# Patient Record
Sex: Male | Born: 1945
Health system: Southern US, Community
[De-identification: ages and names within clinical notes are randomized; demographics above are authoritative.]

## PROBLEM LIST (undated history)

## (undated) DIAGNOSIS — I509 Heart failure, unspecified: Secondary | ICD-10-CM

## (undated) DIAGNOSIS — E785 Hyperlipidemia, unspecified: Secondary | ICD-10-CM

## (undated) DIAGNOSIS — I10 Essential (primary) hypertension: Secondary | ICD-10-CM

## (undated) DIAGNOSIS — I4891 Unspecified atrial fibrillation: Secondary | ICD-10-CM

## (undated) DIAGNOSIS — D649 Anemia, unspecified: Secondary | ICD-10-CM

## (undated) DIAGNOSIS — N259 Disorder resulting from impaired renal tubular function, unspecified: Secondary | ICD-10-CM

## (undated) DIAGNOSIS — E119 Type 2 diabetes mellitus without complications: Secondary | ICD-10-CM

## (undated) DIAGNOSIS — I639 Cerebral infarction, unspecified: Secondary | ICD-10-CM

## (undated) DIAGNOSIS — H269 Unspecified cataract: Secondary | ICD-10-CM

## (undated) DIAGNOSIS — N184 Chronic kidney disease, stage 4 (severe): Secondary | ICD-10-CM

## (undated) HISTORY — DX: Unspecified cataract: H26.9

## (undated) HISTORY — DX: Cerebral infarction, unspecified: I63.9

## (undated) HISTORY — DX: Chronic kidney disease, stage 4 (severe): N18.4

## (undated) HISTORY — DX: Hyperlipidemia, unspecified: E78.5

## (undated) HISTORY — DX: Essential (primary) hypertension: I10

## (undated) HISTORY — PX: SHOULDER SURGERY: SHX246

## (undated) HISTORY — DX: Anemia, unspecified: D64.9

## (undated) HISTORY — PX: TRANSURETHRAL RESECTION OF PROSTATE: SHX73

## (undated) HISTORY — DX: Disorder resulting from impaired renal tubular function, unspecified: N25.9

## (undated) HISTORY — DX: Type 2 diabetes mellitus without complications: E11.9

---

## 2002-01-01 ENCOUNTER — Encounter: Admission: RE | Admit: 2002-01-01 | Discharge: 2002-04-01 | Payer: Self-pay | Admitting: Internal Medicine

## 2004-06-15 ENCOUNTER — Ambulatory Visit: Payer: Self-pay | Admitting: Internal Medicine

## 2004-06-22 ENCOUNTER — Ambulatory Visit: Payer: Self-pay | Admitting: Internal Medicine

## 2004-08-23 ENCOUNTER — Ambulatory Visit: Payer: Self-pay | Admitting: Internal Medicine

## 2004-08-30 ENCOUNTER — Ambulatory Visit: Payer: Self-pay | Admitting: Internal Medicine

## 2004-11-22 ENCOUNTER — Ambulatory Visit: Payer: Self-pay | Admitting: Internal Medicine

## 2004-11-30 ENCOUNTER — Ambulatory Visit: Payer: Self-pay | Admitting: Internal Medicine

## 2005-08-11 ENCOUNTER — Ambulatory Visit: Payer: Self-pay | Admitting: Internal Medicine

## 2005-08-24 ENCOUNTER — Ambulatory Visit: Payer: Self-pay | Admitting: Internal Medicine

## 2005-11-04 ENCOUNTER — Ambulatory Visit: Payer: Self-pay | Admitting: Internal Medicine

## 2005-11-14 ENCOUNTER — Ambulatory Visit: Payer: Self-pay

## 2005-11-16 ENCOUNTER — Ambulatory Visit: Payer: Self-pay | Admitting: Internal Medicine

## 2006-02-02 ENCOUNTER — Ambulatory Visit: Payer: Self-pay | Admitting: Internal Medicine

## 2006-02-14 ENCOUNTER — Ambulatory Visit: Payer: Self-pay | Admitting: Internal Medicine

## 2006-03-10 ENCOUNTER — Ambulatory Visit: Payer: Self-pay | Admitting: Internal Medicine

## 2006-03-16 ENCOUNTER — Ambulatory Visit: Payer: Self-pay | Admitting: Internal Medicine

## 2006-06-13 ENCOUNTER — Ambulatory Visit: Payer: Self-pay | Admitting: Internal Medicine

## 2006-06-13 LAB — CONVERTED CEMR LAB
ALT: 22 units/L (ref 0–40)
AST: 22 units/L (ref 0–37)
Albumin: 4.1 g/dL (ref 3.5–5.2)
Alkaline Phosphatase: 46 units/L (ref 39–117)
BUN: 16 mg/dL (ref 6–23)
Bilirubin, Direct: 0.1 mg/dL (ref 0.0–0.3)
CO2: 30 meq/L (ref 19–32)
Calcium: 10 mg/dL (ref 8.4–10.5)
Chloride: 101 meq/L (ref 96–112)
Chol/HDL Ratio, serum: 7
Cholesterol: 292 mg/dL (ref 0–200)
Creatinine, Ser: 1.6 mg/dL — ABNORMAL HIGH (ref 0.4–1.5)
GFR calc non Af Amer: 47 mL/min
Glomerular Filtration Rate, Af Am: 57 mL/min/{1.73_m2}
Glucose, Bld: 251 mg/dL — ABNORMAL HIGH (ref 70–99)
HDL: 42 mg/dL (ref >39.0)
Hgb A1c MFr Bld: 7.7 % — ABNORMAL HIGH (ref 4.6–6.0)
LDL DIRECT: 155.3 mg/dL
Potassium: 3.8 meq/L (ref 3.5–5.1)
Sodium: 139 meq/L (ref 135–145)
Total Bilirubin: 0.9 mg/dL (ref 0.3–1.2)
Total Protein: 6.8 g/dL (ref 6.0–8.3)
Triglyceride fasting, serum: 323 mg/dL (ref 0–149)
VLDL: 65 mg/dL — ABNORMAL HIGH (ref 0–40)

## 2006-06-20 ENCOUNTER — Ambulatory Visit: Payer: Self-pay | Admitting: Internal Medicine

## 2006-07-26 ENCOUNTER — Ambulatory Visit: Payer: Self-pay | Admitting: Internal Medicine

## 2006-07-26 LAB — CONVERTED CEMR LAB
ALT: 22 units/L (ref 0–40)
AST: 29 units/L (ref 0–37)
Albumin: 3.9 g/dL (ref 3.5–5.2)
Alkaline Phosphatase: 49 units/L (ref 39–117)
BUN: 11 mg/dL (ref 6–23)
Bilirubin, Direct: 0.1 mg/dL (ref 0.0–0.3)
CO2: 30 meq/L (ref 19–32)
Calcium: 9.7 mg/dL (ref 8.4–10.5)
Chloride: 103 meq/L (ref 96–112)
Cholesterol: 257 mg/dL (ref 0–200)
Creatinine, Ser: 1.4 mg/dL (ref 0.4–1.5)
Direct LDL: 150.7 mg/dL
GFR calc Af Amer: 67 mL/min
GFR calc non Af Amer: 55 mL/min
Glucose, Bld: 199 mg/dL — ABNORMAL HIGH (ref 70–99)
HDL: 37.8 mg/dL — ABNORMAL LOW (ref >39.0)
Potassium: 4 meq/L (ref 3.5–5.1)
Sodium: 140 meq/L (ref 135–145)
Total Bilirubin: 0.7 mg/dL (ref 0.3–1.2)
Total CHOL/HDL Ratio: 6.8
Total Protein: 6.6 g/dL (ref 6.0–8.3)
Triglycerides: 214 mg/dL (ref 0–149)
VLDL: 43 mg/dL — ABNORMAL HIGH (ref 0–40)

## 2006-08-02 ENCOUNTER — Ambulatory Visit: Payer: Self-pay | Admitting: Internal Medicine

## 2006-08-08 ENCOUNTER — Ambulatory Visit: Payer: Self-pay | Admitting: Internal Medicine

## 2006-08-10 ENCOUNTER — Ambulatory Visit: Payer: Self-pay | Admitting: Internal Medicine

## 2006-08-15 ENCOUNTER — Ambulatory Visit: Payer: Self-pay | Admitting: Internal Medicine

## 2006-08-18 ENCOUNTER — Ambulatory Visit: Payer: Self-pay | Admitting: Gastroenterology

## 2006-08-21 ENCOUNTER — Ambulatory Visit: Payer: Self-pay | Admitting: Endocrinology

## 2006-09-01 ENCOUNTER — Ambulatory Visit: Payer: Self-pay | Admitting: Gastroenterology

## 2006-09-01 ENCOUNTER — Encounter (INDEPENDENT_AMBULATORY_CARE_PROVIDER_SITE_OTHER): Payer: Self-pay | Admitting: Specialist

## 2006-09-01 LAB — HM COLONOSCOPY: HM Colonoscopy: NORMAL

## 2006-09-05 ENCOUNTER — Ambulatory Visit: Payer: Self-pay | Admitting: Internal Medicine

## 2006-09-06 ENCOUNTER — Encounter: Payer: Self-pay | Admitting: Internal Medicine

## 2007-01-02 DIAGNOSIS — I10 Essential (primary) hypertension: Secondary | ICD-10-CM

## 2007-01-02 DIAGNOSIS — E119 Type 2 diabetes mellitus without complications: Secondary | ICD-10-CM

## 2007-01-02 DIAGNOSIS — I129 Hypertensive chronic kidney disease with stage 1 through stage 4 chronic kidney disease, or unspecified chronic kidney disease: Secondary | ICD-10-CM

## 2007-01-02 HISTORY — DX: Type 2 diabetes mellitus without complications: E11.9

## 2007-01-02 HISTORY — DX: Essential (primary) hypertension: I10

## 2007-03-15 ENCOUNTER — Encounter: Payer: Self-pay | Admitting: Endocrinology

## 2007-04-04 ENCOUNTER — Encounter: Payer: Self-pay | Admitting: Internal Medicine

## 2007-04-12 ENCOUNTER — Ambulatory Visit: Payer: Self-pay | Admitting: Internal Medicine

## 2007-05-21 ENCOUNTER — Ambulatory Visit (HOSPITAL_BASED_OUTPATIENT_CLINIC_OR_DEPARTMENT_OTHER): Admission: RE | Admit: 2007-05-21 | Discharge: 2007-05-21 | Payer: Self-pay | Admitting: Urology

## 2007-05-21 ENCOUNTER — Encounter (INDEPENDENT_AMBULATORY_CARE_PROVIDER_SITE_OTHER): Payer: Self-pay | Admitting: Urology

## 2007-06-18 ENCOUNTER — Ambulatory Visit: Payer: Self-pay | Admitting: Internal Medicine

## 2007-06-18 DIAGNOSIS — E785 Hyperlipidemia, unspecified: Secondary | ICD-10-CM

## 2007-06-18 HISTORY — DX: Hyperlipidemia, unspecified: E78.5

## 2007-06-18 LAB — CONVERTED CEMR LAB
ALT: 17 units/L (ref 0–53)
AST: 19 units/L (ref 0–37)
Albumin: 3.9 g/dL (ref 3.5–5.2)
Alkaline Phosphatase: 46 units/L (ref 39–117)
BUN: 17 mg/dL (ref 6–23)
Bilirubin, Direct: 0.1 mg/dL (ref 0.0–0.3)
CO2: 31 meq/L (ref 19–32)
Calcium: 9.7 mg/dL (ref 8.4–10.5)
Chloride: 99 meq/L (ref 96–112)
Cholesterol: 286 mg/dL (ref 0–200)
Creatinine, Ser: 1.6 mg/dL — ABNORMAL HIGH (ref 0.4–1.5)
Creatinine,U: 111.2 mg/dL
Direct LDL: 146.7 mg/dL
GFR calc Af Amer: 57 mL/min
GFR calc non Af Amer: 47 mL/min
Glucose, Bld: 306 mg/dL — ABNORMAL HIGH (ref 70–99)
HDL: 37.9 mg/dL — ABNORMAL LOW (ref >39.0)
Hgb A1c MFr Bld: 9.2 % — ABNORMAL HIGH (ref 4.6–6.0)
Microalb Creat Ratio: 6.3 mg/g (ref 0.0–30.0)
Microalb, Ur: 0.7 mg/dL (ref 0.0–1.9)
Potassium: 3.8 meq/L (ref 3.5–5.1)
Sodium: 138 meq/L (ref 135–145)
Total Bilirubin: 0.8 mg/dL (ref 0.3–1.2)
Total CHOL/HDL Ratio: 7.5
Total Protein: 6.7 g/dL (ref 6.0–8.3)
Triglycerides: 344 mg/dL (ref 0–149)
VLDL: 69 mg/dL — ABNORMAL HIGH (ref 0–40)

## 2007-06-25 ENCOUNTER — Ambulatory Visit: Payer: Self-pay | Admitting: Internal Medicine

## 2007-09-17 ENCOUNTER — Encounter: Payer: Self-pay | Admitting: Family Medicine

## 2007-09-18 ENCOUNTER — Ambulatory Visit: Payer: Self-pay | Admitting: Internal Medicine

## 2007-09-18 LAB — CONVERTED CEMR LAB
ALT: 19 units/L (ref 0–53)
AST: 19 units/L (ref 0–37)
Albumin: 3.3 g/dL — ABNORMAL LOW (ref 3.5–5.2)
Alkaline Phosphatase: 61 units/L (ref 39–117)
BUN: 36 mg/dL — ABNORMAL HIGH (ref 6–23)
Bilirubin, Direct: 0.2 mg/dL (ref 0.0–0.3)
CO2: 31 meq/L (ref 19–32)
Calcium: 9.1 mg/dL (ref 8.4–10.5)
Chloride: 100 meq/L (ref 96–112)
Cholesterol: 205 mg/dL (ref 0–200)
Creatinine, Ser: 1.9 mg/dL — ABNORMAL HIGH (ref 0.4–1.5)
Direct LDL: 135.3 mg/dL
GFR calc Af Amer: 47 mL/min
GFR calc non Af Amer: 39 mL/min
Glucose, Bld: 203 mg/dL — ABNORMAL HIGH (ref 70–99)
HDL: 30 mg/dL — ABNORMAL LOW (ref >39.0)
Hgb A1c MFr Bld: 9 % — ABNORMAL HIGH (ref 4.6–6.0)
Potassium: 3.9 meq/L (ref 3.5–5.1)
Sodium: 139 meq/L (ref 135–145)
Total Bilirubin: 1 mg/dL (ref 0.3–1.2)
Total CHOL/HDL Ratio: 6.8
Total Protein: 6.7 g/dL (ref 6.0–8.3)
Triglycerides: 129 mg/dL (ref 0–149)
VLDL: 26 mg/dL (ref 0–40)

## 2007-09-25 ENCOUNTER — Ambulatory Visit: Payer: Self-pay | Admitting: Internal Medicine

## 2008-01-29 ENCOUNTER — Ambulatory Visit: Payer: Self-pay | Admitting: Internal Medicine

## 2008-01-30 LAB — CONVERTED CEMR LAB
Albumin: 3.5 g/dL (ref 3.5–5.2)
BUN: 35 mg/dL — ABNORMAL HIGH (ref 6–23)
Cholesterol: 212 mg/dL (ref 0–200)
Creatinine, Ser: 1.9 mg/dL — ABNORMAL HIGH (ref 0.4–1.5)
GFR calc Af Amer: 47 mL/min
GFR calc non Af Amer: 38 mL/min
HDL: 30.6 mg/dL — ABNORMAL LOW (ref >39.0)
Hgb A1c MFr Bld: 11.9 % — ABNORMAL HIGH (ref 4.6–6.0)
Potassium: 4 meq/L (ref 3.5–5.1)
Total CHOL/HDL Ratio: 6.9
VLDL: 51 mg/dL — ABNORMAL HIGH (ref 0–40)

## 2008-02-05 ENCOUNTER — Ambulatory Visit: Payer: Self-pay | Admitting: Internal Medicine

## 2008-02-05 DIAGNOSIS — N259 Disorder resulting from impaired renal tubular function, unspecified: Secondary | ICD-10-CM

## 2008-02-05 HISTORY — DX: Disorder resulting from impaired renal tubular function, unspecified: N25.9

## 2008-03-24 ENCOUNTER — Telehealth: Payer: Self-pay | Admitting: Internal Medicine

## 2008-05-26 ENCOUNTER — Ambulatory Visit: Payer: Self-pay | Admitting: Internal Medicine

## 2008-05-26 LAB — CONVERTED CEMR LAB
ALT: 11 units/L (ref 0–53)
BUN: 26 mg/dL — ABNORMAL HIGH (ref 6–23)
CO2: 31 meq/L (ref 19–32)
Calcium: 9.4 mg/dL (ref 8.4–10.5)
Cholesterol: 270 mg/dL (ref 0–200)
Creatinine, Ser: 1.9 mg/dL — ABNORMAL HIGH (ref 0.4–1.5)
Direct LDL: 146.7 mg/dL
Glucose, Bld: 160 mg/dL — ABNORMAL HIGH (ref 70–99)
Hgb A1c MFr Bld: 6.9 % — ABNORMAL HIGH (ref 4.6–6.0)
Total Protein: 6.9 g/dL (ref 6.0–8.3)
Triglycerides: 161 mg/dL — ABNORMAL HIGH (ref 0–149)

## 2008-06-11 ENCOUNTER — Ambulatory Visit: Payer: Self-pay | Admitting: Internal Medicine

## 2008-10-14 ENCOUNTER — Ambulatory Visit: Payer: Self-pay | Admitting: Internal Medicine

## 2008-10-14 LAB — CONVERTED CEMR LAB
Alkaline Phosphatase: 49 units/L (ref 39–117)
BUN: 27 mg/dL — ABNORMAL HIGH (ref 6–23)
Bilirubin, Direct: 0.1 mg/dL (ref 0.0–0.3)
Chloride: 108 meq/L (ref 96–112)
Creatinine, Ser: 2 mg/dL — ABNORMAL HIGH (ref 0.4–1.5)
Hgb A1c MFr Bld: 7.1 % — ABNORMAL HIGH (ref 4.6–6.5)
Total Protein: 7 g/dL (ref 6.0–8.3)
VLDL: 51.6 mg/dL — ABNORMAL HIGH (ref 0.0–40.0)

## 2008-10-21 ENCOUNTER — Ambulatory Visit: Payer: Self-pay | Admitting: Internal Medicine

## 2009-02-19 ENCOUNTER — Ambulatory Visit: Payer: Self-pay | Admitting: Internal Medicine

## 2009-02-19 LAB — CONVERTED CEMR LAB
ALT: 15 units/L (ref 0–53)
BUN: 35 mg/dL — ABNORMAL HIGH (ref 6–23)
Bilirubin, Direct: 0 mg/dL (ref 0.0–0.3)
CO2: 30 meq/L (ref 19–32)
Chloride: 102 meq/L (ref 96–112)
Glucose, Bld: 150 mg/dL — ABNORMAL HIGH (ref 70–99)
HDL: 33.1 mg/dL — ABNORMAL LOW (ref >39.00)
Potassium: 3.5 meq/L (ref 3.5–5.1)
Total Bilirubin: 0.9 mg/dL (ref 0.3–1.2)
VLDL: 39.4 mg/dL (ref 0.0–40.0)

## 2009-02-26 ENCOUNTER — Ambulatory Visit: Payer: Self-pay | Admitting: Internal Medicine

## 2009-03-10 ENCOUNTER — Telehealth: Payer: Self-pay | Admitting: Internal Medicine

## 2009-05-14 ENCOUNTER — Ambulatory Visit: Payer: Self-pay | Admitting: Internal Medicine

## 2009-05-14 LAB — CONVERTED CEMR LAB
Bilirubin Urine: NEGATIVE
Ketones, urine, test strip: NEGATIVE
Protein, U semiquant: >=300
Urobilinogen, UA: 0.2

## 2009-05-18 ENCOUNTER — Telehealth: Payer: Self-pay | Admitting: Internal Medicine

## 2009-05-19 ENCOUNTER — Ambulatory Visit: Payer: Self-pay | Admitting: Internal Medicine

## 2009-05-20 ENCOUNTER — Ambulatory Visit: Payer: Self-pay | Admitting: Internal Medicine

## 2009-07-10 ENCOUNTER — Ambulatory Visit: Payer: Self-pay | Admitting: Internal Medicine

## 2009-07-10 LAB — CONVERTED CEMR LAB
Albumin: 3.7 g/dL (ref 3.5–5.2)
CO2: 28 meq/L (ref 19–32)
Calcium: 9.2 mg/dL (ref 8.4–10.5)
Chloride: 105 meq/L (ref 96–112)
Cholesterol: 263 mg/dL — ABNORMAL HIGH (ref 0–200)
Direct LDL: 152.4 mg/dL
Hgb A1c MFr Bld: 9.6 % — ABNORMAL HIGH (ref 4.6–6.5)
Sodium: 141 meq/L (ref 135–145)
Total CHOL/HDL Ratio: 7
Total Protein: 7.2 g/dL (ref 6.0–8.3)
Triglycerides: 252 mg/dL — ABNORMAL HIGH (ref 0.0–149.0)
VLDL: 50.4 mg/dL — ABNORMAL HIGH (ref 0.0–40.0)

## 2009-07-17 ENCOUNTER — Ambulatory Visit: Payer: Self-pay | Admitting: Internal Medicine

## 2009-07-17 LAB — CONVERTED CEMR LAB
Iron: 77 ug/dL (ref 42–165)
Saturation Ratios: 20.4 % (ref 20.0–50.0)
Transferrin: 269.8 mg/dL (ref 212.0–360.0)

## 2009-07-20 LAB — CONVERTED CEMR LAB
Basophils Absolute: 0.1 10*3/uL (ref 0.0–0.1)
Eosinophils Absolute: 0.4 10*3/uL (ref 0.0–0.7)
Hemoglobin: 11.5 g/dL — ABNORMAL LOW (ref 13.0–17.0)
Lymphocytes Relative: 26.6 % (ref 12.0–46.0)
Monocytes Relative: 6.1 % (ref 3.0–12.0)
Neutro Abs: 3.9 10*3/uL (ref 1.4–7.7)
Neutrophils Relative %: 60.5 % (ref 43.0–77.0)
PSA: 1.32 ng/mL (ref 0.10–4.00)
RBC: 4.35 M/uL (ref 4.22–5.81)
RDW: 18.1 % — ABNORMAL HIGH (ref 11.5–14.6)
TSH: 1.88 microintl units/mL (ref 0.35–5.50)

## 2009-09-07 LAB — PSA: PSA: 2.88

## 2009-09-17 ENCOUNTER — Encounter: Payer: Self-pay | Admitting: Internal Medicine

## 2009-11-06 ENCOUNTER — Ambulatory Visit: Payer: Self-pay | Admitting: Internal Medicine

## 2009-11-06 LAB — CONVERTED CEMR LAB
CO2: 29 meq/L (ref 19–32)
Chloride: 104 meq/L (ref 96–112)
Direct LDL: 135.2 mg/dL
HDL: 37.1 mg/dL — ABNORMAL LOW (ref >39.00)
Potassium: 3.4 meq/L — ABNORMAL LOW (ref 3.5–5.1)
Sodium: 140 meq/L (ref 135–145)
Total CHOL/HDL Ratio: 7
Total Protein: 7 g/dL (ref 6.0–8.3)
VLDL: 54.8 mg/dL — ABNORMAL HIGH (ref 0.0–40.0)

## 2009-11-13 ENCOUNTER — Ambulatory Visit: Payer: Self-pay | Admitting: Internal Medicine

## 2009-11-20 ENCOUNTER — Ambulatory Visit: Payer: Self-pay | Admitting: Internal Medicine

## 2009-11-20 LAB — CONVERTED CEMR LAB
Blood in Urine, dipstick: NEGATIVE
Glucose, Urine, Semiquant: NEGATIVE
Ketones, urine, test strip: NEGATIVE
Specific Gravity, Urine: 1.01
pH: 5.5

## 2010-03-04 ENCOUNTER — Ambulatory Visit: Payer: Self-pay | Admitting: Internal Medicine

## 2010-03-04 LAB — CONVERTED CEMR LAB
AST: 17 units/L (ref 0–37)
Albumin: 3.8 g/dL (ref 3.5–5.2)
BUN: 32 mg/dL — ABNORMAL HIGH (ref 6–23)
GFR calc non Af Amer: 41.19 mL/min (ref >60)
Glucose, Bld: 213 mg/dL — ABNORMAL HIGH (ref 70–99)
HDL: 36.9 mg/dL — ABNORMAL LOW (ref >39.00)
Hgb A1c MFr Bld: 8.7 % — ABNORMAL HIGH (ref 4.6–6.5)
Potassium: 4 meq/L (ref 3.5–5.1)
Total Protein: 6.8 g/dL (ref 6.0–8.3)
Triglycerides: 294 mg/dL — ABNORMAL HIGH (ref 0.0–149.0)

## 2010-03-16 ENCOUNTER — Ambulatory Visit: Payer: Self-pay | Admitting: Internal Medicine

## 2010-03-16 ENCOUNTER — Telehealth: Payer: Self-pay | Admitting: Internal Medicine

## 2010-03-18 ENCOUNTER — Telehealth: Payer: Self-pay | Admitting: Internal Medicine

## 2010-03-19 ENCOUNTER — Telehealth: Payer: Self-pay | Admitting: Internal Medicine

## 2010-07-06 ENCOUNTER — Ambulatory Visit
Admission: RE | Admit: 2010-07-06 | Discharge: 2010-07-06 | Payer: Self-pay | Source: Home / Self Care | Attending: Internal Medicine | Admitting: Internal Medicine

## 2010-07-06 LAB — CONVERTED CEMR LAB
AST: 22 units/L (ref 0–37)
Albumin: 3.7 g/dL (ref 3.5–5.2)
Alkaline Phosphatase: 50 units/L (ref 39–117)
BUN: 26 mg/dL — ABNORMAL HIGH (ref 6–23)
CO2: 28 meq/L (ref 19–32)
Calcium: 9.2 mg/dL (ref 8.4–10.5)
Cholesterol: 246 mg/dL — ABNORMAL HIGH (ref 0–200)
Creatinine, Ser: 2 mg/dL — ABNORMAL HIGH (ref 0.4–1.5)
Direct LDL: 148.7 mg/dL
Glucose, Bld: 213 mg/dL — ABNORMAL HIGH (ref 70–99)
Total CHOL/HDL Ratio: 7

## 2010-07-14 ENCOUNTER — Encounter: Payer: Self-pay | Admitting: Internal Medicine

## 2010-07-14 ENCOUNTER — Ambulatory Visit: Payer: 59 | Admitting: Internal Medicine

## 2010-07-14 ENCOUNTER — Ambulatory Visit
Admission: RE | Admit: 2010-07-14 | Discharge: 2010-07-14 | Payer: Self-pay | Source: Home / Self Care | Attending: Internal Medicine | Admitting: Internal Medicine

## 2010-07-14 DIAGNOSIS — N411 Chronic prostatitis: Secondary | ICD-10-CM | POA: Insufficient documentation

## 2010-07-14 LAB — CONVERTED CEMR LAB
Nitrite: NEGATIVE
Protein, U semiquant: >=300
Urobilinogen, UA: 0.2

## 2010-07-14 MED ORDER — CIPROFLOXACIN HCL 500 MG PO TABS: 500.0000 mg | ORAL_TABLET | Freq: Two times a day (BID) | ORAL | Status: AC

## 2010-07-14 NOTE — Assessment & Plan Note (Signed)
Improving A1c Encouraged to continue exercise, and follow low calorie diet

## 2010-07-14 NOTE — Progress Notes (Signed)
Subjective:     Patient ID: [name] [name] is a 65 y.o. male.  HPI Hx of uti---24 hour hx of uti sxs---dysuria, hematuria and pain  DM---tolerating meds without difficulty HTN--tolerating meds Lipids--tolerating meds  He is exercising some, trying to follow a diet. The following portions of the patient's history were reviewed and updated as appropriate: allergies, current medications, past family history, past medical history, past social history, past surgical history and problem list.  Review of Systems     Objective:   Physical Exam     Assessment:

## 2010-07-15 ENCOUNTER — Encounter: Payer: Self-pay | Admitting: Internal Medicine

## 2010-08-05 NOTE — Progress Notes (Signed)
Summary: Pt says Caverject Kit is not carried at [facility] or [facility]  Phone Note Call from Patient Call back at [facility] Phone [phone] Call back at Work Phone [phone]   Caller: Patient Summary of Call: Pt called and said that [facility] and [facility] do not carry the Caverject Kit. Pharmacys recommended that pt use Prostaglandin 5mcg / ML Papaverine 30mg / and  Phentolamine 1mg  all per ml. Pls call in to [facility]. [phone] Initial call taken by: Braulio Bosch,  March 18, 2010 4:39 PM  Follow-up for Phone Call        ok Follow-up by: Phoebe Sharps MD,  March 19, 2010 11:06 AM  Additional Follow-up for Phone Call Additional follow up Details #1::        Phone Call Completed Additional Follow-up by: Shelbie Hutching, RN,  March 19, 2010 11:54 AM    New/Updated Medications: CAVERJECT IMPULSE 10 MCG KIT (ALPROSTADIL (VASODILATOR)) use as directed prior to intercourse

## 2010-08-05 NOTE — Assessment & Plan Note (Signed)
Summary: 4 month fup//ccm   Vital Signs:  Patient profile:   65 year old male Weight:      257 pounds Temp:     99.1 degrees F oral Pulse rate:   84 / minute Pulse rhythm:   regular BP sitting:   152 / 84  (left arm) Cuff size:   large  Vitals Entered By: Townsend Roger, [place] (July 14, 2010 9:48 AM) CC: 77mth f/u, dysuria   CC:  35mth f/u and dysuria.  History of Present Illness:  Follow-Up Visit      This is a 65 year old man who presents for Follow-up visit.  The patient denies chest pain and palpitations.  Since the last visit the patient notes no new problems or concerns.  The patient reports taking meds as prescribed, not monitoring BP, not monitoring blood sugars, dietary noncompliance, and exercising occasionaly.  When questioned about possible medication side effects, the patient notes none.    All other systems reviewed and were negative   Current Problems (verified): 1)  Renal Insufficiency  (ICD-588.9) 2)  Hyperlipidemia  (ICD-272.4) 3)  Hypertension  (ICD-401.9) 4)  Diabetes Mellitus, Type II  (ICD-250.00)  Current Medications (verified): 1)  Lotrel 10-40 Mg Caps (Amlodipine Besy-Benazepril Hcl) .... Take 1 Capsule By Mouth Once A Day 2)  Atenolol 50 Mg Tabs (Atenolol) .Marland Kitchen.. 1 By Mouth 2 Times Daily 3)  Glyburide 2.5 Mg Tabs (Glyburide) .... Take 1 Tablet By Mouth Two Times A Day 4)  Januvia 100 Mg Tabs (Sitagliptin Phosphate) .... Take 1 Tab By Mouth Daily  Allergies (verified): 1)  Penicillin G Potassium (Penicillin G Potassium)  Past History:  Past Medical History: Last updated: 02/05/2008 Diabetes mellitus, type II Hypertension Elevated cholesterol Renal insufficiency  Past Surgical History: Last updated: 01/02/2007 Shoulder sx Colonoscopy-09/01/2006  Family History: Last updated: 01/02/2007 Family History Diabetes 1st degree relative  Social History: Last updated: 01/02/2007 Retired Never Smoked  Risk Factors: Smoking Status: never  (11/13/2009)  Physical Exam  General:  alert and well-developed.   Head:  normocephalic and atraumatic.   Eyes:  pupils equal and pupils round.   Ears:  R ear normal and L ear normal.   Neck:  No deformities, masses, or tenderness noted. Lungs:  normal respiratory effort and no intercostal retractions.   Abdomen:  soft and non-tender.   Msk:  No deformity or scoliosis noted of thoracic or lumbar spine.   Neurologic:  alert & oriented X3 and cranial nerves II-XII intact.    Diabetes Management Exam:    Foot Exam (with socks and/or shoes not present):       Sensory-Monofilament:          Right foot: diminished    Eye Exam:       Eye Exam done elsewhere          Date: 07/04/2010          Results: normalpt's report          Done by: ophthal   Impression & Recommendations:  Problem # 1:  HYPERLIPIDEMIA (ICD-272.4) not controlled intolerant to statins Labs Reviewed: SGOT: 22 (07/06/2010)   SGPT: 17 (07/06/2010)   HDL:36.00 (07/06/2010), 36.90 (03/04/2010)  LDL:DEL (05/26/2008), DEL (01/29/2008)  Chol:246 (07/06/2010), 260 (03/04/2010)  Trig:219.0 (07/06/2010), 294.0 (03/04/2010)  Problem # 2:  RENAL INSUFFICIENCY (ICD-588.9) will continue to monitor  Problem # 3:  HYPERTENSION (ICD-401.9) i'd like him to address with aggressive weight loss His updated medication list for this problem includes:  Lotrel 10-40 Mg Caps (Amlodipine besy-benazepril hcl) .Marland Kitchen... Take 1 capsule by mouth once a day    Atenolol 50 Mg Tabs (Atenolol) .Marland Kitchen... 1 by mouth 2 times daily  BP today: 152/84 Prior BP: 174/94 (03/16/2010)  Labs Reviewed: K+: 3.6 (07/06/2010) Creat: : 2.0 (07/06/2010)   Chol: 246 (07/06/2010)   HDL: 36.00 (07/06/2010)   LDL: DEL (05/26/2008)   TG: 219.0 (07/06/2010)  Problem # 4:  DIABETES MELLITUS, TYPE II (ICD-250.00) Assessment: Deteriorated likely related to dietary noncompliance needs to lose weight note creatinine---needs better bp control---adress with weight  loss His updated medication list for this problem includes:    Lotrel 10-40 Mg Caps (Amlodipine besy-benazepril hcl) .Marland Kitchen... Take 1 capsule by mouth once a day    Glyburide 2.5 Mg Tabs (Glyburide) .Marland Kitchen... Take 1 tablet by mouth two times a day    Januvia 100 Mg Tabs (Sitagliptin phosphate) .Marland Kitchen... Take 1 tab by mouth daily  Labs Reviewed: Creat: 2.0 (07/06/2010)     Last Eye Exam: normalpt's report (07/04/2010) Reviewed HgBA1c results: 7.9 (07/06/2010)  8.7 (03/04/2010)  Complete Medication List: 1)  Lotrel 10-40 Mg Caps (Amlodipine besy-benazepril hcl) .... Take 1 capsule by mouth once a day 2)  Atenolol 50 Mg Tabs (Atenolol) .Marland Kitchen.. 1 by mouth 2 times daily 3)  Glyburide 2.5 Mg Tabs (Glyburide) .... Take 1 tablet by mouth two times a day 4)  Januvia 100 Mg Tabs (Sitagliptin phosphate) .... Take 1 tab by mouth daily  Other Orders: UA Dipstick w/o Micro (automated)  (81003) Specimen Handling (99000) T-Culture, Urine [id number] Urology Referral (Urology)  Patient Instructions: 1)  see me 4 months 2)  labs one week prior to visit 3)  lipids---272.4 4)  lfts-995.2 5)  bmet-995.2 6)  A1C-250.02 7)     Prescriptions: CIPRO 500 MG TABS (CIPROFLOXACIN HCL) 1 by mouth 2 times daily  #20 x 0   Entered and Authorized by:   Phoebe Sharps MD   Signed by:   Phoebe Sharps MD on 07/14/2010   Method used:   Electronically to        [facility] [address] # [phone]* (retail)       [address]       [place], [place]  [address]       Ph: [phone]       Fax: [phone]   RxID:   [id number]    Orders Added: 1)  UA Dipstick w/o Micro (automated)  [81003] 2)  Specimen Handling [99000] 3)  Est. Patient Level IV [id number] 4)  T-Culture, Urine [id number] 5)  Urology Referral [Urology]    Laboratory Results   Urine Tests  Date/Time Received: July 14, 2010 9:54 AM  Routine Urinalysis   Color: yellow Appearance: Cloudy Glucose: negative   (Normal Range: Negative) Bilirubin:  negative   (Normal Range: Negative) Ketone: negative   (Normal Range: Negative) Spec. Gravity: 1.015   (Normal Range: 1.003-1.035) Blood: large   (Normal Range: Negative) pH: 5.5   (Normal Range: 5.0-8.0) Protein: >=300   (Normal Range: Negative) Urobilinogen: 0.2   (Normal Range: 0-1) Nitrite: negative   (Normal Range: Negative) Leukocyte Esterace: large   (Normal Range: Negative)    Comments: Townsend Roger, CMA  July 14, 2010 9:59 AM

## 2010-08-05 NOTE — Assessment & Plan Note (Signed)
Summary: 4 month rov/njr   Vital Signs:  Patient profile:   65 year old male Weight:      247 pounds BMI:     31.83 Temp:     98.7 degrees F oral Pulse rate:   58 / minute Pulse rhythm:   regular Resp:     12 per minute BP sitting:   188 / 102  (left arm) Cuff size:   regular  Vitals Entered By: Rica Records, RN (Nov 13, 2009 9:37 AM) CC: 4 month rov, labs done--CBGs 150-180 at home Is Patient Diabetic? Yes Did you bring your meter with you today? No   CC:  4 month rov and labs done--CBGs 150-180 at home.  History of Present Illness:  Follow-Up Visit      This is a 65 year old man who presents for Follow-up visit.  The patient denies chest pain and palpitations.  Since the last visit the patient notes no new problems or concerns.  The patient reports taking meds as prescribed.  When questioned about possible medication side effects, the patient notes none.   complains of urinary frequency and urgency  All other systems reviewed and were negative  admits to poor dietary compliance   Preventive Screening-Counseling & Management  Alcohol-Tobacco     Smoking Status: never  Current Problems (verified): 1)  Renal Insufficiency  (ICD-588.9) 2)  Hyperlipidemia  (ICD-272.4) 3)  Hypertension  (ICD-401.9) 4)  Diabetes Mellitus, Type II  (ICD-250.00)  Current Medications (verified): 1)  Atenolol-Chlorthalidone 100-25 Mg Tabs (Atenolol-Chlorthalidone) .... Take 1 Tablet By Mouth Once A Day 2)  Zetia 10 Mg Tabs (Ezetimibe) .... Take 1 Tablet By Mouth Once A Day 3)  Co Q-10 300 Mg  Caps (Coenzyme Q10) .Marland Kitchen.. 1 By Mouth Two Times A Day 4)  Lipitor 20 Mg Tabs (Atorvastatin Calcium) .... Take 1/2 Tab By Mouth Every Other Day 5)  Glipizide-Metformin Hcl 5-500 Mg Tabs (Glipizide-Metformin Hcl) .... Take 1 Tablet By Mouth Two Times A Day 6)  Lotrel 10-40 Mg Caps (Amlodipine Besy-Benazepril Hcl) .... Take 1 Capsule By Mouth Once A Day  Allergies: 1)  Penicillin G Potassium (Penicillin G  Potassium)  Physical Exam  General:  Well-developed,well-nourished,in no acute distress; alert,appropriate and cooperative throughout examination;  160/84 Head:  normocephalic and atraumatic.   Neck:  No deformities, masses, or tenderness noted. Chest Wall:  No deformities, masses, tenderness or gynecomastia noted. Lungs:  normal respiratory effort and no intercostal retractions.   Heart:  normal rate and regular rhythm.   Abdomen:  soft and non-tender.   Msk:  No deformity or scoliosis noted of thoracic or lumbar spine.   Neurologic:  alert & oriented X3 and cranial nerves II-XII intact.   Skin:  turgor normal and color normal.     Impression & Recommendations:  Problem # 1:  RENAL INSUFFICIENCY (ICD-588.9) reviewed labs note recent UTI---sxs better note on metformin---need to stop  Problem # 2:  HYPERLIPIDEMIA (ICD-272.4) controlled continue current medications  His updated medication list for this problem includes:    Zetia 10 Mg Tabs (Ezetimibe) .Marland Kitchen... Take 1 tablet by mouth once a day    Lipitor 20 Mg Tabs (Atorvastatin calcium) .Marland Kitchen... Take 1/2 tab by mouth every other day  Labs Reviewed: SGOT: 19 (11/06/2009)   SGPT: 14 (11/06/2009)   HDL:37.10 (11/06/2009), 38.40 (07/10/2009)  LDL:DEL (05/26/2008), DEL (01/29/2008)  Chol:243 (11/06/2009), 263 (07/10/2009)  Trig:274.0 (11/06/2009), 252.0 (07/10/2009)  Problem # 3:  DIABETES MELLITUS, TYPE II (ICD-250.00) renal insuff  on metformin--will stop His updated medication list for this problem includes:    Glipizide 5 Mg Tb24 (Glipizide) .Marland Kitchen... Take 1 tablet by mouth two times a day with food    Lotrel 10-40 Mg Caps (Amlodipine besy-benazepril hcl) .Marland Kitchen... Take 1 capsule by mouth once a day  Problem # 4:  FREQUENCY, URINARY (ICD-788.41) ? cause reviewed urology note triad toviaz---3-4 week trial--no relief has urolgy f/u in June  Problem # 5:  HYPERTENSION (ICD-401.9) not controlled will change meds (chlorthalidone bprobably  has no effect) His updated medication list for this problem includes:    Labetalol Hcl 300 Mg Tabs (Labetalol hcl) .Marland Kitchen... Take 1 tab by mouth twice a day    Lotrel 10-40 Mg Caps (Amlodipine besy-benazepril hcl) .Marland Kitchen... Take 1 capsule by mouth once a day  Complete Medication List: 1)  Labetalol Hcl 300 Mg Tabs (Labetalol hcl) .... Take 1 tab by mouth twice a day 2)  Zetia 10 Mg Tabs (Ezetimibe) .... Take 1 tablet by mouth once a day 3)  Co Q-10 300 Mg Caps (Coenzyme q10) .Marland Kitchen.. 1 by mouth two times a day 4)  Lipitor 20 Mg Tabs (Atorvastatin calcium) .... Take 1/2 tab by mouth every other day 5)  Glipizide 5 Mg Tb24 (Glipizide) .... Take 1 tablet by mouth two times a day with food 6)  Lotrel 10-40 Mg Caps (Amlodipine besy-benazepril hcl) .... Take 1 capsule by mouth once a day  Patient Instructions: 1)  Please schedule a follow-up appointment in 4 months. 2)  It is important that you exercise regularly at least 20 minutes 5 times a week. If you develop chest pain, have severe difficulty breathing, or feel very tired , stop exercising immediately and seek medical attention. 3)  You need to lose weight. Consider a lower calorie diet and regular exercise.  Prescriptions: LABETALOL HCL 300 MG  TABS (LABETALOL HCL) Take 1 tab by mouth twice a day  #180 x 3   Entered and Authorized by:   Phoebe Sharps MD   Signed by:   Phoebe Sharps MD on 11/13/2009   Method used:   Electronically to        [facility] # [phone]* (retail)       [address]       [place], [place]  [address]       Ph: [phone]       Fax: [phone]   RxID:   [id number] GLIPIZIDE 5 MG  TB24 (GLIPIZIDE) Take 1 tablet by mouth two times a day with food  #180 x 3   Entered and Authorized by:   Phoebe Sharps MD   Signed by:   Phoebe Sharps MD on 11/13/2009   Method used:   Electronically to        [facility] # [phone]* (retail)       [address]       [place], [place]  [address]       Ph: [phone]       Fax:  [phone]   RxID:   [id number]    Preventive Care Screening  Colonoscopy:    Next Due:  09/2016  Appended Document: 4 month rov/njr   Immunizations Administered:  Tetanus Vaccine:    Vaccine Type: Tdap    Site: left deltoid    Mfr: GlaxoSmithKline    Dose: 0.5 ml    Route: IM    Given by: Rica Records, RN    Exp. Date: 09/26/2011    Lot #: [id number]

## 2010-08-05 NOTE — Assessment & Plan Note (Signed)
Summary: 4 month follow up/cjr   Vital Signs:  Patient profile:   65 year old male Weight:      251 pounds Temp:     98.7 degrees F oral BP sitting:   174 / 94  (left arm) Cuff size:   regular  Vitals Entered By: Westley Hummer CMA Deborra Medina) (March 16, 2010 9:35 AM)  CC: follow-up visit Is Patient Diabetic? Yes Did you bring your meter with you today? No Pain Assessment Patient in pain? no        CC:  follow-up visit.  History of Present Illness:  Follow-Up Visit      This is a 65 year old man who presents for Follow-up visit.  The patient denies chest pain and palpitations.  Since the last visit the patient notes no new problems or concerns.  The patient reports not taking meds as prescribed---see med list, not monitoring BP, not monitoring blood sugars, dietary noncompliance, and exercising on regular basis.  When questioned about possible medication side effects, the patient notes none.    All other systems reviewed and were negative   Current Problems (verified): 1)  Renal Insufficiency  (ICD-588.9) 2)  Hyperlipidemia  (ICD-272.4) 3)  Hypertension  (ICD-401.9) 4)  Diabetes Mellitus, Type II  (ICD-250.00)  Current Medications (verified): 1)  Lotrel 10-40 Mg Caps (Amlodipine Besy-Benazepril Hcl) .... Take 1 Capsule By Mouth Once A Day 2)  Atenolol 50 Mg Tabs (Atenolol) .Marland Kitchen.. 1 By Mouth 2 Times Daily 3)  Glipizide-Metformin Hcl 5-500 Mg Tabs (Glipizide-Metformin Hcl) .... Take 1 Tablet By Mouth Once A Day  Allergies (verified): 1)  Penicillin G Potassium (Penicillin G Potassium)  Past History:  Past Medical History: Last updated: 02/05/2008 Diabetes mellitus, type II Hypertension Elevated cholesterol Renal insufficiency  Past Surgical History: Last updated: 01/02/2007 Shoulder sx Colonoscopy-09/01/2006  Family History: Last updated: 01/02/2007 Family History Diabetes 1st degree relative  Social History: Last updated: 01/02/2007 Retired Never  Smoked  Risk Factors: Smoking Status: never (11/13/2009)  Physical Exam  General:  alert and well-developed.   Head:  normocephalic and atraumatic.   Eyes:  pupils equal and pupils round.   Ears:  R ear normal and L ear normal.   Neck:  No deformities, masses, or tenderness noted. Lungs:  normal respiratory effort and no intercostal retractions.   Heart:  normal rate and regular rhythm.   Abdomen:  soft and non-tender.   Skin:  turgor normal and color normal.   Psych:  normally interactive and good eye contact.     Impression & Recommendations:  Problem # 1:  DIABETES MELLITUS, TYPE II (ICD-250.00) not controlled. only on SU note renal insufficiency The following medications were removed from the medication list:    Glipizide 5 Mg Tb24 (Glipizide) .Marland Kitchen... Take 1 tablet by mouth two times a day with food His updated medication list for this problem includes:    Lotrel 10-40 Mg Caps (Amlodipine besy-benazepril hcl) .Marland Kitchen... Take 1 capsule by mouth once a day    Glyburide 2.5 Mg Tabs (Glyburide) .Marland Kitchen... Take 1 tablet by mouth two times a day    Januvia 100 Mg Tabs (Sitagliptin phosphate) .Marland Kitchen... Take 1 tab by mouth daily  Labs Reviewed: Creat: 2.1 (03/04/2010)     Last Eye Exam: normal-pt's eport (12/02/2008) Reviewed HgBA1c results: 8.7 (03/04/2010)  8.8 (11/06/2009)  Problem # 2:  HYPERTENSION (ICD-401.9) needs better control weight loss will help The following medications were removed from the medication list:    Labetalol Hcl 300  Mg Tabs (Labetalol hcl) .Marland Kitchen... Take 1 tab by mouth twice a day His updated medication list for this problem includes:    Lotrel 10-40 Mg Caps (Amlodipine besy-benazepril hcl) .Marland Kitchen... Take 1 capsule by mouth once a day    Atenolol 50 Mg Tabs (Atenolol) .Marland Kitchen... 1 by mouth 2 times daily  BP today: 174/94 Prior BP: 170/100 (11/20/2009)  Labs Reviewed: K+: 4.0 (03/04/2010) Creat: : 2.1 (03/04/2010)   Chol: 260 (03/04/2010)   HDL: 36.90 (03/04/2010)   LDL:  DEL (05/26/2008)   TG: 294.0 (03/04/2010)  Problem # 3:  HYPERLIPIDEMIA (ICD-272.4) not controlled not taking meds  Labs Reviewed: SGOT: 17 (03/04/2010)   SGPT: 11 (03/04/2010)   HDL:36.90 (03/04/2010), 37.10 (11/06/2009)  LDL:DEL (05/26/2008), DEL (01/29/2008)  Chol:260 (03/04/2010), 243 (11/06/2009)  Trig:294.0 (03/04/2010), 274.0 (11/06/2009)  Complete Medication List: 1)  Lotrel 10-40 Mg Caps (Amlodipine besy-benazepril hcl) .... Take 1 capsule by mouth once a day 2)  Atenolol 50 Mg Tabs (Atenolol) .Marland Kitchen.. 1 by mouth 2 times daily 3)  Glyburide 2.5 Mg Tabs (Glyburide) .... Take 1 tablet by mouth two times a day 4)  Januvia 100 Mg Tabs (Sitagliptin phosphate) .... Take 1 tab by mouth daily 5)  Caverject Impulse 10 Mcg Kit (Alprostadil (vasodilator)) .... Use as directed prior to intercourse  Other Orders: Urology Referral (Urology)  Patient Instructions: 1)  See your eye doctor yearly to check for diabetic eye damage. 2)  Please schedule a follow-up appointment in 4 months. 3)  labs one week prior to visit 4)  lipids---272.4 5)  lfts-995.2 6)  bmet-995.2 7)  A1C-250.02 8)     Prescriptions: CAVERJECT IMPULSE 10 MCG KIT (ALPROSTADIL (VASODILATOR)) use as directed prior to intercourse  #10 x 3   Entered and Authorized by:   Phoebe Sharps MD   Signed by:   Phoebe Sharps MD on 03/16/2010   Method used:   Electronically to        [facility] [address] # [phone]* (retail)       [address]       [place], [place]  [address]       Ph: [phone]       Fax: [phone]   RxID:   [id number] JANUVIA 100 MG TABS (SITAGLIPTIN PHOSPHATE) Take 1 tab by mouth daily  #30 x 3   Entered and Authorized by:   Phoebe Sharps MD   Signed by:   Phoebe Sharps MD on 03/16/2010   Method used:   Electronically to        [facility] [address] # [phone]* (retail)       [address]       [place], [place]  [address]       Ph: [phone]       Fax: [phone]   [id number] GLYBURIDE 2.5 MG  TABS (GLYBURIDE) Take 1 tablet by mouth two times a day  #180 x 3   Entered and Authorized by:   Phoebe Sharps MD   Signed by:   Phoebe Sharps MD on 03/16/2010   Method used:   Electronically to        [facility] [address] # [phone]* (retail)       [address]       [place], [place]  [address]       Ph: [phone]       Fax: [phone]   RxID:   [id number] ATENOLOL 50 MG TABS (ATENOLOL) 1 by mouth 2 times daily  #180 x 3  Entered and Authorized by:   Phoebe Sharps MD   Signed by:   Phoebe Sharps MD on 03/16/2010   Method used:   Historical   [id number]

## 2010-08-05 NOTE — Progress Notes (Signed)
Summary: caverject kit clarification  Phone Note From Pharmacy   Caller: [facility] # [phone]* Summary of Call: Pharmacy is calling because the caverject kit comes with 2 injections in 1 box.  They would like to know if you would like 20 injections or 5 boxes.  [id number] Initial call taken by: Westley Hummer CMA Deborra Medina),  March 16, 2010 12:17 PM  Follow-up for Phone Call        5 boxes Follow-up by: Phoebe Sharps MD,  March 16, 2010 3:39 PM    Prescriptions: CAVERJECT IMPULSE 10 MCG KIT (ALPROSTADIL (VASODILATOR)) use as directed prior to intercourse  #1 box x 44   Entered by:   Westley Hummer CMA ([place])   Authorized by:   Phoebe Sharps MD   Signed by:   Westley Hummer CMA ([place]) on 03/17/2010   Method used:   Electronically to        [facility] # [phone]* (retail)       [address]       [place], [place]  [address]       Ph: [phone]       Fax: [phone]   RxID:   [id number]

## 2010-08-05 NOTE — Progress Notes (Signed)
Summary: refill  Phone Note Refill Request Message from:  Fax from Pharmacy on March 19, 2010 4:56 PM  Refills Requested: Medication #1:  LOTREL 10-40 MG CAPS Take 1 capsule by mouth once a day    Prescriptions: LOTREL 10-40 MG CAPS (AMLODIPINE BESY-BENAZEPRIL HCL) Take 1 capsule by mouth once a day  #90 x 3   Entered by:   Westley Hummer CMA ([place])   Authorized by:   Phoebe Sharps MD   Signed by:   Westley Hummer CMA ([place]) on 03/19/2010   Method used:   Faxed to ...       [facility] # [phone]* (retail)       [address]       [place], [place]  [address]       Ph: [phone]       Fax: [phone]   RxID:   [id number]

## 2010-08-05 NOTE — Letter (Signed)
Summary: [facility]  [facility]   Imported By: Laural Benes 09/25/2009 13:17:09  _____________________________________________________________________  External Attachment:    Type:   Image     Comment:   External Document

## 2010-08-05 NOTE — Assessment & Plan Note (Signed)
Summary: rov/mm/PT RESCD//CCM/PT RSC/CJR   Vital Signs:  Patient profile:   65 year old male Weight:      250 pounds Temp:     98.4 degrees F Pulse rate:   60 / minute Resp:     12 per minute BP sitting:   160 / 98  (left arm)  Vitals Entered By: Rica Records, RN (July 17, 2009 11:33 AM)   History of Present Illness:  Follow-Up Visit      This is a 65 year old man who presents for Follow-up visit.  The patient denies chest pain, palpitations, dizziness, syncope, edema, SOB, DOE, PND, and orthopnea.  Since the last visit the patient notes no new problems or concerns.  The patient reports taking meds as prescribed.  When questioned about possible medication side effects, the patient notes none.    All other systems reviewed and were negative   Preventive Screening-Counseling & Management  Alcohol-Tobacco     Smoking Status: never  Allergies: 1)  Penicillin G Potassium (Penicillin G Potassium)  Comments:  Nurse/Medical Assistant: rov, labs done-- BP 152/86 couple days ago at pharmacy  The patient's medications and allergies were reviewed with the patient and were updated in the Medication and Allergy Lists. Rica Records, RN (July 17, 2009 11:35 AM)  Past History:  Past Medical History: Last updated: 02/05/2008 Diabetes mellitus, type II Hypertension Elevated cholesterol Renal insufficiency  Past Surgical History: Last updated: 01/02/2007 Shoulder sx Colonoscopy-09/01/2006  Family History: Last updated: 01/02/2007 Family History Diabetes 1st degree relative  Social History: Last updated: 01/02/2007 Retired Never Smoked  Risk Factors: Smoking Status: never (07/17/2009)  Review of Systems       All other systems reviewed and were negative   Physical Exam  General:  Well-developed,well-nourished,in no acute distress; alert,appropriate and cooperative throughout examination;  160/84 Head:  normocephalic and atraumatic.   Eyes:  pupils equal and pupils  round.   Ears:  R ear normal and L ear normal.   Neck:  No deformities, masses, or tenderness noted. Chest Wall:  No deformities, masses, tenderness or gynecomastia noted. Lungs:  Normal respiratory effort, chest expands symmetrically. Lungs are clear to auscultation, no crackles or wheezes. Heart:  Normal rate and regular rhythm. S1 and S2 normal without gallop, murmur, click, rub or other extra sounds. Abdomen:  no cva or suprapubic discomfort Msk:  No deformity or scoliosis noted of thoracic or lumbar spine.   Pulses:  R radial normal and L radial normal.   Neurologic:  cranial nerves II-XII intact and gait normal.   Psych:  memory intact for recent and remote and good eye contact.     Impression & Recommendations:  Problem # 1:  RENAL INSUFFICIENCY (ICD-588.9) continue to monitor  Problem # 2:  HYPERTENSION (ICD-401.9) noncompliant with diet exercise or weight loss His updated medication list for this problem includes:    Atenolol-chlorthalidone 100-25 Mg Tabs (Atenolol-chlorthalidone) .Marland Kitchen... Take 1 tablet by mouth once a day    Lotrel 10-40 Mg Caps (Amlodipine besy-benazepril hcl) .Marland Kitchen... Take 1 capsule by mouth once a day  BP today: 160/98 Prior BP: 128/82 (05/20/2009)  Labs Reviewed: K+: 3.3 (07/10/2009) Creat: : 1.8 (07/10/2009)   Chol: 263 (07/10/2009)   HDL: 38.40 (07/10/2009)   LDL: DEL (05/26/2008)   TG: 252.0 (07/10/2009)  Problem # 3:  HYPERLIPIDEMIA (ICD-272.4) not controlled need to try low dose statin His updated medication list for this problem includes:    Zetia 10 Mg Tabs (Ezetimibe) .Marland Kitchen... Take 1  tablet by mouth once a day    Lipitor 20 Mg Tabs (Atorvastatin calcium) .Marland Kitchen... Take 1/2 tab by mouth every other day  Labs Reviewed: SGOT: 18 (07/10/2009)   SGPT: 13 (07/10/2009)   HDL:38.40 (07/10/2009), 33.10 (02/19/2009)  LDL:DEL (05/26/2008), DEL (01/29/2008)  Chol:263 (07/10/2009), 256 (02/19/2009)  Trig:252.0 (07/10/2009), 197.0 (02/19/2009)  Problem # 4:   DIABETES MELLITUS, TYPE II (ICD-250.00)  not controlled has not been following diet, exercise His updated medication list for this problem includes:    Glipizide-metformin Hcl 5-500 Mg Tabs (Glipizide-metformin hcl) .Marland Kitchen... Take 1 tablet by mouth two times a day    Lotrel 10-40 Mg Caps (Amlodipine besy-benazepril hcl) .Marland Kitchen... Take 1 capsule by mouth once a day  Labs Reviewed: Creat: 1.8 (07/10/2009)     Last Eye Exam: normal-pt's eport (12/02/2008) Reviewed HgBA1c results: 9.6 (07/10/2009)  7.0 (02/19/2009)  Orders: TLB-TSH (Thyroid Stimulating Hormone) (84443-TSH) TLB-CBC Platelet - w/Differential (85025-CBCD)  Complete Medication List: 1)  Atenolol-chlorthalidone 100-25 Mg Tabs (Atenolol-chlorthalidone) .... Take 1 tablet by mouth once a day 2)  Zetia 10 Mg Tabs (Ezetimibe) .... Take 1 tablet by mouth once a day 3)  Co Q-10 300 Mg Caps (Coenzyme q10) .Marland Kitchen.. 1 by mouth two times a day 4)  Lipitor 20 Mg Tabs (Atorvastatin calcium) .... Take 1/2 tab by mouth every other day 5)  Glipizide-metformin Hcl 5-500 Mg Tabs (Glipizide-metformin hcl) .... Take 1 tablet by mouth two times a day 6)  Lotrel 10-40 Mg Caps (Amlodipine besy-benazepril hcl) .... Take 1 capsule by mouth once a day  Other Orders: TLB-PSA (Prostate Specific Antigen) (84153-PSA)  Patient Instructions: 1)  4 months 2)  labs one week prior to visit 3)  lipids---272.4 4)  lfts-995.2 5)  bmet-995.2 6)  A1C-250.02 7)

## 2010-08-05 NOTE — Assessment & Plan Note (Signed)
Summary: uti/dm   Vital Signs:  Patient profile:   65 year old male Weight:      248 pounds Temp:     99.2 degrees F oral BP sitting:   170 / 100  (left arm) Cuff size:   regular  Vitals Entered By: Westley Hummer CMA Deborra Medina) (Nov 20, 2009 11:02 AM)  Serial Vital Signs/Assessments:  Time      Position  BP       Pulse  Resp  Temp     By                     140/92                         Phoebe Sharps MD  CC: uti Is Patient Diabetic? Yes   CC:  uti.  History of Present Illness:  Follow-Up Visit      This is a 65 year old man who presents for Follow-up visit.  The patient denies chest pain and palpitations.  Since the last visit the patient notes no new problems or concerns.  The patient reports taking meds as prescribed, monitoring blood sugars, and dietary noncompliance.  When questioned about possible medication side effects, the patient notes none.    All other systems reviewed and were negative   Allergies: 1)  Penicillin G Potassium (Penicillin G Potassium)  Past History:  Past Medical History: Last updated: 02/05/2008 Diabetes mellitus, type II Hypertension Elevated cholesterol Renal insufficiency  Past Surgical History: Last updated: 01/02/2007 Shoulder sx Colonoscopy-09/01/2006  Family History: Last updated: 01/02/2007 Family History Diabetes 1st degree relative  Social History: Last updated: 01/02/2007 Retired Never Smoked  Risk Factors: Smoking Status: never (11/13/2009)  Physical Exam  General:  Well-developed,well-nourished,in no acute distress; alert,appropriate and cooperative throughout examination;  160/84 Head:  normocephalic and atraumatic.   Eyes:  pupils equal and pupils round.   Ears:  R ear normal and L ear normal.   Neck:  No deformities, masses, or tenderness noted. Chest Wall:  No deformities, masses, tenderness or gynecomastia noted. Lungs:  normal respiratory effort and no intercostal retractions.   Heart:  normal rate and  regular rhythm.   Abdomen:  soft and non-tender.   Msk:  No deformity or scoliosis noted of thoracic or lumbar spine.   Neurologic:  cranial nerves II-XII intact and gait normal.     Impression & Recommendations:  Problem # 1:  FREQUENCY, URINARY (ICD-788.41) check labs may need further evaluation Orders: UA Dipstick w/o Micro (automated)  (81003) T-Culture, Urine [id number])  Problem # 2:  RENAL INSUFFICIENCY (ICD-588.9) reviewed labs  Problem # 3:  HYPERTENSION (ICD-401.9) not controlled see meds advised to monitor at home His updated medication list for this problem includes:    Labetalol Hcl 300 Mg Tabs (Labetalol hcl) .Marland Kitchen... Take 1 tab by mouth twice a day    Lotrel 10-40 Mg Caps (Amlodipine besy-benazepril hcl) .Marland Kitchen... Take 1 capsule by mouth once a day  BP today: 170/100 Prior BP: 188/102 (11/13/2009)  Labs Reviewed: K+: 3.4 (11/06/2009) Creat: : 1.9 (11/06/2009)   Chol: 243 (11/06/2009)   HDL: 37.10 (11/06/2009)   LDL: DEL (05/26/2008)   TG: 274.0 (11/06/2009)  Problem # 4:  HYPERLIPIDEMIA (ICD-272.4) not controlled has had trouble tolerating meds at full dose His updated medication list for this problem includes:    Zetia 10 Mg Tabs (Ezetimibe) .Marland Kitchen... Take 1 tablet by mouth once a day  Lipitor 20 Mg Tabs (Atorvastatin calcium) .Marland Kitchen... Take 1/2 tab by mouth every other day  Labs Reviewed: SGOT: 19 (11/06/2009)   SGPT: 14 (11/06/2009)   HDL:37.10 (11/06/2009), 38.40 (07/10/2009)  LDL:DEL (05/26/2008), DEL (01/29/2008)  Chol:243 (11/06/2009), 263 (07/10/2009)  Trig:274.0 (11/06/2009), 252.0 (07/10/2009)  Complete Medication List: 1)  Labetalol Hcl 300 Mg Tabs (Labetalol hcl) .... Take 1 tab by mouth twice a day 2)  Zetia 10 Mg Tabs (Ezetimibe) .... Take 1 tablet by mouth once a day 3)  Co Q-10 300 Mg Caps (Coenzyme q10) .Marland Kitchen.. 1 by mouth two times a day 4)  Lipitor 20 Mg Tabs (Atorvastatin calcium) .... Take 1/2 tab by mouth every other day 5)  Glipizide 5 Mg Tb24  (Glipizide) .... Take 1 tablet by mouth two times a day with food 6)  Lotrel 10-40 Mg Caps (Amlodipine besy-benazepril hcl) .... Take 1 capsule by mouth once a day 7)  Cipro 500 Mg Tabs (Ciprofloxacin hcl) .Marland Kitchen.. 1 by mouth 2 times daily 8)  Hydrocodone-acetaminophen 5-325 Mg Tabs (Hydrocodone-acetaminophen) .Marland Kitchen.. 1 by mouth up to 4 times per day as needed for pain Prescriptions: HYDROCODONE-ACETAMINOPHEN 5-325 MG TABS (HYDROCODONE-ACETAMINOPHEN) 1 by mouth up to 4 times per day as needed for pain  #20 x 0   Entered and Authorized by:   Phoebe Sharps MD   Signed by:   Phoebe Sharps MD on 11/20/2009   Method used:   Print then Give to Patient   RxID:   [id number] CIPRO 500 MG TABS (CIPROFLOXACIN HCL) 1 by mouth 2 times daily  #20 x 0   Entered and Authorized by:   Phoebe Sharps MD   Signed by:   Phoebe Sharps MD on 11/20/2009   Method used:   Electronically to        [facility] [address] [facility] [phone]* (retail)       [address]       [place] [employer] [place],   [address]       Ph: [phone]       Fax: [phone]   RxID:   [id number]   Laboratory Results   Urine Tests    Routine Urinalysis   Color: yellow Appearance: Clear Glucose: negative   (Normal Range: Negative) Bilirubin: negative   (Normal Range: Negative) Ketone: negative   (Normal Range: Negative) Spec. Gravity: 1.010   (Normal Range: 1.003-1.035) Blood: negative   (Normal Range: Negative) pH: 5.5   (Normal Range: 5.0-8.0) Protein: negative   (Normal Range: Negative) Urobilinogen: 0.2   (Normal Range: 0-1) Nitrite: negative   (Normal Range: Negative) Leukocyte Esterace: 1+   (Normal Range: Negative)    Comments: Joyce Gross  Nov 20, 2009 11:16 AM

## 2010-10-14 ENCOUNTER — Encounter: Payer: Self-pay | Admitting: Internal Medicine

## 2010-11-08 ENCOUNTER — Other Ambulatory Visit: Payer: 59

## 2010-11-15 ENCOUNTER — Ambulatory Visit: Payer: 59 | Admitting: Internal Medicine

## 2010-11-16 NOTE — Op Note (Signed)
NAME:  [name] [name], [name] [name]              ACCOUNT NO.:  [id number]   MEDICAL RECORD NO.:  [id number]          PATIENT TYPE:  AMB   LOCATION:  [facility]                         FACILITY:  [facility]   PHYSICIAN:  Mark C. Karsten Ro, M.D.  DATE OF BIRTH:  [birth year]   DATE OF PROCEDURE:  05/21/2007  DATE OF DISCHARGE:                               OPERATIVE REPORT   PREOPERATIVE DIAGNOSIS:  Gross hematuria, bilateral hydronephrosis and  bladder lesions.   POSTOPERATIVE DIAGNOSIS:  Gross hematuria, bilateral hydronephrosis and  bladder lesions.   PROCEDURE:  Cystoscopy, bilateral retrograde pyelograms with  interpretation, bladder biopsy and barbotage urine for cytology.   SURGEON:  Mark C. Karsten Ro, M.D.   ANESTHESIA:  General.   BLOOD LOSS:  Minimal.   DRAINS:  None.   SPECIMENS:  Barbotage bladder specimen for cytology and bladder  biopsies.   COMPLICATIONS:  None.   INDICATIONS:  The patient is a 65 year old black male who had an episode  of gross hematuria.  His culture was negative and his NMP22  was  positive.  Upper tract evaluation with CT scan was negative using  hematuria protocol; however, cystoscopy revealed multiple reddened areas  of the bladder.  They appeared to be inflammatory but carcinoma in situ  could not be ruled out.  He also has chronic bilateral hydronephrosis.  I am going to evaluate his upper tracts as well.  We went over the  procedures, risks, complications and alternatives.  The patient  understands and  elect to proceed.   DESCRIPTION OF OPERATION:  After informed consent, the patient brought  to the major OR, placed on table, administered general anesthesia then  moved the dorsal lithotomy position.  Genitalia was sterilely prepped  and draped and official time-out was performed.  A 22-French rigid  cystoscope with 12 degrees lens was introduced per urethra which was  noted to be normal down to the sphincter which appears intact.  The  prostatic urethra  revealed elongation and there were no lesions.  The  bladder neck was widely patent.  Upon entering the bladder I note 4+  trabeculation with multiple shallow cellules.  Multiple reddened patches  were noted on the hypertrophied muscle bundles/trabeculation but it  appeared that the reddened patches were less in number than when I  performed cystoscopy on him recently.  The bladder was fully inspected  and no other no other papillary tumors, stones or other abnormality was  noted.   The right orifice was identified and 8-French cone-tip catheter was  inserted into the right ureteral orifice and right retrograde pyelogram  was performed in standard fashion using full strength iodinated contrast  injected under direct fluoroscopy.  The study revealed some mild J  hooking of the ureters and dilation of the ureter all the way up to the  renal pelvis was some redundancy of the ureter and a dilated renal  pelvis and calyceal system consistent with longstanding hydronephrosis.  However, after I injected the contrast, I watched the contrast under  fluoroscopy and it flowed down the ureter unimpeded on out the ureteral  orifice, indicating that this chronic  condition does not appear to be  obstructive in nature.  No filling defects or other abnormality were  noted either.  I then performed left retrograde pyelogram in identical  fashion with identical findings being noted and again no worrisome  filling defects or other lesions found.   The cold cup biopsy forceps were then used to obtain a biopsy of one of  the larger reddened patches on the right wall bladder and also a  representative reddened patch on the posterior wall of the bladder.  These were sent to pathology and then the areas were fulgurated with the  Bugbee electrode.  No further bleeding was noted.   Sterile saline was then used to fill the bladder and a Toomey syringe  was used to perform barbotage of the bladder.  This was sent  for  cytology.  I then reinserted the cystoscope lens and reinspected the  bladder.  No bleeding was noted or other abnormality.  I therefore  drained the bladder removed the cystoscope.  The patient was awakened  and taken to recovery in stable satisfactory condition.  He tolerated  procedure well with no intraoperative complications.  He was given a  prescription for 30 200 mg Pyridium and will take Flomax b.i.d. for 10  days, contact me if he has difficulty.  Otherwise I will see him back in  1 week for follow-up and discuss the pathology with him.      Mark C. Karsten Ro, M.D.  Electronically Signed     MCO/MEDQ  D:  05/21/2007  T:  05/22/2007  Job:  [id number]

## 2010-11-17 ENCOUNTER — Other Ambulatory Visit: Payer: 59

## 2010-11-19 NOTE — Consult Note (Signed)
[facility]                          ENDOCRINOLOGY CONSULTATION   [name] [name], [name] [name]                     MRN:          [id number]  DATE:08/21/2006                            DOB:          1946/05/20    REFERRING PHYSICIAN:  Darrick Penna. Swords, MD   REASON FOR REFERRAL:  Diabetes.   HISTORY OF PRESENT ILLNESS:  The patient is a 65 year old man with a  three-year history of diabetes.  He is unaware of any chronic  complications.  He currently takes Glyburide/Metformin 5/500 two tablets  twice a day.  He states his glucoses run from the 100s to 200s,  generally higher later in the day.  He describes his diet as fair and  his exercise regimen as good.  Symptomatically, he has several months  of slight polyuria, but no associated numbness of the feet.   PAST MEDICAL HISTORY:  1. Hypertension.  2. Dyslipidemia.   SOCIAL HISTORY:  He is retired.  He is here with his wife.   FAMILY HISTORY:  Positive for diabetes in both parents.   REVIEW OF SYSTEMS:  Denies any change in the weight.  Denies  hypoglycemia.   PHYSICAL EXAMINATION:  Blood pressure 144/86, heart rate 68, temperature  is 98.7, the weight is 244, the height is 6 feet 3 inches.  GENERAL:  No distress.  SKIN:  Normal texture and temperature, no rash.  HEENT:  No proptosis, no periorbital swelling.  Pharynx:  No erythema.  NECK:  No goiter.  CHEST:  Clear to auscultation, no respiratory distress.  CARDIOVASCULAR:  No JVD, no edema, regular rate and rhythm, no murmur.  Pedal pulses are intact and there is no bruit at the carotid arteries.  FEET:  Normal color and temperature.  No ulcer is present on the feet.  NEUROLOGIC:  Alert, oriented, does not appear anxious nor depressed.  Sensation is intact to touch on the feet.   LABORATORY STUDIES:  On June 13, 2006, hemoglobin A1c 7.7.  LDL  cholesterol 151.   IMPRESSION:  1. Type 2 diabetes with suboptimal control.  2. Hypertension  with suboptimal control.  3. Dyslipidemia with suboptimal control.   PLAN:  1. I discussed with the patient the risk to his health of all three      impressions noted above.  He is exceedingly hesitant to take      medication, believing that the medication is worse than the      illness.  I have tried repeatedly to reassure him that this is not      the case.  2. I advised him to redouble his diet and exercise efforts.  3. Increase in medication is refused.  4. Return three months.  5. This patient should be checked for heart disease, if he has not      been recently.     Sean A. Loanne Drilling, MD  Electronically Signed    SAE/MedQ  DD: 08/22/2006  DT: 08/23/2006  Job #: [id number]   cc:   Darrick Penna. Swords, MD

## 2010-11-23 ENCOUNTER — Other Ambulatory Visit: Payer: Self-pay | Admitting: Internal Medicine

## 2010-11-30 ENCOUNTER — Ambulatory Visit: Payer: 59 | Admitting: Internal Medicine

## 2010-12-15 ENCOUNTER — Other Ambulatory Visit: Payer: 59

## 2010-12-21 ENCOUNTER — Ambulatory Visit (INDEPENDENT_AMBULATORY_CARE_PROVIDER_SITE_OTHER): Payer: 59 | Admitting: Internal Medicine

## 2010-12-21 ENCOUNTER — Telehealth: Payer: Self-pay | Admitting: Internal Medicine

## 2010-12-21 ENCOUNTER — Encounter: Payer: Self-pay | Admitting: Internal Medicine

## 2010-12-21 VITALS — BP 152/98 | HR 101 | Wt 240.0 lb

## 2010-12-21 DIAGNOSIS — R21 Rash and other nonspecific skin eruption: Secondary | ICD-10-CM | POA: Insufficient documentation

## 2010-12-21 NOTE — Progress Notes (Signed)
  Subjective:    Patient ID: [name] [name], male    DOB: [birth year], 65 y.o.   MRN: [id number]  HPI Pt presents to clinic for evaluation of possible allergic reaction. Notes recent urologic problems required cystoscopy x 2 with most recent procedure performed last week at hospital. Was given septra ds bid x 4 days post procedure with no known sulfa allerga. Five days ago after beginning the medication noted beginning of rash involving arms which has progressively spread to diffuse body involvement. Attempted benadryl without significant improvement. Approximately 2-3 days ago noted oral involvement with sores developing, lip and tongue swelling without ocular involvement. Oral sx's have worsened over the last 36 hours and patient finds it difficult to eat without pain. Denies dyspnea or wheezing. Has also noted incidental subjective LG fever without obvious s/s of infection. No other alleviating or exacerbating factors. No other complaints.  Reviewed pmh, medications and allergies.    Review of Systems  Constitutional: Positive for fever. Negative for chills.  HENT: Positive for facial swelling and mouth sores. Negative for neck stiffness.   Eyes: Negative for pain, discharge, redness and itching.  Respiratory: Negative for cough, choking, shortness of breath and wheezing.   Skin: Positive for color change and rash. Negative for pallor.       Objective:   Physical Exam  Nursing note and vitals reviewed. Constitutional: He appears well-developed and well-nourished. No distress.  HENT:  Head: Normocephalic and atraumatic.  Right Ear: External ear normal.  Left Ear: External ear normal.  Nose: Nose normal.       Mild lower lip swelling noted with minimal desquamation. Retraction of lower lip demonstrates approximately 5 shallow white based ulcers. +possible left bucal ulcer. Tongue mildly edematous without airway obstruction. +white/tan appearance of tongue.  Eyes: Conjunctivae are normal.  No scleral icterus.  Neck: Neck supple.  Neurological: He is alert.  Skin: Skin is warm and dry. Rash noted. He is not diaphoretic. There is erythema. No pallor.       Diffuse erythematous slightly raised edematous circular rash.  Psychiatric: He has a normal mood and affect.          Assessment & Plan:

## 2010-12-21 NOTE — Telephone Encounter (Signed)
Pt is having a bad reactions from med due to a surgery .Marland Kitchen...reaction getting worse after trying other med It was suggested to pt to see Dr  For a c-pac- other contact # [phone]

## 2010-12-21 NOTE — Assessment & Plan Note (Signed)
Given oral involvement and recent septra exposure concern exists for possible [employer] syndrome. Dermatology has graciously agreed to see patient in consult today.

## 2010-12-21 NOTE — Telephone Encounter (Signed)
LMTCB

## 2010-12-22 ENCOUNTER — Ambulatory Visit: Payer: 59 | Admitting: Internal Medicine

## 2010-12-22 NOTE — Telephone Encounter (Signed)
Pt was seen by Dr Elizebeth Koller and a dermatologist

## 2010-12-24 ENCOUNTER — Other Ambulatory Visit: Payer: Self-pay | Admitting: Internal Medicine

## 2011-01-17 ENCOUNTER — Telehealth: Payer: Self-pay | Admitting: Internal Medicine

## 2011-01-17 NOTE — Telephone Encounter (Signed)
Patient needs refill on diabetic test strips

## 2011-01-18 NOTE — Telephone Encounter (Signed)
No answer, no machine.  Unsure of what kind of strips pt needs

## 2011-02-25 ENCOUNTER — Other Ambulatory Visit: Payer: Self-pay | Admitting: Internal Medicine

## 2011-03-24 ENCOUNTER — Other Ambulatory Visit: Payer: Self-pay | Admitting: Internal Medicine

## 2011-04-12 LAB — I-STAT 8, (EC8 V) (CONVERTED LAB)
Acid-Base Excess: 4 — ABNORMAL HIGH
BUN: 21
Bicarbonate: 30.4 — ABNORMAL HIGH
Chloride: 103
Glucose, Bld: 202 — ABNORMAL HIGH
HCT: 40
Hemoglobin: 13.6
Operator id: 268271
Potassium: 3.3 — ABNORMAL LOW
Sodium: 141
TCO2: 32
pCO2, Ven: 50.1 — ABNORMAL HIGH
pH, Ven: 7.391 — ABNORMAL HIGH

## 2011-04-20 ENCOUNTER — Other Ambulatory Visit: Payer: Self-pay | Admitting: Internal Medicine

## 2011-05-31 ENCOUNTER — Other Ambulatory Visit: Payer: Self-pay | Admitting: Internal Medicine

## 2011-06-20 ENCOUNTER — Encounter: Payer: Self-pay | Admitting: Internal Medicine

## 2011-06-20 ENCOUNTER — Ambulatory Visit (INDEPENDENT_AMBULATORY_CARE_PROVIDER_SITE_OTHER): Payer: 59 | Admitting: Internal Medicine

## 2011-06-20 DIAGNOSIS — I10 Essential (primary) hypertension: Secondary | ICD-10-CM

## 2011-06-20 DIAGNOSIS — L03116 Cellulitis of left lower limb: Secondary | ICD-10-CM

## 2011-06-20 DIAGNOSIS — L03119 Cellulitis of unspecified part of limb: Secondary | ICD-10-CM

## 2011-06-20 DIAGNOSIS — E119 Type 2 diabetes mellitus without complications: Secondary | ICD-10-CM

## 2011-06-20 DIAGNOSIS — E785 Hyperlipidemia, unspecified: Secondary | ICD-10-CM

## 2011-06-20 MED ORDER — LEVOFLOXACIN 500 MG PO TABS: 500.0000 mg | ORAL_TABLET | Freq: Every day | ORAL | Status: DC

## 2011-06-20 MED ORDER — GLUCOSE BLOOD VI STRP: ORAL_STRIP | Status: DC

## 2011-06-20 MED ORDER — FREESTYLE LANCETS MISC: Status: AC

## 2011-06-20 NOTE — Patient Instructions (Signed)
Take your antibiotic as prescribed until ALL of it is gone, but stop if you develop a rash, swelling, or any side effects of the medication.  Contact our office as soon as possible if  there are side effects of the medication.  Call or return to clinic prn if these symptoms worsen or fail to improve as anticipated.  

## 2011-06-20 NOTE — Progress Notes (Signed)
  Subjective:    Patient ID: [name] [name], male    DOB: [birth year], 65 y.o.   MRN: [id number]  HPI  65 year old patient who presents with a three-day history of pain and swelling along the dorsal aspect of the left foot. There has been no trauma. Denies any fever or chills pain initially started in the mid dorsal aspect of left foot and now it has spread laterally to involve the lateral aspect of the foot. He does have type 2 diabetes hypertension and dyslipidemia.    Review of Systems  Constitutional: Negative for fever, chills, appetite change and fatigue.  HENT: Negative for hearing loss, ear pain, congestion, sore throat, trouble swallowing, neck stiffness, dental problem, voice change and tinnitus.   Eyes: Negative for pain, discharge and visual disturbance.  Respiratory: Negative for cough, chest tightness, wheezing and stridor.   Cardiovascular: Negative for chest pain, palpitations and leg swelling.  Gastrointestinal: Negative for nausea, vomiting, abdominal pain, diarrhea, constipation, blood in stool and abdominal distention.  Genitourinary: Negative for urgency, hematuria, flank pain, discharge, difficulty urinating and genital sores.  Musculoskeletal: Negative for myalgias, back pain, joint swelling, arthralgias and gait problem.  Skin: Positive for wound. Negative for rash.  Neurological: Negative for dizziness, syncope, speech difficulty, weakness, numbness and headaches.  Hematological: Negative for adenopathy. Does not bruise/bleed easily.  Psychiatric/Behavioral: Negative for behavioral problems and dysphoric mood. The patient is not nervous/anxious.        Objective:   Physical Exam  Constitutional: He appears well-developed and well-nourished. No distress.       Temperature 99 degrees  Skin:       Soft tissue swelling tenderness and excessive warmth involving the dorsal aspect of the left foot          Assessment & Plan:   Cellulitis left foot. The patient  has a sulfa and penicillin allergy we'll treat with 7 days of Levaquin. Have asked  the patient to avoid excessive activity and to keep elevated as much as possible until improved

## 2011-06-22 ENCOUNTER — Telehealth: Payer: Self-pay | Admitting: Internal Medicine

## 2011-06-22 NOTE — Telephone Encounter (Signed)
Open in error

## 2011-06-30 ENCOUNTER — Telehealth: Payer: Self-pay

## 2011-06-30 MED ORDER — LEVOFLOXACIN 500 MG PO TABS: 500.0000 mg | ORAL_TABLET | Freq: Every day | ORAL | Status: AC

## 2011-06-30 NOTE — Telephone Encounter (Signed)
Pt's wife walked in to office today stating that pt was seen on 06/20/11 and diagnosed with a tissue tear in left foot. Pt has completed his antibiotics and his left foot is swelling again and sore.   Per Dr. Raliegh Ip call in generic levoquin 500 mg qd x 10 days. Pt's wife is aware.

## 2011-07-21 ENCOUNTER — Encounter: Payer: Self-pay | Admitting: Internal Medicine

## 2011-07-21 ENCOUNTER — Ambulatory Visit (INDEPENDENT_AMBULATORY_CARE_PROVIDER_SITE_OTHER): Payer: 59 | Admitting: Internal Medicine

## 2011-07-21 ENCOUNTER — Ambulatory Visit (INDEPENDENT_AMBULATORY_CARE_PROVIDER_SITE_OTHER)
Admission: RE | Admit: 2011-07-21 | Discharge: 2011-07-21 | Disposition: A | Discharge status: Home / Self Care | Payer: 59 | Source: Ambulatory Visit | Attending: Internal Medicine | Admitting: Internal Medicine

## 2011-07-21 DIAGNOSIS — M79672 Pain in left foot: Secondary | ICD-10-CM

## 2011-07-21 DIAGNOSIS — M109 Gout, unspecified: Secondary | ICD-10-CM | POA: Insufficient documentation

## 2011-07-21 DIAGNOSIS — I10 Essential (primary) hypertension: Secondary | ICD-10-CM

## 2011-07-21 DIAGNOSIS — E119 Type 2 diabetes mellitus without complications: Secondary | ICD-10-CM

## 2011-07-21 DIAGNOSIS — M79609 Pain in unspecified limb: Secondary | ICD-10-CM

## 2011-07-21 LAB — LDL CHOLESTEROL, DIRECT: Direct LDL: 125.8 mg/dL

## 2011-07-21 LAB — LIPID PANEL
HDL: 37.3 mg/dL — ABNORMAL LOW (ref >39.00)
Triglycerides: 301 mg/dL — ABNORMAL HIGH (ref 0.0–149.0)
VLDL: 60.2 mg/dL — ABNORMAL HIGH (ref 0.0–40.0)

## 2011-07-21 LAB — BASIC METABOLIC PANEL
BUN: 44 mg/dL — ABNORMAL HIGH (ref 6–23)
Calcium: 9.4 mg/dL (ref 8.4–10.5)
GFR: 39.7 mL/min — ABNORMAL LOW (ref >60.00)
Potassium: 3.3 mEq/L — ABNORMAL LOW (ref 3.5–5.1)

## 2011-07-21 LAB — HEMOGLOBIN A1C: Hgb A1c MFr Bld: 10.3 % — ABNORMAL HIGH (ref 4.6–6.5)

## 2011-07-21 LAB — URIC ACID: Uric Acid, Serum: 9 mg/dL — ABNORMAL HIGH (ref 4.0–7.8)

## 2011-07-21 LAB — HEPATIC FUNCTION PANEL
Albumin: 4 g/dL (ref 3.5–5.2)
Alkaline Phosphatase: 57 U/L (ref 39–117)

## 2011-07-21 NOTE — Progress Notes (Signed)
Patient ID: [name] [name], male   DOB: 05/06/1946, 66 y.o.   MRN: [id number]  patient comes in for followup of multiple medical problems including type 2 diabetes, hyperlipidemia, hypertension. The patient does not check blood sugar or blood pressure at home. The patetient does not follow an exercise or diet program. The patient denies any polyuria, polydipsia.  In the past the patient has gone to diabetic treatment center. The patient is tolerating medications  Without difficulty. The patient does admit to medication compliance.   Pt has been followed recently by [facility] for DM, htn, lipids  Acute visit today---left foot pain and swelling. Right before Christmas had acute pain and swelling. Sxs are now 60 % better. See dr. Marthann Schiller note and phone note.  Past Medical History  Diagnosis Date  . DIABETES MELLITUS, TYPE II 01/02/2007  . HYPERLIPIDEMIA 06/18/2007  . HYPERTENSION 01/02/2007  . RENAL INSUFFICIENCY 02/05/2008    History   Social History  . Marital Status: Married    Spouse Name: N/A    Number of Children: N/A  . Years of Education: N/A   Occupational History  . Not on file.   Social History Main Topics  . Smoking status: Never Smoker   . Smokeless tobacco: Not on file  . Alcohol Use: No  . Drug Use: No  . Sexually Active:    Other Topics Concern  . Not on file   Social History Narrative  . No narrative on file    Past Surgical History  Procedure Date  . Shoulder surgery     left    Family History  Problem Relation Age of Onset  . Hypertension Mother   . Diabetes Mother   . Stroke Father     Allergies  Allergen Reactions  . Bactrim (Sulfamethoxazole-Trimethoprim)   . Penicillins     REACTION: urticaria (hives)  . Sulfa Drugs Cross Reactors Hives, Itching and Swelling    Current Outpatient Prescriptions on File Prior to Visit  Medication Sig Dispense Refill  . atenolol (TENORMIN) 50 MG tablet Take 50 mg by mouth daily.        Marland Kitchen glucose blood (FREESTYLE  TEST STRIPS) test strip Use as instructed  100 each  12  . glyBURIDE (DIABETA) 2.5 MG tablet TAKE 1 TABLET BY MOUTH TWICE DAILY  180 tablet  2  . JANUVIA 100 MG tablet TAKE 1 TABLET BY MOUTH DAILY  30 tablet  2  . Lancets (FREESTYLE) lancets Use as instructed  100 each  12  . LOTREL 10-40 MG per capsule TAKE ONE CAPSULE BY MOUTH DAILY  90 capsule  0  . oxyCODONE-acetaminophen (PERCOCET) 10-325 MG per tablet Take 1 tablet by mouth every 4 (four) hours as needed.           patient denies chest pain, shortness of breath, orthopnea. Denies lower extremity edema, abdominal pain, change in appetite, change in bowel movements. Patient denies rashes, musculoskeletal complaints. No other specific complaints in a complete review of systems.   BP 126/82  Temp(Src) 98.8 F (37.1 C) (Oral)  Ht 6\' 3"  (1.905 m)  Wt 248 lb (112.492 kg)  BMI 31.00 kg/m2  well-developed well-nourished male in no acute distress. HEENT exam atraumatic, normocephalic, neck supple without jugular venous distention. Chest clear to auscultation cardiac exam S1-S2 are regular. Abdominal exam overweight with bowel sounds, soft and nontender. Extremities no edema, no swelling of foot, no erythema, no joint swelling.  Neurologic exam is alert with a normal gait.

## 2011-07-21 NOTE — Assessment & Plan Note (Signed)
06/20/11---treated for cellulitis 07/21/2011- wonder about gout as a possibility  Probably reasonable to check xray to rule out occult fracture (DM) Check uric acid level Avoid NSAIDs--renal insufficiency

## 2011-07-21 NOTE — Assessment & Plan Note (Signed)
Well controlled Continue meds Note on lasix.Marland Kitchen. ? Contribute to gout

## 2011-07-21 NOTE — Assessment & Plan Note (Signed)
Check labs today.

## 2011-07-27 ENCOUNTER — Other Ambulatory Visit: Payer: Self-pay | Admitting: *Deleted

## 2011-07-27 MED ORDER — POTASSIUM CHLORIDE CRYS ER 20 MEQ PO TBCR: 20.0000 meq | EXTENDED_RELEASE_TABLET | Freq: Every day | ORAL | Status: DC

## 2011-08-02 ENCOUNTER — Ambulatory Visit: Payer: 59 | Admitting: Internal Medicine

## 2011-08-04 ENCOUNTER — Ambulatory Visit (INDEPENDENT_AMBULATORY_CARE_PROVIDER_SITE_OTHER): Payer: 59 | Admitting: Internal Medicine

## 2011-08-04 ENCOUNTER — Encounter: Payer: Self-pay | Admitting: Internal Medicine

## 2011-08-04 DIAGNOSIS — M109 Gout, unspecified: Secondary | ICD-10-CM

## 2011-08-04 MED ORDER — COLCHICINE 0.6 MG PO TABS: ORAL_TABLET | ORAL | Status: DC

## 2011-08-04 MED ORDER — CLONIDINE HCL 0.2 MG PO TABS: 0.2000 mg | ORAL_TABLET | Freq: Two times a day (BID) | ORAL | Status: DC

## 2011-08-04 NOTE — Progress Notes (Signed)
Patient ID: [name] [name], male   DOB: 24-Nov-1945, 66 y.o.   MRN: [id number] Continues to have sxs of gout. Left great toe pain. Some erythema.  Stopped lasix  Note low potassium Note A1c  Past Medical History  Diagnosis Date  . DIABETES MELLITUS, TYPE II 01/02/2007  . HYPERLIPIDEMIA 06/18/2007  . HYPERTENSION 01/02/2007  . RENAL INSUFFICIENCY 02/05/2008    History   Social History  . Marital Status: Married    Spouse Name: N/A    Number of Children: N/A  . Years of Education: N/A   Occupational History  . Not on file.   Social History Main Topics  . Smoking status: Never Smoker   . Smokeless tobacco: Not on file  . Alcohol Use: No  . Drug Use: No  . Sexually Active:    Other Topics Concern  . Not on file   Social History Narrative  . No narrative on file    Past Surgical History  Procedure Date  . Shoulder surgery     left    Family History  Problem Relation Age of Onset  . Hypertension Mother   . Diabetes Mother   . Stroke Father     Allergies  Allergen Reactions  . Bactrim (Sulfamethoxazole-Trimethoprim)   . Penicillins     REACTION: urticaria (hives)  . Sulfa Drugs Cross Reactors Hives, Itching and Swelling    Current Outpatient Prescriptions on File Prior to Visit  Medication Sig Dispense Refill  . atenolol (TENORMIN) 50 MG tablet Take 50 mg by mouth daily.        Marland Kitchen glucose blood (FREESTYLE TEST STRIPS) test strip Use as instructed  100 each  12  . glyBURIDE (DIABETA) 2.5 MG tablet TAKE 1 TABLET BY MOUTH TWICE DAILY  180 tablet  2  . JANUVIA 100 MG tablet TAKE 1 TABLET BY MOUTH DAILY  30 tablet  2  . Lancets (FREESTYLE) lancets Use as instructed  100 each  12  . LOTREL 10-40 MG per capsule TAKE ONE CAPSULE BY MOUTH DAILY  90 capsule  0     patient denies chest pain, shortness of breath, orthopnea. Denies lower extremity edema, abdominal pain, change in appetite, change in bowel movements. Patient denies rashes, musculoskeletal complaints. No  other specific complaints in a complete review of systems.   BP 162/92  Pulse 76  Temp(Src) 98.1 F (36.7 C) (Oral)  Wt 246 lb (111.585 kg)  well-developed well-nourished male in no acute distress. HEENT exam atraumatic, normocephalic, neck supple without jugular venous distention. Chest clear to auscultation cardiac exam S1-S2 are regular. Abdominal exam overweight with bowel sounds, soft and nontender. Extremities no edema. Neurologic exam is alert with a normal gait.

## 2011-08-08 NOTE — Assessment & Plan Note (Signed)
Almost certainly gout Improved See med list Will try to limit diuretics as some could be contributing to gout

## 2011-08-26 ENCOUNTER — Other Ambulatory Visit: Payer: Self-pay | Admitting: Internal Medicine

## 2011-09-15 ENCOUNTER — Ambulatory Visit: Payer: 59 | Admitting: Internal Medicine

## 2011-09-19 ENCOUNTER — Encounter: Payer: Self-pay | Admitting: Internal Medicine

## 2011-09-19 ENCOUNTER — Ambulatory Visit (INDEPENDENT_AMBULATORY_CARE_PROVIDER_SITE_OTHER): Payer: 59 | Admitting: Internal Medicine

## 2011-09-19 DIAGNOSIS — I10 Essential (primary) hypertension: Secondary | ICD-10-CM

## 2011-09-19 DIAGNOSIS — E119 Type 2 diabetes mellitus without complications: Secondary | ICD-10-CM

## 2011-09-19 DIAGNOSIS — M109 Gout, unspecified: Secondary | ICD-10-CM

## 2011-09-19 MED ORDER — ALLOPURINOL 300 MG PO TABS: 300.0000 mg | ORAL_TABLET | Freq: Every day | ORAL | Status: DC

## 2011-09-19 NOTE — Assessment & Plan Note (Signed)
gloucometer given- Rx for strips

## 2011-09-19 NOTE — Patient Instructions (Signed)
Start allopurinol daily Start colchicine daily for 10 days and then stop (can still use for acute gout flare).  Follow up with your physicians in [place] for Diabetes and hypertension

## 2011-09-19 NOTE — Assessment & Plan Note (Signed)
He understands bp is too high . He will contact his nephrologist in [place]

## 2011-09-19 NOTE — Progress Notes (Signed)
Patient ID: [name] [name], male   DOB: Dec 09, 1945, 66 y.o.   MRN: [id number] Gout---see previous note. Reviewed uric acid level. He was doing well until 3 days ago when had recurrent pain left foot. He took cochicine with relief of sxs. Pain much better now (after taking colchicine).  DM, HTN and renal insuff followed at [facility].  Past Medical History  Diagnosis Date  . DIABETES MELLITUS, TYPE II 01/02/2007  . HYPERLIPIDEMIA 06/18/2007  . HYPERTENSION 01/02/2007  . RENAL INSUFFICIENCY 02/05/2008    History   Social History  . Marital Status: Married    Spouse Name: N/A    Number of Children: N/A  . Years of Education: N/A   Occupational History  . Not on file.   Social History Main Topics  . Smoking status: Never Smoker   . Smokeless tobacco: Not on file  . Alcohol Use: No  . Drug Use: No  . Sexually Active:    Other Topics Concern  . Not on file   Social History Narrative  . No narrative on file    Past Surgical History  Procedure Date  . Shoulder surgery     left    Family History  Problem Relation Age of Onset  . Hypertension Mother   . Diabetes Mother   . Stroke Father     Allergies  Allergen Reactions  . Bactrim (Sulfamethoxazole-Trimethoprim)   . Penicillins     REACTION: urticaria (hives)  . Sulfa Drugs Cross Reactors Hives, Itching and Swelling    Current Outpatient Prescriptions on File Prior to Visit  Medication Sig Dispense Refill  . atenolol (TENORMIN) 50 MG tablet Take 50 mg by mouth daily.        . cloNIDine (CATAPRES) 0.2 MG tablet Take 1 tablet (0.2 mg total) by mouth 2 (two) times daily.  60 tablet  11  . colchicine 0.6 MG tablet 1 tablet 3 times daily as needed for gout flare  20 tablet  1  . glucose blood (FREESTYLE TEST STRIPS) test strip Use as instructed  100 each  12  . glyBURIDE (DIABETA) 2.5 MG tablet TAKE 1 TABLET BY MOUTH TWICE DAILY  180 tablet  2  . JANUVIA 100 MG tablet TAKE 1 TABLET BY MOUTH DAILY  30 tablet  2  . Lancets  (FREESTYLE) lancets Use as instructed  100 each  12  . LOTREL 10-40 MG per capsule TAKE ONE CAPSULE BY MOUTH DAILY  90 capsule  0     patient denies chest pain, shortness of breath, orthopnea. Denies lower extremity edema, abdominal pain, change in appetite, change in bowel movements. Patient denies rashes, musculoskeletal complaints. No other specific complaints in a complete review of systems.   BP 160/90  Temp(Src) 98.6 F (37 C) (Oral)  Wt 246 lb (111.585 kg)  well-developed well-nourished male in no acute distress. HEENT exam atraumatic, normocephalic, neck supple without jugular venous distention. Chest clear to auscultation cardiac exam S1-S2 are regular. Abdominal exam overweight with bowel sounds, soft and nontender. Extremities no edema. Neurologic exam is alert with a normal gait.

## 2011-09-19 NOTE — Assessment & Plan Note (Addendum)
He has frequently recurrent gout. Start allopurinol-side effects discussed See pt instructions

## 2011-10-31 ENCOUNTER — Other Ambulatory Visit: Payer: Self-pay | Admitting: Internal Medicine

## 2011-11-28 ENCOUNTER — Other Ambulatory Visit: Payer: Self-pay | Admitting: Internal Medicine

## 2011-12-20 ENCOUNTER — Ambulatory Visit (INDEPENDENT_AMBULATORY_CARE_PROVIDER_SITE_OTHER): Payer: Medicare Other | Admitting: Internal Medicine

## 2011-12-20 ENCOUNTER — Encounter: Payer: Self-pay | Admitting: Internal Medicine

## 2011-12-20 VITALS — BP 136/82 | HR 80 | Temp 98.4°F | Ht 75.0 in | Wt 245.0 lb

## 2011-12-20 DIAGNOSIS — Z23 Encounter for immunization: Secondary | ICD-10-CM

## 2011-12-20 DIAGNOSIS — I10 Essential (primary) hypertension: Secondary | ICD-10-CM

## 2011-12-20 DIAGNOSIS — E119 Type 2 diabetes mellitus without complications: Secondary | ICD-10-CM

## 2011-12-20 LAB — BASIC METABOLIC PANEL
BUN: 47 mg/dL — ABNORMAL HIGH (ref 6–23)
Calcium: 9 mg/dL (ref 8.4–10.5)
Creatinine, Ser: 2.4 mg/dL — ABNORMAL HIGH (ref 0.4–1.5)
GFR: 35.62 mL/min — ABNORMAL LOW (ref >60.00)
Glucose, Bld: 458 mg/dL — ABNORMAL HIGH (ref 70–99)
Potassium: 4.1 mEq/L (ref 3.5–5.1)

## 2011-12-20 LAB — HEPATIC FUNCTION PANEL
Albumin: 3.9 g/dL (ref 3.5–5.2)
Total Bilirubin: 0.4 mg/dL (ref 0.3–1.2)

## 2011-12-20 LAB — LIPID PANEL
Cholesterol: 207 mg/dL — ABNORMAL HIGH (ref 0–200)
HDL: 39.6 mg/dL (ref >39.00)
Total CHOL/HDL Ratio: 5
Triglycerides: 311 mg/dL — ABNORMAL HIGH (ref 0.0–149.0)
VLDL: 62.2 mg/dL — ABNORMAL HIGH (ref 0.0–40.0)

## 2011-12-20 MED ORDER — CLONIDINE HCL 0.2 MG PO TABS: 0.2000 mg | ORAL_TABLET | Freq: Two times a day (BID) | ORAL | Status: DC

## 2011-12-20 MED ORDER — AMLODIPINE BESYLATE 10 MG PO TABS: 10.0000 mg | ORAL_TABLET | Freq: Every day | ORAL | Status: DC

## 2011-12-20 MED ORDER — BENAZEPRIL HCL 40 MG PO TABS: 40.0000 mg | ORAL_TABLET | Freq: Every day | ORAL | Status: DC

## 2011-12-20 NOTE — Assessment & Plan Note (Signed)
bp well controlled- Continue meds (change to amlodipine and benazepril from lotrel)

## 2011-12-20 NOTE — Assessment & Plan Note (Addendum)
Needs labs today He would like to stop januvia--$$ Unable to take metformin-- renal function.

## 2011-12-20 NOTE — Progress Notes (Signed)
Patient ID: [name] [name], male   DOB: June 19, 1946, 66 y.o.   MRN: [id number]  patient comes in for followup of multiple medical problems including type 2 diabetes, hyperlipidemia, hypertension. The patient does not check blood sugar or blood pressure at home. The patetient does not follow an exercise or diet program. The patient denies any polyuria, polydipsia.  In the past the patient has gone to diabetic treatment center. The patient is tolerating medications  Without difficulty. The patient does admit to medication compliance.   Past Medical History  Diagnosis Date  . DIABETES MELLITUS, TYPE II 01/02/2007  . HYPERLIPIDEMIA 06/18/2007  . HYPERTENSION 01/02/2007  . RENAL INSUFFICIENCY 02/05/2008    History   Social History  . Marital Status: Married    Spouse Name: N/A    Number of Children: N/A  . Years of Education: N/A   Occupational History  . Not on file.   Social History Main Topics  . Smoking status: Never Smoker   . Smokeless tobacco: Not on file  . Alcohol Use: No  . Drug Use: No  . Sexually Active:    Other Topics Concern  . Not on file   Social History Narrative  . No narrative on file    Past Surgical History  Procedure Date  . Shoulder surgery     left    Family History  Problem Relation Age of Onset  . Hypertension Mother   . Diabetes Mother   . Stroke Father     Allergies  Allergen Reactions  . Bactrim (Sulfamethoxazole-Tmp Ds)   . Penicillins     REACTION: urticaria (hives)  . Statins     myalgia  . Sulfa Drugs Cross Reactors Hives, Itching and Swelling    Current Outpatient Prescriptions on File Prior to Visit  Medication Sig Dispense Refill  . atenolol-chlorthalidone (TENORETIC) 100-25 MG per tablet TAKE 1 TABLET BY MOUTH DAILY  31 tablet  4  . cloNIDine (CATAPRES) 0.2 MG tablet Take 1 tablet (0.2 mg total) by mouth 2 (two) times daily.  60 tablet  11  . glucose blood (FREESTYLE TEST STRIPS) test strip Use as instructed  100 each  12  .  glyBURIDE (DIABETA) 2.5 MG tablet TAKE 1 TABLET BY MOUTH TWICE DAILY  180 tablet  2  . JANUVIA 100 MG tablet TAKE 1 TABLET BY MOUTH DAILY  30 tablet  2  . Lancets (FREESTYLE) lancets Use as instructed  100 each  12  . LOTREL 10-40 MG per capsule TAKE ONE CAPSULE BY MOUTH DAILY  90 capsule  0     patient denies chest pain, shortness of breath, orthopnea. Denies lower extremity edema, abdominal pain, change in appetite, change in bowel movements. Patient denies rashes, musculoskeletal complaints. No other specific complaints in a complete review of systems.   BP 136/82  Pulse 80  Temp 98.4 F (36.9 C) (Oral)  Ht 6\' 3"  (1.905 m)  Wt 245 lb (111.131 kg)  BMI 30.62 kg/m2  well-developed well-nourished male in no acute distress. HEENT exam atraumatic, normocephalic, neck supple without jugular venous distention. Chest clear to auscultation cardiac exam S1-S2 are regular. Abdominal exam overweight with bowel sounds, soft and nontender. Extremities no edema. Neurologic exam is alert with a normal gait.

## 2012-01-03 ENCOUNTER — Ambulatory Visit (INDEPENDENT_AMBULATORY_CARE_PROVIDER_SITE_OTHER): Payer: Medicare Other | Admitting: *Deleted

## 2012-01-03 DIAGNOSIS — E119 Type 2 diabetes mellitus without complications: Secondary | ICD-10-CM

## 2012-01-03 MED ORDER — INSULIN GLARGINE 100 UNIT/ML ~~LOC~~ SOLN: 20.0000 [IU] | Freq: Every day | SUBCUTANEOUS | Status: DC

## 2012-01-03 MED ORDER — "PEN NEEDLES 3/16"" 31G X 5 MM MISC": 1.0000 | Freq: Every day | Status: DC

## 2012-01-03 NOTE — Progress Notes (Signed)
Patient comes in today for insulin instructions per Dr Leanne Chang.  Pt was showed how to use insulin pen and given instruction pamphlet with sample needles.  Showed pt different injection sites.  Sent in Lantus and pen needles electronically to [facility].  Pt aware

## 2012-01-09 ENCOUNTER — Telehealth: Payer: Self-pay | Admitting: Internal Medicine

## 2012-01-09 NOTE — Telephone Encounter (Signed)
Caller: [name] [name]/Patient; PCP: Phoebe Sharps; CB#: [phone]; ; ; Call regarding Diabetes;  Onset- 01/04/12 Pt started insulin last week and blood sugars have continued to be elevated. This morning 1 1/2 hrs after eating 356. He had his regular scheduled insulin at 8:30am. Emergent s/s of Diabetes control problems r/o. Pt to see provider within 4hrs. After discussion with pt about meds, he stopped Glyburide last week when insulin started. Pt needs to know if he should come back in for appt and if he needs to restart Glyburide. It does not appear that Glyburide was discontinued in EPIC.

## 2012-01-10 ENCOUNTER — Other Ambulatory Visit: Payer: Self-pay | Admitting: *Deleted

## 2012-01-10 NOTE — Telephone Encounter (Signed)
Pt informed and understood

## 2012-01-10 NOTE — Telephone Encounter (Signed)
I gave pt insulin instructions last week

## 2012-01-10 NOTE — Telephone Encounter (Signed)
Stay on glyburide and increase lantus to 30 units daily

## 2012-03-07 ENCOUNTER — Other Ambulatory Visit: Payer: Self-pay | Admitting: Internal Medicine

## 2012-04-09 ENCOUNTER — Other Ambulatory Visit: Payer: Self-pay | Admitting: Internal Medicine

## 2012-04-26 ENCOUNTER — Other Ambulatory Visit: Payer: Self-pay | Admitting: Internal Medicine

## 2012-04-26 DIAGNOSIS — E119 Type 2 diabetes mellitus without complications: Secondary | ICD-10-CM

## 2012-04-26 MED ORDER — "PEN NEEDLES 3/16"" 31G X 5 MM MISC": 1.0000 | Freq: Every day | Status: DC

## 2012-04-26 NOTE — Telephone Encounter (Signed)
rx sent in electronically 

## 2012-04-26 NOTE — Telephone Encounter (Signed)
Patient needs new rx for insulin needles .pharmacy-[facility] @ [address]/[address].per medicare guidelines.

## 2012-08-03 ENCOUNTER — Telehealth: Payer: Self-pay | Admitting: Internal Medicine

## 2012-08-03 NOTE — Telephone Encounter (Signed)
Patient Information:  Caller Name: [name]  Phone: [phone]  Patient: [name] [name], [name] [name]  Gender: Male  DOB: [birth year]  Age: 67 Years  PCP: Phoebe Sharps (Adults only)  Office Follow Up:  Does the office need to follow up with this patient?: No  Instructions For The Office: N/A  RN Note:  Patient is able to stand however is very lightheaded and dizzy.  Patient has fallen once last night and once today. Spouse states that patient baseline is that he is very active and steady on his feet. States oriented to person, place, and time however is not his normal self and seems somewhat disoriented.  Instructed to take to Emergency Room for evaluation.   Symptoms  Reason For Call & Symptoms: Diarrhea, Weakness, Falls x2,  Reviewed Health History In EMR: Yes  Reviewed Medications In EMR: Yes  Reviewed Allergies In EMR: Yes  Reviewed Surgeries / Procedures: Yes  Date of Onset of Symptoms: 07/27/2012  Treatments Tried: Increased fuid intake  Treatments Tried Worked: No  Guideline(s) Used:  Diarrhea  Disposition Per Guideline:   Go to ED Now (or to Office with PCP Approval)  Reason For Disposition Reached:   Patient sounds very sick or weak to the triager  Advice Given:  N/A

## 2012-08-03 NOTE — Telephone Encounter (Signed)
Spoke with patient and he states that he's "feeling a little better" and will be seen tomorrow at urgent care or ER if needed.

## 2012-08-22 ENCOUNTER — Ambulatory Visit (INDEPENDENT_AMBULATORY_CARE_PROVIDER_SITE_OTHER): Payer: Medicare Other | Admitting: Internal Medicine

## 2012-08-22 ENCOUNTER — Encounter: Payer: Self-pay | Admitting: Internal Medicine

## 2012-08-22 VITALS — BP 160/100 | HR 64 | Temp 99.0°F | Resp 18 | Wt 242.0 lb

## 2012-08-22 DIAGNOSIS — N259 Disorder resulting from impaired renal tubular function, unspecified: Secondary | ICD-10-CM

## 2012-08-22 DIAGNOSIS — E119 Type 2 diabetes mellitus without complications: Secondary | ICD-10-CM

## 2012-08-22 DIAGNOSIS — I10 Essential (primary) hypertension: Secondary | ICD-10-CM

## 2012-08-22 LAB — BASIC METABOLIC PANEL
CO2: 26 mEq/L (ref 19–32)
Chloride: 100 mEq/L (ref 96–112)
Glucose, Bld: 249 mg/dL — ABNORMAL HIGH (ref 70–99)
Potassium: 3.5 mEq/L (ref 3.5–5.1)
Sodium: 135 mEq/L (ref 135–145)

## 2012-08-22 LAB — HEMOGLOBIN A1C: Hgb A1c MFr Bld: 11 % — ABNORMAL HIGH (ref 4.6–6.5)

## 2012-08-22 MED ORDER — AMLODIPINE BESY-BENAZEPRIL HCL 10-40 MG PO CAPS: 1.0000 | ORAL_CAPSULE | Freq: Every day | ORAL | Status: DC

## 2012-08-22 MED ORDER — INSULIN ASPART 100 UNIT/ML ~~LOC~~ SOLN: SUBCUTANEOUS | Status: DC

## 2012-08-22 MED ORDER — ATENOLOL 100 MG PO TABS: 100.0000 mg | ORAL_TABLET | Freq: Every day | ORAL | Status: DC

## 2012-08-22 NOTE — Patient Instructions (Signed)
Increase Lantus insulin to 26 units at bedtime daily. If blood sugars remain greater than 160 increase to 30 units  Followup one month  Limit your sodium (Salt) intake  Please check your blood pressure on a regular basis.  If it is consistently greater than 150/90, please make an office appointment.  And NovoLog insulin prior to your evening meal  Discontinue glyburide

## 2012-08-22 NOTE — Progress Notes (Signed)
Subjective:    Patient ID: [name] [name], male    DOB: 11-24-1945, 67 y.o.   MRN: [id number]  HPI  67 year old patient who is seen today for followup of diabetes.  He has been on Lantus insulin 20 units for approximately 8 months. Hemoglobin A1c was greater than 10 at that time.  A few months ago he developed a flu syndrome and also discontinued glyburide do to lack of insurance coverage. He states blood sugars fasting have been ranging from 220 to 260. Prior to this fasting blood sugars run in the 160 range. He has some residual fatigue but otherwise doing fairly well. He has multi-drug-resistant hypertension as well as chronic kidney disease.  Past Medical History  Diagnosis Date  . DIABETES MELLITUS, TYPE II 01/02/2007  . HYPERLIPIDEMIA 06/18/2007  . HYPERTENSION 01/02/2007  . RENAL INSUFFICIENCY 02/05/2008    History   Social History  . Marital Status: Married    Spouse Name: N/A    Number of Children: N/A  . Years of Education: N/A   Occupational History  . Not on file.   Social History Main Topics  . Smoking status: Never Smoker   . Smokeless tobacco: Not on file  . Alcohol Use: No  . Drug Use: No  . Sexually Active:    Other Topics Concern  . Not on file   Social History Narrative  . No narrative on file    Past Surgical History  Procedure Laterality Date  . Shoulder surgery      left    Family History  Problem Relation Age of Onset  . Hypertension Mother   . Diabetes Mother   . Stroke Father     Allergies  Allergen Reactions  . Bactrim (Sulfamethoxazole-Tmp Ds)   . Penicillins     REACTION: urticaria (hives)  . Statins     myalgia  . Sulfa Drugs Cross Reactors Hives, Itching and Swelling    Current Outpatient Prescriptions on File Prior to Visit  Medication Sig Dispense Refill  . amLODipine (NORVASC) 10 MG tablet Take 1 tablet (10 mg total) by mouth daily.  90 tablet  3  . atenolol-chlorthalidone (TENORETIC) 100-25 MG per tablet TAKE 1 TABLET BY  MOUTH DAILY  31 tablet  4  . benazepril (LOTENSIN) 40 MG tablet Take 1 tablet (40 mg total) by mouth daily.  90 tablet  3  . insulin glargine (LANTUS) 100 UNIT/ML injection Inject 30 Units into the skin daily.      . Insulin Pen Needle (PEN NEEDLES 3/16") 31G X 5 MM MISC 1 each by Does not apply route daily.  100 each  5  . glyBURIDE (DIABETA) 2.5 MG tablet TAKE 1 TABLET BY MOUTH TWICE DAILY  180 tablet  2   No current facility-administered medications on file prior to visit.    BP 160/100  Pulse 64  Temp(Src) 99 F (37.2 C) (Oral)  Resp 18  Wt 242 lb (109.77 kg)  BMI 30.25 kg/m2  SpO2 96%      Review of Systems  Constitutional: Positive for fatigue. Negative for fever, chills and appetite change.  HENT: Negative for hearing loss, ear pain, congestion, sore throat, trouble swallowing, neck stiffness, dental problem, voice change and tinnitus.   Eyes: Negative for pain, discharge and visual disturbance.  Respiratory: Negative for cough, chest tightness, wheezing and stridor.   Cardiovascular: Negative for chest pain, palpitations and leg swelling.  Gastrointestinal: Negative for nausea, vomiting, abdominal pain, diarrhea, constipation, blood in stool and  abdominal distention.  Genitourinary: Negative for urgency, hematuria, flank pain, discharge, difficulty urinating and genital sores.  Musculoskeletal: Negative for myalgias, back pain, joint swelling, arthralgias and gait problem.  Skin: Negative for rash.  Neurological: Negative for dizziness, syncope, speech difficulty, weakness, numbness and headaches.  Hematological: Negative for adenopathy. Does not bruise/bleed easily.  Psychiatric/Behavioral: Negative for behavioral problems and dysphoric mood. The patient is not nervous/anxious.        Objective:   Physical Exam  Constitutional: He is oriented to person, place, and time. He appears well-developed.  HENT:  Head: Normocephalic.  Right Ear: External ear normal.  Left  Ear: External ear normal.  Eyes: Conjunctivae and EOM are normal.  Neck: Normal range of motion.  Cardiovascular: Normal rate and normal heart sounds.   Pulmonary/Chest: Breath sounds normal.  Abdominal: Bowel sounds are normal.  Musculoskeletal: Normal range of motion. He exhibits no edema and no tenderness.  Neurological: He is alert and oriented to person, place, and time.  Psychiatric: He has a normal mood and affect. His behavior is normal.          Assessment & Plan:   Diabetes mellitus. Will increase Lantus insulin to 26 units and titrate further until acceptable fasting blood sugar. We'll also place on NovoLog prior to his evening meal with sliding scale coverage Hypertension repeat blood pressure 135/78. Medications refilled. Will recheck 1 month Chronic kidney disease. Will check updated lab

## 2012-08-24 ENCOUNTER — Other Ambulatory Visit: Payer: Self-pay | Admitting: Internal Medicine

## 2012-09-28 ENCOUNTER — Encounter: Payer: Self-pay | Admitting: Internal Medicine

## 2012-09-28 ENCOUNTER — Ambulatory Visit (INDEPENDENT_AMBULATORY_CARE_PROVIDER_SITE_OTHER): Payer: Medicare Other | Admitting: Internal Medicine

## 2012-09-28 VITALS — BP 170/100 | HR 68 | Temp 98.6°F | Wt 245.0 lb

## 2012-09-28 DIAGNOSIS — E785 Hyperlipidemia, unspecified: Secondary | ICD-10-CM

## 2012-09-28 DIAGNOSIS — M109 Gout, unspecified: Secondary | ICD-10-CM

## 2012-09-28 DIAGNOSIS — E119 Type 2 diabetes mellitus without complications: Secondary | ICD-10-CM

## 2012-09-28 DIAGNOSIS — I10 Essential (primary) hypertension: Secondary | ICD-10-CM

## 2012-09-28 DIAGNOSIS — N259 Disorder resulting from impaired renal tubular function, unspecified: Secondary | ICD-10-CM

## 2012-09-28 LAB — HM DIABETES FOOT EXAM

## 2012-09-28 MED ORDER — INSULIN ASPART 100 UNIT/ML ~~LOC~~ SOLN: SUBCUTANEOUS | Status: DC

## 2012-09-28 MED ORDER — INSULIN GLARGINE 100 UNIT/ML ~~LOC~~ SOLN: 30.0000 [IU] | Freq: Every day | SUBCUTANEOUS | Status: DC

## 2012-09-28 MED ORDER — HYDRALAZINE HCL 25 MG PO TABS: 25.0000 mg | ORAL_TABLET | Freq: Three times a day (TID) | ORAL | Status: DC

## 2012-09-28 NOTE — Assessment & Plan Note (Addendum)
Lab Results  Component Value Date   HGBA1C 11.0* 08/22/2012   Terrible control-- needs more aggressive lifestyle management, increase insulin and refer endocrinology

## 2012-09-28 NOTE — Progress Notes (Signed)
Patient ID: [name] [name], male   DOB: 05-15-1946, 67 y.o.   MRN: [id number]  patient comes in for followup of multiple medical problems including type 2 diabetes, hyperlipidemia, hypertension. The patient does check blood sugar  (150-250. Not checkin  blood pressure at home. The patetient does not follow an exercise or diet program. The patient denies any polyuria, polydipsia.  In the past the patient has gone to diabetic treatment center. The patient is tolerating medications  Without difficulty. The patient does admit to medication compliance.   He admits to drinking a lot of sweet tea  Past Medical History  Diagnosis Date  . DIABETES MELLITUS, TYPE II 01/02/2007  . HYPERLIPIDEMIA 06/18/2007  . HYPERTENSION 01/02/2007  . RENAL INSUFFICIENCY 02/05/2008    History   Social History  . Marital Status: Married    Spouse Name: N/A    Number of Children: N/A  . Years of Education: N/A   Occupational History  . Not on file.   Social History Main Topics  . Smoking status: Never Smoker   . Smokeless tobacco: Not on file  . Alcohol Use: No  . Drug Use: No  . Sexually Active:    Other Topics Concern  . Not on file   Social History Narrative  . No narrative on file    Past Surgical History  Procedure Laterality Date  . Shoulder surgery      left    Family History  Problem Relation Age of Onset  . Hypertension Mother   . Diabetes Mother   . Stroke Father     Allergies  Allergen Reactions  . Bactrim (Sulfamethoxazole-Tmp Ds)   . Penicillins     REACTION: urticaria (hives)  . Statins     myalgia  . Sulfa Drugs Cross Reactors Hives, Itching and Swelling    Current Outpatient Prescriptions on File Prior to Visit  Medication Sig Dispense Refill  . atenolol (TENORMIN) 100 MG tablet Take 1 tablet (100 mg total) by mouth daily.  90 tablet  3  . FREESTYLE TEST STRIPS test strip USE TO TEST BLOOD SUGARS ONCE DAILY  100 each  11  . insulin aspart (NOVOLOG) 100 UNIT/ML injection  6 units prior to your evening meal If blood sugar is greater than 160 add  an additional 4 units  3 vial  PRN  . insulin glargine (LANTUS) 100 UNIT/ML injection Inject 22 Units into the skin daily.       . Insulin Pen Needle (PEN NEEDLES 3/16") 31G X 5 MM MISC 1 each by Does not apply route daily.  100 each  5   No current facility-administered medications on file prior to visit.     patient denies chest pain, shortness of breath, orthopnea. Denies lower extremity edema, abdominal pain, change in appetite, change in bowel movements. Patient denies rashes, musculoskeletal complaints. No other specific complaints in a complete review of systems.   BP 180/96  Pulse 68  Temp(Src) 98.6 F (37 C) (Oral)  Wt 245 lb (111.131 kg)  BMI 30.62 kg/m2  well-developed well-nourished male in no acute distress. HEENT exam atraumatic, normocephalic, neck supple without jugular venous distention. Chest clear to auscultation cardiac exam S1-S2 are regular. Abdominal exam overweight with bowel sounds, soft and nontender. Extremities no edema. Neurologic exam is alert with a normal gait.

## 2012-09-28 NOTE — Assessment & Plan Note (Signed)
No recurrence. 

## 2012-09-28 NOTE — Assessment & Plan Note (Signed)
Lab Results  Component Value Date   CREATININE 2.2* 08/22/2012   Needs regular f/u (nephrology)

## 2012-09-28 NOTE — Assessment & Plan Note (Addendum)
Lab Results  Component Value Date   CHOL 207* 12/20/2011   HDL 39.60 12/20/2011   LDLDIRECT 103.6 12/20/2011   TRIG 311.0* 12/20/2011   CHOLHDL 5 12/20/2011  unable to tolerate statins

## 2012-09-28 NOTE — Assessment & Plan Note (Signed)
Not controlled See new medication list

## 2012-10-22 ENCOUNTER — Ambulatory Visit (INDEPENDENT_AMBULATORY_CARE_PROVIDER_SITE_OTHER): Payer: Medicare Other | Admitting: Internal Medicine

## 2012-10-22 ENCOUNTER — Encounter: Payer: Self-pay | Admitting: Internal Medicine

## 2012-10-22 VITALS — BP 158/98 | HR 66 | Temp 99.1°F | Resp 10 | Ht 74.5 in | Wt 255.0 lb

## 2012-10-22 DIAGNOSIS — E119 Type 2 diabetes mellitus without complications: Secondary | ICD-10-CM

## 2012-10-22 NOTE — Patient Instructions (Signed)
Please return in one month with her sugar log. Please check your sugars before meals and at bedtime at least twice a day, rotating check times.  PATIENT INSTRUCTIONS FOR TYPE 2 DIABETES:  **Please join MyChart!** - see attached instructions about how to join   DIET AND EXERCISE Diet and exercise is an important part of diabetic treatment.  We recommended aerobic exercise in the form of brisk walking (working between 40-60% of maximal aerobic capacity, similar to brisk walking) for 150 minutes per week (such as 30 minutes five days per week) along with 3 times per week performing 'resistance' training (using various gauge rubber tubes with handles) 5-10 exercises involving the major muscle groups (upper body, lower body and core) performing 10-15 repetitions (or near fatigue) each exercise. Start at half the above goal but build slowly to reach the above goals. If limited by weight, joint pain, or disability, we recommend daily walking in a swimming pool with water up to waist to reduce pressure from joints while allow for adequate exercise.    BLOOD GLUCOSES Monitoring your blood glucoses is important for continued management of your diabetes. Please check your blood glucoses 2-4 times a day: fasting, before meals and at bedtime (you can rotate these measurements - e.g. one day check before the 3 meals, the next day check before 2 of the meals and before bedtime, etc.   HYPOGLYCEMIA (low blood sugar) Hypoglycemia is usually a reaction to not eating, exercising, or taking too much insulin/ other diabetes drugs.  Symptoms include tremors, sweating, hunger, confusion, headache, etc. Treat IMMEDIATELY with 15 grams of Carbs:   4 glucose tablets    cup regular juice/soda   2 tablespoons raisins   4 teaspoons sugar   1 tablespoon honey Recheck blood glucose in 15 mins and repeat above if still symptomatic/blood glucose <100. Please contact our office at [phone] if you have questions about how  to next handle your insulin.  RECOMMENDATIONS TO REDUCE YOUR RISK OF DIABETIC COMPLICATIONS: * Take your prescribed MEDICATION(S). * Follow a DIABETIC diet: Complex carbs, fiber rich foods, heart healthy fish twice weekly, (monounsaturated and polyunsaturated) fats * AVOID saturated/trans fats, high fat foods, >2,300 mg salt per day. * EXERCISE at least 5 times a week for 30 minutes or preferably daily.  * DO NOT SMOKE OR DRINK more than 1 drink a day. * Check your FEET every day. Do not wear tightfitting shoes. Contact us if you develop an ulcer * See your EYE doctor once a year or more if needed * Get a FLU shot once a year * Get a PNEUMONIA vaccine once before and once after age 84 years  GOALS:  * Your Hemoglobin A1c of 123XX123  * Your Systolic BP should be [id number] or lower  * Your Diastolic BP should be 80 or lower  * Your HDL (Good Cholesterol) should be 40 or higher  * Your LDL (Bad Cholesterol) should be 100 or lower  * Your Triglycerides should be 150 or lower  * Your Urine microalbumin (kidney function) should be <30 * Your Body Mass Index should be 25 or lower   We will be glad to help you achieve these goals. Our telephone number is: [phone].

## 2012-10-22 NOTE — Progress Notes (Addendum)
Patient ID: [name] [name], male   DOB: 1946-03-19, 67 y.o.   MRN: [id number]  HPI: [name] [name] is a 67 y.o.-year-old male, referred by his PCP, Dr. Leanne Chang, for management of DM2, insulin-dependent, uncontrolled, with complications (CKD 3, ED).  Patient has been diagnosed with diabetes in ~2004; started insulin last year insulin before. Last hemoglobin A1c was: Lab Results  Component Value Date   HGBA1C 11.0* 08/22/2012    Pt is on a regimen of: - Lantus 20 units qhs - Novolog 6 units tid ac >> was not taking this before last HbA1C, it was added ~1 mo ago  Pt checks his sugars 1 a day and they are: - am: 90-120 No lows. Lowest sugar was 96; he has hypoglycemia awareness at 90s. Highest sugar was high 200s.  Pt's meals are: - Breakfast: oatmeal (oldfashioned), coffee (1 teaspoon sugar) - Lunch: sandwich + [place] ham/beef, soup, cookies - Dinner: meat + veggies (largest) - Snacks: peanuts/snack - unsalted, dark chocolate He drinks sweet tea made by his wife: 3 x 8 oz glasses a day. He had nutritional classes a year ago at [facility] - he went there accompanying a friend.  He exercises regularly, going 2-4 times a week at the gym, and working on the elliptical and lifting weights.  Pt does not have chronic kidney disease, last BUN/creatinine was:  Lab Results  Component Value Date   BUN 47* 08/22/2012   CREATININE 2.2* 08/22/2012   Last set of lipids: Lab Results  Component Value Date   CHOL 207* 12/20/2011   HDL 39.60 12/20/2011   LDLDIRECT 103.6 12/20/2011   TRIG 311.0* 12/20/2011   CHOLHDL 5 12/20/2011   Pt's last eye exam was in 2 years. No DR, reportedly. Denies numbness and tingling in his legs.  I reviewed hischart and he also has a history of hypertension, hyperlipidemia, gout, erectile dysfunction-status post TURP revision 2012, history of urinary retention, chronic prostatitis, history of cellulitis of the foot.  Pt has FH of DM in mother, father,  grandfather.  ROS: Constitutional: + weight gain since starting NovoLog, + fatigue, + hot flushes Eyes: + blurry vision - when sugars high, no xerophthalmia ENT: no sore throat, no nodules palpated in throat, no dysphagia/odynophagia, no hoarseness Cardiovascular: no CP/SOB/palpitations/leg swelling Respiratory: no cough/SOB Gastrointestinal: no N/V/D/C Musculoskeletal: + muscle aches/no joint aches Skin: no rashes Neurological: + tremors/numbness/tingling/dizziness Psychiatric: no depression/anxiety + difficulty with erections Past Medical History  Diagnosis Date  . DIABETES MELLITUS, TYPE II 01/02/2007  . HYPERLIPIDEMIA 06/18/2007  . HYPERTENSION 01/02/2007  . RENAL INSUFFICIENCY 02/05/2008   Past Surgical History  Procedure Laterality Date  . Shoulder surgery      left   History   Social History  . Marital Status: Married    Number of Children: 2   Occupational History  . Retired Airline pilot   Social History Main Topics  . Smoking status: Never Smoker   . Smokeless tobacco: No  . Alcohol Use: occasionally  . Drug Use: No  . Sexually Active:    Current Outpatient Prescriptions on File Prior to Visit  Medication Sig Dispense Refill  . amLODipine (NORVASC) 10 MG tablet Take 1 tablet by mouth daily.      Marland Kitchen atenolol (TENORMIN) 100 MG tablet Take 1 tablet (100 mg total) by mouth daily.  90 tablet  3  . benazepril (LOTENSIN) 40 MG tablet Take 1 tablet by mouth daily.      Marland Kitchen FREESTYLE TEST STRIPS test strip USE TO  TEST BLOOD SUGARS ONCE DAILY  100 each  11  . hydrALAZINE (APRESOLINE) 25 MG tablet Take 1 tablet (25 mg total) by mouth 3 (three) times daily.  300 tablet  3  . insulin aspart (NOVOLOG) 100 UNIT/ML injection Take 6 units with every meal unless blood sugar above 200 than take 10 units  3 vial  11  . insulin glargine (LANTUS) 100 UNIT/ML injection Inject 0.3 mLs (30 Units total) into the skin daily.  10 mL    . Insulin Pen Needle (PEN NEEDLES 3/16") 31G X 5 MM MISC 1  each by Does not apply route daily.  100 each  5   No current facility-administered medications on file prior to visit.   Allergies  Allergen Reactions  . Bactrim (Sulfamethoxazole-Tmp Ds)   . Penicillins     REACTION: urticaria (hives)  . Statins     myalgia  . Sulfa Drugs Cross Reactors Hives, Itching and Swelling   Family History  Problem Relation Age of Onset  . Hypertension Mother   . Diabetes Mother   . Stroke Father    PE: BP 158/98  Pulse 66  Temp(Src) 99.1 F (37.3 C) (Oral)  Resp 10  Ht 6' 2.5" (1.892 m)  Wt 255 lb (115.667 kg)  BMI 32.31 kg/m2  SpO2 99% Wt Readings from Last 3 Encounters:  10/22/12 255 lb (115.667 kg)  09/28/12 245 lb (111.131 kg)  08/22/12 242 lb (109.77 kg)   Constitutional: overweight, in NAD Eyes: PERRLA, EOMI, no exophthalmos ENT: moist mucous membranes, no thyromegaly, no cervical lymphadenopathy Cardiovascular: RRR, No MRG Respiratory: CTA B Gastrointestinal: abdomen soft, NT, ND, BS+ Musculoskeletal: no deformities, strength intact in all 4 Skin: moist, warm, no rashes - as the vitiligo macular spot on the right side of the neck, approximately 1.5 cm in diameter Neurological: + tremor with outstretched hands, L>R hand, DTR normal in all 4  ASSESSMENT: 1. DM2, insulin-dependent, uncontrolled, with complications: - CKD 3 - ED  PLAN:  1. Patient with long-standing diabetes, requiring lower doses of insulin, possibly type 1 diabetes (he has vitiligo and mentions that his grandmother also had it so it is possible that he has another autoimmune disorder in the form of DM1) - Patient's sugars are doing much better after starting short-acting insulin before meals, however he only checks his sugars in a.m., fasting, so it is unclear to me how his control is improved later in the day. His fasting sugars are at goal per his report, between 90 and 120. - his meals are not equal in size, with a smaller breakfast and a larger dinner, so he  probably requires different doses of NovoLog with his meals, however it is difficult to make a decision about changing the doses without having CBGs. I think he would benefit from a sliding scale, too, in the future. - for now will continue Lantus at 20 units at bedtime, and NovoLog 6 units 3 times a day a.c. - we had a long discussion about reducing the number of carbs and fat and trying to improve his diet, we also discussed about foods with high versus low glycemic index, and the importance of eating fruit and increasing fiber in the diet - given sugar log and advised how to fill it and to bring it at next appt - check at least 2 times a day, before meals and at bedtime  - given foot care handout and explained the principles - given instructions for hypoglycemia management "15-15 rule" -  given general instructions about type 2 diabetes (please see patient instructions section), although he might be type I - I offered to refer pt to diabetes education for further advice about diabetes and for nutrition advice, but he refused for now - I advised pt to see his ophthalmologist, and he should continue to have annual exams - I encouraged pt to join MyChart and to send me any questions or concerns, and also to send me his sugars - RTC in one month with sugar log

## 2012-11-02 ENCOUNTER — Encounter: Payer: Self-pay | Admitting: Internal Medicine

## 2012-11-02 ENCOUNTER — Ambulatory Visit (INDEPENDENT_AMBULATORY_CARE_PROVIDER_SITE_OTHER): Payer: Medicare Other | Admitting: Internal Medicine

## 2012-11-02 VITALS — BP 200/108 | HR 72 | Temp 97.9°F | Wt 253.0 lb

## 2012-11-02 DIAGNOSIS — I10 Essential (primary) hypertension: Secondary | ICD-10-CM

## 2012-11-02 MED ORDER — FUROSEMIDE 20 MG PO TABS: 20.0000 mg | ORAL_TABLET | Freq: Every day | ORAL | Status: DC

## 2012-11-02 MED ORDER — LABETALOL HCL 200 MG PO TABS: 200.0000 mg | ORAL_TABLET | Freq: Two times a day (BID) | ORAL | Status: DC

## 2012-11-02 MED ORDER — PRAVASTATIN SODIUM 40 MG PO TABS: 40.0000 mg | ORAL_TABLET | Freq: Every day | ORAL | Status: DC

## 2012-11-02 NOTE — Progress Notes (Signed)
Subjective:    Patient ID: [name] [name], male    DOB: [birth year], 67 y.o.   MRN: [id number]  HPI  67 year old African American male with history of poorly controlled type 2 diabetes, renal insufficiency and hypertension presents with blood pressure exacerbation. Patient reports his blood pressures usually well-controlled. He currently takes amlodipine 10 mg, atenolol 100 mg, Lotensin 40 mg, hydralazine 25 mg 3 times a day. Over the last one month he has noticed significant elevation of blood pressure. He denies taking any over-the-counter supplements or herbal preparations. He denies any associated headache or chest pain.  Patient reports following low salt diet.  Patient has history of hyperlipidemia. He previously took Lipitor but discontinued due to myalgias.  Review of Systems Negative for chest pain or headache    Past Medical History  Diagnosis Date  . DIABETES MELLITUS, TYPE II 01/02/2007  . HYPERLIPIDEMIA 06/18/2007  . HYPERTENSION 01/02/2007  . RENAL INSUFFICIENCY 02/05/2008    History   Social History  . Marital Status: Married    Spouse Name: N/A    Number of Children: N/A  . Years of Education: N/A   Occupational History  . Not on file.   Social History Main Topics  . Smoking status: Never Smoker   . Smokeless tobacco: Not on file  . Alcohol Use: No  . Drug Use: No  . Sexually Active:    Other Topics Concern  . Not on file   Social History Narrative  . No narrative on file    Past Surgical History  Procedure Laterality Date  . Shoulder surgery      left    Family History  Problem Relation Age of Onset  . Hypertension Mother   . Diabetes Mother   . Stroke Father     Allergies  Allergen Reactions  . Bactrim (Sulfamethoxazole-Tmp Ds)   . Penicillins     REACTION: urticaria (hives)  . Statins     myalgia  . Sulfa Drugs Cross Reactors Hives, Itching and Swelling    Current Outpatient Prescriptions on File Prior to Visit  Medication Sig  Dispense Refill  . amLODipine (NORVASC) 10 MG tablet Take 1 tablet by mouth daily.      . benazepril (LOTENSIN) 40 MG tablet Take 1 tablet by mouth daily.      Marland Kitchen FREESTYLE TEST STRIPS test strip USE TO TEST BLOOD SUGARS ONCE DAILY  100 each  11  . hydrALAZINE (APRESOLINE) 25 MG tablet Take 1 tablet (25 mg total) by mouth 3 (three) times daily.  300 tablet  3  . insulin aspart (NOVOLOG) 100 UNIT/ML injection Take 6 units with every meal unless blood sugar above 200 than take 10 units  3 vial  11  . insulin glargine (LANTUS) 100 UNIT/ML injection Inject 0.3 mLs (30 Units total) into the skin daily.  10 mL    . Insulin Pen Needle (PEN NEEDLES 3/16") 31G X 5 MM MISC 1 each by Does not apply route daily.  100 each  5   No current facility-administered medications on file prior to visit.    BP 200/108  Pulse 72  Temp(Src) 97.9 F (36.6 C) (Oral)  Wt 253 lb (114.76 kg)  BMI 32.06 kg/m2    Objective:   Physical Exam  Constitutional: He is oriented to person, place, and time. He appears well-developed and well-nourished.  Neck: Neck supple.  No carotid bruit  Cardiovascular: Normal rate, regular rhythm and normal heart sounds.   Pulmonary/Chest: Effort normal and  breath sounds normal. He has no wheezes.  Abdominal: Soft. Bowel sounds are normal.  Negative for abdominal bruit  Musculoskeletal: He exhibits no edema.  Neurological: He is oriented to person, place, and time. No cranial nerve deficit.  Skin: Skin is warm and dry.  Psychiatric: He has a normal mood and affect. His behavior is normal.          Assessment & Plan:

## 2012-11-02 NOTE — Assessment & Plan Note (Signed)
Patient reports exacerbation of hypertension over last 1 month. He does not have associated chest pain or headache. We discussed patient's higher risk for stroke. Discontinue atenolol. Switch to labetalol 200 mg twice daily. Also add furosemide 20 mg once daily. I stressed importance of following low salt diet. Reassess in 2 weeks.  Patient understands to seek emergency medical care if he develops any signs or symptoms of stroke.  Rule out secondary hypertension. Obtain renal artery duplex and check serum free metanephrines.

## 2012-11-02 NOTE — Patient Instructions (Addendum)
Please complete the following lab tests before your next follow up appointment: BMET, TSH, serum metanephrines - 401.9 Contact our office if you experience chest pain or headache.

## 2012-11-13 ENCOUNTER — Encounter (INDEPENDENT_AMBULATORY_CARE_PROVIDER_SITE_OTHER): Payer: Medicare Other

## 2012-11-13 DIAGNOSIS — I10 Essential (primary) hypertension: Secondary | ICD-10-CM

## 2012-11-16 ENCOUNTER — Encounter: Payer: Self-pay | Admitting: Internal Medicine

## 2012-11-16 ENCOUNTER — Ambulatory Visit (INDEPENDENT_AMBULATORY_CARE_PROVIDER_SITE_OTHER): Payer: Medicare Other | Admitting: Internal Medicine

## 2012-11-16 VITALS — BP 196/94 | HR 88 | Temp 98.3°F | Ht 75.0 in | Wt 254.0 lb

## 2012-11-16 DIAGNOSIS — I10 Essential (primary) hypertension: Secondary | ICD-10-CM

## 2012-11-16 DIAGNOSIS — N259 Disorder resulting from impaired renal tubular function, unspecified: Secondary | ICD-10-CM

## 2012-11-16 LAB — BASIC METABOLIC PANEL
CO2: 26 mEq/L (ref 19–32)
Calcium: 9 mg/dL (ref 8.4–10.5)
Creatinine, Ser: 1.7 mg/dL — ABNORMAL HIGH (ref 0.4–1.5)
GFR: 51.08 mL/min — ABNORMAL LOW (ref >60.00)
Glucose, Bld: 114 mg/dL — ABNORMAL HIGH (ref 70–99)
Sodium: 140 mEq/L (ref 135–145)

## 2012-11-16 LAB — CBC WITH DIFFERENTIAL/PLATELET
Basophils Absolute: 0 10*3/uL (ref 0.0–0.1)
Eosinophils Absolute: 0.4 10*3/uL (ref 0.0–0.7)
Lymphocytes Relative: 24.8 % (ref 12.0–46.0)
MCHC: 34 g/dL (ref 30.0–36.0)
MCV: 82.2 fl (ref 78.0–100.0)
Monocytes Absolute: 0.6 10*3/uL (ref 0.1–1.0)
Neutrophils Relative %: 62.3 % (ref 43.0–77.0)
Platelets: 255 10*3/uL (ref 150.0–400.0)
RDW: 14.3 % (ref 11.5–14.6)

## 2012-11-16 MED ORDER — LABETALOL HCL 300 MG PO TABS: 300.0000 mg | ORAL_TABLET | Freq: Two times a day (BID) | ORAL | Status: DC

## 2012-11-16 MED ORDER — FUROSEMIDE 40 MG PO TABS: 40.0000 mg | ORAL_TABLET | Freq: Every day | ORAL | Status: DC

## 2012-11-16 NOTE — Progress Notes (Signed)
Subjective:    Patient ID: [name] [name], male    DOB: 1946/01/19, 67 y.o.   MRN: [id number]  HPI  67 year old African American male with poorly controlled type 2 diabetes, renal insufficiency and malignant hypertension for followup. At previous visit patient's blood pressure medication regimen was adjusted. Atenolol discontinued and patient started on labetalol 200 mg twice daily. Renal artery Doppler completed but not available for review.  Unfortunately there has not been significant change in his blood pressure.  Patient completed renal artery Doppler at [facility] in 2012. This was negative for renal artery stenosis.  Review of Systems Negative for chest pain or headache  Past Medical History  Diagnosis Date  . DIABETES MELLITUS, TYPE II 01/02/2007  . HYPERLIPIDEMIA 06/18/2007  . HYPERTENSION 01/02/2007  . RENAL INSUFFICIENCY 02/05/2008    History   Social History  . Marital Status: Married    Spouse Name: N/A    Number of Children: N/A  . Years of Education: N/A   Occupational History  . Not on file.   Social History Main Topics  . Smoking status: Never Smoker   . Smokeless tobacco: Not on file  . Alcohol Use: No  . Drug Use: No  . Sexually Active:    Other Topics Concern  . Not on file   Social History Narrative  . No narrative on file    Past Surgical History  Procedure Laterality Date  . Shoulder surgery      left    Family History  Problem Relation Age of Onset  . Hypertension Mother   . Diabetes Mother   . Stroke Father     Allergies  Allergen Reactions  . Bactrim (Sulfamethoxazole-Tmp Ds)   . Penicillins     REACTION: urticaria (hives)  . Statins     myalgia  . Sulfa Drugs Cross Reactors Hives, Itching and Swelling    Current Outpatient Prescriptions on File Prior to Visit  Medication Sig Dispense Refill  . amLODipine (NORVASC) 10 MG tablet Take 1 tablet by mouth daily.      . benazepril (LOTENSIN) 40 MG tablet Take 1 tablet by mouth  daily.      Marland Kitchen FREESTYLE TEST STRIPS test strip USE TO TEST BLOOD SUGARS ONCE DAILY  100 each  11  . hydrALAZINE (APRESOLINE) 25 MG tablet Take 1 tablet (25 mg total) by mouth 3 (three) times daily.  300 tablet  3  . insulin aspart (NOVOLOG) 100 UNIT/ML injection Take 6 units with every meal unless blood sugar above 200 than take 10 units  3 vial  11  . insulin glargine (LANTUS) 100 UNIT/ML injection Inject 0.3 mLs (30 Units total) into the skin daily.  10 mL    . Insulin Pen Needle (PEN NEEDLES 3/16") 31G X 5 MM MISC 1 each by Does not apply route daily.  100 each  5  . pravastatin (PRAVACHOL) 40 MG tablet Take 1 tablet (40 mg total) by mouth daily.  90 tablet  1   No current facility-administered medications on file prior to visit.    BP 196/94  Pulse 88  Temp(Src) 98.3 F (36.8 C) (Oral)  Ht 6\' 3"  (1.905 m)  Wt 254 lb (115.214 kg)  BMI 31.75 kg/m2       Objective:   Physical Exam  Constitutional: He is oriented to person, place, and time. He appears well-developed and well-nourished.  HENT:  Head: Normocephalic and atraumatic.  Cardiovascular: Normal rate, regular rhythm and normal heart sounds.  Pulmonary/Chest: Effort normal and breath sounds normal. He has no wheezes.  Neurological: He is alert and oriented to person, place, and time.  Psychiatric: He has a normal mood and affect. His behavior is normal.          Assessment & Plan:

## 2012-11-16 NOTE — Assessment & Plan Note (Signed)
Patient with persistently elevated blood pressure. Increase labetalol to 300 mg twice daily. Increase furosemide to 40 mg once daily. Awaiting results of renal artery Doppler. Also awaiting serum free metanephrines. Refer to the local nephrologists.  Patient high risk for progressive renal insufficiency.  He understands to avoid NSAIDs.

## 2012-11-20 LAB — METANEPHRINES, PLASMA: Total Metanephrines-Plasma: 137 pg/mL (ref <=205)

## 2012-11-22 ENCOUNTER — Ambulatory Visit (INDEPENDENT_AMBULATORY_CARE_PROVIDER_SITE_OTHER): Payer: Medicare Other | Admitting: Internal Medicine

## 2012-11-22 ENCOUNTER — Encounter: Payer: Self-pay | Admitting: Internal Medicine

## 2012-11-22 VITALS — BP 152/90 | HR 71 | Temp 98.8°F | Resp 12 | Ht 74.5 in | Wt 251.0 lb

## 2012-11-22 DIAGNOSIS — E119 Type 2 diabetes mellitus without complications: Secondary | ICD-10-CM

## 2012-11-22 NOTE — Patient Instructions (Signed)
You can keep the Novolog only for lunch and breakfast and omit the one for breakfast. Only inject insulin with breakfast if you have a large breakfast - then inject 4 or 6 units. Check sugars before and 2h after a meal. Please return in 2 months with your sugar log.  Please join MyChart - I will release your lab results there.. Please schedule a new eye exam.

## 2012-11-22 NOTE — Progress Notes (Signed)
Patient ID: [name] [name], male   DOB: 10-19-1945, 67 y.o.   MRN: [id number]  HPI: [name] [name] is a 67 y.o.-year-old male, returning for management of DM2, dx 2004, insulin-dependent, uncontrolled, with complications (CKD 3, ED). Last visit 1 mo ago.   Last hemoglobin A1c was: Lab Results  Component Value Date   HGBA1C 11.0* 08/22/2012   >> was not taking insulin before he had this HbA1C  Pt is on a regimen of: - Lantus 20 units qhs - Novolog 6 units with lunch and dinner - not taking breakfast Novolog in last 3 days as his sugars were low-normal.   Pt checks his sugars 2 x a day and they are: - am: 90-120 >> 100-110 - before dinner: 114-130 No lows. Lowest sugar was 96; he has hypoglycemia awareness at 90s. Highest sugar was 150, which is a great improvement, at last visit, he was telling me that his higher sugar was in the upper 200s.  He drinks sweet tea made by his wife, but cut down: 2 x 8 oz glasses a day. He had nutritional classes a year ago at [facility] - he went there accompanying a friend.  He could not to gym anymore due to his high BP, he used to go 2-4 times a week at the gym, and working on the elliptical and lifting weights.  Pt does not have chronic kidney disease, last BUN/creatinine was:  Lab Results  Component Value Date   BUN 20 11/16/2012   CREATININE 1.7* 11/16/2012   Last set of lipids: Lab Results  Component Value Date   CHOL 207* 12/20/2011   HDL 39.60 12/20/2011   LDLDIRECT 103.6 12/20/2011   TRIG 311.0* 12/20/2011   CHOLHDL 5 12/20/2011   Pt's last eye exam was in 2 years. No DR, reportedly. Denies numbness and tingling in his legs.  He also has a history of hypertension, hyperlipidemia, gout, erectile dysfunction-status post TURP revision 2012, history of urinary retention, chronic prostatitis, history of cellulitis of the foot.   I reviewed pt's medications, allergies, PMH, social hx, family hx and no changes required, except as mentioned above.  Labetalol was increased to 300 mg bid, Lasix was added at 40 mg daily, stopped Atenolol.  ROS: Constitutional: + weight gain since starting NovoLog, + fatigue Eyes: + blurry vision when goes out to take a walk, not related to her sugars ENT: no sore throat, no nodules palpated in throat, no dysphagia/odynophagia, no hoarseness Cardiovascular: no CP/SOB/palpitations/+ leg swelling Respiratory: no cough/SOB Gastrointestinal: no N/V/D/C Musculoskeletal: + muscle aches/no joint aches Skin: no rashes Neurological: + tremors/numbness/tingling/dizziness Psychiatric: no depression/anxiety + difficulty with erections  PE: BP 152/90  Pulse 71  Temp(Src) 98.8 F (37.1 C) (Oral)  Resp 12  Ht 6' 2.5" (1.892 m)  Wt 251 lb (113.853 kg)  BMI 31.81 kg/m2  SpO2 97% Wt Readings from Last 3 Encounters:  11/22/12 251 lb (113.853 kg)  11/16/12 254 lb (115.214 kg)  11/02/12 253 lb (114.76 kg)   Constitutional: overweight, in NAD Eyes: PERRLA, EOMI, no exophthalmos ENT: moist mucous membranes, no thyromegaly, no cervical lymphadenopathy Cardiovascular: RRR, No MRG, peri ankle edema bilaterally Respiratory: CTA B Gastrointestinal: abdomen soft, NT, ND, BS+ Musculoskeletal: no deformities, strength intact in all 4 Skin: moist, warm, no rashes - as the vitiligo macular spot on the right side of the neck, approximately 1.5 cm in diameter  ASSESSMENT: 1. DM2, insulin-dependent, uncontrolled, with complications: - CKD 3 - ED  PLAN:  1. Patient with  long-standing diabetes, requiring lower doses of insulin, possibly type 1 diabetes (he has vitiligo and mentions that his grandmother also had it so it is possible that he has another autoimmune disorder in the form of DM1) - Patient's sugars are doing much better after starting short-acting insulin before meals, and even improved in the last month, to the point where he had to stop his breakfast NovoLog dose, since his sugars were around 100. He also  decreased his weight since last visit. - since his sugars are so good (despite not having her sugar log to go by and just going by his recall), I advised him to continue Lantus at 20 units at bedtime, and NovoLog 6 units but can continue with only 2 doses a day, with lunch and dinner - since we are just doing fine-tuning right now, I advised him to check his sugars before and 2 hours after the same meal, for a total of 2 measurements a day, but to rotate the type of meal - we'll check a hemoglobin A1c today, since it has been 3 months since his last check - I advised pt to see his ophthalmologist, and he should continue to have annual exams - I encouraged pt to join [place] in 2 months with sugar log - check sugars twice a day as advised  Office Visit on 11/22/2012  Component Date Value Range Status  . Hemoglobin A1C 11/22/2012 7.3* 4.6 - 6.5 % Final   Glycemic Control Guidelines for People with Diabetes:Non Diabetic:  <6%Goal of Therapy: <7%Additional Action Suggested:  >8%    Excellent improvement with addition of insulin: 11% >> 7.3%.

## 2012-11-29 ENCOUNTER — Encounter: Payer: Self-pay | Admitting: Internal Medicine

## 2012-11-29 ENCOUNTER — Ambulatory Visit (INDEPENDENT_AMBULATORY_CARE_PROVIDER_SITE_OTHER): Payer: Medicare Other | Admitting: Internal Medicine

## 2012-11-29 VITALS — BP 190/100 | HR 64 | Temp 98.1°F | Wt 253.0 lb

## 2012-11-29 DIAGNOSIS — I10 Essential (primary) hypertension: Secondary | ICD-10-CM

## 2012-11-29 MED ORDER — HYDRALAZINE HCL 50 MG PO TABS: 50.0000 mg | ORAL_TABLET | Freq: Three times a day (TID) | ORAL | Status: DC

## 2012-11-29 MED ORDER — LISINOPRIL 40 MG PO TABS: 40.0000 mg | ORAL_TABLET | Freq: Every day | ORAL | Status: DC

## 2012-11-29 NOTE — Assessment & Plan Note (Addendum)
Proceed with referral to nephrologist.  Change benazepril to lisinopril 40 mg.  Increase hydralazine 50 mg 3 times a day. Renal artery Doppler negative for renal artery stenosis. Serum free metanephrines are normal. Consider dexamethasone suppression test.  Consider primary aldosteronism.  Defer work up the nephrology.  Patient understands he will need repeat BMET in one week.  This is to be completed at nephrologist's office.

## 2012-11-29 NOTE — Progress Notes (Signed)
Subjective:    Patient ID: [name] [name], male    DOB: August 16, 1945, 67 y.o.   MRN: [id number]  HPI  67 year old African American male with poorly controlled type 2 diabetes, renal insufficiency and malignant hypertension for followup. Despite increasing dose of labetalol, patient's blood pressure persistently elevated. His lowest blood pressure reading at home has been 150/90. Renal artery Doppler was negative for renal artery stenosis. Serum metanephrines were normal.  Review of Systems Negative for chest pain or headache  Past Medical History  Diagnosis Date  . DIABETES MELLITUS, TYPE II 01/02/2007  . HYPERLIPIDEMIA 06/18/2007  . HYPERTENSION 01/02/2007  . RENAL INSUFFICIENCY 02/05/2008    History   Social History  . Marital Status: Married    Spouse Name: N/A    Number of Children: N/A  . Years of Education: N/A   Occupational History  . Not on file.   Social History Main Topics  . Smoking status: Never Smoker   . Smokeless tobacco: Not on file  . Alcohol Use: No  . Drug Use: No  . Sexually Active:    Other Topics Concern  . Not on file   Social History Narrative  . No narrative on file    Past Surgical History  Procedure Laterality Date  . Shoulder surgery      left    Family History  Problem Relation Age of Onset  . Hypertension Mother   . Diabetes Mother   . Stroke Father     Allergies  Allergen Reactions  . Bactrim (Sulfamethoxazole-Tmp Ds)   . Penicillins     REACTION: urticaria (hives)  . Statins     myalgia  . Sulfa Drugs Cross Reactors Hives, Itching and Swelling    Current Outpatient Prescriptions on File Prior to Visit  Medication Sig Dispense Refill  . amLODipine (NORVASC) 10 MG tablet Take 1 tablet by mouth daily.      Marland Kitchen FREESTYLE TEST STRIPS test strip USE TO TEST BLOOD SUGARS ONCE DAILY  100 each  11  . furosemide (LASIX) 40 MG tablet Take 1 tablet (40 mg total) by mouth daily.  30 tablet  2  . insulin aspart (NOVOLOG) 100 UNIT/ML  injection Take 6 units with every meal unless blood sugar above 200 than take 10 units  3 vial  11  . insulin glargine (LANTUS) 100 UNIT/ML injection Inject 0.3 mLs (30 Units total) into the skin daily.  10 mL    . Insulin Pen Needle (PEN NEEDLES 3/16") 31G X 5 MM MISC 1 each by Does not apply route daily.  100 each  5  . labetalol (NORMODYNE) 300 MG tablet Take 1 tablet (300 mg total) by mouth 2 (two) times daily.  60 tablet  2  . pravastatin (PRAVACHOL) 40 MG tablet Take 1 tablet (40 mg total) by mouth daily.  90 tablet  1   No current facility-administered medications on file prior to visit.    BP 190/100  Pulse 64  Temp(Src) 98.1 F (36.7 C) (Oral)  Wt 253 lb (114.76 kg)  BMI 32.06 kg/m2       Objective:   Physical Exam  Constitutional: He is oriented to person, place, and time. He appears well-developed and well-nourished.  HENT:  Head: Normocephalic and atraumatic.  Neck: Neck supple.  No carotid bruit  Cardiovascular: Normal rate, regular rhythm and normal heart sounds.   Pulmonary/Chest: Effort normal and breath sounds normal. He has no wheezes.  Abdominal: Soft. Bowel sounds are normal.  No  abdominal bruit  Neurological: He is alert and oriented to person, place, and time. No cranial nerve deficit.  Psychiatric: He has a normal mood and affect. His behavior is normal.          Assessment & Plan:

## 2012-12-21 ENCOUNTER — Other Ambulatory Visit: Payer: Self-pay | Admitting: Internal Medicine

## 2013-01-03 ENCOUNTER — Other Ambulatory Visit: Payer: Self-pay | Admitting: Internal Medicine

## 2013-01-10 ENCOUNTER — Other Ambulatory Visit: Payer: Self-pay | Admitting: Internal Medicine

## 2013-01-24 ENCOUNTER — Ambulatory Visit: Payer: Medicare Other | Admitting: Internal Medicine

## 2013-01-29 ENCOUNTER — Ambulatory Visit: Payer: Medicare Other | Admitting: Internal Medicine

## 2013-02-04 ENCOUNTER — Other Ambulatory Visit: Payer: Self-pay | Admitting: Internal Medicine

## 2013-02-26 ENCOUNTER — Other Ambulatory Visit: Payer: Self-pay | Admitting: Internal Medicine

## 2013-04-02 ENCOUNTER — Encounter: Payer: Self-pay | Admitting: Internal Medicine

## 2013-04-02 ENCOUNTER — Ambulatory Visit (INDEPENDENT_AMBULATORY_CARE_PROVIDER_SITE_OTHER): Payer: Medicare Other | Admitting: Internal Medicine

## 2013-04-02 ENCOUNTER — Ambulatory Visit: Payer: Medicare Other | Admitting: Internal Medicine

## 2013-04-02 VITALS — BP 174/98 | HR 80 | Temp 98.2°F | Ht 74.5 in | Wt 248.0 lb

## 2013-04-02 DIAGNOSIS — N189 Chronic kidney disease, unspecified: Secondary | ICD-10-CM

## 2013-04-02 DIAGNOSIS — I129 Hypertensive chronic kidney disease with stage 1 through stage 4 chronic kidney disease, or unspecified chronic kidney disease: Secondary | ICD-10-CM

## 2013-04-02 MED ORDER — HYDRALAZINE HCL 25 MG PO TABS: 25.0000 mg | ORAL_TABLET | Freq: Three times a day (TID) | ORAL | Status: DC

## 2013-04-02 NOTE — Progress Notes (Signed)
Comes in for htn- yesterday bp 190/110. States bp typically runs 140s/80s. States he had a cold approx 7 days ago and bp and cbgs have been elevated since- he took no otc meds  Past Medical History  Diagnosis Date  . DIABETES MELLITUS, TYPE II 01/02/2007  . HYPERLIPIDEMIA 06/18/2007  . HYPERTENSION 01/02/2007  . RENAL INSUFFICIENCY 02/05/2008    History   Social History  . Marital Status: Married    Spouse Name: N/A    Number of Children: N/A  . Years of Education: N/A   Occupational History  . Not on file.   Social History Main Topics  . Smoking status: Never Smoker   . Smokeless tobacco: Not on file  . Alcohol Use: No  . Drug Use: No  . Sexual Activity:    Other Topics Concern  . Not on file   Social History Narrative  . No narrative on file    Past Surgical History  Procedure Laterality Date  . Shoulder surgery      left    Family History  Problem Relation Age of Onset  . Hypertension Mother   . Diabetes Mother   . Stroke Father     Allergies  Allergen Reactions  . Bactrim [Sulfamethoxazole-Tmp Ds]   . Penicillins     REACTION: urticaria (hives)  . Statins     myalgia  . Sulfa Drugs Cross Reactors Hives, Itching and Swelling    Current Outpatient Prescriptions on File Prior to Visit  Medication Sig Dispense Refill  . amLODipine (NORVASC) 10 MG tablet TAKE 1 TABLET BY MOUTH EVERY DAY  90 tablet  0  . benazepril (LOTENSIN) 40 MG tablet TAKE 1 TABLET BY MOUTH EVERY DAY  90 tablet  1  . FREESTYLE TEST STRIPS test strip USE TO TEST BLOOD SUGARS ONCE DAILY  100 each  11  . furosemide (LASIX) 40 MG tablet Take 1 tablet (40 mg total) by mouth daily.  30 tablet  2  . insulin aspart (NOVOLOG) 100 UNIT/ML injection Take 6 units with every meal unless blood sugar above 200 than take 10 units  3 vial  11  . Insulin Pen Needle (PEN NEEDLES 3/16") 31G X 5 MM MISC 1 each by Does not apply route daily.  100 each  5  . pravastatin (PRAVACHOL) 40 MG tablet Take 1 tablet  (40 mg total) by mouth daily.  90 tablet  1   No current facility-administered medications on file prior to visit.     patient denies chest pain, shortness of breath, orthopnea. Denies lower extremity edema, abdominal pain, change in appetite, change in bowel movements. Patient denies rashes, musculoskeletal complaints. No other specific complaints in a complete review of systems.   BP 184/104  Pulse 80  Temp(Src) 98.2 F (36.8 C) (Oral)  Ht 6' 2.5" (1.892 m)  Wt 248 lb (112.492 kg)  BMI 31.43 kg/m2  well-developed well-nourished male in no acute distress. HEENT exam atraumatic, normocephalic, neck supple without jugular venous distention. Chest clear to auscultation cardiac exam S1-S2 are regular. Abdominal exam overweight with bowel sounds, soft and nontender. Extremities no edema. Neurologic exam is alert with a normal gait.   A/p- dm care at [facility]

## 2013-04-02 NOTE — Assessment & Plan Note (Signed)
Not controlled Add hydralazine Recheck bp next week

## 2013-04-12 ENCOUNTER — Encounter: Payer: Medicare Other | Admitting: Internal Medicine

## 2013-04-12 ENCOUNTER — Ambulatory Visit (INDEPENDENT_AMBULATORY_CARE_PROVIDER_SITE_OTHER): Payer: Medicare Other | Admitting: Internal Medicine

## 2013-04-12 ENCOUNTER — Encounter: Payer: Self-pay | Admitting: Internal Medicine

## 2013-04-12 VITALS — BP 178/88 | HR 76 | Temp 98.3°F | Wt 248.0 lb

## 2013-04-12 DIAGNOSIS — N259 Disorder resulting from impaired renal tubular function, unspecified: Secondary | ICD-10-CM

## 2013-04-12 DIAGNOSIS — I129 Hypertensive chronic kidney disease with stage 1 through stage 4 chronic kidney disease, or unspecified chronic kidney disease: Secondary | ICD-10-CM

## 2013-04-12 DIAGNOSIS — N189 Chronic kidney disease, unspecified: Secondary | ICD-10-CM

## 2013-04-12 MED ORDER — HYDRALAZINE HCL 25 MG PO TABS: ORAL_TABLET | ORAL | Status: DC

## 2013-04-12 NOTE — Progress Notes (Signed)
Chief Complaint  Patient presents with  . Hypertension    HPI: Patient comes in today for SDA for  problem evaluation.  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  Is being seen today because he came in for a prescheduled blood pressure check and his primary Dr. Lance Muss available  office visit with physician work in.  He was seen a few weeks ago and blood pressure was continued elevated was placed on hydralazine 25 mg 3 times a day. Since that time he states that it could cause bloating but otherwise feels no different. No rest chest pain or shortness of breath however when he mows the lawn he might feel this.   He states that his blood pressure had been controlled according to him pretty well up until last spring when he had the flu and it has never been quite controlled since that time.  He also states that he seemed to have been best controlled when he was on clonidine in the past but was taken off because of problems with blood sugar and gout.?  He sees Dr. Jimmy Footman  last seen in the summer has an appointment next week.  He thinks that the renal specialist took him off labetalol or that it didn't work and also was taken off hydralazine in the past.  He states that his blood sugar is in better  good control right now his blood sugars 120 this morning.  ROS: See pertinent positives and negatives per HPI.  BP 152/90 at dr Darnell Level office in may.  130 range last year.   Past Medical History  Diagnosis Date  . DIABETES MELLITUS, TYPE II 01/02/2007  . HYPERLIPIDEMIA 06/18/2007  . HYPERTENSION 01/02/2007  . RENAL INSUFFICIENCY 02/05/2008    Family History  Problem Relation Age of Onset  . Hypertension Mother   . Diabetes Mother   . Stroke Father     History   Social History  . Marital Status: Married    Spouse Name: N/A    Number of Children: N/A  . Years of Education: N/A    Social History Main Topics  . Smoking status: Never Smoker   . Smokeless tobacco: Not on file  . Alcohol Use: No  . Drug Use: No  . Sexual Activity:    Other Topics Concern  . Not on file   Social History Narrative  . No narrative on file    Outpatient Encounter Prescriptions as of 04/12/2013  Medication Sig Dispense Refill  . amLODipine (NORVASC) 10 MG tablet TAKE 1 TABLET BY MOUTH EVERY DAY  90 tablet  0  . benazepril (LOTENSIN) 40 MG tablet TAKE 1 TABLET BY MOUTH EVERY DAY  90 tablet  1  . calcitRIOL (ROCALTROL) 0.25 MCG capsule Take 1 capsule by mouth daily.      Marland Kitchen FREESTYLE TEST STRIPS test strip USE TO TEST BLOOD SUGARS ONCE DAILY  100 each  11  . furosemide (LASIX) 40 MG tablet Take 1 tablet (40 mg total) by mouth daily.  30 tablet  2  . hydrALAZINE (APRESOLINE) 25 MG tablet Take  2 in am 1 mid day and 2 in pm  50 - 25 -50 temporary dosing  90 tablet  11  . insulin aspart (NOVOLOG) 100 UNIT/ML injection Take 6 units with every meal unless blood sugar above 200 than take 10 units  3 vial  11  . Insulin Pen Needle (PEN NEEDLES 3/16")  31G X 5 MM MISC 1 each by Does not apply route daily.  100 each  5  . [DISCONTINUED] hydrALAZINE (APRESOLINE) 25 MG tablet Take 1 tablet (25 mg total) by mouth 3 (three) times daily.  90 tablet  11   No facility-administered encounter medications on file as of 04/12/2013.    EXAM:  BP 178/88  Pulse 76  Temp(Src) 98.3 F (36.8 C) (Oral)  Wt 248 lb (112.492 kg)  BMI 31.43 kg/m2  SpO2 98%  Body mass index is 31.43 kg/(m^2).  GENERAL: vitals reviewed and listed above, alert, oriented, appears well hydrated and in no acute distress well-developed well-nourished soft-spoken healthy appearing pleasant gentleman in no acute distress looks well  NECK: no obvious masses on inspection palpation no adenopathyno jvd  LUNGS: clear to auscultation bilaterally, no wheezes, rales or rhonchi, good air movement CV: HRRR, no clubbing cyanosis or   peripheral edema nl cap refill no gallops or murmurs pulses equal without delay MS: moves all extremities without noticeable focal  Abnormality good muscle mass  PSYCH: pleasant and cooperative, no obvious depression or anxiety Record review  ASSESSMENT AND PLAN: creatinine runs in the low 2 range but recently was 1.7 or so Discussed the following assessment and plan:  Hypertensive kidney disease  RENAL INSUFFICIENCY  Spent a good deal of time trying to review the electronic health record and the renal specialists scanned documents and it is very difficult to tell why medicines were off the list and switched.  Patient states that he was taken off the hydralazine and this now put back on it. No new symptoms.  Today discussed increasing the hydralazine in the short run 50 25 50 until he sees the nephrologist next week.   We'll send a note to Dr. Leanne Chang also.  Suggest he record  a list of medicines that have been tried what worked what didn't and side effects.  What  the patient tells me history-wise seems to be different or not clear from the current electronic record.  He states that he feels he did the best on clonidine but I can't tell why it was stopped. Needs FU appt with dr swords also -Patient advised to return or notify health care team  if symptoms worsen or persist or new concerns arise.  Patient Instructions  For the short run  Increase the hydralazine .   To 50 mg  Am and pm dose   And stay on the 25 mg mid day dose. For now Will send dr deterling copy of note and readings ? Of trying clonidine again   Because it worked better for Bp   As per Dr D and dr Leanne Chang.   Repeat BP readings today was  178/88     Mariann Laster K. Luie Laneve M.D.  Total visit 30 mins > 50% spent counseling and coordinating care

## 2013-04-12 NOTE — Patient Instructions (Signed)
For the short run  Increase the hydralazine .   To 50 mg  Am and pm dose   And stay on the 25 mg mid day dose. For now Will send dr deterling copy of note and readings ? Of trying clonidine again   Because it worked better for Bp   As per Dr D and dr Leanne Chang.   Repeat BP readings today was  178/88

## 2013-04-23 ENCOUNTER — Other Ambulatory Visit: Payer: Self-pay | Admitting: Internal Medicine

## 2013-05-27 ENCOUNTER — Telehealth: Payer: Self-pay

## 2013-05-27 NOTE — Telephone Encounter (Signed)
Spoke with pt and he is aware that he needs to have an LDL drawn for the [employer] audit. Pt states he is seeing Dr. Leanne Chang next week and he could get this drawn then. Note sent to Dahlia Byes also.

## 2013-06-05 ENCOUNTER — Ambulatory Visit: Payer: Medicare Other | Admitting: Internal Medicine

## 2013-06-24 ENCOUNTER — Ambulatory Visit (INDEPENDENT_AMBULATORY_CARE_PROVIDER_SITE_OTHER): Payer: Medicare Other | Admitting: Internal Medicine

## 2013-06-24 ENCOUNTER — Encounter: Payer: Self-pay | Admitting: Internal Medicine

## 2013-06-24 VITALS — BP 176/94 | HR 96 | Temp 98.6°F | Ht 74.5 in | Wt 247.0 lb

## 2013-06-24 DIAGNOSIS — E785 Hyperlipidemia, unspecified: Secondary | ICD-10-CM

## 2013-06-24 DIAGNOSIS — E119 Type 2 diabetes mellitus without complications: Secondary | ICD-10-CM

## 2013-06-24 DIAGNOSIS — N259 Disorder resulting from impaired renal tubular function, unspecified: Secondary | ICD-10-CM

## 2013-06-24 LAB — HEPATIC FUNCTION PANEL
AST: 18 U/L (ref 0–37)
Albumin: 4.4 g/dL (ref 3.5–5.2)
Alkaline Phosphatase: 68 U/L (ref 39–117)
Bilirubin, Direct: 0.1 mg/dL (ref 0.0–0.3)
Total Bilirubin: 0.7 mg/dL (ref 0.3–1.2)
Total Protein: 7.6 g/dL (ref 6.0–8.3)

## 2013-06-24 LAB — LIPID PANEL
Cholesterol: 268 mg/dL — ABNORMAL HIGH (ref 0–200)
Total CHOL/HDL Ratio: 7
VLDL: 52.6 mg/dL — ABNORMAL HIGH (ref 0.0–40.0)

## 2013-06-24 MED ORDER — GLUCOSE BLOOD VI STRP: 1.0000 | ORAL_STRIP | Freq: Every day | Status: DC

## 2013-06-24 MED ORDER — FUROSEMIDE 80 MG PO TABS: ORAL_TABLET | ORAL | Status: DC

## 2013-06-24 MED ORDER — LABETALOL HCL 200 MG PO TABS: 200.0000 mg | ORAL_TABLET | Freq: Two times a day (BID) | ORAL | Status: DC

## 2013-06-24 NOTE — Progress Notes (Signed)
Pre visit review using our clinic review tool, if applicable. No additional management support is needed unless otherwise documented below in the visit note. 

## 2013-06-24 NOTE — Progress Notes (Signed)
Trouble with HTN- reviewed nephrology notes and notes form this clinic.  He feels well and has no complaints except he feels as if he "tires" more easily (yard work). No chest pain, PND, orthopnea.   Home BPs- ([facility])- 170-180/90s. Best sbp= 178mmHg.  Type 2 diabetes. Patient checks his blood sugars regularly. He can be in the 110-150 range. He has no other specific complaints  Past Medical History  Diagnosis Date  . DIABETES MELLITUS, TYPE II 01/02/2007  . HYPERLIPIDEMIA 06/18/2007  . HYPERTENSION 01/02/2007  . RENAL INSUFFICIENCY 02/05/2008    History   Social History  . Marital Status: Married    Spouse Name: N/A    Number of Children: N/A  . Years of Education: N/A   Occupational History  . Not on file.   Social History Main Topics  . Smoking status: Never Smoker   . Smokeless tobacco: Not on file  . Alcohol Use: No  . Drug Use: No  . Sexual Activity:    Other Topics Concern  . Not on file   Social History Narrative  . No narrative on file    Past Surgical History  Procedure Laterality Date  . Shoulder surgery      left    Family History  Problem Relation Age of Onset  . Hypertension Mother   . Diabetes Mother   . Stroke Father     Allergies  Allergen Reactions  . Bactrim [Sulfamethoxazole-Tmp Ds]   . Penicillins     REACTION: urticaria (hives)  . Statins     myalgia  . Sulfa Drugs Cross Reactors Hives, Itching and Swelling    Current Outpatient Prescriptions on File Prior to Visit  Medication Sig Dispense Refill  . amLODipine (NORVASC) 10 MG tablet TAKE 1 TABLET BY MOUTH EVERY DAY  90 tablet  0  . benazepril (LOTENSIN) 40 MG tablet TAKE 1 TABLET BY MOUTH EVERY DAY  90 tablet  1  . calcitRIOL (ROCALTROL) 0.25 MCG capsule Take 1 capsule by mouth daily.      . insulin aspart (NOVOLOG) 100 UNIT/ML injection Take 6 units with every meal unless blood sugar above 200 than take 10 units  3 vial  11  . Insulin Pen Needle (PEN NEEDLES 3/16") 31G X 5  MM MISC 1 each by Does not apply route daily.  100 each  5  . LANTUS SOLOSTAR 100 UNIT/ML SOPN INJECT 20 UNITS SUBCUTANEOUSLY DAILY  15 mL  0   No current facility-administered medications on file prior to visit.     patient denies chest pain, shortness of breath, orthopnea. Denies lower extremity edema, abdominal pain, change in appetite, change in bowel movements. Patient denies rashes, musculoskeletal complaints. No other specific complaints in a complete review of systems.   BP 176/94  Pulse 96  Temp(Src) 98.6 F (37 C) (Oral)  Ht 6' 2.5" (1.892 m)  Wt 247 lb (112.038 kg)  BMI 31.30 kg/m2  well-developed well-nourished male in no acute distress. HEENT exam atraumatic, normocephalic, neck supple without jugular venous distention. Chest clear to auscultation cardiac exam S1-S2 are regular. Abdominal exam overweight with bowel sounds, soft and nontender. Extremities no edema. Neurologic exam is alert with a normal gait.

## 2013-06-26 NOTE — Assessment & Plan Note (Signed)
Will check laboratory work today. He is intolerant to statins.

## 2013-06-26 NOTE — Assessment & Plan Note (Signed)
He has regular followup with nephrology.

## 2013-06-26 NOTE — Assessment & Plan Note (Signed)
Will check laboratory work today. He needs to lose weight. Goal BMI should be less than 25. I've advised him to start exercising.

## 2013-07-02 ENCOUNTER — Other Ambulatory Visit: Payer: Self-pay | Admitting: Internal Medicine

## 2013-07-02 DIAGNOSIS — E119 Type 2 diabetes mellitus without complications: Secondary | ICD-10-CM

## 2013-07-04 LAB — HM DIABETES EYE EXAM

## 2013-07-23 ENCOUNTER — Encounter: Payer: Self-pay | Admitting: *Deleted

## 2013-08-26 ENCOUNTER — Ambulatory Visit: Payer: Medicare Other | Admitting: Internal Medicine

## 2013-09-05 ENCOUNTER — Other Ambulatory Visit: Payer: Self-pay | Admitting: Internal Medicine

## 2013-09-30 ENCOUNTER — Ambulatory Visit (INDEPENDENT_AMBULATORY_CARE_PROVIDER_SITE_OTHER): Payer: Medicare Other | Admitting: Internal Medicine

## 2013-09-30 ENCOUNTER — Encounter: Payer: Self-pay | Admitting: Internal Medicine

## 2013-09-30 VITALS — BP 138/83 | HR 76 | Temp 98.5°F | Ht 74.5 in | Wt 244.0 lb

## 2013-09-30 DIAGNOSIS — I798 Other disorders of arteries, arterioles and capillaries in diseases classified elsewhere: Secondary | ICD-10-CM

## 2013-09-30 DIAGNOSIS — E1159 Type 2 diabetes mellitus with other circulatory complications: Secondary | ICD-10-CM

## 2013-09-30 DIAGNOSIS — N259 Disorder resulting from impaired renal tubular function, unspecified: Secondary | ICD-10-CM

## 2013-09-30 DIAGNOSIS — E119 Type 2 diabetes mellitus without complications: Secondary | ICD-10-CM

## 2013-09-30 DIAGNOSIS — E785 Hyperlipidemia, unspecified: Secondary | ICD-10-CM

## 2013-09-30 LAB — BASIC METABOLIC PANEL
BUN: 21 mg/dL (ref 6–23)
CALCIUM: 9.3 mg/dL (ref 8.4–10.5)
CO2: 29 meq/L (ref 19–32)
CREATININE: 1.8 mg/dL — AB (ref 0.4–1.5)
Chloride: 101 mEq/L (ref 96–112)
GFR: 47.45 mL/min — ABNORMAL LOW (ref >60.00)
Glucose, Bld: 192 mg/dL — ABNORMAL HIGH (ref 70–99)
Potassium: 3 mEq/L — ABNORMAL LOW (ref 3.5–5.1)
Sodium: 137 mEq/L (ref 135–145)

## 2013-09-30 LAB — HEPATIC FUNCTION PANEL
ALT: 18 U/L (ref 0–53)
AST: 21 U/L (ref 0–37)
Albumin: 4.3 g/dL (ref 3.5–5.2)
Alkaline Phosphatase: 64 U/L (ref 39–117)
BILIRUBIN TOTAL: 0.6 mg/dL (ref 0.3–1.2)
Bilirubin, Direct: 0 mg/dL (ref 0.0–0.3)
TOTAL PROTEIN: 7.3 g/dL (ref 6.0–8.3)

## 2013-09-30 LAB — MICROALBUMIN / CREATININE URINE RATIO
Creatinine,U: 73.7 mg/dL
MICROALB UR: 2.4 mg/dL — AB (ref 0.0–1.9)
Microalb Creat Ratio: 3.3 mg/g (ref 0.0–30.0)

## 2013-09-30 LAB — LIPID PANEL
CHOL/HDL RATIO: 7
Cholesterol: 262 mg/dL — ABNORMAL HIGH (ref 0–200)
HDL: 38.2 mg/dL — AB (ref >39.00)
LDL Cholesterol: 174 mg/dL — ABNORMAL HIGH (ref 0–99)
Triglycerides: 247 mg/dL — ABNORMAL HIGH (ref 0.0–149.0)
VLDL: 49.4 mg/dL — ABNORMAL HIGH (ref 0.0–40.0)

## 2013-09-30 LAB — HEMOGLOBIN A1C: Hgb A1c MFr Bld: 9.4 % — ABNORMAL HIGH (ref 4.6–6.5)

## 2013-09-30 LAB — HM DIABETES FOOT EXAM

## 2013-09-30 NOTE — Progress Notes (Signed)
Feels well, no complaints  Dm- not controlled Not exercising regularly  htn- no home bps  Past Medical History  Diagnosis Date  . DIABETES MELLITUS, TYPE II 01/02/2007  . HYPERLIPIDEMIA 06/18/2007  . HYPERTENSION 01/02/2007  . RENAL INSUFFICIENCY 02/05/2008    History   Social History  . Marital Status: Married    Spouse Name: N/A    Number of Children: N/A  . Years of Education: N/A   Occupational History  . Not on file.   Social History Main Topics  . Smoking status: Never Smoker   . Smokeless tobacco: Not on file  . Alcohol Use: No  . Drug Use: No  . Sexual Activity:    Other Topics Concern  . Not on file   Social History Narrative  . No narrative on file    Past Surgical History  Procedure Laterality Date  . Shoulder surgery      left    Family History  Problem Relation Age of Onset  . Hypertension Mother   . Diabetes Mother   . Stroke Father     Allergies  Allergen Reactions  . Bactrim [Sulfamethoxazole-Tmp Ds]   . Penicillins     REACTION: urticaria (hives)  . Statins     myalgia  . Sulfa Drugs Cross Reactors Hives, Itching and Swelling    Current Outpatient Prescriptions on File Prior to Visit  Medication Sig Dispense Refill  . amLODipine (NORVASC) 10 MG tablet TAKE 1 TABLET BY MOUTH EVERY DAY  90 tablet  0  . benazepril (LOTENSIN) 40 MG tablet TAKE 1 TABLET BY MOUTH EVERY DAY  90 tablet  1  . calcitRIOL (ROCALTROL) 0.25 MCG capsule Take 1 capsule by mouth daily.      . furosemide (LASIX) 80 MG tablet 160 mg po q a.m and 80 mg po q afternoon  270 tablet  3  . glucose blood (ONETOUCH VERIO) test strip 1 each by Other route daily. Use as instructed  100 each  11  . hydrALAZINE (APRESOLINE) 25 MG tablet Take 25 mg by mouth 3 (three) times daily.      . insulin aspart (NOVOLOG) 100 UNIT/ML injection Take 6 units with every meal unless blood sugar above 200 than take 10 units  3 vial  11  . Insulin Pen Needle (PEN NEEDLES 3/16") 31G X 5 MM MISC 1  each by Does not apply route daily.  100 each  5  . labetalol (NORMODYNE) 200 MG tablet Take 1 tablet (200 mg total) by mouth 2 (two) times daily.  180 tablet  3  . LANTUS SOLOSTAR 100 UNIT/ML Solostar Pen INJECT 20 UNITS SUBCUTANEOUSLY DAILY  15 mL  0  . potassium chloride SA (K-DUR,KLOR-CON) 20 MEQ tablet Take 1 tablet by mouth daily.       No current facility-administered medications on file prior to visit.     patient denies chest pain, shortness of breath, orthopnea. Denies lower extremity edema, abdominal pain, change in appetite, change in bowel movements. Patient denies rashes, musculoskeletal complaints. No other specific complaints in a complete review of systems.   BP 164/98  Pulse 76  Temp(Src) 98.5 F (36.9 C) (Oral)  Ht 6' 2.5" (1.892 m)  Wt 244 lb (110.678 kg)  BMI 30.92 kg/m2  well-developed well-nourished male in no acute distress. HEENT exam atraumatic, normocephalic, neck supple without jugular venous distention. Chest clear to auscultation cardiac exam S1-S2 are regular. Abdominal exam overweight with bowel sounds, soft and nontender. Extremities no edema. Neurologic  exam is alert with a normal gait.

## 2013-09-30 NOTE — Progress Notes (Signed)
Pre visit review using our clinic review tool, if applicable. No additional management support is needed unless otherwise documented below in the visit note. 

## 2013-10-01 NOTE — Assessment & Plan Note (Signed)
He is intolerant to statins. 

## 2013-10-01 NOTE — Assessment & Plan Note (Signed)
He has regular followup with nephrology.

## 2013-10-01 NOTE — Assessment & Plan Note (Signed)
Not controlled. He does not want to see an endocrinologist. I will have him see a health coach at the [facility].

## 2013-10-11 ENCOUNTER — Telehealth: Payer: Self-pay

## 2013-10-11 NOTE — Telephone Encounter (Signed)
Relevant patient education assigned to patient using Emmi. ° °

## 2013-10-15 ENCOUNTER — Telehealth: Payer: Self-pay | Admitting: Internal Medicine

## 2013-10-15 ENCOUNTER — Other Ambulatory Visit: Payer: Self-pay

## 2013-10-15 DIAGNOSIS — E1110 Type 2 diabetes mellitus with ketoacidosis without coma: Secondary | ICD-10-CM

## 2013-10-15 DIAGNOSIS — I10 Essential (primary) hypertension: Secondary | ICD-10-CM

## 2013-10-15 DIAGNOSIS — E785 Hyperlipidemia, unspecified: Secondary | ICD-10-CM

## 2013-10-15 DIAGNOSIS — E119 Type 2 diabetes mellitus without complications: Secondary | ICD-10-CM

## 2013-10-15 NOTE — Telephone Encounter (Signed)
Pt returning your call about labs

## 2013-10-17 NOTE — Telephone Encounter (Signed)
See result note.  

## 2013-10-21 ENCOUNTER — Other Ambulatory Visit (INDEPENDENT_AMBULATORY_CARE_PROVIDER_SITE_OTHER): Payer: Medicare Other

## 2013-10-21 ENCOUNTER — Other Ambulatory Visit: Payer: Medicare Other

## 2013-10-21 DIAGNOSIS — E119 Type 2 diabetes mellitus without complications: Secondary | ICD-10-CM

## 2013-10-21 DIAGNOSIS — E785 Hyperlipidemia, unspecified: Secondary | ICD-10-CM

## 2013-10-21 DIAGNOSIS — E1310 Other specified diabetes mellitus with ketoacidosis without coma: Secondary | ICD-10-CM

## 2013-10-21 DIAGNOSIS — E1110 Type 2 diabetes mellitus with ketoacidosis without coma: Secondary | ICD-10-CM

## 2013-10-21 DIAGNOSIS — I10 Essential (primary) hypertension: Secondary | ICD-10-CM

## 2013-10-21 LAB — HEPATIC FUNCTION PANEL
ALK PHOS: 57 U/L (ref 39–117)
ALT: 18 U/L (ref 0–53)
AST: 23 U/L (ref 0–37)
Albumin: 3.7 g/dL (ref 3.5–5.2)
BILIRUBIN DIRECT: 0 mg/dL (ref 0.0–0.3)
BILIRUBIN TOTAL: 0.8 mg/dL (ref 0.3–1.2)
TOTAL PROTEIN: 6.8 g/dL (ref 6.0–8.3)

## 2013-10-21 LAB — BASIC METABOLIC PANEL
BUN: 22 mg/dL (ref 6–23)
CALCIUM: 9.1 mg/dL (ref 8.4–10.5)
CO2: 27 mEq/L (ref 19–32)
Chloride: 108 mEq/L (ref 96–112)
Creatinine, Ser: 1.7 mg/dL — ABNORMAL HIGH (ref 0.4–1.5)
GFR: 51.62 mL/min — AB (ref >60.00)
GLUCOSE: 164 mg/dL — AB (ref 70–99)
POTASSIUM: 3.3 meq/L — AB (ref 3.5–5.1)
Sodium: 141 mEq/L (ref 135–145)

## 2013-10-21 LAB — LIPID PANEL
CHOLESTEROL: 244 mg/dL — AB (ref 0–200)
HDL: 39 mg/dL — AB (ref >39.00)
LDL Cholesterol: 179 mg/dL — ABNORMAL HIGH (ref 0–99)
TRIGLYCERIDES: 132 mg/dL (ref 0.0–149.0)
Total CHOL/HDL Ratio: 6
VLDL: 26.4 mg/dL (ref 0.0–40.0)

## 2013-10-21 LAB — HEMOGLOBIN A1C: Hgb A1c MFr Bld: 8.8 % — ABNORMAL HIGH (ref 4.6–6.5)

## 2013-11-07 ENCOUNTER — Other Ambulatory Visit: Payer: Self-pay | Admitting: Internal Medicine

## 2013-11-26 ENCOUNTER — Other Ambulatory Visit: Payer: Self-pay | Admitting: Internal Medicine

## 2013-12-31 ENCOUNTER — Other Ambulatory Visit: Payer: Self-pay | Admitting: Internal Medicine

## 2014-01-17 ENCOUNTER — Other Ambulatory Visit: Payer: Medicare Other

## 2014-01-31 ENCOUNTER — Telehealth: Payer: Self-pay

## 2014-01-31 NOTE — Telephone Encounter (Signed)
Diabetic bundle- called pt to schedule an appt for lipid panel.  Left a message for pt to return call to set up appt.

## 2014-02-13 ENCOUNTER — Other Ambulatory Visit (INDEPENDENT_AMBULATORY_CARE_PROVIDER_SITE_OTHER): Payer: Medicare Other

## 2014-02-13 DIAGNOSIS — IMO0001 Reserved for inherently not codable concepts without codable children: Secondary | ICD-10-CM

## 2014-02-13 DIAGNOSIS — E1165 Type 2 diabetes mellitus with hyperglycemia: Principal | ICD-10-CM

## 2014-02-13 LAB — MICROALBUMIN / CREATININE URINE RATIO
Creatinine,U: 55.4 mg/dL
Microalb Creat Ratio: 4 mg/g (ref 0.0–30.0)
Microalb, Ur: 2.2 mg/dL — ABNORMAL HIGH (ref 0.0–1.9)

## 2014-02-13 LAB — HEMOGLOBIN A1C: Hgb A1c MFr Bld: 9.1 % — ABNORMAL HIGH (ref 4.6–6.5)

## 2014-02-25 ENCOUNTER — Other Ambulatory Visit: Payer: Self-pay | Admitting: Internal Medicine

## 2014-05-05 ENCOUNTER — Other Ambulatory Visit: Payer: Self-pay | Admitting: Internal Medicine

## 2014-05-26 ENCOUNTER — Other Ambulatory Visit: Payer: Self-pay | Admitting: Internal Medicine

## 2014-07-28 ENCOUNTER — Other Ambulatory Visit: Payer: Self-pay | Admitting: *Deleted

## 2014-07-28 MED ORDER — LABETALOL HCL 200 MG PO TABS: 200.0000 mg | ORAL_TABLET | Freq: Two times a day (BID) | ORAL | Status: DC

## 2014-08-25 DIAGNOSIS — I10 Essential (primary) hypertension: Secondary | ICD-10-CM | POA: Diagnosis not present

## 2014-08-25 DIAGNOSIS — N183 Chronic kidney disease, stage 3 (moderate): Secondary | ICD-10-CM | POA: Diagnosis not present

## 2014-08-25 DIAGNOSIS — D631 Anemia in chronic kidney disease: Secondary | ICD-10-CM | POA: Diagnosis not present

## 2014-08-25 DIAGNOSIS — N2581 Secondary hyperparathyroidism of renal origin: Secondary | ICD-10-CM | POA: Diagnosis not present

## 2014-09-16 DIAGNOSIS — I10 Essential (primary) hypertension: Secondary | ICD-10-CM | POA: Diagnosis not present

## 2014-10-01 DIAGNOSIS — I10 Essential (primary) hypertension: Secondary | ICD-10-CM | POA: Diagnosis not present

## 2014-10-01 DIAGNOSIS — N183 Chronic kidney disease, stage 3 (moderate): Secondary | ICD-10-CM | POA: Diagnosis not present

## 2014-10-01 DIAGNOSIS — N2581 Secondary hyperparathyroidism of renal origin: Secondary | ICD-10-CM | POA: Diagnosis not present

## 2014-10-15 DIAGNOSIS — N183 Chronic kidney disease, stage 3 (moderate): Secondary | ICD-10-CM | POA: Diagnosis not present

## 2014-10-15 DIAGNOSIS — I10 Essential (primary) hypertension: Secondary | ICD-10-CM | POA: Diagnosis not present

## 2014-11-14 DIAGNOSIS — I129 Hypertensive chronic kidney disease with stage 1 through stage 4 chronic kidney disease, or unspecified chronic kidney disease: Secondary | ICD-10-CM | POA: Diagnosis not present

## 2014-11-14 DIAGNOSIS — N183 Chronic kidney disease, stage 3 (moderate): Secondary | ICD-10-CM | POA: Diagnosis not present

## 2014-11-14 DIAGNOSIS — E1129 Type 2 diabetes mellitus with other diabetic kidney complication: Secondary | ICD-10-CM | POA: Diagnosis not present

## 2014-11-14 DIAGNOSIS — N2581 Secondary hyperparathyroidism of renal origin: Secondary | ICD-10-CM | POA: Diagnosis not present

## 2014-11-14 DIAGNOSIS — D631 Anemia in chronic kidney disease: Secondary | ICD-10-CM | POA: Diagnosis not present

## 2015-02-19 ENCOUNTER — Encounter: Payer: Self-pay | Admitting: Family Medicine

## 2015-02-19 ENCOUNTER — Ambulatory Visit (INDEPENDENT_AMBULATORY_CARE_PROVIDER_SITE_OTHER): Payer: Medicare Other | Admitting: Family Medicine

## 2015-02-19 VITALS — BP 136/82 | HR 95 | Temp 99.0°F | Ht 74.5 in | Wt 234.4 lb

## 2015-02-19 DIAGNOSIS — J069 Acute upper respiratory infection, unspecified: Secondary | ICD-10-CM | POA: Diagnosis not present

## 2015-02-19 NOTE — Patient Instructions (Addendum)
Transfer visit with new provider next available  INSTRUCTIONS FOR UPPER RESPIRATORY INFECTION:  -plenty of rest and fluids  -nasal saline wash 2-3 times daily (use prepackaged nasal saline or bottled/distilled water if making your own)   -can use AFRIN nasal spray for drainage and nasal congestion - but do NOT use longer then 3-4 days  -can use tylenol (in no history of liver disease) or ibuprofen (if no history of kidney disease, bowel bleeding or significant heart disease) as directed for aches and sorethroat  -in the winter time, using a humidifier at night is helpful (please follow cleaning instructions)  -if you are taking a cough medication - use only as directed, may also try a teaspoon of honey to coat the throat and throat lozenges. If given a cough medication with codeine or hydrocodone or other narcotic please be advised that this contains a strong and  potentially addicting medication. Please follow instructions carefully, take as little as possible and only use AS NEEDED for severe cough. Discuss potential side effects with your pharmacy. Please do not drive or operate machinery while taking these types of medications. Please do not take other sedating medications, drugs or alcohol while taking this medication without discussing with your doctor.  -for sore throat, salt water gargles can help  -follow up if you have fevers, facial pain, tooth pain, difficulty breathing or are worsening or symptoms persist longer then expected  Upper Respiratory Infection, Adult An upper respiratory infection (URI) is also known as the common cold. It is often caused by a type of germ (virus). Colds are easily spread (contagious). You can pass it to others by kissing, coughing, sneezing, or drinking out of the same glass. Usually, you get better in 1 to 3  weeks.  However, the cough can last for even longer. HOME CARE   Only take medicine as told by your doctor. Follow instructions provided  above.  Drink enough water and fluids to keep your pee (urine) clear or pale yellow.  Get plenty of rest.  Return to work when your temperature is < 100 for 24 hours or as told by your doctor. You may use a face mask and wash your hands to stop your cold from spreading. GET HELP RIGHT AWAY IF:   After the first few days, you feel you are getting worse.  You have questions about your medicine.  You have chills, shortness of breath, or red spit (mucus).  You have pain in the face for more then 1-2 days, especially when you bend forward.  You have a fever, puffy (swollen) neck, pain when you swallow, or white spots in the back of your throat.  You have a bad headache, ear pain, sinus pain, or chest pain.  You have a high-pitched whistling sound when you breathe in and out (wheezing).  You cough up blood.  You have sore muscles or a stiff neck. MAKE SURE YOU:   Understand these instructions.  Will watch your condition.  Will get help right away if you are not doing well or get worse. Document Released: 12/07/2007 Document Revised: 09/12/2011 Document Reviewed: 09/25/2013 [facility] Patient Information 2015 [place], [place]. This information is not intended to replace advice given to you by your health care provider. Make sure you discuss any questions you have with your health care provider.

## 2015-02-19 NOTE — Progress Notes (Signed)
HPI:  URI: -started: a few days ago -symptoms:nasal congestion, sore throat, cough, watery eyes -denies:fever, SOB, NVD, tooth pain -has tried: coricidin OTC, OTC cough syrup -sick contacts/travel/risks: denies flu exposure, tick exposure or or Ebola risks  He reports he is checking his blood sugar on a regular basis and report levels have been stable. He reports he sees his kidney doctor on a regular basis. Reports his nephrologist has been refilling his diabetes and blood pressure medications.  No PCP currently  ROS: See pertinent positives and negatives per HPI.  Past Medical History  Diagnosis Date  . DIABETES MELLITUS, TYPE II 01/02/2007  . HYPERLIPIDEMIA 06/18/2007  . HYPERTENSION 01/02/2007  . RENAL INSUFFICIENCY 02/05/2008    Past Surgical History  Procedure Laterality Date  . Shoulder surgery      left    Family History  Problem Relation Age of Onset  . Hypertension Mother   . Diabetes Mother   . Stroke Father     Social History   Social History  . Marital Status: Married    Spouse Name: N/A  . Number of Children: N/A  . Years of Education: N/A   Social History Main Topics  . Smoking status: Never Smoker   . Smokeless tobacco: None  . Alcohol Use: No  . Drug Use: No  . Sexual Activity: Not Asked   Other Topics Concern  . None   Social History Narrative     Current outpatient prescriptions:  .  amLODipine (NORVASC) 10 MG tablet, TAKE 1 TABLET BY MOUTH EVERY DAY, Disp: 90 tablet, Rfl: 0 .  B-D UF III MINI PEN NEEDLES 31G X 5 MM MISC, USE AS DIRECTED, Disp: 100 each, Rfl: 11 .  benazepril (LOTENSIN) 40 MG tablet, TAKE 1 TABLET BY MOUTH EVERY DAY, Disp: 90 tablet, Rfl: 1 .  calcitRIOL (ROCALTROL) 0.25 MCG capsule, Take 1 capsule by mouth daily., Disp: , Rfl:  .  furosemide (LASIX) 80 MG tablet, 160 mg po q a.m and 80 mg po q afternoon, Disp: 270 tablet, Rfl: 3 .  glucose blood (ONETOUCH VERIO) test strip, 1 each by Other route daily. Use as  instructed, Disp: 100 each, Rfl: 11 .  hydrALAZINE (APRESOLINE) 25 MG tablet, TAKE 1 TABLET BY MOUTH THREE TIMES DAILY, Disp: 90 tablet, Rfl: 3 .  insulin aspart (NOVOLOG) 100 UNIT/ML injection, Take 6 units with every meal unless blood sugar above 200 than take 10 units, Disp: 3 vial, Rfl: 11 .  labetalol (NORMODYNE) 200 MG tablet, Take 1 tablet (200 mg total) by mouth 2 (two) times daily., Disp: 180 tablet, Rfl: 0 .  LANTUS SOLOSTAR 100 UNIT/ML Solostar Pen, INJECT 20 UNITS UNDER THE SKIN ONCE DAILY, Disp: 15 mL, Rfl: 5 .  potassium chloride SA (K-DUR,KLOR-CON) 20 MEQ tablet, Take 1 tablet by mouth daily., Disp: , Rfl:   EXAM:  Filed Vitals:   02/19/15 1435  BP: 136/82  Pulse: 95  Temp: 99 F (37.2 C)    Body mass index is 29.7 kg/(m^2).  GENERAL: vitals reviewed and listed above, alert, oriented, appears well hydrated and in no acute distress  HEENT: atraumatic, conjunttiva clear, no obvious abnormalities on inspection of external nose and ears, normal appearance of ear canals and TMs, clear nasal congestion, mild post oropharyngeal erythema with PND, no tonsillar edema or exudate, no sinus TTP  NECK: no obvious masses on inspection  LUNGS: clear to auscultation bilaterally, no wheezes, rales or rhonchi, good air movement  CV: HRRR, no peripheral  edema  MS: moves all extremities without noticeable abnormality  PSYCH: pleasant and cooperative, no obvious depression or anxiety  ASSESSMENT AND PLAN:  Discussed the following assessment and plan:  Acute upper respiratory infection  -given HPI and exam findings today, a serious infection or illness is unlikely. We discussed potential etiologies, with VURI being most likely, and advised supportive care and monitoring. We discussed treatment side effects, likely course, antibiotic misuse, transmission, and signs of developing a serious illness. -of course, we advised to return or notify a doctor immediately if symptoms worsen or  persist or new concerns arise.  Advised he needs PCP and regular follow up for his chornic medical conditions - advised of options and had assistant schedule him a transfer visit with an available provider.  Patient Instructions  Transfer visit with new provider next available  INSTRUCTIONS FOR UPPER RESPIRATORY INFECTION:  -plenty of rest and fluids  -nasal saline wash 2-3 times daily (use prepackaged nasal saline or bottled/distilled water if making your own)   -can use AFRIN nasal spray for drainage and nasal congestion - but do NOT use longer then 3-4 days  -can use tylenol (in no history of liver disease) or ibuprofen (if no history of kidney disease, bowel bleeding or significant heart disease) as directed for aches and sorethroat  -in the winter time, using a humidifier at night is helpful (please follow cleaning instructions)  -if you are taking a cough medication - use only as directed, may also try a teaspoon of honey to coat the throat and throat lozenges. If given a cough medication with codeine or hydrocodone or other narcotic please be advised that this contains a strong and  potentially addicting medication. Please follow instructions carefully, take as little as possible and only use AS NEEDED for severe cough. Discuss potential side effects with your pharmacy. Please do not drive or operate machinery while taking these types of medications. Please do not take other sedating medications, drugs or alcohol while taking this medication without discussing with your doctor.  -for sore throat, salt water gargles can help  -follow up if you have fevers, facial pain, tooth pain, difficulty breathing or are worsening or symptoms persist longer then expected  Upper Respiratory Infection, Adult An upper respiratory infection (URI) is also known as the common cold. It is often caused by a type of germ (virus). Colds are easily spread (contagious). You can pass it to others by kissing,  coughing, sneezing, or drinking out of the same glass. Usually, you get better in 1 to 3  weeks.  However, the cough can last for even longer. HOME CARE   Only take medicine as told by your doctor. Follow instructions provided above.  Drink enough water and fluids to keep your pee (urine) clear or pale yellow.  Get plenty of rest.  Return to work when your temperature is < 100 for 24 hours or as told by your doctor. You may use a face mask and wash your hands to stop your cold from spreading. GET HELP RIGHT AWAY IF:   After the first few days, you feel you are getting worse.  You have questions about your medicine.  You have chills, shortness of breath, or red spit (mucus).  You have pain in the face for more then 1-2 days, especially when you bend forward.  You have a fever, puffy (swollen) neck, pain when you swallow, or white spots in the back of your throat.  You have a bad headache, ear  pain, sinus pain, or chest pain.  You have a high-pitched whistling sound when you breathe in and out (wheezing).  You cough up blood.  You have sore muscles or a stiff neck. MAKE SURE YOU:   Understand these instructions.  Will watch your condition.  Will get help right away if you are not doing well or get worse. Document Released: 12/07/2007 Document Revised: 09/12/2011 Document Reviewed: 09/25/2013 [facility] Patient Information 2015 [place], [place]. This information is not intended to replace advice given to you by your health care provider. Make sure you discuss any questions you have with your health care provider.      Colin Benton R.

## 2015-02-19 NOTE — Progress Notes (Signed)
Pre visit review using our clinic review tool, if applicable. No additional management support is needed unless otherwise documented below in the visit note. 

## 2015-03-02 ENCOUNTER — Other Ambulatory Visit: Payer: Self-pay | Admitting: Internal Medicine

## 2015-03-02 ENCOUNTER — Telehealth: Payer: Self-pay | Admitting: Internal Medicine

## 2015-03-02 MED ORDER — INSULIN GLARGINE 100 UNIT/ML SOLOSTAR PEN: PEN_INJECTOR | SUBCUTANEOUS | Status: DC

## 2015-03-02 NOTE — Telephone Encounter (Signed)
Pt request refill of the following med  LANTUS SOLOSTAR 100 UNIT/ML Solostar Pen  Pt is asking if this can be refill. He will establish with [facility] in Oct. Does he need a med fup appt with [name] so that he can have the above med refill     Phamacy:

## 2015-03-02 NOTE — Telephone Encounter (Signed)
Rx called in to pharmacy. 

## 2015-04-08 ENCOUNTER — Ambulatory Visit (INDEPENDENT_AMBULATORY_CARE_PROVIDER_SITE_OTHER): Payer: Medicare Other | Admitting: Adult Health

## 2015-04-08 ENCOUNTER — Encounter: Payer: Self-pay | Admitting: Adult Health

## 2015-04-08 VITALS — BP 144/74 | Temp 98.6°F | Ht 74.5 in | Wt 240.3 lb

## 2015-04-08 DIAGNOSIS — Z794 Long term (current) use of insulin: Secondary | ICD-10-CM | POA: Diagnosis not present

## 2015-04-08 DIAGNOSIS — Z7189 Other specified counseling: Secondary | ICD-10-CM | POA: Diagnosis not present

## 2015-04-08 DIAGNOSIS — I10 Essential (primary) hypertension: Secondary | ICD-10-CM

## 2015-04-08 DIAGNOSIS — Z7689 Persons encountering health services in other specified circumstances: Secondary | ICD-10-CM

## 2015-04-08 DIAGNOSIS — IMO0001 Reserved for inherently not codable concepts without codable children: Secondary | ICD-10-CM

## 2015-04-08 DIAGNOSIS — E1165 Type 2 diabetes mellitus with hyperglycemia: Secondary | ICD-10-CM | POA: Diagnosis not present

## 2015-04-08 LAB — BASIC METABOLIC PANEL
BUN: 27 mg/dL — AB (ref 6–23)
CHLORIDE: 103 meq/L (ref 96–112)
CO2: 27 mEq/L (ref 19–32)
CREATININE: 2.12 mg/dL — AB (ref 0.40–1.50)
Calcium: 9 mg/dL (ref 8.4–10.5)
GFR: 40.11 mL/min — ABNORMAL LOW (ref >60.00)
GLUCOSE: 270 mg/dL — AB (ref 70–99)
Potassium: 3.8 mEq/L (ref 3.5–5.1)
Sodium: 137 mEq/L (ref 135–145)

## 2015-04-08 LAB — HEMOGLOBIN A1C: Hgb A1c MFr Bld: 9.9 % — ABNORMAL HIGH (ref 4.6–6.5)

## 2015-04-08 NOTE — Patient Instructions (Signed)
It was great meeting you today!  I will follow up with you regarding your blood pressure.   Follow up with me for a complete physical. If you need anything in the meantime, please let me know.

## 2015-04-08 NOTE — Progress Notes (Signed)
Pre visit review using our clinic review tool, if applicable. No additional management support is needed unless otherwise documented below in the visit note. 

## 2015-04-08 NOTE — Progress Notes (Signed)
HPI:  [name] [name] is here to establish care.  Last PCP and physical: 10/2013  Has the following chronic problems that require follow up and concerns today:  Diabetes Type II - Feels as though his diabetes is well controlled. He monitors his blood sugars at home. They are trending below 200. His last A1c was 9.1 - this was over one year ago. Uses Novolog and Lantus.    HTN - Blood pressures have been in the 130's lately. Denies any dizziness or headaches.   Is going to schedule his diabetic eye exam    ROS negative for unless reported above: fevers, chills,feeling poorly, unintentional weight loss, hearing or vision loss, chest pain, palpitations, leg claudication, struggling to breath,Not feeling congested in the chest, no orthopenia, no cough,no wheezing, normal appetite, no soft tissue swelling, no hemoptysis, melena, hematochezia, hematuria, falls, loc, si, or thoughts of self harm.  Immunizations: Does not want vaccinations Diet:Work out at the gym 3 days a week Exercise: Tries to eat healthy. Likes to eat sweets.  Colonoscopy: Next is 2018 - 10 year plan  Followed by Nephrology - Is seen quarterly.  Past Medical History  Diagnosis Date  . DIABETES MELLITUS, TYPE II 01/02/2007  . HYPERLIPIDEMIA 06/18/2007  . HYPERTENSION 01/02/2007  . RENAL INSUFFICIENCY 02/05/2008    Past Surgical History  Procedure Laterality Date  . Shoulder surgery      left    Family History  Problem Relation Age of Onset  . Hypertension Mother   . Diabetes Mother   . Stroke Father     Social History   Social History  . Marital Status: Married    Spouse Name: N/A  . Number of Children: N/A  . Years of Education: N/A   Social History Main Topics  . Smoking status: Never Smoker   . Smokeless tobacco: Not on file  . Alcohol Use: No  . Drug Use: No  . Sexual Activity: Not on file   Other Topics Concern  . Not on file   Social History Narrative     Current outpatient  prescriptions:  .  amLODipine (NORVASC) 10 MG tablet, TAKE 1 TABLET BY MOUTH EVERY DAY, Disp: 90 tablet, Rfl: 0 .  B-D UF III MINI PEN NEEDLES 31G X 5 MM MISC, USE AS DIRECTED, Disp: 100 each, Rfl: 11 .  benazepril (LOTENSIN) 40 MG tablet, TAKE 1 TABLET BY MOUTH EVERY DAY, Disp: 90 tablet, Rfl: 1 .  calcitRIOL (ROCALTROL) 0.25 MCG capsule, Take 1 capsule by mouth daily., Disp: , Rfl:  .  furosemide (LASIX) 80 MG tablet, 160 mg po q a.m and 80 mg po q afternoon, Disp: 270 tablet, Rfl: 3 .  glucose blood (ONETOUCH VERIO) test strip, 1 each by Other route daily. Use as instructed, Disp: 100 each, Rfl: 11 .  hydrALAZINE (APRESOLINE) 25 MG tablet, TAKE 1 TABLET BY MOUTH THREE TIMES DAILY, Disp: 90 tablet, Rfl: 3 .  insulin aspart (NOVOLOG) 100 UNIT/ML injection, Take 6 units with every meal unless blood sugar above 200 than take 10 units, Disp: 3 vial, Rfl: 11 .  Insulin Glargine (LANTUS SOLOSTAR) 100 UNIT/ML Solostar Pen, INJECT 20 UNITS UNDER THE SKIN ONCE DAILY, Disp: 15 mL, Rfl: 1 .  labetalol (NORMODYNE) 200 MG tablet, Take 1 tablet (200 mg total) by mouth 2 (two) times daily., Disp: 180 tablet, Rfl: 0 .  potassium chloride SA (K-DUR,KLOR-CON) 20 MEQ tablet, Take 1 tablet by mouth daily., Disp: , Rfl:   EXAM:  Filed Vitals:   04/08/15 1000  BP: 144/74  Temp: 98.6 F (37 C)    Body mass index is 30.45 kg/(m^2).  GENERAL: vitals reviewed and listed above, alert, oriented, appears well hydrated and in no acute distress  HEENT: atraumatic, conjunttiva clear, no obvious abnormalities on inspection of external nose and ears. No cerumen impaction  NECK: Neck is soft and supple without masses, no adenopathy or thyromegaly, trachea midline, no JVD. Normal range of motion.   LUNGS: clear to auscultation bilaterally, no wheezes, rales or rhonchi, good air movement  CV: Regular rate and rhythm, normal S1/S2, no audible murmurs, gallops, or rubs. No carotid bruit and no peripheral edema.   MS:  moves all extremities without noticeable abnormality. No edema noted  Abd: soft/nontender/nondistended/normal bowel sounds   Skin: warm and dry, no rash   Extremities: No clubbing, cyanosis, or edema. Capillary refill is WNL. Pulses intact bilaterally in upper and lower extremities.   Neuro: CN II-XII intact, sensation and reflexes normal throughout, 5/5 muscle strength in bilateral upper and lower extremities. Normal finger to nose. Normal rapid alternating movements.  PSYCH: pleasant and cooperative, no obvious depression or anxiety  ASSESSMENT AND PLAN:  1. Encounter to establish care - Follow up in 1-2 months for CPE - Follow up sooner if needed  2. Uncontrolled type 2 diabetes mellitus without complication, with long-term current use of insulin (HCC) - Hemoglobin 123456 - Basic metabolic panel - Consider changing insulin dose or sending to Endocrin 3. Essential hypertension - Continue to monitor - Hemoglobin 123456 - Basic metabolic panel - Consider changing medications.   -We reviewed the PMH, PSH, FH, SH, Meds and Allergies. -We provided refills for any medications we will prescribe as needed. -We addressed current concerns per orders and patient instructions. -We have asked for records for pertinent exams, studies, vaccines and notes from previous providers. -We have advised patient to follow up per instructions below.   -Patient advised to return or notify a provider immediately if symptoms worsen or persist or new concerns arise.    Dorothyann Peng, AGNP

## 2015-04-27 ENCOUNTER — Ambulatory Visit (INDEPENDENT_AMBULATORY_CARE_PROVIDER_SITE_OTHER): Payer: Medicare Other | Admitting: Adult Health

## 2015-04-27 ENCOUNTER — Encounter: Payer: Self-pay | Admitting: Adult Health

## 2015-04-27 VITALS — BP 130/70 | Temp 98.8°F | Ht 74.5 in | Wt 243.0 lb

## 2015-04-27 DIAGNOSIS — Z794 Long term (current) use of insulin: Secondary | ICD-10-CM | POA: Diagnosis not present

## 2015-04-27 DIAGNOSIS — E1122 Type 2 diabetes mellitus with diabetic chronic kidney disease: Secondary | ICD-10-CM

## 2015-04-27 DIAGNOSIS — N183 Chronic kidney disease, stage 3 unspecified: Secondary | ICD-10-CM

## 2015-04-27 NOTE — Progress Notes (Signed)
Pre visit review using our clinic review tool, if applicable. No additional management support is needed unless otherwise documented below in the visit note. 

## 2015-04-27 NOTE — Patient Instructions (Signed)
It was great seeing you again today!  You really need to start watching your diet. Cut out sugars and carbs.   Go up on your lantus one unit per day until blood sugars are around 120-130.   Follow up in November for your physical, at which time we will see if your blood sugars are any better.   If you have any questions, please let me know

## 2015-04-27 NOTE — Progress Notes (Addendum)
Subjective:    Patient ID: [name] [name], male    DOB: 10/25/1945, 69 y.o.   MRN: [id number]  HPI  69 year old male who presents to the office today for two week follow up regarding uncontrolled DM II. During his last visit on 04/08/2015 his A1c was  Lab Results  Component Value Date   HGBA1C 9.9* 04/08/2015   This is up from 9.1 a year ago.   Today in the office his blood sugar was 243. His blood sugars were 198 on Sunday (fasting). He endorses that he " just doesn't eating right." He has not been working out and has been eating horribly. Over the weekend his diet consisted of sweet tea, hot dogs and birthday cake. He knows that he needs to work on diet.   He has seen endocrinology in the past and did not like seeing them. He has also tried other oral agents in the past. Does not want to go on any additional medications at this point.   He promises that he will work on diet and exercise before his physical. At that time, if we need too he will go on oral agents.   I had advised him during the last visit to go up on his Lantus 1 unit every day until his blood sugars were around 120-130. He went up one unit total    Review of Systems  Constitutional: Negative.   HENT: Negative.   Eyes: Negative.   Respiratory: Negative.   Cardiovascular: Negative.   Gastrointestinal: Negative.   Endocrine: Negative.   Genitourinary: Negative.   Musculoskeletal: Negative.   Skin: Negative.   Allergic/Immunologic: Negative.   Neurological: Negative.   Hematological: Negative.   Psychiatric/Behavioral: Negative.   All other systems reviewed and are negative.  Past Medical History  Diagnosis Date  . DIABETES MELLITUS, TYPE II 01/02/2007  . HYPERLIPIDEMIA 06/18/2007  . HYPERTENSION 01/02/2007  . RENAL INSUFFICIENCY 02/05/2008    Social History   Social History  . Marital Status: Married    Spouse Name: N/A  . Number of Children: N/A  . Years of Education: N/A   Occupational History  .  Not on file.   Social History Main Topics  . Smoking status: Never Smoker   . Smokeless tobacco: Not on file  . Alcohol Use: No  . Drug Use: No  . Sexual Activity: Not on file   Other Topics Concern  . 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 Likes to go to football games.        Past Surgical History  Procedure Laterality Date  . Shoulder surgery      left    Family History  Problem Relation Age of Onset  . Hypertension Mother   . Diabetes Mother   . Stroke Father   . Stroke Maternal Grandmother     Allergies  Allergen Reactions  . Bactrim [Sulfamethoxazole-Trimethoprim]   . Penicillins     REACTION: urticaria (hives)  . Statins     myalgia  . Sulfa Drugs Cross Reactors Hives, Itching and Swelling    Current Outpatient Prescriptions on File Prior to Visit  Medication Sig Dispense Refill  . amLODipine (NORVASC) 10 MG tablet TAKE 1 TABLET BY MOUTH EVERY DAY 90 tablet 0  .  B-D UF III MINI PEN NEEDLES 31G X 5 MM MISC USE AS DIRECTED 100 each 11  . benazepril (LOTENSIN) 40 MG tablet TAKE 1 TABLET BY MOUTH EVERY DAY 90 tablet 1  . calcitRIOL (ROCALTROL) 0.25 MCG capsule Take 1 capsule by mouth daily.    . furosemide (LASIX) 80 MG tablet 160 mg po q a.m and 80 mg po q afternoon 270 tablet 3  . glucose blood (ONETOUCH VERIO) test strip 1 each by Other route daily. Use as instructed 100 each 11  . hydrALAZINE (APRESOLINE) 25 MG tablet TAKE 1 TABLET BY MOUTH THREE TIMES DAILY 90 tablet 3  . insulin aspart (NOVOLOG) 100 UNIT/ML injection Take 6 units with every meal unless blood sugar above 200 than take 10 units 3 vial 11  . Insulin Glargine (LANTUS SOLOSTAR) 100 UNIT/ML Solostar Pen INJECT 20 UNITS UNDER THE SKIN ONCE DAILY 15 mL 1  . labetalol (NORMODYNE) 200 MG tablet Take 1 tablet (200 mg total) by mouth 2 (two) times  daily. 180 tablet 0  . potassium chloride SA (K-DUR,KLOR-CON) 20 MEQ tablet Take 1 tablet by mouth daily.     No current facility-administered medications on file prior to visit.    BP 130/70 mmHg  Temp(Src) 98.8 F (37.1 C) (Oral)  Ht 6' 2.5" (1.892 m)  Wt 243 lb (110.224 kg)  BMI 30.79 kg/m2       Objective:   Physical Exam  Constitutional: He is oriented to person, place, and time. He appears well-developed and well-nourished. No distress.  Cardiovascular: Normal rate, regular rhythm, normal heart sounds and intact distal pulses.  Exam reveals no gallop and no friction rub.   No murmur heard. Pulmonary/Chest: Effort normal and breath sounds normal. No respiratory distress. He has no wheezes. He has no rales. He exhibits no tenderness.  Neurological: He is alert and oriented to person, place, and time.  Skin: Skin is warm and dry. No rash noted. He is not diaphoretic. No erythema. No pallor.  Psychiatric: He has a normal mood and affect. His behavior is normal.  Nursing note and vitals reviewed.     Assessment & Plan:  1. Type 2 diabetes mellitus with stage 3 chronic kidney disease, with long-term current use of insulin ([name]) - Stressed the importance of diet and exercise.  -Go up on Lantus 1 unit per day - Consider oral agents - He may need to go back to endocrinology.  - Refused diabetic education consult

## 2015-05-04 ENCOUNTER — Other Ambulatory Visit: Payer: Self-pay | Admitting: Adult Health

## 2015-05-04 NOTE — Telephone Encounter (Signed)
Any medication changes?

## 2015-05-04 NOTE — Telephone Encounter (Signed)
Ok to refill 

## 2015-06-02 ENCOUNTER — Other Ambulatory Visit: Payer: Self-pay | Admitting: Adult Health

## 2015-06-02 ENCOUNTER — Encounter: Payer: Self-pay | Admitting: Adult Health

## 2015-06-02 ENCOUNTER — Ambulatory Visit (INDEPENDENT_AMBULATORY_CARE_PROVIDER_SITE_OTHER): Payer: Medicare Other | Admitting: Adult Health

## 2015-06-02 VITALS — BP 120/84 | Temp 98.4°F | Ht 74.5 in | Wt 242.3 lb

## 2015-06-02 DIAGNOSIS — Z125 Encounter for screening for malignant neoplasm of prostate: Secondary | ICD-10-CM | POA: Diagnosis not present

## 2015-06-02 DIAGNOSIS — E1122 Type 2 diabetes mellitus with diabetic chronic kidney disease: Secondary | ICD-10-CM

## 2015-06-02 DIAGNOSIS — Z0000 Encounter for general adult medical examination without abnormal findings: Secondary | ICD-10-CM | POA: Diagnosis not present

## 2015-06-02 DIAGNOSIS — N183 Chronic kidney disease, stage 3 (moderate): Secondary | ICD-10-CM | POA: Diagnosis not present

## 2015-06-02 DIAGNOSIS — Z794 Long term (current) use of insulin: Secondary | ICD-10-CM

## 2015-06-02 DIAGNOSIS — E785 Hyperlipidemia, unspecified: Secondary | ICD-10-CM | POA: Diagnosis not present

## 2015-06-02 LAB — BASIC METABOLIC PANEL
BUN: 26 mg/dL — ABNORMAL HIGH (ref 6–23)
CALCIUM: 9.3 mg/dL (ref 8.4–10.5)
CHLORIDE: 106 meq/L (ref 96–112)
CO2: 27 mEq/L (ref 19–32)
CREATININE: 2.15 mg/dL — AB (ref 0.40–1.50)
GFR: 39.45 mL/min — ABNORMAL LOW (ref >60.00)
Glucose, Bld: 183 mg/dL — ABNORMAL HIGH (ref 70–99)
Potassium: 3.9 mEq/L (ref 3.5–5.1)
Sodium: 141 mEq/L (ref 135–145)

## 2015-06-02 LAB — POCT URINALYSIS DIPSTICK
BILIRUBIN UA: NEGATIVE
Glucose, UA: NEGATIVE
Ketones, UA: NEGATIVE
Leukocytes, UA: NEGATIVE
NITRITE UA: NEGATIVE
PH UA: 5.5
Protein, UA: NEGATIVE
RBC UA: NEGATIVE
Spec Grav, UA: 1.015
Urobilinogen, UA: 0.2

## 2015-06-02 LAB — CBC WITH DIFFERENTIAL/PLATELET
Basophils Absolute: 0.1 10*3/uL (ref 0.0–0.1)
Basophils Relative: 0.6 % (ref 0.0–3.0)
EOS PCT: 5.7 % — AB (ref 0.0–5.0)
Eosinophils Absolute: 0.5 10*3/uL (ref 0.0–0.7)
HCT: 38.4 % — ABNORMAL LOW (ref 39.0–52.0)
Hemoglobin: 12.8 g/dL — ABNORMAL LOW (ref 13.0–17.0)
LYMPHS ABS: 2.2 10*3/uL (ref 0.7–4.0)
Lymphocytes Relative: 27.3 % (ref 12.0–46.0)
MCHC: 33.3 g/dL (ref 30.0–36.0)
MCV: 80.5 fl (ref 78.0–100.0)
MONO ABS: 0.5 10*3/uL (ref 0.1–1.0)
Monocytes Relative: 6.3 % (ref 3.0–12.0)
NEUTROS PCT: 60.1 % (ref 43.0–77.0)
Neutro Abs: 4.9 10*3/uL (ref 1.4–7.7)
Platelets: 280 10*3/uL (ref 150.0–400.0)
RBC: 4.77 Mil/uL (ref 4.22–5.81)
RDW: 14.7 % (ref 11.5–15.5)
WBC: 8.2 10*3/uL (ref 4.0–10.5)

## 2015-06-02 LAB — HEPATIC FUNCTION PANEL
ALT: 14 U/L (ref 0–53)
AST: 15 U/L (ref 0–37)
Albumin: 4.2 g/dL (ref 3.5–5.2)
Alkaline Phosphatase: 77 U/L (ref 39–117)
BILIRUBIN TOTAL: 0.6 mg/dL (ref 0.2–1.2)
Bilirubin, Direct: 0.1 mg/dL (ref 0.0–0.3)
Total Protein: 7 g/dL (ref 6.0–8.3)

## 2015-06-02 LAB — LIPID PANEL
CHOLESTEROL: 276 mg/dL — AB (ref 0–200)
HDL: 37.5 mg/dL — AB (ref >39.00)
LDL CALC: 199 mg/dL — AB (ref 0–99)
NonHDL: 238.5
Total CHOL/HDL Ratio: 7
Triglycerides: 199 mg/dL — ABNORMAL HIGH (ref 0.0–149.0)
VLDL: 39.8 mg/dL (ref 0.0–40.0)

## 2015-06-02 LAB — HEMOGLOBIN A1C: HEMOGLOBIN A1C: 9.4 % — AB (ref 4.6–6.5)

## 2015-06-02 LAB — TSH: TSH: 2.84 u[IU]/mL (ref 0.35–4.50)

## 2015-06-02 LAB — PSA: PSA: 0.72 ng/mL (ref 0.10–4.00)

## 2015-06-02 NOTE — Progress Notes (Signed)
Subjective:  Patient presents for yearly preventative medicine examination. Medicare questionnaire was completed  All immunizations and health maintenance protocols were reviewed with the patient and needed orders were placed.  Appropriate screening laboratory values were ordered for the patient including screening of hyperlipidemia, renal function and hepatic function. If indicated by BPH, a PSA was ordered.  Medication reconciliation,  past medical history, social history, problem list and allergies were reviewed in detail with the patient  Goals were established with regard to weight loss, exercise, and  diet in compliance with medications  End of life planning was discussed- Has a living will and advanced directives.   He is currently taking Lantus 22 units at night. He is checking his blood sugars once a day in the morning. He endorses that they are still in the 190's.   Preventive Screening-Counseling & Management  Smoking Status: Never Smoker Second Hand Smoking status: No smokers in home  Risk Factors Regular exercise: Does cardio and weight lifting three times a week  Diet: Does not follow a diabetic diet Fall Risk: None   Cardiac risk factors:  advanced age (older than 15 for men, 87 for women)  Hyperlipidemia  Diabetes.  Family History: Mother with HTN and diabetes. Father with stroke  Depression Screen None. PHQ2 0   Activities of Daily Living Independent ADLs and IADLs   Hearing Difficulties: patient declines  Cognitive Testing No reported trouble.   Normal 3 word recall  List the Names of Other Physician/Practitioners you currently use: 1.Nephrology - Dr. Faustino Congress 2. Optho - [facility]. He has an appointment for his eye exam  Immunization History  Administered Date(s) Administered  . Pneumococcal Polysaccharide-23 12/20/2011  . Td 11/13/2009   Required Immunizations needed today: None  Screening tests- up to date Health  Maintenance Due  Topic Date Due  . Hepatitis C Screening  July 04, 1946    ROS- No pertinent positives discovered in course of AWV  The following were reviewed and entered/updated in epic: Past Medical History  Diagnosis Date  . DIABETES MELLITUS, TYPE II 01/02/2007  . HYPERLIPIDEMIA 06/18/2007  . HYPERTENSION 01/02/2007  . RENAL INSUFFICIENCY 02/05/2008   Patient Active Problem List   Diagnosis Date Noted  . Gout 07/21/2011  . Prostatitis, chronic 07/14/2010  . RENAL INSUFFICIENCY 02/05/2008  . HYPERLIPIDEMIA 06/18/2007  . DM type 2 (diabetes mellitus, type 2) ([place]) 01/02/2007  . Hypertensive kidney disease 01/02/2007   Past Surgical History  Procedure Laterality Date  . Shoulder surgery      left    Family History  Problem Relation Age of Onset  . Hypertension Mother   . Diabetes Mother   . Stroke Father   . Stroke Maternal Grandmother     Medications- reviewed and updated Current Outpatient Prescriptions  Medication Sig Dispense Refill  . amLODipine (NORVASC) 10 MG tablet TAKE 1 TABLET BY MOUTH EVERY DAY 90 tablet 0  . B-D UF III MINI PEN NEEDLES 31G X 5 MM MISC USE AS DIRECTED 100 each 11  . benazepril (LOTENSIN) 40 MG tablet TAKE 1 TABLET BY MOUTH TWICE DAILY 60 tablet 5  . calcitRIOL (ROCALTROL) 0.25 MCG capsule Take 1 capsule by mouth daily.    . furosemide (LASIX) 80 MG tablet 160 mg po q a.m and 80 mg po q afternoon 270 tablet 3  . glucose blood (ONETOUCH VERIO) test strip 1 each by Other route daily. Use as instructed 100 each 11  . hydrALAZINE (APRESOLINE) 25 MG tablet TAKE 1 TABLET BY  MOUTH THREE TIMES DAILY 90 tablet 3  . insulin aspart (NOVOLOG) 100 UNIT/ML injection Take 6 units with every meal unless blood sugar above 200 than take 10 units 3 vial 11  . Insulin Glargine (LANTUS SOLOSTAR) 100 UNIT/ML Solostar Pen INJECT 20 UNITS UNDER THE SKIN ONCE DAILY 15 mL 1  . labetalol (NORMODYNE) 200 MG tablet Take 1 tablet (200 mg total) by mouth 2 (two) times daily.  180 tablet 0  . potassium chloride SA (K-DUR,KLOR-CON) 20 MEQ tablet Take 1 tablet by mouth daily.     No current facility-administered medications for this visit.    Allergies-reviewed and updated Allergies  Allergen Reactions  . Bactrim [Sulfamethoxazole-Trimethoprim]   . Penicillins     REACTION: urticaria (hives)  . Statins     myalgia  . Sulfa Drugs Cross Reactors Hives, Itching and Swelling    Social History   Social History  . Marital Status: Married    Spouse Name: N/A  . Number of Children: N/A  . Years of Education: N/A   Social History Main Topics  . Smoking status: Never Smoker   . Smokeless tobacco: None  . Alcohol Use: No  . Drug Use: No  . Sexual Activity: Not Asked   Other Topics Concern  . 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 Likes to go to football games.        Objective: BP 120/84 mmHg  Temp(Src) 98.4 F (36.9 C) (Oral)  Ht 6' 2.5" (1.892 m)  Wt 242 lb 4.8 oz (109.907 kg)  BMI 30.70 kg/m2 GENERAL: vitals reviewed and listed above, alert, oriented, appears well hydrated and in no acute distress  HEENT: atraumatic, conjunttiva clear, no obvious abnormalities on inspection of external nose and ears. No cerumen impaction  NECK: Neck is soft and supple without masses, no adenopathy or thyromegaly, trachea midline, no JVD. Normal range of motion.   LUNGS: clear to auscultation bilaterally, no wheezes, rales or rhonchi, good air movement  CV: Regular rate and rhythm, normal S1/S2, no audible murmurs, gallops, or rubs. No carotid bruit and no peripheral edema.   MS: moves all extremities without noticeable abnormality. No edema noted  Abd: soft/nontender/nondistended/normal bowel sounds  Rectal: Normal tone. Prostate smooth and symmetrical.  Guaiac negative  Skin: warm and dry, no rash    Extremities: No clubbing, cyanosis, or edema. Capillary refill is WNL. Pulses intact bilaterally in upper and lower extremities.   Neuro: CN II-XII intact, sensation and reflexes normal throughout, 5/5 muscle strength in bilateral upper and lower extremities. Normal finger to nose. Normal rapid alternating movements.  PSYCH: pleasant and cooperative, no obvious depression or anxiety  Assessment/Plan:  1. Type 2 diabetes mellitus with stage 3 chronic kidney disease, with long-term current use of insulin ([facility]) - CBC with Differential/Platelet - PSA - Basic metabolic panel - Hemoglobin A1c - Hepatic function panel - Lipid panel - POCT urinalysis dipstick - TSH - EKG 12-Lead- NSR, rate 62 - He has an understanding that he needs to follow a diabetic diet.  - Will place him on oral agent depending on what A1c is - Follow up in 3 months.   2. Hyperlipidemia - CBC with Differential/Platelet - PSA - Basic metabolic panel - Hemoglobin A1c - Hepatic function panel - Lipid panel - POCT  urinalysis dipstick - TSH - EKG 12-Lead  3. Routine general medical examination at a health care facility - CBC with Differential/Platelet - PSA - Basic metabolic panel - Hemoglobin A1c - Hepatic function panel - Lipid panel - POCT urinalysis dipstick - TSH - EKG 12-Lead - Follow up in one year for CPE - Follow up sooner if needed - Continue to exercise and follow a diabetic diet   Return precautions advised.

## 2015-06-02 NOTE — Patient Instructions (Signed)
It was great seeing you again!  I will follow up with you regarding your blood work and then we will decide on what medication we need to start you on.   Continue to monitor blood sugars.   Continue to go up on Lantus - 1 unit per day until blood sugars are 120-130. Let me know if you get to 45 units per night.   It is important that you work on diet, we really need to get your diabetes under control.   Follow up in 3 months.

## 2015-06-02 NOTE — Progress Notes (Signed)
Pre visit review using our clinic review tool, if applicable. No additional management support is needed unless otherwise documented below in the visit note. 

## 2015-06-03 ENCOUNTER — Telehealth: Payer: Self-pay | Admitting: Adult Health

## 2015-06-03 ENCOUNTER — Other Ambulatory Visit: Payer: Self-pay | Admitting: Adult Health

## 2015-06-03 MED ORDER — FLUVASTATIN SODIUM 40 MG PO CAPS: 40.0000 mg | ORAL_CAPSULE | Freq: Every day | ORAL | Status: DC

## 2015-06-03 MED ORDER — NATEGLINIDE 120 MG PO TABS: 120.0000 mg | ORAL_TABLET | Freq: Three times a day (TID) | ORAL | Status: DC

## 2015-06-03 NOTE — Telephone Encounter (Signed)
Spoke with [name] on the phone and informed him of his A1c as well as Lipid profile results. Due to 10 year cardiac risk of 33%, I will start him on statin medication again. He has taken Lipitor in the past and has myalgia. Will trial Lescol at 40mg . Will also start on Starlix 120mg  for diabetes. He understands how important it is for him to change his diet and exercise. Follow up in 3 months for recheck, or sooner if needed

## 2015-08-14 ENCOUNTER — Other Ambulatory Visit: Payer: Self-pay | Admitting: Adult Health

## 2015-09-25 ENCOUNTER — Other Ambulatory Visit: Payer: Self-pay | Admitting: Adult Health

## 2015-11-09 ENCOUNTER — Other Ambulatory Visit: Payer: Self-pay | Admitting: Adult Health

## 2015-12-04 ENCOUNTER — Other Ambulatory Visit: Payer: Self-pay | Admitting: Adult Health

## 2015-12-04 NOTE — Telephone Encounter (Signed)
Ok to refill 

## 2015-12-04 NOTE — Telephone Encounter (Signed)
Ok to refill for 6 months 

## 2016-02-09 ENCOUNTER — Other Ambulatory Visit: Payer: Self-pay | Admitting: Adult Health

## 2016-02-09 NOTE — Telephone Encounter (Signed)
Pt last seen 05/20/15  Didn't follow up in 3 months as instructed   Refill or appointment needed?

## 2016-02-09 NOTE — Telephone Encounter (Signed)
Ok to refill. I would like him to come in to have his A1c checked

## 2016-04-19 ENCOUNTER — Other Ambulatory Visit: Payer: Self-pay | Admitting: Adult Health

## 2016-04-19 NOTE — Telephone Encounter (Signed)
Ok to refill 

## 2016-04-20 ENCOUNTER — Telehealth: Payer: Self-pay | Admitting: Adult Health

## 2016-04-20 ENCOUNTER — Other Ambulatory Visit: Payer: Self-pay

## 2016-04-20 MED ORDER — INSULIN PEN NEEDLE 31G X 5 MM MISC: 11 refills | Status: DC

## 2016-04-20 NOTE — Telephone Encounter (Signed)
Rx has been sent in. 

## 2016-04-20 NOTE — Telephone Encounter (Signed)
Pt needs refill on bd ultra fine pen needles 31g x 81mm 1 box send to [facility]

## 2016-05-16 ENCOUNTER — Other Ambulatory Visit: Payer: Self-pay | Admitting: Adult Health

## 2016-05-16 NOTE — Telephone Encounter (Signed)
OK to refill

## 2016-05-16 NOTE — Telephone Encounter (Signed)
Ok to renew for 90 days. He will need an office visit for further refills

## 2016-06-14 ENCOUNTER — Other Ambulatory Visit: Payer: Self-pay | Admitting: Adult Health

## 2016-06-29 ENCOUNTER — Other Ambulatory Visit: Payer: Self-pay | Admitting: Adult Health

## 2016-06-29 NOTE — Telephone Encounter (Signed)
Patient needs to make an appointment for further refills

## 2016-07-15 ENCOUNTER — Other Ambulatory Visit: Payer: Self-pay | Admitting: Adult Health

## 2016-07-15 NOTE — Telephone Encounter (Signed)
Ok to refill? Patient last seen 06/02/15

## 2016-07-15 NOTE — Telephone Encounter (Signed)
He can have 30 days but he needs to come in for an appointment

## 2016-07-18 ENCOUNTER — Encounter: Payer: Self-pay | Admitting: Gastroenterology

## 2016-08-16 ENCOUNTER — Ambulatory Visit (INDEPENDENT_AMBULATORY_CARE_PROVIDER_SITE_OTHER): Payer: Medicare Other | Admitting: Adult Health

## 2016-08-16 ENCOUNTER — Other Ambulatory Visit: Payer: Self-pay | Admitting: Adult Health

## 2016-08-16 ENCOUNTER — Ambulatory Visit: Payer: Medicare Other | Admitting: Family Medicine

## 2016-08-16 VITALS — BP 154/82 | Temp 98.5°F | Ht 74.5 in | Wt 244.8 lb

## 2016-08-16 DIAGNOSIS — N183 Chronic kidney disease, stage 3 unspecified: Secondary | ICD-10-CM

## 2016-08-16 DIAGNOSIS — Z794 Long term (current) use of insulin: Secondary | ICD-10-CM

## 2016-08-16 DIAGNOSIS — E1122 Type 2 diabetes mellitus with diabetic chronic kidney disease: Secondary | ICD-10-CM | POA: Diagnosis not present

## 2016-08-16 DIAGNOSIS — M5431 Sciatica, right side: Secondary | ICD-10-CM | POA: Diagnosis not present

## 2016-08-16 DIAGNOSIS — I129 Hypertensive chronic kidney disease with stage 1 through stage 4 chronic kidney disease, or unspecified chronic kidney disease: Secondary | ICD-10-CM | POA: Diagnosis not present

## 2016-08-16 LAB — POCT GLYCOSYLATED HEMOGLOBIN (HGB A1C): Hemoglobin A1C: 11

## 2016-08-16 MED ORDER — AMLODIPINE BESYLATE 10 MG PO TABS: 10.0000 mg | ORAL_TABLET | Freq: Every day | ORAL | 3 refills | Status: DC

## 2016-08-16 MED ORDER — CYCLOBENZAPRINE HCL 10 MG PO TABS: 10.0000 mg | ORAL_TABLET | Freq: Three times a day (TID) | ORAL | 0 refills | Status: DC | PRN

## 2016-08-16 NOTE — Telephone Encounter (Signed)
Okay to refill for 6 months 

## 2016-08-16 NOTE — Progress Notes (Signed)
Subjective:    Patient ID: [name] [name], male    DOB: 1946-03-08, 71 y.o.   MRN: [id number]  HPI   71 year old male who  has a past medical history of DIABETES MELLITUS, TYPE II (01/02/2007); HYPERLIPIDEMIA (06/18/2007); HYPERTENSION (01/02/2007); and RENAL INSUFFICIENCY (02/05/2008). He presents to the office today with multiple complaints. I last saw him over 1 year ago  Essential hypertension - has chronic kidney disease which is followed by nephrology. He was last seen by nephrology proximally one week ago at that time his blood pressure was in the 009F systolic, patient reports that they increased his labetalol dose to 3 times a day. In the office today his blood pressure continues to run slightly elevated at 154/82. He denies any headaches or blurred vision  Diabetes mellitus type 2- it sounds like nephrology increase Lantus to 22 units at bedtime. He continues to take 6 units of NovoLog 3 times a day as well as Starlix 120 mg. Reports that he is no longer been exercising and not following a healthy diet. Blood sugars when he checks them are in the 200s on a regular basis. His A1c in November 2016, which is the last one we have was 9.4.  Right sided leg pain - over the last week he's developed a burning sensation that starts in his right lower back and travels down the outside have his right leg to his knee. Pain is worse when laying in bed as well as with movement. Denies any trauma, bruising, or numbness and tingling in right lower extremity   Review of Systems  Constitutional: Positive for activity change.  HENT: Negative.   Respiratory: Negative.   Cardiovascular: Negative.   Musculoskeletal: Positive for arthralgias, gait problem, joint swelling and myalgias.  Skin: Negative.   All other systems reviewed and are negative.  Past Medical History:  Diagnosis Date  . DIABETES MELLITUS, TYPE II 01/02/2007  . HYPERLIPIDEMIA 06/18/2007  . HYPERTENSION 01/02/2007  . RENAL INSUFFICIENCY  02/05/2008    Social History   Social History  . Marital status: Married    Spouse name: N/A  . Number of children: N/A  . Years of education: N/A   Occupational History  . Not on file.   Social History Main Topics  . Smoking status: Never Smoker  . Smokeless tobacco: Not on file  . Alcohol use No  . Drug use: No  . Sexual activity: Not on file   Other Topics Concern  . 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 Likes to go to football games.        Past Surgical History:  Procedure Laterality Date  . SHOULDER SURGERY     left    Family History  Problem Relation Age of Onset  . Hypertension Mother   . Diabetes Mother   . Stroke Father   . Stroke Maternal Grandmother     Allergies  Allergen Reactions  . Bactrim [Sulfamethoxazole-Trimethoprim]   . Penicillins     REACTION: urticaria (hives)  . Statins     myalgia  . Sulfa Drugs Cross Reactors Hives, Itching and Swelling    Current Outpatient Prescriptions on File Prior to Visit  Medication Sig Dispense Refill  . amLODipine (NORVASC) 10 MG tablet TAKE 1 TABLET BY MOUTH  EVERY DAY 90 tablet 0  . benazepril (LOTENSIN) 40 MG tablet TAKE 1 TABLET BY MOUTH TWICE DAILY 60 tablet 0  . calcitRIOL (ROCALTROL) 0.25 MCG capsule Take 1 capsule by mouth daily.    . fluvastatin (LESCOL) 40 MG capsule Take 1 capsule (40 mg total) by mouth at bedtime. 30 capsule 11  . furosemide (LASIX) 80 MG tablet 160 mg po q a.m and 80 mg po q afternoon 270 tablet 3  . glucose blood (ONETOUCH VERIO) test strip 1 each by Other route daily. Use as instructed 100 each 11  . hydrALAZINE (APRESOLINE) 25 MG tablet TAKE 1 TABLET BY MOUTH THREE TIMES DAILY 60 tablet 1  . insulin aspart (NOVOLOG) 100 UNIT/ML injection Take 6 units with every meal unless blood sugar above 200 than take 10 units 3  vial 11  . Insulin Pen Needle (B-D UF III MINI PEN NEEDLES) 31G X 5 MM MISC USE AS DIRECTED 100 each 11  . labetalol (NORMODYNE) 200 MG tablet Take 1 tablet (200 mg total) by mouth 2 (two) times daily. 180 tablet 0  . LANTUS SOLOSTAR 100 UNIT/ML Solostar Pen INJECT 20 UNITS SUBCUTANEOUSLY ONCE DAILY. NEED TO MAKE APPOINTMENT. 15 mL 1  . nateglinide (STARLIX) 120 MG tablet Take 1 tablet (120 mg total) by mouth 3 (three) times daily with meals. 90 tablet 6  . potassium chloride SA (K-DUR,KLOR-CON) 20 MEQ tablet Take 1 tablet by mouth daily.     No current facility-administered medications on file prior to visit.     BP (!) 154/82   Temp 98.5 F (36.9 C) (Oral)   Ht 6' 2.5" (1.892 m)   Wt 244 lb 12.8 oz (111 kg)   BMI 31.01 kg/m       Objective:   Physical Exam  Constitutional: He is oriented to person, place, and time. He appears well-developed and well-nourished. No distress.  Cardiovascular: Normal rate, regular rhythm, normal heart sounds and intact distal pulses.  Exam reveals no gallop and no friction rub.   No murmur heard. Pulmonary/Chest: Effort normal and breath sounds normal. No respiratory distress. He has no wheezes. He has no rales. He exhibits no tenderness.  Musculoskeletal: Normal range of motion. He exhibits no edema, tenderness or deformity.  Neurological: He is alert and oriented to person, place, and time.  Skin: Skin is warm and dry. No rash noted. He is not diaphoretic. No erythema. No pallor.  Psychiatric: He has a normal mood and affect. His behavior is normal. Judgment and thought content normal.  Nursing note and vitals reviewed.     Assessment & Plan:  1. Type 2 diabetes mellitus with stage 3 chronic kidney disease, with long-term current use of insulin ([facility]) - A1c has increased from 9.4 to 11.0. Will increase his NovoLog from 6 units to 10 units 3 times a day. Keep Lantus at 22 units in - POC HgB A1c - 11.0 - Patient was informed that he bears partial  responsibility as he needs to start exercising and eating healthier. He also needs to follow-up on a regular basis not just yearly. - I'll up in 3 months for repeat A1c  2. Hypertensive kidney disease - No change in blood pressure medication at this time - Follow-up with nephrology as directed  3. Sciatica of right side - Advised to start Flexeril 10 mg daily at bedtime as well as take Tylenol as needed. Stretching exercises will be beneficial for this patient  Dorothyann Peng, NP

## 2016-08-16 NOTE — Patient Instructions (Addendum)
Your A1c was 11.   Increase Novolog to 10 units daily.   Lantus 22 units daily  Work on diet and exercise   Take muscle relaxer at night as it will make you sleepy. Take this with tylenol

## 2016-09-01 ENCOUNTER — Other Ambulatory Visit: Payer: Self-pay | Admitting: Adult Health

## 2016-09-01 NOTE — Telephone Encounter (Signed)
Ok to refill 

## 2016-10-03 ENCOUNTER — Other Ambulatory Visit: Payer: Self-pay | Admitting: Adult Health

## 2016-11-02 ENCOUNTER — Other Ambulatory Visit: Payer: Self-pay | Admitting: Adult Health

## 2016-11-02 NOTE — Telephone Encounter (Signed)
Ok to refill for one year  

## 2016-11-08 ENCOUNTER — Ambulatory Visit (INDEPENDENT_AMBULATORY_CARE_PROVIDER_SITE_OTHER): Payer: Medicare Other | Admitting: Adult Health

## 2016-11-08 ENCOUNTER — Encounter: Payer: Self-pay | Admitting: Adult Health

## 2016-11-08 VITALS — BP 154/70 | Temp 98.2°F | Ht 74.5 in | Wt 242.5 lb

## 2016-11-08 DIAGNOSIS — N183 Chronic kidney disease, stage 3 (moderate): Secondary | ICD-10-CM

## 2016-11-08 DIAGNOSIS — E1122 Type 2 diabetes mellitus with diabetic chronic kidney disease: Secondary | ICD-10-CM

## 2016-11-08 DIAGNOSIS — Z794 Long term (current) use of insulin: Secondary | ICD-10-CM | POA: Diagnosis not present

## 2016-11-08 LAB — POCT GLYCOSYLATED HEMOGLOBIN (HGB A1C): Hemoglobin A1C: 9.1

## 2016-11-08 NOTE — Progress Notes (Signed)
Subjective:    Patient ID: [name] [name], male    DOB: December 29, 1945, 71 y.o.   MRN: [id number]  HPI  71 year old male who  has a past medical history of DIABETES MELLITUS, TYPE II (01/02/2007); HYPERLIPIDEMIA (06/18/2007); HYPERTENSION (01/02/2007); and RENAL INSUFFICIENCY (02/05/2008). He presents to the office today for three month follow up regarding diabetes mellitus. His last A1c in February 2018 was 11.0. During the last visit Nephrology had increased Lantus to 22 units, and Novolog was increased to 10 units daily. He is also taking Starlix 120 mg  He reports that he has changed his diet to not include as much sweets and is working on portion control. He reports that his home blood sugar readings are " around 140's fasting. "  Lab Results  Component Value Date   HGBA1C 11.0 08/16/2016    He denies any episodes of hypoglycemia   Review of Systems See HPI   Past Medical History:  Diagnosis Date  . DIABETES MELLITUS, TYPE II 01/02/2007  . HYPERLIPIDEMIA 06/18/2007  . HYPERTENSION 01/02/2007  . RENAL INSUFFICIENCY 02/05/2008    Social History   Social History  . Marital status: Married    Spouse name: N/A  . Number of children: N/A  . Years of education: N/A   Occupational History  . Not on file.   Social History Main Topics  . Smoking status: Never Smoker  . Smokeless tobacco: Never Used  . Alcohol use No  . Drug use: No  . Sexual activity: Not on file   Other Topics Concern  . 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 Likes to go to football games.        Past Surgical History:  Procedure Laterality Date  . SHOULDER SURGERY     left    Family History  Problem Relation Age of Onset  . Hypertension Mother   . Diabetes Mother   . Stroke Father   . Stroke Maternal Grandmother     Allergies  Allergen  Reactions  . Bactrim [Sulfamethoxazole-Trimethoprim]   . Penicillins     REACTION: urticaria (hives)  . Statins     myalgia  . Sulfa Drugs Cross Reactors Hives, Itching and Swelling    Current Outpatient Prescriptions on File Prior to Visit  Medication Sig Dispense Refill  . amLODipine (NORVASC) 10 MG tablet Take 1 tablet (10 mg total) by mouth daily. 90 tablet 3  . benazepril (LOTENSIN) 40 MG tablet TAKE 1 TABLET BY MOUTH TWICE DAILY 180 tablet 1  . calcitRIOL (ROCALTROL) 0.25 MCG capsule Take 1 capsule by mouth daily.    . cyclobenzaprine (FLEXERIL) 10 MG tablet Take 1 tablet (10 mg total) by mouth 3 (three) times daily as needed for muscle spasms. 30 tablet 0  . fluvastatin (LESCOL) 40 MG capsule Take 1 capsule (40 mg total) by mouth at bedtime. 30 capsule 11  . furosemide (LASIX) 80 MG tablet 160 mg po q a.m and 80 mg po q afternoon 270 tablet 3  . glucose blood (ONETOUCH VERIO) test strip 1 each by Other route daily. Use as instructed 100 each 11  . hydrALAZINE (APRESOLINE) 25 MG tablet TAKE 1 TABLET BY MOUTH THREE TIMES DAILY 270 tablet 3  . insulin aspart (NOVOLOG) 100 UNIT/ML injection  Take 6 units with every meal unless blood sugar above 200 than take 10 units (Patient taking differently: Inject 10 Units into the skin 3 (three) times daily with meals. Take 6 units with every meal unless blood sugar above 200 than take 10 units) 3 vial 11  . Insulin Pen Needle (B-D UF III MINI PEN NEEDLES) 31G X 5 MM MISC USE AS DIRECTED 100 each 11  . labetalol (NORMODYNE) 200 MG tablet Take 1 tablet (200 mg total) by mouth 2 (two) times daily. 180 tablet 0  . LANTUS SOLOSTAR 100 UNIT/ML Solostar Pen INJECT 20 UNITS SUBCUTANEOUSLY ONCE DAILY. NEED TO MAKE APPOINTMENT. 15 mL 1  . nateglinide (STARLIX) 120 MG tablet Take 1 tablet (120 mg total) by mouth 3 (three) times daily with meals. 90 tablet 6  . potassium chloride SA (K-DUR,KLOR-CON) 20 MEQ tablet Take 1 tablet by mouth daily.     No current  facility-administered medications on file prior to visit.     BP (!) 154/70 (BP Location: Left Arm, Patient Position: Sitting, Cuff Size: Normal)   Temp 98.2 F (36.8 C) (Oral)   Ht 6' 2.5" (1.892 m)   Wt 242 lb 8 oz (110 kg)   BMI 30.72 kg/m       Objective:   Physical Exam  Constitutional: He is oriented to person, place, and time. He appears well-developed and well-nourished. No distress.  Cardiovascular: Normal rate, regular rhythm, normal heart sounds and intact distal pulses.  Exam reveals no gallop and no friction rub.   No murmur heard. Pulmonary/Chest: Effort normal and breath sounds normal. No respiratory distress. He has no wheezes. He has no rales. He exhibits no tenderness.  Neurological: He is alert and oriented to person, place, and time.  Skin: Skin is warm and dry. No rash noted. He is not diaphoretic. No erythema. No pallor.  Psychiatric: He has a normal mood and affect. His behavior is normal. Judgment and thought content normal.  Nursing note and vitals reviewed.     Assessment & Plan:  1. Type 2 diabetes mellitus with stage 3 chronic kidney disease, with long-term current use of insulin ([place]) - Has improved from 11 to 9.1  - I am going to increase Lantus from 22 units to 25 units nightly.  - Follow up in 3 months  - Continue to work on diet and exercise - POC HgB A1c   Dorothyann Peng, NP

## 2016-11-08 NOTE — Patient Instructions (Signed)
It was great seeing you again.   You have gone from 11 to 9.1 with your A1c   I am going to have you increase your Lantus from 22 units to 25 units.   Continue to work on diet and exercise    Follow up in three months

## 2016-11-11 LAB — CBC AND DIFFERENTIAL
HEMATOCRIT: 34 % — AB (ref 41–53)
Hemoglobin: 11.8 g/dL — AB (ref 13.5–17.5)
NEUTROS ABS: 4 /uL
Platelets: 276 10*3/uL (ref 150–399)
WBC: 7.2 10^3/mL

## 2016-11-11 LAB — HEPATIC FUNCTION PANEL
ALK PHOS: 83 U/L (ref 25–125)
ALT: 13 U/L (ref 10–40)
AST: 16 U/L (ref 14–40)
BILIRUBIN, TOTAL: 0.3 mg/dL

## 2016-11-11 LAB — BASIC METABOLIC PANEL
BUN: 31 mg/dL — AB (ref 4–21)
Creatinine: 2.2 mg/dL — AB (ref 0.6–1.3)
Glucose: 159 mg/dL
POTASSIUM: 3.8 mmol/L (ref 3.4–5.3)
Sodium: 146 mmol/L (ref 137–147)

## 2016-11-18 ENCOUNTER — Encounter: Payer: Self-pay | Admitting: Family Medicine

## 2016-11-22 ENCOUNTER — Other Ambulatory Visit: Payer: Self-pay | Admitting: Adult Health

## 2017-02-02 ENCOUNTER — Other Ambulatory Visit: Payer: Self-pay | Admitting: Adult Health

## 2017-02-02 NOTE — Telephone Encounter (Signed)
Sent to the pharmacy by e-scribe.  Has an appt with [name] scheduled for 02/07/17 for diabetes follow up.

## 2017-02-07 ENCOUNTER — Ambulatory Visit (INDEPENDENT_AMBULATORY_CARE_PROVIDER_SITE_OTHER): Payer: Medicare Other | Admitting: Adult Health

## 2017-02-07 ENCOUNTER — Encounter: Payer: Self-pay | Admitting: Adult Health

## 2017-02-07 ENCOUNTER — Telehealth: Payer: Self-pay | Admitting: Adult Health

## 2017-02-07 VITALS — BP 190/100 | Temp 98.2°F | Ht 74.5 in | Wt 242.0 lb

## 2017-02-07 DIAGNOSIS — E1122 Type 2 diabetes mellitus with diabetic chronic kidney disease: Secondary | ICD-10-CM

## 2017-02-07 DIAGNOSIS — I129 Hypertensive chronic kidney disease with stage 1 through stage 4 chronic kidney disease, or unspecified chronic kidney disease: Secondary | ICD-10-CM | POA: Diagnosis not present

## 2017-02-07 DIAGNOSIS — Z794 Long term (current) use of insulin: Secondary | ICD-10-CM | POA: Diagnosis not present

## 2017-02-07 DIAGNOSIS — N183 Chronic kidney disease, stage 3 (moderate): Secondary | ICD-10-CM | POA: Diagnosis not present

## 2017-02-07 LAB — POCT GLYCOSYLATED HEMOGLOBIN (HGB A1C): Hemoglobin A1C: 8.2

## 2017-02-07 NOTE — Telephone Encounter (Signed)
° ° °  Pt did not want to set up AWV at this time

## 2017-02-07 NOTE — Progress Notes (Signed)
Subjective:    Patient ID: [name] [name], male    DOB: October 15, 1945, 71 y.o.   MRN: [id number]  HPI  71 year old male who  has a past medical history of DIABETES MELLITUS, TYPE II (01/02/2007); HYPERLIPIDEMIA (06/18/2007); HYPERTENSION (01/02/2007); and RENAL INSUFFICIENCY (02/05/2008). He presents to the office today for three month follow up regarding diabetes. During has last visit his A1c had improved to 9.1 from 11. I had increased his Lantus from 22 to 25 units nightly.   Today in the office he reports that his blood sugar readings have been between 90- 130.   He has not been watching his diet nor exercising.   Today in the office his blood pressure is 210/110and on recheck 190/100. He does not monitor at home and is asymptomatic. He reports that he usually takes his medications around 7 am - he has not taken his medications this morning   BP Readings from Last 3 Encounters:  02/07/17 (!) 190/100  11/08/16 (!) 154/70  08/16/16 (!) 154/82     Review of Systems  Respiratory: Negative.   Cardiovascular: Negative.   Gastrointestinal: Negative.   Genitourinary: Negative.   Musculoskeletal: Negative.   Neurological: Negative.   All other systems reviewed and are negative.  Past Medical History:  Diagnosis Date  . DIABETES MELLITUS, TYPE II 01/02/2007  . HYPERLIPIDEMIA 06/18/2007  . HYPERTENSION 01/02/2007  . RENAL INSUFFICIENCY 02/05/2008    Social History   Social History  . Marital status: Married    Spouse name: N/A  . Number of children: N/A  . Years of education: N/A   Occupational History  . Not on file.   Social History Main Topics  . Smoking status: Never Smoker  . Smokeless tobacco: Never Used  . Alcohol use No  . Drug use: No  . Sexual activity: Not on file   Other Topics Concern  . 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 Likes to go to football games.        Past Surgical History:  Procedure Laterality Date  . SHOULDER SURGERY     left    Family History  Problem Relation Age of Onset  . Hypertension Mother   . Diabetes Mother   . Stroke Father   . Stroke Maternal Grandmother     Allergies  Allergen Reactions  . Bactrim [Sulfamethoxazole-Trimethoprim]   . Penicillins     REACTION: urticaria (hives)  . Statins     myalgia  . Sulfa Drugs Cross Reactors Hives, Itching and Swelling    Current Outpatient Prescriptions on File Prior to Visit  Medication Sig Dispense Refill  . amLODipine (NORVASC) 10 MG tablet Take 1 tablet (10 mg total) by mouth daily. 90 tablet 3  . benazepril (LOTENSIN) 40 MG tablet TAKE 1 TABLET BY MOUTH TWICE DAILY 180 tablet 1  . calcitRIOL (ROCALTROL) 0.25 MCG capsule Take 1 capsule by mouth daily.    . cyclobenzaprine (FLEXERIL) 10 MG tablet Take 1 tablet (10 mg total) by mouth 3 (three) times daily as needed for muscle spasms. 30 tablet 0  . fluvastatin (LESCOL) 40 MG capsule Take 1 capsule (40 mg total) by mouth at bedtime. 30 capsule 11  . furosemide (LASIX) 80 MG tablet 160 mg po q a.m  and 80 mg po q afternoon 270 tablet 3  . glucose blood (ONETOUCH VERIO) test strip 1 each by Other route daily. Use as instructed 100 each 11  . hydrALAZINE (APRESOLINE) 25 MG tablet TAKE 1 TABLET BY MOUTH THREE TIMES DAILY 270 tablet 3  . insulin aspart (NOVOLOG) 100 UNIT/ML injection Take 6 units with every meal unless blood sugar above 200 than take 10 units (Patient taking differently: Inject 10 Units into the skin 3 (three) times daily with meals. Take 6 units with every meal unless blood sugar above 200 than take 10 units) 3 vial 11  . Insulin Pen Needle (B-D UF III MINI PEN NEEDLES) 31G X 5 MM MISC USE AS DIRECTED 100 each 11  . labetalol (NORMODYNE) 200 MG tablet Take 1 tablet (200 mg total) by mouth 2 (two) times daily. 180 tablet 0  . LANTUS SOLOSTAR 100 UNIT/ML  Solostar Pen INJECT 20 UNITS SUBCUTANEOUSLY ONCE DAILY. NEED TO MAKE APPOINTMENT. 15 mL 0  . nateglinide (STARLIX) 120 MG tablet Take 1 tablet (120 mg total) by mouth 3 (three) times daily with meals. 90 tablet 6  . potassium chloride SA (K-DUR,KLOR-CON) 20 MEQ tablet Take 1 tablet by mouth daily.     No current facility-administered medications on file prior to visit.     BP (!) 190/100 (BP Location: Left Arm)   Temp 98.2 F (36.8 C) (Oral)   Ht 6' 2.5" (1.892 m)   Wt 242 lb (109.8 kg)   BMI 30.66 kg/m       Objective:   Physical Exam  Constitutional: He is oriented to person, place, and time. He appears well-developed and well-nourished. No distress.  Cardiovascular: Normal rate, regular rhythm, normal heart sounds and intact distal pulses.  Exam reveals no gallop and no friction rub.   No murmur heard. Pulmonary/Chest: Effort normal and breath sounds normal. No respiratory distress. He has no wheezes. He has no rales. He exhibits no tenderness.  Neurological: He is alert and oriented to person, place, and time.  Skin: Skin is warm and dry. No rash noted. He is not diaphoretic. No erythema. No pallor.  Psychiatric: He has a normal mood and affect. His behavior is normal. Thought content normal.  Nursing note and vitals reviewed.      Assessment & Plan:  1. Type 2 diabetes mellitus with stage 3 chronic kidney disease, with long-term current use of insulin ([facility])  - POCT A1C- 8.2 .Has improved from 9.1 - Stay on current dose.  - Educated on the importance of diet and exercise  - Follow up in 3 months  2. Hypertensive kidney disease - He refused clonidine in the office today  - He is going to go home and take his blood pressure medications  - Advised to get cuff today and start monitoring.  - Follow up if BP consistently above 160/90 - Advised to go to the ER with blurred vision, headache, slurred speech, or facial droop  Dorothyann Peng, NP

## 2017-02-14 ENCOUNTER — Other Ambulatory Visit: Payer: Self-pay | Admitting: Adult Health

## 2017-02-15 NOTE — Telephone Encounter (Signed)
Sent to the pharmacy by e-scribe. 

## 2017-04-25 ENCOUNTER — Other Ambulatory Visit: Payer: Self-pay | Admitting: Adult Health

## 2017-04-25 NOTE — Telephone Encounter (Signed)
Sent to the pharmacy by e-scribe. 

## 2017-05-09 ENCOUNTER — Ambulatory Visit: Payer: Medicare Other | Admitting: Adult Health

## 2017-05-17 ENCOUNTER — Other Ambulatory Visit: Payer: Self-pay | Admitting: Adult Health

## 2017-05-17 NOTE — Telephone Encounter (Signed)
No cpx since 2016

## 2017-05-18 NOTE — Telephone Encounter (Signed)
He can have 30 days. Needs a CPE for more refills.   Thanks

## 2017-05-19 NOTE — Telephone Encounter (Signed)
Sent to the pharmacy as directed. 

## 2017-06-06 ENCOUNTER — Other Ambulatory Visit: Payer: Self-pay | Admitting: Family Medicine

## 2017-06-06 MED ORDER — INSULIN GLARGINE 100 UNIT/ML SOLOSTAR PEN: PEN_INJECTOR | SUBCUTANEOUS | 0 refills | Status: DC

## 2017-06-13 ENCOUNTER — Ambulatory Visit: Payer: Medicare Other | Admitting: Adult Health

## 2017-06-19 ENCOUNTER — Encounter: Payer: Self-pay | Admitting: Adult Health

## 2017-06-19 ENCOUNTER — Ambulatory Visit: Payer: Medicare Other | Admitting: Adult Health

## 2017-06-19 VITALS — BP 160/90 | Temp 98.7°F | Wt 247.0 lb

## 2017-06-19 DIAGNOSIS — N183 Chronic kidney disease, stage 3 (moderate): Secondary | ICD-10-CM | POA: Diagnosis not present

## 2017-06-19 DIAGNOSIS — E1122 Type 2 diabetes mellitus with diabetic chronic kidney disease: Secondary | ICD-10-CM

## 2017-06-19 DIAGNOSIS — I129 Hypertensive chronic kidney disease with stage 1 through stage 4 chronic kidney disease, or unspecified chronic kidney disease: Secondary | ICD-10-CM

## 2017-06-19 DIAGNOSIS — Z794 Long term (current) use of insulin: Secondary | ICD-10-CM

## 2017-06-19 LAB — POCT GLYCOSYLATED HEMOGLOBIN (HGB A1C): Hemoglobin A1C: 8.8

## 2017-06-19 MED ORDER — BENAZEPRIL HCL 40 MG PO TABS: 40.0000 mg | ORAL_TABLET | Freq: Two times a day (BID) | ORAL | 0 refills | Status: DC

## 2017-06-19 MED ORDER — HYDRALAZINE HCL 25 MG PO TABS: 25.0000 mg | ORAL_TABLET | Freq: Three times a day (TID) | ORAL | 0 refills | Status: DC

## 2017-06-19 MED ORDER — AMLODIPINE BESYLATE 10 MG PO TABS: 10.0000 mg | ORAL_TABLET | Freq: Every day | ORAL | 0 refills | Status: DC

## 2017-06-19 NOTE — Patient Instructions (Addendum)
Your A1c is 8.8. I am going to have you increase Lantus 25 units nights   Increase Novolog from 10 to 12 units three times a day   Follow up in 3 months for your physical

## 2017-06-19 NOTE — Progress Notes (Signed)
Subjective:    Patient ID: [name] [name], male    DOB: 07/13/1945, 71 y.o.   MRN: [id number]  HPI  71 year old male who  has a past medical history of DIABETES MELLITUS, TYPE II (01/02/2007), HYPERLIPIDEMIA (06/18/2007), HYPERTENSION (01/02/2007), and RENAL INSUFFICIENCY (02/05/2008). He presents to the office today for three month follow up regarding diabetes and hypertension. His last A1c was 8.2 in August 2018. This has improved from 9.1. He is currently maintained on Starlix, Lantus, and Novolog.  Due to history of hypertension he takes Hydralazine 25 mg TID, Norvasc 10 mg, Lotensin 40 mg,  and Labetalol 200 mg BID. He reports that he has been monitoring his BP at home and it has been in the 150/80-90. He took his medications about 1 hour ago this morning. BP in the office is 160/90. He is going to see [facility] next month   Today in the office he reports that his blood sugars at home have been " a little high". He has not been eating a heart healthy diet   Wt Readings from Last 3 Encounters:  06/19/17 247 lb (112 kg)  02/07/17 242 lb (109.8 kg)  11/08/16 242 lb 8 oz (110 kg)    Review of Systems  Constitutional: Negative.   HENT: Negative.   Respiratory: Negative.   Cardiovascular: Negative.   Gastrointestinal: Negative.   Genitourinary: Negative.   Musculoskeletal: Negative.   Neurological: Negative.   Hematological: Negative.   Psychiatric/Behavioral: Negative.   All other systems reviewed and are negative.  Past Medical History:  Diagnosis Date  . DIABETES MELLITUS, TYPE II 01/02/2007  . HYPERLIPIDEMIA 06/18/2007  . HYPERTENSION 01/02/2007  . RENAL INSUFFICIENCY 02/05/2008    Social History   Socioeconomic History  . Marital status: Married    Spouse name: Not on file  . Number of children: Not on file  . Years of education: Not on file  . Highest education level: Not on file  Social Needs  . Financial resource strain: Not on file  . Food insecurity - worry:  Not on file  . Food insecurity - inability: Not on file  . Transportation needs - medical: Not on file  . Transportation needs - non-medical: Not on file  Occupational History  . Not on file  Tobacco Use  . Smoking status: Never Smoker  . Smokeless tobacco: Never Used  Substance and Sexual Activity  . Alcohol use: No  . Drug use: No  . Sexual activity: Not on file  Other Topics Concern  . 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 Likes to go to football games.     Past Surgical History:  Procedure Laterality Date  . SHOULDER SURGERY     left    Family History  Problem Relation Age of Onset  . Hypertension Mother   . Diabetes Mother   . Stroke Father   . Stroke Maternal Grandmother     Allergies  Allergen Reactions  . Bactrim [Sulfamethoxazole-Trimethoprim]   . Penicillins     REACTION: urticaria (hives)  . Statins     myalgia  . Sulfa Drugs Cross Reactors Hives, Itching and Swelling    Current Outpatient Medications on File Prior to Visit  Medication Sig Dispense Refill  . calcitRIOL (ROCALTROL) 0.25  MCG capsule Take 1 capsule by mouth daily.    . cyclobenzaprine (FLEXERIL) 10 MG tablet Take 1 tablet (10 mg total) by mouth 3 (three) times daily as needed for muscle spasms. 30 tablet 0  . fluvastatin (LESCOL) 40 MG capsule Take 1 capsule (40 mg total) by mouth at bedtime. 30 capsule 11  . furosemide (LASIX) 80 MG tablet 160 mg po q a.m and 80 mg po q afternoon 270 tablet 3  . glucose blood (ONETOUCH VERIO) test strip 1 each by Other route daily. Use as instructed 100 each 11  . insulin aspart (NOVOLOG) 100 UNIT/ML injection Take 6 units with every meal unless blood sugar above 200 than take 10 units (Patient taking differently: Inject 12 Units into the skin 3 (three) times daily with meals. Take 6 units with every  meal unless blood sugar above 200 than take 10 units) 3 vial 11  . Insulin Glargine (LANTUS SOLOSTAR) 100 UNIT/ML Solostar Pen INJECT 20 UNITS SUBCUTANEOUSLY ONCE DAILY. NEED TO MAKE APPOINTMENT. (Patient taking differently: Inject 25 Units into the skin daily at 10 pm. INJECT 20 UNITS SUBCUTANEOUSLY ONCE DAILY. NEED TO MAKE APPOINTMENT.) 2 pen 0  . Insulin Pen Needle (B-D UF III MINI PEN NEEDLES) 31G X 5 MM MISC USE AS DIRECTED 100 each 11  . labetalol (NORMODYNE) 200 MG tablet Take 1 tablet (200 mg total) by mouth 2 (two) times daily. 180 tablet 0  . nateglinide (STARLIX) 120 MG tablet Take 1 tablet (120 mg total) by mouth 3 (three) times daily with meals. 90 tablet 6  . potassium chloride SA (K-DUR,KLOR-CON) 20 MEQ tablet Take 1 tablet by mouth daily.     No current facility-administered medications on file prior to visit.     BP (!) 160/90 (BP Location: Left Arm)   Temp 98.7 F (37.1 C) (Oral)   Wt 247 lb (112 kg)   BMI 31.29 kg/m       Objective:   Physical Exam  Constitutional: He is oriented to person, place, and time. He appears well-developed and well-nourished. No distress.  Cardiovascular: Normal rate, regular rhythm, normal heart sounds and intact distal pulses. Exam reveals no gallop and no friction rub.  No murmur heard. Pulmonary/Chest: Effort normal and breath sounds normal. No respiratory distress. He has no wheezes. He has no rales. He exhibits no tenderness.  Musculoskeletal: Normal range of motion. He exhibits no edema, tenderness or deformity.  Neurological: He is alert and oriented to person, place, and time.  Skin: Skin is warm and dry. No rash noted. He is not diaphoretic. No erythema. No pallor.  Psychiatric: He has a normal mood and affect. His behavior is normal. Judgment and thought content normal.  Nursing note and vitals reviewed.     Assessment & Plan:  1. Hypertensive kidney disease - Follow up with [facility]  - amLODipine (NORVASC) 10 MG  tablet; Take 1 tablet (10 mg total) by mouth daily.  Dispense: 30 tablet; Refill: 0 - benazepril (LOTENSIN) 40 MG tablet; Take 1 tablet (40 mg total) by mouth 2 (two) times daily.  Dispense: 60 tablet; Refill: 0 - hydrALAZINE (APRESOLINE) 25 MG tablet; Take 1 tablet (25 mg total) by mouth 3 (three) times daily.  Dispense: 90 tablet; Refill: 0  2. Type 2 diabetes mellitus with stage 3 chronic kidney disease, with long-term current use of insulin ([facility]) - POCT A1C- 8.8. Has increased  - Will increase Lantus from 22 to 25 units  - Will increase Novolog from  10 to 12 units   Dorothyann Peng, NP

## 2017-06-28 ENCOUNTER — Telehealth: Payer: Self-pay | Admitting: Family Medicine

## 2017-06-28 NOTE — Telephone Encounter (Signed)
I do not see a lipid panel for this pt.

## 2017-06-28 NOTE — Telephone Encounter (Signed)
Copied from [place] [id number]. Topic: General - Other >> Jun 28, 2017  2:59 PM Yvette Rack wrote: [facility] [place], [address] [place] [phone] (Phone)  [phone] (Fax) call to see if you had received a fax about recommendations guide lines for statin Drugs please call them at [phone] they need this faxed back         Patient sees Dr Carlisle Cater at [facility]

## 2017-06-29 NOTE — Telephone Encounter (Signed)
We have tried a statin in the past on him and he had an intolerance to it. He has a CPE coming in March, we will talk about other alternatives then

## 2017-07-04 DIAGNOSIS — I639 Cerebral infarction, unspecified: Secondary | ICD-10-CM

## 2017-07-04 HISTORY — DX: Cerebral infarction, unspecified: I63.9

## 2017-07-08 ENCOUNTER — Observation Stay (HOSPITAL_COMMUNITY): Payer: Medicare Other

## 2017-07-08 ENCOUNTER — Other Ambulatory Visit: Payer: Self-pay

## 2017-07-08 ENCOUNTER — Emergency Department (HOSPITAL_COMMUNITY): Payer: Medicare Other

## 2017-07-08 ENCOUNTER — Inpatient Hospital Stay (HOSPITAL_COMMUNITY)
Admission: EM | Admit: 2017-07-08 | Discharge: 2017-07-11 | DRG: 041 | Disposition: A | Discharge status: Rehabilitation Facility | Payer: Medicare Other | Attending: Family Medicine | Admitting: Family Medicine

## 2017-07-08 ENCOUNTER — Encounter (HOSPITAL_COMMUNITY): Payer: Self-pay | Admitting: Radiology

## 2017-07-08 DIAGNOSIS — Z823 Family history of stroke: Secondary | ICD-10-CM

## 2017-07-08 DIAGNOSIS — I10 Essential (primary) hypertension: Secondary | ICD-10-CM

## 2017-07-08 DIAGNOSIS — Z794 Long term (current) use of insulin: Secondary | ICD-10-CM | POA: Diagnosis not present

## 2017-07-08 DIAGNOSIS — E1121 Type 2 diabetes mellitus with diabetic nephropathy: Secondary | ICD-10-CM

## 2017-07-08 DIAGNOSIS — I161 Hypertensive emergency: Secondary | ICD-10-CM | POA: Diagnosis not present

## 2017-07-08 DIAGNOSIS — R490 Dysphonia: Secondary | ICD-10-CM | POA: Diagnosis not present

## 2017-07-08 DIAGNOSIS — Z6830 Body mass index (BMI) 30.0-30.9, adult: Secondary | ICD-10-CM

## 2017-07-08 DIAGNOSIS — Z880 Allergy status to penicillin: Secondary | ICD-10-CM

## 2017-07-08 DIAGNOSIS — I129 Hypertensive chronic kidney disease with stage 1 through stage 4 chronic kidney disease, or unspecified chronic kidney disease: Secondary | ICD-10-CM | POA: Diagnosis present

## 2017-07-08 DIAGNOSIS — E7800 Pure hypercholesterolemia, unspecified: Secondary | ICD-10-CM | POA: Diagnosis not present

## 2017-07-08 DIAGNOSIS — R29704 NIHSS score 4: Secondary | ICD-10-CM | POA: Diagnosis present

## 2017-07-08 DIAGNOSIS — Z833 Family history of diabetes mellitus: Secondary | ICD-10-CM

## 2017-07-08 DIAGNOSIS — Z881 Allergy status to other antibiotic agents status: Secondary | ICD-10-CM

## 2017-07-08 DIAGNOSIS — E1122 Type 2 diabetes mellitus with diabetic chronic kidney disease: Secondary | ICD-10-CM | POA: Diagnosis present

## 2017-07-08 DIAGNOSIS — Z888 Allergy status to other drugs, medicaments and biological substances status: Secondary | ICD-10-CM

## 2017-07-08 DIAGNOSIS — R4701 Aphasia: Secondary | ICD-10-CM | POA: Diagnosis present

## 2017-07-08 DIAGNOSIS — I63412 Cerebral infarction due to embolism of left middle cerebral artery: Principal | ICD-10-CM | POA: Diagnosis present

## 2017-07-08 DIAGNOSIS — E669 Obesity, unspecified: Secondary | ICD-10-CM | POA: Diagnosis present

## 2017-07-08 DIAGNOSIS — I63512 Cerebral infarction due to unspecified occlusion or stenosis of left middle cerebral artery: Secondary | ICD-10-CM | POA: Diagnosis present

## 2017-07-08 DIAGNOSIS — E785 Hyperlipidemia, unspecified: Secondary | ICD-10-CM | POA: Diagnosis present

## 2017-07-08 DIAGNOSIS — E876 Hypokalemia: Secondary | ICD-10-CM | POA: Diagnosis present

## 2017-07-08 DIAGNOSIS — N183 Chronic kidney disease, stage 3 (moderate): Secondary | ICD-10-CM | POA: Diagnosis not present

## 2017-07-08 DIAGNOSIS — I639 Cerebral infarction, unspecified: Secondary | ICD-10-CM

## 2017-07-08 DIAGNOSIS — M109 Gout, unspecified: Secondary | ICD-10-CM | POA: Diagnosis present

## 2017-07-08 DIAGNOSIS — Z8249 Family history of ischemic heart disease and other diseases of the circulatory system: Secondary | ICD-10-CM

## 2017-07-08 DIAGNOSIS — Z882 Allergy status to sulfonamides status: Secondary | ICD-10-CM

## 2017-07-08 DIAGNOSIS — R482 Apraxia: Secondary | ICD-10-CM | POA: Diagnosis present

## 2017-07-08 DIAGNOSIS — G8191 Hemiplegia, unspecified affecting right dominant side: Secondary | ICD-10-CM | POA: Diagnosis present

## 2017-07-08 LAB — DIFFERENTIAL
BASOS ABS: 0 10*3/uL (ref 0.0–0.1)
BASOS PCT: 0 %
EOS ABS: 0.5 10*3/uL (ref 0.0–0.7)
Eosinophils Relative: 6 %
Lymphocytes Relative: 27 %
Lymphs Abs: 2.1 10*3/uL (ref 0.7–4.0)
MONOS PCT: 5 %
Monocytes Absolute: 0.4 10*3/uL (ref 0.1–1.0)
NEUTROS ABS: 4.7 10*3/uL (ref 1.7–7.7)
NEUTROS PCT: 62 %

## 2017-07-08 LAB — COMPREHENSIVE METABOLIC PANEL
ALT: 14 U/L — ABNORMAL LOW (ref 17–63)
AST: 23 U/L (ref 15–41)
Albumin: 3.8 g/dL (ref 3.5–5.0)
Alkaline Phosphatase: 87 U/L (ref 38–126)
Anion gap: 9 (ref 5–15)
BUN: 27 mg/dL — AB (ref 6–20)
CHLORIDE: 108 mmol/L (ref 101–111)
CO2: 22 mmol/L (ref 22–32)
Calcium: 8.6 mg/dL — ABNORMAL LOW (ref 8.9–10.3)
Creatinine, Ser: 2.32 mg/dL — ABNORMAL HIGH (ref 0.61–1.24)
GFR calc Af Amer: 31 mL/min — ABNORMAL LOW (ref >60)
GFR, EST NON AFRICAN AMERICAN: 27 mL/min — AB (ref >60)
Glucose, Bld: 207 mg/dL — ABNORMAL HIGH (ref 65–99)
Potassium: 4 mmol/L (ref 3.5–5.1)
SODIUM: 139 mmol/L (ref 135–145)
Total Bilirubin: 0.9 mg/dL (ref 0.3–1.2)
Total Protein: 6.8 g/dL (ref 6.5–8.1)

## 2017-07-08 LAB — I-STAT CHEM 8, ED
BUN: 30 mg/dL — AB (ref 6–20)
Calcium, Ion: 1.15 mmol/L (ref 1.15–1.40)
Chloride: 108 mmol/L (ref 101–111)
Creatinine, Ser: 2.3 mg/dL — ABNORMAL HIGH (ref 0.61–1.24)
Glucose, Bld: 210 mg/dL — ABNORMAL HIGH (ref 65–99)
HEMATOCRIT: 37 % — AB (ref 39.0–52.0)
HEMOGLOBIN: 12.6 g/dL — AB (ref 13.0–17.0)
Potassium: 3.7 mmol/L (ref 3.5–5.1)
SODIUM: 144 mmol/L (ref 135–145)
TCO2: 24 mmol/L (ref 22–32)

## 2017-07-08 LAB — CBC
HCT: 34 % — ABNORMAL LOW (ref 39.0–52.0)
Hemoglobin: 11.5 g/dL — ABNORMAL LOW (ref 13.0–17.0)
MCH: 26.4 pg (ref 26.0–34.0)
MCHC: 33.8 g/dL (ref 30.0–36.0)
MCV: 78.2 fL (ref 78.0–100.0)
PLATELETS: 236 10*3/uL (ref 150–400)
RBC: 4.35 MIL/uL (ref 4.22–5.81)
RDW: 13.6 % (ref 11.5–15.5)
WBC: 7.7 10*3/uL (ref 4.0–10.5)

## 2017-07-08 LAB — GLUCOSE, CAPILLARY
GLUCOSE-CAPILLARY: 185 mg/dL — AB (ref 65–99)
GLUCOSE-CAPILLARY: 194 mg/dL — AB (ref 65–99)

## 2017-07-08 LAB — CBG MONITORING, ED
GLUCOSE-CAPILLARY: 172 mg/dL — AB (ref 65–99)
Glucose-Capillary: 202 mg/dL — ABNORMAL HIGH (ref 65–99)

## 2017-07-08 LAB — I-STAT TROPONIN, ED: TROPONIN I, POC: 0.03 ng/mL (ref 0.00–0.08)

## 2017-07-08 LAB — APTT: APTT: 29 s (ref 24–36)

## 2017-07-08 LAB — PROTIME-INR
INR: 1.09
PROTHROMBIN TIME: 14 s (ref 11.4–15.2)

## 2017-07-08 MED ORDER — ACETAMINOPHEN 325 MG PO TABS: 650.0000 mg | ORAL_TABLET | ORAL | Status: DC | PRN

## 2017-07-08 MED ORDER — SODIUM CHLORIDE 0.9 % IV SOLN
INTRAVENOUS | Status: DC
  Administered 2017-07-08 – 2017-07-10 (×3): via INTRAVENOUS

## 2017-07-08 MED ORDER — NATEGLINIDE 120 MG PO TABS: 120.0000 mg | ORAL_TABLET | Freq: Three times a day (TID) | ORAL | Status: DC

## 2017-07-08 MED ORDER — CYCLOBENZAPRINE HCL 10 MG PO TABS: 10.0000 mg | ORAL_TABLET | Freq: Three times a day (TID) | ORAL | Status: DC | PRN

## 2017-07-08 MED ORDER — IOPAMIDOL (ISOVUE-370) INJECTION 76%
INTRAVENOUS | Status: AC
  Administered 2017-07-08: 100 mL
  Filled 2017-07-08: qty 100

## 2017-07-08 MED ORDER — DEXTROSE-NACL 5-0.45 % IV SOLN: INTRAVENOUS | Status: DC

## 2017-07-08 MED ORDER — SENNOSIDES-DOCUSATE SODIUM 8.6-50 MG PO TABS: 1.0000 | ORAL_TABLET | Freq: Every evening | ORAL | Status: DC | PRN

## 2017-07-08 MED ORDER — INSULIN GLARGINE 100 UNIT/ML ~~LOC~~ SOLN
25.0000 [IU] | Freq: Every day | SUBCUTANEOUS | Status: DC
  Administered 2017-07-08 – 2017-07-10 (×3): 25 [IU] via SUBCUTANEOUS
  Filled 2017-07-08 (×3): qty 0.25

## 2017-07-08 MED ORDER — HYDRALAZINE HCL 25 MG PO TABS
25.0000 mg | ORAL_TABLET | Freq: Three times a day (TID) | ORAL | Status: DC
  Administered 2017-07-08 – 2017-07-11 (×9): 25 mg via ORAL
  Filled 2017-07-08 (×9): qty 1

## 2017-07-08 MED ORDER — ACETAMINOPHEN 650 MG RE SUPP: 650.0000 mg | RECTAL | Status: DC | PRN

## 2017-07-08 MED ORDER — STROKE: EARLY STAGES OF RECOVERY BOOK
Freq: Once | Status: AC
  Administered 2017-07-08: 17:00:00
  Filled 2017-07-08: qty 1

## 2017-07-08 MED ORDER — GLUCOSE BLOOD VI STRP: 1.0000 | ORAL_STRIP | Freq: Every day | Status: DC

## 2017-07-08 MED ORDER — HYDRALAZINE HCL 20 MG/ML IJ SOLN
10.0000 mg | INTRAMUSCULAR | Status: DC | PRN
  Administered 2017-07-08 – 2017-07-11 (×3): 10 mg via INTRAVENOUS
  Filled 2017-07-08 (×2): qty 1

## 2017-07-08 MED ORDER — CALCITRIOL 0.25 MCG PO CAPS: 0.2500 ug | ORAL_CAPSULE | Freq: Every day | ORAL | Status: DC

## 2017-07-08 MED ORDER — ACETAMINOPHEN 160 MG/5ML PO SOLN: 650.0000 mg | ORAL | Status: DC | PRN

## 2017-07-08 MED ORDER — ASPIRIN 325 MG PO TABS
325.0000 mg | ORAL_TABLET | Freq: Once | ORAL | Status: DC
  Filled 2017-07-08: qty 1

## 2017-07-08 NOTE — Evaluation (Signed)
Clinical/Bedside Swallow Evaluation Patient Details  Name: [name] [name] MRN: [id number] Date of Birth: 04-17-1946  Today's Date: 07/08/2017 Time: SLP Start Time (ACUTE ONLY): 1520 SLP Stop Time (ACUTE ONLY): 1530 SLP Time Calculation (min) (ACUTE ONLY): 10 min  Past Medical History:  Past Medical History:  Diagnosis Date  . DIABETES MELLITUS, TYPE II 01/02/2007  . HYPERLIPIDEMIA 06/18/2007  . HYPERTENSION 01/02/2007  . RENAL INSUFFICIENCY 02/05/2008   Past Surgical History:  Past Surgical History:  Procedure Laterality Date  . SHOULDER SURGERY     left   HPI:  [name] [name] [name] [name] a 72 y.o.malewith medical history significant ofdiabetes mellitus type 2, CKD stage III, hypertension, hyperlipidemia was brought to the hospital for evaluation of confusion and expressive aphasia. MRI showed left MCA infarct. Pt failed stroke swallow screen; swallow evaluation ordered.   Assessment / Plan / Recommendation Clinical Impression  Patient presents with oropharyngeal swallow which appears at bedside to be within functional limits with adequate airway protection. Mild R facial weakness noted which does not appear to impact function. No overt signs of aspiration observed despite challenging with consecutive straw sips of thin liquids in excess of 3oz. Recommend regular diet with thin liquids, meds whole with liquid. Will s/o for dysphagia.   SLP Visit Diagnosis: Dysphagia, unspecified (R13.10)    Aspiration Risk  Mild aspiration risk    Diet Recommendation Regular;Thin liquid   Liquid Administration via: Cup;Straw Medication Administration: Whole meds with liquid Supervision: Patient able to self feed Compensations: Slow rate;Small sips/bites    Other  Recommendations Oral Care Recommendations: Oral care BID   Follow up Recommendations Other (comment)(tba)      Frequency and Duration            Prognosis Prognosis for Safe Diet Advancement: Good      Swallow Study   General  Date of Onset: 07/08/17 HPI: [name] [name] [name] [name] a 72 y.o.malewith medical history significant ofdiabetes mellitus type 2, CKD stage III, hypertension, hyperlipidemia was brought to the hospital for evaluation of confusion and expressive aphasia. MRI showed left MCA infarct. Pt failed stroke swallow screen; swallow evaluation ordered. Type of Study: Bedside Swallow Evaluation Previous Swallow Assessment: none in chart Diet Prior to this Study: NPO Temperature Spikes Noted: No Respiratory Status: Room air History of Recent Intubation: No Behavior/Cognition: Alert;Cooperative Oral Cavity Assessment: Within Functional Limits Oral Care Completed by SLP: No Oral Cavity - Dentition: Adequate natural dentition Vision: Functional for self-feeding Self-Feeding Abilities: Able to feed self;Needs set up Patient Positioning: Upright in bed Baseline Vocal Quality: Normal Volitional Cough: Strong Volitional Swallow: Able to elicit    Oral/Motor/Sensory Function Overall Oral Motor/Sensory Function: Mild impairment Facial ROM: Reduced right Facial Symmetry: Abnormal symmetry right Facial Strength: Within Functional Limits Facial Sensation: Within Functional Limits Lingual ROM: Within Functional Limits Lingual Symmetry: Within Functional Limits Lingual Strength: Within Functional Limits Lingual Sensation: Within Functional Limits Velum: Within Functional Limits Mandible: Within Functional Limits   Ice Chips Ice chips: Within functional limits Presentation: Spoon   Thin Liquid Thin Liquid: Within functional limits Presentation: Cup;Self Fed;Straw    Nectar Thick Nectar Thick Liquid: Not tested   Honey Thick Honey Thick Liquid: Not tested   Puree Puree: Within functional limits Presentation: Self Fed;Spoon   Solid   GO   Deneise Lever, [place], CCC-SLP Speech-Language Pathologist [phone] Solid: Within functional limits Presentation: [place] 07/08/2017,3:45  PM

## 2017-07-08 NOTE — Evaluation (Signed)
Speech Language Pathology Evaluation Patient Details Name: [name] [name] MRN: [id number] DOB: 1945-10-22 Today's Date: 07/08/2017 Time: 8250-5397 SLP Time Calculation (min) (ACUTE ONLY): 11 min  Problem List:  Patient Active Problem List   Diagnosis Date Noted  . Aphasia 07/08/2017  . Gout 07/21/2011  . Prostatitis, chronic 07/14/2010  . RENAL INSUFFICIENCY 02/05/2008  . Hyperlipidemia 06/18/2007  . DM type 2 (diabetes mellitus, type 2) ([place]) 01/02/2007  . Hypertensive kidney disease 01/02/2007   Past Medical History:  Past Medical History:  Diagnosis Date  . DIABETES MELLITUS, TYPE II 01/02/2007  . HYPERLIPIDEMIA 06/18/2007  . HYPERTENSION 01/02/2007  . RENAL INSUFFICIENCY 02/05/2008   Past Surgical History:  Past Surgical History:  Procedure Laterality Date  . SHOULDER SURGERY     left   HPI:  [name] [name] a 72 y.o.malewith medical history significant ofdiabetes mellitus type 2, CKD stage III, hypertension, hyperlipidemia was brought to the hospital for evaluation of confusion and expressive aphasia. MRI showed left MCA infarct. Pt failed stroke swallow screen; swallow evaluation ordered.   Assessment / Plan / Recommendation Clinical Impression   Patient presents with greater expressive than receptive aphasia and probable verbal apraxia. Spontaneous speech output limited;  difficulties initiating and perseverations during automatic speech tasks. Occasional groping oral movements noted during oral motor examination. Repetition impaired for longer sentences. Educated pt re: expressive language difficulties and SLP interventions to facilitate communication. Will follow acutely.    SLP Assessment  SLP Recommendation/Assessment: Patient needs continued Speech Lanaguage Pathology Services SLP Visit Diagnosis: Aphasia (R47.01);Apraxia (R48.2)    Follow Up Recommendations  Other (comment)(TBD)    Frequency and Duration min 2x/week  2 weeks      SLP  Evaluation Cognition  Overall Cognitive Status: Within Functional Limits for tasks assessed Arousal/Alertness: Awake/alert Orientation Level: Other (comment)(unable to state but confirms via Y/N) Attention: Sustained Sustained Attention: Appears intact       Comprehension  Auditory Comprehension Overall Auditory Comprehension: Impaired Yes/No Questions: Within Functional Limits Conversation: Simple Other Conversation Comments: requires repetition for more complex commands, instructions Interfering Components: Motor planning Visual Recognition/Discrimination Discrimination: Within Function Limits Reading Comprehension Reading Status: Not tested    Expression Expression Primary Mode of Expression: Verbal Verbal Expression Overall Verbal Expression: Impaired Initiation: Impaired Automatic Speech: Name;Social Response(mod cues for days, months, perseverations noted) Level of Generative/Spontaneous Verbalization: Phrase Repetition: Impaired Level of Impairment: Sentence level Naming: Impairment Confrontation: Impaired(5/10) Verbal Errors: Perseveration Pragmatics: No impairment Effective Techniques: Semantic cues;Sentence completion;Phonemic cues Written Expression Dominant Hand: Right Written Expression: Not tested   Oral / Motor  Oral Motor/Sensory Function Overall Oral Motor/Sensory Function: Mild impairment Facial ROM: Reduced right Facial Symmetry: Abnormal symmetry right Facial Strength: Within Functional Limits Facial Sensation: Within Functional Limits Lingual ROM: Within Functional Limits Lingual Symmetry: Within Functional Limits Lingual Strength: Within Functional Limits Lingual Sensation: Within Functional Limits Velum: Within Functional Limits Mandible: Within Functional Limits Motor Speech Overall Motor Speech: Impaired Respiration: Within functional limits Phonation: Normal Resonance: Within functional limits Articulation: Within functional  limitis Intelligibility: Intelligible Motor Planning: Impaired Level of Impairment: Sentence Motor Speech Errors: Groping for words;Inconsistent   GO                   Deneise Lever, [place], [place] Speech-Language Pathologist [phone]  Aliene Altes 07/08/2017, 3:54 PM

## 2017-07-08 NOTE — ED Notes (Signed)
Per Admiting MD- not to give PRN BP meds unless Systolic meets [phone]

## 2017-07-08 NOTE — ED Notes (Signed)
Pt at MRI. Pt has mild expressive aphasia, Failed stroke swallow. Waiting for SP eval, NPO. NIH 2.

## 2017-07-08 NOTE — ED Notes (Signed)
Attempted report 

## 2017-07-08 NOTE — H&P (Signed)
History and Physical    [name] [name] CHY:[id number] DOB: [birth year] DOA: 07/08/2017  PCP: Dorothyann Peng, NP Patient coming from: home  Chief Complaint: Confusion  HPI: [name] [name] is a 72 y.o. male with medical history significant of diabetes mellitus type 2, CKD stage III, hypertension, hyperlipidemia was brought to the hospital for evaluation of confusion and expressive aphasia.  Patient is slightly confused and unclear about all the events therefore history per wife was at bedside. According to the wife patient went to bed in the normal state little before 2 AM this morning around 8 AM when patient woke up he appeared to be confused.  Wife stated he started doing "funny things" which was unusual of him, sometimes staring and only speaking in short sentences or couple of words.  Due to persistence of the symptoms for couple of hours she decided to bring the patient to the hospital for further evaluation.  Denies any chest pain, shortness of breath, lightheadedness, dizziness and other complaints.  Patient has been compliant with his medications and denies any previous history of stroke.  Denies any tobacco use and illicit drug use, only drinks wine occasionally.  In the ER patient was noted to have some right-sided weakness and expressive aphasia.  Initial vital signs showed his systolic blood pressure was greater than 220 and came down to systolic of 878 on its own.  CT of the head was negative, CTA of the head and neck did not show any acute large vessel occlusion.  On physical exam he was noted to have right upper extremity weakness with some expressive aphasia.  Right upper extremity had somewhat resolved by the time I evaluated the patient.  He was admitted for further care to the hospital.   Review of Systems: As per HPI otherwise 10 point review of systems negative.   Past Medical History:  Diagnosis Date  . DIABETES MELLITUS, TYPE II 01/02/2007  . HYPERLIPIDEMIA 06/18/2007  .  HYPERTENSION 01/02/2007  . RENAL INSUFFICIENCY 02/05/2008    Past Surgical History:  Procedure Laterality Date  . SHOULDER SURGERY     left     reports that  has never smoked. he has never used smokeless tobacco. He reports that he does not drink alcohol or use drugs.  Allergies  Allergen Reactions  . Bactrim [Sulfamethoxazole-Trimethoprim]   . Penicillins     REACTION: urticaria (hives)  . Statins     myalgia  . Sulfa Drugs Cross Reactors Hives, Itching and Swelling    Family History  Problem Relation Age of Onset  . Hypertension Mother   . Diabetes Mother   . Stroke Father   . Stroke Maternal Grandmother      Prior to Admission medications   Medication Sig Start Date End Date Taking? Authorizing Provider  amLODipine (NORVASC) 10 MG tablet Take 1 tablet (10 mg total) by mouth daily. 06/19/17   Nafziger, [name], NP  benazepril (LOTENSIN) 40 MG tablet Take 1 tablet (40 mg total) by mouth 2 (two) times daily. 06/19/17   Nafziger, [name], NP  calcitRIOL (ROCALTROL) 0.25 MCG capsule Take 1 capsule by mouth daily. 03/19/13   [provider]  cyclobenzaprine (FLEXERIL) 10 MG tablet Take 1 tablet (10 mg total) by mouth 3 (three) times daily as needed for muscle spasms. 08/16/16   Nafziger, [name], NP  fluvastatin (LESCOL) 40 MG capsule Take 1 capsule (40 mg total) by mouth at bedtime. 06/03/15   Nafziger, [name], NP  furosemide (LASIX) 80 MG tablet 160  mg po q a.m and 80 mg po q afternoon 06/24/13   Swords, Darrick Penna, MD  glucose blood (ONETOUCH VERIO) test strip 1 each by Other route daily. Use as instructed 06/24/13   Swords, Darrick Penna, MD  hydrALAZINE (APRESOLINE) 25 MG tablet Take 1 tablet (25 mg total) by mouth 3 (three) times daily. 06/19/17   Nafziger, [name], NP  insulin aspart (NOVOLOG) 100 UNIT/ML injection Take 6 units with every meal unless blood sugar above 200 than take 10 units Patient taking differently: Inject 12 Units into the skin 3 (three) times daily with meals. Take 6  units with every meal unless blood sugar above 200 than take 10 units 09/28/12   Swords, Darrick Penna, MD  Insulin Glargine (LANTUS SOLOSTAR) 100 UNIT/ML Solostar Pen INJECT 20 UNITS SUBCUTANEOUSLY ONCE DAILY. NEED TO MAKE APPOINTMENT. Patient taking differently: Inject 25 Units into the skin daily at 10 pm. INJECT 20 UNITS SUBCUTANEOUSLY ONCE DAILY. NEED TO MAKE APPOINTMENT. 06/02/17   Colin Benton R, DO  Insulin Pen Needle (B-D UF III MINI PEN NEEDLES) 31G X 5 MM MISC USE AS DIRECTED 04/20/16   Nafziger, [name], NP  labetalol (NORMODYNE) 200 MG tablet Take 1 tablet (200 mg total) by mouth 2 (two) times daily. 07/28/14   Swords, Darrick Penna, MD  nateglinide (STARLIX) 120 MG tablet Take 1 tablet (120 mg total) by mouth 3 (three) times daily with meals. 06/03/15   Nafziger, [name], NP  potassium chloride SA (K-DUR,KLOR-CON) 20 MEQ tablet Take 1 tablet by mouth daily. 04/19/13   [provider]    Physical Exam: Vitals:   07/08/17 0845 07/08/17 0930 07/08/17 0935 07/08/17 0940  BP: (!) 170/100  (!) 190/87   Pulse: 73 75 72   Resp: 20 18 20    Temp: 98.7 F (37.1 C)     SpO2: 98% 100% 100%   Weight:    111.1 kg (245 lb)  Height:    6\' 3"  (1.905 m)      Constitutional: NAD, calm, comfortable Vitals:   07/08/17 0845 07/08/17 0930 07/08/17 0935 07/08/17 0940  BP: (!) 170/100  (!) 190/87   Pulse: 73 75 72   Resp: 20 18 20    Temp: 98.7 F (37.1 C)     SpO2: 98% 100% 100%   Weight:    111.1 kg (245 lb)  Height:    6\' 3"  (1.905 m)   Eyes: PERRL, lids and conjunctivae normal ENMT: Mucous membranes are moist. Posterior pharynx clear of any exudate or lesions.Normal dentition.  Neck: normal, supple, no masses, no thyromegaly Respiratory: clear to auscultation bilaterally, no wheezing, no crackles. Normal respiratory effort. No accessory muscle use.  Cardiovascular: Regular rate and rhythm, no murmurs / rubs / gallops. No extremity edema. 2+ pedal pulses. No carotid bruits.  Abdomen: no  tenderness, no masses palpated. No hepatosplenomegaly. Bowel sounds positive.  Musculoskeletal: no clubbing / cyanosis. No joint deformity upper and lower extremities. Good ROM, no contractures. Normal muscle tone.  Skin: no rashes, lesions, ulcers. No induration Neurologic: CN 2-12 grossly intact. Sensation intact, DTR normal. Strength 5/5 in all 4.  Psychiatric: Normal judgment and insight. Alert and oriented x 2 (name and place). Normal mood.     Labs on Admission: I have personally reviewed following labs and imaging studies  CBC: Recent Labs  Lab 07/08/17 0842 07/08/17 0847  WBC 7.7  --   NEUTROABS 4.7  --   HGB 11.5* 12.6*  HCT 34.0* 37.0*  MCV 78.2  --  PLT 236  --    Basic Metabolic Panel: Recent Labs  Lab 07/08/17 0842 07/08/17 0847  NA 139 144  K 4.0 3.7  CL 108 108  CO2 22  --   GLUCOSE 207* 210*  BUN 27* 30*  CREATININE 2.32* 2.30*  CALCIUM 8.6*  --    GFR: Estimated Creatinine Clearance: 40.2 mL/min (A) (by C-G formula based on SCr of 2.3 mg/dL (H)). Liver Function Tests: Recent Labs  Lab 07/08/17 0842  AST 23  ALT 14*  ALKPHOS 87  BILITOT 0.9  PROT 6.8  ALBUMIN 3.8   No results for input(s): LIPASE, AMYLASE in the last 168 hours. No results for input(s): AMMONIA in the last 168 hours. Coagulation Profile: Recent Labs  Lab 07/08/17 0842  INR 1.09   Cardiac Enzymes: No results for input(s): CKTOTAL, CKMB, CKMBINDEX, TROPONINI in the last 168 hours. BNP (last 3 results) No results for input(s): PROBNP in the last 8760 hours. HbA1C: No results for input(s): HGBA1C in the last 72 hours. CBG: Recent Labs  Lab 07/08/17 0840  GLUCAP 202*   Lipid Profile: No results for input(s): CHOL, HDL, LDLCALC, TRIG, CHOLHDL, LDLDIRECT in the last 72 hours. Thyroid Function Tests: No results for input(s): TSH, T4TOTAL, FREET4, T3FREE, THYROIDAB in the last 72 hours. Anemia Panel: No results for input(s): VITAMINB12, FOLATE, FERRITIN, TIBC, IRON,  RETICCTPCT in the last 72 hours. Urine analysis:    Component Value Date/Time   COLORURINE yellow 07/14/2010 0926   APPEARANCEUR Cloudy 07/14/2010 0926   LABSPEC 1.015 07/14/2010 0926   PHURINE 5.5 07/14/2010 0926   HGBUR large 07/14/2010 0926   BILIRUBINUR n 06/02/2015 1024   PROTEINUR n 06/02/2015 1024   UROBILINOGEN 0.2 06/02/2015 1024   UROBILINOGEN 0.2 07/14/2010 0926   NITRITE n 06/02/2015 1024   NITRITE negative 07/14/2010 0926   LEUKOCYTESUR Negative 06/02/2015 1024   Sepsis Labs: !!!!!!!!!!!!!!!!!!!!!!!!!!!!!!!!!!!!!!!!!!!! @LABRCNTIP (procalcitonin:4,lacticidven:4) )No results found for this or any previous visit (from the past 240 hour(s)).   Radiological Exams on Admission: Ct Angio Head W Or Wo Contrast  Result Date: 07/08/2017 CLINICAL DATA:  Expressive aphasia and RIGHT-sided weakness, improving. EXAM: CT ANGIOGRAPHY HEAD AND NECK TECHNIQUE: Multidetector CT imaging of the head and neck was performed using the standard protocol during bolus administration of intravenous contrast. Multiplanar CT image reconstructions and MIPs were obtained to evaluate the vascular anatomy. Carotid stenosis measurements (when applicable) are obtained utilizing NASCET criteria, using the distal internal carotid diameter as the denominator. CONTRAST:  50 mL ISOVUE-370 IOPAMIDOL (ISOVUE-370) INJECTION 76% COMPARISON:  CT perfusion reported separately. Noncontrast CT head reported earlier. FINDINGS: CTA NECK Aortic arch: Standard branching. Imaged portion shows no evidence of aneurysm or dissection. No significant stenosis of the major arch vessel origins. Right carotid system: Calcified and noncalcified plaque at the bifurcation. No evidence of dissection, stenosis (50% or greater) or occlusion. Left carotid system: Calcified and particularly noncalcified plaque at the bifurcation. Lipid laden soft plaque extends from the bifurcation nearly 2 cm into cervical ICA, see sagittal image 148 series 14. No  evidence of frank dissection, stenosis (50% or greater) or occlusion. Vertebral arteries: BILATERAL patent, LEFT slightly larger. No evidence of dissection, stenosis (50% or greater) or occlusion. Nonvascular soft tissues:  Noncontributory. CTA HEAD Anterior circulation: The skullbase, cavernous, and supraclinoid ICA segments appear widely patent. There is moderate irregularity of the A1 ACA on the LEFT, non worrisome given the dominant RIGHT A1 ACA. No MCA stenosis or occlusion. No significant stenosis, proximal occlusion, aneurysm, or  vascular malformation. Posterior circulation: Both vertebral arteries contribute to basilar formation, LEFT dominant. Focal narrowing RIGHT P1 PCA estimated 50%. No significant stenosis, proximal occlusion, aneurysm, or vascular malformation. Venous sinuses: As permitted by contrast timing, patent. Anatomic variants: None of significance. Delayed phase:  Not performed. Review of the MIP images confirms the above findings. IMPRESSION: No extracranial flow reducing stenosis, but lipid laden plaque does extend for almost 2 cm into the LEFT ICA, areas concerning as a source of recurrent/distal emboli. See discussion above. Findings discussed with ordering provider. Non stenotic calcified and non calcified plaque at both bifurcations. No intracranial large vessel occlusion or significant proximal intracranial anterior circulation stenosis. Electronically Signed   By: Staci Righter M.D.   On: 07/08/2017 09:58   Ct Angio Neck W Or Wo Contrast  Result Date: 07/08/2017 CLINICAL DATA:  Expressive aphasia and RIGHT-sided weakness, improving. EXAM: CT ANGIOGRAPHY HEAD AND NECK TECHNIQUE: Multidetector CT imaging of the head and neck was performed using the standard protocol during bolus administration of intravenous contrast. Multiplanar CT image reconstructions and MIPs were obtained to evaluate the vascular anatomy. Carotid stenosis measurements (when applicable) are obtained utilizing  NASCET criteria, using the distal internal carotid diameter as the denominator. CONTRAST:  50 mL ISOVUE-370 IOPAMIDOL (ISOVUE-370) INJECTION 76% COMPARISON:  CT perfusion reported separately. Noncontrast CT head reported earlier. FINDINGS: CTA NECK Aortic arch: Standard branching. Imaged portion shows no evidence of aneurysm or dissection. No significant stenosis of the major arch vessel origins. Right carotid system: Calcified and noncalcified plaque at the bifurcation. No evidence of dissection, stenosis (50% or greater) or occlusion. Left carotid system: Calcified and particularly noncalcified plaque at the bifurcation. Lipid laden soft plaque extends from the bifurcation nearly 2 cm into cervical ICA, see sagittal image 148 series 14. No evidence of frank dissection, stenosis (50% or greater) or occlusion. Vertebral arteries: BILATERAL patent, LEFT slightly larger. No evidence of dissection, stenosis (50% or greater) or occlusion. Nonvascular soft tissues:  Noncontributory. CTA HEAD Anterior circulation: The skullbase, cavernous, and supraclinoid ICA segments appear widely patent. There is moderate irregularity of the A1 ACA on the LEFT, non worrisome given the dominant RIGHT A1 ACA. No MCA stenosis or occlusion. No significant stenosis, proximal occlusion, aneurysm, or vascular malformation. Posterior circulation: Both vertebral arteries contribute to basilar formation, LEFT dominant. Focal narrowing RIGHT P1 PCA estimated 50%. No significant stenosis, proximal occlusion, aneurysm, or vascular malformation. Venous sinuses: As permitted by contrast timing, patent. Anatomic variants: None of significance. Delayed phase:  Not performed. Review of the MIP images confirms the above findings. IMPRESSION: No extracranial flow reducing stenosis, but lipid laden plaque does extend for almost 2 cm into the LEFT ICA, areas concerning as a source of recurrent/distal emboli. See discussion above. Findings discussed with  ordering provider. Non stenotic calcified and non calcified plaque at both bifurcations. No intracranial large vessel occlusion or significant proximal intracranial anterior circulation stenosis. Electronically Signed   By: Staci Righter M.D.   On: 07/08/2017 09:58   Ct Cerebral Perfusion W Contrast  Result Date: 07/08/2017 CLINICAL DATA:  Code stroke. Focal neuro deficit less than 6 hours. Transient ischemic attack versus hypertensive encephalopathy. Stroke risk factors include hypertension and diabetes. Expressive aphasia and RIGHT-sided weakness, improving exam. Initial systolic blood pressure to 40 is improving. EXAM: CT PERFUSION BRAIN TECHNIQUE: Multiphase CT imaging of the brain was performed following IV bolus contrast injection. Subsequent parametric perfusion maps were calculated using RAPID software. CONTRAST:  50 mL ISOVUE-370 IOPAMIDOL (ISOVUE-370) INJECTION 76%  COMPARISON:  Code stroke CT earlier in the day FINDINGS: CT Brain Perfusion Findings: CBF (<30%) Volume: 44mL Perfusion (Tmax>6.0s) volume: 43mL Mismatch Volume: 34mL Mismatch location:  LEFT posterior frontal cortex. IMPRESSION: No core infarct is demonstrated. CBF (< 30%) volume: 0 mL. There is a small area of perfusion mismatch LEFT posterior frontal cortex. Electronically Signed   By: Staci Righter M.D.   On: 07/08/2017 09:36   Ct Head Code Stroke Wo Contrast  Result Date: 07/08/2017 CLINICAL DATA:  Code stroke.  RIGHT-sided weakness with  aphasia. EXAM: CT HEAD WITHOUT CONTRAST TECHNIQUE: Contiguous axial images were obtained from the base of the skull through the vertex without intravenous contrast. COMPARISON:  None. FINDINGS: Brain: No evidence of acute infarction, hemorrhage, hydrocephalus, extra-axial collection or mass lesion/mass effect. Normal for age cerebral volume. Mild hypoattenuation of the white matter, possible small vessel disease. Vascular: No hyperdense vessel or unexpected calcification. Skull: Normal. Negative for  fracture or focal lesion. Sinuses/Orbits: No acute finding. Other: None. ASPECTS [facility] Stroke Program Early CT Score) - Ganglionic level infarction (caudate, lentiform nuclei, internal capsule, insula, M1-M3 cortex): 7 - Supraganglionic infarction (M4-M6 cortex): 3 Total score (0-10 with 10 being normal): 10 IMPRESSION: 1. Negative exam 2. ASPECTS is 10. These results were communicated to Dr. Lorraine Lax At 8:59 amon 1/5/2019by text page via the [facility] messaging system. Electronically Signed   By: Staci Righter M.D.   On: 07/08/2017 09:02    EKG: Independently reviewed.   Assessment/Plan Active Problems:   Aphasia    Expressive aphasia Altered mental status Hypertensive emergency - Admitted to the hospital due to concerns of hypertensive emergency causing his symptoms versus TIA/stroke -Stroke protocol initiated -CT of the head is negative for any acute findings, CTA head and neck does not show any large vessel occlusion - Will order MRI of the brain without contrast, echocardiogram -Aspirin 325 mg ordered, will need to be on statin -Check hemoglobin A1c, lipid panel, TSH -Continue neurochecks - Maintain n.p.o. status until cleared by speech and swallow, in the meantime every 4 hours Accu-Chek - start the patient on D5 half-normal saline 75cc/hrs -Permissive hypertension, treat with IV medication if systolic greater than 027 -Neurology has been consulted  Diabetes mellitus type 2 insulin-dependent -Continue home regimen of Lantus, Accu-Chek and sliding scale every 4 hours for now while he is n.p.o. - Follow-up hemoglobin A1c  Essential hypertension with elevated blood pressure -Currently p.o. medications on hold due to permissive hypertension, use IV hydralazine if necessary if systolic greater than 741  Hyperlipidemia -Check lipid panel, statin  CKD stage III -Secondary to uncontrolled hypertension and diabetes mellitus type 2 - Received contrast for CTA, will gently hydrate him  with D5 half-normal saline -Avoid nephrotoxic drugs, monitor creatinine and urine output -Needs to follow-up with outpatient nephrology   DVT prophylaxis: SCDs Code Status: Full  Family Communication: Wife at bedside  Disposition Plan:  TBD Consults called: Neurology    Neesha Langton Arsenio Loader MD [facility] Pager [phone]  If 7PM-7AM, please contact night-coverage [email or web address] Password [email or web address]  07/08/2017, 10:29 AM

## 2017-07-08 NOTE — ED Triage Notes (Signed)
LSN [id number]. Arrival to ED [phone]. Neurologist arrival at [phone]. Labs ontained at 0831Arrival in CT at [place]. Pt here by EMS, from home. Pt wife called out for blood sugar issues. C BG 202. Pt hypertensive with EMS, 200s/100s. 16 G PIV placed to [place] by EMS. Pt noted to have right sided drift, mild expressive aphasia, and some neglect to right side. Pt wife states his smile is at baseline. Pt initial NIH at the bridge was a 4 at 0840. Repeat NIH performed by RN at [phone] was 2. EMS reported prior to leaving that the patient symptoms already seemed to be improving. Pt was able to stand and pivot to stretcher, was reported to have an unsteady gait when walking with wife.

## 2017-07-08 NOTE — ED Notes (Signed)
Neurologist aware of pt BP.

## 2017-07-08 NOTE — ED Notes (Signed)
Patient transported to MRI 

## 2017-07-08 NOTE — ED Notes (Signed)
Handoff report given to 3 [facility].

## 2017-07-08 NOTE — Consult Note (Addendum)
Requesting Physician: Dr.     Laurel Dimmer Complaint: Aphasia, right side weakness  History obtained from: Patient and Chart  HPI:                                                                                                                                       [name] [name] is an 72 y.o. male male with medical history significant of diabetes mellitus type 2, CKD stage III, hypertension, hyperlipidemia presented as stroke alert with aphasia. LSN was 2 am according to wife.  When he woke up at 8am,he was confused, speaking only few words. After few hours, EMS was alerted. BP was 914 systolic per EMS. On arrival patient had aphasia and mild right side weakness.   Date last known well: 1.5.19 Time last known well: 2 am tPA Given: no, outside window NIHSS: 4 Baseline MRS 0   Past Medical History:  Diagnosis Date  . DIABETES MELLITUS, TYPE II 01/02/2007  . HYPERLIPIDEMIA 06/18/2007  . HYPERTENSION 01/02/2007  . RENAL INSUFFICIENCY 02/05/2008    Past Surgical History:  Procedure Laterality Date  . SHOULDER SURGERY     left    Family History  Problem Relation Age of Onset  . Hypertension Mother   . Diabetes Mother   . Stroke Father   . Stroke Maternal Grandmother    Social History:  reports that  has never smoked. he has never used smokeless tobacco. He reports that he does not drink alcohol or use drugs.  Allergies:  Allergies  Allergen Reactions  . Bactrim [Sulfamethoxazole-Trimethoprim] Hives, Itching and Swelling  . Penicillins     Has patient had a PCN reaction causing immediate rash, facial/tongue/throat swelling, SOB or lightheadedness with hypotension: Unk Has patient had a PCN reaction causing severe rash involving mucus membranes or skin necrosis: Unk Has patient had a PCN reaction that required hospitalization: Unk Has patient had a PCN reaction occurring within the last 10 years: No If all of the above answers are "NO", then may proceed with Cephalosporin use.  .  Statins     myalgia  . Sulfa Drugs Cross Reactors Hives, Itching and Swelling    Medications:                                                                                                                        I reviewed home medications   ROS:  14 systems reviewed and negative except above    Examination:                                                                                                      General: Appears well-developed and well-nourished.  Psych: Affect appropriate to situation Eyes: No scleral injection HENT: No OP obstrucion Head: Normocephalic.  Cardiovascular: Normal rate and regular rhythm.  Respiratory: Effort normal and breath sounds normal to anterior ascultation GI: Soft.  No distension. There is no tenderness.  Skin: WDI   Neurological Examination Mental Status: Alert, unable to answer all questions, could not name month.  Able to follow commands without difficulty. Cranial Nerves: II: Visual fields grossly normal,  III,IV, VI: ptosis not present, extra-ocular motions intact bilaterally, pupils equal, round, reactive to light and accommodation V,VII: smile symmetric, facial light touch sensation normal bilaterally VIII: hearing normal bilaterally IX,X: uvula rises symmetrically XI: bilateral shoulder shrug XII: midline tongue extension Motor: Right : Upper extremity   4/5    Left:     Upper extremity   5/5  Lower extremity   4/5     Lower extremity   5/5 Tone and bulk:normal tone throughout; no atrophy noted Sensory: Pinprick and light touch intact throughout, bilaterally Deep Tendon Reflexes: 2+ and symmetric throughout Plantars: Right: downgoing   Left: downgoing Cerebellar: normal finger-to-nose, normal rapid alternating movements and normal heel-to-shin test Gait: normal gait and station     Lab  Results: Basic Metabolic Panel: Recent Labs  Lab 07/08/17 0842 07/08/17 0847  NA 139 144  K 4.0 3.7  CL 108 108  CO2 22  --   GLUCOSE 207* 210*  BUN 27* 30*  CREATININE 2.32* 2.30*  CALCIUM 8.6*  --     CBC: Recent Labs  Lab 07/08/17 0842 07/08/17 0847  WBC 7.7  --   NEUTROABS 4.7  --   HGB 11.5* 12.6*  HCT 34.0* 37.0*  MCV 78.2  --   PLT 236  --     Coagulation Studies: Recent Labs    07/08/17 0842  LABPROT 14.0  INR 1.09    Imaging: Ct Angio Head W Or Wo Contrast  Result Date: 07/08/2017 CLINICAL DATA:  Expressive aphasia and RIGHT-sided weakness, improving. EXAM: CT ANGIOGRAPHY HEAD AND NECK TECHNIQUE: Multidetector CT imaging of the head and neck was performed using the standard protocol during bolus administration of intravenous contrast. Multiplanar CT image reconstructions and MIPs were obtained to evaluate the vascular anatomy. Carotid stenosis measurements (when applicable) are obtained utilizing NASCET criteria, using the distal internal carotid diameter as the denominator. CONTRAST:  50 mL ISOVUE-370 IOPAMIDOL (ISOVUE-370) INJECTION 76% COMPARISON:  CT perfusion reported separately. Noncontrast CT head reported earlier. FINDINGS: CTA NECK Aortic arch: Standard branching. Imaged portion shows no evidence of aneurysm or dissection. No significant stenosis of the major arch vessel origins. Right carotid system: Calcified and noncalcified plaque at the bifurcation. No evidence of dissection, stenosis (50% or greater) or occlusion. Left carotid system: Calcified and particularly noncalcified plaque at the bifurcation. Lipid laden soft plaque extends from the  bifurcation nearly 2 cm into cervical ICA, see sagittal image 148 series 14. No evidence of frank dissection, stenosis (50% or greater) or occlusion. Vertebral arteries: BILATERAL patent, LEFT slightly larger. No evidence of dissection, stenosis (50% or greater) or occlusion. Nonvascular soft tissues:   Noncontributory. CTA HEAD Anterior circulation: The skullbase, cavernous, and supraclinoid ICA segments appear widely patent. There is moderate irregularity of the A1 ACA on the LEFT, non worrisome given the dominant RIGHT A1 ACA. No MCA stenosis or occlusion. No significant stenosis, proximal occlusion, aneurysm, or vascular malformation. Posterior circulation: Both vertebral arteries contribute to basilar formation, LEFT dominant. Focal narrowing RIGHT P1 PCA estimated 50%. No significant stenosis, proximal occlusion, aneurysm, or vascular malformation. Venous sinuses: As permitted by contrast timing, patent. Anatomic variants: None of significance. Delayed phase:  Not performed. Review of the MIP images confirms the above findings. IMPRESSION: No extracranial flow reducing stenosis, but lipid laden plaque does extend for almost 2 cm into the LEFT ICA, areas concerning as a source of recurrent/distal emboli. See discussion above. Findings discussed with ordering provider. Non stenotic calcified and non calcified plaque at both bifurcations. No intracranial large vessel occlusion or significant proximal intracranial anterior circulation stenosis. Electronically Signed   By: Staci Righter M.D.   On: 07/08/2017 09:58   Ct Angio Neck W Or Wo Contrast  Result Date: 07/08/2017 CLINICAL DATA:  Expressive aphasia and RIGHT-sided weakness, improving. EXAM: CT ANGIOGRAPHY HEAD AND NECK TECHNIQUE: Multidetector CT imaging of the head and neck was performed using the standard protocol during bolus administration of intravenous contrast. Multiplanar CT image reconstructions and MIPs were obtained to evaluate the vascular anatomy. Carotid stenosis measurements (when applicable) are obtained utilizing NASCET criteria, using the distal internal carotid diameter as the denominator. CONTRAST:  50 mL ISOVUE-370 IOPAMIDOL (ISOVUE-370) INJECTION 76% COMPARISON:  CT perfusion reported separately. Noncontrast CT head reported  earlier. FINDINGS: CTA NECK Aortic arch: Standard branching. Imaged portion shows no evidence of aneurysm or dissection. No significant stenosis of the major arch vessel origins. Right carotid system: Calcified and noncalcified plaque at the bifurcation. No evidence of dissection, stenosis (50% or greater) or occlusion. Left carotid system: Calcified and particularly noncalcified plaque at the bifurcation. Lipid laden soft plaque extends from the bifurcation nearly 2 cm into cervical ICA, see sagittal image 148 series 14. No evidence of frank dissection, stenosis (50% or greater) or occlusion. Vertebral arteries: BILATERAL patent, LEFT slightly larger. No evidence of dissection, stenosis (50% or greater) or occlusion. Nonvascular soft tissues:  Noncontributory. CTA HEAD Anterior circulation: The skullbase, cavernous, and supraclinoid ICA segments appear widely patent. There is moderate irregularity of the A1 ACA on the LEFT, non worrisome given the dominant RIGHT A1 ACA. No MCA stenosis or occlusion. No significant stenosis, proximal occlusion, aneurysm, or vascular malformation. Posterior circulation: Both vertebral arteries contribute to basilar formation, LEFT dominant. Focal narrowing RIGHT P1 PCA estimated 50%. No significant stenosis, proximal occlusion, aneurysm, or vascular malformation. Venous sinuses: As permitted by contrast timing, patent. Anatomic variants: None of significance. Delayed phase:  Not performed. Review of the MIP images confirms the above findings. IMPRESSION: No extracranial flow reducing stenosis, but lipid laden plaque does extend for almost 2 cm into the LEFT ICA, areas concerning as a source of recurrent/distal emboli. See discussion above. Findings discussed with ordering provider. Non stenotic calcified and non calcified plaque at both bifurcations. No intracranial large vessel occlusion or significant proximal intracranial anterior circulation stenosis. Electronically Signed   By:  Roderic Ovens.D.  On: 07/08/2017 09:58   Mr Brain Wo Contrast  Result Date: 07/08/2017 CLINICAL DATA:  Confusion with expressive aphasia. This began earlier today. EXAM: MRI HEAD WITHOUT CONTRAST TECHNIQUE: Multiplanar, multiecho pulse sequences of the brain and surrounding structures were obtained without intravenous contrast. COMPARISON:  CT head without contrast, CT perfusion, and CTA head neck, all performed earlier today. FINDINGS: The patient was unable to remain motionless for the exam. Small or subtle lesions could be overlooked. Brain: Multifocal areas of restricted diffusion, corresponding low ADC, affect the LEFT hemisphere within the LEFT MCA territory. Largest confluent area involves the LEFT frontal cortex and subcortical white matter. Additional smaller areas are seen in the LEFT anterior frontal cortex, LEFT posterior frontal cortex, LEFT superior insular ribbon, and LEFT periventricular white matter. No hemorrhage, mass lesion, hydrocephalus, or extra-axial fluid. Vascular: Flow voids are maintained.  No large vessel occlusion. Skull and upper cervical spine: Normal marrow signal. Sinuses/Orbits: Grossly negative. Other: None. IMPRESSION: Multifocal areas of restricted diffusion consistent with acute nonhemorrhagic infarction affecting the LEFT MCA territory. Largest confluent area affects the LEFT frontal cortex and subcortical white matter.Shower of emboli is suspected. Electronically Signed   By: Staci Righter M.D.   On: 07/08/2017 14:49   Ct Cerebral Perfusion W Contrast  Result Date: 07/08/2017 CLINICAL DATA:  Code stroke. Focal neuro deficit less than 6 hours. Transient ischemic attack versus hypertensive encephalopathy. Stroke risk factors include hypertension and diabetes. Expressive aphasia and RIGHT-sided weakness, improving exam. Initial systolic blood pressure to 40 is improving. EXAM: CT PERFUSION BRAIN TECHNIQUE: Multiphase CT imaging of the brain was performed following IV  bolus contrast injection. Subsequent parametric perfusion maps were calculated using RAPID software. CONTRAST:  50 mL ISOVUE-370 IOPAMIDOL (ISOVUE-370) INJECTION 76% COMPARISON:  Code stroke CT earlier in the day FINDINGS: CT Brain Perfusion Findings: CBF (<30%) Volume: 37mL Perfusion (Tmax>6.0s) volume: 13mL Mismatch Volume: 59mL Mismatch location:  LEFT posterior frontal cortex. IMPRESSION: No core infarct is demonstrated. CBF (< 30%) volume: 0 mL. There is a small area of perfusion mismatch LEFT posterior frontal cortex. Electronically Signed   By: Staci Righter M.D.   On: 07/08/2017 09:36   Ct Head Code Stroke Wo Contrast  Result Date: 07/08/2017 CLINICAL DATA:  Code stroke.  RIGHT-sided weakness with  aphasia. EXAM: CT HEAD WITHOUT CONTRAST TECHNIQUE: Contiguous axial images were obtained from the base of the skull through the vertex without intravenous contrast. COMPARISON:  None. FINDINGS: Brain: No evidence of acute infarction, hemorrhage, hydrocephalus, extra-axial collection or mass lesion/mass effect. Normal for age cerebral volume. Mild hypoattenuation of the white matter, possible small vessel disease. Vascular: No hyperdense vessel or unexpected calcification. Skull: Normal. Negative for fracture or focal lesion. Sinuses/Orbits: No acute finding. Other: None. ASPECTS [facility] Stroke Program Early CT Score) - Ganglionic level infarction (caudate, lentiform nuclei, internal capsule, insula, M1-M3 cortex): 7 - Supraganglionic infarction (M4-M6 cortex): 3 Total score (0-10 with 10 being normal): 10 IMPRESSION: 1. Negative exam 2. ASPECTS is 10. These results were communicated to Dr. Lorraine Lax At 8:59 amon 1/5/2019by text page via the [facility] messaging system. Electronically Signed   By: Staci Righter M.D.   On: 07/08/2017 09:02     ASSESSMENT AND PLAN  22 y M with PMH of HTN, DM, CKD present with confusion, expressive aphasia, right side weakness - exam has been  Improving but still has mild aphasia. BP  elevated at 973 systolic by EMS, 532 Systolic in ER. CT Head negative for hemorrhage and CTA negative for LVO,  however complex soft plaque noted in left ICA. CTP shows no perfusion deficit. Outside window for tPA.   Acute ischemic stroke  Recommend # MRI of the brain without contrast #Transthoracic Echo  # Start patient on ASA 325mg  daily #Start or continue Atorvastatin 80 mg/other high intensity statin # BP goal: permissive 102/585 systolic,   # HBAIC and Lipid profile # Telemetry monitoring # Frequent neuro checks # NPO until passes stroke swallow screen  Left ICA complex (partially calcified/soft ) plaque Vascular surgery consult.    CKD Patient received IV contrast for CTA/CTP due to initial exam concerning for left MCA stroke.  Please start IV fluids and monitor for AKI.    Eithan Beagle [facility] Pager Number [phone]

## 2017-07-08 NOTE — ED Provider Notes (Signed)
[facility] EMERGENCY DEPARTMENT Provider Note   CSN: [id number] Arrival date & time: 07/08/17  9628     History   Chief Complaint Chief Complaint  Patient presents with  . Code Stroke    HPI [name] [name] is a 72 y.o. male.  Patient is a 72 year old male with a history of diabetes, hypertension and hyperlipidemia who presents as a code stroke.  Per EMS, he was last seen normal at midnight.  He woke up this morning with right-sided weakness and aphasia.  He also is noted to have some right-sided neglect and was LVO positive.  Code stroke was initiated by EMS.      Past Medical History:  Diagnosis Date  . DIABETES MELLITUS, TYPE II 01/02/2007  . HYPERLIPIDEMIA 06/18/2007  . HYPERTENSION 01/02/2007  . RENAL INSUFFICIENCY 02/05/2008    Patient Active Problem List   Diagnosis Date Noted  . Gout 07/21/2011  . Prostatitis, chronic 07/14/2010  . RENAL INSUFFICIENCY 02/05/2008  . Hyperlipidemia 06/18/2007  . DM type 2 (diabetes mellitus, type 2) ([place]) 01/02/2007  . Hypertensive kidney disease 01/02/2007    Past Surgical History:  Procedure Laterality Date  . SHOULDER SURGERY     left       Home Medications    Prior to Admission medications   Medication Sig Start Date End Date Taking? Authorizing Provider  amLODipine (NORVASC) 10 MG tablet Take 1 tablet (10 mg total) by mouth daily. 06/19/17   Nafziger, [name], NP  benazepril (LOTENSIN) 40 MG tablet Take 1 tablet (40 mg total) by mouth 2 (two) times daily. 06/19/17   Nafziger, [name], NP  calcitRIOL (ROCALTROL) 0.25 MCG capsule Take 1 capsule by mouth daily. 03/19/13   [provider]  cyclobenzaprine (FLEXERIL) 10 MG tablet Take 1 tablet (10 mg total) by mouth 3 (three) times daily as needed for muscle spasms. 08/16/16   Nafziger, [name], NP  fluvastatin (LESCOL) 40 MG capsule Take 1 capsule (40 mg total) by mouth at bedtime. 06/03/15   Nafziger, [name], NP  furosemide (LASIX) 80 MG tablet 160 mg po q  a.m and 80 mg po q afternoon 06/24/13   Swords, Darrick Penna, MD  glucose blood (ONETOUCH VERIO) test strip 1 each by Other route daily. Use as instructed 06/24/13   Swords, Darrick Penna, MD  hydrALAZINE (APRESOLINE) 25 MG tablet Take 1 tablet (25 mg total) by mouth 3 (three) times daily. 06/19/17   Nafziger, [name], NP  insulin aspart (NOVOLOG) 100 UNIT/ML injection Take 6 units with every meal unless blood sugar above 200 than take 10 units Patient taking differently: Inject 12 Units into the skin 3 (three) times daily with meals. Take 6 units with every meal unless blood sugar above 200 than take 10 units 09/28/12   Swords, Darrick Penna, MD  Insulin Glargine (LANTUS SOLOSTAR) 100 UNIT/ML Solostar Pen INJECT 20 UNITS SUBCUTANEOUSLY ONCE DAILY. NEED TO MAKE APPOINTMENT. Patient taking differently: Inject 25 Units into the skin daily at 10 pm. INJECT 20 UNITS SUBCUTANEOUSLY ONCE DAILY. NEED TO MAKE APPOINTMENT. 06/02/17   Colin Benton R, DO  Insulin Pen Needle (B-D UF III MINI PEN NEEDLES) 31G X 5 MM MISC USE AS DIRECTED 04/20/16   Nafziger, [name], NP  labetalol (NORMODYNE) 200 MG tablet Take 1 tablet (200 mg total) by mouth 2 (two) times daily. 07/28/14   Swords, Darrick Penna, MD  nateglinide (STARLIX) 120 MG tablet Take 1 tablet (120 mg total) by mouth 3 (three) times daily with meals. 06/03/15  Nafziger, [name], NP  potassium chloride SA (K-DUR,KLOR-CON) 20 MEQ tablet Take 1 tablet by mouth daily. 04/19/13   [provider]    Family History Family History  Problem Relation Age of Onset  . Hypertension Mother   . Diabetes Mother   . Stroke Father   . Stroke Maternal Grandmother     Social History Social History   Tobacco Use  . Smoking status: Never Smoker  . Smokeless tobacco: Never Used  Substance Use Topics  . Alcohol use: No  . Drug use: No     Allergies   Bactrim [sulfamethoxazole-trimethoprim]; Penicillins; Statins; and Sulfa drugs cross reactors   Review of Systems Review of Systems    Unable to perform ROS: Mental status change     Physical Exam Updated Vital Signs BP (!) 190/87   Pulse 72   Temp 98.7 F (37.1 C)   Resp 20   Ht 6\' 3"  (1.905 m)   Wt 111.1 kg (245 lb)   SpO2 100%   BMI 30.62 kg/m   Physical Exam  Constitutional: He appears well-developed and well-nourished.  HENT:  Head: Normocephalic and atraumatic.  Eyes: Pupils are equal, round, and reactive to light.  Neck: Normal range of motion. Neck supple.  Cardiovascular: Normal rate, regular rhythm and normal heart sounds.  Pulmonary/Chest: Effort normal and breath sounds normal. No respiratory distress. He has no wheezes. He has no rales. He exhibits no tenderness.  Abdominal: Soft. Bowel sounds are normal. There is no tenderness. There is no rebound and no guarding.  Musculoskeletal: Normal range of motion. He exhibits no edema.  Lymphadenopathy:    He has no cervical adenopathy.  Neurological: He is alert.  Patient is able to verbalize his name.  He has right-sided weakness with right side arm drip.  He has some mild right-sided facial drooping.  He appears to have an expressive aphasia but it is able to answer some simple questions.  Skin: Skin is warm and dry. No rash noted.  Psychiatric: He has a normal mood and affect.     ED Treatments / Results  Labs (all labs ordered are listed, but only abnormal results are displayed) Labs Reviewed  CBC - Abnormal; Notable for the following components:      Result Value   Hemoglobin 11.5 (*)    HCT 34.0 (*)    All other components within normal limits  COMPREHENSIVE METABOLIC PANEL - Abnormal; Notable for the following components:   Glucose, Bld 207 (*)    BUN 27 (*)    Creatinine, Ser 2.32 (*)    Calcium 8.6 (*)    ALT 14 (*)    GFR calc non Af Amer 27 (*)    GFR calc Af Amer 31 (*)    All other components within normal limits  CBG MONITORING, ED - Abnormal; Notable for the following components:   Glucose-Capillary 202 (*)    All other  components within normal limits  I-STAT CHEM 8, ED - Abnormal; Notable for the following components:   BUN 30 (*)    Creatinine, Ser 2.30 (*)    Glucose, Bld 210 (*)    Hemoglobin 12.6 (*)    HCT 37.0 (*)    All other components within normal limits  PROTIME-INR  APTT  DIFFERENTIAL  I-STAT TROPONIN, ED    EKG  EKG Interpretation  Date/Time:  Saturday July 08 2017 09:27:25 EST Ventricular Rate:  84 PR Interval:    QRS Duration: 106 QT Interval:  396 QTC Calculation: 469 R Axis:   76 Text Interpretation:  Sinus rhythm Probable left atrial enlargement Anterior infarct, old Minimal ST depression, inferior leads Baseline wander in lead(s) II III aVF Confirmed by Malvin Johns [phone]) on 07/08/2017 9:48:39 AM       Radiology Ct Angio Head W Or Wo Contrast  Result Date: 07/08/2017 CLINICAL DATA:  Expressive aphasia and RIGHT-sided weakness, improving. EXAM: CT ANGIOGRAPHY HEAD AND NECK TECHNIQUE: Multidetector CT imaging of the head and neck was performed using the standard protocol during bolus administration of intravenous contrast. Multiplanar CT image reconstructions and MIPs were obtained to evaluate the vascular anatomy. Carotid stenosis measurements (when applicable) are obtained utilizing NASCET criteria, using the distal internal carotid diameter as the denominator. CONTRAST:  50 mL ISOVUE-370 IOPAMIDOL (ISOVUE-370) INJECTION 76% COMPARISON:  CT perfusion reported separately. Noncontrast CT head reported earlier. FINDINGS: CTA NECK Aortic arch: Standard branching. Imaged portion shows no evidence of aneurysm or dissection. No significant stenosis of the major arch vessel origins. Right carotid system: Calcified and noncalcified plaque at the bifurcation. No evidence of dissection, stenosis (50% or greater) or occlusion. Left carotid system: Calcified and particularly noncalcified plaque at the bifurcation. Lipid laden soft plaque extends from the bifurcation nearly 2 cm into  cervical ICA, see sagittal image 148 series 14. No evidence of frank dissection, stenosis (50% or greater) or occlusion. Vertebral arteries: BILATERAL patent, LEFT slightly larger. No evidence of dissection, stenosis (50% or greater) or occlusion. Nonvascular soft tissues:  Noncontributory. CTA HEAD Anterior circulation: The skullbase, cavernous, and supraclinoid ICA segments appear widely patent. There is moderate irregularity of the A1 ACA on the LEFT, non worrisome given the dominant RIGHT A1 ACA. No MCA stenosis or occlusion. No significant stenosis, proximal occlusion, aneurysm, or vascular malformation. Posterior circulation: Both vertebral arteries contribute to basilar formation, LEFT dominant. Focal narrowing RIGHT P1 PCA estimated 50%. No significant stenosis, proximal occlusion, aneurysm, or vascular malformation. Venous sinuses: As permitted by contrast timing, patent. Anatomic variants: None of significance. Delayed phase:  Not performed. Review of the MIP images confirms the above findings. IMPRESSION: No extracranial flow reducing stenosis, but lipid laden plaque does extend for almost 2 cm into the LEFT ICA, areas concerning as a source of recurrent/distal emboli. See discussion above. Findings discussed with ordering provider. Non stenotic calcified and non calcified plaque at both bifurcations. No intracranial large vessel occlusion or significant proximal intracranial anterior circulation stenosis. Electronically Signed   By: Staci Righter M.D.   On: 07/08/2017 09:58   Ct Angio Neck W Or Wo Contrast  Result Date: 07/08/2017 CLINICAL DATA:  Expressive aphasia and RIGHT-sided weakness, improving. EXAM: CT ANGIOGRAPHY HEAD AND NECK TECHNIQUE: Multidetector CT imaging of the head and neck was performed using the standard protocol during bolus administration of intravenous contrast. Multiplanar CT image reconstructions and MIPs were obtained to evaluate the vascular anatomy. Carotid stenosis  measurements (when applicable) are obtained utilizing NASCET criteria, using the distal internal carotid diameter as the denominator. CONTRAST:  50 mL ISOVUE-370 IOPAMIDOL (ISOVUE-370) INJECTION 76% COMPARISON:  CT perfusion reported separately. Noncontrast CT head reported earlier. FINDINGS: CTA NECK Aortic arch: Standard branching. Imaged portion shows no evidence of aneurysm or dissection. No significant stenosis of the major arch vessel origins. Right carotid system: Calcified and noncalcified plaque at the bifurcation. No evidence of dissection, stenosis (50% or greater) or occlusion. Left carotid system: Calcified and particularly noncalcified plaque at the bifurcation. Lipid laden soft plaque extends from the bifurcation nearly 2 cm  into cervical ICA, see sagittal image 148 series 14. No evidence of frank dissection, stenosis (50% or greater) or occlusion. Vertebral arteries: BILATERAL patent, LEFT slightly larger. No evidence of dissection, stenosis (50% or greater) or occlusion. Nonvascular soft tissues:  Noncontributory. CTA HEAD Anterior circulation: The skullbase, cavernous, and supraclinoid ICA segments appear widely patent. There is moderate irregularity of the A1 ACA on the LEFT, non worrisome given the dominant RIGHT A1 ACA. No MCA stenosis or occlusion. No significant stenosis, proximal occlusion, aneurysm, or vascular malformation. Posterior circulation: Both vertebral arteries contribute to basilar formation, LEFT dominant. Focal narrowing RIGHT P1 PCA estimated 50%. No significant stenosis, proximal occlusion, aneurysm, or vascular malformation. Venous sinuses: As permitted by contrast timing, patent. Anatomic variants: None of significance. Delayed phase:  Not performed. Review of the MIP images confirms the above findings. IMPRESSION: No extracranial flow reducing stenosis, but lipid laden plaque does extend for almost 2 cm into the LEFT ICA, areas concerning as a source of recurrent/distal  emboli. See discussion above. Findings discussed with ordering provider. Non stenotic calcified and non calcified plaque at both bifurcations. No intracranial large vessel occlusion or significant proximal intracranial anterior circulation stenosis. Electronically Signed   By: Staci Righter M.D.   On: 07/08/2017 09:58   Ct Cerebral Perfusion W Contrast  Result Date: 07/08/2017 CLINICAL DATA:  Code stroke. Focal neuro deficit less than 6 hours. Transient ischemic attack versus hypertensive encephalopathy. Stroke risk factors include hypertension and diabetes. Expressive aphasia and RIGHT-sided weakness, improving exam. Initial systolic blood pressure to 40 is improving. EXAM: CT PERFUSION BRAIN TECHNIQUE: Multiphase CT imaging of the brain was performed following IV bolus contrast injection. Subsequent parametric perfusion maps were calculated using RAPID software. CONTRAST:  50 mL ISOVUE-370 IOPAMIDOL (ISOVUE-370) INJECTION 76% COMPARISON:  Code stroke CT earlier in the day FINDINGS: CT Brain Perfusion Findings: CBF (<30%) Volume: 71mL Perfusion (Tmax>6.0s) volume: 89mL Mismatch Volume: 33mL Mismatch location:  LEFT posterior frontal cortex. IMPRESSION: No core infarct is demonstrated. CBF (< 30%) volume: 0 mL. There is a small area of perfusion mismatch LEFT posterior frontal cortex. Electronically Signed   By: Staci Righter M.D.   On: 07/08/2017 09:36   Ct Head Code Stroke Wo Contrast  Result Date: 07/08/2017 CLINICAL DATA:  Code stroke.  RIGHT-sided weakness with  aphasia. EXAM: CT HEAD WITHOUT CONTRAST TECHNIQUE: Contiguous axial images were obtained from the base of the skull through the vertex without intravenous contrast. COMPARISON:  None. FINDINGS: Brain: No evidence of acute infarction, hemorrhage, hydrocephalus, extra-axial collection or mass lesion/mass effect. Normal for age cerebral volume. Mild hypoattenuation of the white matter, possible small vessel disease. Vascular: No hyperdense vessel or  unexpected calcification. Skull: Normal. Negative for fracture or focal lesion. Sinuses/Orbits: No acute finding. Other: None. ASPECTS [facility] Stroke Program Early CT Score) - Ganglionic level infarction (caudate, lentiform nuclei, internal capsule, insula, M1-M3 cortex): 7 - Supraganglionic infarction (M4-M6 cortex): 3 Total score (0-10 with 10 being normal): 10 IMPRESSION: 1. Negative exam 2. ASPECTS is 10. These results were communicated to Dr. Lorraine Lax At 8:59 amon 1/5/2019by text page via the [facility] messaging system. Electronically Signed   By: Staci Righter M.D.   On: 07/08/2017 09:02    Procedures Procedures (including critical care time)  Medications Ordered in ED Medications  iopamidol (ISOVUE-370) 76 % injection (100 mLs  Contrast Given 07/08/17 0858)     Initial Impression / Assessment and Plan / ED Course  I have reviewed the triage vital signs and the nursing  notes.  Pertinent labs & imaging results that were available during my care of the patient were reviewed by me and considered in my medical decision making (see chart for details).     Patient presents with acute stroke symptoms.  He is out of the time window for TPA.  There is no evidence of a large vessel occlusion on imaging studies.  He will be admitted for further stroke evaluation.  He has been seen by Dr. Lorraine Lax with neurology.  He does see what appears to be a large plaque in the carotid artery which she feels is likely the etiology for an ischemic stroke.  He recommends keeping the blood pressure under 993 systolic.  I spoke with Dr. Reesa Chew with the hospitalist service to admit the patient for further treatment.  Final Clinical Impressions(s) / ED Diagnoses   Final diagnoses:  Acute ischemic stroke ([place])  Hypertension, unspecified type    ED Discharge Orders    None       Malvin Johns, MD 07/08/17 1003

## 2017-07-08 NOTE — Progress Notes (Signed)
   07/08/17 1300  Clinical Encounter Type  Visited With Patient and family together  Visit Type ED   Visited with patient and spouse.  Awaiting further test, but provided comfort and offered hospitality.  Will follow as needed. Chaplain Katherene Ponto

## 2017-07-08 NOTE — ED Notes (Signed)
Neurologist aware of BP.

## 2017-07-08 NOTE — ED Notes (Signed)
CBG 172

## 2017-07-09 ENCOUNTER — Inpatient Hospital Stay (HOSPITAL_COMMUNITY): Payer: Medicare Other

## 2017-07-09 DIAGNOSIS — R29704 NIHSS score 4: Secondary | ICD-10-CM | POA: Diagnosis present

## 2017-07-09 DIAGNOSIS — I160 Hypertensive urgency: Secondary | ICD-10-CM | POA: Diagnosis not present

## 2017-07-09 DIAGNOSIS — I63512 Cerebral infarction due to unspecified occlusion or stenosis of left middle cerebral artery: Secondary | ICD-10-CM

## 2017-07-09 DIAGNOSIS — I639 Cerebral infarction, unspecified: Secondary | ICD-10-CM

## 2017-07-09 DIAGNOSIS — E1122 Type 2 diabetes mellitus with diabetic chronic kidney disease: Secondary | ICD-10-CM | POA: Diagnosis present

## 2017-07-09 DIAGNOSIS — I10 Essential (primary) hypertension: Secondary | ICD-10-CM | POA: Diagnosis not present

## 2017-07-09 DIAGNOSIS — I69351 Hemiplegia and hemiparesis following cerebral infarction affecting right dominant side: Secondary | ICD-10-CM | POA: Diagnosis not present

## 2017-07-09 DIAGNOSIS — Z888 Allergy status to other drugs, medicaments and biological substances status: Secondary | ICD-10-CM | POA: Diagnosis not present

## 2017-07-09 DIAGNOSIS — D62 Acute posthemorrhagic anemia: Secondary | ICD-10-CM | POA: Diagnosis not present

## 2017-07-09 DIAGNOSIS — E7800 Pure hypercholesterolemia, unspecified: Secondary | ICD-10-CM

## 2017-07-09 DIAGNOSIS — Z880 Allergy status to penicillin: Secondary | ICD-10-CM | POA: Diagnosis not present

## 2017-07-09 DIAGNOSIS — R482 Apraxia: Secondary | ICD-10-CM | POA: Diagnosis present

## 2017-07-09 DIAGNOSIS — E785 Hyperlipidemia, unspecified: Secondary | ICD-10-CM | POA: Diagnosis present

## 2017-07-09 DIAGNOSIS — E669 Obesity, unspecified: Secondary | ICD-10-CM | POA: Diagnosis present

## 2017-07-09 DIAGNOSIS — Z823 Family history of stroke: Secondary | ICD-10-CM | POA: Diagnosis not present

## 2017-07-09 DIAGNOSIS — Z833 Family history of diabetes mellitus: Secondary | ICD-10-CM | POA: Diagnosis not present

## 2017-07-09 DIAGNOSIS — Z794 Long term (current) use of insulin: Secondary | ICD-10-CM | POA: Diagnosis not present

## 2017-07-09 DIAGNOSIS — Z881 Allergy status to other antibiotic agents status: Secondary | ICD-10-CM | POA: Diagnosis not present

## 2017-07-09 DIAGNOSIS — M109 Gout, unspecified: Secondary | ICD-10-CM | POA: Diagnosis present

## 2017-07-09 DIAGNOSIS — E876 Hypokalemia: Secondary | ICD-10-CM | POA: Diagnosis present

## 2017-07-09 DIAGNOSIS — I6389 Other cerebral infarction: Secondary | ICD-10-CM | POA: Diagnosis not present

## 2017-07-09 DIAGNOSIS — Z8249 Family history of ischemic heart disease and other diseases of the circulatory system: Secondary | ICD-10-CM | POA: Diagnosis not present

## 2017-07-09 DIAGNOSIS — I169 Hypertensive crisis, unspecified: Secondary | ICD-10-CM | POA: Diagnosis not present

## 2017-07-09 DIAGNOSIS — E162 Hypoglycemia, unspecified: Secondary | ICD-10-CM | POA: Diagnosis not present

## 2017-07-09 DIAGNOSIS — I361 Nonrheumatic tricuspid (valve) insufficiency: Secondary | ICD-10-CM | POA: Diagnosis not present

## 2017-07-09 DIAGNOSIS — Z6830 Body mass index (BMI) 30.0-30.9, adult: Secondary | ICD-10-CM | POA: Diagnosis not present

## 2017-07-09 DIAGNOSIS — I63412 Cerebral infarction due to embolism of left middle cerebral artery: Secondary | ICD-10-CM | POA: Diagnosis present

## 2017-07-09 DIAGNOSIS — E1169 Type 2 diabetes mellitus with other specified complication: Secondary | ICD-10-CM | POA: Diagnosis not present

## 2017-07-09 DIAGNOSIS — R4701 Aphasia: Secondary | ICD-10-CM | POA: Diagnosis present

## 2017-07-09 DIAGNOSIS — R490 Dysphonia: Secondary | ICD-10-CM | POA: Diagnosis not present

## 2017-07-09 DIAGNOSIS — Z882 Allergy status to sulfonamides status: Secondary | ICD-10-CM | POA: Diagnosis not present

## 2017-07-09 DIAGNOSIS — I129 Hypertensive chronic kidney disease with stage 1 through stage 4 chronic kidney disease, or unspecified chronic kidney disease: Secondary | ICD-10-CM | POA: Diagnosis present

## 2017-07-09 DIAGNOSIS — N183 Chronic kidney disease, stage 3 (moderate): Secondary | ICD-10-CM | POA: Diagnosis present

## 2017-07-09 DIAGNOSIS — I161 Hypertensive emergency: Secondary | ICD-10-CM | POA: Diagnosis present

## 2017-07-09 DIAGNOSIS — E1121 Type 2 diabetes mellitus with diabetic nephropathy: Secondary | ICD-10-CM | POA: Diagnosis not present

## 2017-07-09 DIAGNOSIS — G8191 Hemiplegia, unspecified affecting right dominant side: Secondary | ICD-10-CM | POA: Diagnosis present

## 2017-07-09 LAB — GLUCOSE, CAPILLARY
GLUCOSE-CAPILLARY: 119 mg/dL — AB (ref 65–99)
GLUCOSE-CAPILLARY: 145 mg/dL — AB (ref 65–99)
GLUCOSE-CAPILLARY: 164 mg/dL — AB (ref 65–99)
Glucose-Capillary: 140 mg/dL — ABNORMAL HIGH (ref 65–99)
Glucose-Capillary: 114 mg/dL — ABNORMAL HIGH (ref 65–99)
Glucose-Capillary: 164 mg/dL — ABNORMAL HIGH (ref 65–99)

## 2017-07-09 LAB — LIPID PANEL
CHOL/HDL RATIO: 7.8 ratio
CHOLESTEROL: 279 mg/dL — AB (ref 0–200)
HDL: 36 mg/dL — AB (ref >40)
LDL Cholesterol: 212 mg/dL — ABNORMAL HIGH (ref 0–99)
Triglycerides: 156 mg/dL — ABNORMAL HIGH (ref <150)
VLDL: 31 mg/dL (ref 0–40)

## 2017-07-09 LAB — COMPREHENSIVE METABOLIC PANEL
ALBUMIN: 3.5 g/dL (ref 3.5–5.0)
ALK PHOS: 88 U/L (ref 38–126)
ALT: 14 U/L — ABNORMAL LOW (ref 17–63)
ANION GAP: 10 (ref 5–15)
AST: 19 U/L (ref 15–41)
BILIRUBIN TOTAL: 1 mg/dL (ref 0.3–1.2)
BUN: 23 mg/dL — ABNORMAL HIGH (ref 6–20)
CALCIUM: 8.8 mg/dL — AB (ref 8.9–10.3)
CO2: 20 mmol/L — ABNORMAL LOW (ref 22–32)
Chloride: 105 mmol/L (ref 101–111)
Creatinine, Ser: 2.21 mg/dL — ABNORMAL HIGH (ref 0.61–1.24)
GFR, EST AFRICAN AMERICAN: 33 mL/min — AB (ref >60)
GFR, EST NON AFRICAN AMERICAN: 28 mL/min — AB (ref >60)
Glucose, Bld: 146 mg/dL — ABNORMAL HIGH (ref 65–99)
POTASSIUM: 3 mmol/L — AB (ref 3.5–5.1)
Sodium: 135 mmol/L (ref 135–145)
TOTAL PROTEIN: 6.5 g/dL (ref 6.5–8.1)

## 2017-07-09 LAB — HEMOGLOBIN A1C
Hgb A1c MFr Bld: 8.1 % — ABNORMAL HIGH (ref 4.8–5.6)
MEAN PLASMA GLUCOSE: 185.77 mg/dL

## 2017-07-09 LAB — ECHOCARDIOGRAM COMPLETE
Height: 75 in
Weight: 3918.9 [oz_av]

## 2017-07-09 MED ORDER — POTASSIUM CHLORIDE CRYS ER 20 MEQ PO TBCR
40.0000 meq | EXTENDED_RELEASE_TABLET | Freq: Once | ORAL | Status: AC
  Administered 2017-07-09: 40 meq via ORAL
  Filled 2017-07-09: qty 2

## 2017-07-09 MED ORDER — ASPIRIN EC 325 MG PO TBEC
325.0000 mg | DELAYED_RELEASE_TABLET | Freq: Every day | ORAL | Status: DC
  Administered 2017-07-09 – 2017-07-11 (×3): 325 mg via ORAL
  Filled 2017-07-09 (×3): qty 1

## 2017-07-09 MED ORDER — EZETIMIBE 10 MG PO TABS
10.0000 mg | ORAL_TABLET | Freq: Every day | ORAL | Status: DC
  Administered 2017-07-09 – 2017-07-11 (×3): 10 mg via ORAL
  Filled 2017-07-09 (×3): qty 1

## 2017-07-09 NOTE — Progress Notes (Signed)
PROGRESS NOTE    [name] [name]  VQM:[id number] DOB: 1945/12/31 DOA: 07/08/2017 PCP: Dorothyann Peng, NP   Brief Narrative:  72 year old male with history of diabetes mellitus type 2, CKD stage III, hypertension, hyperlipidemia came to the hospital with complains of confusion and expressive aphasia.  CT of the head and CT of the head and neck did not show any acute abnormality but MRI of the brain showed multifocal area of nonhemorrhagic infarction affecting left MCA territory   Assessment & Plan:   Active Problems:   Aphasia   Multifocal acute CVA and left MCA territory Mild confusion and expressive aphasia -CT of the head and CTA head and neck is negative but the MRI of the brain shows left MCA territory acute infarction, largest confluent area affects the left frontal cortex and subcortical white matter -Continue neuro checks, permissive hypertension -Carotid ultrasound and echocardiogram has been ordered -Continue telemetry monitoring, neurology following next hemoglobin A1c is 8.1 and LDL is 212 - Blood sugars fairly well controlled here, continue daily aspirin -He is allergic to statin, will use Zetia 10mg  po instead   Diabetes mellitus type 2-insulin-dependent -His blood sugars are relatively in a good range on current regimen of home Lantus -Hemoglobin A1c is 8.1, advised him to remain compliant with this  Essential hypertension -Currently holding p.o. medications due to permissive hypertension - Will treated with IV medication if his systolic is greater than 932 or diastolic greater than 671  Hyperlipidemia  -Elevated LDL, intolerant to statin due to muscle cramping.  Will use Zetia 10 mg daily  CKD stage III - Secondary to uncontrolled diabetes and hypertension -Continue gentle hydration, avoid nephrotoxic drugs and monitor his kidney functions closely   DVT prophylaxis: SCDs Code Status: Full code Family Communication: Wife and daughter at bedside Disposition  Plan: To be determined  It is my clinical opinion that admission to INPATIENT is reasonable and necessary in this 72 y.o. male . presenting with symptoms of aphasia and confusion, concerning for CVA . in the context of PMH including: Uncontrolled diabetes and hypertension . with pertinent positives on physical exam including: Aphasia and mild confusion . and pertinent positives on radiographic and laboratory data including: MRI suggestive of multifocal CVA . Workup and treatment include CVA workup as mentioned above  Given the aforementioned, the predictability of an adverse outcome is felt to be significant. I expect that the patient will require at least 2 midnights in the hospital to treat this condition.     Consultants:   Neuro  Procedures:   none  Antimicrobials:   None   Subjective: Patient states he feels slightly better but family reports that on and off he has been having some expressive aphasia.  Objective: Vitals:   07/09/17 0040 07/09/17 0153 07/09/17 0435 07/09/17 0922  BP: (!) 197/88 (!) 200/105 (!) 185/89 (!) 187/87  Pulse: 95  87 86  Resp: 18  18 18   Temp: 99.7 F (37.6 C)  98.4 F (36.9 C) 99.5 F (37.5 C)  TempSrc: Oral  Oral Oral  SpO2: 97%  95% 97%  Weight:      Height:        Intake/Output Summary (Last 24 hours) at 07/09/2017 0929 Last data filed at 07/09/2017 0800 Gross per 24 hour  Intake 120 ml  Output 1400 ml  Net -1280 ml   Filed Weights   07/08/17 0940 07/08/17 1436  Weight: 111.1 kg (245 lb) 111.1 kg (244 lb 14.9 oz)    Examination:  General exam: Appears calm and comfortable  Respiratory system: Clear to auscultation. Respiratory effort normal. Cardiovascular system: S1 & S2 heard, RRR. No JVD, murmurs, rubs, gallops or clicks. No pedal edema. Gastrointestinal system: Abdomen is nondistended, soft and nontender. No organomegaly or masses felt. Normal bowel sounds heard. Central nervous system: Alert and oriented.  Some  expressive aphasia Extremities: Symmetric 5 x 5 power. Skin: No rashes, lesions or ulcers Psychiatry: Judgement and insight appear normal. Mood & affect appropriate.     Data Reviewed:   CBC: Recent Labs  Lab 07/08/17 0842 07/08/17 0847  WBC 7.7  --   NEUTROABS 4.7  --   HGB 11.5* 12.6*  HCT 34.0* 37.0*  MCV 78.2  --   PLT 236  --    Basic Metabolic Panel: Recent Labs  Lab 07/08/17 0842 07/08/17 0847 07/09/17 0327  NA 139 144 135  K 4.0 3.7 3.0*  CL 108 108 105  CO2 22  --  20*  GLUCOSE 207* 210* 146*  BUN 27* 30* 23*  CREATININE 2.32* 2.30* 2.21*  CALCIUM 8.6*  --  8.8*   GFR: Estimated Creatinine Clearance: 41.8 mL/min (A) (by C-G formula based on SCr of 2.21 mg/dL (H)). Liver Function Tests: Recent Labs  Lab 07/08/17 0842 07/09/17 0327  AST 23 19  ALT 14* 14*  ALKPHOS 87 88  BILITOT 0.9 1.0  PROT 6.8 6.5  ALBUMIN 3.8 3.5   No results for input(s): LIPASE, AMYLASE in the last 168 hours. No results for input(s): AMMONIA in the last 168 hours. Coagulation Profile: Recent Labs  Lab 07/08/17 0842  INR 1.09   Cardiac Enzymes: No results for input(s): CKTOTAL, CKMB, CKMBINDEX, TROPONINI in the last 168 hours. BNP (last 3 results) No results for input(s): PROBNP in the last 8760 hours. HbA1C: Recent Labs    07/09/17 0327  HGBA1C 8.1*   CBG: Recent Labs  Lab 07/08/17 1607 07/08/17 2012 07/09/17 0035 07/09/17 0434 07/09/17 0719  GLUCAP 185* 194* 164* 140* 119*   Lipid Profile: Recent Labs    07/09/17 0327  CHOL 279*  HDL 36*  LDLCALC 212*  TRIG 156*  CHOLHDL 7.8   Thyroid Function Tests: No results for input(s): TSH, T4TOTAL, FREET4, T3FREE, THYROIDAB in the last 72 hours. Anemia Panel: No results for input(s): VITAMINB12, FOLATE, FERRITIN, TIBC, IRON, RETICCTPCT in the last 72 hours. Sepsis Labs: No results for input(s): PROCALCITON, LATICACIDVEN in the last 168 hours.  No results found for this or any previous visit (from the  past 240 hour(s)).       Radiology Studies: Ct Angio Head W Or Wo Contrast  Result Date: 07/08/2017 CLINICAL DATA:  Expressive aphasia and RIGHT-sided weakness, improving. EXAM: CT ANGIOGRAPHY HEAD AND NECK TECHNIQUE: Multidetector CT imaging of the head and neck was performed using the standard protocol during bolus administration of intravenous contrast. Multiplanar CT image reconstructions and MIPs were obtained to evaluate the vascular anatomy. Carotid stenosis measurements (when applicable) are obtained utilizing NASCET criteria, using the distal internal carotid diameter as the denominator. CONTRAST:  50 mL ISOVUE-370 IOPAMIDOL (ISOVUE-370) INJECTION 76% COMPARISON:  CT perfusion reported separately. Noncontrast CT head reported earlier. FINDINGS: CTA NECK Aortic arch: Standard branching. Imaged portion shows no evidence of aneurysm or dissection. No significant stenosis of the major arch vessel origins. Right carotid system: Calcified and noncalcified plaque at the bifurcation. No evidence of dissection, stenosis (50% or greater) or occlusion. Left carotid system: Calcified and particularly noncalcified plaque at the bifurcation. Lipid laden  soft plaque extends from the bifurcation nearly 2 cm into cervical ICA, see sagittal image 148 series 14. No evidence of frank dissection, stenosis (50% or greater) or occlusion. Vertebral arteries: BILATERAL patent, LEFT slightly larger. No evidence of dissection, stenosis (50% or greater) or occlusion. Nonvascular soft tissues:  Noncontributory. CTA HEAD Anterior circulation: The skullbase, cavernous, and supraclinoid ICA segments appear widely patent. There is moderate irregularity of the A1 ACA on the LEFT, non worrisome given the dominant RIGHT A1 ACA. No MCA stenosis or occlusion. No significant stenosis, proximal occlusion, aneurysm, or vascular malformation. Posterior circulation: Both vertebral arteries contribute to basilar formation, LEFT dominant.  Focal narrowing RIGHT P1 PCA estimated 50%. No significant stenosis, proximal occlusion, aneurysm, or vascular malformation. Venous sinuses: As permitted by contrast timing, patent. Anatomic variants: None of significance. Delayed phase:  Not performed. Review of the MIP images confirms the above findings. IMPRESSION: No extracranial flow reducing stenosis, but lipid laden plaque does extend for almost 2 cm into the LEFT ICA, areas concerning as a source of recurrent/distal emboli. See discussion above. Findings discussed with ordering provider. Non stenotic calcified and non calcified plaque at both bifurcations. No intracranial large vessel occlusion or significant proximal intracranial anterior circulation stenosis. Electronically Signed   By: Staci Righter M.D.   On: 07/08/2017 09:58   Ct Angio Neck W Or Wo Contrast  Result Date: 07/08/2017 CLINICAL DATA:  Expressive aphasia and RIGHT-sided weakness, improving. EXAM: CT ANGIOGRAPHY HEAD AND NECK TECHNIQUE: Multidetector CT imaging of the head and neck was performed using the standard protocol during bolus administration of intravenous contrast. Multiplanar CT image reconstructions and MIPs were obtained to evaluate the vascular anatomy. Carotid stenosis measurements (when applicable) are obtained utilizing NASCET criteria, using the distal internal carotid diameter as the denominator. CONTRAST:  50 mL ISOVUE-370 IOPAMIDOL (ISOVUE-370) INJECTION 76% COMPARISON:  CT perfusion reported separately. Noncontrast CT head reported earlier. FINDINGS: CTA NECK Aortic arch: Standard branching. Imaged portion shows no evidence of aneurysm or dissection. No significant stenosis of the major arch vessel origins. Right carotid system: Calcified and noncalcified plaque at the bifurcation. No evidence of dissection, stenosis (50% or greater) or occlusion. Left carotid system: Calcified and particularly noncalcified plaque at the bifurcation. Lipid laden soft plaque extends  from the bifurcation nearly 2 cm into cervical ICA, see sagittal image 148 series 14. No evidence of frank dissection, stenosis (50% or greater) or occlusion. Vertebral arteries: BILATERAL patent, LEFT slightly larger. No evidence of dissection, stenosis (50% or greater) or occlusion. Nonvascular soft tissues:  Noncontributory. CTA HEAD Anterior circulation: The skullbase, cavernous, and supraclinoid ICA segments appear widely patent. There is moderate irregularity of the A1 ACA on the LEFT, non worrisome given the dominant RIGHT A1 ACA. No MCA stenosis or occlusion. No significant stenosis, proximal occlusion, aneurysm, or vascular malformation. Posterior circulation: Both vertebral arteries contribute to basilar formation, LEFT dominant. Focal narrowing RIGHT P1 PCA estimated 50%. No significant stenosis, proximal occlusion, aneurysm, or vascular malformation. Venous sinuses: As permitted by contrast timing, patent. Anatomic variants: None of significance. Delayed phase:  Not performed. Review of the MIP images confirms the above findings. IMPRESSION: No extracranial flow reducing stenosis, but lipid laden plaque does extend for almost 2 cm into the LEFT ICA, areas concerning as a source of recurrent/distal emboli. See discussion above. Findings discussed with ordering provider. Non stenotic calcified and non calcified plaque at both bifurcations. No intracranial large vessel occlusion or significant proximal intracranial anterior circulation stenosis. Electronically Signed   By:  Staci Righter M.D.   On: 07/08/2017 09:58   Mr Brain Wo Contrast  Result Date: 07/08/2017 CLINICAL DATA:  Confusion with expressive aphasia. This began earlier today. EXAM: MRI HEAD WITHOUT CONTRAST TECHNIQUE: Multiplanar, multiecho pulse sequences of the brain and surrounding structures were obtained without intravenous contrast. COMPARISON:  CT head without contrast, CT perfusion, and CTA head neck, all performed earlier today.  FINDINGS: The patient was unable to remain motionless for the exam. Small or subtle lesions could be overlooked. Brain: Multifocal areas of restricted diffusion, corresponding low ADC, affect the LEFT hemisphere within the LEFT MCA territory. Largest confluent area involves the LEFT frontal cortex and subcortical white matter. Additional smaller areas are seen in the LEFT anterior frontal cortex, LEFT posterior frontal cortex, LEFT superior insular ribbon, and LEFT periventricular white matter. No hemorrhage, mass lesion, hydrocephalus, or extra-axial fluid. Vascular: Flow voids are maintained.  No large vessel occlusion. Skull and upper cervical spine: Normal marrow signal. Sinuses/Orbits: Grossly negative. Other: None. IMPRESSION: Multifocal areas of restricted diffusion consistent with acute nonhemorrhagic infarction affecting the LEFT MCA territory. Largest confluent area affects the LEFT frontal cortex and subcortical white matter.Shower of emboli is suspected. Electronically Signed   By: Staci Righter M.D.   On: 07/08/2017 14:49   Ct Cerebral Perfusion W Contrast  Result Date: 07/08/2017 CLINICAL DATA:  Code stroke. Focal neuro deficit less than 6 hours. Transient ischemic attack versus hypertensive encephalopathy. Stroke risk factors include hypertension and diabetes. Expressive aphasia and RIGHT-sided weakness, improving exam. Initial systolic blood pressure to 40 is improving. EXAM: CT PERFUSION BRAIN TECHNIQUE: Multiphase CT imaging of the brain was performed following IV bolus contrast injection. Subsequent parametric perfusion maps were calculated using RAPID software. CONTRAST:  50 mL ISOVUE-370 IOPAMIDOL (ISOVUE-370) INJECTION 76% COMPARISON:  Code stroke CT earlier in the day FINDINGS: CT Brain Perfusion Findings: CBF (<30%) Volume: 27mL Perfusion (Tmax>6.0s) volume: 58mL Mismatch Volume: 81mL Mismatch location:  LEFT posterior frontal cortex. IMPRESSION: No core infarct is demonstrated. CBF (< 30%)  volume: 0 mL. There is a small area of perfusion mismatch LEFT posterior frontal cortex. Electronically Signed   By: Staci Righter M.D.   On: 07/08/2017 09:36   Ct Head Code Stroke Wo Contrast  Result Date: 07/08/2017 CLINICAL DATA:  Code stroke.  RIGHT-sided weakness with  aphasia. EXAM: CT HEAD WITHOUT CONTRAST TECHNIQUE: Contiguous axial images were obtained from the base of the skull through the vertex without intravenous contrast. COMPARISON:  None. FINDINGS: Brain: No evidence of acute infarction, hemorrhage, hydrocephalus, extra-axial collection or mass lesion/mass effect. Normal for age cerebral volume. Mild hypoattenuation of the white matter, possible small vessel disease. Vascular: No hyperdense vessel or unexpected calcification. Skull: Normal. Negative for fracture or focal lesion. Sinuses/Orbits: No acute finding. Other: None. ASPECTS [facility] Stroke Program Early CT Score) - Ganglionic level infarction (caudate, lentiform nuclei, internal capsule, insula, M1-M3 cortex): 7 - Supraganglionic infarction (M4-M6 cortex): 3 Total score (0-10 with 10 being normal): 10 IMPRESSION: 1. Negative exam 2. ASPECTS is 10. These results were communicated to Dr. Lorraine Lax At 8:59 amon 1/5/2019by text page via the [facility] messaging system. Electronically Signed   By: Staci Righter M.D.   On: 07/08/2017 09:02        Scheduled Meds: . aspirin  325 mg Oral Once  . hydrALAZINE  25 mg Oral TID  . insulin glargine  25 Units Subcutaneous Q2200   Continuous Infusions: . sodium chloride 75 mL/hr at 07/09/17 0541  . dextrose 5 % and  0.45% NaCl       LOS: 0 days    Time spent: 30 mins     Maris Abascal Arsenio Loader, MD [facility] Pager [phone]   If 7PM-7AM, please contact night-coverage [email or web address] Password [email or web address] 07/09/2017, 9:29 AM

## 2017-07-09 NOTE — Progress Notes (Signed)
Carotid artery duplex has been completed. 1-39% ICA stenosis bilaterally.  07/09/17 4:01 PM [name] [name] RVT

## 2017-07-09 NOTE — Progress Notes (Signed)
STROKE TEAM PROGRESS NOTE   HISTORY OF PRESENT ILLNESS (per record) [name] [name] is an 72 y.o. male malewith medical history significant ofdiabetes mellitus type 2, CKD stage III, hypertension, hyperlipidemia presented as stroke alert with aphasia. LSN was 2 am according to wife.  When he woke up at 8am,he was confused, speaking only few words. After few hours, EMS was alerted. BP was 284 systolic per EMS. On arrival patient had aphasia and mild right side weakness.   Date last known well: 1.5.19 Time last known well: 2 am tPA Given: no, outside window NIHSS: 4 Baseline MRS 0     SUBJECTIVE (INTERVAL HISTORY) His wife and daughter are at bedside, discussed findings and answered all questions.   Home Medications:  Current Meds  Medication Sig  . amLODipine (NORVASC) 10 MG tablet Take 1 tablet (10 mg total) by mouth daily.  . benazepril (LOTENSIN) 40 MG tablet Take 1 tablet (40 mg total) by mouth 2 (two) times daily.  . furosemide (LASIX) 80 MG tablet 160 mg po q a.m and 80 mg po q afternoon  . hydrALAZINE (APRESOLINE) 25 MG tablet Take 1 tablet (25 mg total) by mouth 3 (three) times daily.  . insulin aspart (NOVOLOG) 100 UNIT/ML injection Take 6 units with every meal unless blood sugar above 200 than take 10 units (Patient taking differently: Inject 12 Units into the skin 3 (three) times daily with meals. )  . Insulin Glargine (LANTUS SOLOSTAR) 100 UNIT/ML Solostar Pen INJECT 20 UNITS SUBCUTANEOUSLY ONCE DAILY. NEED TO MAKE APPOINTMENT. (Patient taking differently: Inject 25 Units into the skin daily at 10 pm. )  . labetalol (NORMODYNE) 200 MG tablet Take 1 tablet (200 mg total) by mouth 2 (two) times daily.      Hospital Medications:  . aspirin  325 mg Oral Once  . hydrALAZINE  25 mg Oral TID  . insulin glargine  25 Units Subcutaneous Q2200  . potassium chloride  40 mEq Oral Once    OBJECTIVE Temp:  [98.4 F (36.9 C)-100.3 F (37.9 C)] 98.4 F (36.9 C) (01/06  0435) Pulse Rate:  [63-95] 87 (01/06 0435) Cardiac Rhythm: Normal sinus rhythm (01/06 0701) Resp:  [9-21] 18 (01/06 0435) BP: (170-226)/(77-110) 185/89 (01/06 0435) SpO2:  [95 %-100 %] 95 % (01/06 0435) Weight:  [244 lb 14.9 oz (111.1 kg)-245 lb (111.1 kg)] 244 lb 14.9 oz (111.1 kg) (01/05 1436)  CBC:  Recent Labs  Lab 07/08/17 0842 07/08/17 0847  WBC 7.7  --   NEUTROABS 4.7  --   HGB 11.5* 12.6*  HCT 34.0* 37.0*  MCV 78.2  --   PLT 236  --     Basic Metabolic Panel:  Recent Labs  Lab 07/08/17 0842 07/08/17 0847 07/09/17 0327  NA 139 144 135  K 4.0 3.7 3.0*  CL 108 108 105  CO2 22  --  20*  GLUCOSE 207* 210* 146*  BUN 27* 30* 23*  CREATININE 2.32* 2.30* 2.21*  CALCIUM 8.6*  --  8.8*    Lipid Panel:     Component Value Date/Time   CHOL 279 (H) 07/09/2017 0327   TRIG 156 (H) 07/09/2017 0327   TRIG 323 (HH) 06/13/2006 1050   HDL 36 (L) 07/09/2017 0327   CHOLHDL 7.8 07/09/2017 0327   VLDL 31 07/09/2017 0327   LDLCALC 212 (H) 07/09/2017 0327   HgbA1c:  Lab Results  Component Value Date   HGBA1C 8.1 (H) 07/09/2017   Urine Drug Screen: No results found for:  LABOPIA, COCAINSCRNUR, LABBENZ, AMPHETMU, THCU, LABBARB  Alcohol Level No results found for: ETH  IMAGING   Ct Angio Head W Or Wo Contrast Ct Angio Neck W Or Wo Contrast 07/08/2017 IMPRESSION:  No extracranial flow reducing stenosis, but lipid laden plaque does extend for almost 2 cm into the LEFT ICA, areas concerning as a source of recurrent/distal emboli. See discussion above. Findings discussed with ordering provider. Non stenotic calcified and non calcified plaque at both bifurcations. No intracranial large vessel occlusion or significant proximal intracranial anterior circulation stenosis.    Mr Brain Wo Contrast 07/08/2017 IMPRESSION: Multifocal areas of restricted diffusion consistent with acute nonhemorrhagic infarction affecting the LEFT MCA territory. Largest confluent area affects the LEFT  frontal cortex and subcortical white matter. Shower of emboli is suspected.   Ct Cerebral Perfusion W Contrast  07/08/2017 IMPRESSION:  No core infarct is demonstrated. CBF (< 30%) volume: 0 mL.  There is a small area of perfusion mismatch LEFT posterior frontal cortex.   Ct Head Code Stroke Wo Contrast 07/08/2017 IMPRESSION:  1. Negative exam  2. ASPECTS is 10.     Transthoracic Echocardiogram - pending 00/00/00    Bilateral Carotid Dopplers - pending 00/00/00     PHYSICAL EXAM Vitals:   07/08/17 2230 07/09/17 0040 07/09/17 0153 07/09/17 0435  BP: (!) 210/97 (!) 197/88 (!) 200/105 (!) 185/89  Pulse: 91 95  87  Resp: 18 18  18   Temp:  99.7 F (37.6 C)  98.4 F (36.9 C)  TempSrc:  Oral  Oral  SpO2: 99% 97%  95%  Weight:      Height:       PHYSICAL EXAM Physical exam: Exam: Gen: NAD Eyes: anicteric sclerae, moist conjunctivae                    CV: no MRG, no carotid bruits, no peripheral edema Mental Status: Alert, follows commands  Neuro: Detailed Neurologic Exam  Speech:    No aphasia, no dysarthria  Cranial Nerves:    The pupils are equal, round, and reactive to light.. Attempted, Fundi not visualized.  EOMI. Visual fields full. Face symmetric, Tongue midline. Hearing intact to voice. Shoulder shrug intact. Hearing intact to voice.   Motor Observation:    no involuntary movements noted. Tone appears normal.     Strength:    Right hemiparesis 4/5     Sensation:  Intact to LT  Plantars downgoing.    ASSESSMENT/PLAN [name] is a 72 y.o. male with history of chronic kidney disease, hypertension, hyperlipidemia, and diabetes mellitus presenting with aphasia, confusion, and mild right-sided weakness. He did not receive IV t-PA due to late presentation.  Stroke:  Left middle cerebral artery territory embolic infarcts - possibly from the left ICA.  Resultant  Mild right hemiparesis  CT head - Negative exam   MRI head - Multifocal  areas of restricted diffusion consistent with acute nonhemorrhagic infarction affecting the LEFT MCA territory  MRA head - not performed  CTA H&N -  lipid laden plaque does extend for almost 2 cm into the LEFT ICA.  Carotid Doppler - pending  2D Echo - pending  LDL - 212  HgbA1c - 8.1  VTE prophylaxis - SCDs  Diet Carb Modified Fluid consistency: Thin; Room service appropriate? Yes  No antithrombotic prior to admission, now on aspirin 325 mg daily  Patient counseled to be compliant with his antithrombotic medications  Ongoing aggressive stroke risk factor management  Therapy recommendations:  pending  Disposition:  Pending  Hypertension  Blood pressure tends to run high  Permissive hypertension (OK if < 220/120) but gradually normalize in 5-7 days  Long-term BP goal normotensive  Hyperlipidemia  Home meds:  No lipid lowering medications prior to admission  LDL 212, goal < 70  Statin intolerance -> myalgias - consider Zetia 10 mg daily.  Continue statin at discharge  Diabetes  HgbA1c 8.1, goal < 7.0  Uncontrolled  Other Stroke Risk Factors  Advanced age  Obesity, Body mass index is 30.61 kg/m., recommend weight loss, diet and exercise as appropriate   Family hx stroke (father and grandmother)   Other Active Problems  Hypokalemia  Chronic kidney disease  Lipid laden plaque does extend for almost 2 cm into the LEFT ICA.   Plan / Recommendations   Stroke workup - await 2-D echo   Needs better glucose control  Discuss treatment options for left ICA plaque with vascular sx  Consider PCSK9 inhibitor for hyperlipidemia due to statin intolerance.  Vascular consult for left ICA plaque   Hospital day # 0  Personally examined patient and images, and have participated in and made any corrections needed to history, physical, neuro exam,assessment and plan as stated above.  I have personally obtained the history, evaluated lab date, reviewed  imaging studies and agree with radiology interpretations.    Sarina Ill, MD Stroke Neurology   To contact Stroke Continuity provider, please refer to [email or web address] After hours, contact General Neurology

## 2017-07-09 NOTE — Evaluation (Signed)
Occupational Therapy Evaluation Patient Details Name: [name] [name] MRN: [id number] DOB: 06/04/1946 Today's Date: 07/09/2017    History of Present Illness 72 year old male with history of diabetes mellitus type 2, CKD stage III, hypertension, hyperlipidemia came to the hospital with complains of confusion and expressive aphasia.  CT of the head and CT of the head and neck did not show any acute abnormality but MRI of the brain showed multifocal area of nonhemorrhagic infarction affecting left MCA territory   Clinical Impression   Pt admitted with the above diagnoses and presents with below problem list. Pt will benefit from continued acute OT to address the below listed deficits and maximize independence with basic ADLs prior to d/c to next venue. PTA pt was independent with ADLs. Pt presents with R-side (dominant) hemiparesis and neglect, communication deficits and impaired balance impacting ADL assist level.  Pt is currently min to mod A with most ADLs, min A for functional transfers and functional mobility. Spouse and 2 daughters present and involved in session. Feel candidate would be great candidate for intensive inpatient rehab program prior to returning home.         Follow Up Recommendations  CIR    Equipment Recommendations  Other (comment)(defer to next venue)    Recommendations for Other Services Rehab consult     Precautions / Restrictions Precautions Precautions: Fall Precaution Comments: up with assistance or supervision Restrictions Other Position/Activity Restrictions: some R side neglect      Mobility Bed Mobility Overal bed mobility: Modified Independent             General bed mobility comments: increased time to EOB  Transfers Overall transfer level: Needs assistance Equipment used: Rolling walker (2 wheeled) Transfers: Sit to/from Stand Sit to Stand: Min assist         General transfer comment: min A to powerup and steady from EOB position.      Balance Overall balance assessment: Needs assistance Sitting-balance support: Bilateral upper extremity supported;Feet supported Sitting balance-Leahy Scale: Fair     Standing balance support: Bilateral upper extremity supported;Single extremity supported;During functional activity Standing balance-Leahy Scale: Fair Standing balance comment: oral care standing at sink with single UE support. min guard for safety.                           ADL either performed or assessed with clinical judgement   ADL Overall ADL's : Needs assistance/impaired Eating/Feeding: Set up;Sitting Eating/Feeding Details (indicate cue type and reason): rest breaks likely needed for feeding with RUE. Daughter reports only minimal spillage (Jell-o) during lunch.  Grooming: Minimal assistance;Sitting Grooming Details (indicate cue type and reason): extra time and effor. Min A for overhead grooming tasks Upper Body Bathing: Moderate assistance;Sitting   Lower Body Bathing: Moderate assistance;Sit to/from stand   Upper Body Dressing : Moderate assistance;Sitting   Lower Body Dressing: Moderate assistance;Sit to/from stand   Toilet Transfer: Minimal assistance;Ambulation;RW;BSC   Toileting- Clothing Manipulation and Hygiene: Moderate assistance;Sit to/from stand   Tub/ Banker: Moderate assistance   Functional mobility during ADLs: Minimal assistance;Rolling walker General ADL Comments: Pt completed bed mobility and in-room functional mobility. Oral care standing at sink with items on right side. Some decreased initation. Reaching for blood pressure cuff in bag while looking for towel to dry off hands.      Vision Baseline Vision/History: Wears glasses Additional Comments: difficult to assess due to impaired communication.      Perception Perception Perception Tested?:  Yes Perception Deficits: Inattention/neglect Inattention/Neglect: Does not attend to right side of body Comments:  Pt incorrectly reporting "left" hand as dominant hand. Corrected by family. Used RUE during bed mobility to powerup to EOB but did not bring RUE fully to EOB, leaving in wrist flexion position with therapist correcting hand position. Family reporting R side neglect   Praxis Praxis Deficits: Limb apraxia;Initiation    Pertinent Vitals/Pain Pain Assessment: No/denies pain     Hand Dominance Right   Extremity/Trunk Assessment Upper Extremity Assessment Upper Extremity Assessment: RUE deficits/detail RUE Deficits / Details: mild decreased initiation and +/-3 out of 5 throughout.  family reports cueing pt to use extremity. daughter reports pt with R arm dangling from recliner earlier, cues to correct.  RUE Coordination: decreased fine motor   Lower Extremity Assessment Lower Extremity Assessment: Defer to PT evaluation RLE Deficits / Details: mildly slowed initiation with some residual neglect similar to RUE but able to transition to stand and walk without buckling or tripping   Cervical / Trunk Assessment Cervical / Trunk Assessment: Normal   Communication Communication Communication: Expressive difficulties   Cognition Arousal/Alertness: Awake/alert Behavior During Therapy: Flat affect;WFL for tasks assessed/performed Overall Cognitive Status: Difficult to assess due to impaired coomunication                                 General Comments: stays engaged in conversation, offers input, answers questions appropriately   General Comments  Discussed positioning of RUE for increased awareness and safety. Spouse and 2 daughters present and involved in session.     Exercises Exercises: Other exercises Other Exercises Other Exercises: issued therapy hand ball and instructed in use. Also discussed ROM exercises including technique and safety. Provided [facility] activity handout.     Shoulder Instructions      Home Living Family/patient expects to be discharged to:: Private  residence Living Arrangements: Spouse/significant other Available Help at Discharge: Family Type of Home: House Home Access: Stairs to enter [employer] of Steps: 2   Home Layout: Two level;Able to live on main level with bedroom/bathroom                      Lives With: Spouse    Prior Functioning/Environment Level of Independence: Independent                 OT Problem List: Decreased strength;Decreased range of motion;Decreased activity tolerance;Impaired balance (sitting and/or standing);Decreased coordination;Decreased cognition;Decreased safety awareness;Decreased knowledge of use of DME or AE;Decreased knowledge of precautions;Impaired tone;Impaired UE functional use      OT Treatment/Interventions: Self-care/ADL training;Therapeutic exercise;Neuromuscular education;DME and/or AE instruction;Therapeutic activities;Cognitive remediation/compensation;Patient/family education;Balance training    OT Goals(Current goals can be found in the care plan section) Acute Rehab OT Goals Patient Stated Goal: back to normal OT Goal Formulation: With patient/family Time For Goal Achievement: 07/23/17 Potential to Achieve Goals: Good ADL Goals Pt Will Perform Grooming: with set-up;sitting Pt Will Perform Upper Body Bathing: with set-up;sitting Pt Will Perform Lower Body Bathing: sit to/from stand;with set-up;with supervision Pt Will Perform Upper Body Dressing: with set-up;sitting Pt Will Perform Lower Body Dressing: sit to/from stand;with set-up;with supervision Pt Will Transfer to Toilet: with supervision;ambulating Pt Will Perform Toileting - Clothing Manipulation and hygiene: with set-up;sit to/from stand;with supervision Pt/caregiver will Perform Home Exercise Program: Increased strength;With theraband;With theraputty;With Supervision;Right Upper extremity;With written HEP provided Additional ADL Goal #1: Pt and family will be independent with  compensation  strategies for R side neglect.  OT Frequency: Min 3X/week   Barriers to D/C:            Co-evaluation              AM-PAC PT "6 Clicks" Daily Activity     Outcome Measure Help from another person eating meals?: A Little Help from another person taking care of personal grooming?: A Lot Help from another person toileting, which includes using toliet, bedpan, or urinal?: A Lot Help from another person bathing (including washing, rinsing, drying)?: A Lot Help from another person to put on and taking off regular upper body clothing?: A Little Help from another person to put on and taking off regular lower body clothing?: A Lot 6 Click Score: 14   End of Session Equipment Utilized During Treatment: Gait belt;Rolling walker  Activity Tolerance: Patient tolerated treatment well Patient left: in bed;with call bell/phone within reach;with bed alarm set;with family/visitor present  OT Visit Diagnosis: Unsteadiness on feet (R26.81);Hemiplegia and hemiparesis;Cognitive communication deficit (R41.841) Hemiplegia - Right/Left: Right Hemiplegia - dominant/non-dominant: Dominant                Time: 1914-7829 OT Time Calculation (min): 30 min Charges:  OT General Charges $OT Visit: 1 Visit OT Evaluation $OT Eval Low Complexity: 1 Low OT Treatments $Self Care/Home Management : 8-22 mins G-Codes:       Hortencia Pilar 07/09/2017, 3:42 PM

## 2017-07-09 NOTE — Evaluation (Addendum)
Physical Therapy Evaluation Patient Details Name: [name] [name] MRN: [id number] DOB: Feb 19, 1946 Today's Date: 07/09/2017   History of Present Illness    72 year old male with history of diabetes mellitus type 2, CKD stage III, hypertension, hyperlipidemia came to the hospital with complains of confusion and expressive aphasia.  CT of the head and CT of the head and neck did not show any acute abnormality but MRI of the brain showed multifocal area of nonhemorrhagic infarction affecting left MCA territory    Clinical Impression  Pt presents with mild functional deficits due to decr motor control, attentional deficits, decr ROM, decr strength impairing gait, transfers and balance.  Pt previously independent, now needs at least supervision to minimal assistance for safety with most basic mobility including attention to and intentional use of impaired limbs. Recommend walk with nursing staff 2-3x/daily to promote motor recovery. Addendum 07/09/17: After consultation with OT and discussion of findings, agree pt would be ideal candidate for short stay in intensive inpt rehab setting to aggressively address multiple deficits and optimize recovery of prior independence.  PT will initiate rehab in hospital and update recommendations as appropriate.  See below for details of exam findings and care plan for goals of care.    Follow Up Recommendations CIR   Equipment Recommendations  None recommended by PT    Recommendations for Other Services       Precautions / Restrictions Precautions Precautions: Fall Precaution Comments: up with assistance or supervision      Mobility  Bed Mobility Overal bed mobility: Modified Independent             General bed mobility comments: increased time to EOB  Transfers Overall transfer level: Needs assistance Equipment used: None Transfers: Sit to/from Stand Sit to Stand: Supervision         General transfer comment: close supervision/standby  assist for safety as first time up and no instability or dizziness noted with transition.    Ambulation/Gait Ambulation/Gait assistance: Min guard Ambulation Distance (Feet): 125 Feet Assistive device: 1 person hand held assist Gait Pattern/deviations: Step-through pattern;Trunk flexed   Gait velocity interpretation: Below normal speed for age/gender General Gait Details: slowed gait, low clearance but intact heel-toe pattern; cues to accentuate heel strike Right to emphasize recovery of gait mechanics  Stairs            Wheelchair Mobility    Modified Rankin (Stroke Patients Only) Modified Rankin (Stroke Patients Only) Pre-Morbid Rankin Score: No symptoms Modified Rankin: Moderate disability     Balance Overall balance assessment: No apparent balance deficits (not formally assessed)                                           Pertinent Vitals/Pain Pain Assessment: No/denies pain    Home Living Family/patient expects to be discharged to:: Private residence Living Arrangements: Spouse/significant other Available Help at Discharge: Family Type of Home: House Home Access: Stairs to enter   [employer] of Steps: 2 Home Layout: Two level;Able to live on main level with bedroom/bathroom        Prior Function Level of Independence: Independent               Hand Dominance   Dominant Hand: Right    Extremity/Trunk Assessment   Upper Extremity Assessment Upper Extremity Assessment: RUE deficits/detail;Defer to OT evaluation RUE Deficits / Details: mild decreased initiation and +/-  3 out of 5 throughout.  family reports cueing pt to use extremity     Lower Extremity Assessment Lower Extremity Assessment: RLE deficits/detail RLE Deficits / Details: mildly slowed initiation with some residual neglect similar to RUE but able to transition to stand and walk without buckling or tripping    Cervical / Trunk Assessment Cervical / Trunk  Assessment: Normal  Communication   Communication: Expressive difficulties(able to answer y/n questions and some spontaneous speech)  Cognition Arousal/Alertness: Awake/alert Behavior During Therapy: WFL for tasks assessed/performed Overall Cognitive Status: Within Functional Limits for tasks assessed                                 General Comments: stays engaged in conversation, offers input, answers questions appropriately      General Comments General comments (skin integrity, edema, etc.): recommend follow-up DGI next session.      Exercises     Assessment/Plan    PT Assessment Patient needs continued PT services  PT Problem List Decreased coordination;Decreased balance;Decreased strength;Decreased range of motion;Decreased mobility(impaired motor function)       PT Treatment Interventions Gait training;Stair training;Functional mobility training;Therapeutic activities;Therapeutic exercise;Balance training;Neuromuscular re-education;Patient/family education    PT Goals (Current goals can be found in the Care Plan section)  Acute Rehab PT Goals Patient Stated Goal: back to normal PT Goal Formulation: With patient/family Time For Goal Achievement: 07/23/17 Potential to Achieve Goals: Good    Frequency Min 4X/week   Barriers to discharge        Co-evaluation               AM-PAC PT "6 Clicks" Daily Activity  Outcome Measure Difficulty turning over in bed (including adjusting bedclothes, sheets and blankets)?: A Little Difficulty moving from lying on back to sitting on the side of the bed? : A Little Difficulty sitting down on and standing up from a chair with arms (e.g., wheelchair, bedside commode, etc,.)?: A Little Help needed moving to and from a bed to chair (including a wheelchair)?: A Little Help needed walking in hospital room?: A Little Help needed climbing 3-5 steps with a railing? : A Little 6 Click Score: 18    End of Session  Equipment Utilized During Treatment: Gait belt Activity Tolerance: Patient tolerated treatment well Patient left: in chair;with call bell/phone within reach;with family/visitor present Nurse Communication: Mobility status PT Visit Diagnosis: Hemiplegia and hemiparesis Hemiplegia - Right/Left: Right Hemiplegia - dominant/non-dominant: Dominant Hemiplegia - caused by: Cerebral infarction    Time: 1212-1243 PT Time Calculation (min) (ACUTE ONLY): 31 min   Charges:   PT Evaluation $PT Eval Low Complexity: 1 Low PT Treatments $Gait Training: 8-22 mins $Therapeutic Activity: 8-22 mins   PT G Codes:        Kearney Hard, PT, DPT, MS Board Certified Geriatric Clinical Specialist  Jawanza, [place] 07/09/2017, 1:39 PM

## 2017-07-09 NOTE — Progress Notes (Signed)
Echocardiogram 2D Echocardiogram has been performed.  [name] [name] 07/09/2017, 4:25 PM

## 2017-07-10 ENCOUNTER — Telehealth: Payer: Self-pay | Admitting: *Deleted

## 2017-07-10 DIAGNOSIS — I639 Cerebral infarction, unspecified: Secondary | ICD-10-CM

## 2017-07-10 DIAGNOSIS — R4701 Aphasia: Secondary | ICD-10-CM

## 2017-07-10 LAB — GLUCOSE, CAPILLARY
GLUCOSE-CAPILLARY: 113 mg/dL — AB (ref 65–99)
GLUCOSE-CAPILLARY: 118 mg/dL — AB (ref 65–99)
GLUCOSE-CAPILLARY: 120 mg/dL — AB (ref 65–99)
GLUCOSE-CAPILLARY: 123 mg/dL — AB (ref 65–99)
Glucose-Capillary: 83 mg/dL (ref 65–99)

## 2017-07-10 MED ORDER — ARTIFICIAL TEARS OPHTHALMIC OINT
TOPICAL_OINTMENT | OPHTHALMIC | Status: DC | PRN
  Filled 2017-07-10: qty 3.5

## 2017-07-10 NOTE — Progress Notes (Signed)
PROGRESS NOTE    Garth Diffley Suh  WPY:[id number] DOB: 17-Oct-1945 DOA: 07/08/2017 PCP: Dorothyann Peng, NP   Brief Narrative:  81 male DM tyII diag 2004 + CKD stage III, hypertension, TURP s/p revision 2012, HLD ,gout hyperlipidemia came to the hospital with complains of confusion and expressive aphasia.  CT of the head and CT of the head and neck did not show any acute abnormality but MRI of the brain showed multifocal area of nonhemorrhagic infarction affecting left MCA territory   Assessment & Plan:   Active Problems:   Aphasia   CVA (cerebral vascular accident) ([place])   Acute embolic stroke (HCC)   Multifocal acute CVA and left MCA territory Mild confusion and expressive aphasia -CT of the head and CTA head and neck is negative but the MRI of the brain shows left MCA territory acute infarction, largest confluent area affects the left frontal cortex and subcortical white matter -Carotids neg echocardiogram EF 55-60%, carotid S doesn't confirm presence of plaque on CTa-defer to neuro planning re: this -Continue telemetry monitoring, neurology following next hemoglobin A1c is 8.1 and LDL is 212 - Blood sugars fairly well controlled here, continue daily aspirin -He is allergic to statin, will use Zetia 10mg  po instead   Diabetes mellitus type 2-insulin-dependent -His blood sugars are relatively in a good range on current regimen of home Lantus -Hemoglobin A1c is 8.1 cbg 83-113  Essential hypertension -Currently holding p.o. medications due to permissive hypertension - Will treated with IV medication if his systolic is greater than 053 or diastolic greater than 976  Hyperlipidemia  -Elevated LDL, intolerant to statin due to muscle cramping.  Will use Zetia 10 mg daily  CKD stage III, baseline Creat 1.7 to 1.8 - Secondary to uncontrolled diabetes and hypertension -Continue gentle hydration, avoid nephrotoxic drugs and monitor his kidney functions closely -saline lock in am   DVT  prophylaxis: SCDs Code Status: Full code Family Communication: Wife and daughter at bedside Disposition Plan: To be determined? CIR   Consultants:   Neuro  Procedures:   none  Antimicrobials:   None   Subjective:  Some dysphonia occasionally in nad strength improve dno n/v/cp  Objective: Vitals:   07/09/17 1844 07/09/17 2020 07/10/17 0200 07/10/17 0620  BP: (!) 195/85 (!) 207/99 (!) 197/97 (!) 184/87  Pulse:  73 73 72  Resp:  18 18 20   Temp:  98.8 F (37.1 C) 98.8 F (37.1 C) (!) 89.7 F (32.1 C)  TempSrc:  Oral Oral Oral  SpO2:  97% 100% 99%  Weight:      Height:        Intake/Output Summary (Last 24 hours) at 07/10/2017 0758 Last data filed at 07/10/2017 0500 Gross per 24 hour  Intake 120 ml  Output 2 ml  Net 118 ml   Filed Weights   07/08/17 0940 07/08/17 1436  Weight: 111.1 kg (245 lb) 111.1 kg (244 lb 14.9 oz)    Examination:  Awake alert pleasan tin nad Smile symm-slight wekaer on the RUE LE equal str Ct ab Power overall 5/5 reflexes benign No le edema s1 s2 no m/r/g   Data Reviewed:   CBC: Recent Labs  Lab 07/08/17 0842 07/08/17 0847  WBC 7.7  --   NEUTROABS 4.7  --   HGB 11.5* 12.6*  HCT 34.0* 37.0*  MCV 78.2  --   PLT 236  --    Basic Metabolic Panel: Recent Labs  Lab 07/08/17 0842 07/08/17 0847 07/09/17 0327  NA 139  144 135  K 4.0 3.7 3.0*  CL 108 108 105  CO2 22  --  20*  GLUCOSE 207* 210* 146*  BUN 27* 30* 23*  CREATININE 2.32* 2.30* 2.21*  CALCIUM 8.6*  --  8.8*   GFR: Estimated Creatinine Clearance: 41.8 mL/min (A) (by C-G formula based on SCr of 2.21 mg/dL (H)). Liver Function Tests: Recent Labs  Lab 07/08/17 0842 07/09/17 0327  AST 23 19  ALT 14* 14*  ALKPHOS 87 88  BILITOT 0.9 1.0  PROT 6.8 6.5  ALBUMIN 3.8 3.5   No results for input(s): LIPASE, AMYLASE in the last 168 hours. No results for input(s): AMMONIA in the last 168 hours. Coagulation Profile: Recent Labs  Lab 07/08/17 0842  INR 1.09    Cardiac Enzymes: No results for input(s): CKTOTAL, CKMB, CKMBINDEX, TROPONINI in the last 168 hours. BNP (last 3 results) No results for input(s): PROBNP in the last 8760 hours. HbA1C: Recent Labs    07/09/17 0327  HGBA1C 8.1*   CBG: Recent Labs  Lab 07/09/17 1122 07/09/17 1712 07/09/17 2023 07/10/17 0005 07/10/17 0447  GLUCAP 145* 114* 164* 123* 83   Lipid Profile: Recent Labs    07/09/17 0327  CHOL 279*  HDL 36*  LDLCALC 212*  TRIG 156*  CHOLHDL 7.8   Thyroid Function Tests: No results for input(s): TSH, T4TOTAL, FREET4, T3FREE, THYROIDAB in the last 72 hours. Anemia Panel: No results for input(s): VITAMINB12, FOLATE, FERRITIN, TIBC, IRON, RETICCTPCT in the last 72 hours. Sepsis Labs: No results for input(s): PROCALCITON, LATICACIDVEN in the last 168 hours.  No results found for this or any previous visit (from the past 240 hour(s)).       Radiology Studies: Ct Angio Head W Or Wo Contrast  Result Date: 07/08/2017 CLINICAL DATA:  Expressive aphasia and RIGHT-sided weakness, improving. EXAM: CT ANGIOGRAPHY HEAD AND NECK TECHNIQUE: Multidetector CT imaging of the head and neck was performed using the standard protocol during bolus administration of intravenous contrast. Multiplanar CT image reconstructions and MIPs were obtained to evaluate the vascular anatomy. Carotid stenosis measurements (when applicable) are obtained utilizing NASCET criteria, using the distal internal carotid diameter as the denominator. CONTRAST:  50 mL ISOVUE-370 IOPAMIDOL (ISOVUE-370) INJECTION 76% COMPARISON:  CT perfusion reported separately. Noncontrast CT head reported earlier. FINDINGS: CTA NECK Aortic arch: Standard branching. Imaged portion shows no evidence of aneurysm or dissection. No significant stenosis of the major arch vessel origins. Right carotid system: Calcified and noncalcified plaque at the bifurcation. No evidence of dissection, stenosis (50% or greater) or occlusion. Left  carotid system: Calcified and particularly noncalcified plaque at the bifurcation. Lipid laden soft plaque extends from the bifurcation nearly 2 cm into cervical ICA, see sagittal image 148 series 14. No evidence of frank dissection, stenosis (50% or greater) or occlusion. Vertebral arteries: BILATERAL patent, LEFT slightly larger. No evidence of dissection, stenosis (50% or greater) or occlusion. Nonvascular soft tissues:  Noncontributory. CTA HEAD Anterior circulation: The skullbase, cavernous, and supraclinoid ICA segments appear widely patent. There is moderate irregularity of the A1 ACA on the LEFT, non worrisome given the dominant RIGHT A1 ACA. No MCA stenosis or occlusion. No significant stenosis, proximal occlusion, aneurysm, or vascular malformation. Posterior circulation: Both vertebral arteries contribute to basilar formation, LEFT dominant. Focal narrowing RIGHT P1 PCA estimated 50%. No significant stenosis, proximal occlusion, aneurysm, or vascular malformation. Venous sinuses: As permitted by contrast timing, patent. Anatomic variants: None of significance. Delayed phase:  Not performed. Review of the MIP images  confirms the above findings. IMPRESSION: No extracranial flow reducing stenosis, but lipid laden plaque does extend for almost 2 cm into the LEFT ICA, areas concerning as a source of recurrent/distal emboli. See discussion above. Findings discussed with ordering provider. Non stenotic calcified and non calcified plaque at both bifurcations. No intracranial large vessel occlusion or significant proximal intracranial anterior circulation stenosis. Electronically Signed   By: Staci Righter M.D.   On: 07/08/2017 09:58   Ct Angio Neck W Or Wo Contrast  Result Date: 07/08/2017 CLINICAL DATA:  Expressive aphasia and RIGHT-sided weakness, improving. EXAM: CT ANGIOGRAPHY HEAD AND NECK TECHNIQUE: Multidetector CT imaging of the head and neck was performed using the standard protocol during bolus  administration of intravenous contrast. Multiplanar CT image reconstructions and MIPs were obtained to evaluate the vascular anatomy. Carotid stenosis measurements (when applicable) are obtained utilizing NASCET criteria, using the distal internal carotid diameter as the denominator. CONTRAST:  50 mL ISOVUE-370 IOPAMIDOL (ISOVUE-370) INJECTION 76% COMPARISON:  CT perfusion reported separately. Noncontrast CT head reported earlier. FINDINGS: CTA NECK Aortic arch: Standard branching. Imaged portion shows no evidence of aneurysm or dissection. No significant stenosis of the major arch vessel origins. Right carotid system: Calcified and noncalcified plaque at the bifurcation. No evidence of dissection, stenosis (50% or greater) or occlusion. Left carotid system: Calcified and particularly noncalcified plaque at the bifurcation. Lipid laden soft plaque extends from the bifurcation nearly 2 cm into cervical ICA, see sagittal image 148 series 14. No evidence of frank dissection, stenosis (50% or greater) or occlusion. Vertebral arteries: BILATERAL patent, LEFT slightly larger. No evidence of dissection, stenosis (50% or greater) or occlusion. Nonvascular soft tissues:  Noncontributory. CTA HEAD Anterior circulation: The skullbase, cavernous, and supraclinoid ICA segments appear widely patent. There is moderate irregularity of the A1 ACA on the LEFT, non worrisome given the dominant RIGHT A1 ACA. No MCA stenosis or occlusion. No significant stenosis, proximal occlusion, aneurysm, or vascular malformation. Posterior circulation: Both vertebral arteries contribute to basilar formation, LEFT dominant. Focal narrowing RIGHT P1 PCA estimated 50%. No significant stenosis, proximal occlusion, aneurysm, or vascular malformation. Venous sinuses: As permitted by contrast timing, patent. Anatomic variants: None of significance. Delayed phase:  Not performed. Review of the MIP images confirms the above findings. IMPRESSION: No  extracranial flow reducing stenosis, but lipid laden plaque does extend for almost 2 cm into the LEFT ICA, areas concerning as a source of recurrent/distal emboli. See discussion above. Findings discussed with ordering provider. Non stenotic calcified and non calcified plaque at both bifurcations. No intracranial large vessel occlusion or significant proximal intracranial anterior circulation stenosis. Electronically Signed   By: Staci Righter M.D.   On: 07/08/2017 09:58   Mr Brain Wo Contrast  Result Date: 07/08/2017 CLINICAL DATA:  Confusion with expressive aphasia. This began earlier today. EXAM: MRI HEAD WITHOUT CONTRAST TECHNIQUE: Multiplanar, multiecho pulse sequences of the brain and surrounding structures were obtained without intravenous contrast. COMPARISON:  CT head without contrast, CT perfusion, and CTA head neck, all performed earlier today. FINDINGS: The patient was unable to remain motionless for the exam. Small or subtle lesions could be overlooked. Brain: Multifocal areas of restricted diffusion, corresponding low ADC, affect the LEFT hemisphere within the LEFT MCA territory. Largest confluent area involves the LEFT frontal cortex and subcortical white matter. Additional smaller areas are seen in the LEFT anterior frontal cortex, LEFT posterior frontal cortex, LEFT superior insular ribbon, and LEFT periventricular white matter. No hemorrhage, mass lesion, hydrocephalus, or extra-axial fluid. Vascular:  Flow voids are maintained.  No large vessel occlusion. Skull and upper cervical spine: Normal marrow signal. Sinuses/Orbits: Grossly negative. Other: None. IMPRESSION: Multifocal areas of restricted diffusion consistent with acute nonhemorrhagic infarction affecting the LEFT MCA territory. Largest confluent area affects the LEFT frontal cortex and subcortical white matter.Shower of emboli is suspected. Electronically Signed   By: Staci Righter M.D.   On: 07/08/2017 14:49   Ct Cerebral Perfusion W  Contrast  Result Date: 07/08/2017 CLINICAL DATA:  Code stroke. Focal neuro deficit less than 6 hours. Transient ischemic attack versus hypertensive encephalopathy. Stroke risk factors include hypertension and diabetes. Expressive aphasia and RIGHT-sided weakness, improving exam. Initial systolic blood pressure to 40 is improving. EXAM: CT PERFUSION BRAIN TECHNIQUE: Multiphase CT imaging of the brain was performed following IV bolus contrast injection. Subsequent parametric perfusion maps were calculated using RAPID software. CONTRAST:  50 mL ISOVUE-370 IOPAMIDOL (ISOVUE-370) INJECTION 76% COMPARISON:  Code stroke CT earlier in the day FINDINGS: CT Brain Perfusion Findings: CBF (<30%) Volume: 2mL Perfusion (Tmax>6.0s) volume: 39mL Mismatch Volume: 26mL Mismatch location:  LEFT posterior frontal cortex. IMPRESSION: No core infarct is demonstrated. CBF (< 30%) volume: 0 mL. There is a small area of perfusion mismatch LEFT posterior frontal cortex. Electronically Signed   By: Staci Righter M.D.   On: 07/08/2017 09:36   Ct Head Code Stroke Wo Contrast  Result Date: 07/08/2017 CLINICAL DATA:  Code stroke.  RIGHT-sided weakness with  aphasia. EXAM: CT HEAD WITHOUT CONTRAST TECHNIQUE: Contiguous axial images were obtained from the base of the skull through the vertex without intravenous contrast. COMPARISON:  None. FINDINGS: Brain: No evidence of acute infarction, hemorrhage, hydrocephalus, extra-axial collection or mass lesion/mass effect. Normal for age cerebral volume. Mild hypoattenuation of the white matter, possible small vessel disease. Vascular: No hyperdense vessel or unexpected calcification. Skull: Normal. Negative for fracture or focal lesion. Sinuses/Orbits: No acute finding. Other: None. ASPECTS [facility] Stroke Program Early CT Score) - Ganglionic level infarction (caudate, lentiform nuclei, internal capsule, insula, M1-M3 cortex): 7 - Supraganglionic infarction (M4-M6 cortex): 3 Total score (0-10 with 10  being normal): 10 IMPRESSION: 1. Negative exam 2. ASPECTS is 10. These results were communicated to Dr. Lorraine Lax At 8:59 amon 1/5/2019by text page via the [facility] messaging system. Electronically Signed   By: Staci Righter M.D.   On: 07/08/2017 09:02        Scheduled Meds: . aspirin EC  325 mg Oral Daily  . aspirin  325 mg Oral Once  . ezetimibe  10 mg Oral Daily  . hydrALAZINE  25 mg Oral TID  . insulin glargine  25 Units Subcutaneous Q2200   Continuous Infusions: . sodium chloride 75 mL/hr at 07/09/17 0541  . dextrose 5 % and 0.45% NaCl       LOS: 1 day    Time spent: 30 mins     Nita Sells, MD [facility] Pager [phone]   If 7PM-7AM, please contact night-coverage [email or web address] Password [facility] 07/10/2017, 7:58 AM

## 2017-07-10 NOTE — Progress Notes (Signed)
Physical Therapy Treatment Patient Details Name: [name] [name] MRN: [id number] DOB: 03-Mar-1946 Today's Date: 07/10/2017    History of Present Illness 72 year old male with history of diabetes mellitus type 2, CKD stage III, hypertension, hyperlipidemia came to the hospital with complains of confusion and expressive aphasia.  CT of the head and CT of the head and neck did not show any acute abnormality but MRI of the brain showed multifocal area of nonhemorrhagic infarction affecting left MCA territory    PT Comments    Pt con't to demonstrate expressive aphasia, R sided inattention, difficulty with comprehension of multi-step commands, impaired safety awareness, and impaired balance as demo'd by score of 14 on DGI. Pt was indep PTA and is motivated to return to baseline. PT to greatly benefit from aggressive rehab post d/c to address mentioned deficits for maximal functional recovery.   Follow Up Recommendations  CIR     Equipment Recommendations  None recommended by PT    Recommendations for Other Services       Precautions / Restrictions Precautions Precautions: Fall Precaution Comments: up with assist Restrictions Weight Bearing Restrictions: No Other Position/Activity Restrictions: R sided weakness and neglect    Mobility  Bed Mobility Overal bed mobility: Needs Assistance Bed Mobility: Rolling;Sidelying to Sit Rolling: Supervision Sidelying to sit: Supervision       General bed mobility comments: increased time, definite use of bed rail, no physical assist.  Transfers Overall transfer level: Needs assistance Equipment used: None Transfers: Sit to/from Stand Sit to Stand: Min assist         General transfer comment: minA for safety  Ambulation/Gait Ambulation/Gait assistance: Min assist Ambulation Distance (Feet): 250 Feet Assistive device: None(held onto gait belt) Gait Pattern/deviations: Step-through pattern;Drifts right/left Gait velocity: slow Gait  velocity interpretation: Below normal speed for age/gender General Gait Details: pt hovers to R side and consistantly near misses objects on the right side. Pt unable to navigate the hallways with simple R/L directions   Stairs Stairs: Yes   Stair Management: One rail Right;Alternating pattern Number of Stairs: 3 General stair comments: pt with trip up stairs due to inability to clear R foot requiring minA to maintain balance  Wheelchair Mobility    Modified Rankin (Stroke Patients Only) Modified Rankin (Stroke Patients Only) Pre-Morbid Rankin Score: No symptoms Modified Rankin: Moderate disability     Balance Overall balance assessment: Needs assistance Sitting-balance support: Bilateral upper extremity supported Sitting balance-Leahy Scale: Good     Standing balance support: No upper extremity supported;During functional activity Standing balance-Leahy Scale: Fair Standing balance comment: minA to maintain balance during pertabations                 Standardized Balance Assessment Standardized Balance Assessment : Dynamic Gait Index   Dynamic Gait Index Level Surface: Mild Impairment Change in Gait Speed: Mild Impairment Gait with Horizontal Head Turns: Mild Impairment Gait with Vertical Head Turns: Mild Impairment Gait and Pivot Turn: Mild Impairment Step Over Obstacle: Mild Impairment Step Around Obstacles: Moderate Impairment Steps: Moderate Impairment Total Score: 14      Cognition Arousal/Alertness: Awake/alert Behavior During Therapy: Flat affect Overall Cognitive Status: Impaired/Different from baseline Area of Impairment: Following commands;Problem solving                       Following Commands: Follows multi-step commands inconsistently;Follows one step commands with increased time     Problem Solving: Slow processing;Difficulty sequencing;Requires verbal cues;Requires tactile cues General Comments: pt with impaired  directional  finding, L verse R. Pt unable to follow multistep commands consistantly      Exercises      General Comments General comments (skin integrity, edema, etc.): observed pt trying to eat with R hand, pt with poor fine and gross motor requiring assist of L hand, would benefit from grip on silverware, left message with OT      Pertinent Vitals/Pain Pain Assessment: No/denies pain    Home Living                      Prior Function            PT Goals (current goals can now be found in the care plan section) Acute Rehab PT Goals Patient Stated Goal: back to normal Progress towards PT goals: Progressing toward goals    Frequency    Min 4X/week      PT Plan Current plan remains appropriate    Co-evaluation              AM-PAC PT "6 Clicks" Daily Activity  Outcome Measure  Difficulty turning over in bed (including adjusting bedclothes, sheets and blankets)?: A Little Difficulty moving from lying on back to sitting on the side of the bed? : A Little Difficulty sitting down on and standing up from a chair with arms (e.g., wheelchair, bedside commode, etc,.)?: A Little Help needed moving to and from a bed to chair (including a wheelchair)?: A Little Help needed walking in hospital room?: A Little Help needed climbing 3-5 steps with a railing? : A Little 6 Click Score: 18    End of Session Equipment Utilized During Treatment: Gait belt Activity Tolerance: Patient tolerated treatment well Patient left: in chair;with call bell/phone within reach;with family/visitor present Nurse Communication: Mobility status PT Visit Diagnosis: Hemiplegia and hemiparesis Hemiplegia - Right/Left: Right Hemiplegia - dominant/non-dominant: Dominant Hemiplegia - caused by: Cerebral infarction     Time: 0737-0801 PT Time Calculation (min) (ACUTE ONLY): 24 min  Charges:  $Gait Training: 8-22 mins $Neuromuscular Re-education: 8-22 mins                    G Codes:        Kittie Plater, PT, DPT Pager #: [phone] Office #: [phone]    [place] 07/10/2017, 10:08 AM

## 2017-07-10 NOTE — Progress Notes (Signed)
STROKE TEAM PROGRESS NOTE   HISTORY OF PRESENT ILLNESS (per record) [name] [name] is an 72 y.o. male malewith medical history significant ofdiabetes mellitus type 2, CKD stage III, hypertension, hyperlipidemia presented as stroke alert with aphasia. LSN was 2 am according to wife.  When he woke up at 8am,he was confused, speaking only few words. After few hours, EMS was alerted. BP was 161 systolic per EMS. On arrival patient had aphasia and mild right side weakness.   Date last known well: 1.5.19 Time last known well: 2 am tPA Given: no, outside window NIHSS: 4 Baseline MRS 0     SUBJECTIVE (INTERVAL HISTORY) His  Family is not at bedside, discussed findings and answered all questions. Discussed at length the need for TEE and loop recorder   Home Medications:  Current Meds  Medication Sig  . amLODipine (NORVASC) 10 MG tablet Take 1 tablet (10 mg total) by mouth daily.  . benazepril (LOTENSIN) 40 MG tablet Take 1 tablet (40 mg total) by mouth 2 (two) times daily.  . furosemide (LASIX) 80 MG tablet 160 mg po q a.m and 80 mg po q afternoon  . hydrALAZINE (APRESOLINE) 25 MG tablet Take 1 tablet (25 mg total) by mouth 3 (three) times daily.  . insulin aspart (NOVOLOG) 100 UNIT/ML injection Take 6 units with every meal unless blood sugar above 200 than take 10 units (Patient taking differently: Inject 12 Units into the skin 3 (three) times daily with meals. )  . Insulin Glargine (LANTUS SOLOSTAR) 100 UNIT/ML Solostar Pen INJECT 20 UNITS SUBCUTANEOUSLY ONCE DAILY. NEED TO MAKE APPOINTMENT. (Patient taking differently: Inject 25 Units into the skin daily at 10 pm. )  . labetalol (NORMODYNE) 200 MG tablet Take 1 tablet (200 mg total) by mouth 2 (two) times daily.      Hospital Medications:  . aspirin EC  325 mg Oral Daily  . aspirin  325 mg Oral Once  . ezetimibe  10 mg Oral Daily  . hydrALAZINE  25 mg Oral TID  . insulin glargine  25 Units Subcutaneous Q2200     OBJECTIVE Temp:  [89.7 F (32.1 C)-99 F (37.2 C)] 99 F (37.2 C) (01/07 1349) Pulse Rate:  [72-82] 73 (01/07 1349) Cardiac Rhythm: Normal sinus rhythm (01/07 0724) Resp:  [18-20] 18 (01/07 1349) BP: (171-207)/(85-104) 206/100 (01/07 1354) SpO2:  [97 %-100 %] 97 % (01/07 1349)  CBC:  Recent Labs  Lab 07/08/17 0842 07/08/17 0847  WBC 7.7  --   NEUTROABS 4.7  --   HGB 11.5* 12.6*  HCT 34.0* 37.0*  MCV 78.2  --   PLT 236  --     Basic Metabolic Panel:  Recent Labs  Lab 07/08/17 0842 07/08/17 0847 07/09/17 0327  NA 139 144 135  K 4.0 3.7 3.0*  CL 108 108 105  CO2 22  --  20*  GLUCOSE 207* 210* 146*  BUN 27* 30* 23*  CREATININE 2.32* 2.30* 2.21*  CALCIUM 8.6*  --  8.8*    Lipid Panel:     Component Value Date/Time   CHOL 279 (H) 07/09/2017 0327   TRIG 156 (H) 07/09/2017 0327   TRIG 323 (HH) 06/13/2006 1050   HDL 36 (L) 07/09/2017 0327   CHOLHDL 7.8 07/09/2017 0327   VLDL 31 07/09/2017 0327   LDLCALC 212 (H) 07/09/2017 0327   HgbA1c:  Lab Results  Component Value Date   HGBA1C 8.1 (H) 07/09/2017   Urine Drug Screen: No results found for: LABOPIA,  COCAINSCRNUR, LABBENZ, AMPHETMU, THCU, LABBARB  Alcohol Level No results found for: ETH  IMAGING   Ct Angio Head W Or Wo Contrast Ct Angio Neck W Or Wo Contrast 07/08/2017 IMPRESSION:  No extracranial flow reducing stenosis, but lipid laden plaque does extend for almost 2 cm into the LEFT ICA, areas concerning as a source of recurrent/distal emboli. See discussion above. Findings discussed with ordering provider. Non stenotic calcified and non calcified plaque at both bifurcations. No intracranial large vessel occlusion or significant proximal intracranial anterior circulation stenosis.    Mr Brain Wo Contrast 07/08/2017 IMPRESSION: Multifocal areas of restricted diffusion consistent with acute nonhemorrhagic infarction affecting the LEFT MCA territory. Largest confluent area affects the LEFT frontal cortex  and subcortical white matter. Shower of emboli is suspected.   Ct Cerebral Perfusion W Contrast  07/08/2017 IMPRESSION:  No core infarct is demonstrated. CBF (< 30%) volume: 0 mL.  There is a small area of perfusion mismatch LEFT posterior frontal cortex.   Ct Head Code Stroke Wo Contrast 07/08/2017 IMPRESSION:  1. Negative exam  2. ASPECTS is 10.     Transthoracic Echocardiogram - Left ventricle: The cavity size was mildly dilated. Wall   thickness was increased in a pattern of mild LVH. Systolic   function was normal. The estimated ejection fraction was in the   range of 55% to 60%. Wall motion was normal; there were no   regional wall motion abnormalities    Bilateral Carotid Dopplers -1-39% ICA stenosis bilaterally.         PHYSICAL EXAM Vitals:   07/10/17 0620 07/10/17 0912 07/10/17 1349 07/10/17 1354  BP: (!) 184/87 (!) 171/98 (!) 194/104 (!) 206/100  Pulse: 72 82 73   Resp: 20 19 18    Temp: (!) 89.7 F (32.1 C) 99 F (37.2 C) 99 F (37.2 C)   TempSrc: Oral Oral Oral   SpO2: 99% 98% 97%   Weight:      Height:       PHYSICAL EXAM Physical exam: Exam: Gen: NAD Eyes: anicteric sclerae, moist conjunctivae                    CV: no MRG, no carotid bruits, no peripheral edema Mental Status: Alert, follows commands  Neuro: Detailed Neurologic Exam  Speech:    No aphasia, no dysarthria  Cranial Nerves:    The pupils are equal, round, and reactive to light.. Attempted, Fundi not visualized.  EOMI. Visual fields full. Face symmetric, Tongue midline. Hearing intact to voice. Shoulder shrug intact. Hearing intact to voice.   Motor Observation:    no involuntary movements noted. Tone appears normal.     Strength:    Right hemiparesis 4/5 arm greater than leg. Diminished fine finger movements on the right. Orbits left over right upper extremity.     Sensation:  Intact to LT  Plantars downgoing.    ASSESSMENT/PLAN [name] [name] is a 72 y.o. male  with history of chronic kidney disease, hypertension, hyperlipidemia, and diabetes mellitus presenting with aphasia, confusion, and mild right-sided weakness. He did not receive IV t-PA due to late presentation.  Stroke:  Left middle cerebral artery territory embolic infarcts - possibly from the left ICA.  Resultant  Mild right hemiparesis  CT head - Negative exam   MRI head - Multifocal areas of restricted diffusion consistent with acute nonhemorrhagic infarction affecting the LEFT MCA territory  MRA head - not performed  CTA H&N -  lipid laden plaque does  extend for almost 2 cm into the LEFT ICA.  Carotid Doppler - pending  2D Echo - pending  LDL - 212  HgbA1c - 8.1  VTE prophylaxis - SCDs Diet Carb Modified Fluid consistency: Thin; Room service appropriate? Yes  No antithrombotic prior to admission, now on aspirin 325 mg daily  Patient counseled to be compliant with his antithrombotic medications  Ongoing aggressive stroke risk factor management  Therapy recommendations:  pending  Disposition:  Pending  Hypertension  Blood pressure tends to run high  Permissive hypertension (OK if < 220/120) but gradually normalize in 5-7 days  Long-term BP goal normotensive  Hyperlipidemia  Home meds:  No lipid lowering medications prior to admission  LDL 212, goal < 70  Statin intolerance -> myalgias - consider Zetia 10 mg daily.  Continue statin at discharge  Diabetes  HgbA1c 8.1, goal < 7.0  Uncontrolled  Other Stroke Risk Factors  Advanced age  Obesity, Body mass index is 30.61 kg/m., recommend weight loss, diet and exercise as appropriate   Family hx stroke (father and grandmother)   Other Active Problems  Hypokalemia  Chronic kidney disease  Lipid laden plaque does extend for almost 2 cm into the LEFT ICA.   Plan / Recommendations   Stroke workup - await 2-D echo   Needs better glucose control  Discuss treatment options for left ICA plaque  with vascular sx  Consider PCSK9 inhibitor for hyperlipidemia due to statin intolerance.  Vascular consult for left ICA plaque   Hospital day # 1  I have personally examined this patient, reviewed notes, independently viewed imaging studies, participated in medical decision making and plan of care.ROS completed by me personally and pertinent positives fully documented  I have made any additions or clarifications directly to the above note. Patient has presented with embolic left MCA infarct of cryptogenic etiology without definite identified source. Recommend TEE and loop recorder insertion. Discussed with Dr. Verlon Au. Greater than 50% time during this 25 minute visit was spent on counseling and coordination of care about his recent stroke and discussion about evaluation and treatment plan and answered questions.  [name] Contras, MD Medical Director [place] Pager: [phone] 07/10/2017 3:37 PM     [name] [name],, MD Stroke Neurology   To contact Stroke Continuity provider, please refer to [email or web address] After hours, contact General Neurology

## 2017-07-10 NOTE — Telephone Encounter (Signed)
Patient Name: [name] [name] Gender: Male DOB: 10/13/1945 Age: 72 Y 108 M 10 D Return Phone Number: [phone] (Primary) Address: City/State/Zip: [place] [address] Client [facility] Night - Client Client Site [facility] - Night Physician Dorothyann Peng - NP Contact Type Call Who Is Calling Patient / Member / Family / Caregiver Call Type Triage / Clinical Caller Name [name] Pruitt Relationship To Patient Spouse Return Phone Number [phone] (Primary) Chief Complaint CONFUSION - new onset Reason for Call Symptomatic / Request for Health Information Initial Comment Husband is acting funny. He is repeating himself, not completing his sentences' and agitated. Translation No Nurse Assessment Nurse: Julien Girt, RN, Almyra Free Date/Time Eilene Ghazi Time): 07/08/2017 8:01:31 AM Confirm and document reason for call. If symptomatic, describe symptoms. ---Caller states her husband is acting "funny" this morning. He is repeating himself, not completing his sentence's and is very agitated. Does the patient have any new or worsening symptoms? ---Yes Will a triage be completed? ---Yes Related visit to physician within the last 2 weeks? ---No Does the PT have any chronic conditions? (i.e. diabetes, asthma, etc.) ---Yes List chronic conditions. ---Htn, Diabetes Is this a behavioral health or substance abuse call? ---No Guidelines Guideline Title Affirmed Question Affirmed Notes Nurse Date/Time (Eastern Time) Confusion - Delirium [1] Difficult to awaken or acting confused (e.g., disoriented, slurred speech) AND [2] present now AND [3] has diabetes (diabetes mellitus) Julien Girt, RN, Almyra Free 07/08/2017 8:03:36 AM Disp. Time Eilene Ghazi Time) Disposition Final User 07/08/2017 7:59:01 AM Send to Urgent Queue Blanchie Dessert 07/08/2017 8:14:49 AM 911 Outcome Documentation Julien Girt, RN, Almyra Free PLEASE NOTE: All timestamps contained within this report are represented as  [place] Standard Time. CONFIDENTIALTY NOTICE: This fax transmission is intended only for the addressee. It contains information that is legally privileged, confidential or otherwise protected from use or disclosure. If you are not the intended recipient, you are strictly prohibited from reviewing, disclosing, copying using or disseminating any of this information or taking any action in reliance on or regarding this information. If you have received this fax in error, please notify us immediately by telephone so that we can arrange for its return to [place]. Phone: [phone], Toll-Free: [phone], Fax: [phone] Page: 2 of 2 Call Id: [id number] [place]. Time Eilene Ghazi Time) Disposition Final User Reason: Obtained vm, left message that I would cb 07/08/2017 8:18:06 AM 911 Outcome Documentation Julien Girt, RN, Almyra Free Reason: Spoke to [place], EMS are at the home. 07/08/2017 8:06:40 AM Call EMS 911 Now Yes Julien Girt, RN, Almyra Free

## 2017-07-10 NOTE — Progress Notes (Signed)
Inpatient Rehabilitation  Initiated insurance authorization with [facility] Medicare plan to follow up with the patient tomorrow. Call if questions.   Carmelia Roller., CCC/SLP Admission Coordinator  [place]  Cell [phone]

## 2017-07-10 NOTE — Progress Notes (Signed)
Occupational Therapy Treatment Patient Details Name: [name] [name] MRN: [id number] DOB: Sep 07, 1945 Today's Date: 07/10/2017    History of present illness 72 year old male with history of diabetes mellitus type 2, CKD stage III, hypertension, hyperlipidemia came to the hospital with complains of confusion and expressive aphasia.  CT of the head and CT of the head and neck did not show any acute abnormality but MRI of the brain showed multifocal area of nonhemorrhagic infarction affecting left MCA territory   OT comments  Pt progressing towards goals. Pt completed simple grooming ADLs with setup assist, issued built up handle for increased independence during self-feeding and fine motor tasks. Pt engaged in multiple RUE fine motor activities with min verbal cues/minA throughout. Pt motivated and willing to work with therapy with family present and providing support during session. OOB activity deferred this session due to increased BP levels this PM. Feel Pt remains appropriate for CIR level services after discharge. Will continue to follow acutely to progress Pt towards established OT goals.    Follow Up Recommendations  CIR    Equipment Recommendations  Other (comment)(defer to next venue )    Recommendations for Other Services Rehab consult    Precautions / Restrictions Precautions Precautions: Fall Precaution Comments: up with assist Restrictions Weight Bearing Restrictions: No Other Position/Activity Restrictions: R sided weakness and neglect                                                                                           ADL either performed or assessed with clinical judgement   ADL       Grooming: Set up;Wash/dry face Grooming Details (indicate cue type and reason): setup assist provided as Pt with watering eyes; issued built up handle and educated Pt/[name] on it's uses to increase independence during self-feeding  and grooming tasks                                General ADL Comments: Pt with increased BP this afternoon (212/103) therefore OOB activity deferred at this time. Pt having increasingly watering eyes, provided washcloth for bed level grooming; issued built up handle for ADL completion and education provided on its uses; Pt participated in [facility] activities with minA for completion;                       Cognition Arousal/Alertness: Awake/alert Behavior During Therapy: Flat affect Overall Cognitive Status: Impaired/Different from baseline Area of Impairment: Following commands;Problem solving                       Following Commands: Follows multi-step commands inconsistently;Follows one step commands with increased time     Problem Solving: Slow processing;Difficulty sequencing;Requires verbal cues;Requires tactile cues General Comments: Pt initially not able to state DOB, able to state his age with increased time; Upon exiting and returning to room Pt able to state his DOB with increased time/effort, with min assist for word finding when stating the year         Exercises Other Exercises  Other Exercises: Pt engaged in RUE fine motor activities using therapy hand ball, isolated movement of digits, finger opposition, opening/closing containers, and completing exercises on [facility] activity handout                 Pertinent Vitals/ Pain       Pain Assessment: No/denies pain                                                          Frequency  Min 3X/week        Progress Toward Goals  OT Goals(current goals can now be found in the care plan section)  Progress towards OT goals: Progressing toward goals  Acute Rehab OT Goals Patient Stated Goal: back to normal OT Goal Formulation: With patient/family Time For Goal Achievement: 07/23/17 Potential to Achieve Goals: Good  Plan Discharge plan remains appropriate                      AM-PAC PT "6 Clicks" Daily Activity     Outcome Measure   Help from another person eating meals?: A Little Help from another person taking care of personal grooming?: A Lot Help from another person toileting, which includes using toliet, bedpan, or urinal?: A Lot Help from another person bathing (including washing, rinsing, drying)?: A Lot Help from another person to put on and taking off regular upper body clothing?: A Little Help from another person to put on and taking off regular lower body clothing?: A Lot 6 Click Score: 14    End of Session    OT Visit Diagnosis: Unsteadiness on feet (R26.81);Hemiplegia and hemiparesis;Cognitive communication deficit (R41.841) Hemiplegia - Right/Left: Right Hemiplegia - dominant/non-dominant: Dominant   Activity Tolerance Patient tolerated treatment well   Patient Left in bed;with call bell/phone within reach;with family/visitor present   Nurse Communication Mobility status;Other (comment)(BP readings )        Time: 1525-1550 OT Time Calculation (min): 25 min  Charges: OT General Charges $OT Visit: 1 Visit OT Treatments $Therapeutic Activity: 8-22 mins  [name] [name], [name] Pager [phone] 07/10/2017    [name] [name] 07/10/2017, 4:08 PM

## 2017-07-10 NOTE — Consult Note (Signed)
Physical Medicine and Rehabilitation Consult   Reason for Consult: Functional deficits due to stroke Referring Physician: Dr. Verlon Au   HPI: [name] [name] is a 72 y.o. male with history of HTN, T2DM, who was admitted on 07/08/16 with difficulty speaking and mild right sided weakness. BP elevated in ED and CT head negative of for hemorrhage. CTA/Perfusion  brain showed lipid laden plaques extending almost 2 cm into L-ICA concerning for recurrent/distal emboli.  MRI brains showed multifocal areas of restricted diffusion in L-MCA territory--larges in left frontal cortex and subcortical white matter felt to be embolic. 2D echo showed EF 55-60% with no wall abnormality. Carotid dopplers without significant ICA stenosis.   On ASA for secondary stroke prevention and Vascular to be consulted for input on ICA plaque. Speech therapy evaluation revealed expressive > receptive aphasia probably due to verbal apraxia. Patient limited by right sided weakness with inattention, difficulty processing higher levels commands, apraxia and aphasia. Therapy evaluations completed yesterday and CIR recommended due to functional deficits.    Review of Systems  Constitutional: Negative for chills and fever.  HENT: Negative for hearing loss and tinnitus.   Eyes: Negative for blurred vision and double vision.  Respiratory: Negative for cough and hemoptysis.   Cardiovascular: Negative for chest pain and palpitations.  Gastrointestinal: Positive for constipation. Negative for heartburn and vomiting.  Genitourinary: Negative for dysuria.  Musculoskeletal: Negative for myalgias and neck pain.  Skin: Negative for rash.  Neurological: Positive for speech change and focal weakness. Negative for dizziness, weakness and headaches.  Psychiatric/Behavioral: Positive for memory loss.      Past Medical History:  Diagnosis Date  . DIABETES MELLITUS, TYPE II 01/02/2007  . HYPERLIPIDEMIA 06/18/2007  . HYPERTENSION  01/02/2007  . RENAL INSUFFICIENCY 02/05/2008    Past Surgical History:  Procedure Laterality Date  . SHOULDER SURGERY     left   Family History  Problem Relation Age of Onset  . Hypertension Mother   . Diabetes Mother   . Stroke Father   . Stroke Maternal Grandmother     Social History:  Married. Retired Agricultural consultant. Wife at home and can provide supervision after discharge. [name] [name] reports that  has never smoked. [name] [name] has never used smokeless tobacco. [name] [name] drinks alcohol on rare occasions or wine. [name] [name] reports that [name] [name] does use drugs.    Allergies  Allergen Reactions  . Bactrim [Sulfamethoxazole-Trimethoprim] Hives, Itching and Swelling  . Penicillins     Has patient had a PCN reaction causing immediate rash, facial/tongue/throat swelling, SOB or lightheadedness with hypotension: Unk Has patient had a PCN reaction causing severe rash involving mucus membranes or skin necrosis: Unk Has patient had a PCN reaction that required hospitalization: Unk Has patient had a PCN reaction occurring within the last 10 years: No If all of the above answers are "NO", then may proceed with Cephalosporin use.  . Statins     myalgia  . Sulfa Drugs Cross Reactors Hives, Itching and Swelling    Medications Prior to Admission  Medication Sig Dispense Refill  . amLODipine (NORVASC) 10 MG tablet Take 1 tablet (10 mg total) by mouth daily. 30 tablet 0  . benazepril (LOTENSIN) 40 MG tablet Take 1 tablet (40 mg total) by mouth 2 (two) times daily. 60 tablet 0  . furosemide (LASIX) 80 MG tablet 160 mg po q a.m and 80 mg po q afternoon 270 tablet 3  . hydrALAZINE (APRESOLINE) 25 MG tablet Take 1 tablet (25 mg total) by mouth  3 (three) times daily. 90 tablet 0  . insulin aspart (NOVOLOG) 100 UNIT/ML injection Take 6 units with every meal unless blood sugar above 200 than take 10 units (Patient taking differently: Inject 12 Units into the skin 3 (three) times daily with meals. ) 3 vial 11  . Insulin Glargine (LANTUS SOLOSTAR)  100 UNIT/ML Solostar Pen INJECT 20 UNITS SUBCUTANEOUSLY ONCE DAILY. NEED TO MAKE APPOINTMENT. (Patient taking differently: Inject 25 Units into the skin daily at 10 pm. ) 2 pen 0  . labetalol (NORMODYNE) 200 MG tablet Take 1 tablet (200 mg total) by mouth 2 (two) times daily. 180 tablet 0  . calcitRIOL (ROCALTROL) 0.25 MCG capsule Take 1 capsule by mouth daily.    . cyclobenzaprine (FLEXERIL) 10 MG tablet Take 1 tablet (10 mg total) by mouth 3 (three) times daily as needed for muscle spasms. 30 tablet 0  . fluvastatin (LESCOL) 40 MG capsule Take 1 capsule (40 mg total) by mouth at bedtime. (Patient not taking: Reported on 07/08/2017) 30 capsule 11  . glucose blood (ONETOUCH VERIO) test strip 1 each by Other route daily. Use as instructed (Patient not taking: Reported on 07/08/2017) 100 each 11  . Insulin Pen Needle (B-D UF III MINI PEN NEEDLES) 31G X 5 MM MISC USE AS DIRECTED (Patient not taking: Reported on 07/08/2017) 100 each 11  . nateglinide (STARLIX) 120 MG tablet Take 1 tablet (120 mg total) by mouth 3 (three) times daily with meals. (Patient not taking: Reported on 07/08/2017) 90 tablet 6  . potassium chloride SA (K-DUR,KLOR-CON) 20 MEQ tablet Take 1 tablet by mouth daily.      Home: Home Living Family/patient expects to be discharged to:: Private residence Living Arrangements: Spouse/significant other Available Help at Discharge: Family Type of Home: House Home Access: Stairs to enter Technical brewer of Steps: [facility]: Two level, Able to live on main level with bedroom/bathroom  Lives With: Spouse  Functional History: Prior Function Level of Independence: Independent Functional Status:  Mobility: Bed Mobility Overal bed mobility: Modified Independent General bed mobility comments: increased time to EOB Transfers Overall transfer level: Needs assistance Equipment used: Rolling walker (2 wheeled) Transfers: Sit to/from Stand Sit to Stand: Min assist General transfer  comment: min A to powerup and steady from EOB position.  Ambulation/Gait Ambulation/Gait assistance: Min guard Ambulation Distance (Feet): 125 Feet Assistive device: 1 person hand held assist Gait Pattern/deviations: Step-through pattern, Trunk flexed General Gait Details: slowed gait, low clearance but intact heel-toe pattern; cues to accentuate heel strike Right to emphasize recovery of gait mechanics Gait velocity interpretation: Below normal speed for age/gender    ADL: ADL Overall ADL's : Needs assistance/impaired Eating/Feeding: Set up, Sitting Eating/Feeding Details (indicate cue type and reason): rest breaks likely needed for feeding with RUE. Daughter reports only minimal spillage (Jell-o) during lunch.  Grooming: Minimal assistance, Sitting Grooming Details (indicate cue type and reason): extra time and effor. Min A for overhead grooming tasks Upper Body Bathing: Moderate assistance, Sitting Lower Body Bathing: Moderate assistance, Sit to/from stand Upper Body Dressing : Moderate assistance, Sitting Lower Body Dressing: Moderate assistance, Sit to/from stand Toilet Transfer: Minimal assistance, Ambulation, RW, BSC Toileting- Clothing Manipulation and Hygiene: Moderate assistance, Sit to/from stand Tub/ Shower Transfer: Moderate assistance Functional mobility during ADLs: Minimal assistance, Rolling walker General ADL Comments: Pt completed bed mobility and in-room functional mobility. Oral care standing at sink with items on right side. Some decreased initation. Reaching for blood pressure cuff in bag while looking for  towel to dry off hands.   Cognition: Cognition Overall Cognitive Status: Difficult to assess Arousal/Alertness: Awake/alert Orientation Level: Oriented to person, Oriented to place, Oriented to situation, Disoriented to time Attention: Sustained Sustained Attention: Appears intact Cognition Arousal/Alertness: Awake/alert Behavior During Therapy: Flat  affect, WFL for tasks assessed/performed Overall Cognitive Status: Difficult to assess General Comments: stays engaged in conversation, offers input, answers questions appropriately Difficult to assess due to: Impaired communication  Blood pressure (!) 184/87, pulse 72, temperature (!) 89.7 F (32.1 C), temperature source Oral, resp. rate 20, height 6\' 3"  (1.905 m), weight 111.1 kg (244 lb 14.9 oz), SpO2 99 %. Physical Exam  Nursing note and vitals reviewed. Constitutional: [name] [name] is oriented to person, place, and time. [name] [name] appears well-developed and well-nourished. No distress.  HENT:  Head: Normocephalic and atraumatic.  Mouth/Throat: Oropharynx is clear and moist.  Eyes: Conjunctivae and EOM are normal. Pupils are equal, round, and reactive to light.  Neck: Normal range of motion. Neck supple.  Cardiovascular: Normal rate and regular rhythm.  Respiratory: Effort normal and breath sounds normal. No stridor. No respiratory distress.  GI: Soft. Bowel sounds are normal. [name] [name] exhibits no distension. There is no tenderness.  Musculoskeletal: [name] [name] exhibits no edema or tenderness.  Neurological: [name] [name] is alert and oriented to person, place, and time.  Flat affect. Mild right inattention with right hemiparesis. [name] [name] had difficulty with two step commands. His verbal output limited to one to two words. [name] [name] was able to answer most basic orientation questions accurately. [name] [name] has decreased awareness of deficits. Right hemiparesis 4- out of 5 right upper extremity and 4 out of 5 right lower extremity.  Patient with motor apraxia  Skin: Skin is warm and dry. [name] [name] is not diaphoretic.  Psychiatric: His affect is blunt. His speech is delayed. [name] [name] is slowed. Cognition and memory are impaired. [name] [name] expresses inappropriate judgment.    Results for orders placed or performed during the hospital encounter of 07/08/17 (from the past 24 hour(s))  Glucose, capillary     Status: Abnormal   Collection Time: 07/09/17 11:22 AM  Result  Value Ref Range   Glucose-Capillary 145 (H) 65 - 99 mg/dL  Glucose, capillary     Status: Abnormal   Collection Time: 07/09/17  5:12 PM  Result Value Ref Range   Glucose-Capillary 114 (H) 65 - 99 mg/dL  Glucose, capillary     Status: Abnormal   Collection Time: 07/09/17  8:23 PM  Result Value Ref Range   Glucose-Capillary 164 (H) 65 - 99 mg/dL  Glucose, capillary     Status: Abnormal   Collection Time: 07/10/17 12:05 AM  Result Value Ref Range   Glucose-Capillary 123 (H) 65 - 99 mg/dL  Glucose, capillary     Status: None   Collection Time: 07/10/17  4:47 AM  Result Value Ref Range   Glucose-Capillary 83 65 - 99 mg/dL  Glucose, capillary     Status: Abnormal   Collection Time: 07/10/17  8:11 AM  Result Value Ref Range   Glucose-Capillary 113 (H) 65 - 99 mg/dL   Ct Angio Head W Or Wo Contrast  Result Date: 07/08/2017 CLINICAL DATA:  Expressive aphasia and RIGHT-sided weakness, improving. EXAM: CT ANGIOGRAPHY HEAD AND NECK TECHNIQUE: Multidetector CT imaging of the head and neck was performed using the standard protocol during bolus administration of intravenous contrast. Multiplanar CT image reconstructions and MIPs were obtained to evaluate the vascular anatomy. Carotid stenosis measurements (when applicable) are obtained utilizing NASCET criteria, using the distal internal  carotid diameter as the denominator. CONTRAST:  50 mL ISOVUE-370 IOPAMIDOL (ISOVUE-370) INJECTION 76% COMPARISON:  CT perfusion reported separately. Noncontrast CT head reported earlier. FINDINGS: CTA NECK Aortic arch: Standard branching. Imaged portion shows no evidence of aneurysm or dissection. No significant stenosis of the major arch vessel origins. Right carotid system: Calcified and noncalcified plaque at the bifurcation. No evidence of dissection, stenosis (50% or greater) or occlusion. Left carotid system: Calcified and particularly noncalcified plaque at the bifurcation. Lipid laden soft plaque extends from the  bifurcation nearly 2 cm into cervical ICA, see sagittal image 148 series 14. No evidence of frank dissection, stenosis (50% or greater) or occlusion. Vertebral arteries: BILATERAL patent, LEFT slightly larger. No evidence of dissection, stenosis (50% or greater) or occlusion. Nonvascular soft tissues:  Noncontributory. CTA HEAD Anterior circulation: The skullbase, cavernous, and supraclinoid ICA segments appear widely patent. There is moderate irregularity of the A1 ACA on the LEFT, non worrisome given the dominant RIGHT A1 ACA. No MCA stenosis or occlusion. No significant stenosis, proximal occlusion, aneurysm, or vascular malformation. Posterior circulation: Both vertebral arteries contribute to basilar formation, LEFT dominant. Focal narrowing RIGHT P1 PCA estimated 50%. No significant stenosis, proximal occlusion, aneurysm, or vascular malformation. Venous sinuses: As permitted by contrast timing, patent. Anatomic variants: None of significance. Delayed phase:  Not performed. Review of the MIP images confirms the above findings. IMPRESSION: No extracranial flow reducing stenosis, but lipid laden plaque does extend for almost 2 cm into the LEFT ICA, areas concerning as a source of recurrent/distal emboli. See discussion above. Findings discussed with ordering provider. Non stenotic calcified and non calcified plaque at both bifurcations. No intracranial large vessel occlusion or significant proximal intracranial anterior circulation stenosis. Electronically Signed   By: Staci Righter M.D.   On: 07/08/2017 09:58   Ct Angio Neck W Or Wo Contrast  Result Date: 07/08/2017 CLINICAL DATA:  Expressive aphasia and RIGHT-sided weakness, improving. EXAM: CT ANGIOGRAPHY HEAD AND NECK TECHNIQUE: Multidetector CT imaging of the head and neck was performed using the standard protocol during bolus administration of intravenous contrast. Multiplanar CT image reconstructions and MIPs were obtained to evaluate the vascular  anatomy. Carotid stenosis measurements (when applicable) are obtained utilizing NASCET criteria, using the distal internal carotid diameter as the denominator. CONTRAST:  50 mL ISOVUE-370 IOPAMIDOL (ISOVUE-370) INJECTION 76% COMPARISON:  CT perfusion reported separately. Noncontrast CT head reported earlier. FINDINGS: CTA NECK Aortic arch: Standard branching. Imaged portion shows no evidence of aneurysm or dissection. No significant stenosis of the major arch vessel origins. Right carotid system: Calcified and noncalcified plaque at the bifurcation. No evidence of dissection, stenosis (50% or greater) or occlusion. Left carotid system: Calcified and particularly noncalcified plaque at the bifurcation. Lipid laden soft plaque extends from the bifurcation nearly 2 cm into cervical ICA, see sagittal image 148 series 14. No evidence of frank dissection, stenosis (50% or greater) or occlusion. Vertebral arteries: BILATERAL patent, LEFT slightly larger. No evidence of dissection, stenosis (50% or greater) or occlusion. Nonvascular soft tissues:  Noncontributory. CTA HEAD Anterior circulation: The skullbase, cavernous, and supraclinoid ICA segments appear widely patent. There is moderate irregularity of the A1 ACA on the LEFT, non worrisome given the dominant RIGHT A1 ACA. No MCA stenosis or occlusion. No significant stenosis, proximal occlusion, aneurysm, or vascular malformation. Posterior circulation: Both vertebral arteries contribute to basilar formation, LEFT dominant. Focal narrowing RIGHT P1 PCA estimated 50%. No significant stenosis, proximal occlusion, aneurysm, or vascular malformation. Venous sinuses: As permitted by contrast timing,  patent. Anatomic variants: None of significance. Delayed phase:  Not performed. Review of the MIP images confirms the above findings. IMPRESSION: No extracranial flow reducing stenosis, but lipid laden plaque does extend for almost 2 cm into the LEFT ICA, areas concerning as a  source of recurrent/distal emboli. See discussion above. Findings discussed with ordering provider. Non stenotic calcified and non calcified plaque at both bifurcations. No intracranial large vessel occlusion or significant proximal intracranial anterior circulation stenosis. Electronically Signed   By: Staci Righter M.D.   On: 07/08/2017 09:58   Mr Brain Wo Contrast  Result Date: 07/08/2017 CLINICAL DATA:  Confusion with expressive aphasia. This began earlier today. EXAM: MRI HEAD WITHOUT CONTRAST TECHNIQUE: Multiplanar, multiecho pulse sequences of the brain and surrounding structures were obtained without intravenous contrast. COMPARISON:  CT head without contrast, CT perfusion, and CTA head neck, all performed earlier today. FINDINGS: The patient was unable to remain motionless for the exam. Small or subtle lesions could be overlooked. Brain: Multifocal areas of restricted diffusion, corresponding low ADC, affect the LEFT hemisphere within the LEFT MCA territory. Largest confluent area involves the LEFT frontal cortex and subcortical white matter. Additional smaller areas are seen in the LEFT anterior frontal cortex, LEFT posterior frontal cortex, LEFT superior insular ribbon, and LEFT periventricular white matter. No hemorrhage, mass lesion, hydrocephalus, or extra-axial fluid. Vascular: Flow voids are maintained.  No large vessel occlusion. Skull and upper cervical spine: Normal marrow signal. Sinuses/Orbits: Grossly negative. Other: None. IMPRESSION: Multifocal areas of restricted diffusion consistent with acute nonhemorrhagic infarction affecting the LEFT MCA territory. Largest confluent area affects the LEFT frontal cortex and subcortical white matter.Shower of emboli is suspected. Electronically Signed   By: Staci Righter M.D.   On: 07/08/2017 14:49   Ct Cerebral Perfusion W Contrast  Result Date: 07/08/2017 CLINICAL DATA:  Code stroke. Focal neuro deficit less than 6 hours. Transient ischemic attack  versus hypertensive encephalopathy. Stroke risk factors include hypertension and diabetes. Expressive aphasia and RIGHT-sided weakness, improving exam. Initial systolic blood pressure to 40 is improving. EXAM: CT PERFUSION BRAIN TECHNIQUE: Multiphase CT imaging of the brain was performed following IV bolus contrast injection. Subsequent parametric perfusion maps were calculated using RAPID software. CONTRAST:  50 mL ISOVUE-370 IOPAMIDOL (ISOVUE-370) INJECTION 76% COMPARISON:  Code stroke CT earlier in the day FINDINGS: CT Brain Perfusion Findings: CBF (<30%) Volume: 54mL Perfusion (Tmax>6.0s) volume: 43mL Mismatch Volume: 26mL Mismatch location:  LEFT posterior frontal cortex. IMPRESSION: No core infarct is demonstrated. CBF (< 30%) volume: 0 mL. There is a small area of perfusion mismatch LEFT posterior frontal cortex. Electronically Signed   By: Staci Righter M.D.   On: 07/08/2017 09:36   Ct Head Code Stroke Wo Contrast  Result Date: 07/08/2017 CLINICAL DATA:  Code stroke.  RIGHT-sided weakness with  aphasia. EXAM: CT HEAD WITHOUT CONTRAST TECHNIQUE: Contiguous axial images were obtained from the base of the skull through the vertex without intravenous contrast. COMPARISON:  None. FINDINGS: Brain: No evidence of acute infarction, hemorrhage, hydrocephalus, extra-axial collection or mass lesion/mass effect. Normal for age cerebral volume. Mild hypoattenuation of the white matter, possible small vessel disease. Vascular: No hyperdense vessel or unexpected calcification. Skull: Normal. Negative for fracture or focal lesion. Sinuses/Orbits: No acute finding. Other: None. ASPECTS [facility] Stroke Program Early CT Score) - Ganglionic level infarction (caudate, lentiform nuclei, internal capsule, insula, M1-M3 cortex): 7 - Supraganglionic infarction (M4-M6 cortex): 3 Total score (0-10 with 10 being normal): 10 IMPRESSION: 1. Negative exam 2. ASPECTS is 10.  These results were communicated to Dr. Lorraine Lax At 8:59 amon  1/5/2019by text page via the [facility] : [facility] messaging system. Electronically Signed   By: Staci Righter M.D.   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 Does the need for close, 24 hr/day medical supervision in concert with the patient's rehab needs make it unreasonable for this patient to be served in a less intensive setting? Yes 2. Co-Morbidities requiring supervision/potential complications: Diabetes type 2, hypertension, gout 3. Due to bladder management, bowel management, safety, skin/wound care, disease management, medication administration, pain management and patient education, does the patient require 24 hr/day rehab nursing? Yes 4. Does the patient require coordinated care of a physician, rehab nurse, PT (1-2 hrs/day, 5 days/week), OT (1-2 hrs/day, 5 days/week) and SLP (1-2 hrs/day, 5 days/week) to address physical and functional deficits in the context of the above medical diagnosis(es)? Yes Addressing deficits in the following areas: balance, endurance, locomotion, strength, transferring, bowel/bladder control, bathing, dressing, feeding, grooming, toileting, cognition, speech, language and psychosocial support 5. Can the patient actively participate in an intensive therapy program of at least 3 hrs of therapy per day at least 5 days per week? Yes 6. The potential for patient to make measurable gains while on inpatient rehab is excellent 7. Anticipated functional outcomes upon discharge from inpatient rehab are modified independent  with PT, modified independent with OT, modified independent, supervision and min assist with SLP. 8. Estimated rehab length of stay to reach the above functional goals is: 11-15 days 9. Anticipated D/C setting: Home 10. Anticipated post D/C treatments: HH therapy and Outpatient therapy 11. Overall Rehab/Functional Prognosis: excellent  RECOMMENDATIONS: This patient's condition is  appropriate for continued rehabilitative care in the following setting: CIR Patient has agreed to participate in recommended program. Yes Note that insurance prior authorization may be required for reimbursement for recommended care.  Comment: Rehab Admissions Coordinator to follow up.  Thanks,  Meredith Staggers, MD, Mellody Drown    Bary Leriche, PA-C 07/10/2017

## 2017-07-10 NOTE — Telephone Encounter (Signed)
Patient seen in ED and currently admitted as directed.

## 2017-07-10 NOTE — Progress Notes (Addendum)
Inpatient Diabetes Program Recommendations  AACE/ADA: New Consensus Statement on Inpatient Glycemic Control (2015)  Target Ranges:  Prepandial:   less than 140 mg/dL      Peak postprandial:   less than 180 mg/dL (1-2 hours)      Critically ill patients:  140 - 180 mg/dL   Lab Results  Component Value Date   GLUCAP 120 (H) 07/10/2017   HGBA1C 8.1 (H) 07/09/2017    Review of Glycemic ControlResults for [name] [name], [name] [name] (MRN [id number]) as of 07/10/2017 13:33  Ref. Range 07/09/2017 20:23 07/10/2017 00:05 07/10/2017 04:47 07/10/2017 08:11 07/10/2017 11:46  Glucose-Capillary Latest Ref Range: 65 - 99 mg/dL 164 (H) 123 (H) 83 113 (H) 120 (H)    Diabetes history: Type 2 DM Outpatient Diabetes medications: Novolog 12 units tid with meals, Lantus 25 units daily Current orders for Inpatient glycemic control:  Lantus 25 units daily  Inpatient Diabetes Program Recommendations:    Note blood sugars currently well controlled with only Lantus.  May consider adding Novolog sensitive correction tid with meals and HS for blood sugars>120 mg/dL.  Will talk with patient regarding home glycemic control.  Last outpatient visit for DM was in 06/2017 and adjustments were made to medications.    Thanks,  Adah Perl, RN, BC-ADM Inpatient Diabetes Coordinator Pager [phone] ([phone] Spoke with patient and family regarding DM.  Verified home medications.  Patient admits that he has not been eating as well over the holidays.  Blood sugars currently well controlled.  Briefly discussed CHO and portion control.  Wife states she is trying to get "my fitness Pal" app to help her with recipe and nutrition ideas for meal planning.  May benefit from outpatient DM education after d/c.  Will follow.

## 2017-07-10 NOTE — Progress Notes (Signed)
    [facility] has been requested to perform a transesophageal echocardiogram on [name] [name] for 07/11/2017.  After careful review of history and examination, the risks and benefits of transesophageal echocardiogram have been explained including risks of esophageal damage, perforation (1:10,000 risk), bleeding, pharyngeal hematoma as well as other potential complications associated with conscious sedation including aspiration, arrhythmia, respiratory failure and death. Alternatives to treatment were discussed, questions were answered. Discussed with pt's daughter at bedside and is willing to proceed. I have reviewed labs and vital signs.  Kathyrn Drown, NP  07/10/2017 4:38 PM

## 2017-07-11 ENCOUNTER — Encounter (HOSPITAL_COMMUNITY): Payer: Self-pay | Admitting: *Deleted

## 2017-07-11 ENCOUNTER — Encounter (HOSPITAL_COMMUNITY): Payer: Self-pay | Admitting: Internal Medicine

## 2017-07-11 ENCOUNTER — Inpatient Hospital Stay (HOSPITAL_COMMUNITY): Payer: Medicare Other

## 2017-07-11 ENCOUNTER — Inpatient Hospital Stay (HOSPITAL_COMMUNITY)
Admission: RE | Admit: 2017-07-11 | Discharge: 2017-07-16 | DRG: 057 | Disposition: A | Discharge status: Home / Self Care | Payer: Medicare Other | Source: Intra-hospital | Attending: Physical Medicine & Rehabilitation | Admitting: Physical Medicine & Rehabilitation

## 2017-07-11 ENCOUNTER — Other Ambulatory Visit: Payer: Self-pay

## 2017-07-11 ENCOUNTER — Encounter (HOSPITAL_COMMUNITY)
Admission: EM | Disposition: A | Discharge status: Rehabilitation Facility | Payer: Self-pay | Source: Home / Self Care | Attending: Internal Medicine

## 2017-07-11 DIAGNOSIS — I6522 Occlusion and stenosis of left carotid artery: Secondary | ICD-10-CM | POA: Diagnosis present

## 2017-07-11 DIAGNOSIS — E1169 Type 2 diabetes mellitus with other specified complication: Secondary | ICD-10-CM | POA: Diagnosis not present

## 2017-07-11 DIAGNOSIS — Z794 Long term (current) use of insulin: Secondary | ICD-10-CM

## 2017-07-11 DIAGNOSIS — Z833 Family history of diabetes mellitus: Secondary | ICD-10-CM

## 2017-07-11 DIAGNOSIS — I69320 Aphasia following cerebral infarction: Secondary | ICD-10-CM

## 2017-07-11 DIAGNOSIS — I10 Essential (primary) hypertension: Secondary | ICD-10-CM

## 2017-07-11 DIAGNOSIS — E119 Type 2 diabetes mellitus without complications: Secondary | ICD-10-CM

## 2017-07-11 DIAGNOSIS — I169 Hypertensive crisis, unspecified: Secondary | ICD-10-CM | POA: Diagnosis present

## 2017-07-11 DIAGNOSIS — E1122 Type 2 diabetes mellitus with diabetic chronic kidney disease: Secondary | ICD-10-CM | POA: Diagnosis present

## 2017-07-11 DIAGNOSIS — I69390 Apraxia following cerebral infarction: Secondary | ICD-10-CM | POA: Diagnosis not present

## 2017-07-11 DIAGNOSIS — N183 Chronic kidney disease, stage 3 unspecified: Secondary | ICD-10-CM

## 2017-07-11 DIAGNOSIS — I160 Hypertensive urgency: Secondary | ICD-10-CM

## 2017-07-11 DIAGNOSIS — E785 Hyperlipidemia, unspecified: Secondary | ICD-10-CM | POA: Diagnosis present

## 2017-07-11 DIAGNOSIS — Z6830 Body mass index (BMI) 30.0-30.9, adult: Secondary | ICD-10-CM | POA: Diagnosis not present

## 2017-07-11 DIAGNOSIS — Z882 Allergy status to sulfonamides status: Secondary | ICD-10-CM | POA: Diagnosis not present

## 2017-07-11 DIAGNOSIS — I6389 Other cerebral infarction: Secondary | ICD-10-CM

## 2017-07-11 DIAGNOSIS — I63512 Cerebral infarction due to unspecified occlusion or stenosis of left middle cerebral artery: Secondary | ICD-10-CM | POA: Diagnosis not present

## 2017-07-11 DIAGNOSIS — I69351 Hemiplegia and hemiparesis following cerebral infarction affecting right dominant side: Principal | ICD-10-CM

## 2017-07-11 DIAGNOSIS — Z823 Family history of stroke: Secondary | ICD-10-CM | POA: Diagnosis not present

## 2017-07-11 DIAGNOSIS — Z8249 Family history of ischemic heart disease and other diseases of the circulatory system: Secondary | ICD-10-CM | POA: Diagnosis not present

## 2017-07-11 DIAGNOSIS — I129 Hypertensive chronic kidney disease with stage 1 through stage 4 chronic kidney disease, or unspecified chronic kidney disease: Secondary | ICD-10-CM | POA: Diagnosis present

## 2017-07-11 DIAGNOSIS — E11649 Type 2 diabetes mellitus with hypoglycemia without coma: Secondary | ICD-10-CM | POA: Diagnosis present

## 2017-07-11 DIAGNOSIS — I361 Nonrheumatic tricuspid (valve) insufficiency: Secondary | ICD-10-CM

## 2017-07-11 DIAGNOSIS — D62 Acute posthemorrhagic anemia: Secondary | ICD-10-CM | POA: Diagnosis present

## 2017-07-11 DIAGNOSIS — E876 Hypokalemia: Secondary | ICD-10-CM

## 2017-07-11 DIAGNOSIS — E162 Hypoglycemia, unspecified: Secondary | ICD-10-CM

## 2017-07-11 DIAGNOSIS — E669 Obesity, unspecified: Secondary | ICD-10-CM | POA: Diagnosis present

## 2017-07-11 HISTORY — PX: LOOP RECORDER INSERTION: EP1214

## 2017-07-11 HISTORY — PX: TEE WITHOUT CARDIOVERSION: SHX5443

## 2017-07-11 LAB — GLUCOSE, CAPILLARY
GLUCOSE-CAPILLARY: 96 mg/dL (ref 65–99)
Glucose-Capillary: 96 mg/dL (ref 65–99)
Glucose-Capillary: 187 mg/dL — ABNORMAL HIGH (ref 65–99)

## 2017-07-11 SURGERY — ECHOCARDIOGRAM, TRANSESOPHAGEAL
Anesthesia: Moderate Sedation

## 2017-07-11 SURGERY — LOOP RECORDER INSERTION

## 2017-07-11 MED ORDER — NATEGLINIDE 120 MG PO TABS: 120.0000 mg | ORAL_TABLET | Freq: Three times a day (TID) | ORAL | Status: DC

## 2017-07-11 MED ORDER — INSULIN GLARGINE 100 UNIT/ML ~~LOC~~ SOLN
25.0000 [IU] | Freq: Every day | SUBCUTANEOUS | Status: DC
  Filled 2017-07-11: qty 0.25

## 2017-07-11 MED ORDER — LIDOCAINE-EPINEPHRINE 1 %-1:100000 IJ SOLN
INTRAMUSCULAR | Status: DC | PRN
  Administered 2017-07-11: 30 mL

## 2017-07-11 MED ORDER — SENNOSIDES-DOCUSATE SODIUM 8.6-50 MG PO TABS: 1.0000 | ORAL_TABLET | Freq: Every evening | ORAL | Status: DC | PRN

## 2017-07-11 MED ORDER — MIDAZOLAM HCL 10 MG/2ML IJ SOLN
INTRAMUSCULAR | Status: DC | PRN
  Administered 2017-07-11: 2 mg via INTRAVENOUS
  Administered 2017-07-11: 1 mg via INTRAVENOUS
  Administered 2017-07-11: 2 mg via INTRAVENOUS

## 2017-07-11 MED ORDER — METOPROLOL TARTRATE 5 MG/5ML IV SOLN
INTRAVENOUS | Status: DC | PRN
  Administered 2017-07-11: 5 mg via INTRAVENOUS

## 2017-07-11 MED ORDER — ASPIRIN EC 325 MG PO TBEC: 325.0000 mg | DELAYED_RELEASE_TABLET | Freq: Every day | ORAL | Status: DC

## 2017-07-11 MED ORDER — HYDRALAZINE HCL 20 MG/ML IJ SOLN
INTRAMUSCULAR | Status: AC
  Filled 2017-07-11: qty 1

## 2017-07-11 MED ORDER — EZETIMIBE 10 MG PO TABS: 10.0000 mg | ORAL_TABLET | Freq: Every day | ORAL | Status: DC

## 2017-07-11 MED ORDER — MIDAZOLAM HCL 5 MG/ML IJ SOLN
INTRAMUSCULAR | Status: AC
  Filled 2017-07-11: qty 2

## 2017-07-11 MED ORDER — ASPIRIN 325 MG PO TBEC: 325.0000 mg | DELAYED_RELEASE_TABLET | Freq: Every day | ORAL | 0 refills | Status: DC

## 2017-07-11 MED ORDER — METOPROLOL TARTRATE 5 MG/5ML IV SOLN
INTRAVENOUS | Status: AC
  Filled 2017-07-11: qty 5

## 2017-07-11 MED ORDER — EZETIMIBE 10 MG PO TABS
10.0000 mg | ORAL_TABLET | Freq: Every day | ORAL | Status: DC
  Administered 2017-07-12 – 2017-07-16 (×5): 10 mg via ORAL
  Filled 2017-07-11 (×5): qty 1

## 2017-07-11 MED ORDER — FENTANYL CITRATE (PF) 100 MCG/2ML IJ SOLN
INTRAMUSCULAR | Status: DC | PRN
  Administered 2017-07-11 (×2): 25 ug via INTRAVENOUS

## 2017-07-11 MED ORDER — ARTIFICIAL TEARS OPHTHALMIC OINT
TOPICAL_OINTMENT | OPHTHALMIC | Status: DC | PRN
  Filled 2017-07-11: qty 3.5

## 2017-07-11 MED ORDER — HYDRALAZINE HCL 20 MG/ML IJ SOLN
INTRAMUSCULAR | Status: DC | PRN
  Administered 2017-07-11 (×2): 10 mg via INTRAVENOUS

## 2017-07-11 MED ORDER — HYDRALAZINE HCL 25 MG PO TABS
25.0000 mg | ORAL_TABLET | Freq: Three times a day (TID) | ORAL | Status: DC
  Administered 2017-07-11 – 2017-07-12 (×2): 25 mg via ORAL
  Filled 2017-07-11 (×2): qty 1

## 2017-07-11 MED ORDER — CALCITRIOL 0.25 MCG PO CAPS
0.2500 ug | ORAL_CAPSULE | Freq: Every day | ORAL | Status: DC
  Administered 2017-07-11 – 2017-07-16 (×6): 0.25 ug via ORAL
  Filled 2017-07-11 (×7): qty 1

## 2017-07-11 MED ORDER — HYDRALAZINE HCL 25 MG PO TABS
25.0000 mg | ORAL_TABLET | Freq: Three times a day (TID) | ORAL | Status: DC
  Administered 2017-07-11: 25 mg via ORAL
  Filled 2017-07-11: qty 1

## 2017-07-11 MED ORDER — LIDOCAINE-EPINEPHRINE 1 %-1:100000 IJ SOLN
INTRAMUSCULAR | Status: AC
  Filled 2017-07-11: qty 1

## 2017-07-11 MED ORDER — INSULIN GLARGINE 100 UNIT/ML ~~LOC~~ SOLN
25.0000 [IU] | Freq: Every day | SUBCUTANEOUS | Status: DC
  Administered 2017-07-11 – 2017-07-12 (×2): 25 [IU] via SUBCUTANEOUS
  Filled 2017-07-11 (×3): qty 0.25

## 2017-07-11 MED ORDER — BUTAMBEN-TETRACAINE-BENZOCAINE 2-2-14 % EX AERO
INHALATION_SPRAY | CUTANEOUS | Status: DC | PRN
  Administered 2017-07-11: 2 via TOPICAL

## 2017-07-11 MED ORDER — CYCLOBENZAPRINE HCL 5 MG PO TABS: 5.0000 mg | ORAL_TABLET | Freq: Three times a day (TID) | ORAL | Status: DC | PRN

## 2017-07-11 MED ORDER — CYCLOBENZAPRINE HCL 10 MG PO TABS: 10.0000 mg | ORAL_TABLET | Freq: Three times a day (TID) | ORAL | Status: DC | PRN

## 2017-07-11 MED ORDER — SODIUM CHLORIDE 0.9 % IV SOLN
INTRAVENOUS | Status: DC
  Administered 2017-07-11: 13:00:00 via INTRAVENOUS

## 2017-07-11 MED ORDER — ACETAMINOPHEN 325 MG PO TABS: 650.0000 mg | ORAL_TABLET | ORAL | Status: DC | PRN

## 2017-07-11 MED ORDER — HYDRALAZINE HCL 20 MG/ML IJ SOLN: 10.0000 mg | INTRAMUSCULAR | Status: DC | PRN

## 2017-07-11 MED ORDER — ASPIRIN EC 325 MG PO TBEC
325.0000 mg | DELAYED_RELEASE_TABLET | Freq: Every day | ORAL | Status: DC
  Administered 2017-07-12 – 2017-07-16 (×5): 325 mg via ORAL
  Filled 2017-07-11 (×5): qty 1

## 2017-07-11 MED ORDER — ACETAMINOPHEN 650 MG RE SUPP: 650.0000 mg | RECTAL | Status: DC | PRN

## 2017-07-11 MED ORDER — ACETAMINOPHEN 160 MG/5ML PO SOLN: 650.0000 mg | ORAL | Status: DC | PRN

## 2017-07-11 MED ORDER — FENTANYL CITRATE (PF) 100 MCG/2ML IJ SOLN
INTRAMUSCULAR | Status: AC
Start: 2017-07-11 — End: 2017-07-11
  Filled 2017-07-11: qty 2

## 2017-07-11 SURGICAL SUPPLY — 2 items
LOOP REVEAL LINQSYS (Prosthesis & Implant Heart) ×3 IMPLANT
PACK LOOP INSERTION (CUSTOM PROCEDURE TRAY) ×3 IMPLANT

## 2017-07-11 NOTE — H&P (Signed)
Physical Medicine and Rehabilitation Admission H&P       Chief Complaint  Patient presents with  . Functional deficits due to stroke   HPI: [name] Beckel Bordersis a 72 y.o.malewith history of HTN, T2DM, who was admitted on 07/08/17 with difficulty speaking and mild right sided weakness. BP elevated in ED and CT head negative of for hemorrhage. CTA/Perfusion brain showed lipid laden plaques extending almost 2 cm into L-ICA concerning for recurrent/distal emboli. MRI brains showed multifocal areas of restricted diffusion in L-MCA territory--larges in left frontal cortex and subcortical white matter felt to be embolic. 2D echo showed EF 55-60% with no wall abnormality. Carotid dopplers without significant ICA stenosis.   On ASA for secondary stroke prevention and Vascular consult recommended for L-ICA plaque.  TEE done today and loop recorder placed for work up  Speech therapy evaluation revealed expressive > receptive aphasia probably due to verbal apraxia.Patient limited by right sided weakness with inattention, difficulty processing higher levels commands, apraxia and aphasia.Therapy evaluations completed yesterday and CIR recommended due to functional deficits.   Review of Systems  Constitutional: Negative for fever.  HENT: Negative for hearing loss.   Eyes: Negative for blurred vision.  Respiratory: Negative for cough and shortness of breath.   Cardiovascular: Negative for chest pain and leg swelling.  Gastrointestinal: Negative for nausea.  Genitourinary: Negative for dysuria, frequency and urgency.  Musculoskeletal: Negative for back pain and myalgias.  Skin: Negative for rash.  Neurological: Positive for focal weakness. Negative for dizziness.  Psychiatric/Behavioral: Negative for depression. The patient does not have insomnia.           Past Medical History:  Diagnosis Date  . DIABETES MELLITUS, TYPE II 01/02/2007  . HYPERLIPIDEMIA 06/18/2007  . HYPERTENSION  01/02/2007  . RENAL INSUFFICIENCY 02/05/2008         Past Surgical History:  Procedure Laterality Date  . SHOULDER SURGERY     left         Family History  Problem Relation Age of Onset  . Hypertension Mother   . Diabetes Mother   . Stroke Father   . Stroke Maternal Grandmother     Social History:  Married. Retired Agricultural consultant. Wife at home and can provide supervision after discharge. Hereports that has never smoked. he has never used smokeless tobacco. He drinks alcohol on rare occasions or wine. hereports that he does use drugs.        Allergies  Allergen Reactions  . Bactrim [Sulfamethoxazole-Trimethoprim] Hives, Itching and Swelling  . Penicillins     Has patient had a PCN reaction causing immediate rash, facial/tongue/throat swelling, SOB or lightheadedness with hypotension: Unk Has patient had a PCN reaction causing severe rash involving mucus membranes or skin necrosis: Unk Has patient had a PCN reaction that required hospitalization: Unk Has patient had a PCN reaction occurring within the last 10 years: No If all of the above answers are "NO", then may proceed with Cephalosporin use.  . Statins     myalgia  . Sulfa Drugs Cross Reactors Hives, Itching and Swelling          Medications Prior to Admission  Medication Sig Dispense Refill  . amLODipine (NORVASC) 10 MG tablet Take 1 tablet (10 mg total) by mouth daily. 30 tablet 0  . benazepril (LOTENSIN) 40 MG tablet Take 1 tablet (40 mg total) by mouth 2 (two) times daily. 60 tablet 0  . furosemide (LASIX) 80 MG tablet 160 mg po q a.m and 80 mg  po q afternoon 270 tablet 3  . hydrALAZINE (APRESOLINE) 25 MG tablet Take 1 tablet (25 mg total) by mouth 3 (three) times daily. 90 tablet 0  . insulin aspart (NOVOLOG) 100 UNIT/ML injection Take 6 units with every meal unless blood sugar above 200 than take 10 units (Patient taking differently: Inject 12 Units into the skin 3 (three) times daily with meals.  ) 3 vial 11  . Insulin Glargine (LANTUS SOLOSTAR) 100 UNIT/ML Solostar Pen INJECT 20 UNITS SUBCUTANEOUSLY ONCE DAILY. NEED TO MAKE APPOINTMENT. (Patient taking differently: Inject 25 Units into the skin daily at 10 pm. ) 2 pen 0  . labetalol (NORMODYNE) 200 MG tablet Take 1 tablet (200 mg total) by mouth 2 (two) times daily. 180 tablet 0  . calcitRIOL (ROCALTROL) 0.25 MCG capsule Take 1 capsule by mouth daily.    . cyclobenzaprine (FLEXERIL) 10 MG tablet Take 1 tablet (10 mg total) by mouth 3 (three) times daily as needed for muscle spasms. 30 tablet 0  . fluvastatin (LESCOL) 40 MG capsule Take 1 capsule (40 mg total) by mouth at bedtime. (Patient not taking: Reported on 07/08/2017) 30 capsule 11  . glucose blood (ONETOUCH VERIO) test strip 1 each by Other route daily. Use as instructed (Patient not taking: Reported on 07/08/2017) 100 each 11  . Insulin Pen Needle (B-D UF III MINI PEN NEEDLES) 31G X 5 MM MISC USE AS DIRECTED (Patient not taking: Reported on 07/08/2017) 100 each 11  . nateglinide (STARLIX) 120 MG tablet Take 1 tablet (120 mg total) by mouth 3 (three) times daily with meals. (Patient not taking: Reported on 07/08/2017) 90 tablet 6  . potassium chloride SA (K-DUR,KLOR-CON) 20 MEQ tablet Take 1 tablet by mouth daily.      Drug Regimen Review  Drug regimen was reviewed and remains appropriate with no significant issues identified  Home: Home Living Family/patient expects to be discharged to:: Private residence Living Arrangements: Spouse/significant other Available Help at Discharge: Family Type of Home: House Home Access: Stairs to enter Technical brewer of Steps: 2 Home Layout: Two level, Able to live on main level with bedroom/bathroom  Lives With: Spouse   Functional History: Prior Function Level of Independence: Independent  Functional Status:  Mobility: Bed Mobility Overal bed mobility: Needs Assistance Bed Mobility: Rolling, Sidelying to Sit Rolling:  Supervision Sidelying to sit: Supervision General bed mobility comments: increased time, definite use of bed rail, no physical assist. Transfers Overall transfer level: Needs assistance Equipment used: None Transfers: Sit to/from Stand Sit to Stand: Min assist General transfer comment: minA for safety Ambulation/Gait Ambulation/Gait assistance: Min assist Ambulation Distance (Feet): 250 Feet Assistive device: None(held onto gait belt) Gait Pattern/deviations: Step-through pattern, Drifts right/left General Gait Details: pt hovers to R side and consistantly near misses objects on the right side. Pt unable to navigate the hallways with simple R/L directions Gait velocity: slow Gait velocity interpretation: Below normal speed for age/gender Stairs: Yes Stairs assistance: Min assist Stair Management: One rail Right, Alternating pattern Number of Stairs: 3 General stair comments: pt with trip up stairs due to inability to clear R foot requiring minA to maintain balance  ADL: ADL Overall ADL's : Needs assistance/impaired Eating/Feeding: Set up, Sitting Eating/Feeding Details (indicate cue type and reason): rest breaks likely needed for feeding with RUE. Daughter reports only minimal spillage (Jell-o) during lunch.  Grooming: Set up, Wash/dry face Grooming Details (indicate cue type and reason): setup assist provided as Pt with watering eyes; issued built up handle and  educated Pt/[name] on it's uses to increase independence during self-feeding and grooming tasks  Upper Body Bathing: Moderate assistance, Sitting Lower Body Bathing: Moderate assistance, Sit to/from stand Upper Body Dressing : Moderate assistance, Sitting Lower Body Dressing: Moderate assistance, Sit to/from stand Toilet Transfer: Minimal assistance, Ambulation, RW, BSC Toileting- Clothing Manipulation and Hygiene: Moderate assistance, Sit to/from stand Tub/ Shower Transfer: Moderate assistance Functional mobility  during ADLs: Minimal assistance, Rolling walker General ADL Comments: Pt with increased BP this afternoon (212/103) therefore OOB activity deferred at this time. Pt having increasingly watering eyes, provided washcloth for bed level grooming; issued built up handle for ADL completion and education provided on its uses; Pt participated in [facility] activities with minA for completion;  Cognition: Cognition Overall Cognitive Status: Impaired/Different from baseline Arousal/Alertness: Awake/alert Orientation Level: Oriented X4 Attention: Sustained Sustained Attention: Appears intact Cognition Arousal/Alertness: Awake/alert Behavior During Therapy: Flat affect Overall Cognitive Status: Impaired/Different from baseline Area of Impairment: Following commands, Problem solving Following Commands: Follows multi-step commands inconsistently, Follows one step commands with increased time Problem Solving: Slow processing, Difficulty sequencing, Requires verbal cues, Requires tactile cues General Comments: Pt initially not able to state DOB, able to state his age with increased time; Upon exiting and returning to room Pt able to state his DOB with increased time/effort, with min assist for word finding when stating the year  Difficult to assess due to: Impaired communication   Blood pressure (!) 186/76, pulse 78, temperature 99.9 F (37.7 C), temperature source Oral, resp. rate (!) 24, height 6\' 3"  (1.905 m), weight 111.1 kg (244 lb 14.9 oz), SpO2 96 %. Physical Exam  Constitutional: He is oriented to person, place, and time. He appears well-developed and well-nourished.  HENT:  Head: Normocephalic and atraumatic.  Mouth/Throat: Oropharynx is clear and moist.  Eyes: Conjunctivae and EOM are normal. Pupils are equal, round, and reactive to light.  Neck: Normal range of motion. Neck supple. No tracheal deviation present. No thyromegaly present.  Cardiovascular: Normal rate. Exam reveals no friction  rub.  No murmur heard. Respiratory: Effort normal and breath sounds normal. No respiratory distress. He has no wheezes. He has no rales.  GI: Soft. Bowel sounds are normal. He exhibits no distension. There is no tenderness.  Musculoskeletal: He exhibits no edema.  Neurological: He is alert and oriented to person, place, and time.  Alert and oriented to place, person. A little sedated still from recent procedure. Right central 7 and tongue deviation. Speech dysarthric. Ongoing word finding deficits. Followed all one step commands occasionally with delay.  Sensation grossly intact in all 4 to LT and pain. RUE: 3 to 3+ prox to distal. RLE: 3/5 hf, ke and 4/5 adf/pf. LUE and LLE: 4+ to 5/5.   Skin: Skin is warm and dry.  Psychiatric: His affect is blunt. He is slowed. Cognition and memory are impaired.  Flat, a little sedated    LabResultsLast48Hours  Results for orders placed or performed during the hospital encounter of 07/08/17 (from the past 48 hour(s))  Glucose, capillary     Status: Abnormal   Collection Time: 07/09/17  5:12 PM  Result Value Ref Range   Glucose-Capillary 114 (H) 65 - 99 mg/dL  Glucose, capillary     Status: Abnormal   Collection Time: 07/09/17  8:23 PM  Result Value Ref Range   Glucose-Capillary 164 (H) 65 - 99 mg/dL  Glucose, capillary     Status: Abnormal   Collection Time: 07/10/17 12:05 AM  Result Value Ref  Range   Glucose-Capillary 123 (H) 65 - 99 mg/dL  Glucose, capillary     Status: None   Collection Time: 07/10/17  4:47 AM  Result Value Ref Range   Glucose-Capillary 83 65 - 99 mg/dL  Glucose, capillary     Status: Abnormal   Collection Time: 07/10/17  8:11 AM  Result Value Ref Range   Glucose-Capillary 113 (H) 65 - 99 mg/dL  Glucose, capillary     Status: Abnormal   Collection Time: 07/10/17 11:46 AM  Result Value Ref Range   Glucose-Capillary 120 (H) 65 - 99 mg/dL  Glucose, capillary     Status: Abnormal   Collection Time: 07/10/17   4:07 PM  Result Value Ref Range   Glucose-Capillary 118 (H) 65 - 99 mg/dL  Glucose, capillary     Status: None   Collection Time: 07/11/17  3:44 AM  Result Value Ref Range   Glucose-Capillary 96 65 - 99 mg/dL  Glucose, capillary     Status: None   Collection Time: 07/11/17  6:23 AM  Result Value Ref Range   Glucose-Capillary 96 65 - 99 mg/dL     ImagingResults(Last48hours)  No results found.       Medical Problem List and Plan: 1.  Right hemparesis and aphasia secondary to left MCA infarct             -admit to inpatient rehab 2.  DVT Prophylaxis/Anticoagulation: Pharmaceutical: Lovenox 3. Pain Management: N/A 4. Mood: LCSW to follow for evaluation and support.  5. Neuropsych: This patient is not fully capable of making decisions on his own behalf. 6. Skin/Wound Care: Pressure relief measures. Maintain adequate hydration and nutritional status.  7. Fluids/Electrolytes/Nutrition: Monitor I/O. Will check lytes in am.  8. HTN: Monitor BP bid with permissive HTN. Used Lotensin bid, Norvasc, lasix and hydralazine tid at home.  Continue low dose hydralazine for now.  9. CKD stage III: Followed by Dr. Jimmy Footman.  Monitor lytes serially. Resume calcitriol.  10. T2DM: Hgb A1C-8.1.  Monitor BS ac/hs. Continue lantus --resume starlix additionally.   11. Dyslipidemia: Unable to tolerate statins. Now on Zetia.  12. Left ICA plaque: Follow up with vascular after discharge.     Post Admission Physician Evaluation: 1. Functional deficits secondary  to left MCA infarct. 2. Patient is admitted to receive collaborative, interdisciplinary care between the physiatrist, rehab nursing staff, and therapy team. 3. Patient's level of medical complexity and substantial therapy needs in context of that medical necessity cannot be provided at a lesser intensity of care such as a SNF. 4. Patient has experienced substantial functional loss from his/her baseline which was documented above  under the "Functional History" and "Functional Status" headings.  Judging by the patient's diagnosis, physical exam, and functional history, the patient has potential for functional progress which will result in measurable gains while on inpatient rehab.  These gains will be of substantial and practical use upon discharge  in facilitating mobility and self-care at the household level. 5. Physiatrist will provide 24 hour management of medical needs as well as oversight of the therapy plan/treatment and provide guidance as appropriate regarding the interaction of the two. 6. The Preadmission Screening has been reviewed and patient status is unchanged unless otherwise stated above. 7. 24 hour rehab nursing will assist with bladder management, bowel management, safety, skin/wound care, disease management, medication administration, pain management and patient education  and help integrate therapy concepts, techniques,education, etc. 8. PT will assess and treat for/with: Lower extremity strength, range of  motion, stamina, balance, functional mobility, safety, adaptive techniques and equipment, NMR, family ed.   Goals are: mod I. 9. OT will assess and treat for/with: ADL's, functional mobility, safety, upper extremity strength, adaptive techniques and equipment, NMR, family ed.   Goals are: mod I to supervision. Therapy may proceed with showering this patient. 10. SLP will assess and treat for/with: cognition, language, communication, family ed.  Goals are: mod I to supervision/min assist. 11. Case Management and Social Worker will assess and treat for psychological issues and discharge planning. 12. Team conference will be held weekly to assess progress toward goals and to determine barriers to discharge. 13. Patient will receive at least 3 hours of therapy per day at least 5 days per week. 14. ELOS: 7-10 days       15. Prognosis:  excellent     Meredith Staggers, MD, [facility] 07/11/2017  Bary Leriche, PA-C 07/11/2017

## 2017-07-11 NOTE — Discharge Summary (Addendum)
Physician Discharge Summary  Tylek Boney Grissett HQI:[id number] DOB: [birth year] DOA: 07/08/2017  PCP: Dorothyann Peng, NP  Admit date: 07/08/2017 Discharge date: 07/11/2017  Time spent: 25 minutes  Recommendations for Outpatient Follow-up:  1. Asa 325 daily  2. going to cir 3. Follow ILR implanted  Discharge Diagnoses:  Active Problems:   Aphasia   CVA (cerebral vascular accident) ([place])   Acute embolic stroke [facility])   Discharge Condition: improved  Diet recommendation: hh dm  Filed Weights   07/08/17 0940 07/08/17 1436  Weight: 111.1 kg (245 lb) 111.1 kg (244 lb 14.9 oz)    History of present illness:  38 male DM tyII diag 2004 + CKD stage III, hypertension, TURP s/p revision 2012, HLD ,gout hyperlipidemia came to the hospital with complains of confusion and expressive aphasia.  CT of the head and CT of the head and neck did not show any acute abnormality but MRI of the brain showed multifocal area of nonhemorrhagic infarction affecting left MCA territory    Hospital Course:  Multifocal acute CVA and left MCA territory Mild confusion and expressive aphasia -CT of the head and CTA head and neck is negative but the MRI of the brain shows left MCA territory acute infarction, largest confluent area affects the left frontal cortex and subcortical white matter -Carotids neg echocardiogram EF 55-60%, carotid S doesn't confirm presence of plaque on CTa-defer to neuro planning re: this -Continue telemetry monitoring, neurology following next hemoglobin A1c is 8.1 and LDL is 212 - Blood sugars fairly well controlled here, continue daily aspirin 325 -He is allergic to statin, will use Zetia 10mg  po instead   Diabetes mellitus type 2-insulin-dependent -His blood sugars are relatively in a good range on current regimen of home Lantus -Hemoglobin A1c is 8.1 cbg 83-113 -use Short acting at rehab on d/c  Essential hypertension -Currently permissive hypertension, resume dhome med son d/c - rx  IV medication if his systolic is greater than 132 or diastolic greater than 440  Hyperlipidemia  -Elevated LDL, intolerant to statin due to muscle cramping.  Will use Zetia 10 mg daily  CKD stage III, baseline Creat 1.7 to 1.8 - Secondary to uncontrolled diabetes and hypertension -resolved on d.c   multiple Consultations:   neurology  Discharge Exam: Vitals:   07/11/17 1010 07/11/17 1203  BP: (!) 186/76 (!) 188/83  Pulse: 78 79  Resp: (!) 24 20  Temp:  98.6 F (37 C)  SpO2: 96% 97%    General: awake aleert pleasant in nad Cardiovascular: s1 s2 no m/r/g, no m rrr Respiratory: clear neuro intact moving 4 limbs, no deficit now  Discharge Instructions   Discharge Instructions    Diet - low sodium heart healthy   Complete by:  As directed    Increase activity slowly   Complete by:  As directed      Allergies as of 07/11/2017      Reactions   Bactrim [sulfamethoxazole-trimethoprim] Hives, Itching, Swelling   Penicillins    Has patient had a PCN reaction causing immediate rash, facial/tongue/throat swelling, SOB or lightheadedness with hypotension: Unk Has patient had a PCN reaction causing severe rash involving mucus membranes or skin necrosis: Unk Has patient had a PCN reaction that required hospitalization: Unk Has patient had a PCN reaction occurring within the last 10 years: No If all of the above answers are "NO", then may proceed with Cephalosporin use.   Statins    myalgia   Sulfa Drugs Cross Reactors Hives, Itching, Swelling  Med Rec must be completed prior to using this [place]    Allergies  Allergen Reactions  . Bactrim [Sulfamethoxazole-Trimethoprim] Hives, Itching and Swelling  . Penicillins     Has patient had a PCN reaction causing immediate rash, facial/tongue/throat swelling, SOB or lightheadedness with hypotension: Unk Has patient had a PCN reaction causing severe rash involving mucus membranes or skin necrosis: Unk Has patient had a PCN  reaction that required hospitalization: Unk Has patient had a PCN reaction occurring within the last 10 years: No If all of the above answers are "NO", then may proceed with Cephalosporin use.  . Statins     myalgia  . Sulfa Drugs Cross Reactors Hives, Itching and Swelling   Follow-up Information    [facility] Follow up on 07/25/2017.   Specialty:  Cardiology Why:  3:00PM, wound check visit Contact information: [address] [place] [phone]           The results of significant diagnostics from this hospitalization (including imaging, microbiology, ancillary and laboratory) are listed below for reference.    Significant Diagnostic Studies: Ct Angio Head W Or Wo Contrast  Result Date: 07/08/2017 CLINICAL DATA:  Expressive aphasia and RIGHT-sided weakness, improving. EXAM: CT ANGIOGRAPHY HEAD AND NECK TECHNIQUE: Multidetector CT imaging of the head and neck was performed using the standard protocol during bolus administration of intravenous contrast. Multiplanar CT image reconstructions and MIPs were obtained to evaluate the vascular anatomy. Carotid stenosis measurements (when applicable) are obtained utilizing NASCET criteria, using the distal internal carotid diameter as the denominator. CONTRAST:  50 mL ISOVUE-370 IOPAMIDOL (ISOVUE-370) INJECTION 76% COMPARISON:  CT perfusion reported separately. Noncontrast CT head reported earlier. FINDINGS: CTA NECK Aortic arch: Standard branching. Imaged portion shows no evidence of aneurysm or dissection. No significant stenosis of the major arch vessel origins. Right carotid system: Calcified and noncalcified plaque at the bifurcation. No evidence of dissection, stenosis (50% or greater) or occlusion. Left carotid system: Calcified and particularly noncalcified plaque at the bifurcation. Lipid laden soft plaque extends from the bifurcation nearly 2 cm into cervical ICA, see sagittal  image 148 series 14. No evidence of frank dissection, stenosis (50% or greater) or occlusion. Vertebral arteries: BILATERAL patent, LEFT slightly larger. No evidence of dissection, stenosis (50% or greater) or occlusion. Nonvascular soft tissues:  Noncontributory. CTA HEAD Anterior circulation: The skullbase, cavernous, and supraclinoid ICA segments appear widely patent. There is moderate irregularity of the A1 ACA on the LEFT, non worrisome given the dominant RIGHT A1 ACA. No MCA stenosis or occlusion. No significant stenosis, proximal occlusion, aneurysm, or vascular malformation. Posterior circulation: Both vertebral arteries contribute to basilar formation, LEFT dominant. Focal narrowing RIGHT P1 PCA estimated 50%. No significant stenosis, proximal occlusion, aneurysm, or vascular malformation. Venous sinuses: As permitted by contrast timing, patent. Anatomic variants: None of significance. Delayed phase:  Not performed. Review of the MIP images confirms the above findings. IMPRESSION: No extracranial flow reducing stenosis, but lipid laden plaque does extend for almost 2 cm into the LEFT ICA, areas concerning as a source of recurrent/distal emboli. See discussion above. Findings discussed with ordering provider. Non stenotic calcified and non calcified plaque at both bifurcations. No intracranial large vessel occlusion or significant proximal intracranial anterior circulation stenosis. Electronically Signed   By: Staci Righter M.D.   On: 07/08/2017 09:58   Ct Angio Neck W Or Wo Contrast  Result Date: 07/08/2017 CLINICAL DATA:  Expressive aphasia and RIGHT-sided weakness, improving.  EXAM: CT ANGIOGRAPHY HEAD AND NECK TECHNIQUE: Multidetector CT imaging of the head and neck was performed using the standard protocol during bolus administration of intravenous contrast. Multiplanar CT image reconstructions and MIPs were obtained to evaluate the vascular anatomy. Carotid stenosis measurements (when applicable) are  obtained utilizing NASCET criteria, using the distal internal carotid diameter as the denominator. CONTRAST:  50 mL ISOVUE-370 IOPAMIDOL (ISOVUE-370) INJECTION 76% COMPARISON:  CT perfusion reported separately. Noncontrast CT head reported earlier. FINDINGS: CTA NECK Aortic arch: Standard branching. Imaged portion shows no evidence of aneurysm or dissection. No significant stenosis of the major arch vessel origins. Right carotid system: Calcified and noncalcified plaque at the bifurcation. No evidence of dissection, stenosis (50% or greater) or occlusion. Left carotid system: Calcified and particularly noncalcified plaque at the bifurcation. Lipid laden soft plaque extends from the bifurcation nearly 2 cm into cervical ICA, see sagittal image 148 series 14. No evidence of frank dissection, stenosis (50% or greater) or occlusion. Vertebral arteries: BILATERAL patent, LEFT slightly larger. No evidence of dissection, stenosis (50% or greater) or occlusion. Nonvascular soft tissues:  Noncontributory. CTA HEAD Anterior circulation: The skullbase, cavernous, and supraclinoid ICA segments appear widely patent. There is moderate irregularity of the A1 ACA on the LEFT, non worrisome given the dominant RIGHT A1 ACA. No MCA stenosis or occlusion. No significant stenosis, proximal occlusion, aneurysm, or vascular malformation. Posterior circulation: Both vertebral arteries contribute to basilar formation, LEFT dominant. Focal narrowing RIGHT P1 PCA estimated 50%. No significant stenosis, proximal occlusion, aneurysm, or vascular malformation. Venous sinuses: As permitted by contrast timing, patent. Anatomic variants: None of significance. Delayed phase:  Not performed. Review of the MIP images confirms the above findings. IMPRESSION: No extracranial flow reducing stenosis, but lipid laden plaque does extend for almost 2 cm into the LEFT ICA, areas concerning as a source of recurrent/distal emboli. See discussion above. Findings  discussed with ordering provider. Non stenotic calcified and non calcified plaque at both bifurcations. No intracranial large vessel occlusion or significant proximal intracranial anterior circulation stenosis. Electronically Signed   By: Staci Righter M.D.   On: 07/08/2017 09:58   Mr Brain Wo Contrast  Result Date: 07/08/2017 CLINICAL DATA:  Confusion with expressive aphasia. This began earlier today. EXAM: MRI HEAD WITHOUT CONTRAST TECHNIQUE: Multiplanar, multiecho pulse sequences of the brain and surrounding structures were obtained without intravenous contrast. COMPARISON:  CT head without contrast, CT perfusion, and CTA head neck, all performed earlier today. FINDINGS: The patient was unable to remain motionless for the exam. Small or subtle lesions could be overlooked. Brain: Multifocal areas of restricted diffusion, corresponding low ADC, affect the LEFT hemisphere within the LEFT MCA territory. Largest confluent area involves the LEFT frontal cortex and subcortical white matter. Additional smaller areas are seen in the LEFT anterior frontal cortex, LEFT posterior frontal cortex, LEFT superior insular ribbon, and LEFT periventricular white matter. No hemorrhage, mass lesion, hydrocephalus, or extra-axial fluid. Vascular: Flow voids are maintained.  No large vessel occlusion. Skull and upper cervical spine: Normal marrow signal. Sinuses/Orbits: Grossly negative. Other: None. IMPRESSION: Multifocal areas of restricted diffusion consistent with acute nonhemorrhagic infarction affecting the LEFT MCA territory. Largest confluent area affects the LEFT frontal cortex and subcortical white matter.Shower of emboli is suspected. Electronically Signed   By: Staci Righter M.D.   On: 07/08/2017 14:49   Ct Cerebral Perfusion W Contrast  Result Date: 07/08/2017 CLINICAL DATA:  Code stroke. Focal neuro deficit less than 6 hours. Transient ischemic attack versus hypertensive encephalopathy. Stroke  risk factors include  hypertension and diabetes. Expressive aphasia and RIGHT-sided weakness, improving exam. Initial systolic blood pressure to 40 is improving. EXAM: CT PERFUSION BRAIN TECHNIQUE: Multiphase CT imaging of the brain was performed following IV bolus contrast injection. Subsequent parametric perfusion maps were calculated using RAPID software. CONTRAST:  50 mL ISOVUE-370 IOPAMIDOL (ISOVUE-370) INJECTION 76% COMPARISON:  Code stroke CT earlier in the day FINDINGS: CT Brain Perfusion Findings: CBF (<30%) Volume: 90mL Perfusion (Tmax>6.0s) volume: 72mL Mismatch Volume: 60mL Mismatch location:  LEFT posterior frontal cortex. IMPRESSION: No core infarct is demonstrated. CBF (< 30%) volume: 0 mL. There is a small area of perfusion mismatch LEFT posterior frontal cortex. Electronically Signed   By: Staci Righter M.D.   On: 07/08/2017 09:36   Ct Head Code Stroke Wo Contrast  Result Date: 07/08/2017 CLINICAL DATA:  Code stroke.  RIGHT-sided weakness with  aphasia. EXAM: CT HEAD WITHOUT CONTRAST TECHNIQUE: Contiguous axial images were obtained from the base of the skull through the vertex without intravenous contrast. COMPARISON:  None. FINDINGS: Brain: No evidence of acute infarction, hemorrhage, hydrocephalus, extra-axial collection or mass lesion/mass effect. Normal for age cerebral volume. Mild hypoattenuation of the white matter, possible small vessel disease. Vascular: No hyperdense vessel or unexpected calcification. Skull: Normal. Negative for fracture or focal lesion. Sinuses/Orbits: No acute finding. Other: None. ASPECTS [facility] Stroke Program Early CT Score) - Ganglionic level infarction (caudate, lentiform nuclei, internal capsule, insula, M1-M3 cortex): 7 - Supraganglionic infarction (M4-M6 cortex): 3 Total score (0-10 with 10 being normal): 10 IMPRESSION: 1. Negative exam 2. ASPECTS is 10. These results were communicated to Dr. Lorraine Lax At 8:59 amon 1/5/2019by text page via the [facility] messaging system. Electronically  Signed   By: Staci Righter M.D.   On: 07/08/2017 09:02    Microbiology: No results found for this or any previous visit (from the past 240 hour(s)).   Labs: Basic Metabolic Panel: Recent Labs  Lab 07/08/17 0842 07/08/17 0847 07/09/17 0327  NA 139 144 135  K 4.0 3.7 3.0*  CL 108 108 105  CO2 22  --  20*  GLUCOSE 207* 210* 146*  BUN 27* 30* 23*  CREATININE 2.32* 2.30* 2.21*  CALCIUM 8.6*  --  8.8*   Liver Function Tests: Recent Labs  Lab 07/08/17 0842 07/09/17 0327  AST 23 19  ALT 14* 14*  ALKPHOS 87 88  BILITOT 0.9 1.0  PROT 6.8 6.5  ALBUMIN 3.8 3.5   No results for input(s): LIPASE, AMYLASE in the last 168 hours. No results for input(s): AMMONIA in the last 168 hours. CBC: Recent Labs  Lab 07/08/17 0842 07/08/17 0847  WBC 7.7  --   NEUTROABS 4.7  --   HGB 11.5* 12.6*  HCT 34.0* 37.0*  MCV 78.2  --   PLT 236  --    Cardiac Enzymes: No results for input(s): CKTOTAL, CKMB, CKMBINDEX, TROPONINI in the last 168 hours. BNP: BNP (last 3 results) No results for input(s): BNP in the last 8760 hours.  ProBNP (last 3 results) No results for input(s): PROBNP in the last 8760 hours.  CBG: Recent Labs  Lab 07/10/17 0811 07/10/17 1146 07/10/17 1607 07/11/17 0344 07/11/17 0623  GLUCAP 113* 120* 118* 96 96       Signed:  Nita Sells MD   [facility] 07/11/2017, 12:58 PM

## 2017-07-11 NOTE — IPOC Note (Signed)
Overall Plan of Care [facility]) Patient Details Name: [name] [name] MRN: [id number] DOB: June 14, 1946  Admitting Diagnosis: Left MCA stroke  Hospital Problems: Active Problems:   Left middle cerebral artery stroke (HCC)   Acute ischemic left MCA stroke (HCC)   Diabetes mellitus type 2 in obese ([place])   Stage 3 chronic kidney disease ([place])   Benign essential HTN     Functional Problem List: Nursing Edema, Endurance, Medication Management, Motor, Sensory, Nutrition  PT Balance, Endurance, Motor  OT Balance, Cognition, Perception, Safety, Endurance  SLP    TR         Basic ADL's: OT Bathing, Dressing, Toileting     Advanced  ADL's: OT Simple Meal Preparation, Light Housekeeping     Transfers: PT Bed Mobility, Bed to Chair, Car, Floor, Manufacturing systems engineer, Tub/Shower     Locomotion: PT Ambulation, Stairs     Additional Impairments: OT None  SLP Social Cognition, Communication expression Attention, Awareness, Problem Solving  TR      Anticipated Outcomes Item Anticipated Outcome  Self Feeding Independent  Swallowing      Basic self-care  mod I  Toileting  mod I   Bathroom Transfers supervision to tub/ shower, mod I to toilet  Bowel/Bladder  Pt will manage bowel and bladder with mod I assist at discharge   Transfers  mod I  Locomotion  mod I  Communication  Min A  Cognition  Supervision   Pain  Pt will manage pain at 4 or less on a scale of 0-10 while in rehab.   Safety/Judgment      Therapy Plan: PT Intensity: Minimum of 1-2 x/day ,45 to 90 minutes PT Frequency: 5 out of 7 days PT Duration Estimated Length of Stay: 5-7 days OT Intensity: Minimum of 1-2 x/day, 45 to 90 minutes OT Frequency: 5 out of 7 days OT Duration/Estimated Length of Stay: 3-5 days SLP Intensity: Minumum of 1-2 x/day, 30 to 90 minutes SLP Frequency: 3 to 5 out of 7 days SLP Duration/Estimated Length of Stay: 5-7 days     Team Interventions: Nursing Interventions  Patient/Family Education, Medication Management, Pain Management, Disease Management/Prevention, Cognitive Remediation/Compensation, Discharge Planning  PT interventions Ambulation/gait training, Community reintegration, DME/adaptive equipment instruction, Neuromuscular re-education, Stair training, UE/LE Strength taining/ROM, Wheelchair propulsion/positioning, UE/LE Coordination activities, Therapeutic Activities, Pain management, Discharge planning, Training and development officer, Cognitive remediation/compensation, Functional mobility training, Patient/family education, Splinting/orthotics, Therapeutic Exercise  OT Interventions Balance/vestibular training, Cognitive remediation/compensation, Discharge planning, DME/adaptive equipment instruction, Self Care/advanced ADL retraining, Therapeutic Activities, Therapeutic Exercise, Visual/perceptual remediation/compensation, Neuromuscular re-education, Patient/family education, Functional mobility training  SLP Interventions Cognitive remediation/compensation, Environmental controls, Internal/external aids, Cueing hierarchy, Dysphagia/aspiration precaution training, Functional tasks, Patient/family education, Therapeutic Activities  TR Interventions    SW/CM Interventions Discharge Planning, Psychosocial Support, Patient/Family Education   Barriers to Discharge MD  Medical stability  Nursing      PT      OT      SLP      SW       Team Discharge Planning: Destination: PT-Home ,OT- Home , SLP-Home Projected Follow-up: PT-Outpatient PT, OT-  Outpatient OT, SLP-24 hour supervision/assistance, Outpatient SLP Projected Equipment Needs: PT-None recommended by PT, OT- Tub/shower seat, SLP-None recommended by SLP Equipment Details: PT- , OT-  Patient/family involved in discharge planning: PT- Patient,  OT-Patient, SLP-Patient  MD ELOS: 4-7 days. Medical Rehab Prognosis:  Good  Assessment: 72 y.o.malewith history of HTN, T2DM, who was admitted on  01/05/19with difficulty speaking and mild right sided  weakness. BP elevated in ED and CT head negative of for hemorrhage. CTA/Perfusion brain showed lipid laden plaques extending almost 2 cm into L-ICA concerning for recurrent/distal emboli. MRI brains showed multifocal areas of restricted diffusion in L-MCA territory--larges in left frontal cortex and subcortical white matter felt to be embolic. 2D echo showed EF 55-60% with no wall abnormality. Carotid dopplers without significant ICA stenosis. On ASA for secondary stroke prevention andVascular consult recommended for L-ICA plaque. 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   See Team Conference Notes for weekly updates to the plan of care

## 2017-07-11 NOTE — Progress Notes (Signed)
STROKE TEAM PROGRESS NOTE   HISTORY OF PRESENT ILLNESS (per record) [name] [name] is an 72 y.o. male malewith medical history significant ofdiabetes mellitus type 2, CKD stage III, hypertension, hyperlipidemia presented as stroke alert with aphasia. LSN was 2 am according to wife.  When he woke up at 8am,he was confused, speaking only few words. After few hours, EMS was alerted. BP was 240 systolic per EMS. On arrival patient had aphasia and mild right side weakness.   Date last known well: 1.5.19 Time last known well: 2 am tPA Given: no, outside window NIHSS: 4 Baseline MRS 0     SUBJECTIVE (INTERVAL HISTORY) His  Family is not at bedside, discussed findings and answered all questions. Discussed at length the need for TEE and loop recorder   Home Medications:  Current Meds  Medication Sig  . amLODipine (NORVASC) 10 MG tablet Take 1 tablet (10 mg total) by mouth daily.  . benazepril (LOTENSIN) 40 MG tablet Take 1 tablet (40 mg total) by mouth 2 (two) times daily.  . furosemide (LASIX) 80 MG tablet 160 mg po q a.m and 80 mg po q afternoon  . hydrALAZINE (APRESOLINE) 25 MG tablet Take 1 tablet (25 mg total) by mouth 3 (three) times daily.  . insulin aspart (NOVOLOG) 100 UNIT/ML injection Take 6 units with every meal unless blood sugar above 200 than take 10 units (Patient taking differently: Inject 12 Units into the skin 3 (three) times daily with meals. )  . Insulin Glargine (LANTUS SOLOSTAR) 100 UNIT/ML Solostar Pen INJECT 20 UNITS SUBCUTANEOUSLY ONCE DAILY. NEED TO MAKE APPOINTMENT. (Patient taking differently: Inject 25 Units into the skin daily at 10 pm. )  . labetalol (NORMODYNE) 200 MG tablet Take 1 tablet (200 mg total) by mouth 2 (two) times daily.      Hospital Medications:  . [START ON 07/12/2017] aspirin EC  325 mg Oral Daily  . [START ON 07/12/2017] ezetimibe  10 mg Oral Daily  . hydrALAZINE  25 mg Oral TID  . insulin glargine  25 Units Subcutaneous Q2200     OBJECTIVE Temp:  [98 F (36.7 C)-99.9 F (37.7 C)] 98.5 F (36.9 C) (01/08 1351) Pulse Rate:  [68-97] 82 (01/08 1351) Cardiac Rhythm: Normal sinus rhythm (01/07 1922) Resp:  [18-25] 18 (01/08 1351) BP: (167-234)/(70-111) 185/82 (01/08 1351) SpO2:  [95 %-100 %] 99 % (01/08 1351)  CBC:  Recent Labs  Lab 07/08/17 0842 07/08/17 0847  WBC 7.7  --   NEUTROABS 4.7  --   HGB 11.5* 12.6*  HCT 34.0* 37.0*  MCV 78.2  --   PLT 236  --     Basic Metabolic Panel:  Recent Labs  Lab 07/08/17 0842 07/08/17 0847 07/09/17 0327  NA 139 144 135  K 4.0 3.7 3.0*  CL 108 108 105  CO2 22  --  20*  GLUCOSE 207* 210* 146*  BUN 27* 30* 23*  CREATININE 2.32* 2.30* 2.21*  CALCIUM 8.6*  --  8.8*    Lipid Panel:     Component Value Date/Time   CHOL 279 (H) 07/09/2017 0327   TRIG 156 (H) 07/09/2017 0327   TRIG 323 (HH) 06/13/2006 1050   HDL 36 (L) 07/09/2017 0327   CHOLHDL 7.8 07/09/2017 0327   VLDL 31 07/09/2017 0327   LDLCALC 212 (H) 07/09/2017 0327   HgbA1c:  Lab Results  Component Value Date   HGBA1C 8.1 (H) 07/09/2017   Urine Drug Screen: No results found for: LABOPIA, [place], LABBENZ,  AMPHETMU, THCU, LABBARB  Alcohol Level No results found for: ETH  IMAGING   Ct Angio Head W Or Wo Contrast Ct Angio Neck W Or Wo Contrast 07/08/2017 IMPRESSION:  No extracranial flow reducing stenosis, but lipid laden plaque does extend for almost 2 cm into the LEFT ICA, areas concerning as a source of recurrent/distal emboli. See discussion above. Findings discussed with ordering provider. Non stenotic calcified and non calcified plaque at both bifurcations. No intracranial large vessel occlusion or significant proximal intracranial anterior circulation stenosis.    Mr Brain Wo Contrast 07/08/2017 IMPRESSION: Multifocal areas of restricted diffusion consistent with acute nonhemorrhagic infarction affecting the LEFT MCA territory. Largest confluent area affects the LEFT frontal  cortex and subcortical white matter. Shower of emboli is suspected.   Ct Cerebral Perfusion W Contrast  07/08/2017 IMPRESSION:  No core infarct is demonstrated. CBF (< 30%) volume: 0 mL.  There is a small area of perfusion mismatch LEFT posterior frontal cortex.   Ct Head Code Stroke Wo Contrast 07/08/2017 IMPRESSION:  1. Negative exam  2. ASPECTS is 10.     Transthoracic Echocardiogram - Left ventricle: The cavity size was mildly dilated. Wall   thickness was increased in a pattern of mild LVH. Systolic   function was normal. The estimated ejection fraction was in the   range of 55% to 60%. Wall motion was normal; there were no   regional wall motion abnormalities    Bilateral Carotid Dopplers -1-39% ICA stenosis bilaterally.         PHYSICAL EXAM Vitals:   07/11/17 1000 07/11/17 1010 07/11/17 1203 07/11/17 1351  BP: (!) 189/80 (!) 186/76 (!) 188/83 (!) 185/82  Pulse: 82 78 79 82  Resp: (!) 25 (!) 24 20 18   Temp:   98.6 F (37 C) 98.5 F (36.9 C)  TempSrc:   Oral Oral  SpO2: 95% 96% 97% 99%  Weight:      Height:       PHYSICAL EXAM Physical exam: Exam: Gen: NAD Eyes: anicteric sclerae, moist conjunctivae                    CV: no MRG, no carotid bruits, no peripheral edema Mental Status: Alert, follows commands  Neuro: Detailed Neurologic Exam  Speech:    No aphasia, no dysarthria  Cranial Nerves:    The pupils are equal, round, and reactive to light.. Attempted, Fundi not visualized.  EOMI. Visual fields full. Face symmetric, Tongue midline. Hearing intact to voice. Shoulder shrug intact. Hearing intact to voice.   Motor Observation:    no involuntary movements noted. Tone appears normal.     Strength:    Right hemiparesis 4/5 arm greater than leg. Diminished fine finger movements on the right. Orbits left over right upper extremity.     Sensation:  Intact to LT  Plantars downgoing.    ASSESSMENT/PLAN [name] is a 72 y.o. male  with history of chronic kidney disease, hypertension, hyperlipidemia, and diabetes mellitus presenting with aphasia, confusion, and mild right-sided weakness. He did not receive IV t-PA due to late presentation.  Stroke:  Left middle cerebral artery territory embolic infarcts - possibly from the left ICA.  Resultant  Mild right hemiparesis  CT head - Negative exam   MRI head - Multifocal areas of restricted diffusion consistent with acute nonhemorrhagic infarction affecting the LEFT MCA territory  MRA head - not performed  CTA H&N -  lipid laden plaque does extend for almost 2  cm into the LEFT ICA.  Carotid Doppler - pending  2D Echo - pending  LDL - 212  HgbA1c - 8.1  VTE prophylaxis - SCDs Diet heart healthy/carb modified Room service appropriate? Yes; Fluid consistency: Thin Diet - low sodium heart healthy  No antithrombotic prior to admission, now on aspirin 325 mg daily  Patient counseled to be compliant with his antithrombotic medications  Ongoing aggressive stroke risk factor management  Therapy recommendations:  pending  Disposition:  Pending  Hypertension  Blood pressure tends to run high  Permissive hypertension (OK if < 220/120) but gradually normalize in 5-7 days  Long-term BP goal normotensive  Hyperlipidemia  Home meds:  No lipid lowering medications prior to admission  LDL 212, goal < 70  Statin intolerance -> myalgias - consider Zetia 10 mg daily.  Continue statin at discharge  Diabetes  HgbA1c 8.1, goal < 7.0  Uncontrolled  Other Stroke Risk Factors  Advanced age  Obesity, Body mass index is 30.61 kg/m., recommend weight loss, diet and exercise as appropriate   Family hx stroke (father and grandmother)   Other Active Problems  Hypokalemia  Chronic kidney disease  Lipid laden plaque does extend for almost 2 cm into the LEFT ICA.   Plan / Recommendations   Stroke workup - await 2-D echo   Needs better glucose  control  Discuss treatment options for left ICA plaque with vascular sx  Consider PCSK9 inhibitor for hyperlipidemia due to statin intolerance.  Vascular consult for left ICA plaque   Hospital day # 2  I have personally examined this patient, reviewed notes, independently viewed imaging studies, participated in medical decision making and plan of care.ROS completed by me personally and pertinent positives fully documented  I have made any additions or clarifications directly to the above note. Patient has presented with embolic left MCA infarct of cryptogenic etiology without definite identified source.  TEE  Unremarkable and loop recorder inserted. Transfer to inpatient rehabilitation when bed available. .. Discussed with Dr. Verlon Au. Greater than 50% time during this 25 minute visit was spent on counseling and coordination of care about his recent stroke and discussion about evaluation and treatment plan and answered questions.  Follow-up as an outpatient in the stroke clinic in 6 weeks. Stroke team will sign off. Kindly call for questions. Antony Contras, MD Medical Director [facility] Pager: [phone] 07/11/2017 2:35 PM        To contact Stroke Continuity provider, please refer to [email or web address] After hours, contact General Neurology

## 2017-07-11 NOTE — H&P (Signed)
Physical Medicine and Rehabilitation Admission H&P    Chief Complaint  Patient presents with  . Functional deficits due to stroke   HPI: [name] [name] is a 72 y.o. male with history of HTN, T2DM, who was admitted on 07/08/17 with difficulty speaking and mild right sided weakness. BP elevated in ED and CT head negative of for hemorrhage. CTA/Perfusion  brain showed lipid laden plaques extending almost 2 cm into L-ICA concerning for recurrent/distal emboli.  MRI brains showed multifocal areas of restricted diffusion in L-MCA territory--larges in left frontal cortex and subcortical white matter felt to be embolic. 2D echo showed EF 55-60% with no wall abnormality. Carotid dopplers without significant ICA stenosis.   On ASA for secondary stroke prevention and Vascular consult recommended for L-ICA plaque.  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 Patient limited by right sided weakness with inattention, difficulty processing higher levels commands, apraxia and aphasia. Therapy evaluations completed yesterday and CIR recommended due to functional deficits.    Review of Systems  Constitutional: Negative for fever.  HENT: Negative for hearing loss.   Eyes: Negative for blurred vision.  Respiratory: Negative for cough and shortness of breath.   Cardiovascular: Negative for chest pain and leg swelling.  Gastrointestinal: Negative for nausea.  Genitourinary: Negative for dysuria, frequency and urgency.  Musculoskeletal: Negative for back pain and myalgias.  Skin: Negative for rash.  Neurological: Positive for focal weakness. Negative for dizziness.  Psychiatric/Behavioral: Negative for depression. The patient does not have insomnia.       Past Medical History:  Diagnosis Date  . DIABETES MELLITUS, TYPE II 01/02/2007  . HYPERLIPIDEMIA 06/18/2007  . HYPERTENSION 01/02/2007  . RENAL  INSUFFICIENCY 02/05/2008    Past Surgical History:  Procedure Laterality Date  . SHOULDER SURGERY     left    Family History  Problem Relation Age of Onset  . Hypertension Mother   . Diabetes Mother   . Stroke Father   . Stroke Maternal Grandmother     Social History:  Married. Retired Agricultural consultant. Wife at home and can provide supervision after discharge. He reports that  has never smoked. he has never used smokeless tobacco. He drinks alcohol on rare occasions or wine. he reports that he does use drugs.    Allergies  Allergen Reactions  . Bactrim [Sulfamethoxazole-Trimethoprim] Hives, Itching and Swelling  . Penicillins     Has patient had a PCN reaction causing immediate rash, facial/tongue/throat swelling, SOB or lightheadedness with hypotension: Unk Has patient had a PCN reaction causing severe rash involving mucus membranes or skin necrosis: Unk Has patient had a PCN reaction that required hospitalization: Unk Has patient had a PCN reaction occurring within the last 10 years: No If all of the above answers are "NO", then may proceed with Cephalosporin use.  . Statins     myalgia  . Sulfa Drugs Cross Reactors Hives, Itching and Swelling    Medications Prior to Admission  Medication Sig Dispense Refill  . amLODipine (NORVASC) 10 MG tablet Take 1 tablet (10 mg total) by mouth daily. 30 tablet 0  . benazepril (LOTENSIN) 40 MG tablet Take 1 tablet (40 mg total) by mouth 2 (two) times daily. 60 tablet 0  . furosemide (LASIX) 80 MG tablet 160 mg po q a.m and 80 mg po q afternoon 270 tablet 3  . hydrALAZINE (APRESOLINE) 25 MG tablet Take 1 tablet (  25 mg total) by mouth 3 (three) times daily. 90 tablet 0  . insulin aspart (NOVOLOG) 100 UNIT/ML injection Take 6 units with every meal unless blood sugar above 200 than take 10 units (Patient taking differently: Inject 12 Units into the skin 3 (three) times daily with meals. ) 3 vial 11  . Insulin Glargine (LANTUS SOLOSTAR) 100 UNIT/ML  Solostar Pen INJECT 20 UNITS SUBCUTANEOUSLY ONCE DAILY. NEED TO MAKE APPOINTMENT. (Patient taking differently: Inject 25 Units into the skin daily at 10 pm. ) 2 pen 0  . labetalol (NORMODYNE) 200 MG tablet Take 1 tablet (200 mg total) by mouth 2 (two) times daily. 180 tablet 0  . calcitRIOL (ROCALTROL) 0.25 MCG capsule Take 1 capsule by mouth daily.    . cyclobenzaprine (FLEXERIL) 10 MG tablet Take 1 tablet (10 mg total) by mouth 3 (three) times daily as needed for muscle spasms. 30 tablet 0  . fluvastatin (LESCOL) 40 MG capsule Take 1 capsule (40 mg total) by mouth at bedtime. (Patient not taking: Reported on 07/08/2017) 30 capsule 11  . glucose blood (ONETOUCH VERIO) test strip 1 each by Other route daily. Use as instructed (Patient not taking: Reported on 07/08/2017) 100 each 11  . Insulin Pen Needle (B-D UF III MINI PEN NEEDLES) 31G X 5 MM MISC USE AS DIRECTED (Patient not taking: Reported on 07/08/2017) 100 each 11  . nateglinide (STARLIX) 120 MG tablet Take 1 tablet (120 mg total) by mouth 3 (three) times daily with meals. (Patient not taking: Reported on 07/08/2017) 90 tablet 6  . potassium chloride SA (K-DUR,KLOR-CON) 20 MEQ tablet Take 1 tablet by mouth daily.      Drug Regimen Review  Drug regimen was reviewed and remains appropriate with no significant issues identified  Home: Home Living Family/patient expects to be discharged to:: Private residence Living Arrangements: Spouse/significant other Available Help at Discharge: Family Type of Home: House Home Access: Stairs to enter Technical brewer of Steps: 2 Home Layout: Two level, Able to live on main level with bedroom/bathroom  Lives With: Spouse   Functional History: Prior Function Level of Independence: Independent  Functional Status:  Mobility: Bed Mobility Overal bed mobility: Needs Assistance Bed Mobility: Rolling, Sidelying to Sit Rolling: Supervision Sidelying to sit: Supervision General bed mobility comments:  increased time, definite use of bed rail, no physical assist. Transfers Overall transfer level: Needs assistance Equipment used: None Transfers: Sit to/from Stand Sit to Stand: Min assist General transfer comment: minA for safety Ambulation/Gait Ambulation/Gait assistance: Min assist Ambulation Distance (Feet): 250 Feet Assistive device: None(held onto gait belt) Gait Pattern/deviations: Step-through pattern, Drifts right/left General Gait Details: pt hovers to R side and consistantly near misses objects on the right side. Pt unable to navigate the hallways with simple R/L directions Gait velocity: slow Gait velocity interpretation: Below normal speed for age/gender Stairs: Yes Stairs assistance: Min assist Stair Management: One rail Right, Alternating pattern Number of Stairs: 3 General stair comments: pt with trip up stairs due to inability to clear R foot requiring minA to maintain balance    ADL: ADL Overall ADL's : Needs assistance/impaired Eating/Feeding: Set up, Sitting Eating/Feeding Details (indicate cue type and reason): rest breaks likely needed for feeding with RUE. Daughter reports only minimal spillage (Jell-o) during lunch.  Grooming: Set up, Wash/dry face Grooming Details (indicate cue type and reason): setup assist provided as Pt with watering eyes; issued built up handle and educated Pt/[name] on it's uses to increase independence during self-feeding and grooming tasks  Upper Body Bathing: Moderate assistance, Sitting Lower Body Bathing: Moderate assistance, Sit to/from stand Upper Body Dressing : Moderate assistance, Sitting Lower Body Dressing: Moderate assistance, Sit to/from stand Toilet Transfer: Minimal assistance, Ambulation, RW, BSC Toileting- Clothing Manipulation and Hygiene: Moderate assistance, Sit to/from stand Tub/ Shower Transfer: Moderate assistance Functional mobility during ADLs: Minimal assistance, Rolling walker General ADL Comments: Pt  with increased BP this afternoon (212/103) therefore OOB activity deferred at this time. Pt having increasingly watering eyes, provided washcloth for bed level grooming; issued built up handle for ADL completion and education provided on its uses; Pt participated in [facility] activities with minA for completion;  Cognition: Cognition Overall Cognitive Status: Impaired/Different from baseline Arousal/Alertness: Awake/alert Orientation Level: Oriented X4 Attention: Sustained Sustained Attention: Appears intact Cognition Arousal/Alertness: Awake/alert Behavior During Therapy: Flat affect Overall Cognitive Status: Impaired/Different from baseline Area of Impairment: Following commands, Problem solving Following Commands: Follows multi-step commands inconsistently, Follows one step commands with increased time Problem Solving: Slow processing, Difficulty sequencing, Requires verbal cues, Requires tactile cues General Comments: Pt initially not able to state DOB, able to state his age with increased time; Upon exiting and returning to room Pt able to state his DOB with increased time/effort, with min assist for word finding when stating the year  Difficult to assess due to: Impaired communication   Blood pressure (!) 186/76, pulse 78, temperature 99.9 F (37.7 C), temperature source Oral, resp. rate (!) 24, height 6\' 3"  (1.905 m), weight 111.1 kg (244 lb 14.9 oz), SpO2 96 %. Physical Exam  Constitutional: He is oriented to person, place, and time. He appears well-developed and well-nourished.  HENT:  Head: Normocephalic and atraumatic.  Mouth/Throat: Oropharynx is clear and moist.  Eyes: Conjunctivae and EOM are normal. Pupils are equal, round, and reactive to light.  Neck: Normal range of motion. Neck supple. No tracheal deviation present. No thyromegaly present.  Cardiovascular: Normal rate. Exam reveals no friction rub.  No murmur heard. Respiratory: Effort normal and breath sounds normal.  No respiratory distress. He has no wheezes. He has no rales.  GI: Soft. Bowel sounds are normal. He exhibits no distension. There is no tenderness.  Musculoskeletal: He exhibits no edema.  Neurological: He is alert and oriented to person, place, and time.  Alert and oriented to place, person. A little sedated still from recent procedure. Right central 7 and tongue deviation. Speech dysarthric. Ongoing word finding deficits. Followed all one step commands occasionally with delay.  Sensation grossly intact in all 4 to LT and pain. RUE: 3 to 3+ prox to distal. RLE: 3/5 hf, ke and 4/5 adf/pf. LUE and LLE: 4+ to 5/5.   Skin: Skin is warm and dry.  Psychiatric: His affect is blunt. He is slowed. Cognition and memory are impaired.  Flat, a little sedated    Results for orders placed or performed during the hospital encounter of 07/08/17 (from the past 48 hour(s))  Glucose, capillary     Status: Abnormal   Collection Time: 07/09/17  5:12 PM  Result Value Ref Range   Glucose-Capillary 114 (H) 65 - 99 mg/dL  Glucose, capillary     Status: Abnormal   Collection Time: 07/09/17  8:23 PM  Result Value Ref Range   Glucose-Capillary 164 (H) 65 - 99 mg/dL  Glucose, capillary     Status: Abnormal   Collection Time: 07/10/17 12:05 AM  Result Value Ref Range   Glucose-Capillary 123 (H) 65 - 99 mg/dL  Glucose, capillary  Status: None   Collection Time: 07/10/17  4:47 AM  Result Value Ref Range   Glucose-Capillary 83 65 - 99 mg/dL  Glucose, capillary     Status: Abnormal   Collection Time: 07/10/17  8:11 AM  Result Value Ref Range   Glucose-Capillary 113 (H) 65 - 99 mg/dL  Glucose, capillary     Status: Abnormal   Collection Time: 07/10/17 11:46 AM  Result Value Ref Range   Glucose-Capillary 120 (H) 65 - 99 mg/dL  Glucose, capillary     Status: Abnormal   Collection Time: 07/10/17  4:07 PM  Result Value Ref Range   Glucose-Capillary 118 (H) 65 - 99 mg/dL  Glucose, capillary     Status: None    Collection Time: 07/11/17  3:44 AM  Result Value Ref Range   Glucose-Capillary 96 65 - 99 mg/dL  Glucose, capillary     Status: None   Collection Time: 07/11/17  6:23 AM  Result Value Ref Range   Glucose-Capillary 96 65 - 99 mg/dL   No results found.     Medical Problem List and Plan: 1.  Right hemparesis and aphasia secondary to left MCA infarct  -admit to inpatient rehab 2.  DVT Prophylaxis/Anticoagulation: Pharmaceutical: Lovenox 3. Pain Management: N/A 4. Mood: LCSW to follow for evaluation and support.  5. Neuropsych: This patient is not fully capable of making decisions on his own behalf. 6. Skin/Wound Care: Pressure relief measures. Maintain adequate hydration and nutritional status.  7. Fluids/Electrolytes/Nutrition: Monitor I/O. Will check lytes in am.  8. HTN: Monitor BP bid with permissive HTN. Used Lotensin bid, Norvasc, lasix and hydralazine tid at home.  Continue low dose hydralazine for now.  9. CKD stage III: Followed by Dr. Jimmy Footman.  Monitor lytes serially. Resume calcitriol.  10. T2DM: Hgb A1C-8.1.  Monitor BS ac/hs. Continue lantus --resume starlix additionally.   11. Dyslipidemia: Unable to tolerate statins. Now on Zetia.  12. Left ICA plaque: Follow up with vascular after discharge.     Post Admission Physician Evaluation: 1. Functional deficits secondary  to left MCA infarct. 2. Patient is admitted to receive collaborative, interdisciplinary care between the physiatrist, rehab nursing staff, and therapy team. 3. Patient's level of medical complexity and substantial therapy needs in context of that medical necessity cannot be provided at a lesser intensity of care such as a SNF. 4. Patient has experienced substantial functional loss from his/her baseline which was documented above under the "Functional History" and "Functional Status" headings.  Judging by the patient's diagnosis, physical exam, and functional history, the patient has potential for functional  progress which will result in measurable gains while on inpatient rehab.  These gains will be of substantial and practical use upon discharge  in facilitating mobility and self-care at the household level. 5. Physiatrist will provide 24 hour management of medical needs as well as oversight of the therapy plan/treatment and provide guidance as appropriate regarding the interaction of the two. 6. The Preadmission Screening has been reviewed and patient status is unchanged unless otherwise stated above. 7. 24 hour rehab nursing will assist with bladder management, bowel management, safety, skin/wound care, disease management, medication administration, pain management and patient education  and help integrate therapy concepts, techniques,education, etc. 8. PT will assess and treat for/with: Lower extremity strength, range of motion, stamina, balance, functional mobility, safety, adaptive techniques and equipment, NMR, family ed.   Goals are: mod I. 9. OT will assess and treat for/with: ADL's, functional mobility, safety, upper extremity strength, adaptive  techniques and equipment, NMR, family ed.   Goals are: mod I to supervision. Therapy may proceed with showering this patient. 10. SLP will assess and treat for/with: cognition, language, communication, family ed.  Goals are: mod I to supervision/min assist. 11. Case Management and Social Worker will assess and treat for psychological issues and discharge planning. 12. Team conference will be held weekly to assess progress toward goals and to determine barriers to discharge. 13. Patient will receive at least 3 hours of therapy per day at least 5 days per week. 14. ELOS: 7-10 days       15. Prognosis:  excellent     Meredith Staggers, MD, [facility] 07/11/2017  Bary Leriche, PA-C 07/11/2017

## 2017-07-11 NOTE — H&P (View-Only) (Signed)
ELECTROPHYSIOLOGY CONSULT NOTE  Patient ID: [name] [name] MRN: [id number], DOB/AGE: 01-28-1946   Admit date: 07/08/2017 Date of Consult: 07/11/2017  Primary Physician: Dorothyann Peng, NP Primary Cardiologist: none Reason for Consultation: Cryptogenic stroke ; recommendations regarding Implantable Loop Recorder requested by Dr. Leonie Man  History of Present Illness [name] [name] was admitted on 07/08/2017 with acute CVA.   PMHx noted for DM, CRI (III), HTN, HLD.  Hefirst developed symptoms of aphasia, confusion, r sided weakness while at home.  Imaging demonstrated Left middle cerebral artery territory embolic infarcts - possibly from the left ICA.   he has undergone workup for stroke including echocardiogram and carotid angio.  The patient has been monitored on telemetry which has demonstrated sinus rhythm with no arrhythmias.  Inpatient stroke work-up is to be completed with a TEE.   Echocardiogram this admission demonstrated   Study Conclusions - Left ventricle: The cavity size was mildly dilated. Wall   thickness was increased in a pattern of mild LVH. Systolic   function was normal. The estimated ejection fraction was in the   range of 55% to 60%. Wall motion was normal; there were no   regional wall motion abnormalities. Doppler parameters are   consistent with abnormal left ventricular relaxation (grade 1   diastolic dysfunction). - Left atrium: The atrium was mildly dilated.   Lab work is reviewed.  Prior to admission, the patient denies chest pain, shortness of breath, dizziness, palpitations, or syncope.  They are recovering from their stroke.   Past Medical History:  Diagnosis Date  . DIABETES MELLITUS, TYPE II 01/02/2007  . HYPERLIPIDEMIA 06/18/2007  . HYPERTENSION 01/02/2007  . RENAL INSUFFICIENCY 02/05/2008     Surgical History:  Past Surgical History:  Procedure Laterality Date  . SHOULDER SURGERY     left     Medications Prior to Admission  Medication Sig  Dispense Refill Last Dose  . amLODipine (NORVASC) 10 MG tablet Take 1 tablet (10 mg total) by mouth daily. 30 tablet 0 07/07/2017 at Unknown time  . benazepril (LOTENSIN) 40 MG tablet Take 1 tablet (40 mg total) by mouth 2 (two) times daily. 60 tablet 0 07/07/2017 at Unknown time  . furosemide (LASIX) 80 MG tablet 160 mg po q a.m and 80 mg po q afternoon 270 tablet 3 07/07/2017 at Unknown time  . hydrALAZINE (APRESOLINE) 25 MG tablet Take 1 tablet (25 mg total) by mouth 3 (three) times daily. 90 tablet 0 07/07/2017 at Unknown time  . insulin aspart (NOVOLOG) 100 UNIT/ML injection Take 6 units with every meal unless blood sugar above 200 than take 10 units (Patient taking differently: Inject 12 Units into the skin 3 (three) times daily with meals. ) 3 vial 11 07/07/2017 at Unknown time  . Insulin Glargine (LANTUS SOLOSTAR) 100 UNIT/ML Solostar Pen INJECT 20 UNITS SUBCUTANEOUSLY ONCE DAILY. NEED TO MAKE APPOINTMENT. (Patient taking differently: Inject 25 Units into the skin daily at 10 pm. ) 2 pen 0 07/07/2017 at Unknown time  . labetalol (NORMODYNE) 200 MG tablet Take 1 tablet (200 mg total) by mouth 2 (two) times daily. 180 tablet 0 07/07/2017 at 1900  . calcitRIOL (ROCALTROL) 0.25 MCG capsule Take 1 capsule by mouth daily.   Not Taking at Unknown time  . cyclobenzaprine (FLEXERIL) 10 MG tablet Take 1 tablet (10 mg total) by mouth 3 (three) times daily as needed for muscle spasms. 30 tablet 0 PRN  . fluvastatin (LESCOL) 40 MG capsule Take 1 capsule (40 mg total)  by mouth at bedtime. (Patient not taking: Reported on 07/08/2017) 30 capsule 11 Not Taking at Unknown time  . glucose blood (ONETOUCH VERIO) test strip 1 each by Other route daily. Use as instructed (Patient not taking: Reported on 07/08/2017) 100 each 11 Not Taking at Unknown time  . Insulin Pen Needle (B-D UF III MINI PEN NEEDLES) 31G X 5 MM MISC USE AS DIRECTED (Patient not taking: Reported on 07/08/2017) 100 each 11 Not Taking at Unknown time  . nateglinide  (STARLIX) 120 MG tablet Take 1 tablet (120 mg total) by mouth 3 (three) times daily with meals. (Patient not taking: Reported on 07/08/2017) 90 tablet 6 Not Taking at Unknown time  . potassium chloride SA (K-DUR,KLOR-CON) 20 MEQ tablet Take 1 tablet by mouth daily.   Not Taking at Unknown time    Inpatient Medications:  . aspirin EC  325 mg Oral Daily  . aspirin  325 mg Oral Once  . ezetimibe  10 mg Oral Daily  . hydrALAZINE  25 mg Oral TID  . insulin glargine  25 Units Subcutaneous Q2200    Allergies:  Allergies  Allergen Reactions  . Bactrim [Sulfamethoxazole-Trimethoprim] Hives, Itching and Swelling  . Penicillins     Has patient had a PCN reaction causing immediate rash, facial/tongue/throat swelling, SOB or lightheadedness with hypotension: Unk Has patient had a PCN reaction causing severe rash involving mucus membranes or skin necrosis: Unk Has patient had a PCN reaction that required hospitalization: Unk Has patient had a PCN reaction occurring within the last 10 years: No If all of the above answers are "NO", then may proceed with Cephalosporin use.  . Statins     myalgia  . Sulfa Drugs Cross Reactors Hives, Itching and Swelling    Social History   Socioeconomic History  . Marital status: Married    Spouse name: Not on file  . Number of children: Not on file  . Years of education: Not on file  . Highest education level: Not on file  Social Needs  . Financial resource strain: Not on file  . Food insecurity - worry: Not on file  . Food insecurity - inability: Not on file  . Transportation needs - medical: Not on file  . Transportation needs - non-medical: Not on file  Occupational History  . Not on file  Tobacco Use  . Smoking status: Never Smoker  . Smokeless tobacco: Never Used  Substance and Sexual Activity  . Alcohol use: No  . Drug use: No  . Sexual activity: Not on file  Other Topics Concern  . 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 Likes to go to football games.      Family History  Problem Relation Age of Onset  . Hypertension Mother   . Diabetes Mother   . Stroke Father   . Stroke Maternal Grandmother       Review of Systems: All other systems reviewed and are otherwise negative except as noted above.  Physical Exam: Vitals:   07/10/17 2335 07/11/17 0054 07/11/17 0351 07/11/17 0635  BP: (!) 219/102 (!) 184/70 (!) 169/91 (!) 167/81  Pulse:  87 94 87  Resp:  18 18 18   Temp:  98.4 F (36.9 C) 98.3 F (36.8 C) 99.6 F (37.6 C)  TempSrc:  Oral Oral Oral  SpO2:  99% 98% 99%  Weight:      Height:        GEN- The patient is well appearing, alert and oriented x 3 today.   Head- normocephalic, atraumatic Eyes-  Sclera clear, conjunctiva pink Ears- hearing intact Oropharynx- clear Neck- supple Lungs-  CTA b/l, normal work of breathing Heart-  RRR, no murmurs, rubs or gallops  GI- soft, NT, ND Extremities- no clubbing, cyanosis, or edema MS- no significant deformity or atrophy Skin- no rash or lesion Psych- euthymic mood, full affect   Labs:   Lab Results  Component Value Date   WBC 7.7 07/08/2017   HGB 12.6 (L) 07/08/2017   HCT 37.0 (L) 07/08/2017   MCV 78.2 07/08/2017   PLT 236 07/08/2017    Recent Labs  Lab 07/09/17 0327  NA 135  K 3.0*  CL 105  CO2 20*  BUN 23*  CREATININE 2.21*  CALCIUM 8.8*  PROT 6.5  BILITOT 1.0  ALKPHOS 88  ALT 14*  AST 19  GLUCOSE 146*   No results found for: CKTOTAL, CKMB, CKMBINDEX, TROPONINI Lab Results  Component Value Date   CHOL 279 (H) 07/09/2017   CHOL 276 (H) 06/02/2015   CHOL 244 (H) 10/21/2013   Lab Results  Component Value Date   HDL 36 (L) 07/09/2017   HDL 37.50 (L) 06/02/2015   HDL 39.00 (L) 10/21/2013   Lab Results  Component Value Date   LDLCALC 212 (H) 07/09/2017   LDLCALC 199 (H) 06/02/2015     LDLCALC 179 (H) 10/21/2013   Lab Results  Component Value Date   TRIG 156 (H) 07/09/2017   TRIG 199.0 (H) 06/02/2015   TRIG 132.0 10/21/2013   Lab Results  Component Value Date   CHOLHDL 7.8 07/09/2017   CHOLHDL 7 06/02/2015   CHOLHDL 6 10/21/2013   Lab Results  Component Value Date   LDLDIRECT 180.7 06/24/2013   LDLDIRECT 103.6 12/20/2011   LDLDIRECT 125.8 07/21/2011      Radiology/Studies:   Ct Angio Head W Or Wo Contrast Result Date: 07/08/2017 CLINICAL DATA:  Expressive aphasia and RIGHT-sided weakness, improving. EXAM: CT ANGIOGRAPHY HEAD AND NECK TECHNIQUE: Multidetector CT imaging of the head and neck was performed using the standard protocol during bolus administration of intravenous contrast. Multiplanar CT image reconstructions and MIPs were obtained to evaluate the vascular anatomy. Carotid stenosis measurements (when applicable) are obtained utilizing NASCET criteria, using the distal internal carotid diameter as the denominator. CONTRAST:  50 mL ISOVUE-370 IOPAMIDOL (ISOVUE-370) INJECTION 76% COMPARISON:  CT perfusion reported separately. Noncontrast CT head reported earlier. FINDINGS: CTA NECK Aortic arch: Standard branching. Imaged portion shows no evidence of aneurysm or dissection. No significant stenosis of the major arch vessel origins. Right carotid system: Calcified and noncalcified plaque at the bifurcation. No evidence of dissection, stenosis (50% or greater) or occlusion. Left carotid system: Calcified and particularly noncalcified plaque at the bifurcation. Lipid laden soft plaque extends from the bifurcation nearly 2 cm into cervical ICA, see sagittal image 148 series 14. No evidence of frank dissection, stenosis (50% or greater) or occlusion. Vertebral arteries: BILATERAL patent, LEFT slightly larger. No evidence of dissection, stenosis (50% or greater) or occlusion. Nonvascular soft tissues:  Noncontributory. CTA HEAD Anterior circulation: The skullbase,  cavernous, and supraclinoid ICA segments appear widely patent. There is moderate irregularity of the A1 ACA on the LEFT, non worrisome given the dominant RIGHT A1 ACA. No MCA stenosis or occlusion. No significant  stenosis, proximal occlusion, aneurysm, or vascular malformation. Posterior circulation: Both vertebral arteries contribute to basilar formation, LEFT dominant. Focal narrowing RIGHT P1 PCA estimated 50%. No significant stenosis, proximal occlusion, aneurysm, or vascular malformation. Venous sinuses: As permitted by contrast timing, patent. Anatomic variants: None of significance. Delayed phase:  Not performed. Review of the MIP images confirms the above findings. IMPRESSION: No extracranial flow reducing stenosis, but lipid laden plaque does extend for almost 2 cm into the LEFT ICA, areas concerning as a source of recurrent/distal emboli. See discussion above. Findings discussed with ordering provider. Non stenotic calcified and non calcified plaque at both bifurcations. No intracranial large vessel occlusion or significant proximal intracranial anterior circulation stenosis. Electronically Signed   By: Staci Righter M.D.   On: 07/08/2017 09:58    Mr Brain Wo Contrast Result Date: 07/08/2017 CLINICAL DATA:  Confusion with expressive aphasia. This began earlier today. EXAM: MRI HEAD WITHOUT CONTRAST TECHNIQUE: Multiplanar, multiecho pulse sequences of the brain and surrounding structures were obtained without intravenous contrast. COMPARISON:  CT head without contrast, CT perfusion, and CTA head neck, all performed earlier today. FINDINGS: The patient was unable to remain motionless for the exam. Small or subtle lesions could be overlooked. Brain: Multifocal areas of restricted diffusion, corresponding low ADC, affect the LEFT hemisphere within the LEFT MCA territory. Largest confluent area involves the LEFT frontal cortex and subcortical white matter. Additional smaller areas are seen in the LEFT  anterior frontal cortex, LEFT posterior frontal cortex, LEFT superior insular ribbon, and LEFT periventricular white matter. No hemorrhage, mass lesion, hydrocephalus, or extra-axial fluid. Vascular: Flow voids are maintained.  No large vessel occlusion. Skull and upper cervical spine: Normal marrow signal. Sinuses/Orbits: Grossly negative. Other: None. IMPRESSION: Multifocal areas of restricted diffusion consistent with acute nonhemorrhagic infarction affecting the LEFT MCA territory. Largest confluent area affects the LEFT frontal cortex and subcortical white matter.Shower of emboli is suspected. Electronically Signed   By: Staci Righter M.D.   On: 07/08/2017 14:49   Ct Cerebral Perfusion W Contrast Result Date: 07/08/2017 CLINICAL DATA:  Code stroke. Focal neuro deficit less than 6 hours. Transient ischemic attack versus hypertensive encephalopathy. Stroke risk factors include hypertension and diabetes. Expressive aphasia and RIGHT-sided weakness, improving exam. Initial systolic blood pressure to 40 is improving. EXAM: CT PERFUSION BRAIN TECHNIQUE: Multiphase CT imaging of the brain was performed following IV bolus contrast injection. Subsequent parametric perfusion maps were calculated using RAPID software. CONTRAST:  50 mL ISOVUE-370 IOPAMIDOL (ISOVUE-370) INJECTION 76% COMPARISON:  Code stroke CT earlier in the day FINDINGS: CT Brain Perfusion Findings: CBF (<30%) Volume: 50mL Perfusion (Tmax>6.0s) volume: 7mL Mismatch Volume: 59mL Mismatch location:  LEFT posterior frontal cortex. IMPRESSION: No core infarct is demonstrated. CBF (< 30%) volume: 0 mL. There is a small area of perfusion mismatch LEFT posterior frontal cortex. Electronically Signed   By: Staci Righter M.D.   On: 07/08/2017 09:36   Ct Head Code Stroke Wo Contrast Result Date: 07/08/2017 CLINICAL DATA:  Code stroke.  RIGHT-sided weakness with  aphasia. EXAM: CT HEAD WITHOUT CONTRAST TECHNIQUE: Contiguous axial images were obtained from the base  of the skull through the vertex without intravenous contrast. COMPARISON:  None. FINDINGS: Brain: No evidence of acute infarction, hemorrhage, hydrocephalus, extra-axial collection or mass lesion/mass effect. Normal for age cerebral volume. Mild hypoattenuation of the white matter, possible small vessel disease. Vascular: No hyperdense vessel or unexpected calcification. Skull: Normal. Negative for fracture or focal lesion. Sinuses/Orbits: No acute finding. Other: None. ASPECTS [facility] Stroke  Program Early CT Score) - Ganglionic level infarction (caudate, lentiform nuclei, internal capsule, insula, M1-M3 cortex): 7 - Supraganglionic infarction (M4-M6 cortex): 3 Total score (0-10 with 10 being normal): 10 IMPRESSION: 1. Negative exam 2. ASPECTS is 10. These results were communicated to Dr. Lorraine Lax At 8:59 amon 1/5/2019by text page via the [facility] messaging system. Electronically Signed   By: Staci Righter M.D.   On: 07/08/2017 09:02    12-lead ECG SR All prior EKG's in EPIC reviewed with no documented atrial fibrillation  Telemetry SR, infrequent single PVCs only  Assessment and Plan:  1. Cryptogenic stroke The patient presents with cryptogenic stroke.  The patient has a TEE planned for this AM.  I spoke at length with the patient about monitoring for afib with either a 30 day event monitor or an implantable loop recorder.  Risks, benefits, and alteratives to implantable loop recorder were discussed with the patient today.   At this time, the patient is very clear in their decision to proceed with implantable loop recorder.   Wound care was reviewed with the patient (keep incision clean and dry for 3 days).    Please call with questions.   Renee Dyane Dustman, PA-C 07/11/2017  I have seen, examined the patient, and reviewed the above assessment and plan.  Changes to above are made where necessary.  On exam, RRR.  Pt with acute stroke.  I agree with Dr Leonie Man that TEE and ILR are indicated.  Risks and  benefits to ILR were discussed with the patient and his family who wish to proceed at this time.  Co Sign: Thompson Grayer, MD 07/11/2017 7:53 AM

## 2017-07-11 NOTE — Progress Notes (Signed)
SLP Cancellation Note  Patient Details Name: DONNAVAN COVAULT MRN: [id number] DOB: 1945/12/30   Cancelled treatment:       Reason Eval/Treat Not Completed: Patient at procedure or test/unavailable.  Pt undergoing TEE and unavailable for treatment.  SLP will follow up as pt is available.     Trevel Dillenbeck, Selinda Orion 07/11/2017, 8:56 AM

## 2017-07-11 NOTE — Progress Notes (Signed)
RN called [facility] to give report. Rehab nurse is unable to get report now. Rehab nurse will call [facility] back for report.

## 2017-07-11 NOTE — PMR Pre-admission (Signed)
PMR Admission Coordinator Pre-Admission Assessment  Patient: ESGAR BARNICK is an 72 y.o., male MRN: [id number] DOB: 08-13-1945 Height: 6\' 3"  (190.5 cm) Weight: 111.1 kg (244 lb 14.9 oz)              Insurance Information HMO:     PPO: X     PCP:      IPA:      80/20:      OTHER:  PRIMARY: UHC Medicare       Policy#: [id number]      Subscriber: Self CM Name: Vevelyn Royals       Phone#: [phone]     Fax#: [phone] Pre-Cert#: [id number] given 07/11/17 by Orvan July      Employer: Retired  Benefits:  Phone #: [phone]     Name: Verified online at [email or web address] Eff. Date: 07/04/17     Deduct: $0       Out of Pocket Max: $4500      Life Max: N/A CIR: $345 a day, days 1-5; $0 a day, days 6+      SNF: $0 a day, days 1-20; $160 a day, days 21-49; $0 a day, days 50-100 Outpatient: PT/OT/SLP, necessity     Co-Pay: $40 per visit  Home Health: PT/OT/SLP, necessity, 100%      Co-Pay: none DME: 80%     Co-Pay: 20% Providers: In-network   SECONDARY: None      Policy#:       Subscriber:  CM Name:       Phone#:      Fax#:  Pre-Cert#:       Employer:  Benefits:  Phone #:      Name:  Eff. 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 BP elevated in ED and CT head negative of for hemorrhage. CTA/Perfusion brain showed lipid laden plaques extending almost 2 cm  into L-ICA concerning for recurrent/distal emboli. MRI brains showed multifocal areas of restricted diffusion in L-MCA territory--larges in left frontal cortex and subcortical white matter felt to be embolic. 2D echo showed EF 55-60% with no wall abnormality. Carotid dopplers without significant ICA stenosis.   On ASA for secondary stroke prevention and Vascular consult recommended for L-ICA plaque.  TEE done today and loop recorder placed for work up  Speech therapy evaluation revealed expressive > receptive aphasia probably due to verbal apraxia.Patient limited by right sided weakness with inattention, difficulty processing higher levels commands, apraxia and aphasia.Therapy evaluations completed 07/10/17 and CIR recommended due to functional deficits. Patient admitted to Inpatient Rehabilitation 07/11/17.   NIH Total: 3    Past Medical History  Past Medical History:  Diagnosis Date  . DIABETES MELLITUS, TYPE II 01/02/2007  . HYPERLIPIDEMIA 06/18/2007  . HYPERTENSION 01/02/2007  . RENAL INSUFFICIENCY 02/05/2008    Family History  family history includes Diabetes in his mother; Hypertension in his mother; Stroke in his father and maternal grandmother.  Prior Rehab/Hospitalizations:  Has the patient had major surgery during 100 days prior to admission? No  Current Medications   Current Facility-Administered Medications:  .  0.9 %  sodium chloride infusion, , Intravenous, Continuous, Kathyrn Drown D, NP, Last Rate: 20 mL/hr at 07/11/17 1242 .  acetaminophen (TYLENOL) tablet 650 mg, 650 mg, Oral, Q4H PRN **OR** acetaminophen (TYLENOL) solution 650 mg, 650 mg, Per Tube, Q4H PRN **OR** acetaminophen (TYLENOL) suppository 650 mg, 650 mg, Rectal, Q4H PRN, Verlon Au, Jai-Gurmukh, MD .  artificial tears (LACRILUBE) ophthalmic ointment, , Both Eyes, Q4H PRN, Nita Sells, MD .  Derrill Memo ON 07/12/2017] aspirin EC tablet 325 mg, 325 mg, Oral, Daily, Samtani, Jai-Gurmukh, MD .  cyclobenzaprine  (FLEXERIL) tablet 10 mg, 10 mg, Oral, TID PRN, Nita Sells, MD .  Derrill Memo ON 07/12/2017] ezetimibe (ZETIA) tablet 10 mg, 10 mg, Oral, Daily, Samtani, Jai-Gurmukh, MD .  hydrALAZINE (APRESOLINE) injection 10 mg, 10 mg, Intravenous, Q4H PRN, Samtani, Jai-Gurmukh, MD .  hydrALAZINE (APRESOLINE) tablet 25 mg, 25 mg, Oral, TID, Samtani, Jai-Gurmukh, MD .  insulin glargine (LANTUS) injection 25 Units, 25 Units, Subcutaneous, Q2200, Samtani, Jai-Gurmukh, MD .  senna-docusate (Senokot-S) tablet 1 tablet, 1 tablet, Oral, QHS PRN, Nita Sells, MD  Patients Current Diet: Diet heart healthy/carb modified Room service appropriate? Yes; Fluid consistency: Thin Diet - low sodium heart healthy  Precautions / Restrictions Precautions Precautions: Fall Precaution Comments: up with assist Restrictions Weight Bearing Restrictions: No Other Position/Activity Restrictions: R sided weakness and neglect   Has the patient had 2 or more falls or a fall with injury in the past year?No  Prior Activity Level Community (5-7x/wk): Prior to admission paient was fully independent and driving.  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    Home Assistive Devices / Equipment Home Assistive Devices/Equipment: Eyeglasses  Prior Device Use: Indicate devices/aids used by the patient prior to current illness, exacerbation or injury? None of the above  Prior Functional Level Prior Function Level of Independence: Independent  Self Care: Did the patient need help bathing, dressing, using the toilet or eating? Independent  Indoor Mobility: Did the patient need assistance with walking from room to room (with or without device)? Independent  Stairs: Did the patient need assistance with internal or external stairs (with or without device)? Independent  Functional Cognition: Did the patient need  help planning regular tasks such as shopping or remembering to take medications? Independent  Current Functional Level Cognition  Arousal/Alertness: Awake/alert Overall Cognitive Status: Impaired/Different from baseline Difficult to assess due to: Impaired communication Orientation Level: Oriented X4 Following Commands: Follows multi-step commands inconsistently, Follows one step commands with increased time General Comments: Pt initially not able to state DOB, able to state his age with increased time; Upon exiting and returning to room Pt able to state his DOB with increased time/effort, with min assist for word finding when stating the year  Attention: Sustained Sustained Attention: Appears intact    Extremity Assessment (includes Sensation/Coordination)  Upper Extremity Assessment: RUE deficits/detail RUE Deficits / Details: mild decreased initiation and +/-3 out of 5 throughout.  family reports cueing pt  to use extremity. daughter reports pt with R arm dangling from recliner earlier, cues to correct.  RUE Coordination: decreased fine motor  Lower Extremity Assessment: Defer to PT evaluation RLE Deficits / Details: mildly slowed initiation with some residual neglect similar to RUE but able to transition to stand and walk without buckling or tripping    ADLs  Overall ADL's : Needs assistance/impaired Eating/Feeding: Set up, Sitting Eating/Feeding Details (indicate cue type and reason): rest breaks likely needed for feeding with RUE. Daughter reports only minimal spillage (Jell-o) during lunch.  Grooming: Set up, Wash/dry face Grooming Details (indicate cue type and reason): setup assist provided as Pt with watering eyes; issued built up handle and educated Pt/[name] on it's uses to increase independence during self-feeding and grooming tasks  Upper Body Bathing: Moderate assistance, Sitting Lower Body Bathing: Moderate assistance, Sit to/from stand Upper Body Dressing : Moderate  assistance, Sitting Lower Body Dressing: Moderate assistance, Sit to/from stand Toilet Transfer: Minimal assistance, Ambulation, RW, BSC Toileting- Clothing Manipulation and Hygiene: Moderate assistance, Sit to/from stand Tub/ Shower Transfer: Moderate assistance Functional mobility during ADLs: Minimal assistance, Rolling walker General ADL Comments: Pt with increased BP this afternoon (212/103) therefore OOB activity deferred at this time. Pt having increasingly watering eyes, provided washcloth for bed level grooming; issued built up handle for ADL completion and education provided on its uses; Pt participated in [facility] activities with minA for completion;    Mobility  Overal bed mobility: Needs Assistance Bed Mobility: Rolling, Sidelying to Sit Rolling: Supervision Sidelying to sit: Supervision General bed mobility comments: increased time, definite use of bed rail, no physical assist.    Transfers  Overall transfer level: Needs assistance Equipment used: None Transfers: Sit to/from Stand Sit to Stand: Min assist General transfer comment: minA for safety    Ambulation / Gait / Stairs / Wheelchair Mobility  Ambulation/Gait Ambulation/Gait assistance: Museum/gallery curator (Feet): 250 Feet Assistive device: None(held onto gait belt) Gait Pattern/deviations: Step-through pattern, Drifts right/left General Gait Details: pt hovers to R side and consistantly near misses objects on the right side. Pt unable to navigate the hallways with simple R/L directions Gait velocity: slow Gait velocity interpretation: Below normal speed for age/gender Stairs: Yes Stairs assistance: Min assist Stair Management: One rail Right, Alternating pattern Number of Stairs: 3 General stair comments: pt with trip up stairs due to inability to clear R foot requiring minA to maintain balance    Posture / Balance Balance Overall balance assessment: Needs assistance Sitting-balance support:  Bilateral upper extremity supported Sitting balance-Leahy Scale: Good Standing balance support: No upper extremity supported, During functional activity Standing balance-Leahy Scale: Fair Standing balance comment: minA to maintain balance during pertabations Standardized Balance Assessment Standardized Balance Assessment : Dynamic Gait Index Dynamic Gait Index Level Surface: Mild Impairment Change in Gait Speed: Mild Impairment Gait with Horizontal Head Turns: Mild Impairment Gait with Vertical Head Turns: Mild Impairment Gait and Pivot Turn: Mild Impairment Step Over Obstacle: Mild Impairment Step Around Obstacles: Moderate Impairment Steps: Moderate Impairment Total Score: 14    Special needs/care consideration BiPAP/CPAP: No CPM: No Continuous Drip IV: No Dialysis: No         Life Vest: No Oxygen: No Special Bed: No Trach Size: No Wound Vac (area): No       Skin: Spouse report areas of discoloration on feet and toe nails  Bowel mgmt: None since PTA; last BM 07/07/17 Bladder mgmt: Continent  Diabetic mgmt: Yes     Previous Home Environment Living Arrangements: Spouse/significant other  Lives With: Spouse Available Help at Discharge: Family Type of Home: House Home Layout: Two level, Able to live on main level with bedroom/bathroom Home Access: Stairs to enter Entrance Stairs-Number of Steps: 2 [place]: No  Discharge Living Setting Plans for Discharge Living Setting: Patient's home, Lives with (comment)(Spouse) Type of Home at Discharge: House Discharge Home Layout: Two level, Able to live on main level with bedroom/bathroom Alternate Level Stairs-Number of Steps: 1 flight  Discharge Home Access: Stairs to enter Entrance Stairs-Rails: Can reach both Entrance Stairs-Number of Steps: 4, 1  Discharge Bathroom Shower/Tub: Tub/shower unit, Curtain Discharge Bathroom Toilet: Standard Discharge Bathroom Accessibility: Yes How  Accessible: Accessible via walker Does the patient have any problems obtaining your medications?: No  Social/Family/Support Systems Patient Roles: Spouse, Parent Contact Information: Spouse: Social worker  Anticipated Caregiver: Spouse as needed Anticipated Ambulance person Information: Cell:[phone] Ability/Limitations of Caregiver: Limited ability to physically assist  Caregiver Availability: 24/7 Discharge Plan Discussed with Primary Caregiver: Yes Is Caregiver In Agreement with Plan?: Yes Does Caregiver/Family have Issues with Lodging/Transportation while Pt is in Rehab?: No  Goals/Additional Needs Patient/Family Goal for Rehab: PT/OT: Mod I, SLP: Mod I-Min A Expected length of stay: 11-15 days  Cultural Considerations: Attends and is active in church community; minister is aware he is here and may visit  Dietary Needs: Heart healthty and carb mod diet restrictions  Equipment Needs: TBD Pt/Family Agrees to Admission and willing to participate: Yes Program Orientation Provided & Reviewed with Pt/Caregiver Including Roles  & Responsibilities: Yes  Decrease burden of Care through IP rehab admission: No  Possible need for SNF placement upon discharge: No  Patient Condition: This patient's condition remains as documented in the consult dated 07/10/17 at 11:42 am, in which the Rehabilitation Physician determined and documented that the patient's condition is appropriate for intensive rehabilitative care in an inpatient rehabilitation facility. Will admit to inpatient rehab today.  Preadmission Screen Completed By:  Gunnar Fusi, 07/11/2017 1:43 PM ______________________________________________________________________   Discussed status with Dr. Naaman Plummer on 07/11/17 at 1345 and received telephone approval for admission today.  Admission Coordinator:  Gunnar Fusi, time 1345/Date 07/11/17

## 2017-07-11 NOTE — Progress Notes (Signed)
Inpatient Rehabilitation  Met with patient and spouse at bedside to discuss team's recommendation for IP Rehab.  Shared booklets, insurance verification letter, and answered questions.  I have received acute medical clearance as well as insurance authorization.  Plan to proceed with admission today.  Discussed with team.  Call if questions.   Melissa Bowie, M.A., CCC/SLP Admission Coordinator  [facility]  Cell [phone]   

## 2017-07-11 NOTE — Progress Notes (Signed)
Pt admitted to 4M09 with no pain. Wife and all belongings are at bedside Continue plan of care.

## 2017-07-11 NOTE — Interval H&P Note (Signed)
History and Physical Interval Note:  07/11/2017 10:50 AM  [name] [name]  has presented today for surgery, with the diagnosis of stroke  The various methods of treatment have been discussed with the patient and family. After consideration of risks, benefits and other options for treatment, the patient has consented to  Procedure(s): LOOP RECORDER INSERTION (N/A) as a surgical intervention .  The patient's history has been reviewed, patient examined, no change in status, stable for surgery.  I have reviewed the patient's chart and labs.  Questions were answered to the patient's satisfaction.     Thompson Grayer

## 2017-07-11 NOTE — Progress Notes (Signed)
  Echocardiogram 2D Echocardiogram has been performed.  [name] [name] T Marlos [name] 07/11/2017, 9:58 AM

## 2017-07-11 NOTE — Care Management Note (Signed)
Case Management Note  Patient Details  Name: [name] [name] MRN: [id number] Date of Birth: [birth year]  Subjective/Objective:   Pt admitted with CVA. He is from home with his spouse.                  Action/Plan: Pt is discharging to CIR. No further needs per CM.   Expected Discharge Date:  07/11/17               Expected Discharge Plan:  [place]  In-House Referral:     Discharge planning Services  CM Consult  Post Acute Care Choice:    Choice offered to:     DME Arranged:    DME Agency:     HH Arranged:    HH Agency:     Status of Service:  Completed, signed off  If discussed at [employer], dates discussed:    Additional Comments:  Pollie Friar, RN 07/11/2017, 1:07 PM

## 2017-07-11 NOTE — Discharge Instructions (Signed)
Implant site care Keep incision clean and dry for 3 days.  You can remove outer dressing tomorrow. Leave steri-strips (little pieces of tape) on until seen in the office for wound check appointment. Call the office [phone]) for redness, drainage, swelling, or fever.

## 2017-07-11 NOTE — Consult Note (Signed)
ELECTROPHYSIOLOGY CONSULT NOTE  Patient ID: [name] [name] MRN: [id number], DOB/AGE: 1946-07-02   Admit date: 07/08/2017 Date of Consult: 07/11/2017  Primary Physician: Dorothyann Peng, NP Primary Cardiologist: none Reason for Consultation: Cryptogenic stroke ; recommendations regarding Implantable Loop Recorder requested by Dr. Leonie Man  History of Present Illness [name] [name] was admitted on 07/08/2017 with acute CVA.   PMHx noted for DM, CRI (III), HTN, HLD.  Hefirst developed symptoms of aphasia, confusion, r sided weakness while at home.  Imaging demonstrated Left middle cerebral artery territory embolic infarcts - possibly from the left ICA.   he has undergone workup for stroke including echocardiogram and carotid angio.  The patient has been monitored on telemetry which has demonstrated sinus rhythm with no arrhythmias.  Inpatient stroke work-up is to be completed with a TEE.   Echocardiogram this admission demonstrated   Study Conclusions - Left ventricle: The cavity size was mildly dilated. Wall   thickness was increased in a pattern of mild LVH. Systolic   function was normal. The estimated ejection fraction was in the   range of 55% to 60%. Wall motion was normal; there were no   regional wall motion abnormalities. Doppler parameters are   consistent with abnormal left ventricular relaxation (grade 1   diastolic dysfunction). - Left atrium: The atrium was mildly dilated.   Lab work is reviewed.  Prior to admission, the patient denies chest pain, shortness of breath, dizziness, palpitations, or syncope.  They are recovering from their stroke.   Past Medical History:  Diagnosis Date  . DIABETES MELLITUS, TYPE II 01/02/2007  . HYPERLIPIDEMIA 06/18/2007  . HYPERTENSION 01/02/2007  . RENAL INSUFFICIENCY 02/05/2008     Surgical History:  Past Surgical History:  Procedure Laterality Date  . SHOULDER SURGERY     left     Medications Prior to Admission  Medication Sig  Dispense Refill Last Dose  . amLODipine (NORVASC) 10 MG tablet Take 1 tablet (10 mg total) by mouth daily. 30 tablet 0 07/07/2017 at Unknown time  . benazepril (LOTENSIN) 40 MG tablet Take 1 tablet (40 mg total) by mouth 2 (two) times daily. 60 tablet 0 07/07/2017 at Unknown time  . furosemide (LASIX) 80 MG tablet 160 mg po q a.m and 80 mg po q afternoon 270 tablet 3 07/07/2017 at Unknown time  . hydrALAZINE (APRESOLINE) 25 MG tablet Take 1 tablet (25 mg total) by mouth 3 (three) times daily. 90 tablet 0 07/07/2017 at Unknown time  . insulin aspart (NOVOLOG) 100 UNIT/ML injection Take 6 units with every meal unless blood sugar above 200 than take 10 units (Patient taking differently: Inject 12 Units into the skin 3 (three) times daily with meals. ) 3 vial 11 07/07/2017 at Unknown time  . Insulin Glargine (LANTUS SOLOSTAR) 100 UNIT/ML Solostar Pen INJECT 20 UNITS SUBCUTANEOUSLY ONCE DAILY. NEED TO MAKE APPOINTMENT. (Patient taking differently: Inject 25 Units into the skin daily at 10 pm. ) 2 pen 0 07/07/2017 at Unknown time  . labetalol (NORMODYNE) 200 MG tablet Take 1 tablet (200 mg total) by mouth 2 (two) times daily. 180 tablet 0 07/07/2017 at 1900  . calcitRIOL (ROCALTROL) 0.25 MCG capsule Take 1 capsule by mouth daily.   Not Taking at Unknown time  . cyclobenzaprine (FLEXERIL) 10 MG tablet Take 1 tablet (10 mg total) by mouth 3 (three) times daily as needed for muscle spasms. 30 tablet 0 PRN  . fluvastatin (LESCOL) 40 MG capsule Take 1 capsule (40 mg total)  by mouth at bedtime. (Patient not taking: Reported on 07/08/2017) 30 capsule 11 Not Taking at Unknown time  . glucose blood (ONETOUCH VERIO) test strip 1 each by Other route daily. Use as instructed (Patient not taking: Reported on 07/08/2017) 100 each 11 Not Taking at Unknown time  . Insulin Pen Needle (B-D UF III MINI PEN NEEDLES) 31G X 5 MM MISC USE AS DIRECTED (Patient not taking: Reported on 07/08/2017) 100 each 11 Not Taking at Unknown time  . nateglinide  (STARLIX) 120 MG tablet Take 1 tablet (120 mg total) by mouth 3 (three) times daily with meals. (Patient not taking: Reported on 07/08/2017) 90 tablet 6 Not Taking at Unknown time  . potassium chloride SA (K-DUR,KLOR-CON) 20 MEQ tablet Take 1 tablet by mouth daily.   Not Taking at Unknown time    Inpatient Medications:  . aspirin EC  325 mg Oral Daily  . aspirin  325 mg Oral Once  . ezetimibe  10 mg Oral Daily  . hydrALAZINE  25 mg Oral TID  . insulin glargine  25 Units Subcutaneous Q2200    Allergies:  Allergies  Allergen Reactions  . Bactrim [Sulfamethoxazole-Trimethoprim] Hives, Itching and Swelling  . Penicillins     Has patient had a PCN reaction causing immediate rash, facial/tongue/throat swelling, SOB or lightheadedness with hypotension: Unk Has patient had a PCN reaction causing severe rash involving mucus membranes or skin necrosis: Unk Has patient had a PCN reaction that required hospitalization: Unk Has patient had a PCN reaction occurring within the last 10 years: No If all of the above answers are "NO", then may proceed with Cephalosporin use.  . Statins     myalgia  . Sulfa Drugs Cross Reactors Hives, Itching and Swelling    Social History   Socioeconomic History  . Marital status: Married    Spouse name: Not on file  . Number of children: Not on file  . Years of education: Not on file  . Highest education level: Not on file  Social Needs  . Financial resource strain: Not on file  . Food insecurity - worry: Not on file  . Food insecurity - inability: Not on file  . Transportation needs - medical: Not on file  . Transportation needs - non-medical: Not on file  Occupational History  . Not on file  Tobacco Use  . Smoking status: Never Smoker  . Smokeless tobacco: Never Used  Substance and Sexual Activity  . Alcohol use: No  . Drug use: No  . Sexual activity: Not on file  Other Topics Concern  . 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 Likes to go to football games.      Family History  Problem Relation Age of Onset  . Hypertension Mother   . Diabetes Mother   . Stroke Father   . Stroke Maternal Grandmother       Review of Systems: All other systems reviewed and are otherwise negative except as noted above.  Physical Exam: Vitals:   07/10/17 2335 07/11/17 0054 07/11/17 0351 07/11/17 0635  BP: (!) 219/102 (!) 184/70 (!) 169/91 (!) 167/81  Pulse:  87 94 87  Resp:  18 18 18   Temp:  98.4 F (36.9 C) 98.3 F (36.8 C) 99.6 F (37.6 C)  TempSrc:  Oral Oral Oral  SpO2:  99% 98% 99%  Weight:      Height:        GEN- The patient is well appearing, alert and oriented x 3 today.   Head- normocephalic, atraumatic Eyes-  Sclera clear, conjunctiva pink Ears- hearing intact Oropharynx- clear Neck- supple Lungs-  CTA b/l, normal work of breathing Heart-  RRR, no murmurs, rubs or gallops  GI- soft, NT, ND Extremities- no clubbing, cyanosis, or edema MS- no significant deformity or atrophy Skin- no rash or lesion Psych- euthymic mood, full affect   Labs:   Lab Results  Component Value Date   WBC 7.7 07/08/2017   HGB 12.6 (L) 07/08/2017   HCT 37.0 (L) 07/08/2017   MCV 78.2 07/08/2017   PLT 236 07/08/2017    Recent Labs  Lab 07/09/17 0327  NA 135  K 3.0*  CL 105  CO2 20*  BUN 23*  CREATININE 2.21*  CALCIUM 8.8*  PROT 6.5  BILITOT 1.0  ALKPHOS 88  ALT 14*  AST 19  GLUCOSE 146*   No results found for: CKTOTAL, CKMB, CKMBINDEX, TROPONINI Lab Results  Component Value Date   CHOL 279 (H) 07/09/2017   CHOL 276 (H) 06/02/2015   CHOL 244 (H) 10/21/2013   Lab Results  Component Value Date   HDL 36 (L) 07/09/2017   HDL 37.50 (L) 06/02/2015   HDL 39.00 (L) 10/21/2013   Lab Results  Component Value Date   LDLCALC 212 (H) 07/09/2017   LDLCALC 199 (H) 06/02/2015     LDLCALC 179 (H) 10/21/2013   Lab Results  Component Value Date   TRIG 156 (H) 07/09/2017   TRIG 199.0 (H) 06/02/2015   TRIG 132.0 10/21/2013   Lab Results  Component Value Date   CHOLHDL 7.8 07/09/2017   CHOLHDL 7 06/02/2015   CHOLHDL 6 10/21/2013   Lab Results  Component Value Date   LDLDIRECT 180.7 06/24/2013   LDLDIRECT 103.6 12/20/2011   LDLDIRECT 125.8 07/21/2011      Radiology/Studies:   Ct Angio Head W Or Wo Contrast Result Date: 07/08/2017 CLINICAL DATA:  Expressive aphasia and RIGHT-sided weakness, improving. EXAM: CT ANGIOGRAPHY HEAD AND NECK TECHNIQUE: Multidetector CT imaging of the head and neck was performed using the standard protocol during bolus administration of intravenous contrast. Multiplanar CT image reconstructions and MIPs were obtained to evaluate the vascular anatomy. Carotid stenosis measurements (when applicable) are obtained utilizing NASCET criteria, using the distal internal carotid diameter as the denominator. CONTRAST:  50 mL ISOVUE-370 IOPAMIDOL (ISOVUE-370) INJECTION 76% COMPARISON:  CT perfusion reported separately. Noncontrast CT head reported earlier. FINDINGS: CTA NECK Aortic arch: Standard branching. Imaged portion shows no evidence of aneurysm or dissection. No significant stenosis of the major arch vessel origins. Right carotid system: Calcified and noncalcified plaque at the bifurcation. No evidence of dissection, stenosis (50% or greater) or occlusion. Left carotid system: Calcified and particularly noncalcified plaque at the bifurcation. Lipid laden soft plaque extends from the bifurcation nearly 2 cm into cervical ICA, see sagittal image 148 series 14. No evidence of frank dissection, stenosis (50% or greater) or occlusion. Vertebral arteries: BILATERAL patent, LEFT slightly larger. No evidence of dissection, stenosis (50% or greater) or occlusion. Nonvascular soft tissues:  Noncontributory. CTA HEAD Anterior circulation: The skullbase,  cavernous, and supraclinoid ICA segments appear widely patent. There is moderate irregularity of the A1 ACA on the LEFT, non worrisome given the dominant RIGHT A1 ACA. No MCA stenosis or occlusion. No significant  stenosis, proximal occlusion, aneurysm, or vascular malformation. Posterior circulation: Both vertebral arteries contribute to basilar formation, LEFT dominant. Focal narrowing RIGHT P1 PCA estimated 50%. No significant stenosis, proximal occlusion, aneurysm, or vascular malformation. Venous sinuses: As permitted by contrast timing, patent. Anatomic variants: None of significance. Delayed phase:  Not performed. Review of the MIP images confirms the above findings. IMPRESSION: No extracranial flow reducing stenosis, but lipid laden plaque does extend for almost 2 cm into the LEFT ICA, areas concerning as a source of recurrent/distal emboli. See discussion above. Findings discussed with ordering provider. Non stenotic calcified and non calcified plaque at both bifurcations. No intracranial large vessel occlusion or significant proximal intracranial anterior circulation stenosis. Electronically Signed   By: Staci Righter M.D.   On: 07/08/2017 09:58    Mr Brain Wo Contrast Result Date: 07/08/2017 CLINICAL DATA:  Confusion with expressive aphasia. This began earlier today. EXAM: MRI HEAD WITHOUT CONTRAST TECHNIQUE: Multiplanar, multiecho pulse sequences of the brain and surrounding structures were obtained without intravenous contrast. COMPARISON:  CT head without contrast, CT perfusion, and CTA head neck, all performed earlier today. FINDINGS: The patient was unable to remain motionless for the exam. Small or subtle lesions could be overlooked. Brain: Multifocal areas of restricted diffusion, corresponding low ADC, affect the LEFT hemisphere within the LEFT MCA territory. Largest confluent area involves the LEFT frontal cortex and subcortical white matter. Additional smaller areas are seen in the LEFT  anterior frontal cortex, LEFT posterior frontal cortex, LEFT superior insular ribbon, and LEFT periventricular white matter. No hemorrhage, mass lesion, hydrocephalus, or extra-axial fluid. Vascular: Flow voids are maintained.  No large vessel occlusion. Skull and upper cervical spine: Normal marrow signal. Sinuses/Orbits: Grossly negative. Other: None. IMPRESSION: Multifocal areas of restricted diffusion consistent with acute nonhemorrhagic infarction affecting the LEFT MCA territory. Largest confluent area affects the LEFT frontal cortex and subcortical white matter.Shower of emboli is suspected. Electronically Signed   By: Staci Righter M.D.   On: 07/08/2017 14:49   Ct Cerebral Perfusion W Contrast Result Date: 07/08/2017 CLINICAL DATA:  Code stroke. Focal neuro deficit less than 6 hours. Transient ischemic attack versus hypertensive encephalopathy. Stroke risk factors include hypertension and diabetes. Expressive aphasia and RIGHT-sided weakness, improving exam. Initial systolic blood pressure to 40 is improving. EXAM: CT PERFUSION BRAIN TECHNIQUE: Multiphase CT imaging of the brain was performed following IV bolus contrast injection. Subsequent parametric perfusion maps were calculated using RAPID software. CONTRAST:  50 mL ISOVUE-370 IOPAMIDOL (ISOVUE-370) INJECTION 76% COMPARISON:  Code stroke CT earlier in the day FINDINGS: CT Brain Perfusion Findings: CBF (<30%) Volume: 61mL Perfusion (Tmax>6.0s) volume: 33mL Mismatch Volume: 54mL Mismatch location:  LEFT posterior frontal cortex. IMPRESSION: No core infarct is demonstrated. CBF (< 30%) volume: 0 mL. There is a small area of perfusion mismatch LEFT posterior frontal cortex. Electronically Signed   By: Staci Righter M.D.   On: 07/08/2017 09:36   Ct Head Code Stroke Wo Contrast Result Date: 07/08/2017 CLINICAL DATA:  Code stroke.  RIGHT-sided weakness with  aphasia. EXAM: CT HEAD WITHOUT CONTRAST TECHNIQUE: Contiguous axial images were obtained from the base  of the skull through the vertex without intravenous contrast. COMPARISON:  None. FINDINGS: Brain: No evidence of acute infarction, hemorrhage, hydrocephalus, extra-axial collection or mass lesion/mass effect. Normal for age cerebral volume. Mild hypoattenuation of the white matter, possible small vessel disease. Vascular: No hyperdense vessel or unexpected calcification. Skull: Normal. Negative for fracture or focal lesion. Sinuses/Orbits: No acute finding. Other: None. ASPECTS [facility] Stroke  Program Early CT Score) - Ganglionic level infarction (caudate, lentiform nuclei, internal capsule, insula, M1-M3 cortex): 7 - Supraganglionic infarction (M4-M6 cortex): 3 Total score (0-10 with 10 being normal): 10 IMPRESSION: 1. Negative exam 2. ASPECTS is 10. These results were communicated to Dr. Lorraine Lax At 8:59 amon 1/5/2019by text page via the [facility] messaging system. Electronically Signed   By: Staci Righter M.D.   On: 07/08/2017 09:02    12-lead ECG SR All prior EKG's in EPIC reviewed with no documented atrial fibrillation  Telemetry SR, infrequent single PVCs only  Assessment and Plan:  1. Cryptogenic stroke The patient presents with cryptogenic stroke.  The patient has a TEE planned for this AM.  I spoke at length with the patient about monitoring for afib with either a 30 day event monitor or an implantable loop recorder.  Risks, benefits, and alteratives to implantable loop recorder were discussed with the patient today.   At this time, the patient is very clear in their decision to proceed with implantable loop recorder.   Wound care was reviewed with the patient (keep incision clean and dry for 3 days).    Please call with questions.   Renee Dyane Dustman, PA-C 07/11/2017  I have seen, examined the patient, and reviewed the above assessment and plan.  Changes to above are made where necessary.  On exam, RRR.  Pt with acute stroke.  I agree with Dr Leonie Man that TEE and ILR are indicated.  Risks and  benefits to ILR were discussed with the patient and his family who wish to proceed at this time.  Co Sign: Thompson Grayer, MD 07/11/2017 7:53 AM

## 2017-07-11 NOTE — Interval H&P Note (Signed)
History and Physical Interval Note:  07/11/2017 9:19 AM  [name] [name]  has presented today for surgery, with the diagnosis of STROKE  The various methods of treatment have been discussed with the patient and family. After consideration of risks, benefits and other options for treatment, the patient has consented to  Procedure(s): TRANSESOPHAGEAL ECHOCARDIOGRAM (TEE) (N/A) as a surgical intervention .  The patient's history has been reviewed, patient examined, no change in status, stable for surgery.  I have reviewed the patient's chart and labs.  Questions were answered to the patient's satisfaction.     [name]

## 2017-07-11 NOTE — Progress Notes (Signed)
PT Cancellation Note  Patient Details Name: [name] [name] MRN: [id number] DOB: [birth year]   Cancelled Treatment:    Reason Eval/Treat Not Completed: Patient at procedure or test/unavailable. Pt off the floor all morning at TEE and then going to cath lab. RN suspects pt to come back with a bed rest order from procedure. PT to return as able to complete treatment.   Christoffer Currier M Evan Mackie 07/11/2017, 11:36 AM   Kittie Plater, PT, DPT Pager #: [phone] Office #: [phone]

## 2017-07-11 NOTE — CV Procedure (Signed)
     Transesophageal Echocardiogram Note  JAMORRIS NDIAYE [id number] [birth year]  Procedure: Transesophageal Echocardiogram Indications: Stroke  Procedure Details Consent: Obtained Time Out: Verified patient identification, verified procedure, site/side was marked, verified correct patient position, special equipment/implants available, Radiology Safety Procedures followed,  medications/allergies/relevent history reviewed, required imaging and test results available.  Performed  The patient also obtained hydralazine 30 mg iv and 10 mg of iv Metoprolol for hypertension 234/112 mmHg.    During this procedure the patient is administered a total of Versed 6 mg mg and Fentanyl 50 mcg mg to achieve and maintain moderate conscious sedation.  The patient's heart rate, blood pressure, and oxygen saturation are monitored continuously during the procedure. The period of conscious sedation is 30 minutes, of which I was present face-to-face 100% of this time.    Complications: No apparent complications Patient did tolerate procedure well.  [name], MD, [facility] 07/11/2017, 9:20 AM

## 2017-07-12 ENCOUNTER — Inpatient Hospital Stay (HOSPITAL_COMMUNITY): Payer: Medicare Other | Admitting: Physical Therapy

## 2017-07-12 ENCOUNTER — Inpatient Hospital Stay (HOSPITAL_COMMUNITY): Payer: Medicare Other | Admitting: Speech Pathology

## 2017-07-12 ENCOUNTER — Inpatient Hospital Stay (HOSPITAL_COMMUNITY): Payer: Medicare Other | Admitting: Occupational Therapy

## 2017-07-12 DIAGNOSIS — N183 Chronic kidney disease, stage 3 unspecified: Secondary | ICD-10-CM

## 2017-07-12 DIAGNOSIS — E1169 Type 2 diabetes mellitus with other specified complication: Secondary | ICD-10-CM

## 2017-07-12 DIAGNOSIS — E669 Obesity, unspecified: Secondary | ICD-10-CM

## 2017-07-12 DIAGNOSIS — I63512 Cerebral infarction due to unspecified occlusion or stenosis of left middle cerebral artery: Secondary | ICD-10-CM | POA: Diagnosis present

## 2017-07-12 DIAGNOSIS — E119 Type 2 diabetes mellitus without complications: Secondary | ICD-10-CM

## 2017-07-12 DIAGNOSIS — I10 Essential (primary) hypertension: Secondary | ICD-10-CM | POA: Insufficient documentation

## 2017-07-12 LAB — COMPREHENSIVE METABOLIC PANEL
ALBUMIN: 3.6 g/dL (ref 3.5–5.0)
ALT: 20 U/L (ref 17–63)
AST: 24 U/L (ref 15–41)
Alkaline Phosphatase: 84 U/L (ref 38–126)
Anion gap: 6 (ref 5–15)
BILIRUBIN TOTAL: 0.9 mg/dL (ref 0.3–1.2)
BUN: 30 mg/dL — ABNORMAL HIGH (ref 6–20)
CO2: 22 mmol/L (ref 22–32)
Calcium: 8.7 mg/dL — ABNORMAL LOW (ref 8.9–10.3)
Chloride: 112 mmol/L — ABNORMAL HIGH (ref 101–111)
Creatinine, Ser: 2.41 mg/dL — ABNORMAL HIGH (ref 0.61–1.24)
GFR calc Af Amer: 30 mL/min — ABNORMAL LOW (ref >60)
GFR calc non Af Amer: 26 mL/min — ABNORMAL LOW (ref >60)
GLUCOSE: 134 mg/dL — AB (ref 65–99)
POTASSIUM: 3.4 mmol/L — AB (ref 3.5–5.1)
Sodium: 140 mmol/L (ref 135–145)
TOTAL PROTEIN: 6.8 g/dL (ref 6.5–8.1)

## 2017-07-12 LAB — CBC WITH DIFFERENTIAL/PLATELET
BASOS ABS: 0 10*3/uL (ref 0.0–0.1)
BASOS PCT: 0 %
Eosinophils Absolute: 0.3 10*3/uL (ref 0.0–0.7)
Eosinophils Relative: 5 %
HEMATOCRIT: 34.3 % — AB (ref 39.0–52.0)
Hemoglobin: 11.8 g/dL — ABNORMAL LOW (ref 13.0–17.0)
Lymphocytes Relative: 23 %
Lymphs Abs: 1.6 10*3/uL (ref 0.7–4.0)
MCH: 26.9 pg (ref 26.0–34.0)
MCHC: 34.4 g/dL (ref 30.0–36.0)
MCV: 78.3 fL (ref 78.0–100.0)
Monocytes Absolute: 0.3 10*3/uL (ref 0.1–1.0)
Monocytes Relative: 5 %
NEUTROS ABS: 4.8 10*3/uL (ref 1.7–7.7)
NEUTROS PCT: 67 %
Platelets: 217 10*3/uL (ref 150–400)
RBC: 4.38 MIL/uL (ref 4.22–5.81)
RDW: 14 % (ref 11.5–15.5)
WBC: 7.1 10*3/uL (ref 4.0–10.5)

## 2017-07-12 LAB — GLUCOSE, CAPILLARY
GLUCOSE-CAPILLARY: 118 mg/dL — AB (ref 65–99)
Glucose-Capillary: 143 mg/dL — ABNORMAL HIGH (ref 65–99)
Glucose-Capillary: 104 mg/dL — ABNORMAL HIGH (ref 65–99)
Glucose-Capillary: 96 mg/dL (ref 65–99)

## 2017-07-12 MED ORDER — INSULIN ASPART 100 UNIT/ML ~~LOC~~ SOLN
0.0000 [IU] | Freq: Three times a day (TID) | SUBCUTANEOUS | Status: DC
  Administered 2017-07-12: 1 [IU] via SUBCUTANEOUS

## 2017-07-12 MED ORDER — DIPHENHYDRAMINE HCL 12.5 MG/5ML PO ELIX: 12.5000 mg | ORAL_SOLUTION | Freq: Four times a day (QID) | ORAL | Status: DC | PRN

## 2017-07-12 MED ORDER — NATEGLINIDE 120 MG PO TABS
120.0000 mg | ORAL_TABLET | Freq: Three times a day (TID) | ORAL | Status: DC
  Administered 2017-07-12 – 2017-07-14 (×7): 120 mg via ORAL
  Filled 2017-07-12 (×8): qty 1

## 2017-07-12 MED ORDER — ACETAMINOPHEN 325 MG PO TABS: 325.0000 mg | ORAL_TABLET | ORAL | Status: DC | PRN

## 2017-07-12 MED ORDER — BISACODYL 10 MG RE SUPP: 10.0000 mg | Freq: Every day | RECTAL | Status: DC | PRN

## 2017-07-12 MED ORDER — FLEET ENEMA 7-19 GM/118ML RE ENEM: 1.0000 | ENEMA | Freq: Once | RECTAL | Status: DC | PRN

## 2017-07-12 MED ORDER — POLYETHYLENE GLYCOL 3350 17 G PO PACK: 17.0000 g | PACK | Freq: Every day | ORAL | Status: DC | PRN

## 2017-07-12 MED ORDER — GUAIFENESIN-DM 100-10 MG/5ML PO SYRP: 5.0000 mL | ORAL_SOLUTION | Freq: Four times a day (QID) | ORAL | Status: DC | PRN

## 2017-07-12 MED ORDER — INSULIN ASPART 100 UNIT/ML ~~LOC~~ SOLN: 0.0000 [IU] | Freq: Every day | SUBCUTANEOUS | Status: DC

## 2017-07-12 MED ORDER — PROCHLORPERAZINE 25 MG RE SUPP: 12.5000 mg | Freq: Four times a day (QID) | RECTAL | Status: DC | PRN

## 2017-07-12 MED ORDER — ALUM & MAG HYDROXIDE-SIMETH 200-200-20 MG/5ML PO SUSP: 30.0000 mL | ORAL | Status: DC | PRN

## 2017-07-12 MED ORDER — PROCHLORPERAZINE MALEATE 5 MG PO TABS: 5.0000 mg | ORAL_TABLET | Freq: Four times a day (QID) | ORAL | Status: DC | PRN

## 2017-07-12 MED ORDER — HYDRALAZINE HCL 25 MG PO TABS
25.0000 mg | ORAL_TABLET | Freq: Three times a day (TID) | ORAL | Status: DC
  Administered 2017-07-12 – 2017-07-13 (×3): 25 mg via ORAL
  Filled 2017-07-12 (×3): qty 1

## 2017-07-12 MED ORDER — PROCHLORPERAZINE EDISYLATE 5 MG/ML IJ SOLN: 5.0000 mg | Freq: Four times a day (QID) | INTRAMUSCULAR | Status: DC | PRN

## 2017-07-12 MED ORDER — ENOXAPARIN SODIUM 40 MG/0.4ML ~~LOC~~ SOLN
40.0000 mg | SUBCUTANEOUS | Status: DC
  Administered 2017-07-12 – 2017-07-15 (×4): 40 mg via SUBCUTANEOUS
  Filled 2017-07-12 (×4): qty 0.4

## 2017-07-12 MED ORDER — BENAZEPRIL HCL 10 MG PO TABS
10.0000 mg | ORAL_TABLET | Freq: Two times a day (BID) | ORAL | Status: DC
  Administered 2017-07-12 (×2): 10 mg via ORAL
  Filled 2017-07-12 (×4): qty 1

## 2017-07-12 MED ORDER — BENAZEPRIL HCL 10 MG PO TABS: 10.0000 mg | ORAL_TABLET | Freq: Two times a day (BID) | ORAL | Status: DC

## 2017-07-12 MED ORDER — BENAZEPRIL HCL 20 MG PO TABS
20.0000 mg | ORAL_TABLET | Freq: Two times a day (BID) | ORAL | Status: DC
  Administered 2017-07-13: 20 mg via ORAL
  Filled 2017-07-12: qty 1

## 2017-07-12 MED ORDER — BENAZEPRIL HCL 10 MG PO TABS
10.0000 mg | ORAL_TABLET | Freq: Once | ORAL | Status: AC
  Administered 2017-07-12: 10 mg via ORAL
  Filled 2017-07-12: qty 1

## 2017-07-12 MED ORDER — TRAZODONE HCL 50 MG PO TABS: 25.0000 mg | ORAL_TABLET | Freq: Every evening | ORAL | Status: DC | PRN

## 2017-07-12 NOTE — Progress Notes (Signed)
PMR Admission Coordinator Pre-Admission Assessment  Patient: [name] [name] is an 72 y.o., male MRN: [id number] DOB: 1945/12/06 Height: 6\' 3"  (190.5 cm) Weight: 111.1 kg (244 lb 14.9 oz)                                                                                                                                                  Insurance Information HMO:     PPO: X     PCP:      IPA:      80/20:      OTHER:  PRIMARY: U[employer] Medicare       Policy#: [id number]      Subscriber: Self CM Name: Vevelyn Royals       Phone#: [phone]     Fax#: [phone] Pre-Cert#: [id number] given 07/11/17 by Orvan July      Employer: Retired  Benefits:  Phone #: [phone]     Name: Verified online at [email or web address] Eff. Date: 07/04/17     Deduct: $0       Out of Pocket Max: $4500      Life Max: N/A CIR: $345 a day, days 1-5; $0 a day, days 6+      SNF: $0 a day, days 1-20; $160 a day, days 21-49; $0 a day, days 50-100 Outpatient: PT/OT/SLP, necessity     Co-Pay: $40 per visit  Home Health: PT/OT/SLP, necessity, 100%      Co-Pay: none DME: 80%     Co-Pay: 20% Providers: In-network   SECONDARY: None      Policy#:       Subscriber:  CM Name:       Phone#:      Fax#:  Pre-Cert#:       Employer:  Benefits:  Phone #:      Name:  Eff. 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 BP elevated in ED and CT head negative of for hemorrhage. CTA/Perfusion brain  showed lipid laden plaques extending almost 2 cm into L-ICA concerning for recurrent/distal emboli. MRI brains showed multifocal areas of restricted diffusion in L-MCA territory--larges in left frontal cortex and subcortical white matter felt to be embolic. 2D echo showed EF 55-60% with no wall abnormality. Carotid dopplers without significant ICA stenosis.   On ASA for secondary stroke prevention andVascular consult recommended for L-ICA plaque. TEE done today and loop recorder placed for work up Speech therapy evaluation revealed expressive > receptive aphasia probably due to verbal apraxia.Patient limited by right sided weakness with inattention, difficulty processing higher levels commands, apraxia and aphasia.Therapy evaluations completed 07/10/17 and CIR recommended due to functional deficits. Patient admitted to Inpatient Rehabilitation 07/11/17.   NIH Total: 3  Past Medical History      Past Medical History:  Diagnosis Date  . DIABETES MELLITUS, TYPE II 01/02/2007  . HYPERLIPIDEMIA 06/18/2007  . HYPERTENSION 01/02/2007  . RENAL INSUFFICIENCY 02/05/2008    Family History  family history includes Diabetes in his mother; Hypertension in his mother; Stroke in his father and maternal grandmother.  Prior Rehab/Hospitalizations:  Has the patient had major surgery during 100 days prior to admission? No  Current Medications   Current Facility-Administered Medications:  .  0.9 %  sodium chloride infusion, , Intravenous, Continuous, Kathyrn Drown D, NP, Last Rate: 20 mL/hr at 07/11/17 1242 .  acetaminophen (TYLENOL) tablet 650 mg, 650 mg, Oral, Q4H PRN **OR** acetaminophen (TYLENOL) solution 650 mg, 650 mg, Per Tube, Q4H PRN **OR** acetaminophen (TYLENOL) suppository 650 mg, 650 mg, Rectal, Q4H PRN, Verlon Au, Jai-Gurmukh, MD .  artificial tears (LACRILUBE) ophthalmic ointment, , Both  Eyes, Q4H PRN, Nita Sells, MD .  Derrill Memo ON 07/12/2017] aspirin EC tablet 325 mg, 325 mg, Oral, Daily, Samtani, Jai-Gurmukh, MD .  cyclobenzaprine (FLEXERIL) tablet 10 mg, 10 mg, Oral, TID PRN, Nita Sells, MD .  Derrill Memo ON 07/12/2017] ezetimibe (ZETIA) tablet 10 mg, 10 mg, Oral, Daily, Samtani, Jai-Gurmukh, MD .  hydrALAZINE (APRESOLINE) injection 10 mg, 10 mg, Intravenous, Q4H PRN, Samtani, Jai-Gurmukh, MD .  hydrALAZINE (APRESOLINE) tablet 25 mg, 25 mg, Oral, TID, Samtani, Jai-Gurmukh, MD .  insulin glargine (LANTUS) injection 25 Units, 25 Units, Subcutaneous, Q2200, Samtani, Jai-Gurmukh, MD .  senna-docusate (Senokot-S) tablet 1 tablet, 1 tablet, Oral, QHS PRN, Nita Sells, MD  Patients Current Diet: Diet heart healthy/carb modified Room service appropriate? Yes; Fluid consistency: Thin Diet - low sodium heart healthy  Precautions / Restrictions Precautions Precautions: Fall Precaution Comments: up with assist Restrictions Weight Bearing Restrictions: No Other Position/Activity Restrictions: R sided weakness and neglect   Has the patient had 2 or more falls or a fall with injury in the past year?No  Prior Activity Level Community (5-7x/wk): Prior to admission paient was fully independent and driving.  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    Home Assistive Devices / Equipment Home Assistive Devices/Equipment: Eyeglasses  Prior Device Use: Indicate devices/aids used by the patient prior to current illness, exacerbation or injury? None of the above  Prior Functional Level Prior Function Level of Independence: Independent  Self Care: Did the patient need help bathing, dressing, using the toilet or eating? Independent  Indoor Mobility: Did the patient need assistance with walking from room to room (with or without device)?  Independent  Stairs: Did the patient need assistance with internal or external stairs (with or without device)? Independent  Functional Cognition: Did the patient need help planning regular tasks such as shopping or remembering to take medications? Independent  Current Functional Level Cognition  Arousal/Alertness: Awake/alert Overall Cognitive Status: Impaired/Different from baseline Difficult to assess due to: Impaired communication Orientation Level: Oriented X4 Following Commands: Follows multi-step commands inconsistently, Follows one step commands with increased time General Comments: Pt initially not able to state DOB, able to state his age with increased time; Upon exiting and returning to room Pt able to state his DOB with increased time/effort, with min assist for word finding when stating the year  Attention: Sustained Sustained Attention: Appears intact    Extremity Assessment (includes Sensation/Coordination)  Upper Extremity Assessment: RUE deficits/detail RUE Deficits / Details: mild decreased initiation and +/-3 out of 5 throughout.  family reports cueing pt to use extremity. daughter reports pt with R arm dangling from recliner earlier, cues to correct.  RUE Coordination: decreased fine motor  Lower Extremity Assessment: Defer to PT evaluation RLE Deficits / Details: mildly slowed initiation with some residual neglect similar to RUE but able to transition to stand and walk without buckling or tripping    ADLs  Overall ADL's : Needs assistance/impaired Eating/Feeding: Set up, Sitting Eating/Feeding Details (indicate cue type and reason): rest breaks likely needed for feeding with RUE. Daughter reports only minimal spillage (Jell-o) during lunch.  Grooming: Set up, Wash/dry face Grooming Details (indicate cue type and reason): setup assist provided as Pt with watering eyes; issued built up handle and educated Pt/[name] on it's uses to increase independence  during self-feeding and grooming tasks  Upper Body Bathing: Moderate assistance, Sitting Lower Body Bathing: Moderate assistance, Sit to/from stand Upper Body Dressing : Moderate assistance, Sitting Lower Body Dressing: Moderate assistance, Sit to/from stand Toilet Transfer: Minimal assistance, Ambulation, RW, BSC Toileting- Clothing Manipulation and Hygiene: Moderate assistance, Sit to/from stand Tub/ Shower Transfer: Moderate assistance Functional mobility during ADLs: Minimal assistance, Rolling walker General ADL Comments: Pt with increased BP this afternoon (212/103) therefore OOB activity deferred at this time. Pt having increasingly watering eyes, provided washcloth for bed level grooming; issued built up handle for ADL completion and education provided on its uses; Pt participated in [facility] activities with minA for completion;    Mobility  Overal bed mobility: Needs Assistance Bed Mobility: Rolling, Sidelying to Sit Rolling: Supervision Sidelying to sit: Supervision General bed mobility comments: increased time, definite use of bed rail, no physical assist.    Transfers  Overall transfer level: Needs assistance Equipment used: None Transfers: Sit to/from Stand Sit to Stand: Min assist General transfer comment: minA for safety    Ambulation / Gait / Stairs / Wheelchair Mobility  Ambulation/Gait Ambulation/Gait assistance: Museum/gallery curator (Feet): 250 Feet Assistive device: None(held onto gait belt) Gait Pattern/deviations: Step-through pattern, Drifts right/left General Gait Details: pt hovers to R side and consistantly near misses objects on the right side. Pt unable to navigate the hallways with simple R/L directions Gait velocity: slow Gait velocity interpretation: Below normal speed for age/gender Stairs: Yes Stairs assistance: Min assist Stair Management: One rail Right, Alternating pattern Number of Stairs: 3 General stair comments: pt with trip  up stairs due to inability to clear R foot requiring minA to maintain balance    Posture / Balance Balance Overall balance assessment: Needs assistance Sitting-balance support: Bilateral upper extremity supported Sitting balance-Leahy Scale: Good Standing balance support: No upper extremity supported, During functional activity Standing balance-Leahy Scale: Fair Standing balance comment: minA to maintain balance during  pertabations Standardized Balance Assessment Standardized Balance Assessment : Dynamic Gait Index Dynamic Gait Index Level Surface: Mild Impairment Change in Gait Speed: Mild Impairment Gait with Horizontal Head Turns: Mild Impairment Gait with Vertical Head Turns: Mild Impairment Gait and Pivot Turn: Mild Impairment Step Over Obstacle: Mild Impairment Step Around Obstacles: Moderate Impairment Steps: Moderate Impairment Total Score: 14    Special needs/care consideration BiPAP/CPAP: No CPM: No Continuous Drip IV: No Dialysis: No         Life Vest: No Oxygen: No Special Bed: No Trach Size: No Wound Vac (area): No       Skin: Spouse report areas of discoloration on feet and toe nails                                Bowel mgmt: None since PTA; last BM 07/07/17 Bladder mgmt: Continent  Diabetic mgmt: Yes     Previous Home Environment Living Arrangements: Spouse/significant other  Lives With: Spouse Available Help at Discharge: Family Type of Home: House Home Layout: Two level, Able to live on main level with bedroom/bathroom Home Access: Stairs to enter Technical brewer of Steps: 2 [place]: No  Discharge Living Setting Plans for Discharge Living Setting: Patient's home, Lives with (comment)(Spouse) Type of Home at Discharge: House Discharge Home Layout: Two level, Able to live on main level with bedroom/bathroom Alternate Level Stairs-Number of Steps: 1 flight  Discharge Home Access: Stairs to enter Entrance Stairs-Rails: Can reach  both Entrance Stairs-Number of Steps: 4, 1  Discharge Bathroom Shower/Tub: Tub/shower unit, Curtain Discharge Bathroom Toilet: Standard Discharge Bathroom Accessibility: Yes How Accessible: Accessible via walker Does the patient have any problems obtaining your medications?: No  Social/Family/Support Systems Patient Roles: Spouse, Parent Contact Information: Spouse: [name] Lallier  Anticipated Caregiver: Spouse as needed Anticipated Ambulance person Information: [phone] Ability/Limitations of Caregiver: Limited ability to physically assist  Caregiver Availability: 24/7 Discharge Plan Discussed with Primary Caregiver: Yes Is Caregiver In Agreement with Plan?: Yes Does Caregiver/Family have Issues with Lodging/Transportation while Pt is in Rehab?: No  Goals/Additional Needs Patient/Family Goal for Rehab: PT/OT: Mod I, SLP: Mod I-Min A Expected length of stay: 11-15 days  Cultural Considerations: Attends and is active in church community; minister is aware he is here and may visit  Dietary Needs: Heart healthty and carb mod diet restrictions  Equipment Needs: TBD Pt/Family Agrees to Admission and willing to participate: Yes Program Orientation Provided & Reviewed with Pt/Caregiver Including Roles  & Responsibilities: Yes  Decrease burden of Care through IP rehab admission: No  Possible need for SNF placement upon discharge: No  Patient Condition: This patient's condition remains as documented in the consult dated 07/10/17 at 11:42 am, in which the Rehabilitation Physician determined and documented that the patient's condition is appropriate for intensive rehabilitative care in an inpatient rehabilitation facility. Will admit to inpatient rehab today.  Preadmission Screen Completed By:  Gunnar Fusi, 07/11/2017 1:43 PM ______________________________________________________________________   Discussed status with Dr. Naaman Plummer on 07/11/17 at 1345 and received telephone  approval for admission today.  Admission Coordinator:  Gunnar Fusi, time 1345/Date 07/11/17             Cosigned by: Meredith Staggers, MD at 07/11/2017 2:26 PM  Revision History

## 2017-07-12 NOTE — Evaluation (Signed)
Occupational Therapy Assessment and Plan  Patient Details  Name: [name] [name] MRN: [id number] Date of Birth: 21-Apr-1946  OT Diagnosis: cognitive deficits, muscle weakness (generalized) and perceptual deficits Rehab Potential: Rehab Potential (ACUTE ONLY): Good ELOS: 3-5 days   Today's Date: 07/12/2017 OT Individual Time: 0900-1015 OT Individual Time Calculation (min): 75 min     Problem List:  Patient Active Problem List   Diagnosis Date Noted  . Acute ischemic left MCA stroke ([place]) 07/12/2017  . Diabetes mellitus type 2 in obese ([place])   . Stage 3 chronic kidney disease ([place])   . Benign essential HTN   . Left middle cerebral artery stroke ([place]) 07/09/2017  . Acute embolic stroke ([place])   . Aphasia 07/08/2017  . Gout 07/21/2011  . Prostatitis, chronic 07/14/2010  . RENAL INSUFFICIENCY 02/05/2008  . Hyperlipidemia 06/18/2007  . DM type 2 (diabetes mellitus, type 2) ([place]) 01/02/2007  . Hypertensive kidney disease 01/02/2007    Past Medical History:  Past Medical History:  Diagnosis Date  . DIABETES MELLITUS, TYPE II 01/02/2007  . HYPERLIPIDEMIA 06/18/2007  . HYPERTENSION 01/02/2007  . RENAL INSUFFICIENCY 02/05/2008   Past Surgical History:  Past Surgical History:  Procedure Laterality Date  . LOOP RECORDER INSERTION N/A 07/11/2017   Procedure: LOOP RECORDER INSERTION;  Surgeon: Thompson Grayer, MD;  Location: [facility];  Service: Cardiovascular;  Laterality: N/A;  . SHOULDER SURGERY     left  . TRANSURETHRAL RESECTION OF PROSTATE     history of retention/hematuria     Assessment & Plan Clinical Impression: [name] [name] is a 72 y.o. male with history of HTN, T2DM, who was admitted on 07/08/17 with difficulty speaking and mild right sided weakness. BP elevated in ED and CT head negative of for hemorrhage. CTA/Perfusion  brain showed lipid laden plaques extending almost 2 cm into L-ICA concerning for recurrent/distal emboli.  MRI brains showed multifocal areas of  restricted diffusion in L-MCA territory--larges in left frontal cortex and subcortical white matter felt to be embolic. 2D echo showed EF 55-60% with no wall abnormality. Carotid dopplers without significant ICA stenosis.    On ASA for secondary stroke prevention and Vascular consult recommended for L-ICA plaque.  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 Patient limited by right sided weakness with inattention, difficulty processing higher levels commands, apraxia and aphasia. Therapy evaluations completed yesterday and CIR recommended due to functional deficits.   Patient transferred to CIR on 07/11/2017 .    Patient currently requires min with basic self-care skills secondary to muscle weakness, decreased cardiorespiratoy endurance, decreased attention to right, decreased awareness, decreased problem solving, decreased memory and delayed processing and decreased standing balance and decreased balance strategies.  Prior to hospitalization, patient was fully independent, driving, volunteering.  Patient will benefit from skilled intervention to increase independence with basic self-care skills prior to discharge home with care partner.  Anticipate patient will require 24 hour supervision and follow up outpatient.  OT - End of Session Endurance Deficit: Yes Endurance Deficit Description: elevated BP OT Assessment Rehab Potential (ACUTE ONLY): Good OT Patient demonstrates impairments in the following area(s): Balance;Cognition;Perception;Safety;Endurance OT Basic ADL's Functional Problem(s): Bathing;Dressing;Toileting OT Advanced ADL's Functional Problem(s): Simple Meal Preparation;Light Housekeeping OT Transfers Functional Problem(s): Toilet;Tub/Shower OT Additional Impairment(s): None OT Plan OT Intensity: Minimum of 1-2 x/day, 45 to 90 minutes OT Frequency: 5 out of 7 days OT  Duration/Estimated Length of  Stay: 3-5 days OT Treatment/Interventions: Balance/vestibular training;Cognitive remediation/compensation;Discharge planning;DME/adaptive equipment instruction;Self Care/advanced ADL retraining;Therapeutic Activities;Therapeutic Exercise;Visual/perceptual remediation/compensation;Neuromuscular re-education;Patient/family education;Functional mobility training OT Self Feeding Anticipated Outcome(s): Independent OT Basic Self-Care Anticipated Outcome(s): mod I OT Toileting Anticipated Outcome(s): mod I OT Bathroom Transfers Anticipated Outcome(s): supervision to tub/ shower, mod I to toilet OT Recommendation Patient destination: Home Follow Up Recommendations: Outpatient OT Equipment Recommended: Tub/shower seat   Skilled Therapeutic Intervention Pt seen for initial evaluation and ADL training with a focus on R side awareness and balance.  Explained purpose of OT and overall POC.  Pt is slow to respond, but seems to understand a fair amount of the explanations.  He was able to answer some questions more effectively with questioning cues or a verbal cue on how the word begins. His blood pressure continues to be high so structured session in a slow pace.  Pt got out of bed with S, stood with steadying A with increased A for balance tasks of marching in place or forward toe taps.  He used a RW to ambulated to toilet and then to shower with steady A. When doffing gown he did not realize that his hand was not fully out of the gown.  In shower, demonstrated good body awareness and washed all areas without verbal cues.  Returned to room to dress. Donned hospital pants with min A to support balance as he pulled them over his hips.  Donned socks independently.  Pt stood at sink to groom.  Pt engaged in perceptual tasks of copying designs, clock draw and reading various sized fonts in newspaper.  When reading, pt is able to articulate all words.  In conversation he has word finding  difficulties.  Pt was able to track L and R in reading but did need a paper guide to avoid skipping lines.  Pt's PT arrived for his next session.    OT Evaluation Precautions/Restrictions  Precautions Precautions: Fall Restrictions Weight Bearing Restrictions: No    Vital Signs Therapy Vitals Pulse Rate: 84 BP: (!) 179/93 Pain Pain Assessment Pain Assessment: No/denies pain Home Living/Prior Functioning Home Living Family/patient expects to be discharged to:: Private residence Living Arrangements: Spouse/significant other Available Help at Discharge: Family Type of Home: House Home Access: Stairs to enter Technical brewer of Steps: 2 Home Layout: Two level, Able to live on main level with bedroom/bathroom Bathroom Shower/Tub: Tub/shower unit, Curtain  Lives With: Spouse Prior Function Level of Independence: Independent with basic ADLs, Independent with homemaking with ambulation, Independent with gait  Able to Take Stairs?: Yes Driving: Yes Vocation: Retired Comments: yard work, house work, cooking ADL ADL ADL Comments: refer to Environmental health practitioner Vision Baseline Vision/History: Wears glasses Wears Glasses: At all times Patient Visual Report: No change from baseline Vision Assessment?: Yes Eye Alignment: Within Functional Limits Ocular Range of Motion: Within Functional Limits Alignment/Gaze Preference: Within Defined Limits Tracking/Visual Pursuits: Decreased smoothness of eye movement to RIGHT superior field Convergence: (impaired via test, but pt is able to clearly read small print) Visual Fields: No apparent deficits Additional Comments: when reading small font in newspaper article, he would skip words or lines, when a piece of paper used as a guide, he was able to follow without difficulty Perception  Perception: Impaired Inattention/Neglect: Does not attend to right visual field;Does not attend to right side of body Spatial Orientation: in clock draw  test, had difficulty placing hour/ minute hands on clock Comments: mild inattention today. pt needed a cue to fully doff gown off R hand.  Praxis Praxis: Intact(functional for basic self care tasks) Cognition Overall Cognitive Status: Impaired/Different from baseline Arousal/Alertness: Awake/alert Orientation Level: Person;Place;Situation Person: Oriented Place: Oriented Situation: Oriented Year: 2019(with verbal cue of "two thousand...") Month: January Day of Week: Correct Immediate Memory Recall: Sock;Blue;Bed Memory Recall: Sock;Blue;Bed Memory Recall Sock: Without Cue Memory Recall Blue: With Cue Memory Recall Bed: Without Cue Sustained Attention: Appears intact Awareness: Impaired Awareness Impairment: Emergent impairment Problem Solving: Impaired Problem Solving Impairment: Functional complex Safety/Judgment: Impaired Sensation Sensation Light Touch: Appears Intact Stereognosis: Impaired by gross assessment Hot/Cold: Appears Intact Proprioception: Impaired Detail Proprioception Impaired Details: Impaired RLE;Impaired RUE Coordination Gross Motor Movements are Fluid and Coordinated: Yes Fine Motor Movements are Fluid and Coordinated: Yes Motor  Motor Motor - Skilled Clinical Observations: generalized weakness Mobility    refer to functional navigator Trunk/Postural Assessment  Cervical Assessment Cervical Assessment: Within Functional Limits Thoracic Assessment Thoracic Assessment: Within Functional Limits Lumbar Assessment Lumbar Assessment: Within Functional Limits Postural Control Postural Control: Deficits on evaluation Righting Reactions: delayed  Balance Standardized Balance Assessment Standardized Balance Assessment: Berg Balance Test Berg Balance Test Sit to Stand: Able to stand  independently using hands Standing Unsupported: Able to stand safely 2 minutes Sitting with Back Unsupported but Feet Supported on Floor or Stool: Able to sit safely and  securely 2 minutes Stand to Sit: Sits safely with minimal use of hands Transfers: Able to transfer safely, minor use of hands Standing Unsupported with Eyes Closed: Able to stand 10 seconds with supervision Standing Ubsupported with Feet Together: Able to place feet together independently and stand for 1 minute with supervision From Standing, Reach Forward with Outstretched Arm: Reaches forward but needs supervision From Standing Position, Pick up Object from Floor: Able to pick up shoe, needs supervision From Standing Position, Turn to Look Behind Over each Shoulder: Turn sideways only but maintains balance Turn 360 Degrees: Able to turn 360 degrees safely one side only in 4 seconds or less Standing Unsupported, Alternately Place Feet on Step/Stool: Able to complete >2 steps/needs minimal assist Standing Unsupported, One Foot in Front: Able to take small step independently and hold 30 seconds Standing on One Leg: Unable to try or needs assist to prevent fall Total Score: 37 Dynamic Sitting Balance Dynamic Sitting - Level of Assistance: 5: Stand by assistance Static Standing Balance Static Standing - Level of Assistance: 5: Stand by assistance Dynamic Standing Balance Dynamic Standing - Level of Assistance: 4: Min assist Extremity/Trunk Assessment RUE Assessment RUE Assessment: Within Functional Limits LUE Assessment LUE Assessment: Within Functional Limits   See Function Navigator for Current Functional Status.   Refer to Care Plan for Long Term Goals  Recommendations for other services: None    Discharge Criteria: Patient will be discharged from OT if patient refuses treatment 3 consecutive times without medical reason, if treatment goals not met, if there is a change in medical status, if patient makes no progress towards goals or if patient is discharged from hospital.  The above assessment, treatment plan, treatment alternatives and goals were discussed and mutually agreed  upon: by patient  [facility] 07/12/2017, 12:41 PM

## 2017-07-12 NOTE — Evaluation (Signed)
Speech Language Pathology Assessment and Plan  Patient Details  Name: [name] [name] MRN: [id number] Date of Birth: [birth year]  SLP Diagnosis: Aphasia;Cognitive Impairments  Rehab Potential: Excellent ELOS: 5-7 days     Today's Date: 07/12/2017 SLP Individual Time: 3009-2330 SLP Individual Time Calculation (min): 60 min   Problem List:  Patient Active Problem List   Diagnosis Date Noted  . Acute ischemic left MCA stroke ([place]) 07/12/2017  . Diabetes mellitus type 2 in obese ([place])   . Stage 3 chronic kidney disease ([place])   . Benign essential HTN   . Left middle cerebral artery stroke ([place]) 07/09/2017  . Acute embolic stroke ([place])   . Aphasia 07/08/2017  . Gout 07/21/2011  . Prostatitis, chronic 07/14/2010  . RENAL INSUFFICIENCY 02/05/2008  . Hyperlipidemia 06/18/2007  . DM type 2 (diabetes mellitus, type 2) ([place]) 01/02/2007  . Hypertensive kidney disease 01/02/2007   Past Medical History:  Past Medical History:  Diagnosis Date  . DIABETES MELLITUS, TYPE II 01/02/2007  . HYPERLIPIDEMIA 06/18/2007  . HYPERTENSION 01/02/2007  . RENAL INSUFFICIENCY 02/05/2008   Past Surgical History:  Past Surgical History:  Procedure Laterality Date  . LOOP RECORDER INSERTION N/A 07/11/2017   Procedure: LOOP RECORDER INSERTION;  Surgeon: Thompson Grayer, MD;  Location: [facility];  Service: Cardiovascular;  Laterality: N/A;  . SHOULDER SURGERY     left  . TRANSURETHRAL RESECTION OF PROSTATE     history of retention/hematuria     Assessment / Plan / Recommendation Clinical Impression Patient is a 72 y.o. male with history of HTN, T2DM, who was admitted on 07/08/17 with difficulty speaking and mild right sided weakness. BP elevated in ED and CT head negative of for hemorrhage. CTA/Perfusion  brain showed lipid laden plaques extending almost 2 cm into L-ICA concerning for recurrent/distal emboli.  MRI brains showed multifocal areas of restricted diffusion in L-MCA territory--larges in left  frontal cortex and subcortical white matter felt to be embolic. 2D echo showed EF 55-60% with no wall abnormality. Carotid dopplers without significant ICA stenosis. On ASA for secondary stroke prevention and Vascular consult recommended for L-ICA plaque.  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 Patient limited by right sided weakness with inattention, difficulty processing higher levels commands, apraxia and aphasia. Therapy evaluations completed yesterday and CIR recommended due to functional deficits. Patient admitted 07/11/17.    Patient demonstrates a moderate non-fluent aphasia with biggest functional impact on verbal expression. Patient demonstrates impaired word-finding and minimal spontaneous verbal output. Receptive abilities appear [facility] for basic information with mild breakdown at the complex level. Reading comprehension and written expression appear grossly WFL. Mild deficits in attention to right field of environment and safety awareness noted. Patient would benefit from skilled SLP intervention to maximize his cognitive-linguistic function and overall functional independence prior to discharge.    Skilled Therapeutic Interventions          Administered a cognitive-linguistic evaluation. Please see above for details. Educated patient current cognitive-linguistic impairments and goals of skilled SLP intervention. He verbalized understanding and agreement.   SLP Assessment  Patient will need skilled [place] [facility] during CIR admission    Recommendations  Oral Care Recommendations: Oral care BID Recommendations for Other Services: Neuropsych consult Patient destination: Home Follow up Recommendations: 24 hour supervision/assistance;Outpatient SLP Equipment Recommended: None recommended by SLP    SLP Frequency 3 to 5 out of 7  days   SLP Duration  SLP  Intensity  SLP Treatment/Interventions 5-7 days   Minumum of 1-2 x/day, 30 to 90 minutes  Cognitive remediation/compensation;Environmental controls;Internal/external aids;Cueing hierarchy;Dysphagia/aspiration precaution training;Functional tasks;Patient/family education;Therapeutic Activities    Pain Pain Assessment Pain Assessment: No/denies pain  Prior Functioning Type of Home: House  Lives With: Spouse Available Help at Discharge: Family Vocation: Retired  Function:   Cognition Comprehension Comprehension assist level: Understands basic 75 - 89% of the time/ requires cueing 10 - 24% of the time  Expression   Expression assist level: Expresses basic 50 - 74% of the time/requires cueing 25 - 49% of the time. Needs to repeat parts of sentences.  Social Interaction Social Interaction assist level: Interacts appropriately 75 - 89% of the time - Needs redirection for appropriate language or to initiate interaction.  Problem Solving Problem solving assist level: Solves basic 75 - 89% of the time/requires cueing 10 - 24% of the time  Memory Memory assist level: Recognizes or recalls 90% of the time/requires cueing < 10% of the time   Short Term Goals: Week 1: SLP Short Term Goal 1 (Week 1): STGs=LTGs due to short length of stay   Refer to Care Plan for Long Term Goals  Recommendations for other services: Neuropsych  Discharge Criteria: Patient will be discharged from SLP if patient refuses treatment 3 consecutive times without medical reason, if treatment goals not met, if there is a change in medical status, if patient makes no progress towards goals or if patient is discharged from hospital.  The above assessment, treatment plan, treatment alternatives and goals were discussed and mutually agreed upon: by patient  [name] 07/12/2017, 11:22 AM

## 2017-07-12 NOTE — Progress Notes (Signed)
BP high with activity--will resume Lotensin at 20 mg bid ( home dose 40 mg bid) and titrate to home dose.

## 2017-07-12 NOTE — Evaluation (Signed)
Physical Therapy Assessment and Plan  Patient Details  Name: [name] [name] MRN: [id number] Date of Birth: 1946/06/27  PT Diagnosis: Cognitive deficits, Difficulty walking and Muscle weakness Rehab Potential: Good ELOS: 5-7 days   Today's Date: 07/12/2017 PT Individual Time: 1015-1110 PT Individual Time Calculation (min): 55 min    Problem List:  Patient Active Problem List   Diagnosis Date Noted  . Acute ischemic left MCA stroke ([place]) 07/12/2017  . Diabetes mellitus type 2 in obese ([place])   . Stage 3 chronic kidney disease ([place])   . Benign essential HTN   . Left middle cerebral artery stroke ([place]) 07/09/2017  . Acute embolic stroke ([place])   . Aphasia 07/08/2017  . Gout 07/21/2011  . Prostatitis, chronic 07/14/2010  . RENAL INSUFFICIENCY 02/05/2008  . Hyperlipidemia 06/18/2007  . DM type 2 (diabetes mellitus, type 2) ([place]) 01/02/2007  . Hypertensive kidney disease 01/02/2007    Past Medical History:  Past Medical History:  Diagnosis Date  . DIABETES MELLITUS, TYPE II 01/02/2007  . HYPERLIPIDEMIA 06/18/2007  . HYPERTENSION 01/02/2007  . RENAL INSUFFICIENCY 02/05/2008   Past Surgical History:  Past Surgical History:  Procedure Laterality Date  . LOOP RECORDER INSERTION N/A 07/11/2017   Procedure: LOOP RECORDER INSERTION;  Surgeon: Thompson Grayer, MD;  Location: [facility];  Service: Cardiovascular;  Laterality: N/A;  . SHOULDER SURGERY     left  . TRANSURETHRAL RESECTION OF PROSTATE     history of retention/hematuria     Assessment & Plan Clinical Impression: Patient is a 72 y.o. year old male with recent admission to the hospital on 01/05/19with difficulty speaking and mild right sided weakness. BP elevated in ED and CT head negative of for hemorrhage. CTA/Perfusion brain showed lipid laden plaques extending almost 2 cm into L-ICA concerning for recurrent/distal emboli. MRI brains showed multifocal areas of restricted diffusion in L-MCA territory--larges in left  frontal cortex and subcortical white matter felt to be embolic.   Marland Kitchen  Patient transferred to CIR on 07/11/2017 .   Patient currently requires min with mobility secondary to muscle weakness, decreased cardiorespiratoy endurance and decreased standing balance and decreased balance strategies.  Prior to hospitalization, patient was independent  with mobility and lived with Spouse in a House home.  Home access is 2Stairs to enter.  Patient will benefit from skilled PT intervention to maximize safe functional mobility, minimize fall risk and decrease caregiver burden for planned discharge home with intermittent assist.  Anticipate patient will benefit from follow up OP at discharge.  PT - End of Session Activity Tolerance: Tolerates < 10 min activity with changes in vital signs Endurance Deficit: Yes Endurance Deficit Description: elevated BP PT Assessment Rehab Potential (ACUTE/IP ONLY): Good PT Patient demonstrates impairments in the following area(s): Balance;Endurance;Motor PT Transfers Functional Problem(s): Bed Mobility;Bed to Chair;Car;Floor;Furniture PT Locomotion Functional Problem(s): Ambulation;Stairs PT Plan PT Intensity: Minimum of 1-2 x/day ,45 to 90 minutes PT Frequency: 5 out of 7 days PT Duration Estimated Length of Stay: 5-7 days PT Treatment/Interventions: Ambulation/gait training;Community reintegration;DME/adaptive equipment instruction;Neuromuscular re-education;Stair training;UE/LE Strength taining/ROM;Wheelchair propulsion/positioning;UE/LE Coordination activities;Therapeutic Activities;Pain management;Discharge planning;Balance/vestibular training;Cognitive remediation/compensation;Functional mobility training;Patient/family education;Splinting/orthotics;Therapeutic Exercise PT Transfers Anticipated Outcome(s): mod I PT Locomotion Anticipated Outcome(s): mod I PT Recommendation Follow Up Recommendations: Outpatient PT Patient destination: Home Equipment Recommended: None  recommended by PT  Skilled Therapeutic Intervention Pt participated in skilled PT eval and was educated on PT goals and POC.  Pt's BP was elevated 190/102, RN aware and PA advised to "take it easy"  and not to attempt stairs on eval.  Pt performs gait throughout unit with steadying assist, wide BOS.  Gait with obstacle negotiation with pt "grazing" cones on Rt with his foot, pt able to correct with min cuing.  Furniture transfers with supervision.  Simulated car transfer to SUV height car with supervision, cues for safety.  Pt performed Berg balance test and scored 37/56.  Pt educated on risk for falls and verbalizes understanding. Pt left in room with needs at hand.  PT Evaluation Precautions/Restrictions Precautions Precautions: Fall Restrictions Weight Bearing Restrictions: No General BP 190/102 at beginning of session. RN made aware.  PA recommends "taking it easy" and not performing stairs Pain Pain Assessment Pain Assessment: No/denies pain Home Living/Prior Functioning Home Living Available Help at Discharge: Family Type of Home: House Home Access: Stairs to enter [employer] of Steps: 2 Home Layout: Two level;Able to live on main level with bedroom/bathroom Bathroom Shower/Tub: Tub/shower unit;Curtain  Lives With: Spouse Prior Function Level of Independence: Independent with basic ADLs;Independent with homemaking with ambulation;Independent with gait  Able to Take Stairs?: Yes Driving: Yes Vocation: Retired Comments: yard work, house work, TEFL teacher Overall Cognitive Status: Impaired/Different from baseline Arousal/Alertness: Awake/alert Orientation Level: Oriented X4 Sustained Attention: Appears intact Awareness: Impaired Awareness Impairment: Emergent impairment Problem Solving: Impaired Problem Solving Impairment: Functional complex Safety/Judgment: Impaired Sensation Sensation Light Touch: Appears Intact Proprioception: Impaired  Detail Proprioception Impaired Details: Impaired RLE;Impaired RUE Coordination Gross Motor Movements are Fluid and Coordinated: Yes Motor  Motor Motor - Skilled Clinical Observations: generalized weakness   Trunk/Postural Assessment  Cervical Assessment Cervical Assessment: Within Functional Limits Thoracic Assessment Thoracic Assessment: Within Functional Limits Lumbar Assessment Lumbar Assessment: Within Functional Limits Postural Control Postural Control: Deficits on evaluation Righting Reactions: delayed  Balance Standardized Balance Assessment Standardized Balance Assessment: Berg Balance Test Berg Balance Test Sit to Stand: Able to stand  independently using hands Standing Unsupported: Able to stand safely 2 minutes Sitting with Back Unsupported but Feet Supported on Floor or Stool: Able to sit safely and securely 2 minutes Stand to Sit: Sits safely with minimal use of hands Transfers: Able to transfer safely, minor use of hands Standing Unsupported with Eyes Closed: Able to stand 10 seconds with supervision Standing Ubsupported with Feet Together: Able to place feet together independently and stand for 1 minute with supervision From Standing, Reach Forward with Outstretched Arm: Reaches forward but needs supervision From Standing Position, Pick up Object from Floor: Able to pick up shoe, needs supervision From Standing Position, Turn to Look Behind Over each Shoulder: Turn sideways only but maintains balance Turn 360 Degrees: Able to turn 360 degrees safely one side only in 4 seconds or less Standing Unsupported, Alternately Place Feet on Step/Stool: Able to complete >2 steps/needs minimal assist Standing Unsupported, One Foot in Front: Able to take small step independently and hold 30 seconds Standing on One Leg: Unable to try or needs assist to prevent fall Total Score: 37 Extremity Assessment  RUE Assessment RUE Assessment: Within Functional Limits LUE  Assessment LUE Assessment: Within Functional Limits RLE Assessment RLE Assessment: (grossly 3/5) LLE Assessment LLE Assessment: (grossly 3/5)   See Function Navigator for Current Functional Status.   Refer to Care Plan for Long Term Goals  Recommendations for other services: None   Discharge Criteria: Patient will be discharged from PT if patient refuses treatment 3 consecutive times without medical reason, if treatment goals not met, if there is a change in medical status, if patient makes no  progress towards goals or if patient is discharged from hospital.  The above assessment, treatment plan, treatment alternatives and goals were discussed and mutually agreed upon: by patient  [facility] 07/12/2017, 11:22 AM

## 2017-07-12 NOTE — Progress Notes (Signed)
[name] [name] PHYSICAL MEDICINE & REHABILITATION     PROGRESS NOTE  Subjective/Complaints:  Pt seen lying in bed this AM.  He slept well overnight.  Wife at bedside with questions.  He is ready to begin his day of therapies.  ROS: Denies CP, SOB, N/V/D.  Objective: Vital Signs: Blood pressure (!) 174/84, pulse 77, temperature 98.9 F (37.2 C), temperature source Oral, resp. rate 18, height 6\' 3"  (1.905 m), weight 111 kg (244 lb 11.4 oz), SpO2 99 %. No results found. No results for input(s): WBC, HGB, HCT, PLT in the last 72 hours. No results for input(s): NA, K, CL, GLUCOSE, BUN, CREATININE, CALCIUM in the last 72 hours.  Invalid input(s): CO CBG (last 3)  Recent Labs    07/11/17 0623 07/11/17 2126 07/12/17 0621  GLUCAP 96 187* 118*    Wt Readings from Last 3 Encounters:  07/12/17 111 kg (244 lb 11.4 oz)  07/08/17 111.1 kg (244 lb 14.9 oz)  06/19/17 112 kg (247 lb)    Physical Exam:  BP (!) 174/84 (BP Location: Right Arm)   Pulse 77   Temp 98.9 F (37.2 C) (Oral)   Resp 18   Ht 6\' 3"  (1.905 m)   Wt 111 kg (244 lb 11.4 oz)   SpO2 99%   BMI 30.59 kg/m  Constitutional: He appearswell-developedand well-nourished.  HENT: Normocephalicand atraumatic.  Eyes:EOMare normal. No discharge.  Cardiovascular:RRR. No JVD. Respiratory:Effort normaland breath sounds normal.  UJ:WJXBJ sounds are normal. There isno tenderness.  Musculoskeletal: He exhibits noedema, no tenderness.  Neurological: He isalertand oriented x3 with cues and additional time. Dysarthria.  Followed all one step commands with delay.  Sensation grossly intact to light touch Motor: RUE: 4+/5 prox to distal.  RLE: 4+/5 proximal to distal LUE/LLE: 5/5 proximal to distal.  Skin: Skin iswarmand dry.  Psychiatric: His affect isblunt. He isslowed. Cognition and memory areimpaired.Flat.  Assessment/Plan: 1. Functional deficits secondary to left MCA infarct which require 3+ hours per day of  interdisciplinary therapy in a comprehensive inpatient rehab setting. Physiatrist is providing close team supervision and 24 hour management of active medical problems listed below. Physiatrist and rehab team continue to assess barriers to discharge/monitor patient progress toward functional and medical goals.  Function:  Bathing Bathing position      Bathing parts      Bathing assist        Upper Body Dressing/Undressing Upper body dressing                    Upper body assist        Lower Body Dressing/Undressing Lower body dressing                                  Lower body assist        Toileting Toileting          Toileting assist     Transfers Chair/bed transfer             Locomotion Ambulation           Wheelchair          Cognition Comprehension    Expression    Social Interaction    Problem Solving    Memory      Medical Problem List and Plan: 1.Right hemparesis and aphasiasecondary to left MCA infarct   Begin CIR   Notes reviewed, images reviewed 2. DVT Prophylaxis/Anticoagulation: Pharmaceutical:Lovenox  3. Pain Management:N/A 4. Mood:LCSW to follow for evaluation and support. 5. Neuropsych: This patientis not fullycapable of making decisions on hisown behalf. 6. Skin/Wound Care:Pressure relief measures. Maintain adequate hydration and nutritional status. 7. Fluids/Electrolytes/Nutrition:Monitor I/O.    Labs pending 8. HTN: Monitor BP bid. Used Lotensin bid, Norvasc, lasix and hydralazine tid at home.    Continue low dose hydralazine for now.    Hypertensive crisis overnight, monitor with increased mobility 9. CKD stage III: Followed by Dr. Jimmy Footman.Monitor lytes serially. Resume calcitriol.   Cr 2.21 on 1/6   Cont to monitor 10. T2DM: Hgb A1C-8.1. Monitor BS ac/hs. Continue lantus    Monitor with increased mobility 11. Dyslipidemia: Unable to tolerate statins. Now on Zetia.  12. Left  ICA plaque: Follow up with vascular after discharge. 13. Obesity   Body mass index is 30.59 kg/m.  Diet and exercise education   Encourage weight loss to increase endurance and promote overall health    LOS (Days) 1 A FACE TO FACE EVALUATION WAS PERFORMED  [name] [name] 07/12/2017 8:47 AM

## 2017-07-12 NOTE — Progress Notes (Signed)
Patient information reviewed and entered into eRehab system by Aloysious Vangieson, RN, CRRN, PPS Coordinator.  Information including medical coding and functional independence measure will be reviewed and updated through discharge.     Per nursing patient was given "Data Collection Information Summary for Patients in Inpatient Rehabilitation Facilities with attached "Privacy Act Statement-Health Care Records" upon admission.  

## 2017-07-12 NOTE — Progress Notes (Signed)
Physical Medicine and Rehabilitation Consult   Reason for Consult: Functional deficits due to stroke Referring Physician: Dr. Verlon Au   HPI: [name] [name] is a 72 y.o. male with history of HTN, T2DM, who was admitted on 07/08/16 with difficulty speaking and mild right sided weakness. BP elevated in ED and CT head negative of for hemorrhage. CTA/Perfusion  brain showed lipid laden plaques extending almost 2 cm into L-ICA concerning for recurrent/distal emboli.  MRI brains showed multifocal areas of restricted diffusion in L-MCA territory--larges in left frontal cortex and subcortical white matter felt to be embolic. 2D echo showed EF 55-60% with no wall abnormality. Carotid dopplers without significant ICA stenosis.   On ASA for secondary stroke prevention and Vascular to be consulted for input on ICA plaque. Speech therapy evaluation revealed expressive > receptive aphasia probably due to verbal apraxia. Patient limited by right sided weakness with inattention, difficulty processing higher levels commands, apraxia and aphasia. Therapy evaluations completed yesterday and CIR recommended due to functional deficits.    Review of Systems  Constitutional: Negative for chills and fever.  HENT: Negative for hearing loss and tinnitus.   Eyes: Negative for blurred vision and double vision.  Respiratory: Negative for cough and hemoptysis.   Cardiovascular: Negative for chest pain and palpitations.  Gastrointestinal: Positive for constipation. Negative for heartburn and vomiting.  Genitourinary: Negative for dysuria.  Musculoskeletal: Negative for myalgias and neck pain.  Skin: Negative for rash.  Neurological: Positive for speech change and focal weakness. Negative for dizziness, weakness and headaches.  Psychiatric/Behavioral: Positive for memory loss.          Past Medical History:  Diagnosis Date  . DIABETES MELLITUS, TYPE II 01/02/2007  . HYPERLIPIDEMIA 06/18/2007  . HYPERTENSION  01/02/2007  . RENAL INSUFFICIENCY 02/05/2008         Past Surgical History:  Procedure Laterality Date  . SHOULDER SURGERY     left        Family History  Problem Relation Age of Onset  . Hypertension Mother   . Diabetes Mother   . Stroke Father   . Stroke Maternal Grandmother     Social History:  Married. Retired Agricultural consultant. Wife at home and can provide supervision after discharge. He reports that  has never smoked. he has never used smokeless tobacco. He drinks alcohol on rare occasions or wine. he reports that he does use drugs.         Allergies  Allergen Reactions  . Bactrim [Sulfamethoxazole-Trimethoprim] Hives, Itching and Swelling  . Penicillins     Has patient had a PCN reaction causing immediate rash, facial/tongue/throat swelling, SOB or lightheadedness with hypotension: Unk Has patient had a PCN reaction causing severe rash involving mucus membranes or skin necrosis: Unk Has patient had a PCN reaction that required hospitalization: Unk Has patient had a PCN reaction occurring within the last 10 years: No If all of the above answers are "NO", then may proceed with Cephalosporin use.  . Statins     myalgia  . Sulfa Drugs Cross Reactors Hives, Itching and Swelling          Medications Prior to Admission  Medication Sig Dispense Refill  . amLODipine (NORVASC) 10 MG tablet Take 1 tablet (10 mg total) by mouth daily. 30 tablet 0  . benazepril (LOTENSIN) 40 MG tablet Take 1 tablet (40 mg total) by mouth 2 (two) times daily. 60 tablet 0  . furosemide (LASIX) 80 MG tablet 160 mg po q a.m and 80 mg  po q afternoon 270 tablet 3  . hydrALAZINE (APRESOLINE) 25 MG tablet Take 1 tablet (25 mg total) by mouth 3 (three) times daily. 90 tablet 0  . insulin aspart (NOVOLOG) 100 UNIT/ML injection Take 6 units with every meal unless blood sugar above 200 than take 10 units (Patient taking differently: Inject 12 Units into the skin 3 (three) times daily with meals. ) 3  vial 11  . Insulin Glargine (LANTUS SOLOSTAR) 100 UNIT/ML Solostar Pen INJECT 20 UNITS SUBCUTANEOUSLY ONCE DAILY. NEED TO MAKE APPOINTMENT. (Patient taking differently: Inject 25 Units into the skin daily at 10 pm. ) 2 pen 0  . labetalol (NORMODYNE) 200 MG tablet Take 1 tablet (200 mg total) by mouth 2 (two) times daily. 180 tablet 0  . calcitRIOL (ROCALTROL) 0.25 MCG capsule Take 1 capsule by mouth daily.    . cyclobenzaprine (FLEXERIL) 10 MG tablet Take 1 tablet (10 mg total) by mouth 3 (three) times daily as needed for muscle spasms. 30 tablet 0  . fluvastatin (LESCOL) 40 MG capsule Take 1 capsule (40 mg total) by mouth at bedtime. (Patient not taking: Reported on 07/08/2017) 30 capsule 11  . glucose blood (ONETOUCH VERIO) test strip 1 each by Other route daily. Use as instructed (Patient not taking: Reported on 07/08/2017) 100 each 11  . Insulin Pen Needle (B-D UF III MINI PEN NEEDLES) 31G X 5 MM MISC USE AS DIRECTED (Patient not taking: Reported on 07/08/2017) 100 each 11  . nateglinide (STARLIX) 120 MG tablet Take 1 tablet (120 mg total) by mouth 3 (three) times daily with meals. (Patient not taking: Reported on 07/08/2017) 90 tablet 6  . potassium chloride SA (K-DUR,KLOR-CON) 20 MEQ tablet Take 1 tablet by mouth daily.      Home: Home Living Family/patient expects to be discharged to:: Private residence Living Arrangements: Spouse/significant other Available Help at Discharge: Family Type of Home: House Home Access: Stairs to enter Technical brewer of Steps: 2 [place]: Two level, Able to live on main level with bedroom/bathroom  Lives With: Spouse  Functional History: Prior Function Level of Independence: Independent Functional Status:  Mobility: Bed Mobility Overal bed mobility: Modified Independent General bed mobility comments: increased time to EOB Transfers Overall transfer level: Needs assistance Equipment used: Rolling walker (2 wheeled) Transfers: Sit to/from  Stand Sit to Stand: Min assist General transfer comment: min A to powerup and steady from EOB position.  Ambulation/Gait Ambulation/Gait assistance: Min guard Ambulation Distance (Feet): 125 Feet Assistive device: 1 person hand held assist Gait Pattern/deviations: Step-through pattern, Trunk flexed General Gait Details: slowed gait, low clearance but intact heel-toe pattern; cues to accentuate heel strike Right to emphasize recovery of gait mechanics Gait velocity interpretation: Below normal speed for age/gender  ADL: ADL Overall ADL's : Needs assistance/impaired Eating/Feeding: Set up, Sitting Eating/Feeding Details (indicate cue type and reason): rest breaks likely needed for feeding with RUE. Daughter reports only minimal spillage (Jell-o) during lunch.  Grooming: Minimal assistance, Sitting Grooming Details (indicate cue type and reason): extra time and effor. Min A for overhead grooming tasks Upper Body Bathing: Moderate assistance, Sitting Lower Body Bathing: Moderate assistance, Sit to/from stand Upper Body Dressing : Moderate assistance, Sitting Lower Body Dressing: Moderate assistance, Sit to/from stand Toilet Transfer: Minimal assistance, Ambulation, RW, BSC Toileting- Clothing Manipulation and Hygiene: Moderate assistance, Sit to/from stand Tub/ Shower Transfer: Moderate assistance Functional mobility during ADLs: Minimal assistance, Rolling walker General ADL Comments: Pt completed bed mobility and in-room functional mobility. Oral care standing at  sink with items on right side. Some decreased initation. Reaching for blood pressure cuff in bag while looking for towel to dry off hands.   Cognition: Cognition Overall Cognitive Status: Difficult to assess Arousal/Alertness: Awake/alert Orientation Level: Oriented to person, Oriented to place, Oriented to situation, Disoriented to time Attention: Sustained Sustained Attention: Appears intact Cognition Arousal/Alertness:  Awake/alert Behavior During Therapy: Flat affect, WFL for tasks assessed/performed Overall Cognitive Status: Difficult to assess General Comments: stays engaged in conversation, offers input, answers questions appropriately Difficult to assess due to: Impaired communication  Blood pressure (!) 184/87, pulse 72, temperature (!) 89.7 F (32.1 C), temperature source Oral, resp. rate 20, height 6\' 3"  (1.905 m), weight 111.1 kg (244 lb 14.9 oz), SpO2 99 %. Physical Exam  Nursing note and vitals reviewed. Constitutional: He is oriented to person, place, and time. He appears well-developed and well-nourished. No distress.  HENT:  Head: Normocephalic and atraumatic.  Mouth/Throat: Oropharynx is clear and moist.  Eyes: Conjunctivae and EOM are normal. Pupils are equal, round, and reactive to light.  Neck: Normal range of motion. Neck supple.  Cardiovascular: Normal rate and regular rhythm.  Respiratory: Effort normal and breath sounds normal. No stridor. No respiratory distress.  GI: Soft. Bowel sounds are normal. He exhibits no distension. There is no tenderness.  Musculoskeletal: He exhibits no edema or tenderness.  Neurological: He is alert and oriented to person, place, and time.  Flat affect. Mild right inattention with right hemiparesis. He had difficulty with two step commands. His verbal output limited to one to two words. He was able to answer most basic orientation questions accurately. He has decreased awareness of deficits. Right hemiparesis 4- out of 5 right upper extremity and 4 out of 5 right lower extremity.  Patient with motor apraxia  Skin: Skin is warm and dry. He is not diaphoretic.  Psychiatric: His affect is blunt. His speech is delayed. He is slowed. Cognition and memory are impaired. He expresses inappropriate judgment.  Assessment/Plan: Diagnosis: Left middle cerebral artery infarct with right hemiparesis and aphasia 1. Does the need for close, 24 hr/day medical supervision  in concert with the patient's rehab needs make it unreasonable for this patient to be served in a less intensive setting? Yes 2. Co-Morbidities requiring supervision/potential complications: Diabetes type 2, hypertension, gout 3. Due to bladder management, bowel management, safety, skin/wound care, disease management, medication administration, pain management and patient education, does the patient require 24 hr/day rehab nursing? Yes 4. Does the patient require coordinated care of a physician, rehab nurse, PT (1-2 hrs/day, 5 days/week), OT (1-2 hrs/day, 5 days/week) and SLP (1-2 hrs/day, 5 days/week) to address physical and functional deficits in the context of the above medical diagnosis(es)? Yes Addressing deficits in the following areas: balance, endurance, locomotion, strength, transferring, bowel/bladder control, bathing, dressing, feeding, grooming, toileting, cognition, speech, language and psychosocial support 5. Can the patient actively participate in an intensive therapy program of at least 3 hrs of therapy per day at least 5 days per week? Yes 6. The potential for patient to make measurable gains while on inpatient rehab is excellent 7. Anticipated functional outcomes upon discharge from inpatient rehab are modified independent  with PT, modified independent with OT, modified independent, supervision and min assist with SLP. 8. Estimated rehab length of stay to reach the above functional goals is: 11-15 days 9. Anticipated D/C setting: Home 10. Anticipated post D/C treatments: HH therapy and Outpatient therapy 11. Overall Rehab/Functional Prognosis: excellent  RECOMMENDATIONS: This patient's condition is  appropriate for continued rehabilitative care in the following setting: CIR Patient has agreed to participate in recommended program. Yes Note that insurance prior authorization may be required for reimbursement for recommended care.  Comment: Rehab Admissions Coordinator to follow  up.  Thanks,  Meredith Staggers, MD, Mellody Drown    Bary Leriche, PA-C 07/10/2017          Revision History                             Routing History

## 2017-07-13 ENCOUNTER — Encounter (HOSPITAL_COMMUNITY): Payer: Self-pay | Admitting: Cardiology

## 2017-07-13 ENCOUNTER — Inpatient Hospital Stay (HOSPITAL_COMMUNITY): Payer: Medicare Other

## 2017-07-13 ENCOUNTER — Inpatient Hospital Stay (HOSPITAL_COMMUNITY): Payer: Medicare Other | Admitting: Occupational Therapy

## 2017-07-13 ENCOUNTER — Inpatient Hospital Stay (HOSPITAL_COMMUNITY): Payer: Medicare Other | Admitting: Speech Pathology

## 2017-07-13 DIAGNOSIS — D62 Acute posthemorrhagic anemia: Secondary | ICD-10-CM

## 2017-07-13 DIAGNOSIS — E876 Hypokalemia: Secondary | ICD-10-CM

## 2017-07-13 DIAGNOSIS — I169 Hypertensive crisis, unspecified: Secondary | ICD-10-CM

## 2017-07-13 LAB — GLUCOSE, CAPILLARY
GLUCOSE-CAPILLARY: 131 mg/dL — AB (ref 65–99)
Glucose-Capillary: 98 mg/dL (ref 65–99)
Glucose-Capillary: 67 mg/dL (ref 65–99)
Glucose-Capillary: 98 mg/dL (ref 65–99)
Glucose-Capillary: 109 mg/dL — ABNORMAL HIGH (ref 65–99)

## 2017-07-13 MED ORDER — BENAZEPRIL HCL 40 MG PO TABS
40.0000 mg | ORAL_TABLET | Freq: Two times a day (BID) | ORAL | Status: DC
  Filled 2017-07-13: qty 1

## 2017-07-13 MED ORDER — HYDRALAZINE HCL 50 MG PO TABS
50.0000 mg | ORAL_TABLET | Freq: Three times a day (TID) | ORAL | Status: DC
  Administered 2017-07-13 – 2017-07-16 (×9): 50 mg via ORAL
  Filled 2017-07-13 (×9): qty 1

## 2017-07-13 MED ORDER — BENAZEPRIL HCL 20 MG PO TABS
20.0000 mg | ORAL_TABLET | Freq: Once | ORAL | Status: AC
  Administered 2017-07-13: 20 mg via ORAL
  Filled 2017-07-13: qty 1

## 2017-07-13 MED ORDER — BENAZEPRIL HCL 40 MG PO TABS
40.0000 mg | ORAL_TABLET | Freq: Two times a day (BID) | ORAL | Status: DC
  Administered 2017-07-13 – 2017-07-16 (×6): 40 mg via ORAL
  Filled 2017-07-13 (×6): qty 1

## 2017-07-13 MED ORDER — BENAZEPRIL HCL 40 MG PO TABS: 40.0000 mg | ORAL_TABLET | Freq: Two times a day (BID) | ORAL | Status: DC

## 2017-07-13 MED ORDER — INSULIN GLARGINE 100 UNIT/ML ~~LOC~~ SOLN
20.0000 [IU] | Freq: Every day | SUBCUTANEOUS | Status: DC
  Administered 2017-07-13 – 2017-07-15 (×3): 20 [IU] via SUBCUTANEOUS
  Filled 2017-07-13 (×4): qty 0.2

## 2017-07-13 MED ORDER — POTASSIUM CHLORIDE CRYS ER 20 MEQ PO TBCR
40.0000 meq | EXTENDED_RELEASE_TABLET | Freq: Once | ORAL | Status: AC
  Administered 2017-07-13: 40 meq via ORAL
  Filled 2017-07-13: qty 2

## 2017-07-13 NOTE — Progress Notes (Signed)
Social Work Patient ID: [name] [name], male   DOB: Aug 21, 1945, 73 y.o.   MRN: [id number]   CSW met with pt and his family (wife, mother, sister, and dtr) 07-12-17 to update them on team conference discussion and to notify them on pt's d/c on 07-16-17.  Pt's wife was a little take aback, but then CSW answered a lot of her questions and she seemed a little calmer about pt going home sooner than she expected.  She wanted to talk with someone about diabetic eating.  CSW talked with Reesa Chew, PA and she has consulted dietician to come meet with them.  Wife also wondered about connecting pt's loop recorder and Olin Hauser stated it should be connected in the room.  CSW called to alert Chelsea, RN,07-12-17 and assisted pt/wife with connection.  Olin Hauser met with wife this morning to talk with her about wife's concerns about BP.  CSW arranged outpt therapies.  Pt does not need any DME.  Wife going through therapies with pt today.  CSW remains available to assist as needed.

## 2017-07-13 NOTE — Discharge Summary (Signed)
Physician Discharge Summary  Patient ID: [name] [name] MRN: [id number] DOB/AGE: [birth year] 72 y.o.  Admit date: 07/11/2017 Discharge date: 07/16/2017  Discharge Diagnoses:  Principal Problem:   Acute ischemic left MCA stroke [facility]) Active Problems:   Diabetes mellitus type 2 in obese [facility])   Stage 3 chronic kidney disease ([place])   Acute blood loss anemia   Hypokalemia   Uncontrolled hypertension   Hypertensive urgency   Hypoglycemia   Discharged Condition: stable.   Significant Diagnostic Studies: N/A   Labs:  Basic Metabolic Panel: Recent Labs  Lab 07/12/17 1518 07/14/17 0534 07/16/17 0804  NA 140 140 141  K 3.4* 3.2* 3.6  CL 112* 109 112*  CO2 22 22 21*  GLUCOSE 134* 87 162*  BUN 30* 30* 30*  CREATININE 2.41* 2.38* 2.50*  CALCIUM 8.7* 8.7* 8.7*  MG  --  1.9  --     CBC: CBC Latest Ref Rng & Units 07/12/2017 07/08/2017 07/08/2017  WBC 4.0 - 10.5 K/uL 7.1 - 7.7  Hemoglobin 13.0 - 17.0 g/dL 11.8(L) 12.6(L) 11.5(L)  Hematocrit 39.0 - 52.0 % 34.3(L) 37.0(L) 34.0(L)  Platelets 150 - 400 K/uL 217 - 236    CBG: Recent Labs  Lab 07/14/17 2045 07/15/17 0650 07/15/17 1707 07/15/17 2124 07/16/17 0643  GLUCAP 119* 89 99 133* 111*    Brief HPI:   [name] [name] a 72 y.o.malewith history of HTN, T2DM, who was admitted on 01/05/19with difficulty speaking and mild right sided weakness. BP elevated in ED and CT head negative of for hemorrhage. CTA/Perfusion brain showed lipid laden plaques extending almost 2 cm into L-ICA concerning for recurrent/distal emboli. MRI brains showed multifocal areas of restricted diffusion in L-MCA territory--larges in left frontal cortex and subcortical white matter felt to be embolic. On ASA for secondary stroke prevention andVascular consult recommended for L-ICA plaque. Loop recorder placed for work up Patient limited by right sided weakness with inattention, difficulty processing higher levels commands, apraxia and aphasia.Therapy  evaluations completed yesterday and CIR recommended due to functional deficits.   Hospital Course: [name] [name] was admitted to rehab 07/11/2017 for inpatient therapies to consist of PT, ST and OT at least three hours five days a week. Past admission physiatrist, therapy team and rehab RN have worked together to provide customized collaborative inpatient rehab. His blood pressures continued to be labile and home medications were resumed with close monitoring to avoid hypotension. Hydralazine was increased to 50 mg tid as he was not compliant with lasix at home (disliked it) and labetalol was resumed at 100 mg bid on 1/11 with improvement in BP. He was noted to be hypokalemic and he was supplemented with potassium bid.  Hypokalemia has resolved and he is to continue on home dose at discharge. Renal status has been monitred and he has had rise in SCr to 1.5. He was encouraged to increase fluid intake. Follow up CBC showed anemia is relatively stable. .   Diabetes has been monitored with ac/hs cbg checks and starlix was resumed for tighter BS control. He has had issues with hypoglycemia therefore starlix was decreased to 60 mg tid ac with recommendations to monitor BS tid-qid at discharge. Patient and wife have been educated on [place].  He has had improvement in verbal output and continues to requires occasional cues for left inattention. He will continue to receive follow up outpatient PT, OT and ST at [facility] after discharge.    Rehab course: During patient's stay in rehab team  conference washeld to discuss patient's progress, set goals and discuss barriers to discharge. At admission, patient required min assist with ADL tasks and min assist with mobility.  He exhibited moderate non-fluent aphasia affecting expression and mild breakdown noted with higher level tasks.  He has had improvement in activity tolerance, balance, postural control, as well as ability to compensate for deficits. He is  has had improvement in functional use RUE  and RLE as well as improved awareness He is able to complete ADL tasks at modified independent level and requires extra time to complete tasks. He is modified independent for transfers and requires supervision with ambulation. He has had improvement in expressive abilities and is able to produce simple sentences at conversation level with minimal dysfluency. He requires min assist with complex problems solving tasks. Family education was completed regarding all aspects of care.     Disposition: Home   Diet: Heart healthy/Diabetic.   Special Instructions: 1. No driving or strenuous activity till cleared by MD. 2. Wife to assist with medication  Management.  3. Follow up with nephrology in the next couple of weeks for repeat BMET and adjustment of BP medications.    Discharge Instructions    Ambulatory referral to Physical Medicine Rehab   Complete by:  As directed    1-2 weeks transitional care appt     Allergies as of 07/16/2017      Reactions   Bactrim [sulfamethoxazole-trimethoprim] Hives, Itching, Swelling   Penicillins    Has patient had a PCN reaction causing immediate rash, facial/tongue/throat swelling, SOB or lightheadedness with hypotension: Unk Has patient had a PCN reaction causing severe rash involving mucus membranes or skin necrosis: Unk Has patient had a PCN reaction that required hospitalization: Unk Has patient had a PCN reaction occurring within the last 10 years: No If all of the above answers are "NO", then may proceed with Cephalosporin use.   Statins    myalgia   Sulfa Drugs Cross Reactors Hives, Itching, Swelling      Medication List    STOP taking these medications   amLODipine 10 MG tablet Commonly known as:  NORVASC   cyclobenzaprine 10 MG tablet Commonly known as:  FLEXERIL   fluvastatin 40 MG capsule Commonly known as:  LESCOL   furosemide 80 MG tablet Commonly known as:  LASIX   insulin aspart 100  UNIT/ML injection Commonly known as:  novoLOG     TAKE these medications   aspirin 325 MG EC tablet Take 1 tablet (325 mg total) by mouth daily.   benazepril 40 MG tablet Commonly known as:  LOTENSIN Take 1 tablet (40 mg total) by mouth 2 (two) times daily.   calcitRIOL 0.25 MCG capsule Commonly known as:  ROCALTROL Take 1 capsule by mouth daily.   ezetimibe 10 MG tablet Commonly known as:  ZETIA Take 1 tablet (10 mg total) by mouth daily.   glucose blood test strip Commonly known as:  ONETOUCH VERIO 1 each by Other route daily. Use as instructed   hydrALAZINE 50 MG tablet Commonly known as:  APRESOLINE Take 1 tablet (50 mg total) by mouth 3 (three) times daily. What changed:    medication strength  how much to take   Insulin Glargine 100 UNIT/ML Solostar Pen Commonly known as:  LANTUS SOLOSTAR INJECT 20 UNITS SUBCUTANEOUSLY ONCE DAILY. NEED TO MAKE APPOINTMENT. What changed:    how much to take  how to take this  when to take this  additional instructions  labetalol 200 MG tablet Commonly known as:  NORMODYNE Take 0.5 tablets (100 mg total) by mouth 2 (two) times daily. What changed:  how much to take   nateglinide 60 MG tablet Commonly known as:  STARLIX Take 1 tablet (60 mg total) by mouth 3 (three) times daily with meals. What changed:    medication strength  how much to take   potassium chloride SA 20 MEQ tablet Commonly known as:  K-DUR,KLOR-CON Take 1 tablet (20 mEq total) by mouth daily.      Follow-up Information    Jamse Arn, MD Follow up.   Specialty:  Physical Medicine and Rehabilitation Why:  office will call you with follow up appointment Contact information: [address] [place] [address] [phone]        Garvin Fila, MD Follow up.   Specialties:  Neurology, Radiology Why:  Call today for follow up appointment Contact information: [address] [place]  [address] [phone]        Dorothyann Peng, NP Follow up on 07/25/2017.   Specialty:  Family Medicine Why:  @ 1:30 PM with arrival at 1:15 PM Contact information: [place] [address] [phone]           Signed: Bary Leriche 07/17/2017, 10:34 AM

## 2017-07-13 NOTE — Progress Notes (Signed)
Called [name] [name], gave b/p 199/106, advised to go ahead and gv 1800 dose of Lotensin.

## 2017-07-13 NOTE — Significant Event (Signed)
Hypoglycemic Event  CBG: 67  Treatment: 15 GM carbohydrate snack  Symptoms: None  Follow-up CBG: Time:1735 CBG Result:98  Possible Reasons for Event: Unknown  Comments/MD notified:Pamela Love, PA.     Barrett Shell

## 2017-07-13 NOTE — Progress Notes (Signed)
[facility] PHYSICAL MEDICINE & REHABILITATION     PROGRESS NOTE  Subjective/Complaints:  Patient seen sitting up in bed the bed this morning. Wife at bedside. He notes a good first day in therapies. Wife has questions about blood pressure.  ROS: Denies CP, SOB, N/V/D.  Objective: Vital Signs: Blood pressure (!) 192/81, pulse 75, temperature 98.9 F (37.2 C), temperature source Oral, resp. rate 18, height 6\' 3"  (1.905 m), weight 111 kg (244 lb 11.4 oz), SpO2 99 %. No results found. Recent Labs    07/12/17 1518  WBC 7.1  HGB 11.8*  HCT 34.3*  PLT 217   Recent Labs    07/12/17 1518  NA 140  K 3.4*  CL 112*  GLUCOSE 134*  BUN 30*  CREATININE 2.41*  CALCIUM 8.7*   CBG (last 3)  Recent Labs    07/12/17 1654 07/12/17 2128 07/13/17 0621  GLUCAP 104* 96 98    Wt Readings from Last 3 Encounters:  07/12/17 111 kg (244 lb 11.4 oz)  07/08/17 111.1 kg (244 lb 14.9 oz)  06/19/17 112 kg (247 lb)    Physical Exam:  BP (!) 192/81 (BP Location: Right Arm)   Pulse 75   Temp 98.9 F (37.2 C) (Oral)   Resp 18   Ht 6\' 3"  (1.905 m)   Wt 111 kg (244 lb 11.4 oz)   SpO2 99%   BMI 30.59 kg/m  Constitutional: He appearswell-developedand well-nourished.  HENT: Normocephalicand atraumatic.  Eyes:EOMare normal. No discharge.  Cardiovascular:RRR. No JVD. Respiratory:Effort normaland breath sounds normal.  GB:TDVVO sounds are normal. There isno tenderness.  Musculoskeletal: He exhibits noedema, no tenderness.  Neurological: He isalertand oriented x3 with cues and additional time. Dysarthria.  Followed all one step commands with delay.  Sensation grossly intact to light touch Motor: RUE: 4+/5 prox to distal.  RLE: 4+/5 proximal to distal (unchanged) LUE/LLE: 5/5 proximal to distal.  Skin: Skin iswarmand dry.  Psychiatric: His affect isblunt. He isslowed. Cognition and memory areimpaired.Flat.  Assessment/Plan: 1. Functional deficits secondary to left MCA  infarct which require 3+ hours per day of interdisciplinary therapy in a comprehensive inpatient rehab setting. Physiatrist is providing close team supervision and 24 hour management of active medical problems listed below. Physiatrist and rehab team continue to assess barriers to discharge/monitor patient progress toward functional and medical goals.  Function:  Bathing Bathing position   Position: Shower  Bathing parts Body parts bathed by patient: Right arm, Left arm, Chest, Abdomen, Front perineal area, Buttocks, Right upper leg, Left upper leg, Right lower leg, Left lower leg, Back    Bathing assist Assist Level: Supervision or verbal cues      Upper Body Dressing/Undressing Upper body dressing   What is the patient wearing?: Hospital gown                Upper body assist Assist Level: Touching or steadying assistance(Pt > 75%)      Lower Body Dressing/Undressing Lower body dressing   What is the patient wearing?: Pants, Non-skid slipper socks     Pants- Performed by patient: Thread/unthread right pants leg, Thread/unthread left pants leg, Pull pants up/down   Non-skid slipper socks- Performed by patient: Don/doff right sock, Don/doff left sock                    Lower body assist Assist for lower body dressing: Touching or steadying assistance (Pt > 75%), Supervision or verbal cues      Toileting Toileting  Toileting assist     Transfers Chair/bed transfer     Chair/bed transfer assist level: Touching or steadying assistance (Pt > 75%)       Locomotion Ambulation     Max distance: 150 Assist level: Touching or steadying assistance (Pt > 75%)   Wheelchair          Cognition Comprehension Comprehension assist level: Understands basic 75 - 89% of the time/ requires cueing 10 - 24% of the time  Expression Expression assist level: Expresses basic 50 - 74% of the time/requires cueing 25 - 49% of the time. Needs to repeat parts of  sentences.  Social Interaction Social Interaction assist level: Interacts appropriately 75 - 89% of the time - Needs redirection for appropriate language or to initiate interaction.  Problem Solving Problem solving assist level: Solves basic 75 - 89% of the time/requires cueing 10 - 24% of the time  Memory Memory assist level: Recognizes or recalls 90% of the time/requires cueing < 10% of the time    Medical Problem List and Plan: 1.Right hemparesis and aphasiasecondary to left MCA infarct   Continue CIR 2. DVT Prophylaxis/Anticoagulation: Pharmaceutical:Lovenox 3. Pain Management:N/A 4. Mood:LCSW to follow for evaluation and support. 5. Neuropsych: This patientis not fullycapable of making decisions on hisown behalf. 6. Skin/Wound Care:Pressure relief measures. Maintain adequate hydration and nutritional status. 7. Fluids/Electrolytes/Nutrition:Monitor I/O.  8. HTN: Monitor BP bid. Used Lotensin bid, Norvasc, lasix and hydralazine tid at home.    Hydralazine increased to 50 on 1/10   Lotensin 20 mg started on 1/9, increased to 40 twice a day on 1/10.    Hypertensive crisis overnight 9. CKD stage III: Followed by Dr. Jimmy Footman.Monitor lytes serially. Resume calcitriol.   Cr 2.41 on 1/9   Encourage fluid intake   Cont to monitor 10. T2DM: Hgb A1C-8.1. Monitor BS ac/hs. Continue lantus    Relatively controlled on 1/10 11. Dyslipidemia: Unable to tolerate statins. Now on Zetia.  12. Left ICA plaque: Follow up with vascular after discharge. 13. Obesity   Body mass index is 30.59 kg/m.  Diet and exercise education   Encourage weight loss to increase endurance and promote overall health 14. Hypokalemia   Potassium 3.4 on 1/9   Supplemented x1 15. ABLA   Hemoglobin 11.8 on 1/9   Continue to monitor  LOS (Days) 2 A FACE TO FACE EVALUATION WAS PERFORMED  Raylynn Hersh Lorie Phenix 07/13/2017 9:02 AM

## 2017-07-13 NOTE — Progress Notes (Signed)
Occupational Therapy Session Note  Patient Details  Name: [name] [name] MRN: [id number] Date of Birth: 11-28-1945  Today's Date: 07/13/2017 OT Individual Time: 8295-6213 OT Individual Time Calculation (min): 75 min    Short Term Goals: Week 1:  OT Short Term Goal 1 (Week 1): STGs = LTGs  Skilled Therapeutic Interventions/Progress Updates:    Pt seen this session for ADL training and family education with pt's wife with a focus on perceptual skills of R environmental awareness, spatial relations, and balance.  Pt was able to ambulate in room with close S, get in shower with S, showered with Set up using tub seat.  He ambulated back to bed to dress with no verbal cues needed demonstrating functional dressing praxis and [name] with tying shoes.  Pt's blood pressure was 168/102.  For the rest of the session, took several small rest breaks.  -Pt's wife described set up of home bathroom.  Pt ambulated to ADL apt to practice stepping in and out of tub with S.  Discussed safe strategies with use of grab bars, bath mats.  He will benefit from a small shower seat. Wife believes that a relative may have one that they can borrow.   -Pt worked on sit to stands from low couch and recliner with S. -sweeping floor with broom with cues to visually scan to find all items on floor, cues for technique to sweep into dust pan, and to find trash can on his R side -simulated ironing with a cool iron -ambulated back to room with instructions to call out each room number he saw on his R side. Pt was able to do so accurately.  Pt's wife stated he only does grilling and makes pour over coffee.  Will discharge meal prep goal.    Will continue to focus on home management goal.   Pt returned to room with all needs met.  Therapy Documentation Precautions:  Precautions Precautions: Fall Restrictions Weight Bearing Restrictions: No    Vital Signs: Therapy Vitals Pulse Rate: 85 BP: (!) 177/95 Pain: Pain  Assessment Pain Assessment: No/denies pain ADL: ADL ADL Comments: refer to functional navigator  See Function Navigator for Current Functional Status.   Therapy/Group: Individual Therapy  [place] 07/13/2017, 12:30 PM

## 2017-07-13 NOTE — Progress Notes (Signed)
Physical Therapy Note  Patient Details  Name: [name] [name] MRN: [id number] Date of Birth: 1946/03/20 Today's Date: 07/13/2017  1045-1200, 75 min individual tx Pain: none per pt  Dynamic balance/dual task of making his hospital bed; linens were already in room.  Pt needed extra time and min cues for problem- solving the fit of the bottom sheet; he was unable after 3 tries to figure out the length/width for proper orientation on the bed.  No LOB.  Pt initally attempted to stand in 1 place and apply sheets, but eventually relaized that he would have to walk around the bed.  Pt attempted to place the bed pad on top of the bedspread.  After discussion, pt was able to figure out where to put it, given 3 choices.  Neuromuscular re-education via forced use and multimodal cues for alternating reciprocal movement x bil LEs in sitting and standing, on Kinetron at 60>45 cm/sec resistance, with bil UE support, fading to 0UE support.  RLE eccentric control and trunk righting deficits noted.  RLE biased standing x 1 minute without UE support with tactile cues for R trunk elongation, L trunk shortening, and hip extension. Sustained stretch bil heel cords in standing on wedge during bil fine motor task with bil hands on vertical board in front of hime, removing nuts, washers and bolts from board, with droppage from R hand 1/8 reps.  Advanced gait training without AD to return to room, kicking Yoga block with R foot, encouraging R knee flexion and ballistic movement R hip and knee extensors to contact block.  Pt left sitting on EOB, with [name], NT attending pt.  See function navigator for current status.   Khylon Davies 07/13/2017, 11:09 AM

## 2017-07-13 NOTE — Progress Notes (Signed)
Speech Language Pathology Daily Session Note  Patient Details  Name: [name] [name] MRN: [id number] Date of Birth: [birth year]  Today's Date: 07/13/2017 SLP Individual Time: 1330-1400 SLP Individual Time Calculation (min): 30 min  Short Term Goals: Week 1: SLP Short Term Goal 1 (Week 1): STGs=LTGs due to short length of stay   Skilled Therapeutic Interventions: Skilled treatment session focused on communication goals. Patient named functional items at the word level with 90% accuracy and supervision phonemic cues and compared/contrasted 2 items at the phrase level with Min A phonemic cues for word-finding. Patient participated in an informal conversation with extra time and Min A verbal cues. Patient left upright in recliner with all needs within reach. Continue with current plan of care.      Function:   Cognition Comprehension Comprehension assist level: Understands basic 75 - 89% of the time/ requires cueing 10 - 24% of the time  Expression   Expression assist level: Expresses basic 75 - 89% of the time/requires cueing 10 - 24% of the time. Needs helper to occlude trach/needs to repeat words.  Social Interaction Social Interaction assist level: Interacts appropriately 75 - 89% of the time - Needs redirection for appropriate language or to initiate interaction.  Problem Solving Problem solving assist level: Solves basic 90% of the time/requires cueing < 10% of the time  Memory Memory assist level: Recognizes or recalls 90% of the time/requires cueing < 10% of the time    Pain Pain Assessment Pain Assessment: No/denies pain  Therapy/Group: Individual Therapy  [name] [name], [name] 07/13/2017, 3:21 PM

## 2017-07-13 NOTE — Progress Notes (Cosign Needed)
Social Work Discharge Note  The overall goal for the admission was met for:   Discharge location: Yes - home with his wife  Length of Stay: Yes - 5 days  Discharge activity level: Yes - modified independent to supervision  Home/community participation: Yes  Services provided included: MD, RD, PT, OT, SLP, RN, Pharmacy and SW  Financial Services: Private Insurance: [employer]  Follow-up services arranged: Outpatient: PT/OT/ST at [facility] and Patient/Family has no preference for HH/DME agencies  No DME needed  Comments (or additional information): Pt's wife received family education.  F/U arranged with outpt therapies.  Pt needed no DME, but can use shower seat in tub if he finds he needs it.  Gave info on stroke support group.    Patient/Family verbalized understanding of follow-up arrangements: Yes  Individual responsible for coordination of the follow-up plan: pt and his wife  Confirmed correct DME delivered: Trey Sailors 07/13/2017    Kaisy Severino, Silvestre Mesi

## 2017-07-13 NOTE — Progress Notes (Signed)
[facility] Individual Statement of Services  Patient Name:  [name] [name]  Date:  07/13/2017  Welcome to the [facility].  Our goal is to provide you with an individualized program based on your diagnosis and situation, designed to meet your specific needs.  With this comprehensive rehabilitation program, you will be expected to participate in at least 3 hours of rehabilitation therapies Monday-Friday, with modified therapy programming on the weekends.  Your rehabilitation program will include the following services:  Physical Therapy (PT), Occupational Therapy (OT), Speech Therapy (ST), 24 hour per day rehabilitation nursing, Case Management (Social Worker), Rehabilitation Medicine, Nutrition Services and Pharmacy Services  Weekly team conferences will be held on Wednesdays to discuss your progress.  Your Social Worker will talk with you frequently to get your input and to update you on team discussions.  Team conferences with you and your family in attendance may also be held.  Expected length of stay: <a week  Overall anticipated outcome: Modified independent with supervision for stairs, meal preparation, and shower transfers  Depending on your progress and recovery, your program may change. Your Social Worker will coordinate services and will keep you informed of any changes. Your Social Worker's name and contact numbers are listed  below.  The following services may also be recommended but are not provided by the [facility] will be made to provide these services after discharge if needed.  Arrangements include referral to agencies that provide these services.  Your insurance has been verified to be:  [employer] Your primary doctor is:  Dorothyann Peng, NP  Pertinent information will be shared with  your doctor and your insurance company.  Social Worker:  Alfonse Alpers, LCSW  [phone] or [phone]  Information discussed with and copy given to patient by: Trey Sailors, 07/13/2017, 12:00 PM

## 2017-07-13 NOTE — Patient Care Conference (Signed)
Inpatient RehabilitationTeam Conference and Plan of Care Update Date: 07/12/2017   Time: 2:45 PM    Patient Name: [name] [name]      Medical Record Number: [id number]  Date of Birth: 1945/09/07 Sex: Male         Room/Bed: 4M09C/4M09C-01 Payor Info: Payor: Marine scientist / Plan: [employer] MEDICARE / Product Type: *No Product type* /    Admitting Diagnosis: CVA  Admit Date/Time:  07/11/2017  5:37 PM Admission Comments: No comment available   Primary Diagnosis:  <principal problem not specified> Principal Problem: <principal problem not specified>  Patient Active Problem List   Diagnosis Date Noted  . Hypertensive crisis   . Acute blood loss anemia   . Hypokalemia   . Acute ischemic left MCA stroke ([place]) 07/12/2017  . Diabetes mellitus type 2 in obese ([place])   . Stage 3 chronic kidney disease ([place])   . Benign essential HTN   . Left middle cerebral artery stroke ([place]) 07/09/2017  . Acute embolic stroke ([place])   . Aphasia 07/08/2017  . Gout 07/21/2011  . Prostatitis, chronic 07/14/2010  . RENAL INSUFFICIENCY 02/05/2008  . Hyperlipidemia 06/18/2007  . DM type 2 (diabetes mellitus, type 2) ([place]) 01/02/2007  . Hypertensive kidney disease 01/02/2007    Expected Discharge Date: Expected Discharge Date: 07/16/17  Team Members Present: Physician leading conference: Dr. Delice Lesch Social Worker Present: Alfonse Alpers, LCSW Nurse Present: Leonette Nutting, RN PT Present: Roderic Ovens, PT OT Present: Willeen Cass, OT SLP Present: Windell Moulding, SLP PPS Coordinator present : Daiva Nakayama, RN, CRRN     Current Status/Progress Goal Weekly Team Focus  Medical   Right hemparesis and aphasia secondary to left MCA infarct  Improve mobility, BP, CBGs  See above   Bowel/Bladder   Continent of B/B, Last BM 07/11/17  Maintanin continence and regular BM's  Assess Qshift and PRN   Swallow/Nutrition/ Hydration             ADL's   S overall with mod cues to attend to R side and  environment  mod I overall with BADLs, S with tub transfer and IADLs  ADL training with a focus on perceptual skills and balance, pt/ family education   Mobility   supervision  mod I/supervision  family ed, d/c planning   Communication   Mod A  Min A  word-finding strategies, expanding length of utterance    Safety/Cognition/ Behavioral Observations  Min A  Supervision  safety awareness, attention to right enviornment    Pain   Denies pain  Pain < 2  Assess pain Qshift and PRN   Skin   Steri strips, guaze, and tegaderm to new loop recorder site  Prevent skin breakdown and infecction  Assess Q shift and PRN    Rehab Goals Patient on target to meet rehab goals: Yes Rehab Goals Revised: none *See Care Plan and progress notes for long and short-term goals.     Barriers to Discharge  Current Status/Progress Possible Resolutions Date Resolved   Physician    Medical stability     See above  Therapies, optimize BP/DM meds      Nursing                  PT                    OT                  SLP  SW                Discharge Planning/Teaching Needs:  Pt to return to his home with his wife to provide necessary supervision.  Pt's wife plans to go through therapies with pt prior to d/c.   Team Discussion:  Dr. Posey Pronto is adjusting pt's blood pressure medication and insulin.  RN stated pt is doing well besides blood pressure.  Pt with minimal balance difficulties.  Pt with higher level aphasia according to speech therapist.  Revisions to Treatment Plan:  none    Continued Need for Acute Rehabilitation Level of Care: The patient requires daily medical management by a physician with specialized training in physical medicine and rehabilitation for the following conditions: Daily direction of a multidisciplinary physical rehabilitation program to ensure safe treatment while eliciting the highest outcome that is of practical value to the patient.: Yes Daily medical  management of patient stability for increased activity during participation in an intensive rehabilitation regime.: Yes Daily analysis of laboratory values and/or radiology reports with any subsequent need for medication adjustment of medical intervention for : Neurological problems;Diabetes problems;Blood pressure problems  Luella Gardenhire, Silvestre Mesi 07/13/2017, 12:41 PM

## 2017-07-13 NOTE — Progress Notes (Signed)
Social Work Assessment and Plan  Patient Details  Name: [name] [name] MRN: [id number] Date of Birth: 1946-06-05  Today's Date: 07/12/2017  Problem List:  Patient Active Problem List   Diagnosis Date Noted  . Hypertensive crisis   . Acute blood loss anemia   . Hypokalemia   . Acute ischemic left MCA stroke ([place]) 07/12/2017  . Diabetes mellitus type 2 in obese ([place])   . Stage 3 chronic kidney disease ([place])   . Benign essential HTN   . Left middle cerebral artery stroke ([place]) 07/09/2017  . Acute embolic stroke ([place])   . Aphasia 07/08/2017  . Gout 07/21/2011  . Prostatitis, chronic 07/14/2010  . RENAL INSUFFICIENCY 02/05/2008  . Hyperlipidemia 06/18/2007  . DM type 2 (diabetes mellitus, type 2) ([place]) 01/02/2007  . Hypertensive kidney disease 01/02/2007   Past Medical History:  Past Medical History:  Diagnosis Date  . DIABETES MELLITUS, TYPE II 01/02/2007  . HYPERLIPIDEMIA 06/18/2007  . HYPERTENSION 01/02/2007  . RENAL INSUFFICIENCY 02/05/2008   Past Surgical History:  Past Surgical History:  Procedure Laterality Date  . LOOP RECORDER INSERTION N/A 07/11/2017   Procedure: LOOP RECORDER INSERTION;  Surgeon: Thompson Grayer, MD;  Location: [facility];  Service: Cardiovascular;  Laterality: N/A;  . SHOULDER SURGERY     left  . TEE WITHOUT CARDIOVERSION N/A 07/11/2017   Procedure: TRANSESOPHAGEAL ECHOCARDIOGRAM (TEE);  Surgeon: Dorothy Spark, MD;  Location: [facility] ENDOSCOPY;  Service: Cardiovascular;  Laterality: N/A;  . TRANSURETHRAL RESECTION OF PROSTATE     history of retention/hematuria    Social History:  reports that  has never smoked. he has never used smokeless tobacco. He reports that he does not drink alcohol or use drugs.  Family / Support Systems Marital Status: Married Patient Roles: Spouse, Parent, Volunteer Spouse/Significant Other: [name] [name] - wife - [phone]) [phone] (h); [phone] (m) Children: 2 dtrs in [place] Other Supports: sister, mother,  other extended family Anticipated Caregiver: Spouse as needed Ability/Limitations of Caregiver: Limited ability to physically assist; can provide supervision Caregiver Availability: 24/7 Family Dynamics: supportive, close family.  concerned about pt.  Social History Preferred language: English Religion:  Education: [employer] Read: Yes Write: Yes Employment Status: Retired(firefighter) Public relations account executive Issues: none reported Guardian/Conservator: Dr. Posey Pronto feels pt is not yet fully capable of making his own decisions, so wife, as next of kin is supporting pt with this.   Abuse/Neglect Abuse/Neglect Assessment Can Be Completed: Yes Physical Abuse: Denies Verbal Abuse: Denies Sexual Abuse: Denies Exploitation of patient/patient's resources: Denies Self-Neglect: Denies  Emotional Status Pt's affect, behavior and adjustment status: Pt is quiet by nature, but reports feeling okay and is pleased with his progress. Recent Psychosocial Issues: none reported Psychiatric History: none reported Substance Abuse History: none reported  Patient / Family Perceptions, Expectations & Goals Pt/Family understanding of illness & functional limitations: Pt/family report a good understanding of his condition.  Wife continues to be concerned about pt's fluctuating blood pressure.   Premorbid pt/family roles/activities: Pt was very active PTA - exercised regularly at fitness center, did the yard work, volunteered at Building surveyor through church.  Likes [employer] football. Anticipated changes in roles/activities/participation: Pt would like to resume activities as he can. Pt/family expectations/goals: Pt wants to regain his independence.  Community Resources Express Scripts: None Premorbid Home Care/DME Agencies: None Transportation available at discharge: family  Discharge Planning Living Arrangements: Spouse/significant other Support Systems: Spouse/significant other, Children, Other relatives,  Friends/neighbors, Social worker community Type of Residence: Private residence  Insurance Resources: Multimedia programmer (Regulatory affairs officer) Financial Resources: Radio broadcast assistant Screen Referred: No Money Management: Patient, Spouse Does the patient have any problems obtaining your medications?: No Home Management: Pt and wife share household chores.  Family can assist. Patient/Family Preliminary Plans: Pt's wife to provide pt with 24/7 supervision at home.  Social Work Anticipated Follow Up Needs: HH/OP, Support Group Expected length of stay: <a week  Clinical Impression CSW met with pt and his family to introduce self and role of CSW, as well as complete assessment.  Pt with a short length of stay and pt's family was a little caught off guard about this, but are prepared to take him home.  Wife is to go through family education and PA, [name] [name] has spoken with wife.  Wife mainly concerned about pt's blood pressure and dtr is concerned he will try to do too much.  CSW explained that Dr. Posey Pronto will be on this weekend and that he knows the pt and will not d/c him if he is not stable.  That seemed to calm family.  CSW to arrange outpt therapy and pt does not need any DME.  CSW remains available to assist as needed.  [name], [name] Mesi 07/13/2017, 1:17 PM

## 2017-07-14 ENCOUNTER — Inpatient Hospital Stay (HOSPITAL_COMMUNITY): Payer: Medicare Other | Admitting: Physical Therapy

## 2017-07-14 ENCOUNTER — Inpatient Hospital Stay (HOSPITAL_COMMUNITY): Payer: Medicare Other | Admitting: Speech Pathology

## 2017-07-14 ENCOUNTER — Inpatient Hospital Stay (HOSPITAL_COMMUNITY): Payer: Medicare Other

## 2017-07-14 DIAGNOSIS — I160 Hypertensive urgency: Secondary | ICD-10-CM

## 2017-07-14 DIAGNOSIS — I10 Essential (primary) hypertension: Secondary | ICD-10-CM

## 2017-07-14 LAB — BASIC METABOLIC PANEL
Anion gap: 9 (ref 5–15)
BUN: 30 mg/dL — AB (ref 6–20)
CO2: 22 mmol/L (ref 22–32)
CREATININE: 2.38 mg/dL — AB (ref 0.61–1.24)
Calcium: 8.7 mg/dL — ABNORMAL LOW (ref 8.9–10.3)
Chloride: 109 mmol/L (ref 101–111)
GFR calc Af Amer: 30 mL/min — ABNORMAL LOW (ref >60)
GFR calc non Af Amer: 26 mL/min — ABNORMAL LOW (ref >60)
GLUCOSE: 87 mg/dL (ref 65–99)
Potassium: 3.2 mmol/L — ABNORMAL LOW (ref 3.5–5.1)
SODIUM: 140 mmol/L (ref 135–145)

## 2017-07-14 LAB — GLUCOSE, CAPILLARY
GLUCOSE-CAPILLARY: 52 mg/dL — AB (ref 65–99)
GLUCOSE-CAPILLARY: 100 mg/dL — AB (ref 65–99)
Glucose-Capillary: 89 mg/dL (ref 65–99)
Glucose-Capillary: 87 mg/dL (ref 65–99)
Glucose-Capillary: 119 mg/dL — ABNORMAL HIGH (ref 65–99)

## 2017-07-14 LAB — MAGNESIUM: MAGNESIUM: 1.9 mg/dL (ref 1.7–2.4)

## 2017-07-14 MED ORDER — EZETIMIBE 10 MG PO TABS: 10.0000 mg | ORAL_TABLET | Freq: Every day | ORAL | 1 refills | Status: DC

## 2017-07-14 MED ORDER — LABETALOL HCL 100 MG PO TABS
100.0000 mg | ORAL_TABLET | Freq: Two times a day (BID) | ORAL | Status: DC
  Administered 2017-07-14 – 2017-07-16 (×5): 100 mg via ORAL
  Filled 2017-07-14 (×5): qty 1

## 2017-07-14 MED ORDER — POTASSIUM CHLORIDE CRYS ER 20 MEQ PO TBCR: 20.0000 meq | EXTENDED_RELEASE_TABLET | Freq: Every day | ORAL | 0 refills | Status: DC

## 2017-07-14 MED ORDER — LABETALOL HCL 200 MG PO TABS: 100.0000 mg | ORAL_TABLET | Freq: Two times a day (BID) | ORAL | 0 refills | Status: DC

## 2017-07-14 MED ORDER — POTASSIUM CHLORIDE CRYS ER 20 MEQ PO TBCR: 20.0000 meq | EXTENDED_RELEASE_TABLET | Freq: Two times a day (BID) | ORAL | Status: DC

## 2017-07-14 MED ORDER — BENAZEPRIL HCL 40 MG PO TABS: 40.0000 mg | ORAL_TABLET | Freq: Two times a day (BID) | ORAL | 0 refills | Status: DC

## 2017-07-14 MED ORDER — POTASSIUM CHLORIDE CRYS ER 20 MEQ PO TBCR
20.0000 meq | EXTENDED_RELEASE_TABLET | Freq: Two times a day (BID) | ORAL | Status: DC
  Administered 2017-07-14 – 2017-07-16 (×5): 20 meq via ORAL
  Filled 2017-07-14 (×5): qty 1

## 2017-07-14 MED ORDER — POTASSIUM CHLORIDE CRYS ER 20 MEQ PO TBCR: 30.0000 meq | EXTENDED_RELEASE_TABLET | Freq: Two times a day (BID) | ORAL | Status: DC

## 2017-07-14 MED ORDER — INSULIN GLARGINE 100 UNIT/ML SOLOSTAR PEN: PEN_INJECTOR | SUBCUTANEOUS | 0 refills | Status: DC

## 2017-07-14 MED ORDER — HYDRALAZINE HCL 50 MG PO TABS: 50.0000 mg | ORAL_TABLET | Freq: Three times a day (TID) | ORAL | 0 refills | Status: DC

## 2017-07-14 MED ORDER — NATEGLINIDE 60 MG PO TABS
60.0000 mg | ORAL_TABLET | Freq: Three times a day (TID) | ORAL | Status: DC
  Administered 2017-07-14 – 2017-07-16 (×5): 60 mg via ORAL
  Filled 2017-07-14 (×6): qty 1

## 2017-07-14 MED ORDER — NATEGLINIDE 60 MG PO TABS: 60.0000 mg | ORAL_TABLET | Freq: Three times a day (TID) | ORAL | 0 refills | Status: DC

## 2017-07-14 MED ORDER — POTASSIUM CHLORIDE CRYS ER 10 MEQ PO TBCR: 10.0000 meq | EXTENDED_RELEASE_TABLET | Freq: Two times a day (BID) | ORAL | Status: DC

## 2017-07-14 NOTE — Progress Notes (Signed)
Occupational Therapy Session Note  Patient Details  Name: [name] [name] MRN: [id number] Date of Birth: 1945-12-26  Today's Date: 07/14/2017 OT Individual Time: 6599-3570 OT Individual Time Calculation (min): 71 min    Short Term Goals: Week 1:  OT Short Term Goal 1 (Week 1): STGs = LTGs  Skilled Therapeutic Interventions/Progress Updates:    1:1. Pt with no c/o pain. BP measured at beginning of session before activity 195/95. RN alerted and said that it is normal. BP assessed throughout session and decreased 153/90 later. Pt bathes and dresses in standing with supervision to chalange balance with VC to use grab bars to stabilize while doffing/donning pants. Pt stands at sink with supervision for oral/hair care. Pt ambulates throughout session with supervision and no AD. Pt completes kitchen search activity for R awareness/spatial relations with min VC for R scanning. Pt changes bed sheets in ADL apartment with supervision to challenge balance. Pt stands to complete puzzle in tx gym with picture reference on R with min VC for scanning R and mod question cues for correct orientation of puzzle pieces. Exited session with pt seated in recliner with call light in reach and all needs met  Therapy Documentation Precautions:  Precautions Precautions: Fall Restrictions Weight Bearing Restrictions: No General:    See Function Navigator for Current Functional Status.   Therapy/Group: Individual Therapy  Tonny Branch 07/14/2017, 10:25 AM

## 2017-07-14 NOTE — Plan of Care (Signed)
  RD consulted for nutrition education regarding diabetes and heart healthy diet.   Lab Results  Component Value Date   HGBA1C 8.1 (H) 07/09/2017    RD provided "Carbohydrate Counting for People with Diabetes" as well as "Heart Healthy Nutrition Therapy" handout from the Academy of Nutrition and Dietetics. No family at bedside. Noted wife requesting carbohydrate counting information. RD attempted pt visit twice throughout the day, however wife not present. Pt reports providing the education to him and will pass on the information to his wife. Discussed different food groups and their effects on blood sugar, emphasizing carbohydrate-containing foods. Provided list of carbohydrates and recommended serving sizes of common foods.  Discussed importance of controlled and consistent carbohydrate intake throughout the day. Provided examples of ways to balance meals/snacks and encouraged intake of high-fiber, whole grain complex carbohydrates. Discouraged the use of salt shakers at meals and encouraged fresh fruit, vegetable, and lean protein intake. Discussed diabetic friendly drink options. Teach back method used.  Expect good compliance.  Body mass index is 30.59 kg/m. Pt meets criteria for class I obesity based on current BMI.  Current diet order is heart healthy/carbohydrate modified, patient is consuming approximately 100% of meals at this time. Labs and medications reviewed. No further nutrition interventions warranted at this time. RD contact information provided. If additional nutrition issues arise, please re-consult RD. Plans to discharge Sunday.   Corrin Parker, MS, RD, LDN Pager # [phone] After hours/ weekend pager # [phone]

## 2017-07-14 NOTE — Progress Notes (Signed)
Physical Therapy Note  Patient Details  Name: [name] [name] MRN: [id number] Date of Birth: [birth year] Today's Date: 07/14/2017    Time: 1345-1440 55 minutes  1:1 No c/o pain.  BP 167/93 during session.  Gait throughout unit in controlled environments with distant supervision.  Gait outside on uneven surfaces, incline/decline and stair negotiation with 1 handrail with close supervision.  [place] exercises performed with 2# wt bilat for strength and balance training.  Standing balance on foam with pipe tree activity with supervision for standing balance, min questioning cues for correctly assembling pipe tree.   Standing balance with tap ups with min guard for reciprocal taps to 6'' step.  Coordination training with tapping colored dots in order with supervision for balance, pt gets 3 color sequences 100% correct, 4 color sequences 50% correct.  Gait and picking objects off the ground with supervision.  Pt left in room with needs at hand.  [name] 07/14/2017, 2:48 PM

## 2017-07-14 NOTE — Progress Notes (Signed)
Blood sugar dropped again this evening. Will decrease starlix to 60 mg tid ac. Instructed patient to check blood sugars tid to qid at home and follow up with primary provider for adjustment.

## 2017-07-14 NOTE — Discharge Instructions (Signed)
Inpatient Rehab Discharge Instructions  [name] [name] Discharge date and time:  07/16/17  Activities/Precautions/ Functional Status: Activity: no lifting, driving, or strenuous exercise for till cleared by MD Diet: cardiac diet and diabetic diet Wound Care: keep wound clean and dry    Functional status:  ___ No restrictions     ___ Walk up steps independently ___ 24/7 supervision/assistance   ___ Walk up steps with assistance _X__ Intermittent supervision/assistance  ___ Bathe/dress independently ___ Walk with walker     ___ Bathe/dress with assistance ___ Walk Independently    _X__ Shower independently ___ Walk with assistance    ___ Shower with assistance _X__ No alcohol     ___ Return to work/school ________   COMMUNITY REFERRALS UPON DISCHARGE:   Outpatient: PT     OT     ST  Agency:  [facility]                             [address] [place], [place]  [address]              Phone:  [phone]  Appointment Dates/Times: Monday, January 14 at 1:15 PM (Occupational) and 2 PM (Speech)                                                          Wednesday, January 16 at 10:15 AM (Physical) Medical Equipment/Items Ordered:  You can purchase a shower seat if you find it would be helpful, per your conversation with [name], [name].  No other equipment recommended.    GENERAL COMMUNITY RESOURCES FOR PATIENT/FAMILY: Support Groups:  [name] Stroke Support Group                              Meets the 2nd Thursday of each month from 3-4 PM (except June, July, and August)                              In the dayroom of [facility] on [address]                              For more information, call [phone]   Special Instructions:   [place] Cigarette smoking nearly doubles your risk of having a stroke & is the single most alterable risk factor  If you  smoke or have smoked in the last 12 months, you are advised to quit smoking for your health.  Most of the excess cardiovascular risk related to smoking disappears within a year of stopping.  Ask you doctor about anti-smoking medications  [place] Quit Line: 1-800-QUIT NOW  Free Smoking Cessation Classes [phone]  CHOLESTEROL Know your levels; limit fat & cholesterol in your diet  Lipid Panel     Component Value Date/Time   CHOL 279 (H) 07/09/2017 0327   TRIG 156 (H) 07/09/2017 0327   TRIG 323 (HH) 06/13/2006 1050  HDL 36 (L) 07/09/2017 0327   CHOLHDL 7.8 07/09/2017 0327   VLDL 31 07/09/2017 0327   LDLCALC 212 (H) 07/09/2017 0327      Many patients benefit from treatment even if their cholesterol is at goal.  Goal: Total Cholesterol (CHOL) less than 160  Goal:  Triglycerides (TRIG) less than 150  Goal:  HDL greater than 40  Goal:  LDL (LDLCALC) less than 100   BLOOD PRESSURE American Stroke Association blood pressure target is less that 120/80 mm/Hg  Your discharge blood pressure is:  BP: (!) 161/88  Monitor your blood pressure  Limit your salt and alcohol intake  Many individuals will require more than one medication for high blood pressure  DIABETES (A1c is a blood sugar average for last 3 months) Goal HGBA1c is under 7% (HBGA1c is blood sugar average for last 3 months)  Diabetes:     Lab Results  Component Value Date   HGBA1C 8.1 (H) 07/09/2017     Your HGBA1c can be lowered with medications, healthy diet, and exercise.  Check your blood sugar as directed by your physician  Call your physician if you experience unexplained or low blood sugars.  PHYSICAL ACTIVITY/REHABILITATION Goal is 30 minutes at least 4 days per week  Activity: No driving, Therapies: see above Return to work: n/A  Activity decreases your risk of heart attack and stroke and makes your heart stronger.  It helps control your weight and blood pressure; helps you relax and can improve your  mood.  Participate in a regular exercise program.  Talk with your doctor about the best form of exercise for you (dancing, walking, swimming, cycling).  DIET/WEIGHT Goal is to maintain a healthy weight  Your discharge diet is: Diet heart healthy/carb modified Room service appropriate? Yes; Fluid consistency: Thin  liquids Your height is:  Height: 6\' 3"  (190.5 cm) Your current weight is: Weight: 111 kg (244 lb 11.4 oz) Your Body Mass Index (BMI) is:  BMI (Calculated): 30.59  Following the type of diet specifically designed for you will help prevent another stroke.  Your goal weight range is:  2  Your goal Body Mass Index (BMI) is 19-24.  Healthy food habits can help reduce 3 risk factors for stroke:  High cholesterol, hypertension, and excess weight.  RESOURCES Stroke/Support Group:  Call [phone]   STROKE EDUCATION PROVIDED/REVIEWED AND GIVEN TO PATIENT Stroke warning signs and symptoms How to activate emergency medical system (call 911). Medications prescribed at discharge. Need for follow-up after discharge. Personal risk factors for stroke. Pneumonia vaccine given:  Flu vaccine given:  My questions have been answered, the writing is legible, and I understand these instructions.  I will adhere to these goals & educational materials that have been provided to me after my discharge from the hospital.     My questions have been answered and I understand these instructions. I will adhere to these goals and the provided educational materials after my discharge from the hospital.  Patient/Caregiver Signature _______________________________ Date __________  Clinician Signature _______________________________________ Date __________  Please bring this form and your medication list with you to all your follow-up doctor's appointments.

## 2017-07-14 NOTE — Progress Notes (Signed)
[facility] PHYSICAL MEDICINE & REHABILITATION     PROGRESS NOTE  Subjective/Complaints:  Patient seen sitting up at the edge of the bed this morning. Wife at bedside. Patient states she slept very well overnight. His BP remains elevated.  ROS: Denies CP, SOB, N/V/D.  Objective: Vital Signs: Blood pressure (!) 170/95, pulse 95, temperature 97.8 F (36.6 C), temperature source Oral, resp. rate 16, height 6\' 3"  (1.905 m), weight 111 kg (244 lb 11.4 oz), SpO2 100 %. No results found. Recent Labs    07/12/17 1518  WBC 7.1  HGB 11.8*  HCT 34.3*  PLT 217   Recent Labs    07/12/17 1518 07/14/17 0534  NA 140 140  K 3.4* 3.2*  CL 112* 109  GLUCOSE 134* 87  BUN 30* 30*  CREATININE 2.41* 2.38*  CALCIUM 8.7* 8.7*   CBG (last 3)  Recent Labs    07/13/17 1735 07/13/17 2045 07/14/17 0641  GLUCAP 98 109* 89    Wt Readings from Last 3 Encounters:  07/12/17 111 kg (244 lb 11.4 oz)  07/08/17 111.1 kg (244 lb 14.9 oz)  06/19/17 112 kg (247 lb)    Physical Exam:  BP (!) 170/95 (BP Location: Left Arm)   Pulse 95   Temp 97.8 F (36.6 C) (Oral)   Resp 16   Ht 6\' 3"  (1.905 m)   Wt 111 kg (244 lb 11.4 oz)   SpO2 100%   BMI 30.59 kg/m  Constitutional: He appearswell-developedand well-nourished.  HENT: Normocephalicand atraumatic.  Eyes:EOMare normal. No discharge.  Cardiovascular:RRR. No JVD. Respiratory:Effort normaland breath sounds normal.  GS:UPJSR sounds are normal. There isno tenderness.  Musculoskeletal: He exhibits noedema, no tenderness.  Neurological: He isalertand oriented x3 with cues and additional time, improving. Dysarthria.  Followed all one step commands with delay.  Sensation grossly intact to light touch Motor: RUE: 4+/5 prox to distal.  RLE: 4+/5 proximal to distal (stable) LUE/LLE: 5/5 proximal to distal.  Skin: Skin iswarmand dry.  Psychiatric: His affect isblunt. He isslowed. Cognition and memory  areimpaired.Flat.  Assessment/Plan: 1. Functional deficits secondary to left MCA infarct which require 3+ hours per day of interdisciplinary therapy in a comprehensive inpatient rehab setting. Physiatrist is providing close team supervision and 24 hour management of active medical problems listed below. Physiatrist and rehab team continue to assess barriers to discharge/monitor patient progress toward functional and medical goals.  Function:  Bathing Bathing position   Position: Shower  Bathing parts Body parts bathed by patient: Right arm, Left arm, Chest, Abdomen, Front perineal area, Buttocks, Right upper leg, Left upper leg, Right lower leg, Left lower leg, Back    Bathing assist Assist Level: Set up      Upper Body Dressing/Undressing Upper body dressing   What is the patient wearing?: Pull over shirt/dress     Pull over shirt/dress - Perfomed by patient: Thread/unthread right sleeve, Thread/unthread left sleeve, Put head through opening, Pull shirt over trunk          Upper body assist Assist Level: Set up   Set up : To obtain clothing/put away  Lower Body Dressing/Undressing Lower body dressing   What is the patient wearing?: Pants, Socks, Shoes     Pants- Performed by patient: Thread/unthread right pants leg, Thread/unthread left pants leg, Pull pants up/down   Non-skid slipper socks- Performed by patient: Don/doff right sock, Don/doff left sock   Socks - Performed by patient: Don/doff right sock, Don/doff left sock   Shoes - Performed by  patient: Don/doff right shoe, Don/doff left shoe, Fasten right, Fasten left            Lower body assist Assist for lower body dressing: Supervision or verbal cues      Toileting Toileting          Toileting assist     Transfers Chair/bed transfer   Chair/bed transfer method: Ambulatory Chair/bed transfer assist level: Touching or steadying assistance (Pt > 75%)       Locomotion Ambulation     Max  distance: 150 Assist level: Touching or steadying assistance (Pt > 75%)   Wheelchair          Cognition Comprehension Comprehension assist level: Understands basic 75 - 89% of the time/ requires cueing 10 - 24% of the time  Expression Expression assist level: Expresses basic 75 - 89% of the time/requires cueing 10 - 24% of the time. Needs helper to occlude trach/needs to repeat words.  Social Interaction Social Interaction assist level: Interacts appropriately 75 - 89% of the time - Needs redirection for appropriate language or to initiate interaction.  Problem Solving Problem solving assist level: Solves basic 90% of the time/requires cueing < 10% of the time  Memory Memory assist level: Recognizes or recalls 90% of the time/requires cueing < 10% of the time    Medical Problem List and Plan: 1.Right hemparesis and aphasiasecondary to left MCA infarct   Continue CIR 2. DVT Prophylaxis/Anticoagulation: Pharmaceutical:Lovenox 3. Pain Management:N/A 4. Mood:LCSW to follow for evaluation and support. 5. Neuropsych: This patientis not fullycapable of making decisions on hisown behalf. 6. Skin/Wound Care:Pressure relief measures. Maintain adequate hydration and nutritional status. 7. Fluids/Electrolytes/Nutrition:Monitor I/O.  8. HTN: Monitor BP bid. Used Lotensin bid, Norvasc, lasix and hydralazine tid at home.    Hydralazine increased to 50 on 1/10   Lotensin 20 mg started on 1/9, increased to 40 twice a day on 1/10.    Labetalol 100 twice a day started on 1/11   Remains uncontrolled with readings greater than 200, but overall improving 9. CKD stage III: Followed by Dr. Jimmy Footman.Monitor lytes serially. Resume calcitriol.   Cr 2.38 on 1/11   Encourage fluid intake   Cont to monitor 10. T2DM: Hgb A1C-8.1. Monitor BS ac/hs. Continue lantus    Relatively controlled on 1/11 11. Dyslipidemia: Unable to tolerate statins. Now on Zetia.  12. Left ICA plaque: Follow up with  vascular after discharge. 13. Obesity   Body mass index is 30.59 kg/m.  Diet and exercise education   Encourage weight loss to increase endurance and promote overall health 14. Hypokalemia   Potassium 3.2 on 1/11   Supplemented increased to 20 twice a day 15. ABLA   Hemoglobin 11.8 on 1/9   Continue to monitor  LOS (Days) 3 A FACE TO FACE EVALUATION WAS PERFORMED  Ankit Lorie Phenix 07/14/2017 8:57 AM

## 2017-07-14 NOTE — Significant Event (Signed)
Hypoglycemic Event  CBG: 52  Treatment: 15 GM carbohydrate snack  Symptoms: None  Follow-up CBG: Time:1743 CBG Result:100   Possible Reasons for Event: inadequate meal intake   Comments/MD notified: Reesa Chew notified. Medication doses changed.     Sadia Belfiore S

## 2017-07-14 NOTE — Progress Notes (Signed)
  Speech Language Pathology Daily Session Note  Patient Details  Name: WENDELL NICOSON MRN: [id number] Date of Birth: [birth year]  Today's Date: 07/14/2017 SLP Individual Time: 8786-7672 SLP Individual Time Calculation (min): 55 min  Short Term Goals: Week 1: SLP Short Term Goal 1 (Week 1): STGs=LTGs due to short length of stay   Skilled Therapeutic Interventions:   Pt was seen for skilled ST targeting cognitive-linguistic goals.  Pt was able to name basic, familiar items for 100% accuracy with supervision semantic cues.  Pt needed up to min-mod assist to expand verbal utterances to phrase level during a mildly complex verbal reasoning task due to abstract word finding deficits; however, he only needed min assist during informal conversations.  SLP provided brief education regarding techniques to facilitate expansion of verbal utterances including advocating for himself to get his communication partners to allow him increased time for communication.  Pt was returned to room and left in recliner with call bell within reach.  Continue per current plan of care.    Function:  Eating Eating                 Cognition Comprehension Comprehension assist level: Understands basic 90% of the time/cues < 10% of the time  Expression   Expression assist level: Expresses basic 75 - 89% of the time/requires cueing 10 - 24% of the time. Needs helper to occlude trach/needs to repeat words.  Social Interaction Social Interaction assist level: Interacts appropriately with others with medication or extra time (anti-anxiety, antidepressant).  Problem Solving Problem solving assist level: Solves basic 90% of the time/requires cueing < 10% of the time  Memory Memory assist level: Recognizes or recalls 90% of the time/requires cueing < 10% of the time    Pain Pain Assessment Pain Assessment: No/denies pain  Therapy/Group: Individual Therapy  Dalia Jollie, Selinda Orion 07/14/2017, 11:30 AM

## 2017-07-15 ENCOUNTER — Inpatient Hospital Stay (HOSPITAL_COMMUNITY): Payer: Medicare Other

## 2017-07-15 ENCOUNTER — Inpatient Hospital Stay (HOSPITAL_COMMUNITY): Payer: Medicare Other | Admitting: Occupational Therapy

## 2017-07-15 ENCOUNTER — Inpatient Hospital Stay (HOSPITAL_COMMUNITY): Payer: Medicare Other | Admitting: Speech Pathology

## 2017-07-15 DIAGNOSIS — E162 Hypoglycemia, unspecified: Secondary | ICD-10-CM

## 2017-07-15 DIAGNOSIS — I129 Hypertensive chronic kidney disease with stage 1 through stage 4 chronic kidney disease, or unspecified chronic kidney disease: Secondary | ICD-10-CM

## 2017-07-15 LAB — GLUCOSE, CAPILLARY
GLUCOSE-CAPILLARY: 89 mg/dL (ref 65–99)
Glucose-Capillary: 99 mg/dL (ref 65–99)
Glucose-Capillary: 133 mg/dL — ABNORMAL HIGH (ref 65–99)

## 2017-07-15 NOTE — Progress Notes (Signed)
Occupational Therapy Discharge Summary  Patient Details  Name: [name] [name] MRN: [id number] Date of Birth: [birth year]  Today's Date: 07/15/2017 OT Individual Time: 9702-6378 OT Individual Time Calculation (min): 57 min    1:1. Pt and wife present. Pt gathers all needed items for bathing and dressing at MOD I level. Pt reminded at beginning of session of discussion yesterday to doff/don LB clothing in seated to decrease fall risk. Pt able to carryover this date. Pt stands in shower to complete bathing. Pt grooms in standing at sink. While pt completes ADL, wife discusses bathroom set up and continued reduction f fall risk by adding grab bars, non slip sticker to bottom of tub and obtaining shower seat for energy conservation. Pt completes step over tub transfer with supervision. Pt ambulates throughout session with mod I to gather needed items for changing bed linens. Pt requires min VC for orienting fitted sheets. Pt completes visual scanning R spatial awareness task with 1VC to locate 11/11 colored cones in tx gym. Exited session with pt seated in EOB call light in reach and all needs met  Session 2: 30 min   Pt remains seated throuhgout session participating in game of headbands to work on word finding and divided attention while pts preferred basketball game plays in background. Pt requires min-max assist for word finding and providing this OT with clues to identify words. Exited session with pt seated in recliner with all needs met.   Patient has met 10 of 10 long term goals due to improved activity tolerance, improved balance, postural control, functional use of  RIGHT upper extremity, improved awareness and improved coordination.  Patient to discharge at overall Modified Independent level.  Patient's care partner is independent to provide the necessary physical and cognitive assistance at discharge.    Reasons goals not met: n/a  Recommendation:  Patient will benefit from ongoing  skilled OT services in outpatient setting to continue to advance functional skills in the area of BADL and iADL. Pt would continue to benefit from functional work finding, R attention, anticipatory awareness, coordination and general endurance work  Equipment: No equipment provided  Reasons for discharge: treatment goals met and discharge from hospital  Patient/family agrees with progress made and goals achieved: Yes  OT Discharge Precautions/Restrictions  Precautions Precautions: Fall Restrictions Weight Bearing Restrictions: No General   Vital Signs Therapy Vitals Temp: 98.4 F (36.9 C) Temp Source: Oral Pulse Rate: 72 Resp: 18 BP: 136/75 Patient Position (if appropriate): Orthostatic Vitals Oxygen Therapy SpO2: 100 % O2 Device: Not Delivered Pain Pain Assessment Pain Score: 4 (back) ADL ADL ADL Comments: refer to functional navigator Vision Baseline Vision/History: Wears glasses Wears Glasses: At all times Patient Visual Report: No change from baseline Vision Assessment?: Yes Eye Alignment: Within Functional Limits Ocular Range of Motion: Within Functional Limits Alignment/Gaze Preference: Within Defined Limits Tracking/Visual Pursuits: Able to track stimulus in all quads without difficulty Visual Fields: No apparent deficits Perception  Perception: Impaired Inattention/Neglect: (occasionally requires cueing for R attention to obstacles or scanning for needed items) Praxis Praxis: Intact Cognition Overall Cognitive Status: Impaired/Different from baseline Arousal/Alertness: Awake/alert Orientation Level: Oriented X4 Attention: Selective Sustained Attention: Appears intact Selective Attention: Appears intact Awareness: Impaired Awareness Impairment: Emergent impairment(Supervision) Problem Solving: Impaired Problem Solving Impairment: Functional complex Safety/Judgment: Impaired Sensation Sensation Light Touch: Appears Intact Hot/Cold: Appears  Intact Proprioception: Appears Intact Coordination Gross Motor Movements are Fluid and Coordinated: Yes Fine Motor Movements are Fluid and Coordinated: Yes Motor  Motor Motor - Skilled  Clinical Observations: generalized weakness Mobility  Transfers Transfers: Sit to Stand;Stand to Sit Sit to Stand: 6: Modified independent (Device/Increase time) Stand to Sit: 6: Modified independent (Device/Increase time)  Trunk/Postural Assessment  Cervical Assessment Cervical Assessment: Within Functional Limits Thoracic Assessment Thoracic Assessment: Within Functional Limits Lumbar Assessment Lumbar Assessment: Within Functional Limits Postural Control Righting Reactions: delayed  Balance Balance Balance Assessed: Yes Dynamic Sitting Balance Dynamic Sitting - Level of Assistance: 6: Modified independent (Device/Increase time) Static Standing Balance Static Standing - Level of Assistance: 6: Modified independent (Device/Increase time) Extremity/Trunk Assessment RUE Assessment RUE Assessment: Within Functional Limits LUE Assessment LUE Assessment: Within Functional Limits   See Function Navigator for Current Functional Status.  Lowella Dell Hanford Lust 07/15/2017, 8:57 AM

## 2017-07-15 NOTE — Progress Notes (Signed)
Speech Language Pathology Discharge Summary  Patient Details  Name: [name] [name] MRN: [id number] Date of Birth: 02-Dec-1945  Today's Date: 07/15/2017 SLP Individual Time: 1015-1100 SLP Individual Time Calculation (min): 45 min   Skilled Therapeutic Interventions:  Skilled treatment session focused on cognition goals. SLP facilitated session by providing supervision cues for anticipatory awareness aimed at discharging. Pt able to state that he would benefit from 24 hour supervision d/t decreased communication abilities. He was able to express himself at the sentence to simple conversation level with min dysfluencies.      Patient has met 3 of 3 long term goals.  Patient to discharge at overall Supervision level.   Clinical Impression/Discharge Summary:   Pt has made good progress in ST sessions as evidenced by meeting 3 of 3 LTGs. Pt with increased expressive ability and is able to produce simple sentences and connected them during unstructured conversation about previous known information. Recommend follow OutPatient ST services to target continued dysfluencies at the more complex expressive level as well ad target mild cognitive deficits in complex problem solving, right attention and awareness.  Care Partner:  Caregiver Able to Provide Assistance: Yes  Type of Caregiver Assistance: Cognitive(Communication)  Recommendation:  24 hour supervision/assistance;Outpatient SLP  Rationale for SLP Follow Up: Maximize functional communication;Reduce caregiver burden   Equipment:     Reasons for discharge: Discharged from hospital   Patient/Family Agrees with Progress Made and Goals Achieved: Yes   Function:    Cognition Comprehension Comprehension assist level: Understands basic 90% of the time/cues < 10% of the time;Follows basic conversation/direction with extra time/assistive device  Expression   Expression assist level: Expresses basic 75 - 89% of the time/requires cueing 10 - 24%  of the time. Needs helper to occlude trach/needs to repeat words.  Social Interaction Social Interaction assist level: Interacts appropriately with others with medication or extra time (anti-anxiety, antidepressant).  Problem Solving Problem solving assist level: Solves basic 90% of the time/requires cueing < 10% of the time  Memory Memory assist level: Recognizes or recalls 90% of the time/requires cueing < 10% of the time   [name] 07/15/2017, 10:25 AM

## 2017-07-15 NOTE — Progress Notes (Signed)
Occupational Therapy Session Note  Patient Details  Name: RONNELL CLINGER MRN: [id number] Date of Birth: June 06, 1946  Today's Date: 07/15/2017 OT Individual Time: 1100-1200 OT Individual Time Calculation (min): 60 min    Short Term Goals: Week 1:  OT Short Term Goal 1 (Week 1): STGs = LTGs  Skilled Therapeutic Interventions/Progress Updates:    1:1 Focus on d/c planning, fall recovery and floor transfers.  Also addressed transitional and core stability exercises while on the floor. Pt performed quadruped and then alternating lifting UE/LEs, and long sitting for hamstring stretch. Performed floor transfer with supervision with recommendation for safest method to return to sitting. Pt performed standing balance task on Biodex with and without dynamic standing surface (limits of stability and maze control). Also performed Dynavision- looking at reaction time and ability to alternate attention at a quick speed. Pt with difficulty with t-scope on (reading 4 letter words while hitting targets). Discussed driving recommendations and to follow up with MD.  Performed Nustep for 10 min for cardio endurance on level 5. Pt made mod I in his room.   Therapy Documentation Precautions:  Precautions Precautions: Fall Restrictions Weight Bearing Restrictions: No General:   Vital Signs:   Pain: No c/o pain  See Function Navigator for Current Functional Status.   Therapy/Group: Individual Therapy  Willeen Cass [facility] 07/15/2017, 3:32 PM

## 2017-07-15 NOTE — Progress Notes (Signed)
[place] PHYSICAL MEDICINE & REHABILITATION     PROGRESS NOTE  Subjective/Complaints:  Patient seen lying in bed this morning. Wife at bedside. Patient states he slept well overnight. He denies complaints.   ROS: Denies CP, SOB, N/V/D.  Objective: Vital Signs: Blood pressure 136/75, pulse 72, temperature 98.4 F (36.9 C), temperature source Oral, resp. rate 18, height 6\' 3"  (1.905 m), weight 111 kg (244 lb 11.4 oz), SpO2 100 %. No results found. Recent Labs    07/12/17 1518  WBC 7.1  HGB 11.8*  HCT 34.3*  PLT 217   Recent Labs    07/12/17 1518 07/14/17 0534  NA 140 140  K 3.4* 3.2*  CL 112* 109  GLUCOSE 134* 87  BUN 30* 30*  CREATININE 2.41* 2.38*  CALCIUM 8.7* 8.7*   CBG (last 3)  Recent Labs    07/14/17 1743 07/14/17 2045 07/15/17 0650  GLUCAP 100* 119* 89    Wt Readings from Last 3 Encounters:  07/12/17 111 kg (244 lb 11.4 oz)  07/08/17 111.1 kg (244 lb 14.9 oz)  06/19/17 112 kg (247 lb)    Physical Exam:  BP 136/75 (BP Location: Left Arm)   Pulse 72   Temp 98.4 F (36.9 C) (Oral)   Resp 18   Ht 6\' 3"  (1.905 m)   Wt 111 kg (244 lb 11.4 oz)   SpO2 100%   BMI 30.59 kg/m  Constitutional: He appearswell-developedand well-nourished.  HENT: Normocephalicand atraumatic.  Eyes:EOMare normal. No discharge.  Cardiovascular:RRR. No JVD. Respiratory:Effort normal and breath sounds normal.  UE:AVWUJ sounds are normal. There isno tenderness.  Musculoskeletal: He exhibits noedema, no tenderness.  Neurological: He isalertand oriented.  Dysarthria.  Followed all one step commands with delay, improving.  Motor: RUE: 4+/5 prox to distal.  RLE: 4+/5 proximal to distal (unchanged) Skin: Skin iswarmand dry.  Psychiatric: His affect isblunt. He isslowed. Cognition and memory areimpaired.Flat.  Assessment/Plan: 1. Functional deficits secondary to left MCA infarct which require 3+ hours per day of interdisciplinary therapy in a comprehensive  inpatient rehab setting. Physiatrist is providing close team supervision and 24 hour management of active medical problems listed below. Physiatrist and rehab team continue to assess barriers to discharge/monitor patient progress toward functional and medical goals.  Function:  Bathing Bathing position   Position: Shower  Bathing parts Body parts bathed by patient: Right arm, Left arm, Chest, Abdomen, Front perineal area, Buttocks, Right upper leg, Left upper leg, Right lower leg, Left lower leg, Back    Bathing assist Assist Level: Set up      Upper Body Dressing/Undressing Upper body dressing   What is the patient wearing?: Pull over shirt/dress     Pull over shirt/dress - Perfomed by patient: Thread/unthread right sleeve, Thread/unthread left sleeve, Put head through opening, Pull shirt over trunk          Upper body assist Assist Level: Set up   Set up : To obtain clothing/put away  Lower Body Dressing/Undressing Lower body dressing   What is the patient wearing?: Pants, Socks, Shoes     Pants- Performed by patient: Thread/unthread right pants leg, Thread/unthread left pants leg, Pull pants up/down   Non-skid slipper socks- Performed by patient: Don/doff right sock, Don/doff left sock   Socks - Performed by patient: Don/doff right sock, Don/doff left sock   Shoes - Performed by patient: Don/doff right shoe, Don/doff left shoe, Fasten right, Fasten left            Lower body  assist Assist for lower body dressing: Supervision or verbal cues      Toileting Toileting   Toileting steps completed by patient: Adjust clothing prior to toileting, Performs perineal hygiene, Adjust clothing after toileting   Toileting Assistive Devices: Grab bar or rail  Toileting assist Assist level: No help/no cues   Transfers Chair/bed transfer   Chair/bed transfer method: Ambulatory Chair/bed transfer assist level: Touching or steadying assistance (Pt > 75%)        Locomotion Ambulation     Max distance: 150 Assist level: Supervision or verbal cues   Wheelchair          Cognition Comprehension Comprehension assist level: Understands basic 90% of the time/cues < 10% of the time, Follows basic conversation/direction with extra time/assistive device  Expression Expression assist level: Expresses basic 75 - 89% of the time/requires cueing 10 - 24% of the time. Needs helper to occlude trach/needs to repeat words.  Social Interaction Social Interaction assist level: Interacts appropriately with others with medication or extra time (anti-anxiety, antidepressant).  Problem Solving Problem solving assist level: Solves basic 90% of the time/requires cueing < 10% of the time  Memory Memory assist level: Recognizes or recalls 90% of the time/requires cueing < 10% of the time    Medical Problem List and Plan: 1.Right hemparesis and aphasiasecondary to left MCA infarct   Continue CIR 2. DVT Prophylaxis/Anticoagulation: Pharmaceutical:Lovenox 3. Pain Management:N/A 4. Mood:LCSW to follow for evaluation and support. 5. Neuropsych: This patientis ?not fullycapable of making decisions on hisown behalf. 6. Skin/Wound Care:Pressure relief measures. Maintain adequate hydration and nutritional status. 7. Fluids/Electrolytes/Nutrition:Monitor I/O.  8. HTN: Monitor BP bid. Used Lotensin bid, Norvasc, lasix and hydralazine tid at home.    Hydralazine increased to 50 on 1/10   Lotensin 20 mg started on 1/9, increased to 40 twice a day on 1/10.    Labetalol 100 twice a day started on 1/11   Improving control 9. CKD stage III: Followed by Dr. Jimmy Footman.Monitor lytes serially. Resume calcitriol.   Cr 2.38 on 1/11   Encourage fluid intake   Cont to monitor 10. T2DM: Hgb A1C-8.1. Monitor BS ac/hs. Continue lantus    Hypoglycemia, Starlix decreased to 60 on 1/11 11. Dyslipidemia: Unable to tolerate statins. Now on Zetia.  12. Left ICA plaque: Follow  up with vascular after discharge. 13. Obesity   Body mass index is 30.59 kg/m.  Diet and exercise education   Encourage weight loss to increase endurance and promote overall health 14. Hypokalemia   Potassium 3.2 on 1/11   Supplemented increased to 20 twice a day   Labs ordered for tomorrow 15. ABLA   Hemoglobin 11.8 on 1/9   Continue to monitor  LOS (Days) 4 A FACE TO FACE EVALUATION WAS PERFORMED  Muna Demers Lorie Phenix 07/15/2017 12:26 PM

## 2017-07-16 LAB — GLUCOSE, CAPILLARY: Glucose-Capillary: 111 mg/dL — ABNORMAL HIGH (ref 65–99)

## 2017-07-16 LAB — BASIC METABOLIC PANEL
ANION GAP: 8 (ref 5–15)
BUN: 30 mg/dL — ABNORMAL HIGH (ref 6–20)
CALCIUM: 8.7 mg/dL — AB (ref 8.9–10.3)
CO2: 21 mmol/L — AB (ref 22–32)
Chloride: 112 mmol/L — ABNORMAL HIGH (ref 101–111)
Creatinine, Ser: 2.5 mg/dL — ABNORMAL HIGH (ref 0.61–1.24)
GFR, EST AFRICAN AMERICAN: 28 mL/min — AB (ref >60)
GFR, EST NON AFRICAN AMERICAN: 25 mL/min — AB (ref >60)
GLUCOSE: 162 mg/dL — AB (ref 65–99)
POTASSIUM: 3.6 mmol/L (ref 3.5–5.1)
Sodium: 141 mmol/L (ref 135–145)

## 2017-07-16 NOTE — Progress Notes (Signed)
[place] PHYSICAL MEDICINE & REHABILITATION     PROGRESS NOTE  Subjective/Complaints:  Patient seen lying in bed this morning. He states he slept well overnight. He is ready for discharge today.  ROS: Denies CP, SOB, N/V/D.  Objective: Vital Signs: Blood pressure (!) 123/97, pulse 66, temperature 98.4 F (36.9 C), temperature source Oral, resp. rate 18, height 6\' 3"  (1.905 m), weight 111 kg (244 lb 11.4 oz), SpO2 98 %. No results found. No results for input(s): WBC, HGB, HCT, PLT in the last 72 hours. Recent Labs    07/14/17 0534  NA 140  K 3.2*  CL 109  GLUCOSE 87  BUN 30*  CREATININE 2.38*  CALCIUM 8.7*   CBG (last 3)  Recent Labs    07/15/17 1707 07/15/17 2124 07/16/17 0643  GLUCAP 99 133* 111*    Wt Readings from Last 3 Encounters:  07/12/17 111 kg (244 lb 11.4 oz)  07/08/17 111.1 kg (244 lb 14.9 oz)  06/19/17 112 kg (247 lb)    Physical Exam:  BP (!) 123/97 (BP Location: Left Arm)   Pulse 66   Temp 98.4 F (36.9 C) (Oral)   Resp 18   Ht 6\' 3"  (1.905 m)   Wt 111 kg (244 lb 11.4 oz)   SpO2 98%   BMI 30.59 kg/m  Constitutional: He appearswell-developedand well-nourished.  HENT: Normocephalicand atraumatic.  Eyes:EOMare normal. No discharge.  Cardiovascular:RRR. No JVD. Respiratory:Effort normal and breath sounds normal.  XF:GHWEX sounds are normal. There isno tenderness.  Musculoskeletal: He exhibits noedema, no tenderness.  Neurological: He isalertand oriented.  Dysarthria.  Followed all one step commands with delay, improving.  Motor: RUE: 4+/5 prox to distal.  RLE: 4+/5 proximal to distal (stable) Skin: Skin iswarmand dry.  Psychiatric: His affect isblunt. He isslowed. Cognition and memory areimpaired.Flat.  Assessment/Plan: 1. Functional deficits secondary to left MCA infarct which require 3+ hours per day of interdisciplinary therapy in a comprehensive inpatient rehab setting. Physiatrist is providing close team  supervision and 24 hour management of active medical problems listed below. Physiatrist and rehab team continue to assess barriers to discharge/monitor patient progress toward functional and medical goals.  Function:  Bathing Bathing position   Position: Shower  Bathing parts Body parts bathed by patient: Right arm, Left arm, Chest, Abdomen, Front perineal area, Buttocks, Right upper leg, Left upper leg, Right lower leg, Left lower leg, Back    Bathing assist Assist Level: More than reasonable time      Upper Body Dressing/Undressing Upper body dressing   What is the patient wearing?: Pull over shirt/dress     Pull over shirt/dress - Perfomed by patient: Thread/unthread right sleeve, Thread/unthread left sleeve, Put head through opening, Pull shirt over trunk          Upper body assist Assist Level: More than reasonable time   Set up : To obtain clothing/put away  Lower Body Dressing/Undressing Lower body dressing   What is the patient wearing?: Pants, Socks, Shoes     Pants- Performed by patient: Thread/unthread right pants leg, Thread/unthread left pants leg, Pull pants up/down   Non-skid slipper socks- Performed by patient: Don/doff right sock, Don/doff left sock   Socks - Performed by patient: Don/doff right sock, Don/doff left sock   Shoes - Performed by patient: Don/doff right shoe, Don/doff left shoe, Fasten right, Fasten left            Lower body assist Assist for lower body dressing: More than reasonable time  Toileting Toileting   Toileting steps completed by patient: Adjust clothing prior to toileting, Performs perineal hygiene, Adjust clothing after toileting   Toileting Assistive Devices: Grab bar or rail  Toileting assist Assist level: No help/no cues   Transfers Chair/bed transfer   Chair/bed transfer method: Ambulatory Chair/bed transfer assist level: Touching or steadying assistance (Pt > 75%)       Locomotion Ambulation     Max  distance: 150 Assist level: Supervision or verbal cues   Wheelchair          Cognition Comprehension Comprehension assist level: Understands basic 75 - 89% of the time/ requires cueing 10 - 24% of the time  Expression Expression assist level: Expresses basic 50 - 74% of the time/requires cueing 25 - 49% of the time. Needs to repeat parts of sentences.  Social Interaction Social Interaction assist level: Interacts appropriately 75 - 89% of the time - Needs redirection for appropriate language or to initiate interaction.  Problem Solving Problem solving assist level: Solves basic 50 - 74% of the time/requires cueing 25 - 49% of the time  Memory Memory assist level: Recognizes or recalls 90% of the time/requires cueing < 10% of the time    Medical Problem List and Plan: 1.Right hemparesis and aphasiasecondary to left MCA infarct   D/c today   Will see patient for transitional care management in 1-2 weeks 2. DVT Prophylaxis/Anticoagulation: Pharmaceutical:Lovenox 3. Pain Management:N/A 4. Mood:LCSW to follow for evaluation and support. 5. Neuropsych: This patientis ?not fullycapable of making decisions on hisown behalf. 6. Skin/Wound Care:Pressure relief measures. Maintain adequate hydration and nutritional status. 7. Fluids/Electrolytes/Nutrition:Monitor I/O.  8. HTN: Monitor BP bid. Used Lotensin bid, Norvasc, lasix and hydralazine tid at home.    Hydralazine increased to 50 on 1/10   Lotensin 20 mg started on 1/9, increased to 40 twice a day on 1/10.    Labetalol 100 twice a day started on 1/11   Continues to improve on 1/13 9. CKD stage III: Followed by Dr. Jimmy Footman.Monitor lytes serially. Resume calcitriol.   Cr 2.38 on 1/11   Encourage fluid intake   Labs pending for today   Cont to monitor 10. T2DM: Hgb A1C-8.1. Monitor BS ac/hs. Continue lantus    Hypoglycemia, Starlix decreased to 60 on 1/11    Relatively controlled on 1/13 11. Dyslipidemia: Unable to  tolerate statins. Now on Zetia.  12. Left ICA plaque: Follow up with vascular after discharge. 13. Obesity   Body mass index is 30.59 kg/m.  Diet and exercise education   Encourage weight loss to increase endurance and promote overall health 14. Hypokalemia   Potassium 3.2 on 1/11   Supplemented increased to 20 twice a day   Labs pending 15. ABLA   Hemoglobin 11.8 on 1/9   Continue to monitor  LOS (Days) 5 A FACE TO FACE EVALUATION WAS PERFORMED  Ankit Lorie Phenix 07/16/2017 8:46 AM

## 2017-07-16 NOTE — Progress Notes (Signed)
Pt d/c home with pt belongings. Went over loop recorder travel instructions as well as reconnection once home is reached. D/C instructions provided to pt and family with verbal understanding of medications and follow ups.

## 2017-07-17 ENCOUNTER — Encounter: Payer: Self-pay | Admitting: Occupational Therapy

## 2017-07-17 ENCOUNTER — Ambulatory Visit: Payer: Medicare Other

## 2017-07-17 ENCOUNTER — Ambulatory Visit: Payer: Medicare Other | Attending: Physical Medicine and Rehabilitation | Admitting: Occupational Therapy

## 2017-07-17 ENCOUNTER — Telehealth: Payer: Self-pay | Admitting: Family Medicine

## 2017-07-17 DIAGNOSIS — R4701 Aphasia: Secondary | ICD-10-CM | POA: Insufficient documentation

## 2017-07-17 DIAGNOSIS — R41840 Attention and concentration deficit: Secondary | ICD-10-CM | POA: Insufficient documentation

## 2017-07-17 DIAGNOSIS — R2689 Other abnormalities of gait and mobility: Secondary | ICD-10-CM | POA: Diagnosis present

## 2017-07-17 DIAGNOSIS — R414 Neurologic neglect syndrome: Secondary | ICD-10-CM | POA: Diagnosis present

## 2017-07-17 DIAGNOSIS — R41842 Visuospatial deficit: Secondary | ICD-10-CM | POA: Diagnosis present

## 2017-07-17 DIAGNOSIS — R2681 Unsteadiness on feet: Secondary | ICD-10-CM | POA: Insufficient documentation

## 2017-07-17 DIAGNOSIS — R41841 Cognitive communication deficit: Secondary | ICD-10-CM | POA: Diagnosis present

## 2017-07-17 DIAGNOSIS — I69318 Other symptoms and signs involving cognitive functions following cerebral infarction: Secondary | ICD-10-CM | POA: Diagnosis not present

## 2017-07-17 DIAGNOSIS — R41844 Frontal lobe and executive function deficit: Secondary | ICD-10-CM | POA: Diagnosis present

## 2017-07-17 NOTE — Therapy (Signed)
[facility] [address] [place], [place], [address] Phone: [phone]   Fax:  [phone]  Occupational Therapy Evaluation  Patient Details  Name: [name] [name] MRN: [id number] Date of Birth: 07-18-1945 Referring Provider: Ivan Anchors. Love, PA-C   Encounter Date: 07/17/2017  OT End of Session - 07/17/17 1514    Visit Number  1    Number of Visits  17    Date for OT Re-Evaluation  09/15/17    Authorization Type  [employer] Medicare    Authorization Time Period  cert. date 07/17/17-09/15/17    OT Start Time  1318    OT Stop Time  1402    OT Time Calculation (min)  44 min    Activity Tolerance  Patient tolerated treatment well    Behavior During Therapy  Flat affect       Past Medical History:  Diagnosis Date  . DIABETES MELLITUS, TYPE II 01/02/2007  . HYPERLIPIDEMIA 06/18/2007  . HYPERTENSION 01/02/2007  . RENAL INSUFFICIENCY 02/05/2008    Past Surgical History:  Procedure Laterality Date  . LOOP RECORDER INSERTION N/A 07/11/2017   Procedure: LOOP RECORDER INSERTION;  Surgeon: Thompson Grayer, MD;  Location: [facility];  Service: Cardiovascular;  Laterality: N/A;  . SHOULDER SURGERY     left  . TEE WITHOUT CARDIOVERSION N/A 07/11/2017   Procedure: TRANSESOPHAGEAL ECHOCARDIOGRAM (TEE);  Surgeon: Dorothy Spark, MD;  Location: [facility];  Service: Cardiovascular;  Laterality: N/A;  . TRANSURETHRAL RESECTION OF PROSTATE     history of retention/hematuria     There were no vitals filed for this visit.  Subjective Assessment - 07/17/17 1322    Patient is accompained by:  Family member wife    Pertinent History  L middle cerebral artery stroke; DM, hyperlipidemia, renal insufficiency, HTN, hx L shoulder surgery, loop recorder    Patient Stated Goals  improve speech, improve R side attention (needed prompting)    Currently in Pain?  No/denies        [facility] OT Assessment - 07/17/17 0001      Assessment   Medical Diagnosis  L middle cerebral artery stroke    Referring Provider  Ivan Anchors. Love, PA-C    Onset Date/Surgical Date  07/08/17    Hand Dominance  Right    Prior Therapy  CIR, hospitalization 07/08/17-07/16/17      Precautions   Precaution Comments  no driving       Balance Screen   Has the patient fallen in the past 6 months  No      Home  Environment   Family/patient expects to be discharged to:  Private residence    Lives With  [place]  Retired    Leisure  [employer], Administrator, arts, Haematologist, Architect, going to football games      ADL   Eating/Feeding  Modified independent    Grooming  -- hasn't tried shaving    Upper Body Bathing  Modified independent    Lower Body Bathing  Modified independent    Upper Body Dressing  -- mod i    Lower Body Dressing  Modified independent    Toilet Transfer  Modified independent    Toileting - Clothing Manipulation  Modified independent    [place] Transfer  Modified independent    Transfers/Ambulation Related  to ADL's  independent      IADL   Prior Level of Function Shopping  wife performed    Prior Level of Function Light Housekeeping  did yardwork prior; wife performed inside clean    Prior Level of Function Meal Prep  wife performs, pt grilled previously    Prior Level of Function Sales executive  Relies on family or friends for transportation    Prior Level of Function Medication Managment  independent    Medication Management  -- wife assisting due to med changes     Prior Level of Function Financial Management  wife performs      Mobility   Mobility Status  Independent      Written Expression   Dominant Hand  Right    Handwriting  100% legible      Vision - History   Baseline Vision  Bifocals due to get vision evaluated prior to CVA    Additional  Comments  pt denies difficulty      Vision Assessment   Vision Assessment  Vision impaired  _ to be further tested in functional context    Comment  1.68M visual scanning/number cancellation sheet with approx 97% accuracy, but signicant incr time.  Simple environmental scanning with approx 50% accuracy with most items missed on R side.      Cognition   Overall Cognitive Status  Impaired/Different from baseline    Area of Impairment  Attention;Problem solving    Current Attention Level  Selective    Awareness  Impaired    Awareness Impairment  Emergent impairment    Executive Function  Sequencing;Organizing;Reasoning    Cognition Comments  incr processing speed, apasia.  Trail making test part B with significant difficulty/errors with sequencing, omission, particularly towards end of tasks (may be due to decr attention)      Sensation   Additional Comments  denies numbness/tingling      Coordination   9 Hole Peg Test  Right;Left    Right 9 Hole Peg Test  33.25    Left 9 Hole Peg Test  34.22    Coordination  noted tremor L hand      ROM / Strength   AROM / PROM / Strength  AROM;Strength      AROM   Overall AROM   Within functional limits for tasks performed    Overall AROM Comments  BUEs proximal strength grossly 5/5      Strength   Overall Strength  Within functional limits for tasks performed    Overall Strength Comments  BUEs      Hand Function   Right Hand Grip (lbs)  120    Left Hand Grip (lbs)  120                      OT Education - 07/17/17 1504    Education provided  Yes    Education Details  OT eval results/POC    Person(s) Educated  Patient;Spouse    Methods  Explanation    Comprehension  Verbalized understanding       OT Short Term Goals - 07/17/17 1521      OT SHORT TERM GOAL #1   Title  Pt will be independent with HEP for cognitive/visual deficits.--check STGs 08/18/17    Time  4    Period  Weeks    Status  New      OT SHORT TERM GOAL  #2  Title  Pt will perform complex tabletop visual scanning with at least 95% accuracy.    Time  4    Period  Weeks    Status  New      OT SHORT TERM GOAL #3   Title  Pt will attend to functional task for at least 50min in busy environment.    Time  4    Period  Weeks    Status  New      OT SHORT TERM GOAL #4   Title  Pt will perform mod complex organization tasks with at least 75% accuracy.    Time  4    Period  Weeks    Status  New        OT Long Term Goals - 07/17/17 1523      OT LONG TERM GOAL #1   Title  Pt will perform mod complex environmental scanning with at least 90% accuracy.    Time  8    Period  Weeks    Status  New      OT LONG TERM GOAL #2   Title  Pt will perform mod complex organization tasks with at least 90% accuracy.    Time  8    Period  Weeks    Status  New      OT LONG TERM GOAL #3   Title  Pt will divide attention between cognitive and physical task with at least 85% accuracy for improved safety with community activities/in prep for driving.    Time  8    Period  Weeks    Status  New            Plan - 07/17/17 1514    Clinical Impression Statement  Pt is a 72 y.o. male s/p L middle cerebral artery stroke (hospitalized 1/5-13/19).  Pt with PMH that includes:  DM, hyperlipidemia, renal insufficiency, HTN, hx L shoulder surgery, loop recorder.  Pt presents with cognitive deficits, visual perceptual deficits, R inattention.  Pt also with aphasia.  Pt will benefit from occupational therapy to address these deficits for improved ADL/IADL performance and safety.      Occupational Profile and client history currently impacting functional performance  Pt was independent and driving prior to CVA.  Pt is currently unable to drive and has not yet returned to previous exercise or yard activities.  Pt is also limited for previous volunteer activities.      Occupational performance deficits (Please refer to evaluation for details):   ADL's;IADL's;Leisure;Social Participation    Rehab Potential  Good    OT Frequency  2x / week    OT Duration  8 weeks +eval    OT Treatment/Interventions  Self-care/ADL training;Moist Heat;DME and/or AE instruction;Therapeutic activities;Cognitive remediation/compensation;Therapeutic exercise;Neuromuscular education;Cryotherapy;Functional Mobility Training;Passive range of motion;Visual/perceptual remediation/compensation;Patient/family education;Energy conservation;Manual Therapy    Plan  further assess vision prn; cognitive/visual HEP    Clinical Decision Making  Limited treatment options, no task modification necessary    Recommended Other Services  schedule for PT eval; current with ST    Consulted and Agree with Plan of Care  Patient;Family member/caregiver       Patient will benefit from skilled therapeutic intervention in order to improve the following deficits and impairments:  Decreased cognition, Impaired vision/preception, Decreased coordination, Impaired perceived functional ability, Decreased safety awareness, Decreased knowledge of precautions(R inattention)  Visit Diagnosis: Other symptoms and signs involving cognitive functions following cerebral infarction  Attention and concentration deficit  Frontal lobe and executive function  deficit  Visuospatial deficit  Neurologic neglect syndrome    Problem List Patient Active Problem List   Diagnosis Date Noted  . Hypoglycemia   . Uncontrolled hypertension   . Hypertensive urgency   . Hypertensive crisis   . Acute blood loss anemia   . Hypokalemia   . Acute ischemic left MCA stroke ([place]) 07/12/2017  . Diabetes mellitus type 2 in obese ([place])   . Stage 3 chronic kidney disease ([place])   . Benign essential HTN   . Left middle cerebral artery stroke ([place]) 07/09/2017  . Acute embolic stroke ([place])   . Aphasia 07/08/2017  . Gout 07/21/2011  . Prostatitis, chronic 07/14/2010  . RENAL INSUFFICIENCY 02/05/2008  .  Hyperlipidemia 06/18/2007  . DM type 2 (diabetes mellitus, type 2) ([place]) 01/02/2007  . Hypertensive kidney disease 01/02/2007    [facility] 07/17/2017, 3:30 PM  [facility] [address] [place], [place], [address] Phone: [phone]   Fax:  [phone]  Name: MIQUEL STACKS MRN: [id number] Date of Birth: [birth year]   Vianne Bulls, OTR/L [facility] [address] [place], [place]  [address] [phone] phone [phone] 07/17/17 3:30 PM

## 2017-07-17 NOTE — Therapy (Signed)
[facility] [address] [place], [place], [address] Phone: [phone]   Fax:  [phone]  Speech Language Pathology Evaluation  Patient Details  Name: [name] [name] MRN: [id number] Date of Birth: [birth year] Referring Provider: Jamse Arn, MD   Encounter Date: 07/17/2017  End of Session - 07/17/17 1459    Visit Number  1    Number of Visits  17    Date for SLP Re-Evaluation  09/22/17    SLP Start Time  1404    SLP Stop Time   1445    SLP Time Calculation (min)  41 min    Activity Tolerance  Patient tolerated treatment well       Past Medical History:  Diagnosis Date  . DIABETES MELLITUS, TYPE II 01/02/2007  . HYPERLIPIDEMIA 06/18/2007  . HYPERTENSION 01/02/2007  . RENAL INSUFFICIENCY 02/05/2008    Past Surgical History:  Procedure Laterality Date  . LOOP RECORDER INSERTION N/A 07/11/2017   Procedure: LOOP RECORDER INSERTION;  Surgeon: Thompson Grayer, MD;  Location: [facility];  Service: Cardiovascular;  Laterality: N/A;  . SHOULDER SURGERY     left  . TEE WITHOUT CARDIOVERSION N/A 07/11/2017   Procedure: TRANSESOPHAGEAL ECHOCARDIOGRAM (TEE);  Surgeon: Dorothy Spark, MD;  Location: [facility] ENDOSCOPY;  Service: Cardiovascular;  Laterality: N/A;  . TRANSURETHRAL RESECTION OF PROSTATE     history of retention/hematuria     There were no vitals filed for this visit.  Subjective Assessment - 07/17/17 1425    Subjective  Pt with rt inattention reported by OT.    Currently in Pain?  No/denies         SLP Evaluation [facility] - 07/17/17 1425      SLP Visit Information   SLP Received On  07/17/17    Referring Provider  Jamse Arn, MD    Onset Date  07-08-17    Medical Diagnosis  CVA      General Information   HPI  [name] Wortley Bordersis a 72 y.o.malewith medical history significant ofdiabetes mellitus type 2, CKD stage III, hypertension, hyperlipidemia was brought to the hospital for evaluation  of confusion and expressive aphasia. MRI showed left MCA infarct. Pt rec'd ST on acute and also on CIR, with noted expressive aphasia and some cognitive linguistic impairments.       Prior Functional Status   Cognitive/Linguistic Baseline  Within functional limits    Type of Home  House     Lives With  Spouse      Cognition   Overall Cognitive Status  Impaired/Different from baseline    Area of Impairment  Attention      Auditory Comprehension   Overall Auditory Comprehension  Impaired    Conversation  Moderately complex    Other Conversation Comments  slow response time to verbal stimuli at times during eval      Standardized Assessments   Standardized Assessments   [place] Naming Test-2nd edition    [place] Naming Test-2nd edition   Pt scored 41/60, more than two standard deviations below the mean (mean=71)                      SLP Education - 07/17/17 1457    Education provided  Yes    Education Details  ST eval results, need for cognitive-linguistic testing, phonemic cues helpful for anomia    Person(s) Educated  Patient;Spouse    Methods  Explanation    Comprehension  Verbalized  understanding       SLP Short Term Goals - 2017/07/20 1506      SLP SHORT TERM GOAL #1   Title  pt will demonstrate word finding compensations in 7 minutes simple conversation with rare min A for their use    Time  4    Period  Weeks    Status  New      SLP SHORT TERM GOAL #2   Title  pt will undergo cognitvie linguistic testing and goals to be added PRN    Time  2    Period  Weeks    Status  New      SLP SHORT TERM GOAL #3   Title  pt will achieve 85% success with simple word finding tasks over 3 sessions    Time  4    Period  Weeks    Status  New      SLP SHORT TERM GOAL #4   Title  pt will demo understanding of mod complex directions 85% success over three sessions    Time  4    Period  Weeks    Status  New       SLP Long Term Goals - 2017-07-20 1507      SLP  LONG TERM GOAL #1   Title  pt will functionally use compensatory strategies for anomia in 8 minutes mod complex conversation    Time  8 or 17 visits, for all LTGs    Period  Weeks    Status  New      SLP LONG TERM GOAL #2   Title  pt will complete mod complex naming tasks with 90% success with modified independence (use of compensations)    Time  8    Period  Weeks    Status  New      SLP LONG TERM GOAL #3   Title  pt will demo functional comprehension in 10 minutes mod complex-complex convresation over three sessions    Time  8    Period  Weeks    Status  New       Plan - 2017/07/20 1459    Clinical Impression Statement  Pt presents today with anomia measured by [facility] Naming Test - 2 (BNT-2) as more than two standard deviations BELOW the mean. Semantic paraphasias were observed mostly during BNT-2 (e.g. "harpsicord" for "accordian", "preamble" for "scroll", "squid" for "octopus". Practically, pt agrees he has difficulty with finding words, and pt observed with vague speech and pausing/hesitation during simple conversation. Simple conversation was functional with min-mod SLP questioning cues. SLP questions some degree of cognitive linguistic impairment as pt had severe difficulty with serial subtraction of 7s and mod to severe difficulty with serial subtraction of 3s. Furhter testing is necessary. Pt would benefit from skilled ST to address expressive language and cognitive linguistic deficits.    Speech Therapy Frequency  2x / week    Duration  -- 8 weeks or 17 total sessions    Treatment/Interventions  Language facilitation;Environmental controls;Cueing hierarchy;SLP instruction and feedback;Compensatory strategies;Patient/family education;Multimodal communcation approach;Internal/external aids;Cognitive reorganization;Functional tasks    Potential to Achieve Goals  Good    Consulted and Agree with Plan of Care  Patient;Family member/caregiver wife       Patient will benefit from  skilled therapeutic intervention in order to improve the following deficits and impairments:   Aphasia  Cognitive communication deficit  G-Codes - 07-20-17 1507/09/28    Functional Assessment Tool Used  noms, clinical judgment  Functional Limitations  Spoken language expressive    Spoken Language Expression Current Status [phone])  At least 40 percent but less than 60 percent impaired, limited or restricted    Spoken Language Expression Goal Status (A1655)  At least 1 percent but less than 20 percent impaired, limited or restricted       Problem List Patient Active Problem List   Diagnosis Date Noted  . Hypoglycemia   . Uncontrolled hypertension   . Hypertensive urgency   . Hypertensive crisis   . Acute blood loss anemia   . Hypokalemia   . Acute ischemic left MCA stroke ([place]) 07/12/2017  . Diabetes mellitus type 2 in obese ([place])   . Stage 3 chronic kidney disease ([place])   . Benign essential HTN   . Left middle cerebral artery stroke ([place]) 07/09/2017  . Acute embolic stroke ([place])   . Aphasia 07/08/2017  . Gout 07/21/2011  . Prostatitis, chronic 07/14/2010  . RENAL INSUFFICIENCY 02/05/2008  . Hyperlipidemia 06/18/2007  . DM type 2 (diabetes mellitus, type 2) ([place]) 01/02/2007  . Hypertensive kidney disease 01/02/2007    [facility], [place]  07/17/2017, 5:09 PM  [facility] [address] [place], [place], [address] Phone: [phone]   Fax:  [phone]  Name: RAIMUNDO CORBIT MRN: [id number] Date of Birth: May 12, 1946

## 2017-07-17 NOTE — Telephone Encounter (Signed)
I tried to reach patient for TCM call, no answer or option to leave a message, will try again later.

## 2017-07-17 NOTE — Progress Notes (Signed)
Physical Therapy Discharge Summary  Patient Details  Name: [name] MRN: [id number] Date of Birth: 1945/10/17     Patient has met 6 of 6 long term goals due to improved activity tolerance, improved balance, increased strength and ability to compensate for deficits.  Patient to discharge at an ambulatory level Modified Independent.     Reasons goals not met: n/a  Recommendation:  Patient will benefit from ongoing skilled PT services in outpatient setting to continue to advance safe functional mobility, address ongoing impairments in balance, gait, and minimize fall risk.  Equipment: No equipment provided  Reasons for discharge: treatment goals met and discharge from hospital  Patient/family agrees with progress made and goals achieved: Yes    See Function Navigator for Current Functional Status.  Imri Lor 07/17/2017, 11:51 AM

## 2017-07-17 NOTE — Patient Instructions (Addendum)
  We will work on your word finding and your thinking skills. I will see you twice a week.   You may see also another speech therapist depending on what days you can return for therapy.

## 2017-07-18 NOTE — Telephone Encounter (Signed)
Transition Care Management Follow-up Telephone Call  [name] [name] MRN: [id number] DOB/AGE: 03/20/1946 72 y.o.  Admit date: 07/11/2017 Discharge date: 07/16/2017  Discharge Diagnoses:  Principal Problem:   Acute ischemic left MCA stroke [facility]) Active Problems:   Diabetes mellitus type 2 in obese [facility])   Stage 3 chronic kidney disease ([place])   Acute blood loss anemia   Hypokalemia   Uncontrolled hypertension   Hypertensive urgency   Hypoglycemia   Discharged Condition: stable.     How have you been since you were released from the hospital? "doing well"   Do you understand why you were in the hospital? yes   Do you understand the discharge instructions? yes   Where were you discharged to? Home   Items Reviewed:  Medications reviewed: yes  Allergies reviewed: yes  Dietary changes reviewed: yes  Referrals reviewed: yes   Functional Questionnaire:   Activities of Daily Living (ADLs):   He states they are independent in the following: ambulation, bathing and hygiene, feeding, continence, grooming, toileting and dressing States they require assistance with the following: none   Any transportation issues/concerns?: no   Any patient concerns? no   Confirmed importance and date/time of follow-up visits scheduled yes  Provider Appointment booked with Dorothyann Peng on 07/25/2017 Tuesday at 1:30 pm  Confirmed with patient if condition begins to worsen call PCP or go to the ER.  Patient was given the office number and encouraged to call back with question or concerns.  : yes

## 2017-07-19 ENCOUNTER — Ambulatory Visit: Payer: Medicare Other | Admitting: Occupational Therapy

## 2017-07-19 ENCOUNTER — Encounter: Payer: Self-pay | Admitting: Rehabilitative and Restorative Service Providers"

## 2017-07-19 ENCOUNTER — Ambulatory Visit: Payer: Medicare Other | Admitting: Rehabilitative and Restorative Service Providers"

## 2017-07-19 VITALS — BP 135/78

## 2017-07-19 DIAGNOSIS — I69318 Other symptoms and signs involving cognitive functions following cerebral infarction: Secondary | ICD-10-CM

## 2017-07-19 DIAGNOSIS — R41844 Frontal lobe and executive function deficit: Secondary | ICD-10-CM

## 2017-07-19 DIAGNOSIS — R2689 Other abnormalities of gait and mobility: Secondary | ICD-10-CM

## 2017-07-19 DIAGNOSIS — R2681 Unsteadiness on feet: Secondary | ICD-10-CM

## 2017-07-19 DIAGNOSIS — R41842 Visuospatial deficit: Secondary | ICD-10-CM

## 2017-07-19 DIAGNOSIS — R41840 Attention and concentration deficit: Secondary | ICD-10-CM

## 2017-07-19 NOTE — Therapy (Signed)
[facility] [address] [place], [place], [address] Phone: [phone]   Fax:  [phone]  Physical Therapy Evaluation  Patient Details  Name: [name] [name] MRN: [id number] Date of Birth: 1946/03/25 Referring Provider: Reesa Chew, PA-C (f/u with Dr. Posey Pronto)   Encounter Date: 07/19/2017  PT End of Session - 07/19/17 1039    Visit Number  1    Number of Visits  6    Date for PT Re-Evaluation  09/02/17    Authorization Type  [employer] medicare $40 copay for PT and ST combined    PT Start Time  0850    PT Stop Time  0929    PT Time Calculation (min)  39 min    Activity Tolerance  Patient tolerated treatment well    Behavior During Therapy  [facility] for tasks assessed/performed minimal verbal interaction due to speech deficits       Past Medical History:  Diagnosis Date  . DIABETES MELLITUS, TYPE II 01/02/2007  . HYPERLIPIDEMIA 06/18/2007  . HYPERTENSION 01/02/2007  . RENAL INSUFFICIENCY 02/05/2008    Past Surgical History:  Procedure Laterality Date  . LOOP RECORDER INSERTION N/A 07/11/2017   Procedure: LOOP RECORDER INSERTION;  Surgeon: Thompson Grayer, MD;  Location: [facility];  Service: Cardiovascular;  Laterality: N/A;  . SHOULDER SURGERY     left  . TEE WITHOUT CARDIOVERSION N/A 07/11/2017   Procedure: TRANSESOPHAGEAL ECHOCARDIOGRAM (TEE);  Surgeon: Dorothy Spark, MD;  Location: [facility] ENDOSCOPY;  Service: Cardiovascular;  Laterality: N/A;  . TRANSURETHRAL RESECTION OF PROSTATE     history of retention/hematuria     There were no vitals filed for this visit.   Subjective Assessment - 07/19/17 0854    Subjective  The patient is s/p L MCA on 07/08/2017.  He underwent IP rehab and d/c home 07/16/17.  He presents for PT evaluation today noting main deficit is speech.  He does not use an assistive device for gait and notes no falls since d/c.     Pertinent History  diabetes, renal insufficiency, HTN, h/o L shoulder  surgery     Patient Stated Goals  Unsure- hard to vocalize due to speech difficulties.     Currently in Pain?  No/denies         [facility] PT Assessment - 07/19/17 2836      Assessment   Medical Diagnosis  L middle cerebral artery stroke    Referring Provider  Reesa Chew, PA-C (f/u with Dr. Posey Pronto)    Onset Date/Surgical Date  07/08/17    Hand Dominance  Right    Prior Therapy  CIR, hospitalization      Precautions   Precaution Comments  no driving      Balance Screen   Has the patient fallen in the past 6 months  No    Has the patient had a decrease in activity level because of a fear of falling?   No    Is the patient reluctant to leave their home because of a fear of falling?   No      Home Environment   Living Environment  Private residence    Living Arrangements  Spouse/significant other    Type of [place] to enter    Entrance Stairs-Number of Steps  4    Entrance Stairs-Rails  Can reach both    [place]  One level    [place]  None  Prior Function   Level of Independence  Independent    Vocation  Retired    Lawyer, works out 3 days/week, Psychologist, sport and exercise, retired as Airline pilot, Special educational needs teacher   Overall Cognitive Status  Impaired/Different from baseline      Observation/Other Assessments   Focus on Therapeutic Outcomes (FOTO)   66%      Sensation   Light Touch  Appears Intact      Coordination   Gross Motor Movements are Fluid and Coordinated  Yes    Fine Motor Movements are Fluid and Coordinated  -- slowed speed on finger opposition and RAM      ROM / Strength   AROM / PROM / Strength  AROM;Strength      AROM   Overall AROM   Within functional limits for tasks performed      Strength   Overall Strength  Within functional limits for tasks performed      Standardized Balance Assessment   Standardized Balance Assessment  Berg Balance Test      Berg Balance Test   Sit to Stand  Able to stand  without using hands and stabilize independently    Standing Unsupported  Able to stand safely 2 minutes    Sitting with Back Unsupported but Feet Supported on Floor or Stool  Able to sit safely and securely 2 minutes    Stand to Sit  Sits safely with minimal use of hands    Transfers  Able to transfer safely, minor use of hands    Standing Unsupported with Eyes Closed  Able to stand 10 seconds safely    Standing Ubsupported with Feet Together  Able to place feet together independently and stand 1 minute safely    From Standing, Reach Forward with Outstretched Arm  Can reach confidently >25 cm (10")    From Standing Position, Pick up Object from Floor  Able to pick up shoe safely and easily    From Standing Position, Turn to Look Behind Over each Shoulder  Looks behind from both sides and weight shifts well    Turn 360 Degrees  Able to turn 360 degrees safely but slowly    Standing Unsupported, Alternately Place Feet on Step/Stool  Able to complete 4 steps without aid or supervision    Standing Unsupported, One Foot in Front  Able to plae foot ahead of the other independently and hold 30 seconds    Standing on One Leg  Able to lift leg independently and hold equal to or more than 3 seconds    Total Score  49    Berg comment:  49/56      Functional Gait  Assessment   Gait assessed   Yes    Gait Level Surface  Walks 20 ft in less than 5.5 sec, no assistive devices, good speed, no evidence for imbalance, normal gait pattern, deviates no more than 6 in outside of the 12 in walkway width.    Change in Gait Speed  Able to smoothly change walking speed without loss of balance or gait deviation. Deviate no more than 6 in outside of the 12 in walkway width.    Gait with Horizontal Head Turns  Performs head turns smoothly with slight change in gait velocity (eg, minor disruption to smooth gait path), deviates 6-10 in outside 12 in walkway width, or uses an assistive device.    Gait with Vertical Head  Turns  Performs task with slight change  in gait velocity (eg, minor disruption to smooth gait path), deviates 6 - 10 in outside 12 in walkway width or uses assistive device    Gait and Pivot Turn  Pivot turns safely within 3 sec and stops quickly with no loss of balance.    Step Over Obstacle  Is able to step over 2 stacked shoe boxes taped together (9 in total height) without changing gait speed. No evidence of imbalance.    Gait with Narrow Base of Support  Ambulates 4-7 steps.    Gait with Eyes Closed  Walks 20 ft, slow speed, abnormal gait pattern, evidence for imbalance, deviates 10-15 in outside 12 in walkway width. Requires more than 9 sec to ambulate 20 ft.    Ambulating Backwards  Walks 20 ft, slow speed, abnormal gait pattern, evidence for imbalance, deviates 10-15 in outside 12 in walkway width.    Steps  Alternating feet, must use rail.    Total Score  21    FGA comment:  21/30             Objective measurements completed on examination: See above findings.      [facility] Adult PT Treatment/Exercise - 07/19/17 0927      Ambulation/Gait   Ambulation/Gait  Yes    Ambulation/Gait Assistance  7: Independent    Ambulation Distance (Feet)  300 Feet    Assistive device  None    Gait Pattern  -- walks close to surfaces on the right; normal pace     Ambulation Surface  Level;Indoor    Gait velocity  3.5 ft/sec    Stairs  Yes    Stairs Assistance  6: Modified independent (Device/Increase time)    Stair Management Technique  Two rails;Alternating pattern    Ramp  7: Independent    Curb  7: Independent      Posture/Postural Control   Posture/Postural Control  No significant limitations      Neuro Re-ed    Neuro Re-ed Details   single limb stance, tandem stance near support surface      Exercises   Exercises  Other Exercises    Other Exercises   sit<>stand x 10 without UE support             PT Education - 07/19/17 1039    Education provided  Yes    Education  Details  sit<>stand, tandem stance, unilateral stance    Person(s) Educated  Patient;Spouse    Methods  Explanation;Demonstration;Handout    Comprehension  Verbalized understanding;Returned demonstration       PT Short Term Goals - 07/19/17 1044      PT SHORT TERM GOAL #1   Title  The patient will return demo HEP for high level balance and dynamic gait activities.    Time  4    Period  Weeks    Target Date  08/18/17      PT SHORT TERM GOAL #2   Title  The patient will improve FGA from 21/30 to > or equal to 24/30 to demonstrate improved dynamic gait abilities for community mobility.    Time  4    Period  Weeks    Target Date  08/18/17      PT SHORT TERM GOAL #3   Title  The patient will return to modified [facility] routine [facility] [employer] (initially recommending walking on indoor track) to include cardio, strengthening.    Time  4    Period  Weeks    Target  Date  08/18/17      PT SHORT TERM GOAL #4   Title  --        PT Long Term Goals - 07/19/17 1046      PT LONG TERM GOAL #1   Title  The patient will be independent with post d/c wellness program.    Time  6    Period  Weeks    Target Date  09/02/17      PT LONG TERM GOAL #2   Title  The patient will improve FGA from 21/30 to > or equal to 26/30 to demonstrate improving dynamic mobility.    Time  6    Period  Weeks    Target Date  09/02/17      PT LONG TERM GOAL #3   Title  The patient will improve Berg from 49/56 to > or equal to 53/56 to demonstrate improved narrow base of support balance/    Time  6    Period  Weeks    Target Date  09/02/17             Plan - 07/19/17 1048    Clinical Impression Statement  The patient is a 72 yo male s/p L MCA stroke on 07/08/2017 presenting to OP rehab with decreased high level balance, decreased dynamic gait and reports of fatigue.  The patient will benefit from PT services to address impairments and help return to community exercise program.    History and Personal Factors  relevant to plan of care:  diabetes, HTN, recent stroke, fatigue    Clinical Presentation  Stable    Clinical Presentation due to:  independent ambulation in home    Clinical Decision Making  Low    Rehab Potential  Good    PT Frequency  1x / week    PT Duration  6 weeks    PT Treatment/Interventions  ADLs/Self Care Home Management;Balance training;Neuromuscular re-education;Patient/family education;Gait training;Functional mobility training;Therapeutic activities;Therapeutic exercise    PT Next Visit Plan  check HEP, write down formal walking program; further assess R inattention with gait in busy environments    Consulted and Agree with Plan of Care  Patient;Family member/caregiver    Family Member Consulted  spouse       Patient will benefit from skilled therapeutic intervention in order to improve the following deficits and impairments:  Decreased activity tolerance, Decreased balance, Difficulty walking  Visit Diagnosis: Other abnormalities of gait and mobility  Unsteadiness on feet     Problem List Patient Active Problem List   Diagnosis Date Noted  . Hypoglycemia   . Uncontrolled hypertension   . Hypertensive urgency   . Hypertensive crisis   . Acute blood loss anemia   . Hypokalemia   . Acute ischemic left MCA stroke ([place]) 07/12/2017  . Diabetes mellitus type 2 in obese ([place])   . Stage 3 chronic kidney disease ([place])   . Benign essential HTN   . Left middle cerebral artery stroke ([place]) 07/09/2017  . Acute embolic stroke ([place])   . Aphasia 07/08/2017  . Gout 07/21/2011  . Prostatitis, chronic 07/14/2010  . RENAL INSUFFICIENCY 02/05/2008  . Hyperlipidemia 06/18/2007  . DM type 2 (diabetes mellitus, type 2) ([place]) 01/02/2007  . Hypertensive kidney disease 01/02/2007    Katilyn Miltenberger, PT 07/19/2017, 10:58 AM  [facility] [address] [place], [place], [address] Phone: [phone]   Fax:   [phone]  Name: [name] [name] MRN: [id number] Date of  Birth: 01/07/1946

## 2017-07-19 NOTE — Therapy (Signed)
[facility] [address] [place], [place], [address] Phone: [phone]   Fax:  [phone]  Occupational Therapy Treatment  Patient Details  Name: [name] [name] MRN: [id number] Date of Birth: 17-Sep-1945 Referring Provider: Ivan Anchors. Love, PA-C   Encounter Date: 07/19/2017  OT End of Session - 07/19/17 0820    Visit Number  2    Number of Visits  17    Date for OT Re-Evaluation  09/15/17    Authorization Type  UHC Medicare    OT Start Time  0813    OT Stop Time  0851    OT Time Calculation (min)  38 min    Activity Tolerance  Patient tolerated treatment well    Behavior During Therapy  Flat affect       Past Medical History:  Diagnosis Date  . DIABETES MELLITUS, TYPE II 01/02/2007  . HYPERLIPIDEMIA 06/18/2007  . HYPERTENSION 01/02/2007  . RENAL INSUFFICIENCY 02/05/2008    Past Surgical History:  Procedure Laterality Date  . LOOP RECORDER INSERTION N/A 07/11/2017   Procedure: LOOP RECORDER INSERTION;  Surgeon: Thompson Grayer, MD;  Location: [facility];  Service: Cardiovascular;  Laterality: N/A;  . SHOULDER SURGERY     left  . TEE WITHOUT CARDIOVERSION N/A 07/11/2017   Procedure: TRANSESOPHAGEAL ECHOCARDIOGRAM (TEE);  Surgeon: Dorothy Spark, MD;  Location: [facility] ENDOSCOPY;  Service: Cardiovascular;  Laterality: N/A;  . TRANSURETHRAL RESECTION OF PROSTATE     history of retention/hematuria     Vitals:   07/19/17 0816  BP: 135/78    Subjective Assessment - 07/19/17 0816    Pertinent History  L middle cerebral artery stroke; DM, hyperlipidemia, renal insufficiency, HTN, hx L shoulder surgery, loop recorder    Patient Stated Goals  improve speech, improve R side attention (needed prompting)    Currently in Pain?  No/denies         treatment: Copying small peg design with increased time and 100% accuracy in a minimally distracting environments. Pt's peripheral visual field appear intact per gross  confrontation testing. Mod complex organization task, max difficulty. therapist had to assist with writing out activities and then have pt sequence, pt still needed mod-max v.c to complete and he did not fully sequence correctly. Pt was also noted to hold pencil too far back, when cued to hold pencil closer to the tip pt demonstrates improved legibility.                    OT Short Term Goals - 07/17/17 1521      OT SHORT TERM GOAL #1   Title  Pt will be independent with HEP for cognitive/visual deficits.--check STGs 08/18/17    Time  4    Period  Weeks    Status  New      OT SHORT TERM GOAL #2   Title  Pt will perform complex tabletop visual scanning with at least 95% accuracy.    Time  4    Period  Weeks    Status  New      OT SHORT TERM GOAL #3   Title  Pt will attend to functional task for at least 66min in busy environment.    Time  4    Period  Weeks    Status  New      OT SHORT TERM GOAL #4   Title  Pt will perform mod complex organization tasks with at least 75% accuracy.  Time  4    Period  Weeks    Status  New        OT Long Term Goals - 07/17/17 1523      OT LONG TERM GOAL #1   Title  Pt will perform mod complex environmental scanning with at least 90% accuracy.    Time  8    Period  Weeks    Status  New      OT LONG TERM GOAL #2   Title  Pt will perform mod complex organization tasks with at least 90% accuracy.    Time  8    Period  Weeks    Status  New      OT LONG TERM GOAL #3   Title  Pt will divide attention between cognitive and physical task with at least 85% accuracy for improved safety with community activities/in prep for driving.    Time  8    Period  Weeks    Status  New            Plan - 07/19/17 2353    Clinical Impression Statement  Pt is progressing towards goals. Pt performed well on copying small peg design however pt demonstrates max difficulty with mod complex organiztion task.    Rehab Potential  Good     OT Frequency  2x / week    OT Duration  8 weeks    OT Treatment/Interventions  Self-care/ADL training;Moist Heat;DME and/or AE instruction;Therapeutic activities;Cognitive remediation/compensation;Therapeutic exercise;Neuromuscular education;Cryotherapy;Functional Mobility Training;Passive range of motion;Visual/perceptual remediation/compensation;Patient/family education;Energy conservation;Manual Therapy    Plan  cognitive/ visual HEP prn, simple organization tasks    Consulted and Agree with Plan of Care  Patient;Family member/caregiver       Patient will benefit from skilled therapeutic intervention in order to improve the following deficits and impairments:  Decreased cognition, Impaired vision/preception, Decreased coordination, Impaired perceived functional ability, Decreased safety awareness, Decreased knowledge of precautions  Visit Diagnosis: Other symptoms and signs involving cognitive functions following cerebral infarction  Attention and concentration deficit  Frontal lobe and executive function deficit  Visuospatial deficit    Problem List Patient Active Problem List   Diagnosis Date Noted  . Hypoglycemia   . Uncontrolled hypertension   . Hypertensive urgency   . Hypertensive crisis   . Acute blood loss anemia   . Hypokalemia   . Acute ischemic left MCA stroke ([place]) 07/12/2017  . Diabetes mellitus type 2 in obese ([place])   . Stage 3 chronic kidney disease ([place])   . Benign essential HTN   . Left middle cerebral artery stroke ([place]) 07/09/2017  . Acute embolic stroke ([place])   . Aphasia 07/08/2017  . Gout 07/21/2011  . Prostatitis, chronic 07/14/2010  . RENAL INSUFFICIENCY 02/05/2008  . Hyperlipidemia 06/18/2007  . DM type 2 (diabetes mellitus, type 2) ([place]) 01/02/2007  . Hypertensive kidney disease 01/02/2007    Zaylin Pistilli 07/19/2017, 8:57 AM  [facility] [address] [place], [place],  [address] Phone: [phone]   Fax:  [phone]  Name: DAHMIR EPPERLY MRN: [id number] Date of Birth: 03-Feb-1946

## 2017-07-19 NOTE — Patient Instructions (Signed)
Functional Quadriceps: Sit to Stand    Sit on edge of chair, feet flat on floor. Stand upright, extending knees fully. Repeat _10___ times per set. Do __2__ sets per session. Do __1-2__ sessions per day.  http://orth.exer.us/734   Copyright  VHI. All rights reserved.    SINGLE LIMB STANCE    Stance: single leg on floor. Raise leg. Hold _10__ seconds. Repeat with other leg. _3__ reps per set, __1-2_ sets per day.  Copyright  VHI. All rights reserved.    Feet Heel-Toe "Tandem", Varied Arm Positions - Eyes Open    With eyes open, right foot directly in front of the other, arms out, look straight ahead at a stationary object. Hold __30__ seconds. Repeat _3___ times each leg forward per session. Do _1-2___ sessions per day.  Copyright  VHI. All rights reserved.

## 2017-07-21 ENCOUNTER — Other Ambulatory Visit: Payer: Self-pay | Admitting: Adult Health

## 2017-07-21 DIAGNOSIS — E119 Type 2 diabetes mellitus without complications: Secondary | ICD-10-CM

## 2017-07-21 MED ORDER — GLUCOSE BLOOD VI STRP: 1.0000 | ORAL_STRIP | Freq: Every day | 11 refills | Status: DC

## 2017-07-21 NOTE — Telephone Encounter (Signed)
Copied from [place] [id number]. Topic: Quick Communication - Rx Refill/Question >> Jul 21, 2017 10:13 AM Percell Belt A wrote: Medication: glucose blood (ONETOUCH VERIO) test strip [[id number]]   Has the patient contacted their pharmacy?    (Agent: If no, request that the patient contact the pharmacy for the refill.)   Preferred Pharmacy (with phone number or street name): wife called in and and needs test strips sent to [facility] on [address] and pt has an appt on Tuesday   Agent: Please be advised that RX refills may take up to 3 business days. We ask that you follow-up with your pharmacy.

## 2017-07-21 NOTE — Telephone Encounter (Signed)
Medication filled to pharmacy as requested.   

## 2017-07-21 NOTE — Telephone Encounter (Signed)
Med list has 2014 date on strips- ? New Rx for provider.

## 2017-07-24 ENCOUNTER — Other Ambulatory Visit: Payer: Self-pay

## 2017-07-24 NOTE — Patient Outreach (Signed)
[facility]) Care Management  07/24/2017  Dominyck Reser [place] 03/30/1946 [id number]     EMMI-STROKE RED ON EMMI ALERT Day # 6 Date: 07/23/17 Red Alert Reason: "Questions/problems with meds? Yes  Smoked or been around smoke? Yes"    Outreach attempt # 1 to patient. No answer at present and unable to leave voicemail message.         Plan: RN CM will make outreach attempt to patient within one business day.    [name], RN,BSN,CCM [facility] Telephonic Care Management Coordinator Direct Phone: [phone] Toll Free: [phone] Fax: [phone]

## 2017-07-25 ENCOUNTER — Other Ambulatory Visit: Payer: Self-pay

## 2017-07-25 ENCOUNTER — Encounter: Payer: Self-pay | Admitting: Adult Health

## 2017-07-25 ENCOUNTER — Ambulatory Visit: Payer: Medicare Other

## 2017-07-25 ENCOUNTER — Ambulatory Visit (INDEPENDENT_AMBULATORY_CARE_PROVIDER_SITE_OTHER): Payer: Medicare Other | Admitting: Adult Health

## 2017-07-25 VITALS — BP 150/86 | Temp 98.9°F | Wt 234.0 lb

## 2017-07-25 DIAGNOSIS — I69321 Dysphasia following cerebral infarction: Secondary | ICD-10-CM | POA: Diagnosis not present

## 2017-07-25 DIAGNOSIS — I10 Essential (primary) hypertension: Secondary | ICD-10-CM | POA: Diagnosis not present

## 2017-07-25 DIAGNOSIS — I639 Cerebral infarction, unspecified: Secondary | ICD-10-CM

## 2017-07-25 DIAGNOSIS — E876 Hypokalemia: Secondary | ICD-10-CM | POA: Diagnosis not present

## 2017-07-25 DIAGNOSIS — E669 Obesity, unspecified: Secondary | ICD-10-CM | POA: Diagnosis not present

## 2017-07-25 DIAGNOSIS — E1169 Type 2 diabetes mellitus with other specified complication: Secondary | ICD-10-CM | POA: Diagnosis not present

## 2017-07-25 LAB — CBC WITH DIFFERENTIAL/PLATELET
BASOS ABS: 0.1 10*3/uL (ref 0.0–0.1)
BASOS PCT: 0.9 % (ref 0.0–3.0)
EOS ABS: 0.3 10*3/uL (ref 0.0–0.7)
Eosinophils Relative: 4.4 % (ref 0.0–5.0)
HEMATOCRIT: 35.2 % — AB (ref 39.0–52.0)
HEMOGLOBIN: 11.7 g/dL — AB (ref 13.0–17.0)
LYMPHS PCT: 26.8 % (ref 12.0–46.0)
Lymphs Abs: 1.8 10*3/uL (ref 0.7–4.0)
MCHC: 33.3 g/dL (ref 30.0–36.0)
MCV: 82 fl (ref 78.0–100.0)
Monocytes Absolute: 0.3 10*3/uL (ref 0.1–1.0)
Monocytes Relative: 4.9 % (ref 3.0–12.0)
Neutro Abs: 4.1 10*3/uL (ref 1.4–7.7)
Neutrophils Relative %: 63 % (ref 43.0–77.0)
Platelets: 337 10*3/uL (ref 150.0–400.0)
RBC: 4.3 Mil/uL (ref 4.22–5.81)
RDW: 14.4 % (ref 11.5–15.5)
WBC: 6.6 10*3/uL (ref 4.0–10.5)

## 2017-07-25 LAB — BASIC METABOLIC PANEL
BUN: 39 mg/dL — AB (ref 6–23)
CHLORIDE: 107 meq/L (ref 96–112)
CO2: 24 meq/L (ref 19–32)
Calcium: 8.9 mg/dL (ref 8.4–10.5)
Creatinine, Ser: 2.39 mg/dL — ABNORMAL HIGH (ref 0.40–1.50)
GFR: 34.69 mL/min — ABNORMAL LOW (ref >60.00)
GLUCOSE: 147 mg/dL — AB (ref 70–99)
POTASSIUM: 4.4 meq/L (ref 3.5–5.1)
Sodium: 139 mEq/L (ref 135–145)

## 2017-07-25 MED ORDER — INSULIN GLARGINE 100 UNIT/ML SOLOSTAR PEN
18.0000 [IU] | PEN_INJECTOR | Freq: Every day | SUBCUTANEOUS | 6 refills | Status: DC
Start: 2017-07-25 — End: 2017-07-27

## 2017-07-25 NOTE — Progress Notes (Signed)
Subjective:    Patient ID: [name] [name], male    DOB: [birth year], 72 y.o.   MRN: [id number]  HPI 72 year old male who  has a past medical history of DIABETES MELLITUS, TYPE II (01/02/2007), HYPERLIPIDEMIA (06/18/2007), HYPERTENSION (01/02/2007), and RENAL INSUFFICIENCY (02/05/2008).   He presents to the office today for TCM visit his wife is with him at this visit.   He was admitted on 07/11/2017  He was discharged on 07/16/2017   Principal Diagnosis: Acute ischemic left MCA stroke.   He presented to the emergency room after waking up and had difficulty speaking and mild right-sided weakness.  In the ER his blood pressure was elevated.  He had a CT of the head which was negative for for hemorrhage.  CTA of the brain showed lipid-laden plaques extending almost 2 cm into the left ICA this was concerning for recurrent/distal emboli.  MRI of the brain showed multifocal areas of restricted diffusion to the left MCA territory-largest in the left frontal cortex and subcortical white matter which was felt to be embolic.   While in the hospital he had a loop recorder placed during his workup  He was admitted to rehab on 07/11/2017 for inpatient therapies that consisted of PT, OT, ST.   His blood pressure while in the hospital continued to be labile.  Hydralazine was increased to 50 mg 3 times daily as he was not compliant with Lasix at home.  Labetalol was resumed at 100 mg twice daily and he had improvement in his blood pressure.  Starlix was resumed for tighter blood sugar control but was decreased to 60 mg 3 times daily before meals  As his treatment progressed he had improvement in verbal output but continued to require occasional cues for left inattention.   He is currently receiving outpatient PT, OT, ST at [facility] neuro rehab.  Reports that he is doing home exercises regularly  Today in the office he and his wife both report that he is doing well at home monitoring his blood sugars and blood  pressures at home and brought the log with him today per his log his blood pressures have slowly decreased since increasing hydralazine.  Per the log his highest was 207/68 when he first came home and since the increase in medication the lowest has been 120/73.  Most of his blood pressure readings have been in the 400Q-676P systolic range and his blood sugars have also been well controlled since being home this morning his blood sugar was 84 and this afternoon his blood sugar was 111.  Wife reports that he continues to have issues with word finding difficulties and holding a conversation with a higher level of thinking.  Wife reports that she will often have to help him choose the correct words.  He denies any issues with right-sided weakness, denies walking with a limp, has not experienced any headaches, lightheadedness, or dizziness.   His wife reports that she has been keeping him busy throughout the day.  For exercise will often go to a local grocery store and walk around mother buying fruits and vegetables.  He is taking naps sporadically throughout the day.  An upcoming appointment later this week with Dr. Posey Pronto at physical medicine and rehabilitation.  Also has an appointment with neurology in mid March, and will be following up with nephrology and cardiology as well.   Review of Systems   See HPI    Past Medical History:  Diagnosis Date  . DIABETES  MELLITUS, TYPE II 01/02/2007  . HYPERLIPIDEMIA 06/18/2007  . HYPERTENSION 01/02/2007  . RENAL INSUFFICIENCY 02/05/2008    Social History   Socioeconomic History  . Marital status: Married    Spouse name: Not on file  . Number of children: Not on file  . Years of education: Not on file  . Highest education level: Not on file  Social Needs  . Financial resource strain: Not on file  . Food insecurity - worry: Not on file  . Food insecurity - inability: Not on file  . Transportation needs - medical: Not on file  . Transportation needs -  non-medical: Not on file  Occupational History  . Not on file  Tobacco Use  . Smoking status: Never Smoker  . Smokeless tobacco: Never Used  Substance and Sexual Activity  . Alcohol use: No  . Drug use: No  . Sexual activity: Not on file  Other Topics Concern  . 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 Likes to go to football games.     Past Surgical History:  Procedure Laterality Date  . LOOP RECORDER INSERTION N/A 07/11/2017   Procedure: LOOP RECORDER INSERTION;  Surgeon: Thompson Grayer, MD;  Location: [facility];  Service: Cardiovascular;  Laterality: N/A;  . SHOULDER SURGERY     left  . TEE WITHOUT CARDIOVERSION N/A 07/11/2017   Procedure: TRANSESOPHAGEAL ECHOCARDIOGRAM (TEE);  Surgeon: Dorothy Spark, MD;  Location: [facility] ENDOSCOPY;  Service: Cardiovascular;  Laterality: N/A;  . TRANSURETHRAL RESECTION OF PROSTATE     history of retention/hematuria     Family History  Problem Relation Age of Onset  . Hypertension Mother   . Diabetes Mother   . Stroke Father   . Stroke Maternal Grandmother     Allergies  Allergen Reactions  . Bactrim [Sulfamethoxazole-Trimethoprim] Hives, Itching and Swelling  . Penicillins     Has patient had a PCN reaction causing immediate rash, facial/tongue/throat swelling, SOB or lightheadedness with hypotension: Unk Has patient had a PCN reaction causing severe rash involving mucus membranes or skin necrosis: Unk Has patient had a PCN reaction that required hospitalization: Unk Has patient had a PCN reaction occurring within the last 10 years: No If all of the above answers are "NO", then may proceed with Cephalosporin use.  . Statins     myalgia  . Sulfa Drugs Cross Reactors Hives, Itching and Swelling    Current Outpatient Medications on File Prior to Visit  Medication Sig Dispense  Refill  . aspirin 325 MG EC tablet Take 1 tablet (325 mg total) by mouth daily. 30 tablet 0  . benazepril (LOTENSIN) 40 MG tablet Take 1 tablet (40 mg total) by mouth 2 (two) times daily. 60 tablet 0  . calcitRIOL (ROCALTROL) 0.25 MCG capsule Take 1 capsule by mouth daily.    Marland Kitchen ezetimibe (ZETIA) 10 MG tablet Take 1 tablet (10 mg total) by mouth daily. 30 tablet 1  . glucose blood (ONETOUCH VERIO) test strip 1 each by Other route daily. Use as instructed 100 each 11  . hydrALAZINE (APRESOLINE) 50 MG tablet Take 1 tablet (50 mg total) by mouth 3 (three) times daily. 90 tablet 0  . Insulin Glargine (LANTUS SOLOSTAR) 100 UNIT/ML Solostar Pen INJECT 20 UNITS SUBCUTANEOUSLY ONCE DAILY. NEED TO MAKE  APPOINTMENT. 2 pen 0  . labetalol (NORMODYNE) 200 MG tablet Take 0.5 tablets (100 mg total) by mouth 2 (two) times daily. 120 tablet 0  . nateglinide (STARLIX) 60 MG tablet Take 1 tablet (60 mg total) by mouth 3 (three) times daily with meals. 90 tablet 0  . potassium chloride SA (K-DUR,KLOR-CON) 20 MEQ tablet Take 1 tablet (20 mEq total) by mouth daily. 30 tablet 0   No current facility-administered medications on file prior to visit.     BP (!) 150/86 (BP Location: Left Arm)   Temp 98.9 F (37.2 C) (Oral)   Wt 234 lb (106.1 kg)   BMI 29.25 kg/m       Objective:   Physical Exam  Constitutional: He is oriented to person, place, and time. He appears well-developed and well-nourished. No distress.  Eyes: Conjunctivae and EOM are normal. Pupils are equal, round, and reactive to light. Right eye exhibits no discharge. Left eye exhibits no discharge. No scleral icterus.  Neck: Normal range of motion. Neck supple.  Cardiovascular: Normal rate, regular rhythm, normal heart sounds and intact distal pulses. Exam reveals no friction rub.  No murmur heard. Pulmonary/Chest: Effort normal and breath sounds normal. No respiratory distress. He has no wheezes. He has no rales. He exhibits no tenderness.    Abdominal: Soft. Bowel sounds are normal. He exhibits no distension and no mass. There is no tenderness. There is no rebound and no guarding.  Musculoskeletal: Normal range of motion. He exhibits no edema, tenderness or deformity.  Neurological: He is alert and oriented to person, place, and time. He has normal strength and normal reflexes. He displays no tremor and normal reflexes. No cranial nerve deficit or sensory deficit. He exhibits normal muscle tone. Coordination and gait normal.  Normal finger to nose exam 5/5 grip strength  5/5 strength in bilateral upper and lower extremities    Skin: Skin is warm and dry. No rash noted. He is not diaphoretic. No erythema. No pallor.  Psychiatric: He has a normal mood and affect. His behavior is normal. Judgment and thought content normal.  Nursing note and vitals reviewed.     Assessment & Plan:  1. Acute embolic stroke ([place]) -Encourage continue exercise at home and can to continue with physical therapy, OT, ST.  Continue with new heart healthy diet.   - Insulin Glargine (LANTUS SOLOSTAR) 100 UNIT/ML Solostar Pen; Inject 18 Units into the skin daily at 10 pm. INJECT 20 UNITS SUBCUTANEOUSLY ONCE DAILY. NEED TO MAKE APPOINTMENT.  Dispense: 2 pen; Refill: 6 - CBC with Differential/Platelet - Basic Metabolic Panel  2. Diabetes mellitus type 2 in obese (HCC)  - Insulin Glargine (LANTUS SOLOSTAR) 100 UNIT/ML Solostar Pen; Inject 18 Units into the skin daily at 10 pm. INJECT 20 UNITS SUBCUTANEOUSLY ONCE DAILY. NEED TO MAKE APPOINTMENT.  Dispense: 2 pen; Refill: 6 - Basic Metabolic Panel  3. Uncontrolled hypertension -Per his log his blood pressure has been much better controlled since the hospital visit.  Advised family to continue to monitor blood pressure at home on a regular basis and follow-up with either myself or nephrology if blood pressures consistently above 150/90 - CBC with Differential/Platelet - Basic Metabolic Panel  4.  Hypokalemia  - Basic Metabolic Panel   Dorothyann Peng, NP

## 2017-07-25 NOTE — Patient Outreach (Signed)
[name]) Care Management  07/25/2017  [name] 1945-09-02 [id number]   EMMI-STROKE RED ON EMMI ALERT Day # 6 Date: 07/23/17 Red Alert Reason: "Questions/problems with meds? Yes  Smoked or been around smoke? Yes"    Outreach attempt #2 to patient. Patient not available at present. Spoke with spouse(ROI on file). Spouse has bene assisting patient with answering automated calls. She voices that patient answered the call on his own on 07/23/17 that triggered red alert. She states that they did have an issue with patient's test strips. They could not locate the bottle of strips and there were no refills on it. They were able to get the issue worked out and resolved with no problem. Spouse seems very knowledgeable  And supportive in helping manage patient's care. He goes for PCP f/u appt later today. She has her list of questions to address during appt. Patient ws started on new BP med and she has noticed that he is sleeping a little more than normal. She also as questions about his Lantus dosage. Spouse has been keeping a log of BP and cbg readings and will take to appt today. Spouse voices that paitent does not smoke nor she or anyone else around him and response was recorded in error. No further RN CM needs or concerns at this time. Advised spouse that they would continue to get automated EMMI-Stroke post discharge calls to assess how they are doing following recent hospitalization and will receive a call from a nurse if any of their responses were abnormal. She voiced understanding and was appreciative of f/u call.      Plan: RN CM will notify [facility] administrative assistant of case status.    [name], RN,BSN,CCM [facility] Telephonic Care Management Coordinator Direct Phone: [phone] Toll Free: [phone] Fax: [phone]

## 2017-07-27 ENCOUNTER — Encounter: Payer: Self-pay | Admitting: Family Medicine

## 2017-07-27 ENCOUNTER — Encounter: Payer: Medicare Other | Attending: Physical Medicine & Rehabilitation | Admitting: Physical Medicine & Rehabilitation

## 2017-07-27 ENCOUNTER — Encounter: Payer: Self-pay | Admitting: Physical Therapy

## 2017-07-27 ENCOUNTER — Other Ambulatory Visit: Payer: Self-pay

## 2017-07-27 ENCOUNTER — Ambulatory Visit: Payer: Medicare Other | Admitting: Physical Therapy

## 2017-07-27 ENCOUNTER — Ambulatory Visit: Payer: Medicare Other | Admitting: Speech Pathology

## 2017-07-27 ENCOUNTER — Ambulatory Visit: Payer: Medicare Other | Admitting: Occupational Therapy

## 2017-07-27 ENCOUNTER — Telehealth: Payer: Self-pay | Admitting: Adult Health

## 2017-07-27 ENCOUNTER — Encounter: Payer: Self-pay | Admitting: Physical Medicine & Rehabilitation

## 2017-07-27 VITALS — BP 158/81 | HR 78

## 2017-07-27 DIAGNOSIS — E1122 Type 2 diabetes mellitus with diabetic chronic kidney disease: Secondary | ICD-10-CM | POA: Diagnosis not present

## 2017-07-27 DIAGNOSIS — E669 Obesity, unspecified: Secondary | ICD-10-CM

## 2017-07-27 DIAGNOSIS — N183 Chronic kidney disease, stage 3 unspecified: Secondary | ICD-10-CM

## 2017-07-27 DIAGNOSIS — I6522 Occlusion and stenosis of left carotid artery: Secondary | ICD-10-CM | POA: Diagnosis not present

## 2017-07-27 DIAGNOSIS — E1169 Type 2 diabetes mellitus with other specified complication: Secondary | ICD-10-CM

## 2017-07-27 DIAGNOSIS — I10 Essential (primary) hypertension: Secondary | ICD-10-CM | POA: Diagnosis not present

## 2017-07-27 DIAGNOSIS — Z833 Family history of diabetes mellitus: Secondary | ICD-10-CM | POA: Diagnosis not present

## 2017-07-27 DIAGNOSIS — I129 Hypertensive chronic kidney disease with stage 1 through stage 4 chronic kidney disease, or unspecified chronic kidney disease: Secondary | ICD-10-CM | POA: Insufficient documentation

## 2017-07-27 DIAGNOSIS — Z8249 Family history of ischemic heart disease and other diseases of the circulatory system: Secondary | ICD-10-CM | POA: Diagnosis not present

## 2017-07-27 DIAGNOSIS — R4701 Aphasia: Secondary | ICD-10-CM

## 2017-07-27 DIAGNOSIS — I6930 Unspecified sequelae of cerebral infarction: Secondary | ICD-10-CM | POA: Diagnosis not present

## 2017-07-27 DIAGNOSIS — I69318 Other symptoms and signs involving cognitive functions following cerebral infarction: Secondary | ICD-10-CM | POA: Diagnosis not present

## 2017-07-27 DIAGNOSIS — Z9889 Other specified postprocedural states: Secondary | ICD-10-CM | POA: Diagnosis not present

## 2017-07-27 DIAGNOSIS — Z823 Family history of stroke: Secondary | ICD-10-CM | POA: Insufficient documentation

## 2017-07-27 DIAGNOSIS — E785 Hyperlipidemia, unspecified: Secondary | ICD-10-CM | POA: Insufficient documentation

## 2017-07-27 DIAGNOSIS — R41844 Frontal lobe and executive function deficit: Secondary | ICD-10-CM

## 2017-07-27 DIAGNOSIS — R41840 Attention and concentration deficit: Secondary | ICD-10-CM

## 2017-07-27 DIAGNOSIS — I63512 Cerebral infarction due to unspecified occlusion or stenosis of left middle cerebral artery: Secondary | ICD-10-CM | POA: Insufficient documentation

## 2017-07-27 DIAGNOSIS — I639 Cerebral infarction, unspecified: Secondary | ICD-10-CM

## 2017-07-27 DIAGNOSIS — I69351 Hemiplegia and hemiparesis following cerebral infarction affecting right dominant side: Secondary | ICD-10-CM | POA: Insufficient documentation

## 2017-07-27 DIAGNOSIS — Z794 Long term (current) use of insulin: Secondary | ICD-10-CM

## 2017-07-27 DIAGNOSIS — R2681 Unsteadiness on feet: Secondary | ICD-10-CM

## 2017-07-27 DIAGNOSIS — R41841 Cognitive communication deficit: Secondary | ICD-10-CM

## 2017-07-27 DIAGNOSIS — R41842 Visuospatial deficit: Secondary | ICD-10-CM

## 2017-07-27 MED ORDER — INSULIN GLARGINE 100 UNIT/ML SOLOSTAR PEN: 18.0000 [IU] | PEN_INJECTOR | Freq: Every day | SUBCUTANEOUS | 6 refills | Status: DC

## 2017-07-27 NOTE — Therapy (Signed)
[facility] [address] [place], [place], [address] Phone: [phone]   Fax:  [phone]  Speech Language Pathology Treatment  Patient Details  Name: [name] [name] MRN: [id number] Date of Birth: Apr 22, 1946 Referring Provider: Jamse Arn, MD   Encounter Date: 07/27/2017  End of Session - 07/27/17 1716    Visit Number  2    Number of Visits  17    Date for SLP Re-Evaluation  09/22/17    SLP Start Time  3545    SLP Stop Time   1700    SLP Time Calculation (min)  44 min    Activity Tolerance  Patient tolerated treatment well       Past Medical History:  Diagnosis Date  . DIABETES MELLITUS, TYPE II 01/02/2007  . HYPERLIPIDEMIA 06/18/2007  . HYPERTENSION 01/02/2007  . RENAL INSUFFICIENCY 02/05/2008    Past Surgical History:  Procedure Laterality Date  . LOOP RECORDER INSERTION N/A 07/11/2017   Procedure: LOOP RECORDER INSERTION;  Surgeon: Thompson Grayer, MD;  Location: [facility];  Service: Cardiovascular;  Laterality: N/A;  . SHOULDER SURGERY     left  . TEE WITHOUT CARDIOVERSION N/A 07/11/2017   Procedure: TRANSESOPHAGEAL ECHOCARDIOGRAM (TEE);  Surgeon: Dorothy Spark, MD;  Location: [facility] ENDOSCOPY;  Service: Cardiovascular;  Laterality: N/A;  . TRANSURETHRAL RESECTION OF PROSTATE     history of retention/hematuria     There were no vitals filed for this visit.  Subjective Assessment - 07/27/17 1619    Subjective  "It's been going pretty good."    Currently in Pain?  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 SLP introduced multimodal communication strategies and focused on description/word associations. Pt able to generate 3-4 associations with given word with extended time and occasional min-mod A question cues. Simple convergent naming 100% accuracy. Phrase completion 90% accuracy. Fill in letters items for simple categories 80% accuracy, occasional min A.       Assessment / Recommendations / Plan   Plan  Continue with current plan of care      Progression Toward Goals   Progression toward goals  Progressing toward goals       SLP Education - 07/27/17 1716    Education provided  Yes    Education Details  multimodal communication strategies, take note of "communication breakdowns" at home    Person(s) Educated  Patient;Spouse    Methods  Explanation;Demonstration;Verbal cues    Comprehension  Verbalized understanding;Need further instruction       SLP Short Term Goals - 07/27/17 1726      SLP SHORT TERM GOAL #1   Title  pt will demonstrate word finding compensations in 7 minutes simple conversation with rare min A for their use    Time  4    Period  Weeks    Status  On-going  SLP SHORT TERM GOAL #2   Title  pt will undergo cognitvie linguistic testing and goals to be added PRN    Time  2    Period  Weeks    Status  On-going      SLP SHORT TERM GOAL #3   Title  pt will achieve 85% success with simple word finding tasks over 3 sessions    Time  4    Period  Weeks    Status  On-going      SLP SHORT TERM GOAL #4   Title  pt will demo understanding of mod complex directions 85% success over three sessions    Time  4    Period  Weeks    Status  On-going       SLP Long Term Goals - 07/27/17 1726      SLP LONG TERM GOAL #1   Title  pt will functionally use compensatory strategies for anomia in 8 minutes mod complex conversation     Time  8    Period  Weeks    Status  On-going      SLP LONG TERM GOAL #2   Title  pt will complete mod complex naming tasks with 90% success with modified independence (use of compensations)    Time  8    Period  Weeks    Status  On-going      SLP LONG TERM GOAL #3   Title  pt will demo functional comprehension in 10 minutes mod complex-complex convresation over three sessions    Time  8    Period  Weeks    Status  On-going       Plan - 07/27/17 1726    Clinical Impression Statement  Pt presents today with anomia, vague speech and pausing/hesitation during simple conversation. Simple conversation was functional with min SLP questioning cues. Min-mod A required today in simple naming tasks. Agree further testing of cognition is necessary. Pt would benefit from skilled ST to address expressive language and cognitive linguistic deficits.    Speech Therapy Frequency  2x / week    Treatment/Interventions  Language facilitation;Environmental controls;Cueing hierarchy;SLP instruction and feedback;Compensatory strategies;Patient/family education;Multimodal communcation approach;Internal/external aids;Cognitive reorganization;Functional tasks    Potential to Achieve Goals  Good    Consulted and Agree with Plan of Care  Patient;Family member/caregiver       Patient will benefit from skilled therapeutic intervention in order to improve the following deficits and impairments:   Aphasia  Cognitive communication deficit    Problem List Patient Active Problem List   Diagnosis Date Noted  . Hypoglycemia   . Uncontrolled hypertension   . Hypertensive urgency   . Hypertensive crisis   . Acute blood loss anemia   . Hypokalemia   . Acute ischemic left MCA stroke ([place]) 07/12/2017  . Diabetes mellitus type 2 in obese ([place])   . Stage 3 chronic kidney disease ([place])   . Benign essential HTN   . Left middle cerebral artery stroke ([place]) 07/09/2017  . Acute embolic stroke ([place])   . Aphasia  07/08/2017  . Gout 07/21/2011  . Prostatitis, chronic 07/14/2010  . RENAL INSUFFICIENCY 02/05/2008  . Hyperlipidemia 06/18/2007  . DM type 2 (diabetes mellitus, type 2) ([place]) 01/02/2007  . Hypertensive kidney disease 01/02/2007   Deneise Lever, [place], [place] 07/27/2017, 5:27 PM  [facility] [address] [place], [place], [address] Phone: [phone]   Fax:  [phone]   Name: [name] [name] MRN: [id number] Date of Birth: February 15, 1946

## 2017-07-27 NOTE — Telephone Encounter (Signed)
Copied from [place]. Topic: Quick Communication - See Telephone Encounter >> Jul 27, 2017 10:16 AM Arletha Grippe wrote: CRM for notification. See Telephone encounter for:   07/27/17. Wife called - the pharmacy has told her that they have no record of receiving the lantus medicatio from 07/25/17 visit.  [facility] on [address]. Phone is [phone]

## 2017-07-27 NOTE — Patient Instructions (Signed)
  Feet Partial Heel-Toe (Compliant Surface) Head Motion - Eyes Closed    Stand on compliant surface: pillows/folded blanket with left foot partially in front of right foot. Close eyes hold for 30 seconds. Then move head slowly, up and down x 10, then left and right x 10. Repeat with right foot forward. Repeat 1 times per session. Do 1 sessions per day. Copyright  VHI. All rights reserved.     Tandem Walking    Stand near counter and use no hands or just 1 finger touching counter. Walk with each foot directly in front of other, heel of one foot touching toes of other foot with each step. Both feet straight ahead. Then walk backwards heel to toe. Repeat 5 times each direction.    Functional Quadriceps: Sit to Stand    Sit on edge of chair, left foot on a step stool or thick book and right foot flat on floor. Stand upright, extending knees fully. Slowly lower back to sitting. Repeat _10___ times per set. Do __2__ sets per session. Do __1-2__ sessions per day.

## 2017-07-27 NOTE — Therapy (Signed)
[name] [address] [place], [place], [address] Phone: [phone]   Fax:  [phone]  Occupational Therapy Treatment  Patient Details  Name: [name] [name] MRN: [id number] Date of Birth: [birth year] Referring Provider: Reesa Chew, PA-C (f/u with Dr. Posey Pronto)   Encounter Date: 07/27/2017  OT End of Session - 07/27/17 0825    Visit Number  3    Number of Visits  17    Date for OT Re-Evaluation  09/15/17    Authorization Type  UHC Medicare    Authorization Time Period  cert. date 07/17/17-09/15/17    OT Start Time  0805    OT Stop Time  0845    OT Time Calculation (min)  40 min    Activity Tolerance  Patient tolerated treatment well    Behavior During Therapy  [facility] for tasks assessed/performed       Past Medical History:  Diagnosis Date  . DIABETES MELLITUS, TYPE II 01/02/2007  . HYPERLIPIDEMIA 06/18/2007  . HYPERTENSION 01/02/2007  . RENAL INSUFFICIENCY 02/05/2008    Past Surgical History:  Procedure Laterality Date  . LOOP RECORDER INSERTION N/A 07/11/2017   Procedure: LOOP RECORDER INSERTION;  Surgeon: Thompson Grayer, MD;  Location: [place] [facility];  Service: Cardiovascular;  Laterality: N/A;  . SHOULDER SURGERY     left  . TEE WITHOUT CARDIOVERSION N/A 07/11/2017   Procedure: TRANSESOPHAGEAL ECHOCARDIOGRAM (TEE);  Surgeon: Dorothy Spark, MD;  Location: [facility] ENDOSCOPY;  Service: Cardiovascular;  Laterality: N/A;  . TRANSURETHRAL RESECTION OF PROSTATE     history of retention/hematuria     There were no vitals filed for this visit.  Subjective Assessment - 07/27/17 0824    Subjective   Denies pain    Pertinent History  L middle cerebral artery stroke; DM, hyperlipidemia, renal insufficiency, HTN, hx L shoulder surgery, loop recorder    Patient Stated Goals  improve speech, improve R side attention (needed prompting)    Currently in Pain?  No/denies          Treatment: Environmental scanning 4/13  items missed on first pass, all items were low, min v.c to locate remaining cards. Simple organization task , min-mod v.c for completion, improved handwriting with foam grip. Number copying task with 100% accuracy. Completing 12 piece puzzle, mod v.c/ difficulty for increased scanning, organization.                    OT Short Term Goals - 07/17/17 1521      OT SHORT TERM GOAL #1   Title  Pt will be independent with HEP for cognitive/visual deficits.--check STGs 08/18/17    Time  4    Period  Weeks    Status  New      OT SHORT TERM GOAL #2   Title  Pt will perform complex tabletop visual scanning with at least 95% accuracy.    Time  4    Period  Weeks    Status  New      OT SHORT TERM GOAL #3   Title  Pt will attend to functional task for at least 65min in busy environment.    Time  4    Period  Weeks    Status  New      OT SHORT TERM GOAL #4   Title  Pt will perform mod complex organization tasks with at least 75% accuracy.    Time  4    Period  Weeks  Status  New        OT Long Term Goals - 07/17/17 1523      OT LONG TERM GOAL #1   Title  Pt will perform mod complex environmental scanning with at least 90% accuracy.    Time  8    Period  Weeks    Status  New      OT LONG TERM GOAL #2   Title  Pt will perform mod complex organization tasks with at least 90% accuracy.    Time  8    Period  Weeks    Status  New      OT LONG TERM GOAL #3   Title  Pt will divide attention between cognitive and physical task with at least 85% accuracy for improved safety with community activities/in prep for driving.    Time  8    Period  Weeks    Status  New            Plan - 07/27/17 1610    Clinical Impression Statement  Pt is progressing towards goals. Pt demonstrated improved performance of simple organization task today.    Rehab Potential  Good    OT Frequency  2x / week    OT Duration  8 weeks    OT Treatment/Interventions  Self-care/ADL  training;Moist Heat;DME and/or AE instruction;Therapeutic activities;Cognitive remediation/compensation;Therapeutic exercise;Neuromuscular education;Cryotherapy;Functional Mobility Training;Passive range of motion;Visual/perceptual remediation/compensation;Patient/family education;Energy conservation;Manual Therapy    Plan  cognitive/ visual HEP prn, simple organization tasks    Consulted and Agree with Plan of Care  Patient;Family member/caregiver       Patient will benefit from skilled therapeutic intervention in order to improve the following deficits and impairments:  Decreased cognition, Impaired vision/preception, Decreased coordination, Impaired perceived functional ability, Decreased safety awareness, Decreased knowledge of precautions  Visit Diagnosis: Other symptoms and signs involving cognitive functions following cerebral infarction  Attention and concentration deficit  Frontal lobe and executive function deficit  Visuospatial deficit    Problem List Patient Active Problem List   Diagnosis Date Noted  . Hypoglycemia   . Uncontrolled hypertension   . Hypertensive urgency   . Hypertensive crisis   . Acute blood loss anemia   . Hypokalemia   . Acute ischemic left MCA stroke ([place]) 07/12/2017  . Diabetes mellitus type 2 in obese ([place])   . Stage 3 chronic kidney disease ([place])   . Benign essential HTN   . Left middle cerebral artery stroke ([place]) 07/09/2017  . Acute embolic stroke ([place])   . Aphasia 07/08/2017  . Gout 07/21/2011  . Prostatitis, chronic 07/14/2010  . RENAL INSUFFICIENCY 02/05/2008  . Hyperlipidemia 06/18/2007  . DM type 2 (diabetes mellitus, type 2) ([place]) 01/02/2007  . Hypertensive kidney disease 01/02/2007    RINE,KATHRYN 07/27/2017, 9:42 AM  [facility] [address] [place], [place], [address] Phone: [phone]   Fax:  [phone]  Name: [name] [name] MRN: [id number] Date of  Birth: March 03, 1946

## 2017-07-27 NOTE — Telephone Encounter (Signed)
Rx was set to No Print so did not go through. Resent as Normal. Receipt confirmed by pharmacy.

## 2017-07-27 NOTE — Patient Outreach (Signed)
[facility]) Care Management  07/27/2017  [name] [name] [birth year] [id number]       EMMI-STROKE RED ON EMMI ALERT Day #  9 Date:07/26/17 Red Alert Reason: " Questions/problems with meds? Yes"   Outreach attempt #  1 to patient. No answer at present and unable to leave message.       Plan: RN CM will make outreach attempt to patient within one business day.  [name], RN,BSN,CCM [facility] Telephonic Care Management Coordinator Direct Phone: [phone] Toll Free: [phone] Fax: [phone]

## 2017-07-27 NOTE — Therapy (Signed)
[facility] [address] [place], [place], [address] Phone: [phone]   Fax:  [phone]  Physical Therapy Treatment  Patient Details  Name: [name] [name] MRN: [id number] Date of Birth: 1946-01-27 Referring Provider: Reesa Chew, PA-C (f/u with Dr. Posey Pronto)   Encounter Date: 07/27/2017  PT End of Session - 07/27/17 0843    Visit Number  2    Number of Visits  6    Date for PT Re-Evaluation  09/02/17    Authorization Type  UHC medicare $40 copay for PT and ST combined    PT Start Time  0845    PT Stop Time  0930    PT Time Calculation (min)  45 min    Activity Tolerance  Patient tolerated treatment well    Behavior During Therapy  [facility] for tasks assessed/performed minimal verbal interaction due to speech deficits       Past Medical History:  Diagnosis Date  . DIABETES MELLITUS, TYPE II 01/02/2007  . HYPERLIPIDEMIA 06/18/2007  . HYPERTENSION 01/02/2007  . RENAL INSUFFICIENCY 02/05/2008    Past Surgical History:  Procedure Laterality Date  . LOOP RECORDER INSERTION N/A 07/11/2017   Procedure: LOOP RECORDER INSERTION;  Surgeon: Thompson Grayer, MD;  Location: [facility];  Service: Cardiovascular;  Laterality: N/A;  . SHOULDER SURGERY     left  . TEE WITHOUT CARDIOVERSION N/A 07/11/2017   Procedure: TRANSESOPHAGEAL ECHOCARDIOGRAM (TEE);  Surgeon: Dorothy Spark, MD;  Location: [facility] ENDOSCOPY;  Service: Cardiovascular;  Laterality: N/A;  . TRANSURETHRAL RESECTION OF PROSTATE     history of retention/hematuria     There were no vitals filed for this visit.  Subjective Assessment - 07/27/17 0840    Subjective  Reports he feels about 90% back to normal in terms of his balance.     Pertinent History  diabetes, renal insufficiency, HTN, h/o L shoulder surgery     Patient Stated Goals  Unsure- hard to vocalize due to speech difficulties.     Currently in Pain?  No/denies                      [facility]  Adult PT Treatment/Exercise - 07/27/17 0001      Exercises   Exercises  Knee/Hip      Knee/Hip Exercises: Seated   Sit to Sand  15 reps;without UE support 5 feet level; 10 left foot on 3" step          Balance Exercises - 07/27/17 0925      Balance Exercises: Standing   Standing Eyes Opened  Narrow base of support (BOS);Head turns;Foam/compliant surface;30 secs horizontial, vertical,     Standing Eyes Closed  Narrow base of support (BOS);Head turns;Foam/compliant surface feet together, partial tandem    Tandem Stance  Eyes open;30 secs    SLS  Eyes open;2 reps;30 secs with head turns    Balance Beam  2 black beams side by side (due to size of pt's feet); feet together, head turns, EC, step off/on forward and backward requiring minguard assist for safety due to LOB    Gait with Head Turns  Forward    Tandem Gait  Forward;Retro;Intermittent upper extremity support;4 reps in // bars    Retro Gait  -- 20 ft        PT Education - 07/27/17 1853    Education provided  Yes    Education Details  systems contributing to maintaining balance; updated HEP  Person(s) Educated  Patient;Spouse    Methods  Explanation;Demonstration;Handout    Comprehension  Verbalized understanding;Returned demonstration;Need further instruction       PT Short Term Goals - 07/19/17 1044      PT SHORT TERM GOAL #1   Title  The patient will return demo HEP for high level balance and dynamic gait activities.    Time  4    Period  Weeks    Target Date  08/18/17      PT SHORT TERM GOAL #2   Title  The patient will improve FGA from 21/30 to > or equal to 24/30 to demonstrate improved dynamic gait abilities for community mobility.    Time  4    Period  Weeks    Target Date  08/18/17      PT SHORT TERM GOAL #3   Title  The patient will return to modified gym routine [facility] [employer] (initially recommending walking on indoor track) to include cardio, strengthening.    Time  4    Period  Weeks    Target Date   08/18/17      PT SHORT TERM GOAL #4   Title  --        PT Long Term Goals - 07/19/17 1046      PT LONG TERM GOAL #1   Title  The patient will be independent with post d/c wellness program.    Time  6    Period  Weeks    Target Date  09/02/17      PT LONG TERM GOAL #2   Title  The patient will improve FGA from 21/30 to > or equal to 26/30 to demonstrate improving dynamic mobility.    Time  6    Period  Weeks    Target Date  09/02/17      PT LONG TERM GOAL #3   Title  The patient will improve Berg from 49/56 to > or equal to 53/56 to demonstrate improved narrow base of support balance/    Time  6    Period  Weeks    Target Date  09/02/17            Plan - 07/27/17 1856    Clinical Impression Statement  Session focused on establishing/updating HEP for balance. Patient with difficulty following pure verbal instructions and required demonstration several times during session. Wife present and attentive. Anticipate continued excellent progress with mobility and balance.     Rehab Potential  Good    PT Frequency  1x / week    PT Duration  6 weeks    PT Treatment/Interventions  ADLs/Self Care Home Management;Balance training;Neuromuscular re-education;Patient/family education;Gait training;Functional mobility training;Therapeutic activities;Therapeutic exercise    PT Next Visit Plan  check understanding of HEP given 1/24; write down formal walking program; further assess R inattention with gait in busy environments    Consulted and Agree with Plan of Care  Patient;Family member/caregiver    Family Member Consulted  spouse       Patient will benefit from skilled therapeutic intervention in order to improve the following deficits and impairments:  Decreased activity tolerance, Decreased balance, Difficulty walking  Visit Diagnosis: Unsteadiness on feet     Problem List Patient Active Problem List   Diagnosis Date Noted  . Hypoglycemia   . Uncontrolled hypertension    . Hypertensive urgency   . Hypertensive crisis   . Acute blood loss anemia   . Hypokalemia   . Acute ischemic  left MCA stroke ([place]) 07/12/2017  . Diabetes mellitus type 2 in obese ([place])   . Stage 3 chronic kidney disease ([place])   . Benign essential HTN   . Left middle cerebral artery stroke ([place]) 07/09/2017  . Acute embolic stroke ([place])   . Aphasia 07/08/2017  . Gout 07/21/2011  . Prostatitis, chronic 07/14/2010  . RENAL INSUFFICIENCY 02/05/2008  . Hyperlipidemia 06/18/2007  . DM type 2 (diabetes mellitus, type 2) ([place]) 01/02/2007  . Hypertensive kidney disease 01/02/2007    Rexanne Mano, PT 07/27/2017, 7:00 PM  [facility] [address] [place], [place], [address] Phone: [phone]   Fax:  [phone]  Name: [name] [name] MRN: [id number] Date of Birth: 03/25/1946

## 2017-07-27 NOTE — Progress Notes (Signed)
Subjective:    Patient ID: [name] [name], male    DOB: May 26, 1946, 72 y.o.   MRN: [id number]  HPI 72 y.o. male with history of HTN, T2DM presents for hospital follow up after receiving CIR for left MCA infarct.  Admit date: 07/11/2017 Discharge date: 07/16/2017  Wife provides majority of history. At discharge, he was instructed to follow up with Nephrology, which he still has to make an appointment with. He has an appointment with PCP and Neurology. BPs have been elevated at home.  CBGs have been relatively controlled. He does not have an appointment with Vascular surgery.   Therapies: 2/week. Mobility: No assistive device required  DME: None required  Pain Inventory Average Pain 0 Pain Right Now 0 My pain is na  In the last 24 hours, has pain interfered with the following? General activity 0 Relation with others 0 Enjoyment of life 0 What TIME of day is your pain at its worst? na Sleep (in general) Good  Pain is worse with: na Pain improves with: na Relief from Meds: na  Mobility walk without assistance ability to climb steps?  yes do you drive?  no  Function retired I need assistance with the following:  meal prep, household duties and shopping  Neuro/Psych tremor  Prior Studies Any changes since last visit?  no  Physicians involved in your care Any changes since last visit?  no   Family History  Problem Relation Age of Onset  . Hypertension Mother   . Diabetes Mother   . Stroke Father   . Stroke Maternal Grandmother    Social History   Socioeconomic History  . Marital status: Married    Spouse name: None  . Number of children: None  . Years of education: None  . Highest education level: None  Social Needs  . Financial resource strain: None  . Food insecurity - worry: None  . Food insecurity - inability: None  . Transportation needs - medical: None  . Transportation needs - non-medical: None  Occupational History  . None  Tobacco Use  .  Smoking status: Never Smoker  . Smokeless tobacco: Never Used  Substance and Sexual Activity  . Alcohol use: No  . Drug use: No  . Sexual activity: None  Other Topics Concern  . 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 Likes to go to football games.    Past Surgical History:  Procedure Laterality Date  . LOOP RECORDER INSERTION N/A 07/11/2017   Procedure: LOOP RECORDER INSERTION;  Surgeon: Thompson Grayer, MD;  Location: [facility];  Service: Cardiovascular;  Laterality: N/A;  . SHOULDER SURGERY     left  . TEE WITHOUT CARDIOVERSION N/A 07/11/2017   Procedure: TRANSESOPHAGEAL ECHOCARDIOGRAM (TEE);  Surgeon: Dorothy Spark, MD;  Location: [facility] ENDOSCOPY;  Service: Cardiovascular;  Laterality: N/A;  . TRANSURETHRAL RESECTION OF PROSTATE     history of retention/hematuria    Past Medical History:  Diagnosis Date  . DIABETES MELLITUS, TYPE II 01/02/2007  . HYPERLIPIDEMIA 06/18/2007  . HYPERTENSION 01/02/2007  . RENAL INSUFFICIENCY 02/05/2008   BP (!) 158/81   Pulse 78   SpO2 98%   Opioid Risk Score:   Fall Risk Score:  `1  Depression screen PHQ 2/9  Depression screen [facility] 2/9 07/27/2017 04/08/2015  04/02/2013  Decreased Interest 3 0 0  Down, Depressed, Hopeless 1 0 0  PHQ - 2 Score 4 0 0  Altered sleeping 0 - -  Tired, decreased energy 1 - -  Change in appetite 0 - -  Feeling bad or failure about yourself  1 - -  Trouble concentrating 1 - -  Moving slowly or fidgety/restless 1 - -  Suicidal thoughts 0 - -  PHQ-9 Score 8 - -  Difficult doing work/chores Somewhat difficult - -     Review of Systems  Constitutional: Positive for diaphoresis and unexpected weight change.  HENT: Negative.   Eyes: Negative.   Respiratory: Positive for cough.   Cardiovascular: Negative.   Gastrointestinal: Negative.   Endocrine: Negative.     Genitourinary: Negative.   Musculoskeletal: Positive for gait problem.  Skin: Negative.   Allergic/Immunologic: Negative.   Neurological: Positive for speech difficulty. Negative for numbness.  Hematological: Negative.   Psychiatric/Behavioral: Negative.   All other systems reviewed and are negative.      Objective:   Physical Exam Constitutional: He appears well-developed and well-nourished.  HENT: Normocephalic and atraumatic.  Eyes: EOM are normal. No discharge.  Cardiovascular: RRR. No JVD. Respiratory: Effort normal and breath sounds normal.  GI: Bowel sounds are normal. There is no tenderness.  Musculoskeletal: He exhibits no edema, no tenderness.  Neurological: He is alert and oriented.  Motor: RUE: 5/5 prox to distal.  RLE: 5/5 proximal to distal Skin: Skin is warm and dry.  Psychiatric: Flat.    Assessment & Plan:  72 y.o. male with history of HTN, T2DM presents for hospital follow up after receiving CIR for left MCA infarct.  1. Right hemparesis and aphasia secondary to left MCA infarct  Cont therapies  Follow up with Neurology  2. HTN  Records reviewed, remains elevated, although appear to be slightly improving  Cont meds per PCP vs Nephro?  3. CKD stage III  Follow up with Nephro   4. Left ICA plaque:    Needs follow up with Vascular surgery, based on plan, will encourage tighter blood pressure control   5. DM   Recordings reviewed  Follow up with PCP for further adjustments  Meds reviewed Referrals reviewed- needs appointment with [facility], Vascular

## 2017-07-28 ENCOUNTER — Other Ambulatory Visit: Payer: Self-pay

## 2017-07-28 NOTE — Patient Outreach (Signed)
[facility]) Care Management  07/28/2017  Kengo Sturges [place] 1945/08/12 [id number]       EMMI-STROKE RED ON EMMI ALERT Day # 9 Date: 07/26/17 Red Alert Reason: "Questions/problems with meds? Yes"   Outreach attempt #  2 to patient. No answer at present and RN CM unable to leave message.         Plan: RN CM will make outreach attempt to patient within one business day.   [name], RN,BSN,CCM [facility] Management Telephonic Care Management Coordinator Direct Phone: [phone] Toll Free: [phone] Fax: [phone]     Outreach attempt #   Spoke with Reviewed and addressed red alert       Plan: RN CM will   [name], RN,BSN,CCM [facility] Telephonic Care Management Coordinator Direct Phone: [phone] Toll Free: [phone] Fax: [phone]

## 2017-07-31 ENCOUNTER — Ambulatory Visit: Payer: Self-pay

## 2017-07-31 ENCOUNTER — Encounter: Payer: Self-pay | Admitting: Family Medicine

## 2017-07-31 ENCOUNTER — Ambulatory Visit: Payer: Medicare Other | Admitting: Occupational Therapy

## 2017-07-31 ENCOUNTER — Ambulatory Visit: Payer: Medicare Other | Admitting: Speech Pathology

## 2017-07-31 ENCOUNTER — Ambulatory Visit: Payer: Medicare Other | Admitting: Family Medicine

## 2017-07-31 ENCOUNTER — Other Ambulatory Visit: Payer: Self-pay

## 2017-07-31 ENCOUNTER — Ambulatory Visit: Payer: Medicare Other | Admitting: Physical Therapy

## 2017-07-31 VITALS — BP 130/78 | HR 77 | Temp 98.6°F | Wt 228.8 lb

## 2017-07-31 DIAGNOSIS — J069 Acute upper respiratory infection, unspecified: Secondary | ICD-10-CM | POA: Diagnosis not present

## 2017-07-31 MED ORDER — HYDROCODONE-HOMATROPINE 5-1.5 MG/5ML PO SYRP: 5.0000 mL | ORAL_SOLUTION | ORAL | 0 refills | Status: DC | PRN

## 2017-07-31 NOTE — Telephone Encounter (Signed)
   Reason for Disposition . [1] Continuous (nonstop) coughing interferes with work or school AND [2] no improvement using cough treatment per protocol  Answer Assessment - Initial Assessment Questions 1. ONSET: "When did the cough begin?"      Started 1 week ago 2. SEVERITY: "How bad is the cough today?"      Moderate 3. RESPIRATORY DISTRESS: "Describe your breathing."      No 4. FEVER: "Do you have a fever?" If so, ask: "What is your temperature, how was it measured, and when did it start?"     One day - low grade 5. HEMOPTYSIS: "Are you coughing up any blood?" If so ask: "How much?" (flecks, streaks, tablespoons, etc.)     No 6. TREATMENT: "What have you done so far to treat the cough?" (e.g., meds, fluids, humidifier)     No 7. CARDIAC HISTORY: "Do you have any history of heart disease?" (e.g., heart attack, congestive heart failure)      High blood pressure, CVA 8. LUNG HISTORY: "Do you have any history of lung disease?"  (e.g., pulmonary embolus, asthma, emphysema)     nO 9. PE RISK FACTORS: "Do you have a history of blood clots?" (or: recent major surgery, recent prolonged travel, bedridden )     No 10. OTHER SYMPTOMS: "Do you have any other symptoms? (e.g., runny nose, wheezing, chest pain)       Runny nose 11. PREGNANCY: "Is there any chance you are pregnant?" "When was your last menstrual period?"       No 12. TRAVEL: "Have you traveled out of the country in the last month?" (e.g., travel history, exposures)       No  Protocols used: COUGH - ACUTE NON-PRODUCTIVE-A-AH  Non- productive cough and fatigue. Reports nephrologist increased his "blood pressure medicine and it has been running low." 143/78 over the weekend and this morning 108/65. Instructed wife to notify nephrology about low BP readings. Verbalizes understanding.

## 2017-07-31 NOTE — Progress Notes (Signed)
   Subjective:    Patient ID: [name] [name], male    DOB: 1945/12/24, 72 y.o.   MRN: [id number]  HPI Here for 3 days of a dry cough. No fever or sinus drainage.    Review of Systems  Constitutional: Negative.   HENT: Negative.   Eyes: Negative.   Respiratory: Positive for cough. Negative for shortness of breath and wheezing.   Cardiovascular: Negative.        Objective:   Physical Exam  Constitutional: He appears well-developed and well-nourished. No distress.  HENT:  Right Ear: External ear normal.  Left Ear: External ear normal.  Nose: Nose normal.  Mouth/Throat: Oropharynx is clear and moist.  Eyes: Conjunctivae are normal.  Neck: No thyromegaly present.  Cardiovascular: Normal rate, regular rhythm, normal heart sounds and intact distal pulses.  Pulmonary/Chest: Effort normal and breath sounds normal. No respiratory distress. He has no wheezes. He has no rales.  Lymphadenopathy:    He has no cervical adenopathy.          Assessment & Plan:  Viral URI. Rest, drink fluids. Recheck prn.  Alysia Penna, MD

## 2017-07-31 NOTE — Patient Outreach (Signed)
[facility]) Care Management  07/31/2017  [name] [name] 07/07/1945 [id number]    EMMI-STROKE RED ON EMMI ALERT Day # 9 Date: 07/26/17 Red Alert Reason: "Questions/problems with meds? Yes"    Outreach attempt #3 to patient. Spoke with spouse. She voices that patient went to see PCP and nephrologist on last week. Renal Md increased Hydralazine by 25 mg. Since then patient has been having issues with his BP running low and some possible "blacking out spells." Spouse is diligently checking and monitoring BP several times a day. She voices that BP this morning was 108/65 which is very low for patient. She stets MD told them that his target BP range is in the 130's. She voices that she feels patient had a "blacking out" spell this morning while trying to brush his teeth. Patient does not even recall what happened. She has already contacted PCP office. They will be seeing him for an appt this afternoon. She has also bee advised to contact renal office to discuss issues that have presented since starting new dosage Spouse has done so already, left a message and is awaiting a return call. Patient was scheduled for outpatient therapy today but spouse has already cancelled that appt. Spouse also states that patient's blood sugars were up and down this past weekend but has resolved. She is also concerned about some congestion "and cold in chest" that she hears from patient when he coughs and breathes. She will address this issue today as well during appt. RN CM reviewed with spouse s/s of worsening condition and when to seek immediate medical attention. She voiced understanding and no further RN CM needs or concerns at this time.     Plan: RN CM will make outreach attempt to patient/spouse within five days to follow up.  [name], RN,BSN,CCM [place] Management Telephonic Care Management Coordinator Direct Phone: [phone] Toll Free: [phone] Fax:  [phone]

## 2017-08-02 ENCOUNTER — Other Ambulatory Visit: Payer: Self-pay

## 2017-08-02 ENCOUNTER — Ambulatory Visit: Payer: Medicare Other | Admitting: Physical Therapy

## 2017-08-02 ENCOUNTER — Ambulatory Visit: Payer: Medicare Other

## 2017-08-02 ENCOUNTER — Ambulatory Visit: Payer: Medicare Other | Admitting: Occupational Therapy

## 2017-08-02 DIAGNOSIS — R414 Neurologic neglect syndrome: Secondary | ICD-10-CM

## 2017-08-02 DIAGNOSIS — R41844 Frontal lobe and executive function deficit: Secondary | ICD-10-CM

## 2017-08-02 DIAGNOSIS — I69318 Other symptoms and signs involving cognitive functions following cerebral infarction: Secondary | ICD-10-CM | POA: Diagnosis not present

## 2017-08-02 DIAGNOSIS — R41840 Attention and concentration deficit: Secondary | ICD-10-CM

## 2017-08-02 DIAGNOSIS — R41842 Visuospatial deficit: Secondary | ICD-10-CM

## 2017-08-02 DIAGNOSIS — R4701 Aphasia: Secondary | ICD-10-CM

## 2017-08-02 DIAGNOSIS — R41841 Cognitive communication deficit: Secondary | ICD-10-CM

## 2017-08-02 NOTE — Therapy (Signed)
[facility] [address] [place], [place], [address] Phone: [phone]   Fax:  [phone]  Occupational Therapy Treatment  Patient Details  Name: [name] [name] MRN: [id number] Date of Birth: 08-19-1945 Referring Provider: Reesa Chew, PA-C (f/u with Dr. Posey Pronto)   Encounter Date: 08/02/2017  OT End of Session - 08/02/17 0957    Visit Number  4    Number of Visits  17    Date for OT Re-Evaluation  09/15/17    Authorization Type  UHC Medicare    Authorization Time Period  cert. date 07/17/17-09/15/17    OT Start Time  0935    OT Stop Time  1015    OT Time Calculation (min)  40 min       Past Medical History:  Diagnosis Date  . DIABETES MELLITUS, TYPE II 01/02/2007  . HYPERLIPIDEMIA 06/18/2007  . HYPERTENSION 01/02/2007  . RENAL INSUFFICIENCY 02/05/2008    Past Surgical History:  Procedure Laterality Date  . LOOP RECORDER INSERTION N/A 07/11/2017   Procedure: LOOP RECORDER INSERTION;  Surgeon: Thompson Grayer, MD;  Location: [place] [facility];  Service: Cardiovascular;  Laterality: N/A;  . SHOULDER SURGERY     left  . TEE WITHOUT CARDIOVERSION N/A 07/11/2017   Procedure: TRANSESOPHAGEAL ECHOCARDIOGRAM (TEE);  Surgeon: Dorothy Spark, MD;  Location: [facility] ENDOSCOPY;  Service: Cardiovascular;  Laterality: N/A;  . TRANSURETHRAL RESECTION OF PROSTATE     history of retention/hematuria     There were no vitals filed for this visit.  Subjective Assessment - 08/02/17 0956    Subjective   Denies pain    Pertinent History  L middle cerebral artery stroke; DM, hyperlipidemia, renal insufficiency, HTN, hx L shoulder surgery, loop recorder    Patient Stated Goals  improve speech, improve R side attention (needed prompting)    Currently in Pain?  No/denies            Treatment: copying small peg design for cognition, and visual perceptual skills, min v.c for correct design and to attend to right side/ use right  hand. Pipe tree design  X 2 min -mod v.c for locating correct pieces and for design                 OT Short Term Goals - 07/17/17 1521      OT SHORT TERM GOAL #1   Title  Pt will be independent with HEP for cognitive/visual deficits.--check STGs 08/18/17    Time  4    Period  Weeks    Status  New      OT SHORT TERM GOAL #2   Title  Pt will perform complex tabletop visual scanning with at least 95% accuracy.    Time  4    Period  Weeks    Status  New      OT SHORT TERM GOAL #3   Title  Pt will attend to functional task for at least 61min in busy environment.    Time  4    Period  Weeks    Status  New      OT SHORT TERM GOAL #4   Title  Pt will perform mod complex organization tasks with at least 75% accuracy.    Time  4    Period  Weeks    Status  New        OT Long Term Goals - 07/17/17 1523      OT LONG TERM GOAL #1  Title  Pt will perform mod complex environmental scanning with at least 90% accuracy.    Time  8    Period  Weeks    Status  New      OT LONG TERM GOAL #2   Title  Pt will perform mod complex organization tasks with at least 90% accuracy.    Time  8    Period  Weeks    Status  New      OT LONG TERM GOAL #3   Title  Pt will divide attention between cognitive and physical task with at least 85% accuracy for improved safety with community activities/in prep for driving.    Time  8    Period  Weeks    Status  New            Plan - 08/02/17 9509    Clinical Impression Statement  Pt is progressing towards goals. He demonstrates improving visual perceptual skills with functional tasks.    Rehab Potential  Good    OT Frequency  2x / week    OT Duration  8 weeks    OT Treatment/Interventions  Self-care/ADL training;Moist Heat;DME and/or AE instruction;Therapeutic activities;Cognitive remediation/compensation;Therapeutic exercise;Neuromuscular education;Cryotherapy;Functional Mobility Training;Passive range of  motion;Visual/perceptual remediation/compensation;Patient/family education;Energy conservation;Manual Therapy    Plan  cognitive/ visual HEP prn, simple organization tasks    Consulted and Agree with Plan of Care  Patient;Family member/caregiver       Patient will benefit from skilled therapeutic intervention in order to improve the following deficits and impairments:  Decreased cognition, Impaired vision/preception, Decreased coordination, Impaired perceived functional ability, Decreased safety awareness, Decreased knowledge of precautions  Visit Diagnosis: Attention and concentration deficit  Frontal lobe and executive function deficit  Visuospatial deficit  Neurologic neglect syndrome    Problem List Patient Active Problem List   Diagnosis Date Noted  . Hypoglycemia   . Uncontrolled hypertension   . Hypertensive urgency   . Hypertensive crisis   . Acute blood loss anemia   . Hypokalemia   . Acute ischemic left MCA stroke ([place]) 07/12/2017  . Diabetes mellitus type 2 in obese ([place])   . Stage 3 chronic kidney disease ([place])   . Benign essential HTN   . Left middle cerebral artery stroke ([place]) 07/09/2017  . Acute embolic stroke ([place])   . Aphasia 07/08/2017  . Gout 07/21/2011  . Prostatitis, chronic 07/14/2010  . RENAL INSUFFICIENCY 02/05/2008  . Hyperlipidemia 06/18/2007  . DM type 2 (diabetes mellitus, type 2) ([place]) 01/02/2007  . Hypertensive kidney disease 01/02/2007    RINE,KATHRYN 08/02/2017, 10:10 AM  [facility] [address] [place], [place], [address] Phone: [phone]   Fax:  [phone]  Name: KAMARE CASPERS MRN: [id number] Date of Birth: Nov 30, 1945

## 2017-08-03 ENCOUNTER — Other Ambulatory Visit: Payer: Self-pay | Admitting: Adult Health

## 2017-08-03 ENCOUNTER — Ambulatory Visit (INDEPENDENT_AMBULATORY_CARE_PROVIDER_SITE_OTHER): Payer: Self-pay | Admitting: *Deleted

## 2017-08-03 DIAGNOSIS — I639 Cerebral infarction, unspecified: Secondary | ICD-10-CM

## 2017-08-03 LAB — CUP PACEART INCLINIC DEVICE CHECK
Date Time Interrogation Session: 20190131091025
Implantable Pulse Generator Implant Date: 20190108

## 2017-08-03 NOTE — Therapy (Signed)
[facility] [address] [place], [place], [address] Phone: [phone]   Fax:  [phone]  Speech Language Pathology Treatment  Patient Details  Name: [name] [name] MRN: [id number] Date of Birth: [birth year] Referring Provider: Jamse Arn, MD   Encounter Date: 08/02/2017  End of Session - 08/02/17 1119    Visit Number  3    Number of Visits  17    Date for SLP Re-Evaluation  09/22/17    SLP Start Time  0850    SLP Stop Time   0932    SLP Time Calculation (min)  42 min    Activity Tolerance  Patient tolerated treatment well       Past Medical History:  Diagnosis Date  . DIABETES MELLITUS, TYPE II 01/02/2007  . HYPERLIPIDEMIA 06/18/2007  . HYPERTENSION 01/02/2007  . RENAL INSUFFICIENCY 02/05/2008    Past Surgical History:  Procedure Laterality Date  . LOOP RECORDER INSERTION N/A 07/11/2017   Procedure: LOOP RECORDER INSERTION;  Surgeon: Thompson Grayer, MD;  Location: [facility];  Service: Cardiovascular;  Laterality: N/A;  . SHOULDER SURGERY     left  . TEE WITHOUT CARDIOVERSION N/A 07/11/2017   Procedure: TRANSESOPHAGEAL ECHOCARDIOGRAM (TEE);  Surgeon: Dorothy Spark, MD;  Location: [facility] ENDOSCOPY;  Service: Cardiovascular;  Laterality: N/A;  . TRANSURETHRAL RESECTION OF PROSTATE     history of retention/hematuria     There were no vitals filed for this visit.  Subjective Assessment - 08/02/17 0855    Subjective  Wife tells SLP of possible attention, sequencing, initiation difficulties since CVA    Currently in Pain?  No/denies            ADULT SLP TREATMENT - 08/03/17 0001      General Information   Behavior/Cognition  Alert;Cooperative;Pleasant mood      Treatment Provided   Treatment provided  Cognitive-Linquistic      Cognitive-Linquistic Treatment   Treatment focused on  Cognition    Skilled Treatment  SLP initiated Cognitive Linguistic Quick Test (CLQT) to assess cognition.  Pt without deficits, per report, however wife shares examples of what appears to be pt attention and awareness deficiencies compared to baseline. Assessment reveals emergent awareness in linguistic tasks (following directions incorrectly even after examples shown to pt), and selective attention (pt completing tasks incorrectly after beginning them correctly). In recall of a story, pt answered 6/8 questions correctly, but only recalled 2/18 details of the story. Possible result from expressive aphasia as well, but SLP believes pt has some memory deficits, as 3 of 6 symbols (in sets of one pair each) were recalled correctly after 20 second delay. Pt also demonstrated problem solving deficit as he could not determine how to make clock hands more accurately.      Assessment / Recommendations / Plan   Plan  Continue with current plan of care      Progression Toward Goals   Progression toward goals  -- goals updated with additional cognitive focus       SLP Education - 08/02/17 1119    Education provided  Yes    Education Details  cognitive deficits, home tasks    Person(s) Educated  Patient;Spouse    Methods  Explanation    Comprehension  Verbalized understanding;Need further instruction       SLP Short Term Goals - 08/03/17 0933      SLP SHORT TERM GOAL #1   Title  pt will demonstrate word finding compensations  in 7 minutes simple conversation with rare min A for their use    Time  3    Period  Weeks    Status  On-going      SLP SHORT TERM GOAL #2   Title  pt will undergo cognitvie linguistic testing and goals to be added PRN    Status  Achieved      SLP SHORT TERM GOAL #3   Title  pt will achieve 85% success with simple word finding tasks over 3 sessions    Time  3    Period  Weeks    Status  On-going      SLP SHORT TERM GOAL #4   Title  pt will demo understanding of mod complex directions 85% success over three sessions    Time  3    Period  Weeks    Status  On-going      SLP  SHORT TERM GOAL #5   Title  pt will indicate 3 cognitive deficits in 2 therapy sessions    Time  3    Period  Weeks    Status  New      Additional Short Term Goals   Additional Short Term Goals  Yes      SLP SHORT TERM GOAL #6   Title  pt will demonstrate appropriate selective attention x4, in 5 minutes of therapy tasks, in 2 sessions    Time  3    Period  Weeks    Status  New      SLP SHORT TERM GOAL #7   Title  pt will initiate use of memory enhancing system and bring to ST during 6 therapy sessions    Time  3    Period  Weeks    Status  New       SLP Long Term Goals - 08/03/17 8295      SLP LONG TERM GOAL #1   Title  pt will functionally use compensatory strategies for anomia in 8 minutes mod complex conversation    Time  7    Period  Weeks    Status  On-going      SLP LONG TERM GOAL #2   Title  pt will complete mod complex naming tasks with 90% success with modified independence (use of compensations)    Time  7    Period  Weeks    Status  On-going      SLP LONG TERM GOAL #3   Title  pt will demo functional comprehension in 10 minutes mod complex-complex convresation over three sessions    Time  7    Period  Weeks    Status  On-going      SLP LONG TERM GOAL #4   Title  pt will demo selective attention x3 in 10 minutes of therapy tasks in a mod noisy environment     Time  7    Period  Weeks    Status  New      SLP LONG TERM GOAL #5   Title  pt will exhibit functional emergent awareness in mod complex cognitive linguistic therapy tasks    Time  7    Period  Weeks    Status  New      Additional Long Term Goals   Additional Long Term Goals  Yes      SLP LONG TERM GOAL #7   Title  pt will use memory enhancement system appropriately in or between 7 therapy sessions  Time  7    Period  Weeks    Status  New       Plan - 08/02/17 1120    Clinical Impression Statement  Pt underwent cognitive testing today revealing deficits in attention, awareness,  problem solving, and memory.  Pt would benefit from skilled ST to address expressive language and cognitive linguistic deficits.    Speech Therapy Frequency  2x / week    Duration  -- 8 weeks or 17 total sessions    Treatment/Interventions  Language facilitation;Environmental controls;Cueing hierarchy;SLP instruction and feedback;Compensatory strategies;Patient/family education;Multimodal communcation approach;Internal/external aids;Cognitive reorganization;Functional tasks    Potential to Achieve Goals  Good    Potential Considerations  Severity of impairments    Consulted and Agree with Plan of Care  Patient;Family member/caregiver    Family Member Consulted  wife       Patient will benefit from skilled therapeutic intervention in order to improve the following deficits and impairments:   Aphasia  Cognitive communication deficit    Problem List Patient Active Problem List   Diagnosis Date Noted  . Hypoglycemia   . Uncontrolled hypertension   . Hypertensive urgency   . Hypertensive crisis   . Acute blood loss anemia   . Hypokalemia   . Acute ischemic left MCA stroke ([place]) 07/12/2017  . Diabetes mellitus type 2 in obese ([place])   . Stage 3 chronic kidney disease ([place])   . Benign essential HTN   . Left middle cerebral artery stroke ([place]) 07/09/2017  . Acute embolic stroke ([place])   . Aphasia 07/08/2017  . Gout 07/21/2011  . Prostatitis, chronic 07/14/2010  . RENAL INSUFFICIENCY 02/05/2008  . Hyperlipidemia 06/18/2007  . DM type 2 (diabetes mellitus, type 2) ([place]) 01/02/2007  . Hypertensive kidney disease 01/02/2007    [facility], [place]  08/03/2017, 9:47 AM  [facility] [address] [place], [place], [address] Phone: [phone]   Fax:  [phone]   Name: [name] [name] MRN: [id number] Date of Birth: 08/24/1945

## 2017-08-03 NOTE — Progress Notes (Signed)
Wound Loop check in clinic. Steri-Strips removed prior to OV. Wound well healed, incison edges approximated. Battery status: Good R-waves 0.35 mV. 0 symptom episodes, 0 tachy episodes, Pause and brady off at implant. AF episodes 0. Monthly summary reports and ROV with JA PRN

## 2017-08-04 ENCOUNTER — Other Ambulatory Visit: Payer: Self-pay

## 2017-08-04 NOTE — Patient Outreach (Signed)
[facility]) Care Management  08/04/2017  [name] Jan 31, 1946 [id number]    EMMI-Stroke Follow Up Call    Outreach attempt #1 to patient/spouse. Spoke with spouse who reports things are going better for patient. Patient went to see PCP on 07/31/17. Per spouse he was diagnosed with " a little bug" per notes "Viral URI." Spouse reports patient is taking cough med to help with symptoms. She voices no further episodes of "almost blacking out." She reports that all of patient's vitals and lab work looks good. He went for wound check on yesterday and was told incision healing well. Spouse voices no further RN CM needs or concerns at this time. She was appreciative of follow up call.       Plan: RN CM will notify [facility] administrative assistant of case status.    [name], RN,BSN,CCM [place] Management Telephonic Care Management Coordinator Direct Phone: [phone] Toll Free: [phone] Fax: [phone]

## 2017-08-07 ENCOUNTER — Ambulatory Visit: Payer: Medicare Other | Attending: Physical Medicine and Rehabilitation | Admitting: Occupational Therapy

## 2017-08-07 ENCOUNTER — Encounter: Payer: Self-pay | Admitting: Physical Therapy

## 2017-08-07 ENCOUNTER — Ambulatory Visit: Payer: Medicare Other | Admitting: Physical Therapy

## 2017-08-07 ENCOUNTER — Ambulatory Visit: Payer: Medicare Other | Admitting: Speech Pathology

## 2017-08-07 ENCOUNTER — Encounter: Payer: Self-pay | Admitting: Occupational Therapy

## 2017-08-07 DIAGNOSIS — R4701 Aphasia: Secondary | ICD-10-CM | POA: Diagnosis present

## 2017-08-07 DIAGNOSIS — R414 Neurologic neglect syndrome: Secondary | ICD-10-CM | POA: Insufficient documentation

## 2017-08-07 DIAGNOSIS — R2681 Unsteadiness on feet: Secondary | ICD-10-CM | POA: Diagnosis present

## 2017-08-07 DIAGNOSIS — R2689 Other abnormalities of gait and mobility: Secondary | ICD-10-CM | POA: Diagnosis present

## 2017-08-07 DIAGNOSIS — I69318 Other symptoms and signs involving cognitive functions following cerebral infarction: Secondary | ICD-10-CM | POA: Diagnosis present

## 2017-08-07 DIAGNOSIS — R41841 Cognitive communication deficit: Secondary | ICD-10-CM | POA: Insufficient documentation

## 2017-08-07 DIAGNOSIS — R41844 Frontal lobe and executive function deficit: Secondary | ICD-10-CM | POA: Insufficient documentation

## 2017-08-07 DIAGNOSIS — R41842 Visuospatial deficit: Secondary | ICD-10-CM

## 2017-08-07 DIAGNOSIS — R41840 Attention and concentration deficit: Secondary | ICD-10-CM | POA: Insufficient documentation

## 2017-08-07 NOTE — Therapy (Signed)
[facility] [address] [place], [place], [address] Phone: [phone]   Fax:  [phone]  Physical Therapy Treatment  Patient Details  Name: [name] [name] MRN: [id number] Date of Birth: 24-Jun-1946 Referring Provider: Reesa Chew, PA-C (f/u with Dr. Posey Pronto)   Encounter Date: 08/07/2017  PT End of Session - 08/07/17 1944    Visit Number  3    Number of Visits  6    Date for PT Re-Evaluation  09/02/17    Authorization Type  UHC medicare $40 copay for PT and ST combined    PT Start Time  0934    PT Stop Time  1015    PT Time Calculation (min)  41 min    Activity Tolerance  Patient tolerated treatment well    Behavior During Therapy  [facility] for tasks assessed/performed minimal verbal interaction due to speech deficits       Past Medical History:  Diagnosis Date  . DIABETES MELLITUS, TYPE II 01/02/2007  . HYPERLIPIDEMIA 06/18/2007  . HYPERTENSION 01/02/2007  . RENAL INSUFFICIENCY 02/05/2008    Past Surgical History:  Procedure Laterality Date  . LOOP RECORDER INSERTION N/A 07/11/2017   Procedure: LOOP RECORDER INSERTION;  Surgeon: Thompson Grayer, MD;  Location: [facility];  Service: Cardiovascular;  Laterality: N/A;  . SHOULDER SURGERY     left  . TEE WITHOUT CARDIOVERSION N/A 07/11/2017   Procedure: TRANSESOPHAGEAL ECHOCARDIOGRAM (TEE);  Surgeon: Dorothy Spark, MD;  Location: [facility] ENDOSCOPY;  Service: Cardiovascular;  Laterality: N/A;  . TRANSURETHRAL RESECTION OF PROSTATE     history of retention/hematuria     There were no vitals filed for this visit.  Subjective Assessment - 08/07/17 0929    Subjective  Limited his exercise at home recently due to respiratory infection and "strong cough medicine" prescribed by MD. Makes him very sleepy and wife did not think he should work on his balance while taking this medicine.     Pertinent History  diabetes, renal insufficiency, HTN, h/o L shoulder surgery     Patient Stated Goals  Unsure- hard to vocalize due to speech difficulties.     Currently in Pain?  No/denies                      [facility] Adult PT Treatment/Exercise - 08/07/17 0001      Transfers   Transfers  Sit to Stand;Stand to Sit    Number of Reps  10 reps;2 sets single LE on 6" block each set      Ambulation/Gait   Ambulation/Gait Assistance  5: Supervision    Ambulation/Gait Assistance Details  head turns  (horiz, vertical, diagonal); passing ball over shoulder to PT behind and retrieve on other side of body while walking; tossing ball hand to hand while walking (did well with all tasks with minimal drift)          Balance Exercises - 08/07/17 0956      Balance Exercises: Standing   Standing Eyes Closed  Narrow base of support (BOS);Head turns;Foam/compliant surface partial tandem;     SLS  Eyes open;Foam/compliant surface;Intermittent upper extremity support;15 secs bil x 2 each; also with opp leg lite on yoga block, toss bal    SLS with Vectors  Foam/compliant surface;Intermittent upper extremity assist blue mat; single cone taps with side steps between    Tandem Gait  Forward;Intermittent upper extremity support;4 reps at counter    Sidestepping  Foam/compliant support  Step Over Hurdles / Cones  forward with mix of 6-8" hurdles; pt consistently caught his RLE on taller hurdle    Other Standing Exercises  on Bosu in // bars; incr difficulty with flat side up compared to flat side down; neither position would pt release hand from UE support          PT Short Term Goals - 07/19/17 1044      PT SHORT TERM GOAL #1   Title  The patient will return demo HEP for high level balance and dynamic gait activities.    Time  4    Period  Weeks    Target Date  08/18/17      PT SHORT TERM GOAL #2   Title  The patient will improve FGA from 21/30 to > or equal to 24/30 to demonstrate improved dynamic gait abilities for community mobility.    Time  4    Period   Weeks    Target Date  08/18/17      PT SHORT TERM GOAL #3   Title  The patient will return to modified gym routine [facility] [employer] (initially recommending walking on indoor track) to include cardio, strengthening.    Time  4    Period  Weeks    Target Date  08/18/17      PT SHORT TERM GOAL #4   Title  --        PT Long Term Goals - 07/19/17 1046      PT LONG TERM GOAL #1   Title  The patient will be independent with post d/c wellness program.    Time  6    Period  Weeks    Target Date  09/02/17      PT LONG TERM GOAL #2   Title  The patient will improve FGA from 21/30 to > or equal to 26/30 to demonstrate improving dynamic mobility.    Time  6    Period  Weeks    Target Date  09/02/17      PT LONG TERM GOAL #3   Title  The patient will improve Berg from 49/56 to > or equal to 53/56 to demonstrate improved narrow base of support balance/    Time  6    Period  Weeks    Target Date  09/02/17            Plan - 08/07/17 1946    Clinical Impression Statement  Session focused on review of additions to HEP from last visit as pt has been sick recently and unable to complete HEP. Patient required min cues for proper technique. Remainder of session focused on atrength, balance and gait training. Patient able to maintain straight path with gait >90% of time during cognitive and dual task challenges. Overall he is making good progress with PT. Will plan to check some goals next visit to assist with discharge planning.     Rehab Potential  Good    PT Frequency  1x / week    PT Duration  6 weeks    PT Treatment/Interventions  ADLs/Self Care Home Management;Balance training;Neuromuscular re-education;Patient/family education;Gait training;Functional mobility training;Therapeutic activities;Therapeutic exercise    PT Next Visit Plan  check a few STGs for d/c planning; write down formal walking program; further assess R inattention with gait in busy environments    Consulted and Agree with  Plan of Care  Patient;Family member/caregiver    Family Member Consulted  spouse  Patient will benefit from skilled therapeutic intervention in order to improve the following deficits and impairments:  Decreased activity tolerance, Decreased balance, Difficulty walking  Visit Diagnosis: Unsteadiness on feet  Other abnormalities of gait and mobility     Problem List Patient Active Problem List   Diagnosis Date Noted  . Hypoglycemia   . Uncontrolled hypertension   . Hypertensive urgency   . Hypertensive crisis   . Acute blood loss anemia   . Hypokalemia   . Acute ischemic left MCA stroke ([place]) 07/12/2017  . Diabetes mellitus type 2 in obese ([place])   . Stage 3 chronic kidney disease ([place])   . Benign essential HTN   . Left middle cerebral artery stroke ([place]) 07/09/2017  . Acute embolic stroke ([place])   . Aphasia 07/08/2017  . Gout 07/21/2011  . Prostatitis, chronic 07/14/2010  . RENAL INSUFFICIENCY 02/05/2008  . Hyperlipidemia 06/18/2007  . DM type 2 (diabetes mellitus, type 2) ([place]) 01/02/2007  . Hypertensive kidney disease 01/02/2007    Rexanne Mano, PT 08/07/2017, 7:51 PM  [facility] [address] [place], [place], [address] Phone: [phone]   Fax:  [phone]  Name: COUNCIL MUNGUIA MRN: [id number] Date of Birth: Jun 26, 1946

## 2017-08-07 NOTE — Patient Instructions (Signed)
   Compensation strategies: 1. Look for the edge of objects (to the left and/or right) so that you make sure you are seeing all of an object 2. Turn your head when walking, scan from side to side, particularly in busy environments and have someone with you for safety. 3. Use an organized scanning pattern. It's usually easier to scan from top to bottom, and left to right (like you are reading) 4. Double check yourself 5. Use a line guide (like a blank piece of paper) or your finger when reading 6. If necessary, place brightly colored tape at end of table or work area as a reminder to always look until you see the tape.  7. Large print will be easier to see. 8. Organize items in baskets and/or with labels so they are easier to see/find 9. Remove clutter 10. Make sure you have good lighting. 11. Consider using contrast/brightly colored strips on steps to make it easier to see the edge.   Visual Activities: 1. Simple word search 2. Read simple paragraphs/bible verses out loud 3. With someone with you, look for items in grocery store 4. Work simple jigsaw puzzles 5. Play simple matching games online/tablet that make you search visually. 6.  Play board/card games (memory/matching, solitaire, connect 4, etc.)

## 2017-08-07 NOTE — Therapy (Signed)
[facility] [address] [place], [place], [address] Phone: [phone]   Fax:  [phone]  Occupational Therapy Treatment  Patient Details  Name: [name] [name] MRN: [id number] Date of Birth: 1946-02-01 Referring Provider: Reesa Chew, PA-C (f/u with Dr. Posey Pronto)   Encounter Date: 08/07/2017  OT End of Session - 08/07/17 1024    Visit Number  5    Number of Visits  17    Date for OT Re-Evaluation  09/15/17    Authorization Type  [employer] Medicare    Authorization Time Period  cert. date 07/17/17-09/15/17    OT Start Time  1023    OT Stop Time  1101    OT Time Calculation (min)  38 min    Activity Tolerance  Patient tolerated treatment well    Behavior During Therapy  WFL for tasks assessed/performed       Past Medical History:  Diagnosis Date  . DIABETES MELLITUS, TYPE II 01/02/2007  . HYPERLIPIDEMIA 06/18/2007  . HYPERTENSION 01/02/2007  . RENAL INSUFFICIENCY 02/05/2008    Past Surgical History:  Procedure Laterality Date  . LOOP RECORDER INSERTION N/A 07/11/2017   Procedure: LOOP RECORDER INSERTION;  Surgeon: Thompson Grayer, MD;  Location: [facility];  Service: Cardiovascular;  Laterality: N/A;  . SHOULDER SURGERY     left  . TEE WITHOUT CARDIOVERSION N/A 07/11/2017   Procedure: TRANSESOPHAGEAL ECHOCARDIOGRAM (TEE);  Surgeon: Dorothy Spark, MD;  Location: [facility] ENDOSCOPY;  Service: Cardiovascular;  Laterality: N/A;  . TRANSURETHRAL RESECTION OF PROSTATE     history of retention/hematuria     There were no vitals filed for this visit.  Subjective Assessment - 08/07/17 1020    Subjective   Just working hard    Pertinent History  L middle cerebral artery stroke; DM, hyperlipidemia, renal insufficiency, HTN, hx L shoulder surgery, loop recorder    Patient Stated Goals  improve speech, improve R side attention (needed prompting)    Currently in Pain?  No/denies         Completing 12-piece puzzle for problem  solving, visual perceptual skills.  Pt needed mod cueing for reasoning/problem-solving and visual-perceptual aspects.  Tabletop visual scanning to match cards in sequence with good accuracy, incr time.   Horizontal 46M word search with min-mod cueing for use of line guide, to look to edge of paper.  Pt to complete remaining for homework.       OT Education - 08/07/17 1028    Education Details  Visual compensation Strategies; Visual/Cognitive HEP     Person(s) Educated  Spouse;Patient    Methods  Explanation;Demonstration;Verbal cues;Handout    Comprehension  Verbalized understanding;Returned demonstration;Verbal cues required       OT Short Term Goals - 07/17/17 1521      OT SHORT TERM GOAL #1   Title  Pt will be independent with HEP for cognitive/visual deficits.--check STGs 08/18/17    Time  4    Period  Weeks    Status  New      OT SHORT TERM GOAL #2   Title  Pt will perform complex tabletop visual scanning with at least 95% accuracy.    Time  4    Period  Weeks    Status  New      OT SHORT TERM GOAL #3   Title  Pt will attend to functional task for at least 12min in busy environment.    Time  4    Period  Weeks  Status  New      OT SHORT TERM GOAL #4   Title  Pt will perform mod complex organization tasks with at least 75% accuracy.    Time  4    Period  Weeks    Status  New        OT Long Term Goals - 07/17/17 1523      OT LONG TERM GOAL #1   Title  Pt will perform mod complex environmental scanning with at least 90% accuracy.    Time  8    Period  Weeks    Status  New      OT LONG TERM GOAL #2   Title  Pt will perform mod complex organization tasks with at least 90% accuracy.    Time  8    Period  Weeks    Status  New      OT LONG TERM GOAL #3   Title  Pt will divide attention between cognitive and physical task with at least 85% accuracy for improved safety with community activities/in prep for driving.    Time  8    Period  Weeks    Status  New             Plan - 08/07/17 1025    Clinical Impression Statement  Pt is progressing towards goals. He demonstrates improving visual perceptual skills with functional tasks and is performing recommendations for home.    Rehab Potential  Good    OT Frequency  2x / week    OT Duration  8 weeks    OT Treatment/Interventions  Self-care/ADL training;Moist Heat;DME and/or AE instruction;Therapeutic activities;Cognitive remediation/compensation;Therapeutic exercise;Neuromuscular education;Cryotherapy;Functional Mobility Training;Passive range of motion;Visual/perceptual remediation/compensation;Patient/family education;Energy conservation;Manual Therapy    Plan  simple organization tasks, visual scanning    Recommended Other Services  schedule for PT eval; current with ST    Consulted and Agree with Plan of Care  Patient;Family member/caregiver       Patient will benefit from skilled therapeutic intervention in order to improve the following deficits and impairments:  Decreased cognition, Impaired vision/preception, Decreased coordination, Impaired perceived functional ability, Decreased safety awareness, Decreased knowledge of precautions  Visit Diagnosis: Attention and concentration deficit  Frontal lobe and executive function deficit  Visuospatial deficit  Neurologic neglect syndrome  Unsteadiness on feet  Other symptoms and signs involving cognitive functions following cerebral infarction  Other abnormalities of gait and mobility    Problem List Patient Active Problem List   Diagnosis Date Noted  . Hypoglycemia   . Uncontrolled hypertension   . Hypertensive urgency   . Hypertensive crisis   . Acute blood loss anemia   . Hypokalemia   . Acute ischemic left MCA stroke ([place]) 07/12/2017  . Diabetes mellitus type 2 in obese ([place])   . Stage 3 chronic kidney disease ([place])   . Benign essential HTN   . Left middle cerebral artery stroke ([place]) 07/09/2017  . Acute embolic stroke  ([place])   . Aphasia 07/08/2017  . Gout 07/21/2011  . Prostatitis, chronic 07/14/2010  . RENAL INSUFFICIENCY 02/05/2008  . Hyperlipidemia 06/18/2007  . DM type 2 (diabetes mellitus, type 2) ([place]) 01/02/2007  . Hypertensive kidney disease 01/02/2007    [facility] 08/07/2017, 5:10 PM  [facility] [address] [place], [place], [address] Phone: [phone]   Fax:  [phone]  Name: [name] [name] MRN: [id number] Date of Birth: 26-Mar-1946   Vianne Bulls, OTR/L [facility] [phone]  [place] [place], [place]  [address] [phone] phone [phone] 08/07/17 5:13 PM

## 2017-08-07 NOTE — Therapy (Signed)
[facility] [address] [place], [place], [address] Phone: [phone]   Fax:  [phone]  Speech Language Pathology Treatment  Patient Details  Name: [name] [name] MRN: [id number] Date of Birth: June 02, 1946 Referring Provider: Jamse Arn, MD   Encounter Date: 08/07/2017  End of Session - 08/07/17 1307    Visit Number  4    Number of Visits  17    Date for SLP Re-Evaluation  09/22/17    SLP Start Time  0850    SLP Stop Time   0929    SLP Time Calculation (min)  39 min    Activity Tolerance  Patient tolerated treatment well       Past Medical History:  Diagnosis Date  . DIABETES MELLITUS, TYPE II 01/02/2007  . HYPERLIPIDEMIA 06/18/2007  . HYPERTENSION 01/02/2007  . RENAL INSUFFICIENCY 02/05/2008    Past Surgical History:  Procedure Laterality Date  . LOOP RECORDER INSERTION N/A 07/11/2017   Procedure: LOOP RECORDER INSERTION;  Surgeon: Thompson Grayer, MD;  Location: [facility];  Service: Cardiovascular;  Laterality: N/A;  . SHOULDER SURGERY     left  . TEE WITHOUT CARDIOVERSION N/A 07/11/2017   Procedure: TRANSESOPHAGEAL ECHOCARDIOGRAM (TEE);  Surgeon: Dorothy Spark, MD;  Location[facility]COPY;  Service: Cardiovascular;  Laterality: N/A;  . TRANSURETHRAL RESECTION OF PROSTATE     history of retention/hematuria     There were no vitals filed for this visit.  Subjective Assessment - 08/07/17 0852    Subjective  "I don't remember the test"    Currently in Pain?  No/denies            ADULT SLP TREATMENT - 08/07/17 0853      General Information   Behavior/Cognition  Alert;Cooperative;Pleasant mood    Patient Positioning  Upright in chair      Treatment Provided   Treatment provided  Cognitive-Linquistic      Pain Assessment   Pain Assessment  No/denies pain      Cognitive-Linquistic Treatment   Treatment focused on  Cognition    Skilled Treatment  SLP worked with pt on simple  language tasks (omitting letters to create new words); pt initially required usual mod A, with support fading to rare min A. SLP targeted pt's attention and reading comprehension with task for following simple-mod complex written directions. Pt required usual mod A for attention to detail, following specific instructions. In 7 minutes conversation, pt frequently looking to wife to when SLP requested more specific details, rephrasing by SLP was necessary x2 for comprehension.       Assessment / Recommendations / Plan   Plan  Continue with current plan of care      Progression Toward Goals   Progression toward goals  Progressing toward goals       SLP Education - 08/07/17 1306    Education provided  Yes    Education Details  SLP demo'd question cues vs providing word/details when pt looking to wife for assistance in simple-mod complex conversation    Person(s) Educated  Patient;Spouse    Methods  Explanation;Demonstration;Verbal cues    Comprehension  Verbalized understanding;Need further instruction       SLP Short Term Goals - 08/07/17 1307      SLP SHORT TERM GOAL #1   Title  pt will demonstrate word finding compensations in 7 minutes simple conversation with rare min A for their use    Time  2  Period  Weeks    Status  On-going      SLP SHORT TERM GOAL #2   Title  pt will undergo cognitvie linguistic testing and goals to be added PRN    Period  Weeks    Status  Achieved      SLP SHORT TERM GOAL #3   Title  pt will achieve 85% success with simple word finding tasks over 3 sessions    Time  2    Period  Weeks    Status  On-going      SLP SHORT TERM GOAL #4   Title  pt will demo understanding of mod complex directions 85% success over three sessions    Time  2    Period  Weeks    Status  On-going      SLP SHORT TERM GOAL #5   Title  pt will indicate 3 cognitive deficits in 2 therapy sessions    Time  2    Period  Weeks    Status  On-going      SLP SHORT TERM GOAL #6    Title  pt will demonstrate appropriate selective attention x4, in 5 minutes of therapy tasks, in 2 sessions    Time  2    Period  Weeks    Status  On-going      SLP SHORT TERM GOAL #7   Title  pt will initiate use of memory enhancing system and bring to ST during 6 therapy sessions    Time  2    Period  Weeks    Status  On-going       SLP Long Term Goals - 08/07/17 1308      SLP LONG TERM GOAL #1   Title  pt will functionally use compensatory strategies for anomia in 8 minutes mod complex conversation    Time  6    Period  Weeks    Status  On-going      SLP LONG TERM GOAL #2   Title  pt will complete mod complex naming tasks with 90% success with modified independence (use of compensations)    Time  6    Period  Weeks    Status  On-going      SLP LONG TERM GOAL #3   Title  pt will demo functional comprehension in 10 minutes mod complex-complex convresation over three sessions    Time  6    Period  Weeks    Status  On-going      SLP LONG TERM GOAL #4   Title  pt will demo selective attention x3 in 10 minutes of therapy tasks in a mod noisy environment     Time  6    Period  Weeks    Status  On-going      SLP LONG TERM GOAL #5   Title  pt will exhibit functional emergent awareness in mod complex cognitive linguistic therapy tasks    Time  6    Period  Weeks    Status  On-going      SLP LONG TERM GOAL #7   Title  pt will use memory enhancement system appropriately in or between 7 therapy sessions    Time  6    Period  Weeks    Status  On-going       Plan - 08/07/17 1311    Clinical Impression Statement  Pt presents today with anomia, vague speech and pausing/hesitation during simple-mod complex conversation. Deficits  seen in attention to detail for following written instructions. Pt would benefit from skilled ST to address expressive language and cognitive linguistic deficits.    Speech Therapy Frequency  2x / week    Treatment/Interventions  Language  facilitation;Environmental controls;Cueing hierarchy;SLP instruction and feedback;Compensatory strategies;Patient/family education;Multimodal communcation approach;Internal/external aids;Cognitive reorganization;Functional tasks    Potential to Achieve Goals  Good    Potential Considerations  Severity of impairments    Consulted and Agree with Plan of Care  Patient;Family member/caregiver    Family Member Consulted  wife       Patient will benefit from skilled therapeutic intervention in order to improve the following deficits and impairments:   Aphasia  Cognitive communication deficit    Problem List Patient Active Problem List   Diagnosis Date Noted  . Hypoglycemia   . Uncontrolled hypertension   . Hypertensive urgency   . Hypertensive crisis   . Acute blood loss anemia   . Hypokalemia   . Acute ischemic left MCA stroke ([place]) 07/12/2017  . Diabetes mellitus type 2 in obese ([place])   . Stage 3 chronic kidney disease ([place])   . Benign essential HTN   . Left middle cerebral artery stroke ([place]) 07/09/2017  . Acute embolic stroke ([place])   . Aphasia 07/08/2017  . Gout 07/21/2011  . Prostatitis, chronic 07/14/2010  . RENAL INSUFFICIENCY 02/05/2008  . Hyperlipidemia 06/18/2007  . DM type 2 (diabetes mellitus, type 2) ([place]) 01/02/2007  . Hypertensive kidney disease 01/02/2007   Deneise Lever, [place], [place] 08/07/2017, 1:11 PM  [facility] [address] [place], [place], [address] Phone: [phone]   Fax:  [phone]   Name: RAYQUON USELMAN MRN: [id number] Date of Birth: 14-May-1946

## 2017-08-08 ENCOUNTER — Telehealth: Payer: Self-pay | Admitting: Family Medicine

## 2017-08-08 NOTE — Telephone Encounter (Signed)
Received FMLA form from [employer].  [name] filled out.  I called the pt to inform her that paper work is now ready for pick up.  [name] [name] has informed me that she is now retired and does not need paper work.  Will recycle form.

## 2017-08-10 ENCOUNTER — Ambulatory Visit: Payer: Medicare Other

## 2017-08-10 ENCOUNTER — Ambulatory Visit (INDEPENDENT_AMBULATORY_CARE_PROVIDER_SITE_OTHER): Payer: Medicare Other | Admitting: *Deleted

## 2017-08-10 ENCOUNTER — Ambulatory Visit: Payer: Medicare Other | Admitting: Speech Pathology

## 2017-08-10 ENCOUNTER — Ambulatory Visit: Payer: Medicare Other | Admitting: Occupational Therapy

## 2017-08-10 DIAGNOSIS — R41841 Cognitive communication deficit: Secondary | ICD-10-CM

## 2017-08-10 DIAGNOSIS — I639 Cerebral infarction, unspecified: Secondary | ICD-10-CM | POA: Diagnosis not present

## 2017-08-10 DIAGNOSIS — R41840 Attention and concentration deficit: Secondary | ICD-10-CM | POA: Diagnosis not present

## 2017-08-10 DIAGNOSIS — R41842 Visuospatial deficit: Secondary | ICD-10-CM

## 2017-08-10 DIAGNOSIS — R4701 Aphasia: Secondary | ICD-10-CM

## 2017-08-10 DIAGNOSIS — R41844 Frontal lobe and executive function deficit: Secondary | ICD-10-CM

## 2017-08-10 NOTE — Progress Notes (Signed)
[facility] Summary Report / Loop Recorder 

## 2017-08-10 NOTE — Therapy (Signed)
[facility] [address] [place], [place], [address] Phone: [phone]   Fax:  [phone]  Occupational Therapy Treatment  Patient Details  Name: [name] [name] MRN: [id number] Date of Birth: 03-30-1946 Referring Provider: Reesa Chew, PA-C (f/u with Dr. Posey Pronto)   Encounter Date: 08/10/2017  OT End of Session - 08/10/17 0935    Visit Number  6    Number of Visits  17    Date for OT Re-Evaluation  09/15/17    Authorization Type  [employer]HC Medicare    Authorization Time Period  cert. date 07/17/17-09/15/17    OT Start Time  0933    OT Stop Time  1015    OT Time Calculation (min)  42 min    Activity Tolerance  Patient tolerated treatment well    Behavior During Therapy  [facility] for tasks assessed/performed       Past Medical History:  Diagnosis Date  . DIABETES MELLITUS, TYPE II 01/02/2007  . HYPERLIPIDEMIA 06/18/2007  . HYPERTENSION 01/02/2007  . RENAL INSUFFICIENCY 02/05/2008    Past Surgical History:  Procedure Laterality Date  . LOOP RECORDER INSERTION N/A 07/11/2017   Procedure: LOOP RECORDER INSERTION;  Surgeon: Thompson Grayer, MD;  Location: [facility];  Service: Cardiovascular;  Laterality: N/A;  . SHOULDER SURGERY     left  . TEE WITHOUT CARDIOVERSION N/A 07/11/2017   Procedure: TRANSESOPHAGEAL ECHOCARDIOGRAM (TEE);  Surgeon: Dorothy Spark, MD;  Location: [facility] ENDOSCOPY;  Service: Cardiovascular;  Laterality: N/A;  . TRANSURETHRAL RESECTION OF PROSTATE     history of retention/hematuria     There were no vitals filed for this visit.  Subjective Assessment - 08/10/17 0934    Pertinent History  L middle cerebral artery stroke; DM, hyperlipidemia, renal insufficiency, HTN, hx L shoulder surgery, loop recorder    Patient Stated Goals  improve speech, improve R side attention (needed prompting)    Currently in Pain?  No/denies         Treatment: organizing your day basic: Max difficulty and v.c pt was unable  to determine correct start time, and he missed other important details, pt also repeated an item. 1.5 M number copying task:100% accuracy using a line guide,  Number cancellation task to count the number of times an item repeats in a line with increased time using line guide and only 1 error.                     OT Short Term Goals - 07/17/17 1521      OT SHORT TERM GOAL #1   Title  Pt will be independent with HEP for cognitive/visual deficits.--check STGs 08/18/17    Time  4    Period  Weeks    Status  New      OT SHORT TERM GOAL #2   Title  Pt will perform complex tabletop visual scanning with at least 95% accuracy.    Time  4    Period  Weeks    Status  New      OT SHORT TERM GOAL #3   Title  Pt will attend to functional task for at least 52min in busy environment.    Time  4    Period  Weeks    Status  New      OT SHORT TERM GOAL #4   Title  Pt will perform mod complex organization tasks with at least 75% accuracy.    Time  4  Period  Weeks    Status  New        OT Long Term Goals - 07/17/17 1523      OT LONG TERM GOAL #1   Title  Pt will perform mod complex environmental scanning with at least 90% accuracy.    Time  8    Period  Weeks    Status  New      OT LONG TERM GOAL #2   Title  Pt will perform mod complex organization tasks with at least 90% accuracy.    Time  8    Period  Weeks    Status  New      OT LONG TERM GOAL #3   Title  Pt will divide attention between cognitive and physical task with at least 85% accuracy for improved safety with community activities/in prep for driving.    Time  8    Period  Weeks    Status  New              Patient will benefit from skilled therapeutic intervention in order to improve the following deficits and impairments:     Visit Diagnosis: Attention and concentration deficit  Frontal lobe and executive function deficit  Visuospatial deficit    Problem List Patient Active Problem  List   Diagnosis Date Noted  . Hypoglycemia   . Uncontrolled hypertension   . Hypertensive urgency   . Hypertensive crisis   . Acute blood loss anemia   . Hypokalemia   . Acute ischemic left MCA stroke ([place]) 07/12/2017  . Diabetes mellitus type 2 in obese ([place])   . Stage 3 chronic kidney disease ([place])   . Benign essential HTN   . Left middle cerebral artery stroke ([place]) 07/09/2017  . Acute embolic stroke ([place])   . Aphasia 07/08/2017  . Gout 07/21/2011  . Prostatitis, chronic 07/14/2010  . RENAL INSUFFICIENCY 02/05/2008  . Hyperlipidemia 06/18/2007  . DM type 2 (diabetes mellitus, type 2) ([place]) 01/02/2007  . Hypertensive kidney disease 01/02/2007    Chidiebere Wynn 08/10/2017, 9:41 AM  [facility] [address] [place], [place], [address] Phone: [phone]   Fax:  [phone]  Name: [name] [name] MRN: [id number] Date of Birth: 25-Oct-1945

## 2017-08-10 NOTE — Therapy (Signed)
[facility] [address] [place], [place], [address] Phone: [phone]   Fax:  [phone]  Speech Language Pathology Treatment  Patient Details  Name: [name] [name] MRN: [id number] Date of Birth: 01-May-1946 Referring Provider: Jamse Arn, MD   Encounter Date: 08/10/2017  End of Session - 08/10/17 0952    Visit Number  5    Number of Visits  17    Date for SLP Re-Evaluation  09/22/17    SLP Start Time  0806    SLP Stop Time   0845    SLP Time Calculation (min)  39 min    Activity Tolerance  Patient tolerated treatment well       Past Medical History:  Diagnosis Date  . DIABETES MELLITUS, TYPE II 01/02/2007  . HYPERLIPIDEMIA 06/18/2007  . HYPERTENSION 01/02/2007  . RENAL INSUFFICIENCY 02/05/2008    Past Surgical History:  Procedure Laterality Date  . LOOP RECORDER INSERTION N/A 07/11/2017   Procedure: LOOP RECORDER INSERTION;  Surgeon: Thompson Grayer, MD;  Location: [facility];  Service: Cardiovascular;  Laterality: N/A;  . SHOULDER SURGERY     left  . TEE WITHOUT CARDIOVERSION N/A 07/11/2017   Procedure: TRANSESOPHAGEAL ECHOCARDIOGRAM (TEE);  Surgeon: Dorothy Spark, MD;  Location: [facility];  Service: Cardiovascular;  Laterality: N/A;  . TRANSURETHRAL RESECTION OF PROSTATE     history of retention/hematuria     There were no vitals filed for this visit.  Subjective Assessment - 08/10/17 0807    Subjective  Pt reports he worked on homework; wife reports "trouble focusing"    Currently in Pain?  No/denies            ADULT SLP TREATMENT - 08/10/17 0806      General Information   Behavior/Cognition  Alert;Cooperative;Pleasant mood    Patient Positioning  Upright in chair      Treatment Provided   Treatment provided  Cognitive-Linquistic      Pain Assessment   Pain Assessment  No/denies pain      Cognitive-Linquistic Treatment   Treatment focused on  Cognition    Skilled  Treatment  Pt, wife report he completed homework tasks but left them at home. SLP worked with pt on following specific directions. Slow processing noted. SLP educated and demonstrated strategies to improve attention and processing, including use of visual cues, rephrasing instructions, repeating/verbalizing instructions during the task. Pt required frequent min-mod A for attention to detail. Selective attention to task maintained for 4-5 minute intervals, with pt requiring usual min A to implement attention strategies. Comprehension of mod-complex instructions was 75%. In simple conversation re: family vacations, pt paused to look at wife when asked simple question. Pt required usual min A question cues to provide specific information.      Assessment / Recommendations / Plan   Plan  Continue with current plan of care      Progression Toward Goals   Progression toward goals  Progressing toward goals       SLP Education - 08/10/17 0951    Education provided  Yes    Education Details  use visual reminders, verbalize instructions to improve focus    Person(s) Educated  Patient;Spouse    Methods  Explanation;Demonstration;Verbal cues    Comprehension  Verbalized understanding;Need further instruction;Returned demonstration;Verbal cues required       SLP Short Term Goals - 08/10/17 0941      SLP SHORT TERM GOAL #1   Title  pt will  demonstrate word finding compensations in 7 minutes simple conversation with rare min A for their use    Time  2    Period  Weeks    Status  On-going      SLP SHORT TERM GOAL #2   Title  pt will undergo cognitvie linguistic testing and goals to be added PRN    Status  Achieved      SLP SHORT TERM GOAL #3   Title  pt will achieve 85% success with simple word finding tasks over 3 sessions    Time  2    Period  Weeks    Status  On-going      SLP SHORT TERM GOAL #4   Title  pt will demo understanding of mod complex directions 85% success over three sessions     Time  2    Period  Weeks    Status  On-going      SLP SHORT TERM GOAL #5   Title  pt will indicate 3 cognitive deficits in 2 therapy sessions    Time  2    Period  Weeks    Status  On-going      SLP SHORT TERM GOAL #6   Title  pt will demonstrate appropriate selective attention x4, in 5 minutes of therapy tasks, in 2 sessions    Time  2    Period  Weeks    Status  On-going      SLP SHORT TERM GOAL #7   Title  pt will initiate use of memory enhancing system and bring to ST during 6 therapy sessions    Time  2    Period  Weeks    Status  On-going       SLP Long Term Goals - 08/10/17 1001      SLP LONG TERM GOAL #1   Title  pt will functionally use compensatory strategies for anomia in 8 minutes mod complex conversation    Time  6    Period  Weeks    Status  On-going      SLP LONG TERM GOAL #2   Title  pt will complete mod complex naming tasks with 90% success with modified independence (use of compensations)    Time  6    Period  Weeks    Status  On-going      SLP LONG TERM GOAL #3   Title  pt will demo functional comprehension in 10 minutes mod complex-complex convresation over three sessions    Time  6    Period  Weeks    Status  On-going      SLP LONG TERM GOAL #4   Title  pt will demo selective attention x3 in 10 minutes of therapy tasks in a mod noisy environment     Time  6    Period  Weeks    Status  On-going      SLP LONG TERM GOAL #5   Title  pt will exhibit functional emergent awareness in mod complex cognitive linguistic therapy tasks    Time  6    Period  Weeks    Status  On-going      SLP LONG TERM GOAL #7   Title  pt will use memory enhancement system appropriately in or between 7 therapy sessions    Time  6    Period  Weeks    Status  On-going       Plan - 08/10/17 3875  Clinical Impression Statement  Pt presents today with anomia, vague speech and pausing/hesitation during simple-mod complex conversation. Deficits seen today include   following written/verbal instruction, attention to detail, slow processing. See skilled intervention for details. Pt would benefit from skilled ST to address expressive language and cognitive linguistic deficits.    Speech Therapy Frequency  2x / week    Treatment/Interventions  Language facilitation;Environmental controls;Cueing hierarchy;SLP instruction and feedback;Compensatory strategies;Patient/family education;Multimodal communcation approach;Internal/external aids;Cognitive reorganization;Functional tasks    Potential to Achieve Goals  Good    Potential Considerations  Severity of impairments    Consulted and Agree with Plan of Care  Patient;Family member/caregiver    Family Member Consulted  wife       Patient will benefit from skilled therapeutic intervention in order to improve the following deficits and impairments:   Cognitive communication deficit  Aphasia    Problem List Patient Active Problem List   Diagnosis Date Noted  . Hypoglycemia   . Uncontrolled hypertension   . Hypertensive urgency   . Hypertensive crisis   . Acute blood loss anemia   . Hypokalemia   . Acute ischemic left MCA stroke ([place]) 07/12/2017  . Diabetes mellitus type 2 in obese ([place])   . Stage 3 chronic kidney disease ([place])   . Benign essential HTN   . Left middle cerebral artery stroke ([place]) 07/09/2017  . Acute embolic stroke ([place])   . Aphasia 07/08/2017  . Gout 07/21/2011  . Prostatitis, chronic 07/14/2010  . RENAL INSUFFICIENCY 02/05/2008  . Hyperlipidemia 06/18/2007  . DM type 2 (diabetes mellitus, type 2) ([place]) 01/02/2007  . Hypertensive kidney disease 01/02/2007   Deneise Lever, [place], [place] 08/10/2017, 10:01 AM  [facility] [address] [place], [place], [address] Phone: [phone]   Fax:  [phone]   Name: ALANZO LAMB MRN: [id number] Date of Birth: 1945-10-09

## 2017-08-14 ENCOUNTER — Ambulatory Visit: Payer: Medicare Other | Admitting: Occupational Therapy

## 2017-08-14 ENCOUNTER — Encounter: Payer: Self-pay | Admitting: Rehabilitation

## 2017-08-14 ENCOUNTER — Ambulatory Visit: Payer: Medicare Other | Admitting: Rehabilitation

## 2017-08-14 ENCOUNTER — Encounter: Payer: Self-pay | Admitting: Occupational Therapy

## 2017-08-14 ENCOUNTER — Ambulatory Visit: Payer: Medicare Other

## 2017-08-14 DIAGNOSIS — R4701 Aphasia: Secondary | ICD-10-CM

## 2017-08-14 DIAGNOSIS — R41840 Attention and concentration deficit: Secondary | ICD-10-CM

## 2017-08-14 DIAGNOSIS — R41841 Cognitive communication deficit: Secondary | ICD-10-CM

## 2017-08-14 DIAGNOSIS — R2681 Unsteadiness on feet: Secondary | ICD-10-CM

## 2017-08-14 DIAGNOSIS — R2689 Other abnormalities of gait and mobility: Secondary | ICD-10-CM

## 2017-08-14 DIAGNOSIS — R41842 Visuospatial deficit: Secondary | ICD-10-CM

## 2017-08-14 DIAGNOSIS — R414 Neurologic neglect syndrome: Secondary | ICD-10-CM

## 2017-08-14 DIAGNOSIS — I69318 Other symptoms and signs involving cognitive functions following cerebral infarction: Secondary | ICD-10-CM

## 2017-08-14 DIAGNOSIS — R41844 Frontal lobe and executive function deficit: Secondary | ICD-10-CM

## 2017-08-14 NOTE — Therapy (Signed)
[facility] [address] [place], [place], [address] Phone: [phone]   Fax:  [phone]  Occupational Therapy Treatment  Patient Details  Name: [name] [name] MRN: [id number] Date of Birth: July 24, 1945 Referring Provider: Reesa Chew, PA-C (f/u with Dr. Posey Pronto)   Encounter Date: 08/14/2017  OT End of Session - 08/14/17 0811    Visit Number  7    Number of Visits  17    Date for OT Re-Evaluation  09/15/17    Authorization Type  UHC Medicare    Authorization Time Period  cert. date 07/17/17-09/15/17    OT Start Time  0807    OT Stop Time  0846    OT Time Calculation (min)  39 min    Activity Tolerance  Patient tolerated treatment well    Behavior During Therapy  [facility] for tasks assessed/performed       Past Medical History:  Diagnosis Date  . DIABETES MELLITUS, TYPE II 01/02/2007  . HYPERLIPIDEMIA 06/18/2007  . HYPERTENSION 01/02/2007  . RENAL INSUFFICIENCY 02/05/2008    Past Surgical History:  Procedure Laterality Date  . LOOP RECORDER INSERTION N/A 07/11/2017   Procedure: LOOP RECORDER INSERTION;  Surgeon: Thompson Grayer, MD;  Location: [facility];  Service: Cardiovascular;  Laterality: N/A;  . SHOULDER SURGERY     left  . TEE WITHOUT CARDIOVERSION N/A 07/11/2017   Procedure: TRANSESOPHAGEAL ECHOCARDIOGRAM (TEE);  Surgeon: Dorothy Spark, MD;  Location: [facility];  Service: Cardiovascular;  Laterality: N/A;  . TRANSURETHRAL RESECTION OF PROSTATE     history of retention/hematuria     There were no vitals filed for this visit.  Subjective Assessment - 08/14/17 0810    Subjective   nothing new    Pertinent History  L middle cerebral artery stroke; DM, hyperlipidemia, renal insufficiency, HTN, hx L shoulder surgery, loop recorder    Patient Stated Goals  improve speech, improve R side attention (needed prompting)    Currently in Pain?  No/denies        Spot It with occasional min difficulty for  visual scanning and attention.     Constant Therapy: Instruction Sequencing:  Level 1, with 38% accuracy (min v.c.) with 81.67sec average response time.  On 2nd attempt, level 1, with 76% accuracy with 53.36 sec average response time.  (for sequencing/organization of ADL activities).  Pattern Recreation:  Level 1, with 87% accuracy (min-mod v.c. Initially) with 30.98sec average response time.  Improved with repetition.     (for visual scanning, memory, attention, sequencing)    Environmental scanning with approx 79% accuracy initially, and found remaining items on 2nd pass.  Missed items located on R side.                     OT Short Term Goals - 08/14/17 0819      OT SHORT TERM GOAL #1   Title  Pt will be independent with HEP for cognitive/visual deficits.--check STGs 08/18/17    Time  4    Period  Weeks    Status  Achieved      OT SHORT TERM GOAL #2   Title  Pt will perform complex tabletop visual scanning with at least 95% accuracy.    Time  4    Period  Weeks    Status  New      OT SHORT TERM GOAL #3   Title  Pt will attend to functional task for at least 57min in busy  environment.    Time  4    Period  Weeks    Status  Achieved      OT SHORT TERM GOAL #4   Title  Pt will perform mod complex organization tasks with at least 75% accuracy.    Time  4    Period  Weeks    Status  New        OT Long Term Goals - 07/17/17 1523      OT LONG TERM GOAL #1   Title  Pt will perform mod complex environmental scanning with at least 90% accuracy.    Time  8    Period  Weeks    Status  New      OT LONG TERM GOAL #2   Title  Pt will perform mod complex organization tasks with at least 90% accuracy.    Time  8    Period  Weeks    Status  New      OT LONG TERM GOAL #3   Title  Pt will divide attention between cognitive and physical task with at least 85% accuracy for improved safety with community activities/in prep for driving.    Time  8    Period  Weeks     Status  New            Plan - 08/14/17 0867    Clinical Impression Statement  Pt is progressing with attention and visual scanning, but continues to demo difficulty with organization.  Improved with repetition.    Rehab Potential  Good    OT Frequency  2x / week    OT Duration  8 weeks    OT Treatment/Interventions  Self-care/ADL training;Moist Heat;DME and/or AE instruction;Therapeutic activities;Cognitive remediation/compensation;Therapeutic exercise;Neuromuscular education;Cryotherapy;Functional Mobility Training;Passive range of motion;Visual/perceptual remediation/compensation;Patient/family education;Energy conservation;Manual Therapy    Plan  continue to address visual scanning and cognition in a functional context; check remaining STGs.    Recommended Other Services  schedule for PT eval; current with ST    Consulted and Agree with Plan of Care  Patient;Family member/caregiver       Patient will benefit from skilled therapeutic intervention in order to improve the following deficits and impairments:  Decreased cognition, Impaired vision/preception, Decreased coordination, Impaired perceived functional ability, Decreased safety awareness, Decreased knowledge of precautions  Visit Diagnosis: Attention and concentration deficit  Frontal lobe and executive function deficit  Visuospatial deficit  Neurologic neglect syndrome  Other symptoms and signs involving cognitive functions following cerebral infarction    Problem List Patient Active Problem List   Diagnosis Date Noted  . Hypoglycemia   . Uncontrolled hypertension   . Hypertensive urgency   . Hypertensive crisis   . Acute blood loss anemia   . Hypokalemia   . Acute ischemic left MCA stroke ([place]) 07/12/2017  . Diabetes mellitus type 2 in obese ([place])   . Stage 3 chronic kidney disease ([place])   . Benign essential HTN   . Left middle cerebral artery stroke ([place]) 07/09/2017  . Acute embolic stroke ([place])   .  Aphasia 07/08/2017  . Gout 07/21/2011  . Prostatitis, chronic 07/14/2010  . RENAL INSUFFICIENCY 02/05/2008  . Hyperlipidemia 06/18/2007  . DM type 2 (diabetes mellitus, type 2) ([place]) 01/02/2007  . Hypertensive kidney disease 01/02/2007    [facility] 08/14/2017, 12:17 PM  [facility] [address] [place], [place], [address] Phone: [phone]   Fax:  [phone]  Name: [name] [name] MRN: [id number] Date  of Birth: May 15, 1946   Vianne Bulls, OTR/L [facility] [address] [place], [place]  [address] [phone] phone [phone] 08/14/17 12:17 PM

## 2017-08-14 NOTE — Therapy (Signed)
[facility] [address] [place], [place], [address] Phone: [phone]   Fax:  [phone]  Physical Therapy Treatment  Patient Details  Name: [name] [name] MRN: [id number] Date of Birth: 1945-07-17 Referring Provider: Reesa Chew, PA-C (f/u with Dr. Posey Pronto)   Encounter Date: 08/14/2017  PT End of Session - 08/14/17 0853    Visit Number  4    Number of Visits  6    Date for PT Re-Evaluation  09/02/17    Authorization Type  [employer] medicare $40 copay for PT and ST combined    PT Start Time  0847    PT Stop Time  0930    PT Time Calculation (min)  43 min    Activity Tolerance  Patient tolerated treatment well    Behavior During Therapy  [facility] for tasks assessed/performed minimal verbal interaction due to speech deficits       Past Medical History:  Diagnosis Date  . DIABETES MELLITUS, TYPE II 01/02/2007  . HYPERLIPIDEMIA 06/18/2007  . HYPERTENSION 01/02/2007  . RENAL INSUFFICIENCY 02/05/2008    Past Surgical History:  Procedure Laterality Date  . LOOP RECORDER INSERTION N/A 07/11/2017   Procedure: LOOP RECORDER INSERTION;  Surgeon: Thompson Grayer, MD;  Location: [facility];  Service: Cardiovascular;  Laterality: N/A;  . SHOULDER SURGERY     left  . TEE WITHOUT CARDIOVERSION N/A 07/11/2017   Procedure: TRANSESOPHAGEAL ECHOCARDIOGRAM (TEE);  Surgeon: Dorothy Spark, MD;  Location[facility];  Service: Cardiovascular;  Laterality: N/A;  . TRANSURETHRAL RESECTION OF PROSTATE     history of retention/hematuria     There were no vitals filed for this visit.  Subjective Assessment - 08/14/17 1024    Subjective  Reports things are going well.  Wife reports he is doing more at home, going upstairs to shower.      Pertinent History  diabetes, renal insufficiency, HTN, h/o L shoulder surgery     Patient Stated Goals  Unsure- hard to vocalize due to speech difficulties.     Currently in Pain?  No/denies                       [facility] Adult PT Treatment/Exercise - 08/14/17 0911      Ambulation/Gait   Stairs  Yes    Stairs Assistance  5: Supervision;6: Modified independent (Device/Increase time)    Stairs Assistance Details (indicate cue type and reason)  Performed stairs to better assess safety at home. Pt able to perform 12 stairs with single rail at mod I level.  Then had pt place object under arm to simulate carrying laundry up/down stairs.  also able to do at mod I level.  Then had pt carry 10 lb weighted crate up/down x 12 steps.  Recommend S for this task as he had slight instability no overt LOB.      Stair Management Technique  One rail Right;Alternating pattern;Forwards;No rails    Number of Stairs  12 x3 reps    Height of Stairs  6      Neuro Re-ed    Neuro Re-ed Details   High level balance/gait; standing on red therapy mat marching forwards/backwards x 2 reps (approx 18' each), walking tandem forwards/backwards x 2 laps. Walking along red and blue therapy mat x 4 reps with weights underneath to better simulate outdoor gait.  Progressed tasks with head turns horiztonally x 2 reps (over uneven mats)>vertical head turns x 2 laps over  mats and tossing ball x 2 reps down and back.  No overt LOB, mild sway noted.  Wall bumps with feet on foam balance beam shoulder width apart x 5 reps>feet together x 5 reps with second hold with max fading to min cues for increased hip extension to improve postural control.               PT Education - 08/14/17 1248    Education provided  Yes    Education Details  pt/wife to bring in HEP at next visit to update/modify as needed     Person(s) Educated  Patient;Spouse    Methods  Explanation    Comprehension  Verbalized understanding       PT Short Term Goals - 07/19/17 1044      PT SHORT TERM GOAL #1   Title  The patient will return demo HEP for high level balance and dynamic gait activities.    Time  4    Period  Weeks    Target  Date  08/18/17      PT SHORT TERM GOAL #2   Title  The patient will improve FGA from 21/30 to > or equal to 24/30 to demonstrate improved dynamic gait abilities for community mobility.    Time  4    Period  Weeks    Target Date  08/18/17      PT SHORT TERM GOAL #3   Title  The patient will return to modified [facility] routine [facility] [employer] (initially recommending walking on indoor track) to include cardio, strengthening.    Time  4    Period  Weeks    Target Date  08/18/17      PT SHORT TERM GOAL #4   Title  --        PT Long Term Goals - 07/19/17 1046      PT LONG TERM GOAL #1   Title  The patient will be independent with post d/c wellness program.    Time  6    Period  Weeks    Target Date  09/02/17      PT LONG TERM GOAL #2   Title  The patient will improve FGA from 21/30 to > or equal to 26/30 to demonstrate improving dynamic mobility.    Time  6    Period  Weeks    Target Date  09/02/17      PT LONG TERM GOAL #3   Title  The patient will improve Berg from 49/56 to > or equal to 53/56 to demonstrate improved narrow base of support balance/    Time  6    Period  Weeks    Target Date  09/02/17            Plan - 08/14/17 1249    Clinical Impression Statement  Session focused on high level balance and stair training to increase independence.  Pt making excellent progress from a mobility standpoint and feel he will be okay to wrap up in next two visits.  Pt to bring HEP to next visit to update as needed and D/C visit after this.  Pt and wife verbalized understanding.     Rehab Potential  Good    PT Frequency  1x / week    PT Duration  6 weeks    PT Treatment/Interventions  ADLs/Self Care Home Management;Balance training;Neuromuscular re-education;Patient/family education;Gait training;Functional mobility training;Therapeutic activities;Therapeutic exercise    PT Next Visit Plan  Raquel Sarna went over walking program,  but forgot to give them handout, go over HEP and update as  needed to d/C on next visit,  further assess R inattention with gait in busy environments    Consulted and Agree with Plan of Care  Patient;Family member/caregiver    Family Member Consulted  spouse       Patient will benefit from skilled therapeutic intervention in order to improve the following deficits and impairments:  Decreased activity tolerance, Decreased balance, Difficulty walking  Visit Diagnosis: Unsteadiness on feet  Other abnormalities of gait and mobility     Problem List Patient Active Problem List   Diagnosis Date Noted  . Hypoglycemia   . Uncontrolled hypertension   . Hypertensive urgency   . Hypertensive crisis   . Acute blood loss anemia   . Hypokalemia   . Acute ischemic left MCA stroke ([place]) 07/12/2017  . Diabetes mellitus type 2 in obese ([place])   . Stage 3 chronic kidney disease ([place])   . Benign essential HTN   . Left middle cerebral artery stroke ([place]) 07/09/2017  . Acute embolic stroke ([place])   . Aphasia 07/08/2017  . Gout 07/21/2011  . Prostatitis, chronic 07/14/2010  . RENAL INSUFFICIENCY 02/05/2008  . Hyperlipidemia 06/18/2007  . DM type 2 (diabetes mellitus, type 2) ([place]) 01/02/2007  . Hypertensive kidney disease 01/02/2007    Cameron Sprang, PT, MPT [facility] [address] [place], [place], [address] Phone: [phone]   Fax:  [phone] 08/14/17, 12:52 PM  Name: [name] [name] MRN: [id number] Date of Birth: 05-Apr-1946

## 2017-08-14 NOTE — Patient Instructions (Signed)
WALKING  Walking is a great form of exercise to increase your strength, endurance and overall fitness.  A walking program can help you start slowly and gradually build endurance as you go.  Everyone's ability is different, so each person's starting point will be different.  You do not have to follow them exactly.  The are just samples. You should simply find out what's right for you and stick to that program.   In the beginning, you'll start off walking 2-3 times a day for short distances.  As you get stronger, you'll be walking further at just 1-2 times per day.  A. You Can Walk For A Certain Length Of Time Each Day    Walk 10 minutes 2-3 times per day.  Increase 1-2 minutes every 7 days.  Work up to 25-30 minutes (1-2 times per day).   Example:   Day 1-2 10 minutes 3 times per day   Day 7-8 11 minutes 2-3 times per day   Day 13-14 12 minutes 1-2 times per day  B. You Can Walk For a Certain Distance Each Day     Distance can be substituted for time.    Example:   3 trips to mailbox (at road)   3 trips to corner of block   3 trips around the block  C. Go to local [employer] and use the track.    Walk for distance ____ around track  Or time ____ minutes  Please only do the exercises that your therapist has initialed and dated

## 2017-08-14 NOTE — Therapy (Signed)
[facility] [address] [place], [place], [address] Phone: [phone]   Fax:  [phone]  Speech Language Pathology Treatment  Patient Details  Name: [name] [name] MRN: [id number] Date of Birth: June 26, 1946 Referring Provider: Jamse Arn, MD   Encounter Date: 08/14/2017  End of Session - 08/14/17 1252    Visit Number  6    Number of Visits  17    Date for SLP Re-Evaluation  09/22/17    SLP Start Time  0934    SLP Stop Time   1062    SLP Time Calculation (min)  41 min       Past Medical History:  Diagnosis Date  . DIABETES MELLITUS, TYPE II 01/02/2007  . HYPERLIPIDEMIA 06/18/2007  . HYPERTENSION 01/02/2007  . RENAL INSUFFICIENCY 02/05/2008    Past Surgical History:  Procedure Laterality Date  . LOOP RECORDER INSERTION N/A 07/11/2017   Procedure: LOOP RECORDER INSERTION;  Surgeon: Thompson Grayer, MD;  Location: [facility];  Service: Cardiovascular;  Laterality: N/A;  . SHOULDER SURGERY     left  . TEE WITHOUT CARDIOVERSION N/A 07/11/2017   Procedure: TRANSESOPHAGEAL ECHOCARDIOGRAM (TEE);  Surgeon: Dorothy Spark, MD;  [facility] ENDOSCOPY;  Service: Cardiovascular;  Laterality: N/A;  . TRANSURETHRAL RESECTION OF PROSTATE     history of retention/hematuria     There were no vitals filed for this visit.  Subjective Assessment - 08/14/17 0933    Subjective  Informed pt/wife to bring homework back to next scheduled SLP, not SLP who provided it.    Patient is accompained by:  Family member wife    Currently in Pain?  No/denies            ADULT SLP TREATMENT - 08/14/17 0940      General Information   Behavior/Cognition  Alert;Cooperative;Pleasant mood      Treatment Provided   Treatment provided  Cognitive-Linquistic      Cognitive-Linquistic Treatment   Treatment focused on  Cognition    Skilled Treatment  Wife reports pt taking own meds or will question wife if it is time, at  appropriate time, if pills are not laid out for him. SLP suggested wife and pt work out which steps would be easiest for pt to begin down the path of independence in medication management. Pt told SLP correct day and date, however told SLP pt had last been here " a couple weeks ago." SLP educated pt/wife re; Social research officer, government wiht sections for calendar (if desired), each therapy, to-do list, journal (and how to structure a journal). Pt wrote down atteniton/processing tips from last session with SLP today as SLP wrote them on white board. Pt with difficulty problem solving re: important information to put on notes (did not put date of session).       Assessment / Recommendations / Plan   Plan  Continue with current plan of care      Progression Toward Goals   Progression toward goals  Progressing toward goals       SLP Education - 08/14/17 1251    Education provided  Yes    Education Details  planner/memory notebook and sections in that book, journal to track daily events, suggtstions how to do graduated med administration    Person(s) Educated  Patient;Spouse    Methods  Explanation;Verbal cues    Comprehension  Verbalized understanding;Verbal cues required       SLP Short Term Goals - 08/14/17 1254  SLP SHORT TERM GOAL #1   Title  pt will demonstrate word finding compensations in 7 minutes simple conversation with rare min A for their use    Time  1    Period  Weeks    Status  On-going      SLP SHORT TERM GOAL #2   Title  pt will undergo cognitvie linguistic testing and goals to be added PRN    Status  Achieved      SLP SHORT TERM GOAL #3   Title  pt will achieve 85% success with simple word finding tasks over 3 sessions    Time  1    Period  Weeks    Status  On-going      SLP SHORT TERM GOAL #4   Title  pt will demo understanding of mod complex directions 85% success over three sessions    Time  1    Period  Weeks    Status  On-going      SLP SHORT TERM GOAL #5    Title  pt will indicate 1 cognitive deficit    Time  1    Period  Weeks    Status  Revised      SLP SHORT TERM GOAL #6   Title  pt will demonstrate appropriate selective attention in 5 minutes of therapy tasks    Time  1    Period  Weeks    Status  Revised      SLP SHORT TERM GOAL #7   Title  pt will initiate use of memory enhancing system    Time  1    Period  Weeks    Status  Revised       SLP Long Term Goals - 08/14/17 1255      SLP LONG TERM GOAL #1   Title  pt will functionally use compensatory strategies for anomia in 8 minutes simple to mod complex conversation    Time  5    Period  Weeks    Status  Revised      SLP LONG TERM GOAL #2   Title  pt will complete mod complex naming tasks with 90% success with modified independence (use of compensations)    Time  5    Period  Weeks    Status  On-going      SLP LONG TERM GOAL #3   Title  pt will demo functional comprehension in 10 minutes simple to mod complex convresation over three sessions    Time  5    Period  Weeks    Status  Revised      SLP LONG TERM GOAL #4   Title  pt will demo selective attention x3 in 10 minutes of therapy tasks in a min noisy environment     Time  5    Period  Weeks    Status  Revised      SLP LONG TERM GOAL #5   Title  pt will exhibit functional emergent awareness in min-mod complex cognitive linguistic therapy tasks    Time  5    Period  Weeks    Status  Revised      SLP LONG TERM GOAL #7   Title  pt will reportedly use memory enhancement system appropriately, with cues, in or between 5 therapy sessions    Time  5    Period  Weeks    Status  Revised       Plan - 08/14/17 1253  Clinical Impression Statement  Pt presents today with cont'd deficits in attention/attention to detail, memory, and slow processing. See skilled intervention for details. Pt would benefit from skilled ST to address expressive language and cognitive linguistic deficits.    Speech Therapy Frequency   2x / week    Treatment/Interventions  Language facilitation;Environmental controls;Cueing hierarchy;SLP instruction and feedback;Compensatory strategies;Patient/family education;Multimodal communcation approach;Internal/external aids;Cognitive reorganization;Functional tasks    Potential to Achieve Goals  Good    Potential Considerations  Severity of impairments    Consulted and Agree with Plan of Care  Patient;Family member/caregiver    Family Member Consulted  wife       Patient will benefit from skilled therapeutic intervention in order to improve the following deficits and impairments:   Cognitive communication deficit  Aphasia    Problem List Patient Active Problem List   Diagnosis Date Noted  . Hypoglycemia   . Uncontrolled hypertension   . Hypertensive urgency   . Hypertensive crisis   . Acute blood loss anemia   . Hypokalemia   . Acute ischemic left MCA stroke ([place]) 07/12/2017  . Diabetes mellitus type 2 in obese ([place])   . Stage 3 chronic kidney disease ([place])   . Benign essential HTN   . Left middle cerebral artery stroke ([place]) 07/09/2017  . Acute embolic stroke ([place])   . Aphasia 07/08/2017  . Gout 07/21/2011  . Prostatitis, chronic 07/14/2010  . RENAL INSUFFICIENCY 02/05/2008  . Hyperlipidemia 06/18/2007  . DM type 2 (diabetes mellitus, type 2) ([place]) 01/02/2007  . Hypertensive kidney disease 01/02/2007    [facility] ,[place], [place]  08/14/2017, 12:58 PM  [facility] [address] [place], [place], [address] Phone: [phone]   Fax:  [phone]   Name: ALEXX MCBURNEY MRN: [id number] Date of Birth: 12/14/1945

## 2017-08-16 LAB — CBC AND DIFFERENTIAL
HCT: 31 — AB (ref 41–53)
Hemoglobin: 10.5 — AB (ref 13.5–17.5)
Neutrophils Absolute: 4
Platelets: 347 (ref 150–399)
WBC: 6.5

## 2017-08-16 LAB — BASIC METABOLIC PANEL
BUN: 46 — AB (ref 4–21)
Creatinine: 2.7 — AB (ref 0.6–1.3)
GLUCOSE: 162
POTASSIUM: 4.8 (ref 3.4–5.3)
Sodium: 135 — AB (ref 137–147)

## 2017-08-17 ENCOUNTER — Encounter: Payer: Self-pay | Admitting: Occupational Therapy

## 2017-08-17 ENCOUNTER — Ambulatory Visit: Payer: Medicare Other | Admitting: Speech Pathology

## 2017-08-17 ENCOUNTER — Ambulatory Visit: Payer: Medicare Other

## 2017-08-17 ENCOUNTER — Telehealth: Payer: Self-pay | Admitting: Adult Health

## 2017-08-17 ENCOUNTER — Ambulatory Visit: Payer: Medicare Other | Admitting: Occupational Therapy

## 2017-08-17 DIAGNOSIS — R41840 Attention and concentration deficit: Secondary | ICD-10-CM | POA: Diagnosis not present

## 2017-08-17 DIAGNOSIS — R41844 Frontal lobe and executive function deficit: Secondary | ICD-10-CM

## 2017-08-17 DIAGNOSIS — R414 Neurologic neglect syndrome: Secondary | ICD-10-CM

## 2017-08-17 DIAGNOSIS — R41841 Cognitive communication deficit: Secondary | ICD-10-CM

## 2017-08-17 DIAGNOSIS — I69318 Other symptoms and signs involving cognitive functions following cerebral infarction: Secondary | ICD-10-CM

## 2017-08-17 DIAGNOSIS — R41842 Visuospatial deficit: Secondary | ICD-10-CM

## 2017-08-17 NOTE — Therapy (Signed)
[facility] [address] [place], [place], [address] Phone: [phone]   Fax:  [phone]  Speech Language Pathology Treatment  Patient Details  Name: [name] [name] MRN: [id number] Date of Birth: [birth year] Referring Provider: Jamse Arn, MD   Encounter Date: 08/17/2017  End of Session - 08/17/17 1608    Visit Number  7    Number of Visits  17    Date for SLP Re-Evaluation  09/22/17    SLP Start Time  0803    SLP Stop Time   0844    SLP Time Calculation (min)  41 min    Activity Tolerance  Patient tolerated treatment well       Past Medical History:  Diagnosis Date  . DIABETES MELLITUS, TYPE II 01/02/2007  . HYPERLIPIDEMIA 06/18/2007  . HYPERTENSION 01/02/2007  . RENAL INSUFFICIENCY 02/05/2008    Past Surgical History:  Procedure Laterality Date  . LOOP RECORDER INSERTION N/A 07/11/2017   Procedure: LOOP RECORDER INSERTION;  Surgeon: Thompson Grayer, MD;  Location: [facility];  Service: Cardiovascular;  Laterality: N/A;  . SHOULDER SURGERY     left  . TEE WITHOUT CARDIOVERSION N/A 07/11/2017   Procedure: TRANSESOPHAGEAL ECHOCARDIOGRAM (TEE);  Surgeon: Dorothy Spark, MD;  Location: [facility] ENDOSCOPY;  Service: Cardiovascular;  Laterality: N/A;  . TRANSURETHRAL RESECTION OF PROSTATE     history of retention/hematuria     There were no vitals filed for this visit.  Subjective Assessment - 08/17/17 0804    Subjective  "He said to have something written down for... ways to make sure your things will be secure"    Patient is accompained by:  Family member    Currently in Pain?  No/denies            ADULT SLP TREATMENT - 08/17/17 0804      General Information   Behavior/Cognition  Alert;Cooperative      Treatment Provided   Treatment provided  Cognitive-Linquistic      Pain Assessment   Pain Assessment  No/denies pain      Cognitive-Linquistic Treatment   Treatment focused on   Cognition    Skilled Treatment  Pt told SLP one cognitive deficit, telling SLP he has "trouble focusing." Mod question cues required for pt to recall previous therapist's recommendations and reasoning. Pt's wife states she feels a Social worker would be "more frustrating than comforting." She states pt successful in following routines at home. He is using a calendar which is kept in one location in the home which she reports he is referencing if he needs information. SLP educated re: pairing new tasks/responsibilities with an existing successful routine to habitualize. In simple wordfinding tasks, pt averaged 85% accuracy; mod A question cues required for remaining 15 % of items. SLP facilitated 7 minutes simple conversation re: sports during which pt demo'd intermittent hesitations but conversation WFL with additional time and rare min A x1 for description.       Assessment / Recommendations / Plan   Plan  Continue with current plan of care      Progression Toward Goals   Progression toward goals  Progressing toward goals       SLP Education - 08/17/17 1608    Education provided  Yes    Education Details  Utilize existing routines to build new habits    Person(s) Educated  Patient;Spouse    Methods  Explanation    Comprehension  Verbalized understanding  SLP Short Term Goals - 08/17/17 1559      SLP SHORT TERM GOAL #1   Title  pt will demonstrate word finding compensations in 7 minutes simple conversation with rare min A for their use    Time  1    Period  Weeks    Status  Achieved      SLP SHORT TERM GOAL #2   Title  pt will undergo cognitvie linguistic testing and goals to be added PRN    Status  Achieved      SLP SHORT TERM GOAL #3   Title  pt will achieve 85% success with simple word finding tasks over 3 sessions    Baseline  08/17/17    Time  1    Period  Weeks    Status  Partially Met      SLP SHORT TERM GOAL #4   Title  pt will demo understanding of mod complex  directions 85% success over three sessions    Time  1    Period  Weeks    Status  Not Met      SLP SHORT TERM GOAL #5   Title  pt will indicate 1 cognitive deficit    Time  1    Period  Weeks    Status  Achieved      SLP SHORT TERM GOAL #6   Title  pt will demonstrate appropriate selective attention in 5 minutes of therapy tasks    Time  1    Period  Weeks    Status  Not Met      SLP SHORT TERM GOAL #7   Title  pt will initiate use of memory enhancing system    Time  1    Period  Weeks    Status  Achieved       SLP Long Term Goals - 08/17/17 1605      SLP LONG TERM GOAL #1   Title  pt will functionally use compensatory strategies for anomia in 8 minutes simple to mod complex conversation    Time  5    Period  Weeks    Status  On-going      SLP LONG TERM GOAL #2   Title  pt will complete mod complex naming tasks with 90% success with modified independence (use of compensations)    Time  5    Period  Weeks    Status  On-going      SLP LONG TERM GOAL #3   Title  pt will demo functional comprehension in 10 minutes simple to mod complex convresation over three sessions    Time  5    Period  Weeks    Status  On-going      SLP LONG TERM GOAL #4   Title  pt will demo selective attention x3 in 10 minutes of therapy tasks in a min noisy environment     Time  5    Period  Weeks    Status  On-going      SLP LONG TERM GOAL #5   Title  pt will exhibit functional emergent awareness in min-mod complex cognitive linguistic therapy tasks    Time  5    Period  Weeks    Status  On-going      SLP LONG TERM GOAL #7   Title  pt will reportedly use memory enhancement system appropriately, with cues, in or between 5 therapy sessions    Time  5  Period  Weeks    Status  On-going       Plan - 08/17/17 1609    Clinical Impression Statement  Pt presents today with cont'd deficits in attention/attention to detail, memory, and slow processing. See skilled intervention for  details. Pt would benefit from skilled ST to address expressive language and cognitive linguistic deficits.    Speech Therapy Frequency  2x / week    Treatment/Interventions  Language facilitation;Environmental controls;Cueing hierarchy;SLP instruction and feedback;Compensatory strategies;Patient/family education;Multimodal communcation approach;Internal/external aids;Cognitive reorganization;Functional tasks    Potential to Achieve Goals  Good    Potential Considerations  Severity of impairments    Consulted and Agree with Plan of Care  Patient;Family member/caregiver    Family Member Consulted  wife       Patient will benefit from skilled therapeutic intervention in order to improve the following deficits and impairments:   Cognitive communication deficit    Problem List Patient Active Problem List   Diagnosis Date Noted  . Hypoglycemia   . Uncontrolled hypertension   . Hypertensive urgency   . Hypertensive crisis   . Acute blood loss anemia   . Hypokalemia   . Acute ischemic left MCA stroke ([place]) 07/12/2017  . Diabetes mellitus type 2 in obese ([place])   . Stage 3 chronic kidney disease ([place])   . Benign essential HTN   . Left middle cerebral artery stroke ([place]) 07/09/2017  . Acute embolic stroke ([place])   . Aphasia 07/08/2017  . Gout 07/21/2011  . Prostatitis, chronic 07/14/2010  . RENAL INSUFFICIENCY 02/05/2008  . Hyperlipidemia 06/18/2007  . DM type 2 (diabetes mellitus, type 2) ([place]) 01/02/2007  . Hypertensive kidney disease 01/02/2007   Dene[facility] Lever, [place], [place] 08/17/2017, 4:10 PM  [facility] [address] [place], [place], [address] Phone: [phone]   Fax:  [phone]   Name: [name] [name] MRN: [id number] Date of Birth: February 27, 1946

## 2017-08-17 NOTE — Telephone Encounter (Signed)
Copied from [place] [phone]. Topic: Quick Communication - Rx Refill/Question >> Aug 17, 2017 10:45 AM Scherrie Gerlach wrote: Medication:  nateglinide (STARLIX) 60 MG tablet Wife called to ask if pt should continue this med.  Pt was prescribed 30 days while in the hospital, and was not sure if he should continue.  Pt got out of hospital 07/16/17  Wife states his blood sugars have been low. Pt ran out of this med 2 days ago and his sugar was 89 this am. Pt saw nephrologist at [facility] this am who prescribed pt a 30 day and advised to call pcp if needs to continue. Pt will wait before starting and speak with [facility]. Pt does not have another appt until 09/29/17 for cpe.

## 2017-08-17 NOTE — Therapy (Signed)
[facility] [address] [place], [place], [address] Phone: [phone]   Fax:  [phone]  Occupational Therapy Treatment  Patient Details  Name: [name] [name] MRN: [id number] Date of Birth: 1946/06/05 Referring Provider: Reesa Chew, PA-C (f/u with Dr. Posey Pronto)   Encounter Date: 08/17/2017  OT End of Session - 08/17/17 0857    Visit Number  8    Number of Visits  17    Date for OT Re-Evaluation  09/15/17    Authorization Type  [employer] Medicare    Authorization Time Period  cert. date 07/17/17-09/15/17    OT Start Time  0852    OT Stop Time  0932    OT Time Calculation (min)  40 min    Activity Tolerance  Patient tolerated treatment well    Behavior During Therapy  [facility] for tasks assessed/performed       Past Medical History:  Diagnosis Date  . DIABETES MELLITUS, TYPE II 01/02/2007  . HYPERLIPIDEMIA 06/18/2007  . HYPERTENSION 01/02/2007  . RENAL INSUFFICIENCY 02/05/2008    Past Surgical History:  Procedure Laterality Date  . LOOP RECORDER INSERTION N/A 07/11/2017   Procedure: LOOP RECORDER INSERTION;  Surgeon: Thompson Grayer, MD;  Location: [facility];  Service: Cardiovascular;  Laterality: N/A;  . SHOULDER SURGERY     left  . TEE WITHOUT CARDIOVERSION N/A 07/11/2017   Procedure: TRANSESOPHAGEAL ECHOCARDIOGRAM (TEE);  Surgeon: Dorothy Spark, MD;  [facility]: [facility] ENDOSCOPY;  Service: Cardiovascular;  Laterality: N/A;  . TRANSURETHRAL RESECTION OF PROSTATE     history of retention/hematuria     There were no vitals filed for this visit.  Subjective Assessment - 08/17/17 0856    Subjective   wife reports that pt is doing more chores around the house.  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    Pertinent History  L middle cerebral artery stroke; DM, hyperlipidemia, renal insufficiency, HTN, hx L  shoulder surgery, loop recorder    Patient Stated Goals  improve speech, improve R side attention (needed prompting)    Currently in Pain?  No/denies        Organization/Planning:  Organizing shopping list to make shopping easier (simple-mod complex organization) with min-mod cueing for omission, and for help with defining/using appropriate categories.  Then, adding tasks/appointments on weekly calendar with min-mod cueing for errors, omission (decr attention to detail), and incr detail.  Pt/wife report that pt is participating in scheduling at home, but wife primarily performed previously as well.  Copying small peg design with R hand.  Pt needed min cueing to use R dominant hand only (was initially using L hand to assist with in-hand manipulation) and copied with min v.c. For accuracy.                       OT Education - 08/17/17 0903    Education Details  Recommended pt organize shopping list at home to make shopping easier    Person(s) Educated  Patient;Spouse    Methods  Explanation    Comprehension  Verbalized understanding       OT Short Term Goals - 08/17/17 0858      OT SHORT TERM GOAL #1   Title  Pt will be independent with HEP for cognitive/visual deficits.--check STGs 08/18/17    Time  4    Period  Weeks  Status  Achieved      OT SHORT TERM GOAL #2   Title  Pt will perform complex tabletop visual scanning with at least 95% accuracy.    Time  4    Period  Weeks    Status  On-going 08/17/17:  not consistent      OT SHORT TERM GOAL #3   Title  Pt will attend to functional task for at least 48mn in busy environment.    Time  4    Period  Weeks    Status  Achieved      OT SHORT TERM GOAL #4   Title  Pt will perform mod complex organization tasks with at least 75% accuracy.    Time  4    Period  Weeks    Status  On-going 08/17/17:  met with simple organization tasks at approx this level, but not mod complex        OT Long Term Goals -  07/17/17 1523      OT LONG TERM GOAL #1   Title  Pt will perform mod complex environmental scanning with at least 90% accuracy.    Time  8    Period  Weeks    Status  New      OT LONG TERM GOAL #2   Title  Pt will perform mod complex organization tasks with at least 90% accuracy.    Time  8    Period  Weeks    Status  New      OT LONG TERM GOAL #3   Title  Pt will divide attention between cognitive and physical task with at least 85% accuracy for improved safety with community activities/in prep for driving.    Time  8    Period  Weeks    Status  New            Plan - 08/17/17 0857    Clinical Impression Statement  Pt continues to demo difficulty with organization and following written directions accurately for task.  Pt also noted to use LUE to assist with in-hand manipulation vs. dominant R hand.    Rehab Potential  Good    OT Frequency  2x / week    OT Duration  8 weeks    OT Treatment/Interventions  Self-care/ADL training;Moist Heat;DME and/or AE instruction;Therapeutic activities;Cognitive remediation/compensation;Therapeutic exercise;Neuromuscular education;Cryotherapy;Functional Mobility Training;Passive range of motion;Visual/perceptual remediation/compensation;Patient/family education;Energy conservation;Manual Therapy    Plan  continue to address visual scanning and cognition in a functional context; ?environmental scanning with organization    Recommended Other Services  schedule for PT eval; current with ST    Consulted and Agree with Plan of Care  Patient;Family member/caregiver       Patient will benefit from skilled therapeutic intervention in order to improve the following deficits and impairments:  Decreased cognition, Impaired vision/preception, Decreased coordination, Impaired perceived functional ability, Decreased safety awareness, Decreased knowledge of precautions  Visit Diagnosis: Attention and concentration deficit  Frontal lobe and executive  function deficit  Visuospatial deficit  Neurologic neglect syndrome  Other symptoms and signs involving cognitive functions following cerebral infarction    Problem List Patient Active Problem List   Diagnosis Date Noted  . Hypoglycemia   . Uncontrolled hypertension   . Hypertensive urgency   . Hypertensive crisis   . Acute blood loss anemia   . Hypokalemia   . Acute ischemic left MCA stroke ([place] 07/12/2017  . Diabetes mellitus type 2 in obese ([place]   . Stage  3 chronic kidney disease ([place])   . Benign essential HTN   . Left middle cerebral artery stroke ([place]) 07/09/2017  . Acute embolic stroke ([place])   . Aphasia 07/08/2017  . Gout 07/21/2011  . Prostatitis, chronic 07/14/2010  . RENAL INSUFFICIENCY 02/05/2008  . Hyperlipidemia 06/18/2007  . DM type 2 (diabetes mellitus, type 2) ([place]) 01/02/2007  . Hypertensive kidney disease 01/02/2007    [facility] 08/17/2017, 10:48 AM  [facility] [address] [place], [place], [address] Phone: [phone]   Fax:  [phone]  Name: [name] [name] MRN: [id number] Date of Birth: 06-Sep-1945   Vianne Bulls, OTR/L [facility] [address] [place], [place]  [address] [phone] phone [phone] 08/17/17 10:48 AM

## 2017-08-18 NOTE — Telephone Encounter (Signed)
Have him stop Starlix and let us know what his blood sugars are over the weekend

## 2017-08-18 NOTE — Telephone Encounter (Signed)
Spoke to [name] [name] and she reports low glucose readings in the mornings.  Levels in the evening are 100-119.  08/18/17 - 85 this morning   08/17/17 - 83 yesterday morning

## 2017-08-18 NOTE — Telephone Encounter (Signed)
If he is not having any hypoglycemic events then I would like him to continue this medication until I see him again for repeat blood work in march

## 2017-08-18 NOTE — Telephone Encounter (Signed)
Spoke to [name] and informed her to stop Starlix.  Keep a record of bg readings and call back on Tuesday with readings.

## 2017-08-22 ENCOUNTER — Telehealth: Payer: Self-pay | Admitting: Family Medicine

## 2017-08-22 NOTE — Telephone Encounter (Signed)
Copied from [place] [phone]. Topic: Quick Communication - Rx Refill/Question >> Aug 17, 2017 10:45 AM Scherrie Gerlach wrote: Medication:  nateglinide (STARLIX) 60 MG tablet Wife called to ask if pt should continue this med.  Pt was prescribed 30 days while in the hospital, and was not sure if he should continue.  Pt got out of hospital 07/16/17  Wife states his blood sugars have been low. Pt ran out of this med 2 days ago and his sugar was 89 this am. Pt saw nephrologist at [facility] this am who prescribed pt a 30 day and advised to call pcp if needs to continue. Pt does not have another appt until 09/29/17 for cpe. >> Aug 22, 2017 10:58 AM Percell Belt A wrote: Sugar Readings   2/15 Morning-85 Evening -111  2/16 Morning- 101 evening 106  2/17 Morning-96 Evening-134  2/18 Morning-96 Evening -126  2/19 Morning -111  Per wife pt BP has been high the last couple of nights as well.  Pt had a med change on the 2/13 Last 2 days he has been dragging the right leg.  Best number [phone]

## 2017-08-23 ENCOUNTER — Encounter: Payer: Self-pay | Admitting: Family Medicine

## 2017-08-23 NOTE — Telephone Encounter (Signed)
Spoke to [name].  Pt now scheduled for 08/24/17 @ 3:30 to see [facility].  Notified to continue Starlix once daily in the afternoon.  Currently has a 30 day supply.  Do not need to send in a prescription.

## 2017-08-23 NOTE — Telephone Encounter (Signed)
Blood sugars are a little high in the evening it appears, have him take starlix in the afternoon.   I would like to see him for the foot drop and hypertension

## 2017-08-24 ENCOUNTER — Ambulatory Visit: Payer: Medicare Other | Admitting: Adult Health

## 2017-08-24 ENCOUNTER — Encounter: Payer: Self-pay | Admitting: Adult Health

## 2017-08-24 VITALS — BP 162/90 | Temp 99.0°F | Wt 226.0 lb

## 2017-08-24 DIAGNOSIS — I10 Essential (primary) hypertension: Secondary | ICD-10-CM | POA: Diagnosis not present

## 2017-08-24 DIAGNOSIS — E119 Type 2 diabetes mellitus without complications: Secondary | ICD-10-CM | POA: Diagnosis not present

## 2017-08-24 MED ORDER — GLUCOSE BLOOD VI STRP: 1.0000 | ORAL_STRIP | Freq: Every day | 11 refills | Status: DC

## 2017-08-24 NOTE — Progress Notes (Signed)
Subjective:    Patient ID: [name] [name], male    DOB: 07-Feb-1946, 72 y.o.   MRN: [id number]  HPI 72 year old male who  has a past medical history of DIABETES MELLITUS, TYPE II (01/02/2007), HYPERLIPIDEMIA (06/18/2007), HYPERTENSION (01/02/2007), and RENAL INSUFFICIENCY (02/05/2008).  He presents to the office today for follow up regarding hypertension.  His wife is present who helps provide history today.  She reports that her hydralazine was changed on 08/16/2017 by nephrology.  Previously he was taking hydralazine 3 times daily nephrology changed to twice daily.  Since the change [facility] blood pressure readings in the evening have been elevated, per their log he has many nighttime blood pressure readings in the 140s-160s range.  A.m. blood pressure readings are within normal limits.  I had recently changed Starlix from 3 times daily to daily due to hypoglycemia.  Since changing to daily his blood errors have stayed within normal limits.  Advised his wife to continue to monitor at home and report back to me consistent elevation in blood sugar readings   Review of Systems  See HPI   Past Medical History:  Diagnosis Date  . DIABETES MELLITUS, TYPE II 01/02/2007  . HYPERLIPIDEMIA 06/18/2007  . HYPERTENSION 01/02/2007  . RENAL INSUFFICIENCY 02/05/2008    Social History   Socioeconomic History  . Marital status: Married    Spouse name: Not on file  . Number of children: Not on file  . Years of education: Not on file  . Highest education level: Not on file  Social Needs  . Financial resource strain: Not on file  . Food insecurity - worry: Not on file  . Food insecurity - inability: Not on file  . Transportation needs - medical: Not on file  . Transportation needs - non-medical: Not on file  Occupational History  . Not on file  Tobacco Use  . Smoking status: Never Smoker  . Smokeless tobacco: Never Used  Substance and Sexual Activity  . Alcohol use: No  . Drug use: No  . Sexual  activity: Not on file  Other Topics Concern  . 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 Likes to go to football games.     Past Surgical History:  Procedure Laterality Date  . LOOP RECORDER INSERTION N/A 07/11/2017   Procedure: LOOP RECORDER INSERTION;  Surgeon: Thompson Grayer, MD;  Location: [facility];  Service: Cardiovascular;  Laterality: N/A;  . SHOULDER SURGERY     left  . TEE WITHOUT CARDIOVERSION N/A 07/11/2017   Procedure: TRANSESOPHAGEAL ECHOCARDIOGRAM (TEE);  Surgeon: Dorothy Spark, MD;  Location: [facility] ENDOSCOPY;  Service: Cardiovascular;  Laterality: N/A;  . TRANSURETHRAL RESECTION OF PROSTATE     history of retention/hematuria     Family History  Problem Relation Age of Onset  . Hypertension Mother   . Diabetes Mother   . Stroke Father   . Stroke Maternal Grandmother     Allergies  Allergen Reactions  . Bactrim [Sulfamethoxazole-Trimethoprim] Hives, Itching and Swelling  . Penicillins     Has patient had a PCN reaction causing immediate rash, facial/tongue/throat swelling, SOB or lightheadedness with hypotension: Unk Has patient had a PCN reaction causing severe rash involving mucus membranes or skin necrosis: Unk Has patient had a PCN reaction  that required hospitalization: Unk Has patient had a PCN reaction occurring within the last 10 years: No If all of the above answers are "NO", then may proceed with Cephalosporin use.  . Statins     myalgia  . Sulfa Drugs Cross Reactors Hives, Itching and Swelling    Current Outpatient Medications on File Prior to Visit  Medication Sig Dispense Refill  . aspirin 325 MG EC tablet Take 1 tablet (325 mg total) by mouth daily. 30 tablet 0  . benazepril (LOTENSIN) 40 MG tablet Take 1 tablet (40 mg total) by mouth 2 (two) times daily. 60 tablet 0  . calcitRIOL  (ROCALTROL) 0.25 MCG capsule Take 1 capsule by mouth 3 (three) times daily.     Marland Kitchen ezetimibe (ZETIA) 10 MG tablet Take 1 tablet (10 mg total) by mouth daily. 30 tablet 1  . hydrALAZINE (APRESOLINE) 50 MG tablet Take 1 tablet (50 mg total) by mouth 3 (three) times daily. (Patient taking differently: Take 50 mg by mouth 2 (two) times daily. ) 90 tablet 0  . Insulin Glargine (LANTUS SOLOSTAR) 100 UNIT/ML Solostar Pen Inject 18 Units into the skin daily at 10 pm. INJECT 20 UNITS SUBCUTANEOUSLY ONCE DAILY (Patient taking differently: Inject 18 Units into the skin daily at 10 pm. ) 2 pen 6  . labetalol (NORMODYNE) 200 MG tablet Take 0.5 tablets (100 mg total) by mouth 2 (two) times daily. 120 tablet 0  . nateglinide (STARLIX) 60 MG tablet Take 1 tablet (60 mg total) by mouth 3 (three) times daily with meals. (Patient taking differently: Take 60 mg by mouth every evening. ) 90 tablet 0   No current facility-administered medications on file prior to visit.     BP (!) 162/90 (BP Location: Left Arm)   Temp 99 F (37.2 C) (Oral)   Wt 226 lb (102.5 kg)   BMI 28.25 kg/m     Past Medical History:  Diagnosis Date  . DIABETES MELLITUS, TYPE II 01/02/2007  . HYPERLIPIDEMIA 06/18/2007  . HYPERTENSION 01/02/2007  . RENAL INSUFFICIENCY 02/05/2008    Social History   Socioeconomic History  . Marital status: Married    Spouse name: Not on file  . Number of children: Not on file  . Years of education: Not on file  . Highest education level: Not on file  Social Needs  . Financial resource strain: Not on file  . Food insecurity - worry: Not on file  . Food insecurity - inability: Not on file  . Transportation needs - medical: Not on file  . Transportation needs - non-medical: Not on file  Occupational History  . Not on file  Tobacco Use  . Smoking status: Never Smoker  . Smokeless tobacco: Never Used  Substance and Sexual Activity  . Alcohol use: No  . Drug use: No  . Sexual activity: Not on file    Other Topics Concern  . 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 Likes to go to football games.     Past Surgical History:  Procedure Laterality Date  . LOOP RECORDER INSERTION N/A 07/11/2017   Procedure: LOOP RECORDER INSERTION;  Surgeon: Thompson Grayer, MD;  Location: [facility];  Service: Cardiovascular;  Laterality: N/A;  . SHOULDER SURGERY     left  .  TEE WITHOUT CARDIOVERSION N/A 07/11/2017   Procedure: TRANSESOPHAGEAL ECHOCARDIOGRAM (TEE);  Surgeon: Dorothy Spark, MD;  Location: [facility] ENDOSCOPY;  Service: Cardiovascular;  Laterality: N/A;  . TRANSURETHRAL RESECTION OF PROSTATE     history of retention/hematuria     Family History  Problem Relation Age of Onset  . Hypertension Mother   . Diabetes Mother   . Stroke Father   . Stroke Maternal Grandmother     Allergies  Allergen Reactions  . Bactrim [Sulfamethoxazole-Trimethoprim] Hives, Itching and Swelling  . Penicillins     Has patient had a PCN reaction causing immediate rash, facial/tongue/throat swelling, SOB or lightheadedness with hypotension: Unk Has patient had a PCN reaction causing severe rash involving mucus membranes or skin necrosis: Unk Has patient had a PCN reaction that required hospitalization: Unk Has patient had a PCN reaction occurring within the last 10 years: No If all of the above answers are "NO", then may proceed with Cephalosporin use.  . Statins     myalgia  . Sulfa Drugs Cross Reactors Hives, Itching and Swelling    Current Outpatient Medications on File Prior to Visit  Medication Sig Dispense Refill  . aspirin 325 MG EC tablet Take 1 tablet (325 mg total) by mouth daily. 30 tablet 0  . benazepril (LOTENSIN) 40 MG tablet Take 1 tablet (40 mg total) by mouth 2 (two) times daily. 60 tablet 0  . calcitRIOL (ROCALTROL) 0.25 MCG capsule  Take 1 capsule by mouth 3 (three) times daily.     Marland Kitchen ezetimibe (ZETIA) 10 MG tablet Take 1 tablet (10 mg total) by mouth daily. 30 tablet 1  . glucose blood (ONETOUCH VERIO) test strip 1 each by Other route daily. Use as instructed 100 each 11  . hydrALAZINE (APRESOLINE) 50 MG tablet Take 1 tablet (50 mg total) by mouth 3 (three) times daily. (Patient taking differently: Take 50 mg by mouth 2 (two) times daily. ) 90 tablet 0  . Insulin Glargine (LANTUS SOLOSTAR) 100 UNIT/ML Solostar Pen Inject 18 Units into the skin daily at 10 pm. INJECT 20 UNITS SUBCUTANEOUSLY ONCE DAILY (Patient taking differently: Inject 18 Units into the skin daily at 10 pm. ) 2 pen 6  . labetalol (NORMODYNE) 200 MG tablet Take 0.5 tablets (100 mg total) by mouth 2 (two) times daily. 120 tablet 0  . nateglinide (STARLIX) 60 MG tablet Take 1 tablet (60 mg total) by mouth 3 (three) times daily with meals. (Patient taking differently: Take 60 mg by mouth every evening. ) 90 tablet 0   No current facility-administered medications on file prior to visit.     BP (!) 162/90 (BP Location: Left Arm)   Temp 99 F (37.2 C) (Oral)   Wt 226 lb (102.5 kg)   BMI 28.25 kg/m   BP Readings from Last 3 Encounters:  08/24/17 (!) 162/90  07/31/17 130/78  07/27/17 (!) 158/81       Objective:   Physical Exam  Constitutional: He is oriented to person, place, and time. He appears well-developed and well-nourished. No distress.  Cardiovascular: Normal rate, regular rhythm, normal heart sounds and intact distal pulses. Exam reveals no gallop and no friction rub.  No murmur heard. Pulmonary/Chest: Effort normal and breath sounds normal. No respiratory distress. He has no wheezes. He has no rales.  Neurological: He is alert and oriented to person, place, and time.  Skin: Skin is warm and dry. No rash noted. He is not diaphoretic. No erythema. No pallor.  Psychiatric: He has  a normal mood and affect. His behavior is normal. Judgment and  thought content normal.  Nursing note and vitals reviewed.     Assessment & Plan:  1. Uncontrolled hypertension -Advised to follow-up with nephrology since they are handling his blood pressure at this point in time.  He was controlled on hydralazine 3 times daily and he might have to go back to that dosing schedule.  2. DM type 2 (diabetes mellitus, type 2) ([place]) -Continue to  you to monitor blood sugars at home - glucose blood (ONETOUCH VERIO) test strip; 1 each by Other route daily. Use as instructed  Dispense: 100 each; Refill: [place], NP

## 2017-08-25 ENCOUNTER — Encounter: Payer: Self-pay | Admitting: Physical Therapy

## 2017-08-25 ENCOUNTER — Ambulatory Visit: Payer: Medicare Other

## 2017-08-25 ENCOUNTER — Ambulatory Visit: Payer: Medicare Other | Admitting: Physical Therapy

## 2017-08-25 DIAGNOSIS — R41841 Cognitive communication deficit: Secondary | ICD-10-CM

## 2017-08-25 DIAGNOSIS — R2681 Unsteadiness on feet: Secondary | ICD-10-CM

## 2017-08-25 DIAGNOSIS — R2689 Other abnormalities of gait and mobility: Secondary | ICD-10-CM

## 2017-08-25 DIAGNOSIS — R4701 Aphasia: Secondary | ICD-10-CM

## 2017-08-25 DIAGNOSIS — R41840 Attention and concentration deficit: Secondary | ICD-10-CM | POA: Diagnosis not present

## 2017-08-25 NOTE — Patient Instructions (Signed)
  Please complete the assigned speech therapy homework prior to your next session and return it to the speech therapist at your next visit.  

## 2017-08-25 NOTE — Patient Instructions (Addendum)
Perform these in the corner with chair in front for safety: Feet Together (Compliant Surface) Varied Arm Positions - Eyes Closed    Stand on compliant surface: _pillow/s_ with feet together and arms at sides. Close eyes and visualize upright position. Hold__30__ seconds. Repeat _3_ times per session. Do _1-2_ sessions per day.  Copyright  VHI. All rights reserved.      Single Leg (Compliant Surface) - Eyes Open    Stand on compliant surface: _pillow/s_ holding support. Lift right leg while maintaining balance over other leg. Progress to removing hands from support surface for longer periods of time. Hold__20__ seconds.  Repeat with other leg- standing on right with left foot lifted up. Hold for 20 seconds while progressing to removing hands from support surface.  Repeat _3_ times each leg per session. Do ___1-2_ sessions per day.  Copyright  VHI. All rights reserved.   Feet Heel-Toe "Tandem" (Compliant Surface) Varied Arm Positions - Eyes Closed    Stand on compliant surface: ___ with right foot directly in front of the other and arms out. Close eyes and visualize upright position. Hold____ seconds. Repeat ____ times per session. Do ____ sessions per day.  Copyright  VHI. All rights reserved.  Feet Heel-Toe "Tandem" (Compliant Surface) Varied Arm Positions - Eyes Open    With eyes open, standing on compliant surface: _pillow/s_, right foot directly in front of the other, look at a stationary object. Hold __20__ seconds.  Then with left foot directly in front of the left foot, hold for 20 seconds.   Hold for balance as needed, trying to remove hands from support surface as able.  Repeat _3_ times each foot forward per session. Do __1-2__ sessions per day.  Copyright  VHI. All rights reserved.

## 2017-08-25 NOTE — Therapy (Signed)
[facility] [address] [place], [place], [address] Phone: [phone]   Fax:  [phone]  Speech Language Pathology Treatment  Patient Details  Name: [name] [name] MRN: [id number] Date of Birth: 1945/12/23 Referring Provider: Jamse Arn, MD   Encounter Date: 08/25/2017  End of Session - 08/25/17 1501    Visit Number  8    Number of Visits  17    Date for SLP Re-Evaluation  09/22/17    SLP Start Time  53    SLP Stop Time   1400    SLP Time Calculation (min)  42 min    Activity Tolerance  Patient tolerated treatment well       Past Medical History:  Diagnosis Date  . DIABETES MELLITUS, TYPE II 01/02/2007  . HYPERLIPIDEMIA 06/18/2007  . HYPERTENSION 01/02/2007  . RENAL INSUFFICIENCY 02/05/2008    Past Surgical History:  Procedure Laterality Date  . LOOP RECORDER INSERTION N/A 07/11/2017   Procedure: LOOP RECORDER INSERTION;  Surgeon: Thompson Grayer, MD;  Location: [facility];  Service: Cardiovascular;  Laterality: N/A;  . SHOULDER SURGERY     left  . TEE WITHOUT CARDIOVERSION N/A 07/11/2017   Procedure: TRANSESOPHAGEAL ECHOCARDIOGRAM (TEE);  Surgeon: Dorothy Spark, MD;  Location: [facility] ENDOSCOPY;  Service: Cardiovascular;  Laterality: N/A;  . TRANSURETHRAL RESECTION OF PROSTATE     history of retention/hematuria     There were no vitals filed for this visit.  Subjective Assessment - 08/25/17 1322    Currently in Pain?  No/denies  (Pended)             ADULT SLP TREATMENT - 08/25/17 1349      General Information   Behavior/Cognition  Alert;Cooperative flat affect      Treatment Provided   Treatment provided  Cognitive-Linquistic      Cognitive-Linquistic Treatment   Treatment focused on  Aphasia    Skilled Treatment  SLP targeted pt's anomia today with divergent naming (mod complex). Pt with three responses prior to requiring mod-max A consistently. SLP then educated pt re:  compensations for anomia and had pt practice describing common objects with written cues re: some parameters to include: color, shape, function, composition, location. Pt used written cues consistently.       Assessment / Recommendations / Plan   Plan  Continue with current plan of care      Progression Toward Goals   Progression toward goals  Progressing toward goals       SLP Education - 08/25/17 1500    Education provided  Yes    Education Details  compensations for anomia, items to use for description    Person(s) Educated  Patient;Spouse    Methods  Explanation;Verbal cues;Other (comment) written cues   written cues   Comprehension  Verbalized understanding;Need further instruction;Returned demonstration       SLP Short Term Goals - 08/25/17 1503      SLP SHORT TERM GOAL #1   Title  pt will demonstrate word finding compensations in 7 minutes simple conversation with rare min A for their use    Status  Achieved      SLP SHORT TERM GOAL #2   Title  pt will undergo cognitvie linguistic testing and goals to be added PRN    Status  Achieved      SLP SHORT TERM GOAL #3   Title  pt will achieve 85% success with simple word finding tasks over 3 sessions  Baseline  08/17/17    Status  Partially Met      SLP SHORT TERM GOAL #4   Title  pt will demo understanding of mod complex directions 85% success over three sessions    Status  Not Met      SLP SHORT TERM GOAL #5   Title  pt will indicate 1 cognitive deficit    Status  Achieved      SLP SHORT TERM GOAL #6   Title  pt will demonstrate appropriate selective attention in 5 minutes of therapy tasks    Status  Not Met      SLP SHORT TERM GOAL #7   Title  pt will initiate use of memory enhancing system    Status  Achieved       SLP Long Term Goals - 08/25/17 1504      SLP LONG TERM GOAL #1   Title  pt will functionally use compensatory strategies for anomia in 8 minutes simple to mod complex conversation    Time  4     Period  Weeks    Status  On-going      SLP LONG TERM GOAL #2   Title  pt will complete mod complex naming tasks with 90% success with modified independence (use of compensations)    Time  4    Period  Weeks    Status  On-going      SLP LONG TERM GOAL #3   Title  pt will demo functional comprehension in 10 minutes simple to mod complex convresation over three sessions    Time  4    Period  Weeks    Status  On-going      SLP LONG TERM GOAL #4   Title  pt will demo selective attention x3 in 10 minutes of therapy tasks in a min noisy environment     Time  4    Period  Weeks    Status  On-going      SLP LONG TERM GOAL #5   Title  pt will exhibit functional emergent awareness in min-mod complex cognitive linguistic therapy tasks    Time  4    Period  Weeks    Status  On-going      SLP LONG TERM GOAL #7   Title  pt will reportedly use memory enhancement system appropriately, with cues, in or between 5 therapy sessions    Time  4    Period  Weeks    Status  On-going       Plan - 08/25/17 1502    Clinical Impression Statement  Pt presents today with anomia and pausing/hesitation during strucutred speech tasks and during simple conversation. See skilled intervention for details. Pt would benefit from skilled ST to address expressive language and cognitive linguistic deficits.    Speech Therapy Frequency  2x / week    Treatment/Interventions  Language facilitation;Environmental controls;Cueing hierarchy;SLP instruction and feedback;Compensatory strategies;Patient/family education;Multimodal communcation approach;Internal/external aids;Cognitive reorganization;Functional tasks    Potential to Achieve Goals  Good    Potential Considerations  Severity of impairments    Consulted and Agree with Plan of Care  Patient;Family member/caregiver    Family Member Consulted  wife       Patient will benefit from skilled therapeutic intervention in order to improve the following deficits and  impairments:   Cognitive communication deficit  Aphasia    Problem List Patient Active Problem List   Diagnosis Date Noted  . Hypoglycemia   .  Uncontrolled hypertension   . Hypertensive urgency   . Hypertensive crisis   . Acute blood loss anemia   . Hypokalemia   . Acute ischemic left MCA stroke ([place]) 07/12/2017  . Diabetes mellitus type 2 in obese ([place])   . Stage 3 chronic kidney disease ([place])   . Benign essential HTN   . Left middle cerebral artery stroke ([place]) 07/09/2017  . Acute embolic stroke ([place])   . Aphasia 07/08/2017  . Gout 07/21/2011  . Prostatitis, chronic 07/14/2010  . RENAL INSUFFICIENCY 02/05/2008  . Hyperlipidemia 06/18/2007  . DM type 2 (diabetes mellitus, type 2) ([place]) 01/02/2007  . Hypertensive kidney disease 01/02/2007    [facility] ,[place], [place]  08/25/2017, 3:04 PM  [facility] [address] [place], [place], [address] Phone: [phone]   Fax:  [phone]   Name: JAMAREA SELNER MRN: [id number] Date of Birth: January 12, 1946

## 2017-08-26 ENCOUNTER — Other Ambulatory Visit: Payer: Self-pay | Admitting: Adult Health

## 2017-08-26 DIAGNOSIS — I129 Hypertensive chronic kidney disease with stage 1 through stage 4 chronic kidney disease, or unspecified chronic kidney disease: Secondary | ICD-10-CM

## 2017-08-27 NOTE — Therapy (Signed)
[facility] [address] [place], [place], [address] Phone: [phone]   Fax:  [phone]  Physical Therapy Treatment  Patient Details  Name: [name] [name] MRN: [id number] Date of Birth: 03/13/1946 Referring Provider: Reesa Chew, PA-C (f/u with Dr. Posey Pronto)   Encounter Date: 08/25/2017     08/25/17 1458  PT Visits / Re-Eval  Visit Number 5  Number of Visits 6  Date for PT Re-Evaluation 09/02/17  Authorization  Authorization Type [employer] medicare $40 copay for PT and ST combined  PT Time Calculation  PT Start Time 6144  PT Stop Time 1528  PT Time Calculation (min) 41 min  PT - End of Session  Equipment Utilized During Treatment Gait belt  Activity Tolerance Patient tolerated treatment well  Behavior During Therapy [facility] for tasks assessed/performed (minimal verbal interaction due to speech deficits)    Past Medical History:  Diagnosis Date  . DIABETES MELLITUS, TYPE II 01/02/2007  . HYPERLIPIDEMIA 06/18/2007  . HYPERTENSION 01/02/2007  . RENAL INSUFFICIENCY 02/05/2008    Past Surgical History:  Procedure Laterality Date  . LOOP RECORDER INSERTION N/A 07/11/2017   Procedure: LOOP RECORDER INSERTION;  Surgeon: Thompson Grayer, MD;  Location: [facility];  Service: Cardiovascular;  Laterality: N/A;  . SHOULDER SURGERY     left  . TEE WITHOUT CARDIOVERSION N/A 07/11/2017   Procedure: TRANSESOPHAGEAL ECHOCARDIOGRAM (TEE);  Surgeon: Dorothy Spark, MD;  Location: [facility] ENDOSCOPY;  Service: Cardiovascular;  Laterality: N/A;  . TRANSURETHRAL RESECTION OF PROSTATE     history of retention/hematuria     There were no vitals filed for this visit.     08/25/17 1452  Symptoms/Limitations  Subjective No new complaints. No falls or pain to report. Did not bring in HEP to date.   Pertinent History diabetes, renal insufficiency, HTN, h/o L shoulder surgery   Patient Stated Goals Unsure- hard to vocalize due to speech  difficulties.   Pain Assessment  Currently in Pain? No/denies  Pain Score 0      08/25/17 1730  Self-Care  Self-Care Other Self-Care Comments  Other Self-Care Comments  verbally reviewed gym equipment safe for pt to use with pt /spouse. then had pt trial treadmill, elliptical, seated recumbent bike and leg press in our gym,  reviewed proper set up as well. pt/spouse report there will be trainers at the gym to assist them as well.               Issued the following to HEP today with cues on technique. Perform these in the corner with chair in front for safety: Feet Together (Compliant Surface) Varied Arm Positions - Eyes Closed    Stand on compliant surface: _pillow/s_ with feet together and arms at sides. Close eyes and visualize upright position. Hold__30__ seconds. Repeat _3_ times per session. Do _1-2_ sessions per day.  Copyright  VHI. All rights reserved.      Single Leg (Compliant Surface) - Eyes Open    Stand on compliant surface: _pillow/s_ holding support. Lift right leg while maintaining balance over other leg. Progress to removing hands from support surface for longer periods of time. Hold__20__ seconds.  Repeat with other leg- standing on right with left foot lifted up. Hold for 20 seconds while progressing to removing hands from support surface.  Repeat _3_ times each leg per session. Do ___1-2_ sessions per day.  Copyright  VHI. All rights reserved.   Feet Heel-Toe "Tandem" (Compliant Surface) Varied Arm Positions - Eyes  Closed    Stand on compliant surface: ___ with right foot directly in front of the other and arms out. Close eyes and visualize upright position. Hold____ seconds. Repeat ____ times per session. Do ____ sessions per day.  Copyright  VHI. All rights reserved.  Feet Heel-Toe "Tandem" (Compliant Surface) Varied Arm Positions - Eyes Open    With eyes open, standing on compliant surface: _pillow/s_, right foot directly in front of the  other, look at a stationary object. Hold __20__ seconds.  Then with left foot directly in front of the left foot, hold for 20 seconds.   Hold for balance as needed, trying to remove hands from support surface as able.  Repeat _3_ times each foot forward per session. Do __1-2__ sessions per day.  Copyright  VHI. All rights reserved.      08/25/17 1526  PT Education  Education provided Yes  Education Details advanced HEP for balance today; reviewed safe components of community fitness  Person(s) Educated Patient;Spouse  Methods Explanation;Demonstration;Verbal cues;Handout  Comprehension Verbalized understanding;Returned demonstration       PT Short Term Goals - 07/19/17 1044      PT SHORT TERM GOAL #1   Title  The patient will return demo HEP for high level balance and dynamic gait activities.    Time  4    Period  Weeks    Target Date  08/18/17      PT SHORT TERM GOAL #2   Title  The patient will improve FGA from 21/30 to > or equal to 24/30 to demonstrate improved dynamic gait abilities for community mobility.    Time  4    Period  Weeks    Target Date  08/18/17      PT SHORT TERM GOAL #3   Title  The patient will return to modified [facility] routine [facility] [employer] (initially recommending walking on indoor track) to include cardio, strengthening.    Time  4    Period  Weeks    Target Date  08/18/17      PT SHORT TERM GOAL #4   Title  --        PT Long Term Goals - 07/19/17 1046      PT LONG TERM GOAL #1   Title  The patient will be independent with post d/c wellness program.    Time  6    Period  Weeks    Target Date  09/02/17      PT LONG TERM GOAL #2   Title  The patient will improve FGA from 21/30 to > or equal to 26/30 to demonstrate improving dynamic mobility.    Time  6    Period  Weeks    Target Date  09/02/17      PT LONG TERM GOAL #3   Title  The patient will improve Berg from 49/56 to > or equal to 53/56 to demonstrate improved narrow base of support  balance/    Time  6    Period  Weeks    Target Date  09/02/17       08/18/17 1730  Plan  Clinical Impression Statement Today's skilled session focused on review of HEP with advancement today. Also addressed gym equipment safe for pt to use and to go low on weight with increased reps at this time. Deferred UE ex's to OT. Pt is progressing toward goals.   Pt will benefit from skilled therapeutic intervention in order to improve on the following deficits  Decreased activity tolerance;Decreased balance;Difficulty walking  Rehab Potential Good  PT Frequency 1x / week  PT Duration 6 weeks  PT Treatment/Interventions ADLs/Self Care Home Management;Balance training;Neuromuscular re-education;Patient/family education;Gait training;Functional mobility training;Therapeutic activities;Therapeutic exercise  PT Next Visit Plan plan for discharge at next visit, assess LTGs  Consulted and Agree with Plan of Care Patient;Family member/caregiver  Family Member Consulted spouse     Patient will benefit from skilled therapeutic intervention in order to improve the following deficits and impairments:  Decreased activity tolerance, Decreased balance, Difficulty walking  Visit Diagnosis: Unsteadiness on feet  Other abnormalities of gait and mobility     Problem List Patient Active Problem List   Diagnosis Date Noted  . Hypoglycemia   . Uncontrolled hypertension   . Hypertensive urgency   . Hypertensive crisis   . Acute blood loss anemia   . Hypokalemia   . Acute ischemic left MCA stroke ([place]) 07/12/2017  . Diabetes mellitus type 2 in obese ([place])   . Stage 3 chronic kidney disease ([place])   . Benign essential HTN   . Left middle cerebral artery stroke ([place]) 07/09/2017  . Acute embolic stroke ([place])   . Aphasia 07/08/2017  . Gout 07/21/2011  . Prostatitis, chronic 07/14/2010  . RENAL INSUFFICIENCY 02/05/2008  . Hyperlipidemia 06/18/2007  . DM type 2 (diabetes mellitus, type 2) ([place]) 01/02/2007    . Hypertensive kidney disease 01/02/2007    Willow Ora, PTA, [facility] [address], [place], [place] [address] [phone] 08/27/17, 3:26 PM   Name: [name] [name] MRN: [id number] Date of Birth: January 04, 1946

## 2017-08-29 ENCOUNTER — Encounter: Payer: Self-pay | Admitting: Adult Health

## 2017-08-29 NOTE — Telephone Encounter (Signed)
This encounter was created in error - please disregard.

## 2017-08-29 NOTE — Telephone Encounter (Signed)
Sent to the pharmacy by e-scribe for 30 days.  Pt has upcoming cpx on 09/19/17.

## 2017-08-29 NOTE — Telephone Encounter (Signed)
Copied from [place]. Topic: Quick Communication - Rx Refill/Question >> Aug 29, 2017  9:44 AM Synthia Innocent wrote: Medication: test strips for onetouch verio and hydromet cough syrup  Has the patient contacted their pharmacy? Yes.     (Agent: If no, request that the patient contact the pharmacy for the refill.)   Preferred Pharmacy (with phone number or street name): [facility] on [address]: Please be advised that RX refills may take up to 3 business days. We ask that you follow-up with your pharmacy.  Patient seen nephrologist and they wanted to put him back on lasix, wife refused, just wanted to make dr aware

## 2017-08-30 ENCOUNTER — Encounter: Payer: Self-pay | Admitting: Vascular Surgery

## 2017-08-30 ENCOUNTER — Other Ambulatory Visit: Payer: Self-pay

## 2017-08-30 ENCOUNTER — Telehealth: Payer: Self-pay | Admitting: Adult Health

## 2017-08-30 ENCOUNTER — Ambulatory Visit: Payer: Medicare Other | Admitting: Vascular Surgery

## 2017-08-30 VITALS — BP 146/83 | HR 64 | Temp 99.1°F | Resp 18 | Ht 75.0 in | Wt 224.0 lb

## 2017-08-30 DIAGNOSIS — I6522 Occlusion and stenosis of left carotid artery: Secondary | ICD-10-CM

## 2017-08-30 NOTE — Progress Notes (Signed)
Vitals:   08/30/17 0942 08/30/17 0948  BP: (!) 169/83 (!) 159/80  Pulse: 64 64  Resp: 18   Temp: 99.1 F (37.3 C)   TempSrc: Oral   SpO2: 99%   Weight: 224 lb (101.6 kg)   Height: 6\' 3"  (1.905 m)

## 2017-08-30 NOTE — Progress Notes (Signed)
Patient name: [name] [name] MRN: [id number] DOB: 1946/03/01 Sex: male   REASON FOR CONSULT:    Left internal carotid artery plaque.  Consult is requested by Delice Lesch, NP  HPI:   [name] [name] is a pleasant 72 y.o. male, who was admitted in January when he developed expressive a aphasia.  He underwent an extensive workup included a CT angiogram of the neck which suggested some plaque in the left internal carotid artery.  Duplex scan at that time was unremarkable.  He was set up for an outpatient evaluation.  The patient denies any prior history of stroke, TIAs, expressive or receptive aphasia or amaurosis fugax.  The wife reports that he had some paresthesias in his right leg recently but his blood sugar at that time was very low.  The patient is right-handed.  I reviewed the records from the referring office.  The patient was seen by [facility] primary care on 08/24/2017.  He has a history of diabetes, hyperlipidemia, hypertension, and renal insufficiency.  He is followed by nephrology.  Past Medical History:  Diagnosis Date  . DIABETES MELLITUS, TYPE II 01/02/2007  . HYPERLIPIDEMIA 06/18/2007  . HYPERTENSION 01/02/2007  . RENAL INSUFFICIENCY 02/05/2008    Family History  Problem Relation Age of Onset  . Hypertension Mother   . Diabetes Mother   . Stroke Father   . Stroke Maternal Grandmother   He states that his grandfather had a stroke at age 26.  He is unaware of any other history of premature cardiovascular disease.  SOCIAL HISTORY: He is not a smoker. Social History   Socioeconomic History  . Marital status: Married    Spouse name: Not on file  . Number of children: Not on file  . Years of education: Not on file  . Highest education level: Not on file  Social Needs  . Financial resource strain: Not on file  . Food insecurity - worry: Not on file  . Food insecurity - inability: Not on file  . Transportation needs - medical: Not on file  . Transportation needs -  non-medical: Not on file  Occupational History  . Not on file  Tobacco Use  . Smoking status: Never Smoker  . Smokeless tobacco: Never Used  Substance and Sexual Activity  . Alcohol use: No  . Drug use: No  . Sexual activity: Not on file  Other Topics Concern  . 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 Likes to go to football games.     Allergies  Allergen Reactions  . Bactrim [Sulfamethoxazole-Trimethoprim] Hives, Itching and Swelling  . Penicillins     Has patient had a PCN reaction causing immediate rash, facial/tongue/throat swelling, SOB or lightheadedness with hypotension: Unk Has patient had a PCN reaction causing severe rash involving mucus membranes or skin necrosis: Unk Has patient had a PCN reaction that required hospitalization: Unk Has patient had a PCN reaction occurring within the last 10 years: No If all of the above answers are "NO", then may proceed with Cephalosporin use.  . Statins     myalgia  . Sulfa Drugs Cross Reactors Hives, Itching and Swelling    Current Outpatient Medications  Medication Sig Dispense Refill  . aspirin 325 MG EC tablet Take 1 tablet (325 mg total)  by mouth daily. 30 tablet 0  . benazepril (LOTENSIN) 40 MG tablet Take 1 tablet (40 mg total) by mouth 2 (two) times daily. 60 tablet 0  . benazepril (LOTENSIN) 40 MG tablet TAKE 1 TABLET(40 MG) BY MOUTH TWICE DAILY 60 tablet 0  . calcitRIOL (ROCALTROL) 0.25 MCG capsule Take 1 capsule by mouth 3 (three) times daily.     Marland Kitchen ezetimibe (ZETIA) 10 MG tablet Take 1 tablet (10 mg total) by mouth daily. 30 tablet 1  . glucose blood (ONETOUCH VERIO) test strip 1 each by Other route daily. Use as instructed 100 each 11  . hydrALAZINE (APRESOLINE) 50 MG tablet Take 1 tablet (50 mg total) by mouth 3 (three) times daily. (Patient taking differently: Take  50 mg by mouth 2 (two) times daily. ) 90 tablet 0  . Insulin Glargine (LANTUS SOLOSTAR) 100 UNIT/ML Solostar Pen Inject 18 Units into the skin daily at 10 pm. INJECT 20 UNITS SUBCUTANEOUSLY ONCE DAILY (Patient taking differently: Inject 18 Units into the skin daily at 10 pm. ) 2 pen 6  . labetalol (NORMODYNE) 200 MG tablet Take 0.5 tablets (100 mg total) by mouth 2 (two) times daily. 120 tablet 0  . nateglinide (STARLIX) 60 MG tablet Take 1 tablet (60 mg total) by mouth 3 (three) times daily with meals. (Patient taking differently: Take 60 mg by mouth every evening. ) 90 tablet 0  . hydrALAZINE (APRESOLINE) 25 MG tablet TK 1 T PO TID IN ADDITION TO 50MG  TABLET FOR COMBINED DOSE OF 75MG   0   No current facility-administered medications for this visit.     REVIEW OF SYSTEMS:  [X]  denotes positive finding, [ ]  denotes negative finding Cardiac  Comments:  Chest pain or chest pressure:    Shortness of breath upon exertion:    Short of breath when lying flat:    Irregular heart rhythm:        Vascular    Pain in calf, thigh, or hip brought on by ambulation:    Pain in feet at night that wakes you up from your sleep:     Blood clot in your veins:    Leg swelling:         Pulmonary    Oxygen at home:    Productive cough:  x   Wheezing:         Neurologic    Sudden weakness in arms or legs:     Sudden numbness in arms or legs:     Sudden onset of difficulty speaking or slurred speech: x   Temporary loss of vision in one eye:     Problems with dizziness:         Gastrointestinal    Blood in stool:     Vomited blood:         Genitourinary    Burning when urinating:     Blood in urine:        Psychiatric    Major depression:         Hematologic    Bleeding problems:    Problems with blood clotting too easily:        Skin    Rashes or ulcers:        Constitutional    Fever or chills:     PHYSICAL EXAM:   Vitals:   08/30/17 0942 08/30/17 0948 08/30/17 0949  BP: (!)  169/83 (!) 159/80 (!) 146/83  Pulse: 64 64 64  Resp: 18    Temp: 99.1  F (37.3 C)    TempSrc: Oral    SpO2: 99%    Weight: 224 lb (101.6 kg)    Height: 6\' 3"  (1.905 m)      GENERAL: The patient is a well-nourished male, in no acute distress. The vital signs are documented above. CARDIAC: There is a regular rate and rhythm.  VASCULAR: I do not detect carotid bruits. He has palpable femoral and posterior tibial pulses bilaterally. PULMONARY: There is good air exchange bilaterally without wheezing or rales. ABDOMEN: Soft and non-tender with normal pitched bowel sounds.  MUSCULOSKELETAL: There are no major deformities or cyanosis. NEUROLOGIC: No focal weakness or paresthesias are detected. SKIN: There are no ulcers or rashes noted. PSYCHIATRIC: The patient has a normal affect.  DATA:    CAROTID DUPLEX: I have reviewed the carotid duplex scan that was done on 07/09/2017.  This shows a less than 39% carotid stenosis bilaterally.  There is minimal plaque noted.  Both vertebral arteries are patent with antegrade flow.  CT ANGIOGRAM OF THE NECK: I reviewed the CT angiogram of the neck that was done on 07/08/2017.  This shows a plaque extending approximately 2 cm up into the left internal carotid artery.  CT of the head did not show evidence of a stroke.  MEDICAL ISSUES:   PLAQUE IN THE LEFT INTERNAL CAROTID ARTERY: I have reviewed the images of his CT angiogram and clearly he has some plaque in the left internal carotid artery that could potentially have caused his symptoms.  He was not on aspirin at the time and has been started on aspirin.  He is also on a statin.  Normally I would consider a cerebral arteriogram to further evaluate the stenosis but the patient has chronic kidney disease with a creatinine of 2.7.  This could certainly worsen his renal function and worse case scenario put him on dialysis.  By duplex, the stenosis does not produce significantly elevated velocities if he had  recurrent symptoms we would have to consider cerebral arteriography however again with his renal failure this could potentially result in renal failure.  If he had a symptomatic stenosis given the tortuosity distal to the area of plaque he may not be a candidate for a stent.  In addition, given that the plaque extends quite high with tortuosity above that, I do not think that this lesion is surgically accessible.  For all of these reasons, if he has recurrent symptoms I think perhaps Plavix should be added to maximize his medical therapy.  We have also discussed the importance of nutrition and exercise today in the office today.  I have ordered a follow-up carotid duplex scan in 6 months and I will see him at that time.  He knows to call sooner if he has problems.  Deitra Mayo [facility] [phone]

## 2017-08-30 NOTE — Telephone Encounter (Signed)
Left a message for a return call.

## 2017-08-30 NOTE — Telephone Encounter (Signed)
      Copied from [place]. Topic: Quick Communication - Rx Refill/Question >> Aug 29, 2017  9:44 AM Synthia Innocent wrote: Medication: test strips for onetouch verio and hydromet cough syrup  Has the patient contacted their pharmacy? Yes.     (Agent: If no, request that the patient contact the pharmacy for the refill.)   Preferred Pharmacy (with phone number or street name): [facility] [address]: Please be advised that RX refills may take up to 3 business days. We ask that you follow-up with your pharmacy.  Patient seen nephrologist and they wanted to put him back on lasix, wife refused, just wanted to make dr aware

## 2017-08-30 NOTE — Progress Notes (Signed)
One touch has been filled and hydromet has been sent back to provider to fill

## 2017-08-30 NOTE — Telephone Encounter (Signed)
Hydromet OV: 07/31/17 PCP: [facility] [address] is not listed on med list

## 2017-08-31 ENCOUNTER — Telehealth: Payer: Self-pay | Admitting: Adult Health

## 2017-08-31 NOTE — Telephone Encounter (Signed)
Copied from [place]. Topic: Quick Communication - Rx Refill/Question >> Aug 31, 2017  8:47 AM Lolita Rieger, RMA wrote: Pt returning call please call [phone]

## 2017-08-31 NOTE — Telephone Encounter (Signed)
Tried to return call and did not reach [name] (wife).  Will try again at a later time.

## 2017-08-31 NOTE — Telephone Encounter (Signed)
Duplicate message. 

## 2017-08-31 NOTE — Telephone Encounter (Signed)
Copied from [place]. Topic: Quick Communication - Rx Refill/Question >> Aug 31, 2017  8:47 AM Lolita Rieger, RMA wrote: Pt returning call please call [phone]

## 2017-08-31 NOTE — Telephone Encounter (Signed)
Copied from [place]. Topic: Quick Communication - Rx Refill/Question >> Aug 31, 2017  8:47 AM Lolita Rieger, RMA wrote: Pt returning call please call [phone]

## 2017-09-01 ENCOUNTER — Ambulatory Visit: Payer: Medicare Other | Attending: Physical Medicine and Rehabilitation | Admitting: Occupational Therapy

## 2017-09-01 ENCOUNTER — Ambulatory Visit: Payer: Medicare Other | Admitting: Rehabilitation

## 2017-09-01 ENCOUNTER — Encounter: Payer: Self-pay | Admitting: Rehabilitation

## 2017-09-01 ENCOUNTER — Other Ambulatory Visit: Payer: Self-pay | Admitting: Adult Health

## 2017-09-01 DIAGNOSIS — R41844 Frontal lobe and executive function deficit: Secondary | ICD-10-CM | POA: Diagnosis present

## 2017-09-01 DIAGNOSIS — R2689 Other abnormalities of gait and mobility: Secondary | ICD-10-CM | POA: Diagnosis present

## 2017-09-01 DIAGNOSIS — R2681 Unsteadiness on feet: Secondary | ICD-10-CM

## 2017-09-01 DIAGNOSIS — E119 Type 2 diabetes mellitus without complications: Secondary | ICD-10-CM

## 2017-09-01 DIAGNOSIS — R414 Neurologic neglect syndrome: Secondary | ICD-10-CM | POA: Diagnosis present

## 2017-09-01 DIAGNOSIS — R41842 Visuospatial deficit: Secondary | ICD-10-CM | POA: Diagnosis present

## 2017-09-01 DIAGNOSIS — R41840 Attention and concentration deficit: Secondary | ICD-10-CM | POA: Insufficient documentation

## 2017-09-01 MED ORDER — HYDROCODONE-HOMATROPINE 5-1.5 MG/5ML PO SYRP: 5.0000 mL | ORAL_SOLUTION | Freq: Three times a day (TID) | ORAL | 0 refills | Status: DC | PRN

## 2017-09-01 MED ORDER — GLUCOSE BLOOD VI STRP: 1.0000 | ORAL_STRIP | Freq: Two times a day (BID) | 6 refills | Status: DC

## 2017-09-01 NOTE — Telephone Encounter (Signed)
Pt seen on 08/24/17.  Cough is about the same as when seen.  Cough is not productive.  No fever or SOB.  Cough becomes worse in the evening and at night.    Needs a new prescription for test strips to say [facility] daily to [facility].   Dr. Detterding wants to leave hydralazine at 2 doses per day and add furosemide.  Pt has had trouble with this in the past per wife.  She has picked up furosemide rx but has not given until she speaks with someone in [name]'s office.  Going to see Dr. Posey Pronto on 09/07/17.

## 2017-09-01 NOTE — Therapy (Signed)
[facility] [address] [place], [place], [address] Phone: [phone]   Fax:  [phone]  Occupational Therapy Treatment  Patient Details  Name: [name] [name] MRN: [id number] Date of Birth: [birth year] Referring Provider: Reesa Chew, PA-C (f/u with Dr. Posey Pronto)   Encounter Date: 09/01/2017  OT End of Session - 09/01/17 1227    Visit Number  9    Number of Visits  17    Date for OT Re-Evaluation  09/15/17    Authorization Type  UHC Medicare    Authorization Time Period  cert. date 07/17/17-09/15/17    OT Start Time  1105    OT Stop Time  1145    OT Time Calculation (min)  40 min    Activity Tolerance  Patient tolerated treatment well    Behavior During Therapy  WFL for tasks assessed/performed       Past Medical History:  Diagnosis Date  . DIABETES MELLITUS, TYPE II 01/02/2007  . HYPERLIPIDEMIA 06/18/2007  . HYPERTENSION 01/02/2007  . RENAL INSUFFICIENCY 02/05/2008    Past Surgical History:  Procedure Laterality Date  . LOOP RECORDER INSERTION N/A 07/11/2017   Procedure: LOOP RECORDER INSERTION;  Surgeon: Thompson Grayer, MD;  Location: [facility];  Service: Cardiovascular;  Laterality: N/A;  . SHOULDER SURGERY     left  . TEE WITHOUT CARDIOVERSION N/A 07/11/2017   Procedure: TRANSESOPHAGEAL ECHOCARDIOGRAM (TEE);  Surgeon: Dorothy Spark, MD;  Location: [facility];  Service: Cardiovascular;  Laterality: N/A;  . TRANSURETHRAL RESECTION OF PROSTATE     history of retention/hematuria     There were no vitals filed for this visit.  Subjective Assessment - 09/01/17 1139    Subjective   Deneis pain    Patient Stated Goals  improve speech, improve R side attention (needed prompting)              Treatment: Number cancelllation task to mark repeating item then count number of times it repeats 100% accuracy Scheduling task with good accuracy to place items into a schedule, only 1 duplication of times,  pt completed fully and marked off the items he had already included in schedule, improved overall performance. Environmental scanning while walking and tossing ball, all items located.               OT Short Term Goals - 08/17/17 0858      OT SHORT TERM GOAL #1   Title  Pt will be independent with HEP for cognitive/visual deficits.--check STGs 08/18/17    Time  4    Period  Weeks    Status  Achieved      OT SHORT TERM GOAL #2   Title  Pt will perform complex tabletop visual scanning with at least 95% accuracy.    Time  4    Period  Weeks    Status  On-going 08/17/17:  not consistent      OT SHORT TERM GOAL #3   Title  Pt will attend to functional task for at least 38mn in busy environment.    Time  4    Period  Weeks    Status  Achieved      OT SHORT TERM GOAL #4   Title  Pt will perform mod complex organization tasks with at least 75% accuracy.    Time  4    Period  Weeks    Status  On-going 08/17/17:  met with simple organization tasks at approx this level,  but not mod complex        OT Long Term Goals - 07/17/17 1523      OT LONG TERM GOAL #1   Title  Pt will perform mod complex environmental scanning with at least 90% accuracy.    Time  8    Period  Weeks    Status  New      OT LONG TERM GOAL #2   Title  Pt will perform mod complex organization tasks with at least 90% accuracy.    Time  8    Period  Weeks    Status  New      OT LONG TERM GOAL #3   Title  Pt will divide attention between cognitive and physical task with at least 85% accuracy for improved safety with community activities/in prep for driving.    Time  8    Period  Weeks    Status  New            Plan - 09/01/17 1224    Clinical Impression Statement  pt is progressing towards goals with improving cognitive and visual perceptual skills.    Rehab Potential  Good    OT Frequency  2x / week    OT Duration  8 weeks    OT Treatment/Interventions  Self-care/ADL training;Moist  Heat;DME and/or AE instruction;Therapeutic activities;Cognitive remediation/compensation;Therapeutic exercise;Neuromuscular education;Cryotherapy;Functional Mobility Training;Passive range of motion;Visual/perceptual remediation/compensation;Patient/family education;Energy conservation;Manual Therapy    Plan  continue to address visual scanning and cognition in a functional context; environmental scanning with sequential organization    Consulted and Agree with Plan of Care  Patient;Family member/caregiver       Patient will benefit from skilled therapeutic intervention in order to improve the following deficits and impairments:  Decreased cognition, Impaired vision/preception, Decreased coordination, Impaired perceived functional ability, Decreased safety awareness, Decreased knowledge of precautions  Visit Diagnosis: No diagnosis found.    Problem List Patient Active Problem List   Diagnosis Date Noted  . Hypoglycemia   . Uncontrolled hypertension   . Hypertensive urgency   . Hypertensive crisis   . Acute blood loss anemia   . Hypokalemia   . Acute ischemic left MCA stroke ([place]) 07/12/2017  . Diabetes mellitus type 2 in obese ([place])   . Stage 3 chronic kidney disease ([place])   . Benign essential HTN   . Left middle cerebral artery stroke ([place]) 07/09/2017  . Acute embolic stroke ([place])   . Aphasia 07/08/2017  . Gout 07/21/2011  . Prostatitis, chronic 07/14/2010  . RENAL INSUFFICIENCY 02/05/2008  . Hyperlipidemia 06/18/2007  . DM type 2 (diabetes mellitus, type 2) ([place]) 01/02/2007  . Hypertensive kidney disease 01/02/2007    Zanylah Hardie 09/01/2017, 12:28 PM  [facility] [address] [place], [place], [address] Phone: [phone]   Fax:  [phone]  Name: [name] [name] MRN: [id number] Date of Birth: Mar 22, 1946

## 2017-09-01 NOTE — Therapy (Signed)
[facility] [address] [place], [place], [address] Phone: [phone]   Fax:  [phone]  Physical Therapy Treatment and DC Summary   Patient Details  Name: [name] [name] MRN: [id number] Date of Birth: 07-Mar-1946 Referring Provider: Reesa Chew, PA-C (f/u with Dr. Posey Pronto)   Encounter Date: 09/01/2017  PT End of Session - 09/01/17 1252    Visit Number  6    Number of Visits  6    Date for PT Re-Evaluation  09/02/17    Authorization Type  [employer] medicare $40 copay for PT and ST combined    PT Start Time  1148    PT Stop Time  1230    PT Time Calculation (min)  42 min    Equipment Utilized During Treatment  Gait belt    Activity Tolerance  Patient tolerated treatment well    Behavior During Therapy  [facility] for tasks assessed/performed minimal verbal interaction due to speech deficits       Past Medical History:  Diagnosis Date  . DIABETES MELLITUS, TYPE II 01/02/2007  . HYPERLIPIDEMIA 06/18/2007  . HYPERTENSION 01/02/2007  . RENAL INSUFFICIENCY 02/05/2008    Past Surgical History:  Procedure Laterality Date  . LOOP RECORDER INSERTION N/A 07/11/2017   Procedure: LOOP RECORDER INSERTION;  Surgeon: Thompson Grayer, MD;  Location: [facility];  Service: Cardiovascular;  Laterality: N/A;  . SHOULDER SURGERY     left  . TEE WITHOUT CARDIOVERSION N/A 07/11/2017   Procedure: TRANSESOPHAGEAL ECHOCARDIOGRAM (TEE);  Surgeon: Dorothy Spark, MD;  Location[facility]COPY;  Service: Cardiovascular;  Laterality: N/A;  . TRANSURETHRAL RESECTION OF PROSTATE     history of retention/hematuria     There were no vitals filed for this visit.  Subjective Assessment - 09/01/17 1154    Subjective  Pt reports no changes.  They plan to go back to their fitness club, are waiting for better rates.     Pertinent History  diabetes, renal insufficiency, HTN, h/o L shoulder surgery     Patient Stated Goals  Unsure- hard to vocalize due to  speech difficulties.     Currently in Pain?  No/denies         [facility] PT Assessment - 09/01/17 0001      Functional Gait  Assessment   Gait assessed   Yes    Gait Level Surface  Walks 20 ft in less than 5.5 sec, no assistive devices, good speed, no evidence for imbalance, normal gait pattern, deviates no more than 6 in outside of the 12 in walkway width.    Change in Gait Speed  Able to smoothly change walking speed without loss of balance or gait deviation. Deviate no more than 6 in outside of the 12 in walkway width.    Gait with Horizontal Head Turns  Performs head turns smoothly with no change in gait. Deviates no more than 6 in outside 12 in walkway width    Gait with Vertical Head Turns  Performs head turns with no change in gait. Deviates no more than 6 in outside 12 in walkway width.    Gait and Pivot Turn  Pivot turns safely within 3 sec and stops quickly with no loss of balance.    Step Over Obstacle  Is able to step over 2 stacked shoe boxes taped together (9 in total height) without changing gait speed. No evidence of imbalance.    Gait with Narrow Base of Support  Is able to  ambulate for 10 steps heel to toe with no staggering.    Gait with Eyes Closed  Walks 20 ft, slow speed, abnormal gait pattern, evidence for imbalance, deviates 10-15 in outside 12 in walkway width. Requires more than 9 sec to ambulate 20 ft.    Ambulating Backwards  Walks 20 ft, uses assistive device, slower speed, mild gait deviations, deviates 6-10 in outside 12 in walkway width.    Steps  Alternating feet, no rail.    Total Score  27    FGA comment:  Interpretation: 25-28 = low risk fall                   [facility] Adult PT Treatment/Exercise - 09/01/17 1157      Berg Balance Test   Sit to Stand  Able to stand without using hands and stabilize independently    Standing Unsupported  Able to stand safely 2 minutes    Sitting with Back Unsupported but Feet Supported on Floor or Stool  Able to sit  safely and securely 2 minutes    Stand to Sit  Sits safely with minimal use of hands    Transfers  Able to transfer safely, minor use of hands    Standing Unsupported with Eyes Closed  Able to stand 10 seconds safely    Standing Ubsupported with Feet Together  Able to place feet together independently and stand 1 minute safely    From Standing, Reach Forward with Outstretched Arm  Can reach confidently >25 cm (10")    From Standing Position, Pick up Object from Floor  Able to pick up shoe safely and easily    From Standing Position, Turn to Look Behind Over each Shoulder  Looks behind from both sides and weight shifts well    Turn 360 Degrees  Able to turn 360 degrees safely in 4 seconds or less    Standing Unsupported, Alternately Place Feet on Step/Stool  Able to stand independently and safely and complete 8 steps in 20 seconds    Standing Unsupported, One Foot in Front  Able to place foot tandem independently and hold 30 seconds    Standing on One Leg  Able to lift leg independently and hold > 10 seconds on both legs    Total Score  56         TE:  Pt able to perform 5 mins on elliptical at level 1.5.  Educated to begin slowly at gym and do 1-2 min warm up, 3-4 mins of increased effort workout, then 2 mins cool down at the end.  Pt and spouse verbalized understanding.     PT Education - 09/01/17 1251    Education provided  Yes    Education Details  beginning to use elliptical at gym    Person(s) Educated  Patient;Spouse    Methods  Explanation    Comprehension  Verbalized understanding       PT Short Term Goals - 07/19/17 1044      PT SHORT TERM GOAL #1   Title  The patient will return demo HEP for high level balance and dynamic gait activities.    Time  4    Period  Weeks    Target Date  08/18/17      PT SHORT TERM GOAL #2   Title  The patient will improve FGA from 21/30 to > or equal to 24/30 to demonstrate improved dynamic gait abilities for community mobility.    Time   4  Period  Weeks    Target Date  08/18/17      PT SHORT TERM GOAL #3   Title  The patient will return to modified gym routine at the [employer] (initially recommending walking on indoor track) to include cardio, strengthening.    Time  4    Period  Weeks    Target Date  08/18/17      PT SHORT TERM GOAL #4   Title  --        PT Long Term Goals - 09/01/17 1154      PT LONG TERM GOAL #1   Title  The patient will be independent with post d/c wellness program.    Baseline  Planning to return to their fitness club in the next week or so.  Waiting for better rates.     Time  6    Period  Weeks    Status  Achieved      PT LONG TERM GOAL #2   Title  The patient will improve FGA from 21/30 to > or equal to 26/30 to demonstrate improving dynamic mobility.    Baseline  27/30 met    Time  6    Period  Weeks    Status  Achieved      PT LONG TERM GOAL #3   Title  The patient will improve Berg from 49/56 to > or equal to 53/56 to demonstrate improved narrow base of support balance/    Baseline  56/56    Time  6    Period  Weeks    Status  Achieved            Plan - 09/01/17 1253    Clinical Impression Statement  Skilled session focused on assessment of LTGs and assessment of pts use of elliptical for return to gym next week.  Pt has met all LTGs and demonstrated safe use of elliptical.  Provided education on beginning slow with 5-6 mins (including warm up/cool down).  Pt and spouse verbalized understanding.     Rehab Potential  Good    PT Frequency  1x / week    PT Duration  6 weeks    PT Treatment/Interventions  ADLs/Self Care Home Management;Balance training;Neuromuscular re-education;Patient/family education;Gait training;Functional mobility training;Therapeutic activities;Therapeutic exercise    Consulted and Agree with Plan of Care  Patient;Family member/caregiver    Family Member Consulted  spouse       Patient will benefit from skilled therapeutic intervention in order to  improve the following deficits and impairments:  Decreased activity tolerance, Decreased balance, Difficulty walking  Visit Diagnosis: Unsteadiness on feet  Other abnormalities of gait and mobility    PHYSICAL THERAPY DISCHARGE SUMMARY  Visits from Start of Care: 6  Current functional level related to goals / functional outcomes: See LTGs   Remaining deficits: Pt with high level balance deficits.  Also continues to demonstrate cognitive deficits that are being addressed in OT/SLP.    Education / Equipment: HEP  Plan: Patient agrees to discharge.  Patient goals were met. Patient is being discharged due to meeting the stated rehab goals.  ?????       Problem List Patient Active Problem List   Diagnosis Date Noted  . Hypoglycemia   . Uncontrolled hypertension   . Hypertensive urgency   . Hypertensive crisis   . Acute blood loss anemia   . Hypokalemia   . Acute ischemic left MCA stroke ([place]) 07/12/2017  . Diabetes mellitus type 2 in obese (  [place])   . Stage 3 chronic kidney disease ([place])   . Benign essential HTN   . Left middle cerebral artery stroke ([place]) 07/09/2017  . Acute embolic stroke ([place])   . Aphasia 07/08/2017  . Gout 07/21/2011  . Prostatitis, chronic 07/14/2010  . RENAL INSUFFICIENCY 02/05/2008  . Hyperlipidemia 06/18/2007  . DM type 2 (diabetes mellitus, type 2) ([place]) 01/02/2007  . Hypertensive kidney disease 01/02/2007    Cameron Sprang, PT, MPT [facility] [address] [place], [place], [address] Phone: [phone]   Fax:  [phone] 09/01/17, 12:56 PM  Name: [name] [name] MRN: [id number] Date of Birth: [birth year]

## 2017-09-04 ENCOUNTER — Encounter: Payer: Self-pay | Admitting: Occupational Therapy

## 2017-09-05 LAB — CUP PACEART REMOTE DEVICE CHECK
Date Time Interrogation Session: 20190207153548
MDC IDC PG IMPLANT DT: 20190108

## 2017-09-05 NOTE — Therapy (Signed)
[facility] [address] [place], [place], [address] Phone: [phone]   Fax:  [phone]  Patient Details  Name: [name] [name] MRN: [id number] Date of Birth: 1945/08/06 Referring Provider:  Lorie Phenix Ankit  Encounter Date: 09/05/2017  SPEECH THERAPY DISCHARGE SUMMARY  Visits from Start of Care: (8, including evaluation)  Current functional level related to goals / functional outcomes: Pt underwent 7 ST sessions targeting cognitive deficits, and receptive and expressive aphasia. Pt with slower than normal language processing speed, differing than from premorbid status.   Pt was not easily able to use/did not use compensatory strategies for attention and memory indicated in the same session.  He called clinic on 09-04-17 and cancelled all remaining ST and OT, indicating he no longer req'd therapy. This is a good example of pt's reduced insight/awareness, as he requires skilled ST/OT to improve his current abilities.   Remaining deficits: Impaired cognitive deficits, at least including memory, attention, and awareness/insight; All would hinder pt's ability to operate a motor vehicle at this time. Expressive and receptive language deficits. Slow linguistic processing. Goals/updates at the time of last scheduled ST were as follows:  SLP Short Term Goals - 08/25/17 1503              SLP SHORT TERM GOAL #1    Title  pt will demonstrate word finding compensations in 7 minutes simple conversation with rare min A for their use     Status  Achieved          SLP SHORT TERM GOAL #2    Title  pt will undergo cognitvie linguistic testing and goals to be added PRN     Status  Achieved          SLP SHORT TERM GOAL #3    Title  pt will achieve 85% success with simple word finding tasks over 3 sessions     Baseline  08/17/17     Status  Partially Met          SLP SHORT TERM GOAL #4    Title  pt will demo understanding of mod  complex directions 85% success over three sessions     Status  Not Met          SLP SHORT TERM GOAL #5    Title  pt will indicate 1 cognitive deficit     Status  Achieved          SLP SHORT TERM GOAL #6    Title  pt will demonstrate appropriate selective attention in 5 minutes of therapy tasks     Status  Not Met          SLP SHORT TERM GOAL #7    Title  pt will initiate use of memory enhancing system     Status  Achieved              SLP Long Term Goals - 08/25/17 1504              SLP LONG TERM GOAL #1    Title  pt will functionally use compensatory strategies for anomia in 8 minutes simple to mod complex conversation     Time  4     Period  Weeks     Status  Not met          SLP LONG TERM GOAL #2    Title  pt will complete mod complex naming tasks with 90% success with modified  independence (use of compensations)     Time  4     Period  Weeks     Status  Not met         SLP LONG TERM GOAL #3    Title  pt will demo functional comprehension in 10 minutes simple to mod complex convresation over three sessions     Time  4     Period  Weeks     Status Not met         SLP LONG TERM GOAL #4    Title  pt will demo selective attention x3 in 10 minutes of therapy tasks in a min noisy environment      Time  4     Period  Weeks     Status  Not met          SLP LONG TERM GOAL #5    Title  pt will exhibit functional emergent awareness in min-mod complex cognitive linguistic therapy tasks     Time  4     Period  Weeks     Status  Not met         SLP LONG TERM GOAL #7    Title  pt will reportedly use memory enhancement system appropriately, with cues, in or between 5 therapy sessions     Time  4     Period  Weeks     Status  Not met       Education / Equipment: Compensations, education to increase pt's awareness of his deficits.  Plan: Patient agrees to discharge.  Patient goals were not met. Patient is being discharged due to the patient's request.  ?????Recommend  formal driving eval if pt desires to return to driving.       [facility] ,[place], [place]  09/05/2017, 8:13 AM  [facility] [address] [place], [place], [address] Phone: [phone]   Fax:  [phone]

## 2017-09-06 ENCOUNTER — Encounter: Payer: Self-pay | Admitting: Physical Medicine & Rehabilitation

## 2017-09-06 ENCOUNTER — Encounter: Payer: Medicare Other | Attending: Physical Medicine & Rehabilitation | Admitting: Physical Medicine & Rehabilitation

## 2017-09-06 ENCOUNTER — Other Ambulatory Visit: Payer: Self-pay | Admitting: Adult Health

## 2017-09-06 VITALS — BP 177/91 | HR 68 | Resp 14

## 2017-09-06 DIAGNOSIS — N183 Chronic kidney disease, stage 3 unspecified: Secondary | ICD-10-CM

## 2017-09-06 DIAGNOSIS — E785 Hyperlipidemia, unspecified: Secondary | ICD-10-CM | POA: Diagnosis not present

## 2017-09-06 DIAGNOSIS — I6522 Occlusion and stenosis of left carotid artery: Secondary | ICD-10-CM | POA: Diagnosis not present

## 2017-09-06 DIAGNOSIS — I69351 Hemiplegia and hemiparesis following cerebral infarction affecting right dominant side: Secondary | ICD-10-CM | POA: Insufficient documentation

## 2017-09-06 DIAGNOSIS — I69322 Dysarthria following cerebral infarction: Secondary | ICD-10-CM | POA: Diagnosis not present

## 2017-09-06 DIAGNOSIS — Z833 Family history of diabetes mellitus: Secondary | ICD-10-CM | POA: Diagnosis not present

## 2017-09-06 DIAGNOSIS — Z794 Long term (current) use of insulin: Secondary | ICD-10-CM | POA: Diagnosis not present

## 2017-09-06 DIAGNOSIS — Z9889 Other specified postprocedural states: Secondary | ICD-10-CM | POA: Diagnosis not present

## 2017-09-06 DIAGNOSIS — R4701 Aphasia: Secondary | ICD-10-CM | POA: Diagnosis not present

## 2017-09-06 DIAGNOSIS — E1122 Type 2 diabetes mellitus with diabetic chronic kidney disease: Secondary | ICD-10-CM | POA: Diagnosis not present

## 2017-09-06 DIAGNOSIS — Z8249 Family history of ischemic heart disease and other diseases of the circulatory system: Secondary | ICD-10-CM | POA: Insufficient documentation

## 2017-09-06 DIAGNOSIS — I6930 Unspecified sequelae of cerebral infarction: Secondary | ICD-10-CM

## 2017-09-06 DIAGNOSIS — I129 Hypertensive chronic kidney disease with stage 1 through stage 4 chronic kidney disease, or unspecified chronic kidney disease: Secondary | ICD-10-CM | POA: Diagnosis not present

## 2017-09-06 DIAGNOSIS — I10 Essential (primary) hypertension: Secondary | ICD-10-CM

## 2017-09-06 DIAGNOSIS — Z823 Family history of stroke: Secondary | ICD-10-CM | POA: Insufficient documentation

## 2017-09-06 DIAGNOSIS — I63512 Cerebral infarction due to unspecified occlusion or stenosis of left middle cerebral artery: Secondary | ICD-10-CM | POA: Diagnosis not present

## 2017-09-06 MED ORDER — EZETIMIBE 10 MG PO TABS: 10.0000 mg | ORAL_TABLET | Freq: Every day | ORAL | 1 refills | Status: DC

## 2017-09-06 NOTE — Progress Notes (Signed)
Subjective:    Patient ID: [name] [name], male    DOB: 1945-07-18, 72 y.o.   MRN: [id number]  HPI 72 y.o. male with history of HTN, T2DM presents for follow up for left MCA infarct.  Last clinic visit 07/27/17.  Wife provides much of history. Since that time, he has been having issues with his BP and having his meds adjusted by Nephro. Received communication from Vascular, intervention not planned due to risks>benefit.  CBGs have been controlled. Completed PT, further SLP cancelled per pt. Wife doing exercises at home. Cont HEP.  Denies falls.   Pain Inventory Average Pain 0 Pain Right Now 0 My pain is no pain  In the last 24 hours, has pain interfered with the following? General activity 0 Relation with others 0 Enjoyment of life 0 What TIME of day is your pain at its worst? no pain Sleep (in general) Good  Pain is worse with: no pain Pain improves with: no pain Relief from Meds: no pain  Mobility walk without assistance how many minutes can you walk? 15 ability to climb steps?  yes do you drive?  no Do you have any goals in this area?  yes  Function retired Do you have any goals in this area?  yes  Neuro/Psych spasms  Prior Studies Any changes since last visit?  no  Physicians involved in your care Any changes since last visit?  no   Family History  Problem Relation Age of Onset  . Hypertension Mother   . Diabetes Mother   . Stroke Father   . Stroke Maternal Grandmother    Social History   Socioeconomic History  . Marital status: Married    Spouse name: None  . Number of children: None  . Years of education: None  . Highest education level: None  Social Needs  . Financial resource strain: None  . Food insecurity - worry: None  . Food insecurity - inability: None  . Transportation needs - medical: None  . Transportation needs - non-medical: None  Occupational History  . None  Tobacco Use  . Smoking status: Never Smoker  . Smokeless tobacco:  Never Used  Substance and Sexual Activity  . Alcohol use: No  . Drug use: No  . Sexual activity: None  Other Topics Concern  . 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 Likes to go to football games.    Past Surgical History:  Procedure Laterality Date  . LOOP RECORDER INSERTION N/A 07/11/2017   Procedure: LOOP RECORDER INSERTION;  Surgeon: Thompson Grayer, MD;  Location: [facility];  Service: Cardiovascular;  Laterality: N/A;  . SHOULDER SURGERY     left  . TEE WITHOUT CARDIOVERSION N/A 07/11/2017   Procedure: TRANSESOPHAGEAL ECHOCARDIOGRAM (TEE);  Surgeon: Dorothy Spark, MD;  Location: [facility];  Service: Cardiovascular;  Laterality: N/A;  . TRANSURETHRAL RESECTION OF PROSTATE     history of retention/hematuria    Past Medical History:  Diagnosis Date  . DIABETES MELLITUS, TYPE II 01/02/2007  . HYPERLIPIDEMIA 06/18/2007  . HYPERTENSION 01/02/2007  . RENAL INSUFFICIENCY 02/05/2008   BP (!) 177/91 (BP Location: Left Arm, Patient Position: Sitting, Cuff Size: Normal)   Pulse 68   Resp 14   SpO2 98%   Opioid Risk Score:  Fall Risk Score:  `1  Depression screen PHQ 2/9  Depression screen [facility] 2/9 07/27/2017 04/08/2015 04/02/2013  Decreased Interest 3 0 0  Down, Depressed, Hopeless 1 0 0  PHQ - 2 Score 4 0 0  Altered sleeping 0 - -  Tired, decreased energy 1 - -  Change in appetite 0 - -  Feeling bad or failure about yourself  1 - -  Trouble concentrating 1 - -  Moving slowly or fidgety/restless 1 - -  Suicidal thoughts 0 - -  PHQ-9 Score 8 - -  Difficult doing work/chores Somewhat difficult - -     Review of Systems  Constitutional: Negative.   HENT: Negative.   Eyes: Negative.   Respiratory: Negative.   Cardiovascular: Negative.   Gastrointestinal: Negative.   Endocrine: Negative.   Genitourinary: Negative.     Musculoskeletal: Positive for gait problem.       Spasms   Skin: Negative.   Allergic/Immunologic: Negative.   Neurological: Negative for numbness.  Hematological: Negative.   Psychiatric/Behavioral: Negative.   All other systems reviewed and are negative.      Objective:   Physical Exam Constitutional: He appears well-developed and well-nourished.  HENT: Normocephalic and atraumatic.  Eyes: EOM are normal. No discharge.  Cardiovascular: RRR. No JVD. Respiratory: Effort normal and breath sounds normal.  GI: Bowel sounds are normal. There is no tenderness.  Musculoskeletal: He exhibits no edema, no tenderness.  Neurological: He is alert and oriented.  Motor: RUE: 5/5 prox to distal.  RLE: 5/5 proximal to distal Skin: Skin is warm and dry.  Psychiatric: Flat.    Assessment & Plan:  72 y.o. male with history of HTN, T2DM presents for follow up for left MCA infarct.  1. Right hemparesis and aphasia secondary to left MCA infarct  Cont HEP  Follow up with Neurology  2. HTN  Remains elevated  Meds being adjusted by Nephro, encouraged follow up for further adjustments  3. Left ICA plaque:    Saw Vascular, no plans for intervention risks>benefits  Follow up in 6 months.  4. DM   Controlled at present  Cont checks  5. Dysarthria   Cont home exercises, chose to stop SLP

## 2017-09-07 ENCOUNTER — Ambulatory Visit: Payer: Medicare Other | Admitting: Physical Medicine & Rehabilitation

## 2017-09-08 ENCOUNTER — Encounter: Payer: Medicare Other | Admitting: Occupational Therapy

## 2017-09-11 ENCOUNTER — Telehealth: Payer: Self-pay | Admitting: Adult Health

## 2017-09-11 DIAGNOSIS — N183 Chronic kidney disease, stage 3 unspecified: Secondary | ICD-10-CM

## 2017-09-11 DIAGNOSIS — I129 Hypertensive chronic kidney disease with stage 1 through stage 4 chronic kidney disease, or unspecified chronic kidney disease: Secondary | ICD-10-CM

## 2017-09-11 NOTE — Telephone Encounter (Signed)
Copied from [place]. Topic: Quick Communication - See Telephone Encounter >> Sep 11, 2017 12:04 PM Robina Ade, Helene Kelp D wrote: CRM for notification. See Telephone encounter for: 09/11/17. Patient wife called and would like to talk to [facility] about patient's medications. She has questions about a couple of them. Please call her back, thanks.

## 2017-09-12 ENCOUNTER — Other Ambulatory Visit: Payer: Self-pay

## 2017-09-12 ENCOUNTER — Ambulatory Visit (INDEPENDENT_AMBULATORY_CARE_PROVIDER_SITE_OTHER): Payer: Medicare Other | Admitting: *Deleted

## 2017-09-12 DIAGNOSIS — I6529 Occlusion and stenosis of unspecified carotid artery: Secondary | ICD-10-CM

## 2017-09-12 DIAGNOSIS — I639 Cerebral infarction, unspecified: Secondary | ICD-10-CM | POA: Diagnosis not present

## 2017-09-12 NOTE — Telephone Encounter (Signed)
Spoke to [name] (wife).  She reports that nephrology is advising the pt to take furosemide 80mg  - 2 tabs once daily.  Also advised hydralazine 100 mg BID.  Pt has been on 50 mg tid in the past and did fairly well with that.  It did make his irritable and sleepy.  Wife has concerns for pushing up to 100 mg BID.  Would like to speak to [facility] directly if possible but okay with me calling back.  BP at Dr. Serita Grit office on 09/06/17 was 177/91.  Please advise.

## 2017-09-12 NOTE — Progress Notes (Signed)
[facility] Summary Report / Loop Recorder 

## 2017-09-13 ENCOUNTER — Encounter: Payer: Medicare Other | Admitting: Occupational Therapy

## 2017-09-13 ENCOUNTER — Encounter: Payer: Self-pay | Admitting: Occupational Therapy

## 2017-09-13 NOTE — Telephone Encounter (Signed)
If they are comfortable with it, we can refer to a different Nephrologist for a second opinion. I believe they are at [facility]?

## 2017-09-13 NOTE — Therapy (Signed)
[facility] [address] [place], [place], [address] Phone: [phone]   Fax:  [phone]  Patient Details  Name: [name] [name] MRN: [id number] Date of Birth: Sep 25, 1945 Referring Provider:  No ref. provider found  Encounter Date: 09/13/2017 OCCUPATIONAL THERAPY DISCHARGE SUMMARY    Current functional level related to goals / functional outcomes: Pt made progres towards short term goals. Long term goals were not assessed as pt called and cancelled all remaining visits.   Remaining deficits:cognitive deficits   Education / Equipment: Pt and wife were educated regarding activities to perform at home to help cognition.  Plan: Patient agrees to discharge.  Patient goals were not met. Patient is being discharged due to not returning since the last visit.  ?????      Messiah Ahr 09/13/2017, 12:50 PM  [facility] [address] [place], [place], [address] Phone: [phone]   Fax:  [phone]

## 2017-09-14 ENCOUNTER — Ambulatory Visit: Payer: Medicare Other | Admitting: Neurology

## 2017-09-14 ENCOUNTER — Encounter: Payer: Self-pay | Admitting: Neurology

## 2017-09-14 VITALS — BP 178/92 | HR 76 | Wt 222.8 lb

## 2017-09-14 DIAGNOSIS — I639 Cerebral infarction, unspecified: Secondary | ICD-10-CM

## 2017-09-14 MED ORDER — EVOLOCUMAB 140 MG/ML ~~LOC~~ SOSY: 140.0000 mg | PREFILLED_SYRINGE | SUBCUTANEOUS | 3 refills | Status: DC

## 2017-09-14 NOTE — Patient Instructions (Signed)
I had a long d/w patient about his recent stroke, risk for recurrent stroke/TIAs, personally independently reviewed imaging studies and stroke evaluation results and answered questions.Continue aspirin 325 mg daily  for secondary stroke prevention and maintain strict control of hypertension with blood pressure goal below 130/90, diabetes with hemoglobin A1c goal below 6.5% and lipids with LDL cholesterol goal below 70 mg/dL. I also advised the patient to eat a healthy diet with plenty of whole grains, cereals, fruits and vegetables, exercise regularly and maintain ideal body weight start Repatha ( new PCSK( inhibitor) injection 140 mg every 2 weeks for elevated lipids as patient has yearly shown intolerance to Lipitor and simvastatin in the past and last LDL cholesterol was significantly elevated at 212 mg percent.Followup in the future with my practitioner in 3 months or call earlier if necessary  Evolocumab injection What is this medicine? EVOLOCUMAB (e voe LOK ue mab) is known as a PCSK9 inhibitor. It is used to lower the level of cholesterol in the blood. It may be used alone or in combination with other cholesterol-lowering drugs. This drug may also be used to reduce the risk of heart attack, stroke, and certain types of heart surgery in patients with heart disease. This medicine may be used for other purposes; ask your health care provider or pharmacist if you have questions. COMMON BRAND NAME(S): REPATHA What should I tell my health care provider before I take this medicine? They need to know if you have any of these conditions: -an unusual or allergic reaction to evolocumab, other medicines, foods, dyes, or preservatives -pregnant or trying to get pregnant -breast-feeding How should I use this medicine? This medicine is for injection under the skin. You will be taught how to prepare and give this medicine. Use exactly as directed. Take your medicine at regular intervals. Do not take your  medicine more often than directed. It is important that you put your used needles and syringes in a special sharps container. Do not put them in a trash can. If you do not have a sharps container, call your pharmacist or health care provider to get one. Talk to your pediatrician regarding the use of this medicine in children. While this drug may be prescribed for children as young as 13 years for selected conditions, precautions do apply. Overdosage: If you think you have taken too much of this medicine contact a poison control center or emergency room at once. NOTE: This medicine is only for you. Do not share this medicine with others. What if I miss a dose? If you miss a dose, take it as soon as you can if there are more than 7 days until the next scheduled dose, or skip the missed dose and take the next dose according to your original schedule. Do not take double or extra doses. What may interact with this medicine? Interactions are not expected. This list may not describe all possible interactions. Give your health care provider a list of all the medicines, herbs, non-prescription drugs, or dietary supplements you use. Also tell them if you smoke, drink alcohol, or use illegal drugs. Some items may interact with your medicine. What should I watch for while using this medicine? You may need blood work while you are taking this medicine. What side effects may I notice from receiving this medicine? Side effects that you should report to your doctor or health care professional as soon as possible: -allergic reactions like skin rash, itching or hives, swelling of the face, lips, or  tongue -signs and symptoms of infection like fever or chills; cough; sore throat; pain or trouble passing urine Side effects that usually do not require medical attention (report to your doctor or health care professional if they continue or are bothersome): -diarrhea -nausea -muscle pain -pain, redness, or irritation  at site where injected This list may not describe all possible side effects. Call your doctor for medical advice about side effects. You may report side effects to FDA at 1-800-FDA-1088. Where should I keep my medicine? Keep out of the reach of children. You will be instructed on how to store this medicine. Throw away any unused medicine after the expiration date on the label. NOTE: This sheet is a summary. It may not cover all possible information. If you have questions about this medicine, talk to your doctor, pharmacist, or health care provider.  2018 Elsevier/Gold Standard (2016-06-06 13:21:53)

## 2017-09-14 NOTE — Telephone Encounter (Signed)
Spoke to [name].  She and pt are agreeable to getting a second opinion.  Will place order.

## 2017-09-14 NOTE — Progress Notes (Addendum)
[facility] [address] [place]. [place] [address] [phone]       OFFICE FOLLOW-UP NOTE  [name] Date of Birth:  07-26-1945 Medical Record Number:  [id number]   HPI:[name] [name] is a 72 year old pleasant African-American male seen today for the first office follow-up visit following hospital admission for stroke in January 2019. History is obtained from the patient, review of electronic medical records and I have personally reviewed imaging films.[name] [name] an 72 y.o.malemalewith medical history significant ofdiabetes mellitus type 2, CKD stage III, hypertension, hyperlipidemiapresented as stroke alert with aphasia. LSN was 2 am according to wife.  When he woke up at 8am,he was confused, speaking only few words. After few hours, EMS was alerted. BP was 016 systolic per EMS. On arrival patient had aphasia and mild right side weakness.Date last known well:1.5.19 Time last known well:2 am.tPA Given:no, outside window NIHSS:4 Baseline MRS0. CT) showed no significant head large vessel Tetanus stenosis but a lipid-laden plaque in the proximal left ICA. CT perfusion showed no significant cord infarct. MRI scan of the brain showed multifocal areas of infarcts involving left MCA territory left frontal cortex and subcortical region. Transthoracic echo showed normal ejection fraction without cardiac source of embolism. Extracranial Dopplers were unremarkable. Transesophageal echo showed no Source of embolism. Patient had loop recorder inserted follow-up paroxysmal atrial fibrillation and so far it has not yet been found. LDL cholesterol was elevated at 212 mg percent but patient has a history of intolerance to statins and has tried Lipitor and simvastatin the past. He is currently on Zetia. He states his done well since discharge is still having some occasional speech difficulties particularly when tired or tries to talk too fast. This is tolerating aspirin  without bleeding or bruising. His fasting sugars are  pretty good and ranges in the low 100-110 range. His blood pressure normally at home is in the 130 to 140 range but today it is elevated in office at 170/92.   14 system review of systems is positive for  Cough, daytime sleepiness, activity change, speech difficulty and all the systems negative PMH:  Past Medical History:  Diagnosis Date  . DIABETES MELLITUS, TYPE II 01/02/2007  . HYPERLIPIDEMIA 06/18/2007  . HYPERTENSION 01/02/2007  . RENAL INSUFFICIENCY 02/05/2008  . Stroke [facility])     Social History:  Social History   Socioeconomic History  . Marital status: Married    Spouse name: Not on file  . Number of children: Not on file  . Years of education: Not on file  . Highest education level: Not on file  Social Needs  . Financial resource strain: Not on file  . Food insecurity - worry: Not on file  . Food insecurity - inability: Not on file  . Transportation needs - medical: Not on file  . Transportation needs - non-medical: Not on file  Occupational History  . Not on file  Tobacco Use  . Smoking status: Never Smoker  . Smokeless tobacco: Never Used  Substance and Sexual Activity  . Alcohol use: No  . Drug use: No  . Sexual activity: Not on file  Other Topics Concern  . 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 Likes to go to football games.     Medications:  Current Outpatient Medications on File Prior to Visit  Medication Sig Dispense Refill  . aspirin 325 MG EC tablet Take 1 tablet (325 mg total) by mouth daily. 30 tablet 0  . benazepril (LOTENSIN) 40 MG tablet Take 1 tablet (40 mg total) by mouth 2 (two) times daily. 60 tablet 0  . calcitRIOL (ROCALTROL) 0.25 MCG capsule Take 1 capsule by mouth 3 (three) times daily.     Marland Kitchen ezetimibe (ZETIA) 10 MG tablet Take 1 tablet  (10 mg total) by mouth daily. 90 tablet 1  . furosemide (LASIX) 80 MG tablet TK 1 T PO QAM  3  . glucose blood (ONETOUCH VERIO) test strip 1 each by Other route 2 (two) times daily. Check blood sugars twice per day 180 each 6  . hydrALAZINE (APRESOLINE) 50 MG tablet Take 1 tablet (50 mg total) by mouth 3 (three) times daily. (Patient taking differently: Take 50 mg by mouth 2 (two) times daily. ) 90 tablet 0  . HYDROcodone-homatropine (HYCODAN) 5-1.5 MG/5ML syrup Take 5 mLs by mouth every 8 (eight) hours as needed for cough. 120 mL 0  . Insulin Glargine (LANTUS SOLOSTAR) 100 UNIT/ML Solostar Pen Inject 18 Units into the skin daily at 10 pm. INJECT 20 UNITS SUBCUTANEOUSLY ONCE DAILY (Patient taking differently: Inject 18 Units into the skin daily at 10 pm. ) 2 pen 6  . labetalol (NORMODYNE) 200 MG tablet Take 0.5 tablets (100 mg total) by mouth 2 (two) times daily. 120 tablet 0  . nateglinide (STARLIX) 60 MG tablet Take 1 tablet (60 mg total) by mouth 3 (three) times daily with meals. (Patient taking differently: Take 60 mg by mouth every evening. ) 90 tablet 0   No current facility-administered medications on file prior to visit.     Allergies:   Allergies  Allergen Reactions  . Bactrim [Sulfamethoxazole-Trimethoprim] Hives, Itching and Swelling  . Penicillins     Has patient had a PCN reaction causing immediate rash, facial/tongue/throat swelling, SOB or lightheadedness with hypotension: Unk Has patient had a PCN reaction causing severe rash involving mucus membranes or skin necrosis: Unk Has patient had a PCN reaction that required hospitalization: Unk Has patient had a PCN reaction occurring within the last 10 years: No If all of the above answers are "NO", then may proceed with Cephalosporin use.  . Statins     myalgia  . Sulfa Drugs Cross Reactors Hives, Itching and Swelling    Physical Exam General: well developed, well nourished elderly African-American male, seated, in no evident  distress Head: head normocephalic and atraumatic.  Neck: supple with no carotid or supraclavicular bruits Cardiovascular: regular rate and rhythm, no murmurs Musculoskeletal: no deformity Skin:  no rash/petichiae Vascular:  Normal pulses all extremities Vitals:   09/14/17 0940  BP: (!) 178/92  Pulse: 76   Neurologic Exam Mental Status: Awake and fully alert. Oriented to place and time. Recent and remote memory intact. Attention span, concentration and fund of knowledge appropriate. Mood and affect appropriate. r apraxia. Diminished animal naming 6 only. Able to name, repeat and comprehend quite well Cranial Nerves: Fundoscopic exam reveals sharp disc margins. Pupils equal, briskly reactive to light. Extraocular movements full without nystagmus. Visual fields full to confrontation. Hearing intact. Facial sensation intact. Face, tongue, palate moves normally and symmetrically.  Motor: Normal bulk and tone. Normal strength in all tested extremity muscles. Sensory.: intact to touch ,pinprick .position and vibratory sensation.  Coordination: Rapid alternating movements normal in all extremities. Finger-to-nose and heel-to-shin performed accurately  bilaterally. Gait and Station: Arises from chair without difficulty. Stance is normal. Gait demonstrates normal stride length and balance . Able to heel, toe and tandem walk without difficulty.  Reflexes: 1+ and symmetric. Toes downgoing.   NIHSS  0 Modified Rankin  1   ASSESSMENT: 84 year African-American male with embolic left MCA branch infarct in January 2019 of cryptogenic etiology. Vascular risk factors of hypertension and hyperlipidemia    PLAN: I had a long d/w patient about his recent stroke, risk for recurrent stroke/TIAs, personally independently reviewed imaging studies and stroke evaluation results and answered questions.Continue aspirin 325 mg daily  for secondary stroke prevention and maintain strict control of hypertension with  blood pressure goal below 130/90, diabetes with hemoglobin A1c goal below 6.5% and lipids with LDL cholesterol goal below 70 mg/dL. I also advised the patient to eat a healthy diet with plenty of whole grains, cereals, fruits and vegetables, exercise regularly and maintain ideal body weight he was unable to tolerate Lipitor and simvastatin in  the past and developed muscle aches and pains and discontinued it with improvement in his symptoms hencestart Repatha ( new PCSK( inhibitor) injection 140 mg every 2 weeks for elevated lipids as patient has yearly shown intolerance to Lipitor and simvastatin in the past and last LDL cholesterol was significantly elevated at 212 mg percent.Followup in the future with my practitioner in 3 months or call earlier if necessary Greater than 50% of time during this 25 minute visit was spent on counseling,explanation of diagnosis, planning of further management, discussion with patient and family and coordination of care Antony Contras, MD  [facility] [address] [place], [place] [address]  Phone [phone] Fax [phone] Note: This document was prepared with digital dictation and possible smart phrase technology. Any transcriptional errors that result from this process are unintentional

## 2017-09-15 ENCOUNTER — Telehealth: Payer: Self-pay

## 2017-09-15 NOTE — Telephone Encounter (Signed)
PA for repatha done on cover my meds. Approval or denial pending.

## 2017-09-18 NOTE — Telephone Encounter (Signed)
RN call [employer] about missing information for PA. The customer service rep stated a fax was sent Friday after 200pm. They did not get a form back,and the repatha was denied.Rn stated the office closes on [place] Sunday.The rep stated even though our office is closed they still have to count the weekends.Denial letter was fax to our office.

## 2017-09-19 ENCOUNTER — Ambulatory Visit (INDEPENDENT_AMBULATORY_CARE_PROVIDER_SITE_OTHER): Payer: Medicare Other | Admitting: Adult Health

## 2017-09-19 ENCOUNTER — Encounter: Payer: Self-pay | Admitting: Adult Health

## 2017-09-19 VITALS — BP 160/82 | Temp 98.2°F | Ht 73.0 in | Wt 218.0 lb

## 2017-09-19 DIAGNOSIS — E782 Mixed hyperlipidemia: Secondary | ICD-10-CM | POA: Diagnosis not present

## 2017-09-19 DIAGNOSIS — Z0000 Encounter for general adult medical examination without abnormal findings: Secondary | ICD-10-CM | POA: Diagnosis not present

## 2017-09-19 DIAGNOSIS — N183 Chronic kidney disease, stage 3 unspecified: Secondary | ICD-10-CM

## 2017-09-19 DIAGNOSIS — I10 Essential (primary) hypertension: Secondary | ICD-10-CM | POA: Diagnosis not present

## 2017-09-19 DIAGNOSIS — Z125 Encounter for screening for malignant neoplasm of prostate: Secondary | ICD-10-CM

## 2017-09-19 DIAGNOSIS — Z794 Long term (current) use of insulin: Secondary | ICD-10-CM

## 2017-09-19 DIAGNOSIS — E1122 Type 2 diabetes mellitus with diabetic chronic kidney disease: Secondary | ICD-10-CM

## 2017-09-19 LAB — CBC WITH DIFFERENTIAL/PLATELET
BASOS ABS: 0 10*3/uL (ref 0.0–0.1)
Basophils Relative: 0.7 % (ref 0.0–3.0)
EOS PCT: 6.8 % — AB (ref 0.0–5.0)
Eosinophils Absolute: 0.4 10*3/uL (ref 0.0–0.7)
HEMATOCRIT: 34.8 % — AB (ref 39.0–52.0)
Hemoglobin: 11.7 g/dL — ABNORMAL LOW (ref 13.0–17.0)
LYMPHS ABS: 1.5 10*3/uL (ref 0.7–4.0)
LYMPHS PCT: 25.3 % (ref 12.0–46.0)
MCHC: 33.5 g/dL (ref 30.0–36.0)
MCV: 81.6 fl (ref 78.0–100.0)
MONOS PCT: 5.5 % (ref 3.0–12.0)
Monocytes Absolute: 0.3 10*3/uL (ref 0.1–1.0)
NEUTROS PCT: 61.7 % (ref 43.0–77.0)
Neutro Abs: 3.5 10*3/uL (ref 1.4–7.7)
Platelets: 254 10*3/uL (ref 150.0–400.0)
RBC: 4.26 Mil/uL (ref 4.22–5.81)
RDW: 15.6 % — ABNORMAL HIGH (ref 11.5–15.5)
WBC: 5.7 10*3/uL (ref 4.0–10.5)

## 2017-09-19 LAB — PSA: PSA: 0.74 ng/mL (ref 0.10–4.00)

## 2017-09-19 LAB — BASIC METABOLIC PANEL
BUN: 35 mg/dL — ABNORMAL HIGH (ref 6–23)
CALCIUM: 9.3 mg/dL (ref 8.4–10.5)
CO2: 27 meq/L (ref 19–32)
CREATININE: 2.58 mg/dL — AB (ref 0.40–1.50)
Chloride: 105 mEq/L (ref 96–112)
GFR: 31.75 mL/min — ABNORMAL LOW (ref >60.00)
Glucose, Bld: 160 mg/dL — ABNORMAL HIGH (ref 70–99)
Potassium: 4.4 mEq/L (ref 3.5–5.1)
Sodium: 139 mEq/L (ref 135–145)

## 2017-09-19 LAB — HEPATIC FUNCTION PANEL
ALBUMIN: 4.1 g/dL (ref 3.5–5.2)
ALK PHOS: 61 U/L (ref 39–117)
ALT: 10 U/L (ref 0–53)
AST: 11 U/L (ref 0–37)
BILIRUBIN DIRECT: 0.1 mg/dL (ref 0.0–0.3)
TOTAL PROTEIN: 6.6 g/dL (ref 6.0–8.3)
Total Bilirubin: 0.5 mg/dL (ref 0.2–1.2)

## 2017-09-19 LAB — VITAMIN D 25 HYDROXY (VIT D DEFICIENCY, FRACTURES): VITD: 12.19 ng/mL — ABNORMAL LOW (ref 30.00–100.00)

## 2017-09-19 LAB — TSH: TSH: 0.93 u[IU]/mL (ref 0.35–4.50)

## 2017-09-19 LAB — HEMOGLOBIN A1C: Hgb A1c MFr Bld: 6.5 % (ref 4.6–6.5)

## 2017-09-19 NOTE — Progress Notes (Signed)
Subjective:    Patient ID: [name] [name], male    DOB: [birth year], 72 y.o.   MRN: [id number]  HPI  Patient presents for yearly preventative medicine examination. He is a pleasant 72 year old male who  has a past medical history of DIABETES MELLITUS, TYPE II (01/02/2007), HYPERLIPIDEMIA (06/18/2007), HYPERTENSION (01/02/2007), RENAL INSUFFICIENCY (02/05/2008), and Stroke ([place]).  He has a history of diabetes mellitus for which he is using Starlix 60 mg nightly and Lantus 18 units nightly.  Is monitoring his blood sugars at home and reports readings consistently below 150 Lab Results  Component Value Date   HGBA1C 8.1 (H) 07/09/2017   Is uncontrolled hypertension for which he sees nephrology currently taking Lotensin 40 mg, hydralazine 50 mg TID, and labetaolol 100 mg twice daily. BP goal 130/80. Per wife, since going back to hydralazine 50 mg his BP has been 409-811'B systolic.  BP Readings from Last 3 Encounters:  09/19/17 (!) 160/82  09/14/17 (!) 178/92  09/06/17 (!) 177/91   That is post stroke in January 2019.  Currently seeing neurology.  Most recently was seen last week and was prescribed Repatha d/t intolerance to statins.  He continues on Zetia and aspirin 324 mg for stroke prevention.  Lab Results  Component Value Date   CHOL 279 (H) 07/09/2017   HDL 36 (L) 07/09/2017   LDLCALC 212 (H) 07/09/2017   LDLDIRECT 180.7 06/24/2013   TRIG 156 (H) 07/09/2017   CHOLHDL 7.8 07/09/2017    All immunizations and health maintenance protocols were reviewed with the patient and needed orders were placed.  Appropriate screening laboratory values were ordered for the patient including screening of hyperlipidemia, renal function and hepatic function. If indicated by BPH, a PSA was ordered.  Medication reconciliation,  past medical history, social history, problem list and allergies were reviewed in detail with the patient  Goals were established with regard to weight loss, exercise, and  diet  in compliance with medications  End of life planning was discussed.  He has an advanced directive and living well  He is due for routine colonoscopy.  He reports getting routine dental and vision screens.  Review of Systems  Constitutional: Negative.   HENT: Negative.   Eyes: Negative.   Respiratory: Negative.   Cardiovascular: Negative.   Gastrointestinal: Negative.   Endocrine: Negative.   Genitourinary: Negative.   Musculoskeletal: Negative.   Skin: Negative.   Allergic/Immunologic: Negative.   Neurological: Negative.   Hematological: Negative.   Psychiatric/Behavioral: Negative.   All other systems reviewed and are negative.  Past Medical History:  Diagnosis Date  . DIABETES MELLITUS, TYPE II 01/02/2007  . HYPERLIPIDEMIA 06/18/2007  . HYPERTENSION 01/02/2007  . RENAL INSUFFICIENCY 02/05/2008  . Stroke [facility])     Social History   Socioeconomic History  . Marital status: Married    Spouse name: Not on file  . Number of children: Not on file  . Years of education: Not on file  . Highest education level: Not on file  Social Needs  . Financial resource strain: Not on file  . Food insecurity - worry: Not on file  . Food insecurity - inability: Not on file  . Transportation needs - medical: Not on file  . Transportation needs - non-medical: Not on file  Occupational History  . Not on file  Tobacco Use  . Smoking status: Never Smoker  . Smokeless tobacco: Never Used  Substance and Sexual Activity  . Alcohol use: No  . Drug use:  No  . Sexual activity: Not on file  Other Topics Concern  . 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 Likes to go to football games.     Past Surgical History:  Procedure Laterality Date  . LOOP RECORDER INSERTION N/A 07/11/2017   Procedure: LOOP RECORDER INSERTION;  Surgeon: Thompson Grayer,  MD;  Location: [place] [facility];  Service: Cardiovascular;  Laterality: N/A;  . SHOULDER SURGERY     left  . TEE WITHOUT CARDIOVERSION N/A 07/11/2017   Procedure: TRANSESOPHAGEAL ECHOCARDIOGRAM (TEE);  Surgeon: Dorothy Spark, MD;  Location: [facility] ENDOSCOPY;  Service: Cardiovascular;  Laterality: N/A;  . TRANSURETHRAL RESECTION OF PROSTATE     history of retention/hematuria     Family History  Problem Relation Age of Onset  . Hypertension Mother   . Diabetes Mother   . Stroke Father   . Stroke Maternal Grandmother     Allergies  Allergen Reactions  . Bactrim [Sulfamethoxazole-Trimethoprim] Hives, Itching and Swelling  . Penicillins     Has patient had a PCN reaction causing immediate rash, facial/tongue/throat swelling, SOB or lightheadedness with hypotension: Unk Has patient had a PCN reaction causing severe rash involving mucus membranes or skin necrosis: Unk Has patient had a PCN reaction that required hospitalization: Unk Has patient had a PCN reaction occurring within the last 10 years: No If all of the above answers are "NO", then may proceed with Cephalosporin use.  . Statins     myalgia  . Sulfa Drugs Cross Reactors Hives, Itching and Swelling    Current Outpatient Medications on File Prior to Visit  Medication Sig Dispense Refill  . aspirin 325 MG EC tablet Take 1 tablet (325 mg total) by mouth daily. 30 tablet 0  . benazepril (LOTENSIN) 40 MG tablet Take 1 tablet (40 mg total) by mouth 2 (two) times daily. 60 tablet 0  . calcitRIOL (ROCALTROL) 0.25 MCG capsule Take 1 capsule by mouth 3 (three) times daily.     Marland Kitchen ezetimibe (ZETIA) 10 MG tablet Take 1 tablet (10 mg total) by mouth daily. 90 tablet 1  . glucose blood (ONETOUCH VERIO) test strip 1 each by Other route 2 (two) times daily. Check blood sugars twice per day 180 each 6  . hydrALAZINE (APRESOLINE) 50 MG tablet Take 1 tablet (50 mg total) by mouth 3 (three) times daily. (Patient taking differently: Take 50 mg by  mouth 2 (two) times daily. ) 90 tablet 0  . Insulin Glargine (LANTUS SOLOSTAR) 100 UNIT/ML Solostar Pen Inject 18 Units into the skin daily at 10 pm. INJECT 20 UNITS SUBCUTANEOUSLY ONCE DAILY (Patient taking differently: Inject 18 Units into the skin daily at 10 pm. ) 2 pen 6  . labetalol (NORMODYNE) 200 MG tablet Take 0.5 tablets (100 mg total) by mouth 2 (two) times daily. 120 tablet 0  . nateglinide (STARLIX) 60 MG tablet Take 1 tablet (60 mg total) by mouth 3 (three) times daily with meals. (Patient taking differently: Take 60 mg by mouth every evening. ) 90 tablet 0  . Evolocumab (REPATHA) 140 MG/ML SOSY Inject 140 mg into the skin every 14 (fourteen) days. (Patient not taking: Reported on 09/19/2017) 2 Syringe 3  . HYDROcodone-homatropine (HYCODAN) 5-1.5 MG/5ML syrup Take 5 mLs by mouth every 8 (eight) hours as  needed for cough. (Patient not taking: Reported on 09/19/2017) 120 mL 0   No current facility-administered medications on file prior to visit.     BP (!) 160/82 (BP Location: Left Arm, Patient Position: Sitting, Cuff Size: Large)   Temp 98.2 F (36.8 C) (Oral)   Ht 6\' 1"  (1.854 m)   Wt 218 lb (98.9 kg)   BMI 28.76 kg/m       Objective:   Physical Exam  Constitutional: He is oriented to person, place, and time. He appears well-developed and well-nourished. No distress.  HENT:  Head: Normocephalic and atraumatic.  Right Ear: External ear normal.  Left Ear: External ear normal.  Nose: Nose normal.  Mouth/Throat: Oropharynx is clear and moist. No oropharyngeal exudate.  Eyes: Conjunctivae are normal. Pupils are equal, round, and reactive to light. Right eye exhibits no discharge. Left eye exhibits no discharge. No scleral icterus.  Neck: Normal range of motion. Neck supple. No JVD present. Carotid bruit is not present. No tracheal deviation present. No thyromegaly present.  Cardiovascular: Normal rate, regular rhythm, normal heart sounds and intact distal pulses. Exam reveals no  gallop and no friction rub.  No murmur heard. Pulmonary/Chest: Effort normal and breath sounds normal. No stridor. No respiratory distress. He has no wheezes. He has no rales. He exhibits no tenderness.  Abdominal: Soft. Bowel sounds are normal. He exhibits no distension and no mass. There is no tenderness. There is no rebound and no guarding.  Musculoskeletal: Normal range of motion. He exhibits no edema, tenderness or deformity.  Lymphadenopathy:    He has no cervical adenopathy.  Neurological: He is alert and oriented to person, place, and time. He has normal reflexes. He displays normal reflexes. No cranial nerve deficit. He exhibits normal muscle tone. Coordination normal.  Skin: Skin is warm and dry. No rash noted. He is not diaphoretic. No erythema. No pallor.  Psychiatric: He has a normal mood and affect. His behavior is normal. Judgment and thought content normal.  Nursing note and vitals reviewed.     Assessment & Plan:  1. Routine general medical examination at a health care facility - Will order cologuard  - Needs to get out and start exercising.  - Follow up in one year or sooner if neeed - Basic metabolic panel - CBC with Differential/Platelet - Hemoglobin A1c - Hepatic function panel - TSH - Vitamin D, 1,25-dihydroxy  2. Type 2 diabetes mellitus with stage 3 chronic kidney disease, with long-term current use of insulin (HCC) -  Consider increase in Starlix - Follow up in 3 months  - Basic metabolic panel - CBC with Differential/Platelet - Hemoglobin A1c - Hepatic function panel - TSH  3. Mixed hyperlipidemia - Ok to start Repatha  - Basic metabolic panel - CBC with Differential/Platelet - Hemoglobin A1c - Hepatic function panel - TSH  4. Uncontrolled hypertension - Becoming better controlled at home.  Continue with current plan  - Basic metabolic panel - CBC with Differential/Platelet - Hemoglobin A1c - Hepatic function panel - TSH  5. Stage 3 chronic  kidney disease ([place]) - Continue with current plan  - Basic metabolic panel - CBC with Differential/Platelet - Hemoglobin A1c - Hepatic function panel - TSH  6. Prostate cancer screening  - PSA  Dorothyann Peng, NP

## 2017-09-19 NOTE — Addendum Note (Signed)
Addended by: Honor Loh L on: 09/19/2017 10:00 AM   Modules accepted: Orders

## 2017-09-20 ENCOUNTER — Encounter: Payer: Medicare Other | Admitting: Occupational Therapy

## 2017-09-20 ENCOUNTER — Encounter: Payer: Medicare Other | Admitting: Speech Pathology

## 2017-09-21 NOTE — Telephone Encounter (Signed)
Pts wife called wanting to know if we were going to appeal the denial for Repatha. Please call to advise

## 2017-09-21 NOTE — Telephone Encounter (Signed)
Rn spoke with patients wife on dpr about repatha getting denied by his insurance. Rn stated the appeal will be done. Rn stated Dr. Leonie Man has to sign the appeal letter. Rn stated all office notes,lab work will be fax to the appeal department. Rn stated an appeal approval or denial can take 2 to 4 weeks. RN stated to wife that she will be given a call of the approval or denial. The wife verbalized understanding.

## 2017-09-21 NOTE — Telephone Encounter (Signed)
Standard appeal fax to [employer] Appeals and Grievance department at [phone]. Appeal letter, office notes,labs fax twice and confirmed.

## 2017-09-22 ENCOUNTER — Encounter: Payer: Medicare Other | Admitting: Occupational Therapy

## 2017-09-22 ENCOUNTER — Encounter: Payer: Medicare Other | Admitting: Speech Pathology

## 2017-09-24 ENCOUNTER — Other Ambulatory Visit: Payer: Self-pay | Admitting: Adult Health

## 2017-09-24 DIAGNOSIS — I129 Hypertensive chronic kidney disease with stage 1 through stage 4 chronic kidney disease, or unspecified chronic kidney disease: Secondary | ICD-10-CM

## 2017-09-25 NOTE — Telephone Encounter (Signed)
[name] [name] from Clinical appeal dept has called re: pt's Repatha.  Noralee Space can be reached at [phone] xt [phone]

## 2017-09-25 NOTE — Telephone Encounter (Signed)
Rn fax appeal letter,office notes, lab work to the expedite appeal number at  [phone] twice. The fax was receive confirmed x 2.

## 2017-09-25 NOTE — Telephone Encounter (Signed)
Rn tried Engineer, site at [phone]. The number is a nonvalid number for repatha. The RN is unable to call.

## 2017-09-26 NOTE — Telephone Encounter (Signed)
Left detail message on Noralee Space number that is [phone]  [phone] ext [phone]. A fax was receive needing more information. Office notes/appeal letter, lab work was fax to the urgent,and standard appeal number this week,and last week.

## 2017-09-26 NOTE — Telephone Encounter (Signed)
[name] [name]/[employer] [phone] called to check status of appeal. She was advised RN is waiting for a call from [facility]. She verbalized understanding and was appreciative

## 2017-09-26 NOTE — Telephone Encounter (Signed)
Rn call emma back spoke to different rep. They wanted to know the update of the appeal. Rn stated it was pending. [employer] stated they will reach out to the appeal department in 7 days.

## 2017-09-26 NOTE — Telephone Encounter (Signed)
Sent to the pharmacy by e-scribe. 

## 2017-09-27 ENCOUNTER — Encounter: Payer: Medicare Other | Admitting: Occupational Therapy

## 2017-09-29 ENCOUNTER — Encounter: Payer: Medicare Other | Admitting: Occupational Therapy

## 2017-10-02 ENCOUNTER — Ambulatory Visit: Payer: Self-pay

## 2017-10-02 NOTE — Telephone Encounter (Signed)
Patient's wife called stating Rx for Evolocumab (REPATHA) 140 MG/ML SOSY is at [facility] but is too expensive. She said she was told there is telephone number to call to get medication cheaper.

## 2017-10-02 NOTE — Telephone Encounter (Signed)
Pt.'s wife reports pt.'s blood glucose has been running low, especially over the weekend. This morning it was 54 - yesterday morning it was 63. Also his blood pressure this morning was 178/93. Asymptomatic presently. Appointment tomorrow with Mr. Carlisle Cater. Will call back with any further problems.  Answer Assessment - Initial Assessment Questions 1. SYMPTOMS: "What symptoms are you concerned about?"     Low blood glucose 2. ONSET:  "When did the symptoms start?"     This weekend 3. BLOOD GLUCOSE: "What is your blood glucose level?"      54 this morning 4. USUAL RANGE: "What is your blood glucose level usually?" (e.g., usual fasting morning value, usual evening value)     Usually higher 5. TYPE 1 or 2:  "Do you know what type of diabetes you have?"  (e.g., Type 1, Type 2, Gestational; doesn't know)      Type 1 6. INSULIN: "Do you take insulin?"       Yes 7. DIABETES PILLS: "Do you take any pills for your diabetes?"     Yes 8. OTHER SYMPTOMS: "Do you have any symptoms?" (e.g., fever, frequent urination, difficulty breathing, vomiting)     No 9. LOW BLOOD GLUCOSE TREATMENT: "What have you done so far to treat the low blood glucose level?"     Just eating 10. ALONE: "Are you alone right now or is someone with you?"         Wife is with pt. 11. PREGNANCY: "Is there any chance you are pregnant?" "When was your last menstrual period?"       No  Protocols used: DIABETES - LOW BLOOD SUGAR-A-AH

## 2017-10-02 NOTE — Telephone Encounter (Signed)
Rn spoke with pts wife

## 2017-10-03 ENCOUNTER — Encounter: Payer: Self-pay | Admitting: Adult Health

## 2017-10-03 ENCOUNTER — Ambulatory Visit: Payer: Medicare Other | Admitting: Adult Health

## 2017-10-03 DIAGNOSIS — I129 Hypertensive chronic kidney disease with stage 1 through stage 4 chronic kidney disease, or unspecified chronic kidney disease: Secondary | ICD-10-CM

## 2017-10-03 MED ORDER — HYDRALAZINE HCL 50 MG PO TABS: 50.0000 mg | ORAL_TABLET | Freq: Three times a day (TID) | ORAL | 0 refills | Status: DC

## 2017-10-03 NOTE — Progress Notes (Signed)
Subjective:    Patient ID: [name] [name], male    DOB: 08/27/1945, 72 y.o.   MRN: [id number]  HPI 72 year old male who  has a past medical history of DIABETES MELLITUS, TYPE II (01/02/2007), HYPERLIPIDEMIA (06/18/2007), HYPERTENSION (01/02/2007), RENAL INSUFFICIENCY (02/05/2008), and Stroke ([place]). His wife is with him at this visit.   He presents to the office today for a chronic issue of hypertension. His blood pressure has been running high at home. His wife is getting readings in the 180's/90's in the evening. This has been a trend for the last 7-10 days.   He also reports that he has not been able to get a refill of Hydralazine from his Nephrologist, he will be out tonight.  He is not happy with the care he has been receiving and will no longer go there. He has been referred to another nephrologist for second option. He is wondering if I can send in his script until he is seen.   He denies any blurred vision or headaches.   He has been walking more and is up to 30 minutes at a time.    Review of Systems See HPI   Past Medical History:  Diagnosis Date  . DIABETES MELLITUS, TYPE II 01/02/2007  . HYPERLIPIDEMIA 06/18/2007  . HYPERTENSION 01/02/2007  . RENAL INSUFFICIENCY 02/05/2008  . Stroke [facility])     Social History   Socioeconomic History  . Marital status: Married    Spouse name: Not on file  . Number of children: Not on file  . Years of education: Not on file  . Highest education level: Not on file  Occupational History  . Not on file  Social Needs  . Financial resource strain: Not on file  . Food insecurity:    Worry: Not on file    Inability: Not on file  . Transportation needs:    Medical: Not on file    Non-medical: Not on file  Tobacco Use  . Smoking status: Never Smoker  . Smokeless tobacco: Never Used  Substance and Sexual Activity  . Alcohol use: No  . Drug use: No  . Sexual activity: Not on file  Lifestyle  . Physical activity:    Days per week: Not on  file    Minutes per session: Not on file  . Stress: Not on file  Relationships  . Social connections:    Talks on phone: Not on file    Gets together: Not on file    Attends religious service: Not on file    Active member of club or organization: Not on file    Attends meetings of clubs or organizations: Not on file    Relationship status: Not on file  . Intimate partner violence:    Fear of current or ex partner: Not on file    Emotionally abused: Not on file    Physically abused: Not on file    Forced sexual activity: Not on file  Other Topics Concern  . 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 Likes to go to football games.     Past Surgical History:  Procedure Laterality Date  . LOOP RECORDER INSERTION N/A 07/11/2017   Procedure: LOOP RECORDER INSERTION;  Surgeon: Thompson Grayer, MD;  Location: [place]  [facility];  Service: Cardiovascular;  Laterality: N/A;  . SHOULDER SURGERY     left  . TEE WITHOUT CARDIOVERSION N/A 07/11/2017   Procedure: TRANSESOPHAGEAL ECHOCARDIOGRAM (TEE);  Surgeon: Dorothy Spark, MD;  Location: [facility] ENDOSCOPY;  Service: Cardiovascular;  Laterality: N/A;  . TRANSURETHRAL RESECTION OF PROSTATE     history of retention/hematuria     Family History  Problem Relation Age of Onset  . Hypertension Mother   . Diabetes Mother   . Stroke Father   . Stroke Maternal Grandmother     Allergies  Allergen Reactions  . Bactrim [Sulfamethoxazole-Trimethoprim] Hives, Itching and Swelling  . Penicillins     Has patient had a PCN reaction causing immediate rash, facial/tongue/throat swelling, SOB or lightheadedness with hypotension: Unk Has patient had a PCN reaction causing severe rash involving mucus membranes or skin necrosis: Unk Has patient had a PCN reaction that required hospitalization: Unk Has patient  had a PCN reaction occurring within the last 10 years: No If all of the above answers are "NO", then may proceed with Cephalosporin use.  . Statins     myalgia  . Sulfa Drugs Cross Reactors Hives, Itching and Swelling    Current Outpatient Medications on File Prior to Visit  Medication Sig Dispense Refill  . aspirin 325 MG EC tablet Take 1 tablet (325 mg total) by mouth daily. 30 tablet 0  . benazepril (LOTENSIN) 40 MG tablet TAKE 1 TABLET(40 MG) BY MOUTH TWICE DAILY 60 tablet 11  . calcitRIOL (ROCALTROL) 0.25 MCG capsule Take 1 capsule by mouth 3 (three) times daily.     . Evolocumab (REPATHA) 140 MG/ML SOSY Inject 140 mg into the skin every 14 (fourteen) days. 2 Syringe 3  . ezetimibe (ZETIA) 10 MG tablet Take 1 tablet (10 mg total) by mouth daily. 90 tablet 1  . glucose blood (ONETOUCH VERIO) test strip 1 each by Other route 2 (two) times daily. Check blood sugars twice per day 180 each 6  . hydrALAZINE (APRESOLINE) 50 MG tablet Take 1 tablet (50 mg total) by mouth 3 (three) times daily. (Patient taking differently: Take 50 mg by mouth 2 (two) times daily. ) 90 tablet 0  . Insulin Glargine (LANTUS SOLOSTAR) 100 UNIT/ML Solostar Pen Inject 18 Units into the skin daily at 10 pm. INJECT 20 UNITS SUBCUTANEOUSLY ONCE DAILY (Patient taking differently: Inject 18 Units into the skin daily at 10 pm. ) 2 pen 6  . labetalol (NORMODYNE) 200 MG tablet Take 0.5 tablets (100 mg total) by mouth 2 (two) times daily. 120 tablet 0  . nateglinide (STARLIX) 60 MG tablet Take 1 tablet (60 mg total) by mouth 3 (three) times daily with meals. (Patient taking differently: Take 60 mg by mouth every evening. ) 90 tablet 0   No current facility-administered medications on file prior to visit.     BP (!) 180/104 (BP Location: Left Arm)   Temp 98.5 F (36.9 C) (Oral)   Wt 219 lb (99.3 kg)   BMI 28.89 kg/m       Objective:   Physical Exam  Constitutional: He is oriented to person, place, and time. He appears  well-developed and well-nourished. No distress.  Cardiovascular: Normal rate, regular rhythm, normal heart sounds and intact distal pulses. Exam reveals no gallop and no friction rub.  No murmur heard. Pulmonary/Chest: Effort normal and breath sounds normal. No respiratory distress. He has no wheezes. He has no rales. He exhibits no tenderness.  Neurological: He is alert and oriented  to person, place, and time.  Skin: Skin is warm and dry. No rash noted. He is not diaphoretic. No erythema. No pallor.  Psychiatric: He has a normal mood and affect. His behavior is normal. Judgment and thought content normal.  Nursing note and vitals reviewed.      Assessment & Plan:  1. Hypertensive kidney disease - Will have them increase Labetalol from BID to TID. Send me BP readings in 3-5 days - hydrALAZINE (APRESOLINE) 50 MG tablet; Take 1 tablet (50 mg total) by mouth 3 (three) times daily.  Dispense: 90 tablet; Refill: 0   Dorothyann Peng, NP

## 2017-10-05 NOTE — Telephone Encounter (Signed)
Rn call [name] [name] patients wife. Rn stated the appeal was approve till 03/30/2018.  Rn stated the co payment is 256.74 per month for the injection for cholesterol. Rn stated per the pharmacy states pt has a deductible and they are not sure how much med will be the following month. The wife will call 1844 repatha for patients assistance. Also she will call the insurance company too. The wife verbalized understanding.

## 2017-10-05 NOTE — Telephone Encounter (Signed)
Appeal approve for Repatha. Authorization number [id number] Valid September 15, 2017 to March 30, 2018. Letter sent to medical records.

## 2017-10-06 NOTE — Telephone Encounter (Signed)
Rn spoke with wife she will be calling patient assistance with Repatha due to high co payment. The wife has the number. She will call today.

## 2017-10-09 ENCOUNTER — Telehealth: Payer: Self-pay | Admitting: Adult Health

## 2017-10-09 NOTE — Telephone Encounter (Signed)
Copied from [place] [phone]. Topic: General - Other >> Oct 09, 2017  2:24 PM Yvette Rack wrote: Reason for CRM: patient wife [name] calling with BP readings for [place]  April 2nd 164/85 evening  April 3rd morning 169/92 evening 154/82 April 4th morning 135/80 evening 136/74 April 5th morning 151/84 evening 177/97 April 6th morning 127/79 evening 151/79 April 7th morning 142/77 evening 159/82 April 8th morning 165/89

## 2017-10-10 NOTE — Telephone Encounter (Signed)
Patient's wife calling to advise patient did not qualify for patient assistance with [place] for [place] because of income but she has contacted a Medicare program patient assistance that hopefully patient will qualify for. She said patient should receive application for this in about 2 weeks. Even without patient assistance she states patient will get first month's supply of Repatha. A returned call is not needed unless there are questions.

## 2017-10-10 NOTE — Telephone Encounter (Signed)
Okay; thanks.

## 2017-10-10 NOTE — Telephone Encounter (Signed)
Are they ok with increase Labetalol from 1/2 tab TID to 1 tab TID

## 2017-10-10 NOTE — Telephone Encounter (Signed)
Left a message for a return call.

## 2017-10-11 NOTE — Telephone Encounter (Signed)
Spoke to [name] and instructed her to increase labetalol to take 1 whole tab tid.  She also mentioned that bg levels in the mornings are low.  Past 3 days they have been 59, 62 and 67.  He is taking Starlix once daily and 18 units of Lantus.  Please advise.  Thanks!!

## 2017-10-11 NOTE — Telephone Encounter (Signed)
Drop Lantus to 15 units

## 2017-10-11 NOTE — Telephone Encounter (Signed)
Pt's wife notified.  Would like to know if ok to start [place].  Please advise.

## 2017-10-11 NOTE — Telephone Encounter (Signed)
Ok to start [place]

## 2017-10-12 NOTE — Telephone Encounter (Signed)
Spoke to [name] and advised to start repatha.

## 2017-10-12 NOTE — Telephone Encounter (Signed)
Left a message for a return call.

## 2017-10-16 ENCOUNTER — Ambulatory Visit (INDEPENDENT_AMBULATORY_CARE_PROVIDER_SITE_OTHER): Payer: Medicare Other | Admitting: *Deleted

## 2017-10-16 DIAGNOSIS — I639 Cerebral infarction, unspecified: Secondary | ICD-10-CM | POA: Diagnosis not present

## 2017-10-16 NOTE — Progress Notes (Signed)
[facility] Summary Report / Loop Recorder 

## 2017-10-18 ENCOUNTER — Telehealth: Payer: Self-pay | Admitting: Adult Health

## 2017-10-18 MED ORDER — LABETALOL HCL 200 MG PO TABS: 200.0000 mg | ORAL_TABLET | Freq: Three times a day (TID) | ORAL | 2 refills | Status: DC

## 2017-10-18 NOTE — Telephone Encounter (Signed)
Copied from [place] [id number]. Topic: Quick Communication - See Telephone Encounter >> Oct 18, 2017 11:18 AM Rutherford Nail, NT wrote:  Patient's wife, [name] [name], calling to leave a message for [name] or [name]. States that a new prescription for Lobetalol needs to be written. States the patient was previously taking half a pill 2x a day. States [name] changed the amount to 1 tablet 3x a day and that the quantity of the pills needs to match what was prescribed for the new prescription. Would like it sent to [id number] - , [address] AT [address]. Please advise.

## 2017-10-18 NOTE — Telephone Encounter (Signed)
Sent to the pharmacy by e-scribe. 

## 2017-10-23 LAB — CUP PACEART REMOTE DEVICE CHECK
Implantable Pulse Generator Implant Date: 20190108
MDC IDC SESS DTM: 20190312160912

## 2017-10-31 ENCOUNTER — Other Ambulatory Visit: Payer: Self-pay | Admitting: Family Medicine

## 2017-10-31 ENCOUNTER — Telehealth: Payer: Self-pay | Admitting: Adult Health

## 2017-10-31 ENCOUNTER — Telehealth: Payer: Self-pay | Admitting: Neurology

## 2017-10-31 DIAGNOSIS — I129 Hypertensive chronic kidney disease with stage 1 through stage 4 chronic kidney disease, or unspecified chronic kidney disease: Secondary | ICD-10-CM

## 2017-10-31 LAB — COLOGUARD: COLOGUARD: NEGATIVE

## 2017-10-31 MED ORDER — HYDRALAZINE HCL 50 MG PO TABS: 50.0000 mg | ORAL_TABLET | Freq: Three times a day (TID) | ORAL | 2 refills | Status: DC

## 2017-10-31 NOTE — Telephone Encounter (Signed)
Patient's wife calling stating Evolocumab (REPATHA) 140 MG/ML SOSY is making patient have back discomfort, muscle spasms, twitching and is more fatigued. Please call and discuss. Patient uses [facility] on the corner of [address].

## 2017-10-31 NOTE — Telephone Encounter (Signed)
RN call patients wife about her husband having side effects from the repatha injection. The wife could not give me a clear time frame on when all the side effects happen.She stated it was building up to the back discomfort, muscle spasms, and twitching, and more fatigue.RN stated a message will be sent to Dr. Leonie Man.also advised pts wife to call repatha the nurse line to report this. The wife verbalized understanding.

## 2017-10-31 NOTE — Telephone Encounter (Signed)
Copied from [place] [id number]. Topic: Quick Communication - See Telephone Encounter >> Oct 31, 2017  9:01 AM [place], Laban Emperor wrote: CRM for notification. See Telephone encounter for: 10/31/17.  Pt wife [name]  states she has some questions about the pt Evolocumab ([place]) 140 MG/ML SOSY [[id number]]. She is aware that [name] did not order this medication for the pt but states she still would like to talk to him or his nurse.  Please f/u.

## 2017-10-31 NOTE — Telephone Encounter (Signed)
Spoke to [name].  She informed me that since starting Repatha, pt complains of back pain.  She reports he is constantly holding his back.  Especially while ambulating.  She also reports a bruise on patient's lower back.  He is having spasms in his legs.  Instructed [name] to call Dr. Clydene Fake office to report any side effects of medication so Dr. Leonie Man will be aware and can direct them on what to do next.  Pt also needed a refill of hydralazine.  I sent that to the pharmacy by e-scribe.

## 2017-11-01 NOTE — Telephone Encounter (Signed)
I returned the patient's wife's call.regarding concern about possible side effects from [place]. They had not yet called the report for line nurse. I recommended do so. She was asked to call us back if she needed further help

## 2017-11-08 ENCOUNTER — Encounter: Payer: Self-pay | Admitting: Adult Health

## 2017-11-08 ENCOUNTER — Ambulatory Visit: Payer: Medicare Other | Admitting: Adult Health

## 2017-11-08 VITALS — BP 100/60 | Temp 98.9°F | Wt 217.0 lb

## 2017-11-08 DIAGNOSIS — T50905A Adverse effect of unspecified drugs, medicaments and biological substances, initial encounter: Secondary | ICD-10-CM

## 2017-11-08 NOTE — Progress Notes (Signed)
Subjective:    Patient ID: [name] [name], male    DOB: Dec 06, 1945, 73 y.o.   MRN: [id number]  HPI  72 year old male who  has a past medical history of DIABETES MELLITUS, TYPE II (01/02/2007), HYPERLIPIDEMIA (06/18/2007), HYPERTENSION (01/02/2007), RENAL INSUFFICIENCY (02/05/2008), and Stroke ([place]).  He presents to the office today to discuss side effects of Repatha. He reports side effects of fatigue, muscle aches, and bruising. She reports calling the nursing line for Repatha and was told them that these symptoms may be side effects of medication.   Lab Results  Component Value Date   CHOL 279 (H) 07/09/2017   HDL 36 (L) 07/09/2017   LDLCALC 212 (H) 07/09/2017   LDLDIRECT 180.7 06/24/2013   TRIG 156 (H) 07/09/2017   CHOLHDL 7.8 07/09/2017     Review of Systems See HPI   Past Medical History:  Diagnosis Date  . DIABETES MELLITUS, TYPE II 01/02/2007  . HYPERLIPIDEMIA 06/18/2007  . HYPERTENSION 01/02/2007  . RENAL INSUFFICIENCY 02/05/2008  . Stroke [facility])     Social History   Socioeconomic History  . Marital status: Married    Spouse name: Not on file  . Number of children: Not on file  . Years of education: Not on file  . Highest education level: Not on file  Occupational History  . Not on file  Social Needs  . Financial resource strain: Not on file  . Food insecurity:    Worry: Not on file    Inability: Not on file  . Transportation needs:    Medical: Not on file    Non-medical: Not on file  Tobacco Use  . Smoking status: Never Smoker  . Smokeless tobacco: Never Used  Substance and Sexual Activity  . Alcohol use: No  . Drug use: No  . Sexual activity: Not on file  Lifestyle  . Physical activity:    Days per week: Not on file    Minutes per session: Not on file  . Stress: Not on file  Relationships  . Social connections:    Talks on phone: Not on file    Gets together: Not on file    Attends religious service: Not on file    Active member of club or  organization: Not on file    Attends meetings of clubs or organizations: Not on file    Relationship status: Not on file  . Intimate partner violence:    Fear of current or ex partner: Not on file    Emotionally abused: Not on file    Physically abused: Not on file    Forced sexual activity: Not on file  Other Topics Concern  . 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 Likes to go to football games.     Past Surgical History:  Procedure Laterality Date  . LOOP RECORDER INSERTION N/A 07/11/2017   Procedure: LOOP RECORDER INSERTION;  Surgeon: Thompson Grayer, MD;  Location: [place] [facility];  Service: Cardiovascular;  Laterality: N/A;  . SHOULDER SURGERY     left  . TEE WITHOUT CARDIOVERSION N/A 07/11/2017   Procedure: TRANSESOPHAGEAL ECHOCARDIOGRAM (TEE);  Surgeon: Dorothy Spark, MD;  Location: [facility] ENDOSCOPY;  Service: Cardiovascular;  Laterality: N/A;  . TRANSURETHRAL RESECTION OF PROSTATE  history of retention/hematuria     Family History  Problem Relation Age of Onset  . Hypertension Mother   . Diabetes Mother   . Stroke Father   . Stroke Maternal Grandmother     Allergies  Allergen Reactions  . Bactrim [Sulfamethoxazole-Trimethoprim] Hives, Itching and Swelling  . Penicillins     Has patient had a PCN reaction causing immediate rash, facial/tongue/throat swelling, SOB or lightheadedness with hypotension: Unk Has patient had a PCN reaction causing severe rash involving mucus membranes or skin necrosis: Unk Has patient had a PCN reaction that required hospitalization: Unk Has patient had a PCN reaction occurring within the last 10 years: No If all of the above answers are "NO", then may proceed with Cephalosporin use.  . Statins     myalgia  . Sulfa Drugs Cross Reactors Hives, Itching and Swelling    Current  Outpatient Medications on File Prior to Visit  Medication Sig Dispense Refill  . aspirin 325 MG EC tablet Take 1 tablet (325 mg total) by mouth daily. 30 tablet 0  . benazepril (LOTENSIN) 40 MG tablet TAKE 1 TABLET(40 MG) BY MOUTH TWICE DAILY 60 tablet 11  . calcitRIOL (ROCALTROL) 0.25 MCG capsule Take 1 capsule by mouth 3 (three) times daily.     . Evolocumab (REPATHA) 140 MG/ML SOSY Inject 140 mg into the skin every 14 (fourteen) days. 2 Syringe 3  . ezetimibe (ZETIA) 10 MG tablet Take 1 tablet (10 mg total) by mouth daily. 90 tablet 1  . glucose blood (ONETOUCH VERIO) test strip 1 each by Other route 2 (two) times daily. Check blood sugars twice per day 180 each 6  . hydrALAZINE (APRESOLINE) 50 MG tablet Take 1 tablet (50 mg total) by mouth 3 (three) times daily. 90 tablet 2  . Insulin Glargine (LANTUS SOLOSTAR) 100 UNIT/ML Solostar Pen Inject 18 Units into the skin daily at 10 pm. INJECT 20 UNITS SUBCUTANEOUSLY ONCE DAILY (Patient taking differently: Inject 15 Units into the skin daily at 10 pm. ) 2 pen 6  . labetalol (NORMODYNE) 200 MG tablet Take 1 tablet (200 mg total) by mouth 3 (three) times daily. 90 tablet 2  . nateglinide (STARLIX) 60 MG tablet Take 1 tablet (60 mg total) by mouth 3 (three) times daily with meals. (Patient taking differently: Take 60 mg by mouth every evening. ) 90 tablet 0  . REPATHA SURECLICK 591 MG/ML SOAJ INJECT 140 MG INTO THE SKIN Q 14 DAYS  3   No current facility-administered medications on file prior to visit.     BP 100/60   Temp 98.9 F (37.2 C) (Oral)   Wt 217 lb (98.4 kg)   BMI 28.63 kg/m       Objective:   Physical Exam  Constitutional: He is oriented to person, place, and time. He appears well-developed and well-nourished. No distress.  Cardiovascular: Normal rate, regular rhythm, normal heart sounds and intact distal pulses. Exam reveals no gallop and no friction rub.  No murmur heard. Pulmonary/Chest: Effort normal and breath sounds normal.  No stridor. No respiratory distress. He has no wheezes. He has no rales. He exhibits no tenderness.  Neurological: He is alert and oriented to person, place, and time.  Skin: Skin is warm. Capillary refill takes less than 2 seconds. He is not diaphoretic.  Small bruise noted on lower back.   Psychiatric: He has a normal mood and affect. His behavior is normal. Judgment and thought content normal.  Nursing note and vitals reviewed.  Assessment & Plan:  1. Medication side effect, initial encounter - Possible side effect of Repatha  - Advised that he should follow up with provider that prescribes this medication. They have an appointment with Dr. Leonie Man next month  - Follow up as needed  Dorothyann Peng, NP

## 2017-11-10 ENCOUNTER — Telehealth: Payer: Self-pay | Admitting: Adult Health

## 2017-11-10 DIAGNOSIS — I129 Hypertensive chronic kidney disease with stage 1 through stage 4 chronic kidney disease, or unspecified chronic kidney disease: Secondary | ICD-10-CM

## 2017-11-10 DIAGNOSIS — N183 Chronic kidney disease, stage 3 unspecified: Secondary | ICD-10-CM

## 2017-11-10 NOTE — Telephone Encounter (Signed)
Copied from [place] [id number]. Topic: Inquiry >> Nov 10, 2017  2:39 PM Margot Ables wrote: Pts spouse checked with insurance for other covered nephrologists.  Elizbeth Squires, MD [place] Phone: [phone]  [facility]: Nadine Counts MD Address: [address], [place] [address] Phone: [phone]

## 2017-11-14 NOTE — Telephone Encounter (Signed)
Referral placed.  No further action required.

## 2017-11-17 ENCOUNTER — Ambulatory Visit (INDEPENDENT_AMBULATORY_CARE_PROVIDER_SITE_OTHER): Payer: Medicare Other | Admitting: *Deleted

## 2017-11-17 DIAGNOSIS — I639 Cerebral infarction, unspecified: Secondary | ICD-10-CM

## 2017-11-17 NOTE — Progress Notes (Signed)
[facility] Summary Report / Loop Recorder 

## 2017-11-20 LAB — CUP PACEART REMOTE DEVICE CHECK
MDC IDC PG IMPLANT DT: 20190108
MDC IDC SESS DTM: 20190414161156

## 2017-11-22 ENCOUNTER — Encounter: Payer: Self-pay | Admitting: Adult Health

## 2017-11-22 ENCOUNTER — Ambulatory Visit: Payer: Medicare Other | Admitting: Adult Health

## 2017-11-22 VITALS — BP 152/80 | Temp 98.0°F | Wt 217.0 lb

## 2017-11-22 DIAGNOSIS — I10 Essential (primary) hypertension: Secondary | ICD-10-CM

## 2017-11-22 DIAGNOSIS — T50905A Adverse effect of unspecified drugs, medicaments and biological substances, initial encounter: Secondary | ICD-10-CM

## 2017-11-22 DIAGNOSIS — R5383 Other fatigue: Secondary | ICD-10-CM

## 2017-11-22 NOTE — Progress Notes (Signed)
Subjective:    Patient ID: [name] [name], male    DOB: 03-29-1946, 72 y.o.   MRN: [id number]  HPI  72 year old male who  has a past medical history of DIABETES MELLITUS, TYPE II (01/02/2007), HYPERLIPIDEMIA (06/18/2007), HYPERTENSION (01/02/2007), RENAL INSUFFICIENCY (02/05/2008), and Stroke ([place]).  He presents to the office today for elevation in blood pressure readings over the last 2 weeks. Per their record BP has been higher in the evening, up to 767 systolic. Many of his BP readings continue to be in the 130-140's/80's. He is also experiencing fatigue and feeling off balance. All of these symptoms started after starting Repatha. His last injection was 2 weeks ago. He denies any headaches, blurred vision, or falls.   Taking medications as directed   Review of Systems See HPI   Past Medical History:  Diagnosis Date  . DIABETES MELLITUS, TYPE II 01/02/2007  . HYPERLIPIDEMIA 06/18/2007  . HYPERTENSION 01/02/2007  . RENAL INSUFFICIENCY 02/05/2008  . Stroke [facility])     Social History   Socioeconomic History  . Marital status: Married    Spouse name: Not on file  . Number of children: Not on file  . Years of education: Not on file  . Highest education level: Not on file  Occupational History  . Not on file  Social Needs  . Financial resource strain: Not on file  . Food insecurity:    Worry: Not on file    Inability: Not on file  . Transportation needs:    Medical: Not on file    Non-medical: Not on file  Tobacco Use  . Smoking status: Never Smoker  . Smokeless tobacco: Never Used  Substance and Sexual Activity  . Alcohol use: No  . Drug use: No  . Sexual activity: Not on file  Lifestyle  . Physical activity:    Days per week: Not on file    Minutes per session: Not on file  . Stress: Not on file  Relationships  . Social connections:    Talks on phone: Not on file    Gets together: Not on file    Attends religious service: Not on file    Active member of club or  organization: Not on file    Attends meetings of clubs or organizations: Not on file    Relationship status: Not on file  . Intimate partner violence:    Fear of current or ex partner: Not on file    Emotionally abused: Not on file    Physically abused: Not on file    Forced sexual activity: Not on file  Other Topics Concern  . 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 Likes to go to football games.     Past Surgical History:  Procedure Laterality Date  . LOOP RECORDER INSERTION N/A 07/11/2017   Procedure: LOOP RECORDER INSERTION;  Surgeon: Thompson Grayer, MD;  Location: [place] [facility];  Service: Cardiovascular;  Laterality: N/A;  . SHOULDER SURGERY     left  . TEE WITHOUT CARDIOVERSION N/A 07/11/2017   Procedure: TRANSESOPHAGEAL ECHOCARDIOGRAM (TEE);  Surgeon: Dorothy Spark, MD;  Location: [facility] ENDOSCOPY;  Service: Cardiovascular;  Laterality: N/A;  . TRANSURETHRAL RESECTION OF PROSTATE     history of retention/hematuria  Family History  Problem Relation Age of Onset  . Hypertension Mother   . Diabetes Mother   . Stroke Father   . Stroke Maternal Grandmother     Allergies  Allergen Reactions  . Bactrim [Sulfamethoxazole-Trimethoprim] Hives, Itching and Swelling  . Penicillins     Has patient had a PCN reaction causing immediate rash, facial/tongue/throat swelling, SOB or lightheadedness with hypotension: Unk Has patient had a PCN reaction causing severe rash involving mucus membranes or skin necrosis: Unk Has patient had a PCN reaction that required hospitalization: Unk Has patient had a PCN reaction occurring within the last 10 years: No If all of the above answers are "NO", then may proceed with Cephalosporin use.  . Statins     myalgia  . Sulfa Drugs Cross Reactors Hives, Itching and Swelling    Current  Outpatient Medications on File Prior to Visit  Medication Sig Dispense Refill  . aspirin 325 MG EC tablet Take 1 tablet (325 mg total) by mouth daily. 30 tablet 0  . benazepril (LOTENSIN) 40 MG tablet TAKE 1 TABLET(40 MG) BY MOUTH TWICE DAILY 60 tablet 11  . calcitRIOL (ROCALTROL) 0.25 MCG capsule Take 1 capsule by mouth 3 (three) times daily.     [place] Kitchen ezetimibe (ZETIA) 10 MG tablet Take 1 tablet (10 mg total) by mouth daily. 90 tablet 1  . glucose blood (ONETOUCH VERIO) test strip 1 each by Other route 2 (two) times daily. Check blood sugars twice per day 180 each 6  . hydrALAZINE (APRESOLINE) 50 MG tablet Take 1 tablet (50 mg total) by mouth 3 (three) times daily. 90 tablet 2  . Insulin Glargine (LANTUS SOLOSTAR) 100 UNIT/ML Solostar Pen Inject 18 Units into the skin daily at 10 pm. INJECT 20 UNITS SUBCUTANEOUSLY ONCE DAILY (Patient taking differently: Inject 15 Units into the skin daily at 10 pm. ) 2 pen 6  . labetalol (NORMODYNE) 200 MG tablet Take 1 tablet (200 mg total) by mouth 3 (three) times daily. 90 tablet 2  . nateglinide (STARLIX) 60 MG tablet Take 1 tablet (60 mg total) by mouth 3 (three) times daily with meals. (Patient taking differently: Take 60 mg by mouth every evening. ) 90 tablet 0   No current facility-administered medications on file prior to visit.     BP (!) 152/80   Temp 98 F (36.7 C) (Oral)   Wt 217 lb (98.4 kg)   BMI 28.63 kg/m       Objective:   Physical Exam  Constitutional: He is oriented to person, place, and time. He appears well-developed and well-nourished. No distress.  Cardiovascular: Normal rate, regular rhythm, normal heart sounds and intact distal pulses. Exam reveals no friction rub.  No murmur heard. Pulmonary/Chest: Effort normal and breath sounds normal.  Musculoskeletal: Normal range of motion.  Neurological: He is alert and oriented to person, place, and time.  Skin: He is not diaphoretic.  Psychiatric: He has a normal mood and affect. His  behavior is normal. Judgment and thought content normal.  Vitals reviewed.     Assessment & Plan:  Transient elevations in blood pressure with fatigue and unsteadiness. Uptodate shows that a small population of patients can experience these symptoms with Repatha. Advised that since [place] has a long half like life to continue to monitor over the next two weeks.   Follow up if symptoms continue   Dorothyann Peng, NP

## 2017-11-30 ENCOUNTER — Other Ambulatory Visit: Payer: Self-pay | Admitting: Adult Health

## 2017-11-30 DIAGNOSIS — I129 Hypertensive chronic kidney disease with stage 1 through stage 4 chronic kidney disease, or unspecified chronic kidney disease: Secondary | ICD-10-CM

## 2017-12-01 NOTE — Telephone Encounter (Signed)
Sent to the pharmacy by e-scribe. 

## 2017-12-07 ENCOUNTER — Encounter: Payer: Self-pay | Admitting: Physical Medicine & Rehabilitation

## 2017-12-07 ENCOUNTER — Encounter: Payer: Medicare Other | Attending: Physical Medicine & Rehabilitation | Admitting: Physical Medicine & Rehabilitation

## 2017-12-07 VITALS — BP 160/75 | HR 58 | Ht 74.0 in | Wt 219.0 lb

## 2017-12-07 DIAGNOSIS — I10 Essential (primary) hypertension: Secondary | ICD-10-CM | POA: Diagnosis not present

## 2017-12-07 DIAGNOSIS — Z833 Family history of diabetes mellitus: Secondary | ICD-10-CM | POA: Diagnosis not present

## 2017-12-07 DIAGNOSIS — N183 Chronic kidney disease, stage 3 (moderate): Secondary | ICD-10-CM | POA: Insufficient documentation

## 2017-12-07 DIAGNOSIS — I6522 Occlusion and stenosis of left carotid artery: Secondary | ICD-10-CM | POA: Diagnosis not present

## 2017-12-07 DIAGNOSIS — E785 Hyperlipidemia, unspecified: Secondary | ICD-10-CM | POA: Insufficient documentation

## 2017-12-07 DIAGNOSIS — E1122 Type 2 diabetes mellitus with diabetic chronic kidney disease: Secondary | ICD-10-CM | POA: Insufficient documentation

## 2017-12-07 DIAGNOSIS — Z794 Long term (current) use of insulin: Secondary | ICD-10-CM

## 2017-12-07 DIAGNOSIS — I6930 Unspecified sequelae of cerebral infarction: Secondary | ICD-10-CM | POA: Diagnosis not present

## 2017-12-07 DIAGNOSIS — Z823 Family history of stroke: Secondary | ICD-10-CM | POA: Diagnosis not present

## 2017-12-07 DIAGNOSIS — Z9889 Other specified postprocedural states: Secondary | ICD-10-CM | POA: Insufficient documentation

## 2017-12-07 DIAGNOSIS — Z8249 Family history of ischemic heart disease and other diseases of the circulatory system: Secondary | ICD-10-CM | POA: Insufficient documentation

## 2017-12-07 DIAGNOSIS — I69322 Dysarthria following cerebral infarction: Secondary | ICD-10-CM

## 2017-12-07 DIAGNOSIS — I129 Hypertensive chronic kidney disease with stage 1 through stage 4 chronic kidney disease, or unspecified chronic kidney disease: Secondary | ICD-10-CM | POA: Insufficient documentation

## 2017-12-07 DIAGNOSIS — R4701 Aphasia: Secondary | ICD-10-CM | POA: Diagnosis not present

## 2017-12-07 DIAGNOSIS — I69351 Hemiplegia and hemiparesis following cerebral infarction affecting right dominant side: Secondary | ICD-10-CM | POA: Insufficient documentation

## 2017-12-07 DIAGNOSIS — I63512 Cerebral infarction due to unspecified occlusion or stenosis of left middle cerebral artery: Secondary | ICD-10-CM | POA: Insufficient documentation

## 2017-12-07 NOTE — Progress Notes (Addendum)
Subjective:    Patient ID: [name] [name], male    DOB: Apr 17, 1946, 72 y.o.   MRN: [id number]  HPI 72 y.o. male with history of HTN, T2DM presents for follow up for left MCA infarct.  Last clinic visit 09/06/17.  Wife provides much of history. Since that time, pt was started on new medication, which he had side effects with. He continues HEP.  He sees Neurology next week.  His BP and CBGs had been controlled before introduction of new medication, but appears to be improving. He sees Vascular for follow up in ~3 months. Denies falls. He notes improvement in speech.  Pain Inventory Average Pain 4 Pain Right Now 3 My pain is intermittent and aching  In the last 24 hours, has pain interfered with the following? General activity 4 Relation with others 3 Enjoyment of life 7 What TIME of day is your pain at its worst? daytime Sleep (in general) Good  Pain is worse with: some activites Pain improves with: rest Relief from Meds: 0  Mobility walk without assistance how many minutes can you walk? 15 ability to climb steps?  yes do you drive?  no Do you have any goals in this area?  yes  Function retired Do you have any goals in this area?  yes  Neuro/Psych weakness tremor tingling dizziness  Prior Studies Any changes since last visit?  no  Physicians involved in your care Any changes since last visit?  no   Family History  Problem Relation Age of Onset  . Hypertension Mother   . Diabetes Mother   . Stroke Father   . Stroke Maternal Grandmother    Social History   Socioeconomic History  . Marital status: Married    Spouse name: Not on file  . Number of children: Not on file  . Years of education: Not on file  . Highest education level: Not on file  Occupational History  . Not on file  Social Needs  . Financial resource strain: Not on file  . Food insecurity:    Worry: Not on file    Inability: Not on file  . Transportation needs:    Medical: Not on file      Non-medical: Not on file  Tobacco Use  . Smoking status: Never Smoker  . Smokeless tobacco: Never Used  Substance and Sexual Activity  . Alcohol use: No  . Drug use: No  . Sexual activity: Not on file  Lifestyle  . Physical activity:    Days per week: Not on file    Minutes per session: Not on file  . Stress: Not on file  Relationships  . Social connections:    Talks on phone: Not on file    Gets together: Not on file    Attends religious service: Not on file    Active member of club or organization: Not on file    Attends meetings of clubs or organizations: Not on file    Relationship status: Not on file  Other Topics Concern  . 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 Likes to go to football games.    Past Surgical History:  Procedure Laterality Date  . LOOP RECORDER INSERTION N/A 07/11/2017   Procedure: LOOP RECORDER INSERTION;  Surgeon: Thompson Grayer,  MD;  Location: [place] [facility] LAB;  Service: Cardiovascular;  Laterality: N/A;  . SHOULDER SURGERY     left  . TEE WITHOUT CARDIOVERSION N/A 07/11/2017   Procedure: TRANSESOPHAGEAL ECHOCARDIOGRAM (TEE);  Surgeon: Dorothy Spark, MD;  Location: [facility] ENDOSCOPY;  Service: Cardiovascular;  Laterality: N/A;  . TRANSURETHRAL RESECTION OF PROSTATE     history of retention/hematuria    Past Medical History:  Diagnosis Date  . DIABETES MELLITUS, TYPE II 01/02/2007  . HYPERLIPIDEMIA 06/18/2007  . HYPERTENSION 01/02/2007  . RENAL INSUFFICIENCY 02/05/2008  . Stroke ([place])    BP (!) 160/75   Pulse (!) 58   Ht 6\' 2"  (1.88 m)   Wt 219 lb (99.3 kg)   SpO2 98%   BMI 28.12 kg/m   Opioid Risk Score:   Fall Risk Score:  `1  Depression screen PHQ 2/9  Depression screen [facility] 2/9 07/27/2017 04/08/2015 04/02/2013  Decreased Interest 3 0 0  Down, Depressed, Hopeless 1 0 0  PHQ - 2 Score  4 0 0  Altered sleeping 0 - -  Tired, decreased energy 1 - -  Change in appetite 0 - -  Feeling bad or failure about yourself  1 - -  Trouble concentrating 1 - -  Moving slowly or fidgety/restless 1 - -  Suicidal thoughts 0 - -  PHQ-9 Score 8 - -  Difficult doing work/chores Somewhat difficult - -     Review of Systems  Constitutional: Positive for diaphoresis and unexpected weight change.  HENT: Negative.   Eyes: Negative.   Respiratory: Positive for cough.   Cardiovascular: Negative.   Gastrointestinal: Negative.   Endocrine:       High/low sugar   Genitourinary: Negative.   Musculoskeletal: Positive for gait problem.       Spasms   Skin: Positive for rash.  Allergic/Immunologic: Negative.   Neurological: Positive for dizziness, tremors, weakness and numbness.  Hematological: Negative.   Psychiatric/Behavioral: Negative.   All other systems reviewed and are negative.      Objective:   Physical Exam Constitutional: He appears well-developed and well-nourished.  HENT: Normocephalic and atraumatic.  Eyes: EOM are normal. No discharge.  Cardiovascular: RRR. No JVD. Respiratory: Effort normal and breath sounds normal.  GI: Bowel sounds are normal. There is no tenderness.  Musculoskeletal: He exhibits no edema, no tenderness.  Gait: Slow cadence Neurological: He is alert and oriented.  Motor: RUE: 5/5 prox to distal.  RLE: 5/5 proximal to distal Skin: Skin is warm and dry.  Psychiatric: Flat.    Assessment & Plan:  72 y.o. male with history of HTN, T2DM presents for follow up for left MCA infarct.  1. Right hemparesis and aphasia secondary to left MCA infarct  Cont HEP  Cont follow up with Neurology  2. HTN  Elevated, per wife, had been controlled until introduction of new medication  3. Left ICA plaque:    Saw Vascular, no plans for intervention risks>benefits  Follow up in 3 months.  4. DM   Labile since new medications introduced, improving  Cont  checks  5. Dysarthria   Improving

## 2017-12-11 LAB — CUP PACEART REMOTE DEVICE CHECK
Implantable Pulse Generator Implant Date: 20190108
MDC IDC SESS DTM: 20190517163516

## 2017-12-15 ENCOUNTER — Ambulatory Visit: Payer: Medicare Other | Admitting: Adult Health

## 2017-12-15 ENCOUNTER — Encounter: Payer: Self-pay | Admitting: Adult Health

## 2017-12-15 VITALS — BP 160/88 | HR 52 | Ht 74.0 in | Wt 218.6 lb

## 2017-12-15 DIAGNOSIS — E1122 Type 2 diabetes mellitus with diabetic chronic kidney disease: Secondary | ICD-10-CM

## 2017-12-15 DIAGNOSIS — I10 Essential (primary) hypertension: Secondary | ICD-10-CM

## 2017-12-15 DIAGNOSIS — I63512 Cerebral infarction due to unspecified occlusion or stenosis of left middle cerebral artery: Secondary | ICD-10-CM | POA: Diagnosis not present

## 2017-12-15 DIAGNOSIS — E782 Mixed hyperlipidemia: Secondary | ICD-10-CM | POA: Diagnosis not present

## 2017-12-15 DIAGNOSIS — N183 Chronic kidney disease, stage 3 unspecified: Secondary | ICD-10-CM

## 2017-12-15 DIAGNOSIS — Z794 Long term (current) use of insulin: Secondary | ICD-10-CM

## 2017-12-15 NOTE — Progress Notes (Signed)
[facility] [address] [place]. [place] [address] [phone]       OFFICE FOLLOW-UP NOTE  Mr. Ric Rosenberg Broerman Date of Birth:  [birth year] Medical Record Number:  [id number]   HPI:Mr. Eagleson is a 72 year old pleasant African-American male seen today for the first office follow-up visit following hospital admission for stroke in January 1971. History is obtained from the patient, review of electronic medical records and I have personally reviewed imaging films.Trystian Crisanto Bordersis an 72 y.o.malemalewith medical history significant ofdiabetes mellitus type 2, CKD stage III, hypertension, hyperlipidemiapresented as stroke alert with aphasia. LSN was 2 am according to wife.  When he woke up at 8am,he was confused, speaking only few words. After few hours, EMS was alerted. BP was 250 systolic per EMS. On arrival patient had aphasia and mild right side weakness.Date last known well:1.5.19 Time last known well:2 am.tPA Given:no, outside window. NIHSS:4 Baseline MRS0. CT) showed no significant head large vessel Tetanus stenosis but a lipid-laden plaque in the proximal left ICA. CT perfusion showed no significant cord infarct. MRI scan of the brain showed multifocal areas of infarcts involving left MCA territory left frontal cortex and subcortical region. Transthoracic echo showed normal ejection fraction without cardiac source of embolism. Extracranial Dopplers were unremarkable. Transesophageal echo showed no Source of embolism. Patient had loop recorder inserted follow-up paroxysmal atrial fibrillation and so far it has not yet been found. LDL cholesterol was elevated at 212 mg percent but patient has a history of intolerance to statins and has tried Lipitor and simvastatin the past. He is currently on Zetia. He states his done well since discharge is still having some occasional speech difficulties particularly when tired or tries to talk too fast. This is tolerating aspirin  without bleeding or bruising. His fasting sugars are  pretty good and ranges in the low 100-110 range. His blood pressure normally at home is in the 130 to 140 range but today it is elevated in office at 170/92.   12/15/17 UPDATE: Patient returns today for 71-month follow-up and is accompanied by his wife.  He has completed therapies but continues speech exercises at home such as reading, math word problems, reading comprehension exercises and coloring by numbers.  He continues to take aspirin without side effects of bleeding or bruising continues to take Zetia for cholesterol control.  Patient did take 2 doses of Repatha injections and had side effects such as back pain, overall discomfort, fatigue, blisters, joint discomfort and increased coughing, along with erratic BP and glucose numbers.  After speaking to [place] and PCP, it was advised to discontinue this medication and last injection was on 11/04/2017.  Since this time, BP and glucose numbers have been normalizing but can still be out of range at times.  Blood pressure today mildly elevated at 160/88 but per wife, BP this morning 139/77.  Patient questions whether he can begin driving at this time.  As no physical deficits were found, comprehension seem to be intact and no vision issues observed, it was recommended for wife to provide along with patient for the first few times, drive only places that he is familiar with and to avoid main roads or highways.  Patient is in agreement to this.  Denies new or worsening stroke/TIA symptoms.  14 system review of systems is positive for fatigue, cough, joint pain, back pain, rash, cold intolerance, dizziness, weakness, tremors, and agitation and all the systems negative  PMH:  Past Medical History:  Diagnosis Date  .  DIABETES MELLITUS, TYPE II 01/02/2007  . HYPERLIPIDEMIA 06/18/2007  . HYPERTENSION 01/02/2007  . RENAL INSUFFICIENCY 02/05/2008  . Stroke [facility])     Social History:  Social History    Socioeconomic History  . Marital status: Married    Spouse name: Not on file  . Number of children: Not on file  . Years of education: Not on file  . Highest education level: Not on file  Occupational History  . Not on file  Social Needs  . Financial resource strain: Not on file  . Food insecurity:    Worry: Not on file    Inability: Not on file  . Transportation needs:    Medical: Not on file    Non-medical: Not on file  Tobacco Use  . Smoking status: Never Smoker  . Smokeless tobacco: Never Used  Substance and Sexual Activity  . Alcohol use: No  . Drug use: No  . Sexual activity: Not on file  Lifestyle  . Physical activity:    Days per week: Not on file    Minutes per session: Not on file  . Stress: Not on file  Relationships  . Social connections:    Talks on phone: Not on file    Gets together: Not on file    Attends religious service: Not on file    Active member of club or organization: Not on file    Attends meetings of clubs or organizations: Not on file    Relationship status: Not on file  . Intimate partner violence:    Fear of current or ex partner: Not on file    Emotionally abused: Not on file    Physically abused: Not on file    Forced sexual activity: Not on file  Other Topics Concern  . 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 Likes to go to football games.     Medications:   Current Outpatient Medications on File Prior to Visit  Medication Sig Dispense Refill  . aspirin 325 MG EC tablet Take 1 tablet (325 mg total) by mouth daily. 30 tablet 0  . benazepril (LOTENSIN) 40 MG tablet TAKE 1 TABLET(40 MG) BY MOUTH TWICE DAILY 60 tablet 11  . calcitRIOL (ROCALTROL) 0.25 MCG capsule Take 1 capsule by mouth 3 (three) times daily.     Marland Kitchen ezetimibe (ZETIA) 10 MG tablet Take 1 tablet (10 mg total) by  mouth daily. 90 tablet 1  . hydrALAZINE (APRESOLINE) 50 MG tablet TAKE 1 TABLET(50 MG) BY MOUTH THREE TIMES DAILY 270 tablet 2  . Insulin Glargine (LANTUS SOLOSTAR) 100 UNIT/ML Solostar Pen Inject 18 Units into the skin daily at 10 pm. INJECT 20 UNITS SUBCUTANEOUSLY ONCE DAILY (Patient taking differently: Inject 15 Units into the skin daily at 10 pm. ) 2 pen 6  . labetalol (NORMODYNE) 200 MG tablet Take 1 tablet (200 mg total) by mouth 3 (three) times daily. 90 tablet 2  . nateglinide (STARLIX) 60 MG tablet Take 1 tablet (60 mg total) by mouth 3 (three) times daily with meals. (Patient taking differently: Take 60 mg by mouth every evening. ) 90 tablet 0   No current facility-administered medications on file prior to visit.     Allergies:   Allergies  Allergen Reactions  . Repatha [Evolocumab] Hypertension  .  Bactrim [Sulfamethoxazole-Trimethoprim] Hives, Itching and Swelling  . Penicillins     Has patient had a PCN reaction causing immediate rash, facial/tongue/throat swelling, SOB or lightheadedness with hypotension: Unk Has patient had a PCN reaction causing severe rash involving mucus membranes or skin necrosis: Unk Has patient had a PCN reaction that required hospitalization: Unk Has patient had a PCN reaction occurring within the last 10 years: No If all of the above answers are "NO", then may proceed with Cephalosporin use.  . Statins     myalgia  . Sulfa Drugs Cross Reactors Hives, Itching and Swelling    Physical Exam General: well developed, well nourished pleasant elderly African-American male, seated, in no evident distress Head: head normocephalic and atraumatic.  Neck: supple with no carotid or supraclavicular bruits Cardiovascular: regular rate and rhythm, no murmurs Musculoskeletal: no deformity Skin:  no rash/petichiae Vascular:  Normal pulses all extremities Vitals:   12/15/17 0916  BP: (!) 160/88  Pulse: (!) 52   Neurologic Exam Mental Status: Awake and fully  alert.  Mild dysarthria present.  Oriented to place and time. Recent and remote memory intact. Attention span, concentration and fund of knowledge appropriate. Mood and affect appropriate. Cranial Nerves: Fundoscopic exam reveals sharp disc margins. Pupils equal, briskly reactive to light. Extraocular movements full without nystagmus. Visual fields full to confrontation. Hearing intact. Facial sensation intact. Face, tongue, palate moves normally and symmetrically.  Motor: Normal bulk and tone. Normal strength in all tested extremity muscles. Sensory.: intact to touch ,pinprick .position and vibratory sensation.  Coordination: Rapid alternating movements normal in all extremities. Finger-to-nose and heel-to-shin performed accurately bilaterally. Gait and Station: Arises from chair without difficulty. Stance is normal. Gait demonstrates normal stride length and balance . Able to heel, toe and tandem walk without difficulty.  Reflexes: 1+ and symmetric. Toes downgoing.     ASSESSMENT: 79 year African-American male with embolic left MCA branch infarct in January 2019 of cryptogenic etiology. Vascular risk factors of hypertension and hyperlipidemia.  Patient returns today for follow-up visit.    PLAN: -Continue aspirin 325 mg daily  and Zetia for secondary stroke prevention -F/u with PCP regarding your HLD, HTN and DM management -Advised to continue home exercises -continue to monitor BP at home -Maintain strict control of hypertension with blood pressure goal below 130/90, diabetes with hemoglobin A1c goal below 6.5% and cholesterol with LDL cholesterol (bad cholesterol) goal below 70 mg/dL. I also advised the patient to eat a healthy diet with plenty of whole grains, cereals, fruits and vegetables, exercise regularly and maintain ideal body weight.  Follow up in 6 months or call earlier if needed  Greater than 50% of time during this 25 minute visit was spent on counseling,explanation of  diagnosis of left MCA, reviewing risk factor management of HLD and HTN, planning of further management, discussion with patient and family and coordination of care  Venancio Poisson, [facility]  [facility] [address] [place], [place] [address]  Phone [phone] Fax [phone]

## 2017-12-15 NOTE — Patient Instructions (Signed)
Continue aspirin 325 mg daily  and Zetia  for secondary stroke prevention  Continue to follow up with PCP regarding cholesterol, blood pressure and diabetes management   Continue home exercises   Continue to monitor blood pressure at home  Continue to stay active and eat a healthy diet  Maintain strict control of hypertension with blood pressure goal below 130/90, diabetes with hemoglobin A1c goal below 6.5% and cholesterol with LDL cholesterol (bad cholesterol) goal below 70 mg/dL. I also advised the patient to eat a healthy diet with plenty of whole grains, cereals, fruits and vegetables, exercise regularly and maintain ideal body weight.  Followup in the future with me in 6 months or call earlier if needed        Thank you for coming to see [place] at [facility]. I hope we have been able to provide you high quality care today.  You may receive a patient satisfaction survey over the next few weeks. We would appreciate your feedback and comments so that we may continue to improve ourselves and the health of our patients.

## 2017-12-15 NOTE — Progress Notes (Signed)
I agree with the above plan 

## 2017-12-20 ENCOUNTER — Ambulatory Visit (INDEPENDENT_AMBULATORY_CARE_PROVIDER_SITE_OTHER): Payer: Medicare Other | Admitting: *Deleted

## 2017-12-20 DIAGNOSIS — I639 Cerebral infarction, unspecified: Secondary | ICD-10-CM

## 2017-12-20 NOTE — Progress Notes (Signed)
[facility] Summary Report / Loop Recorder 

## 2018-01-02 LAB — CBC AND DIFFERENTIAL
HEMATOCRIT: 37 — AB (ref 41–53)
Hemoglobin: 12.3 — AB (ref 13.5–17.5)
Neutrophils Absolute: 7
Platelets: 242 (ref 150–399)
WBC: 9.2

## 2018-01-02 LAB — HEPATIC FUNCTION PANEL
ALK PHOS: 44 (ref 25–125)
ALT: 14 (ref 10–40)
AST: 17 (ref 14–40)
BILIRUBIN, TOTAL: 0.4

## 2018-01-02 LAB — BASIC METABOLIC PANEL
BUN: 44 — AB (ref 4–21)
Creatinine: 2.5 — AB (ref 0.6–1.3)
GLUCOSE: 125
POTASSIUM: 4.6 (ref 3.4–5.3)
SODIUM: 139 (ref 137–147)

## 2018-01-02 LAB — VITAMIN D 25 HYDROXY (VIT D DEFICIENCY, FRACTURES): Vit D, 25-Hydroxy: 42

## 2018-01-03 ENCOUNTER — Telehealth: Payer: Self-pay | Admitting: *Deleted

## 2018-01-03 NOTE — Telephone Encounter (Signed)
Copied from [place] [id number]. Topic: General - Other >> Jan 03, 2018  8:31 AM Judyann Munson wrote: Reason for CRM:  [name] is calling to speak with [facility] in regards to when the patient needs to schedule an appt. Her best contact number is [phone]. Please advise

## 2018-01-05 NOTE — Telephone Encounter (Signed)
Spoke to Mrs. Hainer, pt seen new nephrologist this past Tuesday and really liked him.  Pt seen [facility] and will follow up the end of Aug.  Would like to know when he needs to follow up with [facility].  Please advise.

## 2018-01-07 NOTE — Telephone Encounter (Signed)
We will see him every three months for diabetes management ( due now)

## 2018-01-09 NOTE — Telephone Encounter (Signed)
Pt now scheduled for 01/10/18 @ 10 AM.  [name] (wife) notified that [name] will see pt every 3 months.

## 2018-01-10 ENCOUNTER — Ambulatory Visit: Payer: Medicare Other | Admitting: Adult Health

## 2018-01-10 ENCOUNTER — Encounter: Payer: Self-pay | Admitting: Adult Health

## 2018-01-10 VITALS — BP 148/80 | Temp 98.3°F | Wt 220.0 lb

## 2018-01-10 DIAGNOSIS — N183 Chronic kidney disease, stage 3 unspecified: Secondary | ICD-10-CM

## 2018-01-10 DIAGNOSIS — E1122 Type 2 diabetes mellitus with diabetic chronic kidney disease: Secondary | ICD-10-CM

## 2018-01-10 DIAGNOSIS — Z794 Long term (current) use of insulin: Secondary | ICD-10-CM

## 2018-01-10 LAB — POCT GLYCOSYLATED HEMOGLOBIN (HGB A1C): HbA1c, POC (controlled diabetic range): 5.7 % (ref 0.0–7.0)

## 2018-01-10 NOTE — Progress Notes (Signed)
Subjective:    Patient ID: [name] [name], male    DOB: Jan 17, 1946, 72 y.o.   MRN: [id number]  HPI  72 year old male who  has a past medical history of DIABETES MELLITUS, TYPE II (01/02/2007), HYPERLIPIDEMIA (06/18/2007), HYPERTENSION (01/02/2007), RENAL INSUFFICIENCY (02/05/2008), and Stroke ([place]).  He presents to the office today for three month follow up regarding diabetes. He is currently prescribed Starlix 60 mg and Lantus 15 units QHS. He has been monitoring his blood sugars at home on a regular basis and has readings between 80-150. He has been working on diet and has started exercising again s/p CVA.   He has been experiencing episodes of hypoglycemia when he wakes up in the morning but is not checking blood sugars in the morning   Lab Results  Component Value Date   HGBA1C 5.7 01/10/2018    Review of Systems See HPI   Past Medical History:  Diagnosis Date  . DIABETES MELLITUS, TYPE II 01/02/2007  . HYPERLIPIDEMIA 06/18/2007  . HYPERTENSION 01/02/2007  . RENAL INSUFFICIENCY 02/05/2008  . Stroke [facility])     Social History   Socioeconomic History  . Marital status: Married    Spouse name: Not on file  . Number of children: Not on file  . Years of education: Not on file  . Highest education level: Not on file  Occupational History  . Not on file  Social Needs  . Financial resource strain: Not on file  . Food insecurity:    Worry: Not on file    Inability: Not on file  . Transportation needs:    Medical: Not on file    Non-medical: Not on file  Tobacco Use  . Smoking status: Never Smoker  . Smokeless tobacco: Never Used  Substance and Sexual Activity  . Alcohol use: No  . Drug use: No  . Sexual activity: Not on file  Lifestyle  . Physical activity:    Days per week: Not on file    Minutes per session: Not on file  . Stress: Not on file  Relationships  . Social connections:    Talks on phone: Not on file    Gets together: Not on file    Attends religious  service: Not on file    Active member of club or organization: Not on file    Attends meetings of clubs or organizations: Not on file    Relationship status: Not on file  . Intimate partner violence:    Fear of current or ex partner: Not on file    Emotionally abused: Not on file    Physically abused: Not on file    Forced sexual activity: Not on file  Other Topics Concern  . 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 Likes to go to football games.     Past Surgical History:  Procedure Laterality Date  . LOOP RECORDER INSERTION N/A 07/11/2017   Procedure: LOOP RECORDER INSERTION;  Surgeon: Thompson Grayer, MD;  Location: [facility];  Service: Cardiovascular;  Laterality: N/A;  . SHOULDER SURGERY     left  . TEE WITHOUT CARDIOVERSION N/A 07/11/2017   Procedure: TRANSESOPHAGEAL ECHOCARDIOGRAM (TEE);  Surgeon: Dorothy Spark, MD;  Location: [facility];  Service: Cardiovascular;  Laterality: N/A;  .  TRANSURETHRAL RESECTION OF PROSTATE     history of retention/hematuria     Family History  Problem Relation Age of Onset  . Hypertension Mother   . Diabetes Mother   . Stroke Father   . Stroke Maternal Grandmother     Allergies  Allergen Reactions  . Repatha [Evolocumab] Hypertension  . Bactrim [Sulfamethoxazole-Trimethoprim] Hives, Itching and Swelling  . Penicillins     Has patient had a PCN reaction causing immediate rash, facial/tongue/throat swelling, SOB or lightheadedness with hypotension: Unk Has patient had a PCN reaction causing severe rash involving mucus membranes or skin necrosis: Unk Has patient had a PCN reaction that required hospitalization: Unk Has patient had a PCN reaction occurring within the last 10 years: No If all of the above answers are "NO", then may proceed with Cephalosporin use.  . Statins      myalgia  . Sulfa Drugs Cross Reactors Hives, Itching and Swelling    Current Outpatient Medications on File Prior to Visit  Medication Sig Dispense Refill  . aspirin 325 MG EC tablet Take 1 tablet (325 mg total) by mouth daily. 30 tablet 0  . benazepril (LOTENSIN) 40 MG tablet TAKE 1 TABLET(40 MG) BY MOUTH TWICE DAILY 60 tablet 11  . calcitRIOL (ROCALTROL) 0.25 MCG capsule Take 1 capsule by mouth 3 (three) times daily.     Marland Kitchen ezetimibe (ZETIA) 10 MG tablet Take 1 tablet (10 mg total) by mouth daily. 90 tablet 1  . hydrALAZINE (APRESOLINE) 50 MG tablet TAKE 1 TABLET(50 MG) BY MOUTH THREE TIMES DAILY 270 tablet 2  . Insulin Glargine (LANTUS SOLOSTAR) 100 UNIT/ML Solostar Pen Inject 18 Units into the skin daily at 10 pm. INJECT 20 UNITS SUBCUTANEOUSLY ONCE DAILY (Patient taking differently: Inject 15 Units into the skin daily at 10 pm. ) 2 pen 6  . labetalol (NORMODYNE) 200 MG tablet Take 1 tablet (200 mg total) by mouth 3 (three) times daily. 90 tablet 2  . nateglinide (STARLIX) 60 MG tablet Take 1 tablet (60 mg total) by mouth 3 (three) times daily with meals. (Patient taking differently: Take 60 mg by mouth every evening. ) 90 tablet 0   No current facility-administered medications on file prior to visit.     BP (!) 148/80   Temp 98.3 F (36.8 C) (Oral)   Wt 220 lb (99.8 kg)   BMI 28.25 kg/m       Objective:   Physical Exam  Constitutional: He is oriented to person, place, and time. He appears well-developed and well-nourished. No distress.  Cardiovascular: Normal rate, regular rhythm, normal heart sounds and intact distal pulses. Exam reveals no gallop and no friction rub.  No murmur heard. Pulmonary/Chest: Effort normal and breath sounds normal. No stridor. No respiratory distress. He has no wheezes. He has no rales. He exhibits no tenderness.  Neurological: He is alert and oriented to person, place, and time.  Skin: Skin is warm and dry. He is not diaphoretic.  Psychiatric: He  has a normal mood and affect. His behavior is normal. Judgment and thought content normal.  Vitals reviewed.      Assessment & Plan:  1. Type 2 diabetes mellitus with stage 3 chronic kidney disease, with long-term current use of insulin (HCC) - POCT A1C- 5.7 - has improved  - Will decrease Lantus from 15 units to 12 units  - Follow up in 3 months or sooner if needed  Dorothyann Peng, NP

## 2018-01-13 ENCOUNTER — Other Ambulatory Visit: Payer: Self-pay | Admitting: Adult Health

## 2018-01-16 NOTE — Telephone Encounter (Signed)
Filled on 10/18/17 for 9 months.  Request is early.

## 2018-01-17 ENCOUNTER — Other Ambulatory Visit: Payer: Self-pay | Admitting: Adult Health

## 2018-01-17 ENCOUNTER — Ambulatory Visit: Payer: Medicare Other | Admitting: Adult Health

## 2018-01-17 ENCOUNTER — Encounter: Payer: Self-pay | Admitting: Family Medicine

## 2018-01-18 ENCOUNTER — Other Ambulatory Visit: Payer: Self-pay | Admitting: Internal Medicine

## 2018-01-18 NOTE — Telephone Encounter (Signed)
Sent to the pharmacy by e-scribe. 

## 2018-01-22 ENCOUNTER — Ambulatory Visit (INDEPENDENT_AMBULATORY_CARE_PROVIDER_SITE_OTHER): Payer: Medicare Other | Admitting: *Deleted

## 2018-01-22 DIAGNOSIS — I639 Cerebral infarction, unspecified: Secondary | ICD-10-CM | POA: Diagnosis not present

## 2018-01-22 NOTE — Progress Notes (Signed)
[facility] Summary Report / Loop Recorder 

## 2018-01-24 LAB — CUP PACEART REMOTE DEVICE CHECK
Date Time Interrogation Session: 20190619164051
MDC IDC PG IMPLANT DT: 20190108

## 2018-02-22 ENCOUNTER — Telehealth: Payer: Self-pay

## 2018-02-22 NOTE — Telephone Encounter (Signed)
Copied from [place] [phone]. Topic: Inquiry >> Feb 22, 2018  3:58 PM Vernona Rieger wrote: Reason for CRM: [name] would like Misty to give her a call about his Nephrologist. Call back # [phone]

## 2018-02-23 NOTE — Telephone Encounter (Signed)
Called and spoke with [name] and she stated that she wanted to make Misty/[name] aware that the pt has changed nephrologists.  He is now seeing a doctor at [facility] ( not in the [facility] system).  He was seen last Friday and they did labs and UA and they did changed a medication and they are going to fax these OV notes over.  [name] wanted to have Misty keep an eye out for these.  FYI for misty.

## 2018-02-26 ENCOUNTER — Ambulatory Visit (INDEPENDENT_AMBULATORY_CARE_PROVIDER_SITE_OTHER): Payer: Medicare Other | Admitting: *Deleted

## 2018-02-26 DIAGNOSIS — I639 Cerebral infarction, unspecified: Secondary | ICD-10-CM

## 2018-02-26 NOTE — Progress Notes (Signed)
[facility] Summary Report / Loop Recorder 

## 2018-02-28 ENCOUNTER — Ambulatory Visit (HOSPITAL_COMMUNITY)
Admission: RE | Admit: 2018-02-28 | Discharge: 2018-02-28 | Disposition: A | Discharge status: Home / Self Care | Payer: Medicare Other | Source: Ambulatory Visit | Attending: Vascular Surgery | Admitting: Vascular Surgery

## 2018-02-28 ENCOUNTER — Ambulatory Visit: Payer: Medicare Other | Admitting: Vascular Surgery

## 2018-02-28 ENCOUNTER — Encounter: Payer: Self-pay | Admitting: Vascular Surgery

## 2018-02-28 VITALS — BP 143/73 | HR 54 | Temp 97.9°F | Resp 16 | Ht 74.0 in | Wt 218.0 lb

## 2018-02-28 DIAGNOSIS — I6529 Occlusion and stenosis of unspecified carotid artery: Secondary | ICD-10-CM

## 2018-02-28 DIAGNOSIS — I6523 Occlusion and stenosis of bilateral carotid arteries: Secondary | ICD-10-CM | POA: Insufficient documentation

## 2018-02-28 DIAGNOSIS — I6522 Occlusion and stenosis of left carotid artery: Secondary | ICD-10-CM

## 2018-02-28 NOTE — Telephone Encounter (Signed)
I am aware.  New nephrologist updated in care team.

## 2018-02-28 NOTE — Progress Notes (Signed)
Patient name: [name] [name] MRN: [id number] DOB: 1946/05/23 Sex: male  REASON FOR VISIT:   Follow-up of left carotid stenosis.  HPI:   [name] [name] is a pleasant 72 y.o. male who I saw in consultation on 08/30/2017 with a left carotid stenosis.  The patient was admitted in January of this year with expressive a aphasia and underwent an extensive work-up including a CT angiogram of the neck which suggested plaque in the left internal carotid artery.  However duplex at that time was unremarkable.  Of note, CT of the head at the time of the admission did not show evidence of a stroke.  I did review the CT angiogram which did show some plaque in the left internal carotid artery that could potentially have caused his symptoms.  Prior to the event he was not on aspirin and he was started on aspirin.  Normally I felt that I would consider cerebral arteriography however the patient had chronic kidney disease.  Given that by duplex the stenosis did not produce a significant stenosis I elected to follow this and schedule him for a six-month follow-up visit.  He comes in today for a follow-up visit.  Since I saw him last, he denies any history of stroke, TIAs, or amaurosis fugax.  He states that he occasionally has problems finding his words although I do not get a clear-cut history of expressive a aphasia.  He is on aspirin and is on a statin.  He is not a smoker.  Past Medical History:  Diagnosis Date  . DIABETES MELLITUS, TYPE II 01/02/2007  . HYPERLIPIDEMIA 06/18/2007  . HYPERTENSION 01/02/2007  . RENAL INSUFFICIENCY 02/05/2008  . Stroke [facility])     Family History  Problem Relation Age of Onset  . Hypertension Mother   . Diabetes Mother   . Stroke Father   . Stroke Maternal Grandmother     SOCIAL HISTORY: Social History   Tobacco Use  . Smoking status: Never Smoker  . Smokeless tobacco: Never Used  Substance Use Topics  . Alcohol use: No    Allergies  Allergen Reactions  . Repatha  [Evolocumab] Hypertension  . Bactrim [Sulfamethoxazole-Trimethoprim] Hives, Itching and Swelling  . Penicillins     Has patient had a PCN reaction causing immediate rash, facial/tongue/throat swelling, SOB or lightheadedness with hypotension: Unk Has patient had a PCN reaction causing severe rash involving mucus membranes or skin necrosis: Unk Has patient had a PCN reaction that required hospitalization: Unk Has patient had a PCN reaction occurring within the last 10 years: No If all of the above answers are "NO", then may proceed with Cephalosporin use.  . Statins     myalgia  . Sulfa Drugs Cross Reactors Hives, Itching and Swelling    Current Outpatient Medications  Medication Sig Dispense Refill  . aspirin 325 MG EC tablet Take 1 tablet (325 mg total) by mouth daily. 30 tablet 0  . benazepril (LOTENSIN) 40 MG tablet TAKE 1 TABLET(40 MG) BY MOUTH TWICE DAILY (Patient taking differently: Take 40 mg by mouth daily. ) 60 tablet 11  . calcitRIOL (ROCALTROL) 0.25 MCG capsule Take 1 capsule by mouth 3 (three) times daily.     Marland Kitchen ezetimibe (ZETIA) 10 MG tablet Take 1 tablet (10 mg total) by mouth daily. 90 tablet 1  . hydrALAZINE (APRESOLINE) 50 MG tablet TAKE 1 TABLET(50 MG) BY MOUTH THREE TIMES DAILY 270 tablet 2  . Insulin Glargine (LANTUS SOLOSTAR) 100 UNIT/ML Solostar Pen Inject 18 Units into  the skin daily at 10 pm. INJECT 20 UNITS SUBCUTANEOUSLY ONCE DAILY (Patient taking differently: Inject 12 Units into the skin at bedtime. ) 2 pen 6  . labetalol (NORMODYNE) 200 MG tablet TAKE 1 TABLET(200 MG) BY MOUTH THREE TIMES DAILY 90 tablet 2  . nateglinide (STARLIX) 60 MG tablet Take 1 tablet (60 mg total) by mouth 3 (three) times daily with meals. (Patient taking differently: Take 60 mg by mouth every evening. ) 90 tablet 0   No current facility-administered medications for this visit.     REVIEW OF SYSTEMS:  [X]  denotes positive finding, [ ]  denotes negative finding Cardiac  Comments:  Chest  pain or chest pressure:    Shortness of breath upon exertion:    Short of breath when lying flat:    Irregular heart rhythm:        Vascular    Pain in calf, thigh, or hip brought on by ambulation:    Pain in feet at night that wakes you up from your sleep:     Blood clot in your veins:    Leg swelling:         Pulmonary    Oxygen at home:    Productive cough:     Wheezing:         Neurologic    Sudden weakness in arms or legs:     Sudden numbness in arms or legs:     Sudden onset of difficulty speaking or slurred speech:    Temporary loss of vision in one eye:     Problems with dizziness:  x       Gastrointestinal    Blood in stool:     Vomited blood:         Genitourinary    Burning when urinating:     Blood in urine:        Psychiatric    Major depression:         Hematologic    Bleeding problems:    Problems with blood clotting too easily:        Skin    Rashes or ulcers:        Constitutional    Fever or chills:     PHYSICAL EXAM:   Vitals:   02/28/18 1254 02/28/18 1255  BP: 130/71 (!) 143/73  Pulse: (!) 54   Resp: 16   Temp: 97.9 F (36.6 C)   SpO2: 99%   Weight: 218 lb (98.9 kg)   Height: 6\' 2"  (1.88 m)     GENERAL: The patient is a well-nourished male, in no acute distress. The vital signs are documented above. CARDIAC: There is a regular rate and rhythm.  VASCULAR: I do not detect carotid bruits. He has palpable femoral and popliteal pulses bilaterally.  Both feet are warm and well-perfused. He does have some hyperpigmentation bilaterally consistent with chronic venous insufficiency. PULMONARY: There is good air exchange bilaterally without wheezing or rales. ABDOMEN: Soft and non-tender with normal pitched bowel sounds.  MUSCULOSKELETAL: There are no major deformities or cyanosis. NEUROLOGIC: No focal weakness or paresthesias are detected. SKIN: There are no ulcers or rashes noted. PSYCHIATRIC: The patient has a normal affect.  DATA:     CAROTID DUPLEX: I have benefit of interpreted his carotid duplex scan today.  There is a less than 39% carotid stenosis bilaterally.  Both vertebral arteries are patent with antegrade flow.  MEDICAL ISSUES:   MILD LEFT INTERNAL CAROTID ARTERY STENOSIS: The patient had a mild  plaque in the left internal carotid artery on his CT angiogram.  However he does not have significantly elevated velocities by duplex.  He remains asymptomatic since his initial event.  He is on aspirin and a statin.  I have ordered a follow-up duplex scan in 1 year and I will see him back at that time.  He knows to call sooner if he has problems.  Fortunately, he is not a smoker.  We have also discussed the importance of exercise nutrition.  Deitra Mayo [facility] [phone]

## 2018-03-06 ENCOUNTER — Other Ambulatory Visit: Payer: Self-pay | Admitting: Adult Health

## 2018-03-08 LAB — CUP PACEART REMOTE DEVICE CHECK
Date Time Interrogation Session: 20190722163752
MDC IDC PG IMPLANT DT: 20190108

## 2018-03-09 ENCOUNTER — Ambulatory Visit: Payer: Medicare Other | Admitting: Physical Medicine & Rehabilitation

## 2018-03-15 ENCOUNTER — Encounter: Payer: Self-pay | Admitting: Physical Medicine & Rehabilitation

## 2018-03-15 ENCOUNTER — Encounter: Payer: Medicare Other | Attending: Physical Medicine & Rehabilitation | Admitting: Physical Medicine & Rehabilitation

## 2018-03-15 ENCOUNTER — Other Ambulatory Visit: Payer: Self-pay

## 2018-03-15 ENCOUNTER — Ambulatory Visit: Payer: Medicare Other | Admitting: Physical Medicine & Rehabilitation

## 2018-03-15 VITALS — BP 165/73 | HR 56 | Ht 74.0 in | Wt 221.8 lb

## 2018-03-15 DIAGNOSIS — I10 Essential (primary) hypertension: Secondary | ICD-10-CM | POA: Diagnosis not present

## 2018-03-15 DIAGNOSIS — I6930 Unspecified sequelae of cerebral infarction: Secondary | ICD-10-CM | POA: Diagnosis not present

## 2018-03-15 DIAGNOSIS — I129 Hypertensive chronic kidney disease with stage 1 through stage 4 chronic kidney disease, or unspecified chronic kidney disease: Secondary | ICD-10-CM | POA: Insufficient documentation

## 2018-03-15 DIAGNOSIS — Z8249 Family history of ischemic heart disease and other diseases of the circulatory system: Secondary | ICD-10-CM | POA: Diagnosis not present

## 2018-03-15 DIAGNOSIS — E1122 Type 2 diabetes mellitus with diabetic chronic kidney disease: Secondary | ICD-10-CM | POA: Insufficient documentation

## 2018-03-15 DIAGNOSIS — G8929 Other chronic pain: Secondary | ICD-10-CM

## 2018-03-15 DIAGNOSIS — Z823 Family history of stroke: Secondary | ICD-10-CM | POA: Diagnosis not present

## 2018-03-15 DIAGNOSIS — Z833 Family history of diabetes mellitus: Secondary | ICD-10-CM | POA: Diagnosis not present

## 2018-03-15 DIAGNOSIS — Z794 Long term (current) use of insulin: Secondary | ICD-10-CM

## 2018-03-15 DIAGNOSIS — I69351 Hemiplegia and hemiparesis following cerebral infarction affecting right dominant side: Secondary | ICD-10-CM | POA: Insufficient documentation

## 2018-03-15 DIAGNOSIS — I63512 Cerebral infarction due to unspecified occlusion or stenosis of left middle cerebral artery: Secondary | ICD-10-CM | POA: Diagnosis present

## 2018-03-15 DIAGNOSIS — Z9889 Other specified postprocedural states: Secondary | ICD-10-CM | POA: Diagnosis not present

## 2018-03-15 DIAGNOSIS — I6522 Occlusion and stenosis of left carotid artery: Secondary | ICD-10-CM | POA: Diagnosis not present

## 2018-03-15 DIAGNOSIS — N183 Chronic kidney disease, stage 3 (moderate): Secondary | ICD-10-CM | POA: Insufficient documentation

## 2018-03-15 DIAGNOSIS — E785 Hyperlipidemia, unspecified: Secondary | ICD-10-CM | POA: Diagnosis not present

## 2018-03-15 DIAGNOSIS — R4701 Aphasia: Secondary | ICD-10-CM | POA: Diagnosis not present

## 2018-03-15 DIAGNOSIS — M545 Low back pain: Secondary | ICD-10-CM

## 2018-03-15 MED ORDER — METHOCARBAMOL 500 MG PO TABS: 500.0000 mg | ORAL_TABLET | Freq: Two times a day (BID) | ORAL | 1 refills | Status: DC | PRN

## 2018-03-15 NOTE — Progress Notes (Signed)
Subjective:    Patient ID: [name] [name], male    DOB: Jul 23, 1945, 72 y.o.   MRN: [id number]  HPI 72 y.o. male with history of HTN, T2DM presents for follow up for left MCA infarct.  Last clinic visit 12/07/17.  Wife provides much of history. Since that time, he continues HEP. He is following up with Neurology. BP is elevated today, but wife states WNL at home.  He saw Vascular, no plans for intervention at this time. CBGs have been controlled. Denies falls. He has had some discomfort and rash after new statin therapy started.   Pain Inventory Average Pain 3 Pain Right Now 3 My pain is intermittent and aching  In the last 24 hours, has pain interfered with the following? General activity 4 Relation with others 2 Enjoyment of life 5 What TIME of day is your pain at its worst? daytime Sleep (in general) Good  Pain is worse with: bending, inactivity, standing, unsure and some activites Pain improves with: rest Relief from Meds: 0  Mobility walk without assistance how many minutes can you walk? 60 ability to climb steps?  yes do you drive?  yes Do you have any goals in this area?  yes  Function retired Do you have any goals in this area?  yes  Neuro/Psych weakness tremor tingling dizziness  Prior Studies Any changes since last visit?  no  Physicians involved in your care Any changes since last visit?  no   Family History  Problem Relation Age of Onset  . Hypertension Mother   . Diabetes Mother   . Stroke Father   . Stroke Maternal Grandmother    Social History   Socioeconomic History  . Marital status: Married    Spouse name: Not on file  . Number of children: Not on file  . Years of education: Not on file  . Highest education level: Not on file  Occupational History  . Not on file  Social Needs  . Financial resource strain: Not on file  . Food insecurity:    Worry: Not on file    Inability: Not on file  . Transportation needs:    Medical: Not on  file    Non-medical: Not on file  Tobacco Use  . Smoking status: Never Smoker  . Smokeless tobacco: Never Used  Substance and Sexual Activity  . Alcohol use: No  . Drug use: No  . Sexual activity: Not on file  Lifestyle  . Physical activity:    Days per week: Not on file    Minutes per session: Not on file  . Stress: Not on file  Relationships  . Social connections:    Talks on phone: Not on file    Gets together: Not on file    Attends religious service: Not on file    Active member of club or organization: Not on file    Attends meetings of clubs or organizations: Not on file    Relationship status: Not on file  Other Topics Concern  . 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 Likes to go to football games.    Past Surgical History:  Procedure Laterality Date  . LOOP RECORDER INSERTION N/A 07/11/2017   Procedure: LOOP RECORDER INSERTION;  Surgeon: Thompson Grayer, MD;  Location: [place] [facility];  Service: Cardiovascular;  Laterality: N/A;  . SHOULDER SURGERY     left  . TEE WITHOUT CARDIOVERSION N/A 07/11/2017   Procedure: TRANSESOPHAGEAL ECHOCARDIOGRAM (TEE);  Surgeon: Dorothy Spark, MD;  Location: [facility] ENDOSCOPY;  Service: Cardiovascular;  Laterality: N/A;  . TRANSURETHRAL RESECTION OF PROSTATE     history of retention/hematuria    Past Medical History:  Diagnosis Date  . DIABETES MELLITUS, TYPE II 01/02/2007  . HYPERLIPIDEMIA 06/18/2007  . HYPERTENSION 01/02/2007  . RENAL INSUFFICIENCY 02/05/2008  . Stroke ([place])    BP (!) 165/73   Pulse (!) 56   Ht 6\' 2"  (1.88 m)   Wt 221 lb 12.8 oz (100.6 kg)   SpO2 98%   BMI 28.48 kg/m   Opioid Risk Score:   Fall Risk Score:  `1  Depression screen PHQ 2/9  Depression screen [facility] 2/9 03/15/2018 07/27/2017 04/08/2015 04/02/2013  Decreased Interest 0 3 0 0  Down, Depressed,  Hopeless 0 1 0 0  PHQ - 2 Score 0 4 0 0  Altered sleeping - 0 - -  Tired, decreased energy - 1 - -  Change in appetite - 0 - -  Feeling bad or failure about yourself  - 1 - -  Trouble concentrating - 1 - -  Moving slowly or fidgety/restless - 1 - -  Suicidal thoughts - 0 - -  PHQ-9 Score - 8 - -  Difficult doing work/chores - Somewhat difficult - -     Review of Systems  Constitutional: Positive for diaphoresis and unexpected weight change.  HENT: Negative.   Eyes: Negative.   Respiratory: Positive for cough.   Cardiovascular: Negative.   Gastrointestinal: Negative.   Endocrine:       High/low sugar   Genitourinary: Negative.   Musculoskeletal: Positive for gait problem.       Spasms   Skin: Positive for rash.  Allergic/Immunologic: Negative.   Neurological: Positive for dizziness, tremors, weakness and numbness.  Hematological: Negative.   Psychiatric/Behavioral: Negative.   All other systems reviewed and are negative.      Objective:   Physical Exam Constitutional: He appears well-developed and well-nourished.  HENT: Normocephalic and atraumatic.  Eyes: EOM are normal. No discharge.  Cardiovascular: RRR. No JVD. Respiratory: Effort normal and breath sounds normal.  GI: Bowel sounds are normal. There is no tenderness.  Musculoskeletal: He exhibits no edema, no tenderness. Protruding lumbar spinous processed  Gait: Slow cadence Neurological: He is alert and oriented.  Motor: RUE: 5/5 prox to distal.  RLE: 5/5 proximal to distal Skin: Skin is warm and dry.  Psychiatric: Flat.    Assessment & Plan:  72 y.o. male with history of HTN, T2DM presents for follow up for left MCA infarct.  1. Right hemparesis and aphasia secondary to left MCA infarct  Cont HEP  Cont follow up with Neurology  Fatigue now, thought to be secondary to new statin therapy  2. HTN  Elevated in office, however, WNL at home.   3. Left ICA plaque:    Saw Vascular, no plans for  intervention at this time, stable  Follow up in 1 year.  4. DM   Improved with new medication  5. Dysarthria   Improving  6. Back discomfort  Acute on chronic   Wife/pt state after statin therapy  Will order Robaxin 500 BID PRN  Cont heating pad  Will order xray

## 2018-03-21 ENCOUNTER — Ambulatory Visit: Payer: Medicare Other | Admitting: Adult Health

## 2018-03-21 ENCOUNTER — Encounter: Payer: Self-pay | Admitting: Adult Health

## 2018-03-21 VITALS — BP 140/70 | Temp 98.5°F | Wt 221.0 lb

## 2018-03-21 DIAGNOSIS — I10 Essential (primary) hypertension: Secondary | ICD-10-CM

## 2018-03-21 DIAGNOSIS — N183 Chronic kidney disease, stage 3 (moderate): Secondary | ICD-10-CM | POA: Diagnosis not present

## 2018-03-21 DIAGNOSIS — E1122 Type 2 diabetes mellitus with diabetic chronic kidney disease: Secondary | ICD-10-CM | POA: Diagnosis not present

## 2018-03-21 DIAGNOSIS — Z794 Long term (current) use of insulin: Secondary | ICD-10-CM | POA: Diagnosis not present

## 2018-03-21 DIAGNOSIS — E782 Mixed hyperlipidemia: Secondary | ICD-10-CM

## 2018-03-21 LAB — POCT GLYCOSYLATED HEMOGLOBIN (HGB A1C): HbA1c, POC (controlled diabetic range): 5.5 % (ref 0.0–7.0)

## 2018-03-21 NOTE — Progress Notes (Signed)
Subjective:    Patient ID: [name] [name], male    DOB: [birth year], 72 y.o.   MRN: [id number]  HPI 72 year old male who  has a past medical history of DIABETES MELLITUS, TYPE II (01/02/2007), HYPERLIPIDEMIA (06/18/2007), HYPERTENSION (01/02/2007), RENAL INSUFFICIENCY (02/05/2008), and Stroke ([place]). He presents to the office today for three month follow up regarding DM and HTN.   DM - He is currently maintained on Starlix 60 mg daily and Lantus 12 units nightly.  During the last visit he was complaining of episodic hypoglycemia events, Lantus was decreased from 15 units nightly to 12 units nightly.  He has been monitoring his blood sugars on a regular basis and has readings between 67 and 110 fasting.  He continues to work on diet and has started exercising again status post CVA, he reports he is walking about 3.5 miles every other day. He does complain of fatigue. Has intermittent episodes of hypoglycemia  Lab Results  Component Value Date   HGBA1C 5.7 01/10/2018    HTN - He is currently prescribed benazepril 40 mg daily hydralazine 50 mg 3 times daily, and labetalol 200 mg 3 times daily.  Started following with a new nephrologist that he enjoys. Reports BP readings at home in the 120/130-70-80's consistently.  BP Readings from Last 3 Encounters:  03/21/18 140/70  03/15/18 (!) 165/73  02/28/18 (!) 143/73   He will be getting an xray tomorrow for low back pain. This was ordered by Dr. Posey Pronto.   Review of Systems See HPI   Past Medical History:  Diagnosis Date  . DIABETES MELLITUS, TYPE II 01/02/2007  . HYPERLIPIDEMIA 06/18/2007  . HYPERTENSION 01/02/2007  . RENAL INSUFFICIENCY 02/05/2008  . Stroke [facility])     Social History   Socioeconomic History  . Marital status: Married    Spouse name: Not on file  . Number of children: Not on file  . Years of education: Not on file  . Highest education level: Not on file  Occupational History  . Not on file  Social Needs  . Financial resource  strain: Not on file  . Food insecurity:    Worry: Not on file    Inability: Not on file  . Transportation needs:    Medical: Not on file    Non-medical: Not on file  Tobacco Use  . Smoking status: Never Smoker  . Smokeless tobacco: Never Used  Substance and Sexual Activity  . Alcohol use: No  . Drug use: No  . Sexual activity: Not on file  Lifestyle  . Physical activity:    Days per week: Not on file    Minutes per session: Not on file  . Stress: Not on file  Relationships  . Social connections:    Talks on phone: Not on file    Gets together: Not on file    Attends religious service: Not on file    Active member of club or organization: Not on file    Attends meetings of clubs or organizations: Not on file    Relationship status: Not on file  . Intimate partner violence:    Fear of current or ex partner: Not on file    Emotionally abused: Not on file    Physically abused: Not on file    Forced sexual activity: Not on file  Other Topics Concern  . 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 Likes to go to football games.     Past Surgical History:  Procedure Laterality Date  . LOOP RECORDER INSERTION N/A 07/11/2017   Procedure: LOOP RECORDER INSERTION;  Surgeon: Thompson Grayer, MD;  Location: [place] [facility];  Service: Cardiovascular;  Laterality: N/A;  . SHOULDER SURGERY     left  . TEE WITHOUT CARDIOVERSION N/A 07/11/2017   Procedure: TRANSESOPHAGEAL ECHOCARDIOGRAM (TEE);  Surgeon: Dorothy Spark, MD;  Location: [facility] ENDOSCOPY;  Service: Cardiovascular;  Laterality: N/A;  . TRANSURETHRAL RESECTION OF PROSTATE     history of retention/hematuria     Family History  Problem Relation Age of Onset  . Hypertension Mother   . Diabetes Mother   . Stroke Father   . Stroke Maternal Grandmother     Allergies  Allergen  Reactions  . Repatha [Evolocumab] Hypertension  . Bactrim [Sulfamethoxazole-Trimethoprim] Hives, Itching and Swelling  . Penicillins     Has patient had a PCN reaction causing immediate rash, facial/tongue/throat swelling, SOB or lightheadedness with hypotension: Unk Has patient had a PCN reaction causing severe rash involving mucus membranes or skin necrosis: Unk Has patient had a PCN reaction that required hospitalization: Unk Has patient had a PCN reaction occurring within the last 10 years: No If all of the above answers are "NO", then may proceed with Cephalosporin use.  . Statins     myalgia  . Sulfa Drugs Cross Reactors Hives, Itching and Swelling    Current Outpatient Medications on File Prior to Visit  Medication Sig Dispense Refill  . aspirin 325 MG EC tablet Take 1 tablet (325 mg total) by mouth daily. 30 tablet 0  . benazepril (LOTENSIN) 40 MG tablet TAKE 1 TABLET(40 MG) BY MOUTH TWICE DAILY (Patient taking differently: Take 40 mg by mouth daily. ) 60 tablet 11  . calcitRIOL (ROCALTROL) 0.25 MCG capsule Take 1 capsule by mouth 3 (three) times daily.     Marland Kitchen ezetimibe (ZETIA) 10 MG tablet TAKE 1 TABLET(10 MG) BY MOUTH DAILY 90 tablet 0  . hydrALAZINE (APRESOLINE) 50 MG tablet TAKE 1 TABLET(50 MG) BY MOUTH THREE TIMES DAILY 270 tablet 2  . Insulin Glargine (LANTUS SOLOSTAR) 100 UNIT/ML Solostar Pen Inject 18 Units into the skin daily at 10 pm. INJECT 20 UNITS SUBCUTANEOUSLY ONCE DAILY (Patient taking differently: Inject 12 Units into the skin at bedtime. ) 2 pen 6  . labetalol (NORMODYNE) 200 MG tablet TAKE 1 TABLET(200 MG) BY MOUTH THREE TIMES DAILY 90 tablet 2  . methocarbamol (ROBAXIN) 500 MG tablet Take 1 tablet (500 mg total) by mouth 2 (two) times daily as needed for muscle spasms. 60 tablet 1  . nateglinide (STARLIX) 60 MG tablet Take 1 tablet (60 mg total) by mouth 3 (three) times daily with meals. (Patient taking differently: Take 60 mg by mouth every evening. ) 90 tablet 0     No current facility-administered medications on file prior to visit.     There were no vitals taken for this visit.      Objective:   Physical Exam  Constitutional: He is oriented to person, place, and time. He appears well-developed and well-nourished. No distress.  HENT:  Head: Normocephalic and atraumatic.  Right Ear: External ear normal.  Left Ear: External ear normal.  Nose: Nose normal.  Mouth/Throat: Oropharynx is clear and moist. No oropharyngeal exudate.  Eyes: No scleral icterus.  Cardiovascular: Normal rate, regular rhythm, normal heart sounds and intact distal pulses.  Pulmonary/Chest: Effort normal and breath sounds normal.  Neurological: He is alert and oriented to person, place, and time.  Skin: Skin is warm and dry. He is not diaphoretic.  Psychiatric: He has a normal mood and affect. His behavior is normal. Judgment and thought content normal.  Nursing note and vitals reviewed.     Assessment & Plan:  1. Type 2 diabetes mellitus with stage 3 chronic kidney disease, with long-term current use of insulin (HCC) - POC HgB A1c- 5.5  - Will decrease Lantus from 12 units to 8 units, hopefully this will help with fatigue aspect and decrease number of hypoglycemic events. - Follow up in 3 months or sooner if needed   2. Uncontrolled hypertension - BP better controlled at home  - No changes in medication   Dorothyann Peng, NP

## 2018-03-22 ENCOUNTER — Ambulatory Visit (HOSPITAL_COMMUNITY)
Admission: RE | Admit: 2018-03-22 | Discharge: 2018-03-22 | Disposition: A | Discharge status: Home / Self Care | Payer: Medicare Other | Source: Ambulatory Visit | Attending: Physical Medicine & Rehabilitation | Admitting: Physical Medicine & Rehabilitation

## 2018-03-22 DIAGNOSIS — G8929 Other chronic pain: Secondary | ICD-10-CM | POA: Diagnosis not present

## 2018-03-22 DIAGNOSIS — M545 Low back pain: Secondary | ICD-10-CM | POA: Diagnosis not present

## 2018-03-22 DIAGNOSIS — M47816 Spondylosis without myelopathy or radiculopathy, lumbar region: Secondary | ICD-10-CM | POA: Diagnosis not present

## 2018-03-27 LAB — CUP PACEART REMOTE DEVICE CHECK
Date Time Interrogation Session: 20190824174159
MDC IDC PG IMPLANT DT: 20190108

## 2018-03-29 ENCOUNTER — Ambulatory Visit (INDEPENDENT_AMBULATORY_CARE_PROVIDER_SITE_OTHER): Payer: Medicare Other | Admitting: *Deleted

## 2018-03-29 DIAGNOSIS — I639 Cerebral infarction, unspecified: Secondary | ICD-10-CM | POA: Diagnosis not present

## 2018-03-30 NOTE — Progress Notes (Signed)
[facility] Summary Report / Loop Recorder 

## 2018-04-02 LAB — CUP PACEART REMOTE DEVICE CHECK
Date Time Interrogation Session: 20190926184108
Implantable Pulse Generator Implant Date: 20190108

## 2018-04-03 ENCOUNTER — Telehealth: Payer: Self-pay | Admitting: Adult Health

## 2018-04-03 NOTE — Telephone Encounter (Signed)
° ° °  Pt will contact nephrology who RX the med

## 2018-04-03 NOTE — Telephone Encounter (Signed)
Copied from [place] [phone]. Topic: Quick Communication - Rx Refill/Question >> Apr 03, 2018  3:53 PM Reyne Dumas L wrote: Medication: nateglinide (STARLIX) 60 MG tablet  Has the patient contacted their pharmacy? Yes - states they haven't heard from [place].  States pt is completely out of medication (Agent: If no, request that the patient contact the pharmacy for the refill.) (Agent: If yes, when and what did the pharmacy advise?)  Preferred Pharmacy (with phone number or street name): [facility] [place], [address] AT [address] [phone] (Phone) [phone] (Fax)  Agent: Please be advised that RX refills may take up to 3 business days. We ask that you follow-up with your pharmacy.

## 2018-04-05 ENCOUNTER — Other Ambulatory Visit: Payer: Self-pay | Admitting: Internal Medicine

## 2018-04-06 ENCOUNTER — Other Ambulatory Visit: Payer: Self-pay

## 2018-04-06 ENCOUNTER — Encounter: Payer: Medicare Other | Attending: Physical Medicine & Rehabilitation | Admitting: Physical Medicine & Rehabilitation

## 2018-04-06 ENCOUNTER — Encounter: Payer: Self-pay | Admitting: Physical Medicine & Rehabilitation

## 2018-04-06 VITALS — BP 156/72 | HR 59 | Ht 74.0 in | Wt 221.8 lb

## 2018-04-06 DIAGNOSIS — Z8249 Family history of ischemic heart disease and other diseases of the circulatory system: Secondary | ICD-10-CM | POA: Diagnosis not present

## 2018-04-06 DIAGNOSIS — I6522 Occlusion and stenosis of left carotid artery: Secondary | ICD-10-CM | POA: Diagnosis not present

## 2018-04-06 DIAGNOSIS — I129 Hypertensive chronic kidney disease with stage 1 through stage 4 chronic kidney disease, or unspecified chronic kidney disease: Secondary | ICD-10-CM | POA: Diagnosis not present

## 2018-04-06 DIAGNOSIS — Z823 Family history of stroke: Secondary | ICD-10-CM | POA: Insufficient documentation

## 2018-04-06 DIAGNOSIS — E1122 Type 2 diabetes mellitus with diabetic chronic kidney disease: Secondary | ICD-10-CM | POA: Diagnosis not present

## 2018-04-06 DIAGNOSIS — I10 Essential (primary) hypertension: Secondary | ICD-10-CM

## 2018-04-06 DIAGNOSIS — I6930 Unspecified sequelae of cerebral infarction: Secondary | ICD-10-CM | POA: Diagnosis not present

## 2018-04-06 DIAGNOSIS — I63512 Cerebral infarction due to unspecified occlusion or stenosis of left middle cerebral artery: Secondary | ICD-10-CM | POA: Diagnosis present

## 2018-04-06 DIAGNOSIS — Z833 Family history of diabetes mellitus: Secondary | ICD-10-CM | POA: Diagnosis not present

## 2018-04-06 DIAGNOSIS — E785 Hyperlipidemia, unspecified: Secondary | ICD-10-CM | POA: Diagnosis not present

## 2018-04-06 DIAGNOSIS — I69322 Dysarthria following cerebral infarction: Secondary | ICD-10-CM

## 2018-04-06 DIAGNOSIS — Z9889 Other specified postprocedural states: Secondary | ICD-10-CM | POA: Diagnosis not present

## 2018-04-06 DIAGNOSIS — N183 Chronic kidney disease, stage 3 (moderate): Secondary | ICD-10-CM | POA: Diagnosis not present

## 2018-04-06 DIAGNOSIS — R4701 Aphasia: Secondary | ICD-10-CM | POA: Insufficient documentation

## 2018-04-06 DIAGNOSIS — I69351 Hemiplegia and hemiparesis following cerebral infarction affecting right dominant side: Secondary | ICD-10-CM | POA: Insufficient documentation

## 2018-04-06 DIAGNOSIS — R5383 Other fatigue: Secondary | ICD-10-CM

## 2018-04-06 NOTE — Progress Notes (Signed)
Subjective:    Patient ID: [name] [name], male    DOB: [birth year], 72 y.o.   MRN: [id number]  HPI 72 y.o. male with history of HTN, T2DM presents for follow up for left MCA infarct.  Last clinic visit 03/15/18.  Wife provides much of history. Since that time, BP has improved. He notes improvement in fatigue. BP elevated in office, however, usually controlled at home, per wife. CBGs remain controlled.  He did not try Robaxin because he has little minimal discomfort intermittently, usually while doing activities involving prolonged bending.  He did obtain an xray.   Pain Inventory Average Pain 3 Pain Right Now 2 My pain is intermittent and dull  In the last 24 hours, has pain interfered with the following? General activity 0 Relation with others 0 Enjoyment of life 2 What TIME of day is your pain at its worst? morning Sleep (in general) Good  Pain is worse with: bending, standing and some activites Pain improves with: rest, heat/ice and therapy/exercise Relief from Meds: 0  Mobility walk without assistance how many minutes can you walk? 60 ability to climb steps?  yes do you drive?  yes Do you have any goals in this area?  yes  Function retired Do you have any goals in this area?  yes  Neuro/Psych weakness tremor tingling dizziness  Prior Studies Any changes since last visit?  no  Physicians involved in your care Any changes since last visit?  no   Family History  Problem Relation Age of Onset  . Hypertension Mother   . Diabetes Mother   . Stroke Father   . Stroke Maternal Grandmother    Social History   Socioeconomic History  . Marital status: Married    Spouse name: Not on file  . Number of children: Not on file  . Years of education: Not on file  . Highest education level: Not on file  Occupational History  . Not on file  Social Needs  . Financial resource strain: Not on file  . Food insecurity:    Worry: Not on file    Inability: Not on file   . Transportation needs:    Medical: Not on file    Non-medical: Not on file  Tobacco Use  . Smoking status: Never Smoker  . Smokeless tobacco: Never Used  Substance and Sexual Activity  . Alcohol use: No  . Drug use: No  . Sexual activity: Not on file  Lifestyle  . Physical activity:    Days per week: Not on file    Minutes per session: Not on file  . Stress: Not on file  Relationships  . Social connections:    Talks on phone: Not on file    Gets together: Not on file    Attends religious service: Not on file    Active member of club or organization: Not on file    Attends meetings of clubs or organizations: Not on file    Relationship status: Not on file  Other Topics Concern  . 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 Likes to go to football games.    Past Surgical History:  Procedure Laterality Date  . LOOP RECORDER INSERTION N/A 07/11/2017   Procedure: LOOP RECORDER INSERTION;  Surgeon: Thompson Grayer, MD;  Location: [facility];  Service: Cardiovascular;  Laterality: N/A;  . SHOULDER SURGERY     left  . TEE WITHOUT CARDIOVERSION N/A 07/11/2017   Procedure: TRANSESOPHAGEAL ECHOCARDIOGRAM (TEE);  Surgeon: Dorothy Spark, MD;  Location: [facility];  Service: Cardiovascular;  Laterality: N/A;  . TRANSURETHRAL RESECTION OF PROSTATE     history of retention/hematuria    Past Medical History:  Diagnosis Date  . DIABETES MELLITUS, TYPE II 01/02/2007  . HYPERLIPIDEMIA 06/18/2007  . HYPERTENSION 01/02/2007  . RENAL INSUFFICIENCY 02/05/2008  . Stroke ([place])    BP (!) 156/72   Pulse (!) 59   Ht 6\' 2"  (1.88 m)   Wt 221 lb 12.8 oz (100.6 kg)   SpO2 98%   BMI 28.48 kg/m   Opioid Risk Score:   Fall Risk Score:  `1  Depression screen PHQ 2/9  Depression screen [facility] 2/9 04/06/2018 03/15/2018 07/27/2017 04/08/2015  04/02/2013  Decreased Interest 0 0 3 0 0  Down, Depressed, Hopeless 0 0 1 0 0  PHQ - 2 Score 0 0 4 0 0  Altered sleeping - - 0 - -  Tired, decreased energy - - 1 - -  Change in appetite - - 0 - -  Feeling bad or failure about yourself  - - 1 - -  Trouble concentrating - - 1 - -  Moving slowly or fidgety/restless - - 1 - -  Suicidal thoughts - - 0 - -  PHQ-9 Score - - 8 - -  Difficult doing work/chores - - Somewhat difficult - -     Review of Systems  Constitutional: Positive for diaphoresis and unexpected weight change.  HENT: Negative.   Eyes: Negative.   Respiratory: Positive for cough.   Cardiovascular: Negative.   Gastrointestinal: Negative.   Endocrine:       High/low sugar   Genitourinary: Negative.   Musculoskeletal: Positive for gait problem.       Spasms   Skin: Positive for rash.  Allergic/Immunologic: Negative.   Neurological: Positive for dizziness, tremors, weakness and numbness.  Hematological: Negative.   Psychiatric/Behavioral: Negative.   All other systems reviewed and are negative.      Objective:   Physical Exam Constitutional: He appears well-developed and well-nourished.  HENT: Normocephalic and atraumatic.  Eyes: EOM are normal. No discharge.  Cardiovascular: RRR. No JVD. Respiratory: Effort normal and breath sounds normal.  GI: Bowel sounds are normal. There is no tenderness.  Musculoskeletal: He exhibits no edema, no tenderness. Protruding lumbar spinous processed  Gait: Slow cadence Neurological: He is alert and oriented.  Motor: RUE: 5/5 prox to distal.  RLE: 5/5 proximal to distal Skin: Skin is warm and dry.  Psychiatric: Flat.    Assessment & Plan:  72 y.o. male with history of HTN, T2DM presents for follow up for left MCA infarct.  1. Right hemparesis and aphasia secondary to left MCA infarct  Cont HEP  Cont follow up with Neurology  Fatigue now, thought to be secondary to new statin therapy - does not want medications  2.  HTN  Elevated in office, however, WNL at home per wife.   3. Left ICA plaque:    Saw Vascular, no plans for intervention at this time, stable  Follow up in 1 year - ~03/2019.  4. DM   Improved with new medication  5. Dysarthria   Improving  6. Back discomfort  Acute on chronic   Wife/pt state after statin therapy  Ordered Robaxin 500 BID PRN,  pt states discomfort has improved and has not needed to take medications  Cont heating pad  Vitamin D reviewed: WNL  Xray reviewed, showing facet arthropathy - pt prefers to proceed with joining wife with pool therapy

## 2018-04-18 ENCOUNTER — Other Ambulatory Visit: Payer: Self-pay | Admitting: *Deleted

## 2018-04-18 MED ORDER — LABETALOL HCL 200 MG PO TABS: ORAL_TABLET | ORAL | 2 refills | Status: DC

## 2018-04-20 ENCOUNTER — Telehealth: Payer: Self-pay | Admitting: Adult Health

## 2018-04-20 NOTE — Telephone Encounter (Signed)
Continue to monitor and if BP continues to be above 140 consistently then inform Nephrology

## 2018-04-20 NOTE — Telephone Encounter (Signed)
Copied from [place] [id number]. Topic: Quick Communication - See Telephone Encounter >> Apr 20, 2018  8:46 AM Burchel, Abbi R wrote: CRM for notification. See Telephone encounter for: 04/20/18.  Pt's wife requesting a call back about pt's recent BP and blood sugar levels.    [phone]

## 2018-04-20 NOTE — Telephone Encounter (Signed)
Spoke to Mrs. Prada and informed her to monitor and inform nephrology if needed.

## 2018-04-20 NOTE — Telephone Encounter (Signed)
Spoke with wife:  Glucose this AM: 130  BP daily x 1 week: 132/75, 147/83, 145/81, 144/76, 159/82.

## 2018-04-23 ENCOUNTER — Telehealth: Payer: Self-pay | Admitting: *Deleted

## 2018-04-23 NOTE — Telephone Encounter (Signed)
Spoke with patient's wife to request manual LINQ transmission for review. Gave instructions and advised I will check back later this week to ensure transmission was received. Patient's wife verbalizes agreement with plan.

## 2018-04-24 NOTE — Telephone Encounter (Signed)
Manual transmission received and reviewed. 3 "AF" episodes are false--ECGs show SR with undersensing.

## 2018-05-01 ENCOUNTER — Ambulatory Visit (INDEPENDENT_AMBULATORY_CARE_PROVIDER_SITE_OTHER): Payer: Medicare Other | Admitting: *Deleted

## 2018-05-01 DIAGNOSIS — I639 Cerebral infarction, unspecified: Secondary | ICD-10-CM

## 2018-05-02 NOTE — Progress Notes (Signed)
[facility] Summary Report / Loop Recorder 

## 2018-05-23 LAB — CUP PACEART REMOTE DEVICE CHECK
Implantable Pulse Generator Implant Date: 20190108
MDC IDC SESS DTM: 20191029183914

## 2018-06-04 ENCOUNTER — Ambulatory Visit (INDEPENDENT_AMBULATORY_CARE_PROVIDER_SITE_OTHER): Payer: Medicare Other

## 2018-06-04 ENCOUNTER — Other Ambulatory Visit: Payer: Self-pay | Admitting: Adult Health

## 2018-06-04 DIAGNOSIS — I639 Cerebral infarction, unspecified: Secondary | ICD-10-CM | POA: Diagnosis not present

## 2018-06-04 NOTE — Progress Notes (Signed)
[facility] Summary Report / Loop Recorder 

## 2018-06-05 NOTE — Telephone Encounter (Signed)
Need to confirm if the pt has started Repatha.

## 2018-06-06 NOTE — Telephone Encounter (Signed)
Pt stopped the Repatha.  Continues to take Zetia.  Last lipid was 07/2017.  Please advise.

## 2018-06-06 NOTE — Telephone Encounter (Signed)
Sent to the pharmacy by e-scribe. 

## 2018-06-06 NOTE — Telephone Encounter (Signed)
Ok to refill for one year. He has an upcoming appointment

## 2018-06-18 ENCOUNTER — Ambulatory Visit: Payer: Medicare Other | Admitting: Adult Health

## 2018-06-18 ENCOUNTER — Encounter: Payer: Self-pay | Admitting: Adult Health

## 2018-06-18 VITALS — BP 136/76 | HR 62 | Ht 74.0 in | Wt 225.4 lb

## 2018-06-18 DIAGNOSIS — E1122 Type 2 diabetes mellitus with diabetic chronic kidney disease: Secondary | ICD-10-CM | POA: Diagnosis not present

## 2018-06-18 DIAGNOSIS — E782 Mixed hyperlipidemia: Secondary | ICD-10-CM

## 2018-06-18 DIAGNOSIS — I10 Essential (primary) hypertension: Secondary | ICD-10-CM

## 2018-06-18 DIAGNOSIS — Z794 Long term (current) use of insulin: Secondary | ICD-10-CM

## 2018-06-18 DIAGNOSIS — I63512 Cerebral infarction due to unspecified occlusion or stenosis of left middle cerebral artery: Secondary | ICD-10-CM | POA: Diagnosis not present

## 2018-06-18 DIAGNOSIS — N183 Chronic kidney disease, stage 3 (moderate): Secondary | ICD-10-CM

## 2018-06-18 NOTE — Patient Instructions (Signed)
Continue aspirin 325 mg daily  and Zetia  for secondary stroke prevention  Continue to follow up with PCP regarding cholesterol, blood pressure and diabetes management   Continue to stay active and maintain a healthy diet  Continue to monitor blood pressure at home  Maintain strict control of hypertension with blood pressure goal below 130/90, diabetes with hemoglobin A1c goal below 6.5% and cholesterol with LDL cholesterol (bad cholesterol) goal below 70 mg/dL. I also advised the patient to eat a healthy diet with plenty of whole grains, cereals, fruits and vegetables, exercise regularly and maintain ideal body weight.  Followup in the future with me as needed or call earlier if needed       Thank you for coming to see [place] at [facility]. I hope we have been able to provide you high quality care today.  You may receive a patient satisfaction survey over the next few weeks. We would appreciate your feedback and comments so that we may continue to improve ourselves and the health of our patients.

## 2018-06-18 NOTE — Progress Notes (Signed)
[facility] [address] [place]. [place] [address] [phone]       OFFICE FOLLOW-UP NOTE  [name] [name] Date of Birth:  February 27, 1946 Medical Record Number:  [id number]   Chief Complaint  Patient presents with  . Follow-up    6 month follow up. Wife present. Rm 9. Patient mentioned that his blood sugars have gone up.      HPI:[name] [name] is a 72 year old pleasant African-American male seen today for the first office follow-up visit following hospital admission for stroke in January 2019. History is obtained from the patient, review of electronic medical records and I have personally reviewed imaging films.[name] [name] an 72 y.o.malemalewith medical history significant ofdiabetes mellitus type 2, CKD stage III, hypertension, hyperlipidemiapresented as stroke alert with aphasia. LSN was 2 am according to wife.  When he woke up at 8am,he was confused, speaking only few words. After few hours, EMS was alerted. BP was 814 systolic per EMS. On arrival patient had aphasia and mild right side weakness.Date last known well:1.5.19 Time last known well:2 am.tPA Given:no, outside window. NIHSS:4 Baseline MRS0. CT) showed no significant head large vessel Tetanus stenosis but a lipid-laden plaque in the proximal left ICA. CT perfusion showed no significant cord infarct. MRI scan of the brain showed multifocal areas of infarcts involving left MCA territory left frontal cortex and subcortical region. Transthoracic echo showed normal ejection fraction without cardiac source of embolism. Extracranial Dopplers were unremarkable. Transesophageal echo showed no Source of embolism. Patient had loop recorder inserted follow-up paroxysmal atrial fibrillation and so far it has not yet been found. LDL cholesterol was elevated at 212 mg percent but patient has a history of intolerance to statins and has tried Lipitor and simvastatin the past. He is currently on Zetia. He states  his done well since discharge is still having some occasional speech difficulties particularly when tired or tries to talk too fast. This is tolerating aspirin without bleeding or bruising. His fasting sugars are  pretty good and ranges in the low 100-110 range. His blood pressure normally at home is in the 130 to 140 range but today it is elevated in office at 170/92.   12/15/17 visit: Patient returns today for 36-month follow-up and is accompanied by his wife.  He has completed therapies but continues speech exercises at home such as reading, math word problems, reading comprehension exercises and coloring by numbers.  He continues to take aspirin without side effects of bleeding or bruising continues to take Zetia for cholesterol control.  Patient did take 2 doses of Repatha injections and had side effects such as back pain, overall discomfort, fatigue, blisters, joint discomfort and increased coughing, along with erratic BP and glucose numbers.  After speaking to [place] and PCP, it was advised to discontinue this medication and last injection was on 11/04/2017.  Since this time, BP and glucose numbers have been normalizing but can still be out of range at times.  Blood pressure today mildly elevated at 160/88 but per wife, BP this morning 139/77.  Patient questions whether he can begin driving at this time.  As no physical deficits were found, comprehension seem to be intact and no vision issues observed, it was recommended for wife to provide along with patient for the first few times, drive only places that he is familiar with and to avoid main roads or highways.  Patient is in agreement to this.  Denies new or worsening stroke/TIA symptoms.  06/18/2018 interval history: Patient  is being seen today for 79-month follow-up visit and is accompanied by his wife.  Overall, patient states he has been doing well with some speech difficulties but improvement since prior appointment. He continues to take aspirin  without side effects of bleeding or bruising.  Continues to take Zetia for HLD management.  Blood pressure today 194/84 initially but was rechecked for 136/76.  Blood pressure this morning at home was at 137/77. Per wife, BP fluctuates slightly at home and is managed by nephrologist. Loop recorder has not shown atrial fibrillation thus far.  No further concerns at this time and all questions answered during appointment.  Denies new or worsening stroke/TIA symptoms.    14 system review of systems is positive for activity change, appetite change, fatigue, cold intolerance, Raynaud's, joint pain, back pain and aching muscles and all the systems negative  PMH:  Past Medical History:  Diagnosis Date  . DIABETES MELLITUS, TYPE II 01/02/2007  . HYPERLIPIDEMIA 06/18/2007  . HYPERTENSION 01/02/2007  . RENAL INSUFFICIENCY 02/05/2008  . Stroke [facility])     Social History:  Social History   Socioeconomic History  . Marital status: Married    Spouse name: Not on file  . Number of children: Not on file  . Years of education: Not on file  . Highest education level: Not on file  Occupational History  . Not on file  Social Needs  . Financial resource strain: Not on file  . Food insecurity:    Worry: Not on file    Inability: Not on file  . Transportation needs:    Medical: Not on file    Non-medical: Not on file  Tobacco Use  . Smoking status: Never Smoker  . Smokeless tobacco: Never Used  Substance and Sexual Activity  . Alcohol use: No  . Drug use: No  . Sexual activity: Not on file  Lifestyle  . Physical activity:    Days per week: Not on file    Minutes per session: Not on file  . Stress: Not on file  Relationships  . Social connections:    Talks on phone: Not on file    Gets together: Not on file    Attends religious service: Not on file    Active member of club or organization: Not on file    Attends meetings of clubs or organizations: Not on file    Relationship status: Not on file   . Intimate partner violence:    Fear of current or ex partner: Not on file    Emotionally abused: Not on file    Physically abused: Not on file    Forced sexual activity: Not on file  Other Topics Concern  . 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 Likes to go to football games.     Medications:   Current Outpatient Medications on File Prior to Visit  Medication Sig Dispense Refill  . aspirin 325 MG EC tablet Take 1 tablet (325 mg total) by mouth daily. 30 tablet 0  . benazepril (LOTENSIN) 40 MG tablet Take 40 mg by mouth daily.    . calcitRIOL (ROCALTROL) 0.25 MCG capsule Take 1 capsule by mouth daily.     Marland Kitchen ezetimibe (ZETIA) 10 MG tablet TAKE 1 TABLET(10 MG) BY MOUTH DAILY 90 tablet  3  . hydrALAZINE (APRESOLINE) 50 MG tablet TAKE 1 TABLET(50 MG) BY MOUTH THREE TIMES DAILY 270 tablet 2  . insulin glargine (LANTUS) 100 UNIT/ML injection Inject 8 Units into the skin at bedtime.    Marland Kitchen labetalol (NORMODYNE) 200 MG tablet TAKE 1 TABLET(200 MG) BY MOUTH THREE TIMES DAILY 90 tablet 2  . nateglinide (STARLIX) 60 MG tablet Take 60 mg by mouth daily.     No current facility-administered medications on file prior to visit.     Allergies:   Allergies  Allergen Reactions  . Repatha [Evolocumab] Hypertension  . Bactrim [Sulfamethoxazole-Trimethoprim] Hives, Itching and Swelling  . Penicillins     Has patient had a PCN reaction causing immediate rash, facial/tongue/throat swelling, SOB or lightheadedness with hypotension: Unk Has patient had a PCN reaction causing severe rash involving mucus membranes or skin necrosis: Unk Has patient had a PCN reaction that required hospitalization: Unk Has patient had a PCN reaction occurring within the last 10 years: No If all of the above answers are "NO", then may proceed with Cephalosporin use.  .  Statins     myalgia  . Sulfa Drugs Cross Reactors Hives, Itching and Swelling    Physical Exam General: well developed, well nourished pleasant elderly African-American male, seated, in no evident distress Head: head normocephalic and atraumatic.  Neck: supple with no carotid or supraclavicular bruits Cardiovascular: regular rate and rhythm, no murmurs Musculoskeletal: no deformity Skin:  no rash/petichiae Vascular:  Normal pulses all extremities Vitals:   06/18/18 0941  BP: 136/76  Pulse: 62   Neurologic Exam Mental Status: Awake and fully alert.  Mild dysarthria present.  Oriented to place and time. Recent and remote memory intact. Attention span, concentration and fund of knowledge appropriate. Mood and affect appropriate. Cranial Nerves: Fundoscopic exam reveals sharp disc margins. Pupils equal, briskly reactive to light. Extraocular movements full without nystagmus. Visual fields full to confrontation. Hearing intact. Facial sensation intact. Face, tongue, palate moves normally and symmetrically.  Motor: Normal bulk and tone. Normal strength in all tested extremity muscles. Sensory.: intact to touch ,pinprick .position and vibratory sensation.  Coordination: Rapid alternating movements normal in all extremities. Finger-to-nose and heel-to-shin performed accurately bilaterally. Gait and Station: Arises from chair without difficulty. Stance is normal. Gait demonstrates normal stride length and balance . Able to heel, toe and tandem walk without difficulty.  Reflexes: 1+ and symmetric. Toes downgoing.     ASSESSMENT: 81 year African-American male with embolic left MCA branch infarct in January 2019 of cryptogenic etiology. Vascular risk factors of hypertension and hyperlipidemia.  Patient is being seen today for follow-up visit and overall has been recovering well with only mild intermittent dysarthria.    PLAN: -Continue aspirin 325 mg daily  and Zetia for secondary stroke  prevention -F/u with PCP regarding your HLD, HTN and DM management -Advised to continue home exercises -continue to monitor BP at home -Maintain strict control of hypertension with blood pressure goal below 130/90, diabetes with hemoglobin A1c goal below 6.5% and cholesterol with LDL cholesterol (bad cholesterol) goal below 70 mg/dL. I also advised the patient to eat a healthy diet with plenty of whole grains, cereals, fruits and vegetables, exercise regularly and maintain ideal body weight.  Follow up as needed as patient stable from stroke standpoint but advised to call with questions, concerns or need of future follow-up appointment  Greater than 50% of time during this 25 minute visit was spent on counseling,explanation of diagnosis of left MCA, reviewing risk factor  management of HLD and HTN, planning of further management, discussion with patient and family and coordination of care  Venancio Poisson, [facility]  [facility] [address] [employer] [place], [place] [address]  Phone [phone] Fax [phone]

## 2018-06-19 ENCOUNTER — Encounter: Payer: Self-pay | Admitting: Adult Health

## 2018-06-19 ENCOUNTER — Ambulatory Visit: Payer: Medicare Other | Admitting: Adult Health

## 2018-06-19 VITALS — BP 168/82 | Temp 98.4°F | Wt 220.0 lb

## 2018-06-19 DIAGNOSIS — Z23 Encounter for immunization: Secondary | ICD-10-CM

## 2018-06-19 DIAGNOSIS — E1169 Type 2 diabetes mellitus with other specified complication: Secondary | ICD-10-CM

## 2018-06-19 DIAGNOSIS — E669 Obesity, unspecified: Secondary | ICD-10-CM

## 2018-06-19 LAB — POCT GLYCOSYLATED HEMOGLOBIN (HGB A1C): HbA1c, POC (controlled diabetic range): 6.5 % (ref 0.0–7.0)

## 2018-06-19 NOTE — Addendum Note (Signed)
Addended by: Miles Costain T on: 06/19/2018 09:13 AM   Modules accepted: Orders

## 2018-06-19 NOTE — Progress Notes (Signed)
Subjective:    Patient ID: [name] [name], male    DOB: 03-12-1946, 72 y.o.   MRN: [id number]  HPI 72 year old male who  has a past medical history of DIABETES MELLITUS, TYPE II (01/02/2007), HYPERLIPIDEMIA (06/18/2007), HYPERTENSION (01/02/2007), RENAL INSUFFICIENCY (02/05/2008), and Stroke ([place]).  His wife is present today to help provide history   He is here for a chronic issue.   DM is currently maintained on Starlix 60 mg daily and Lantus 8 units. During his last visit Lantus was decreased from 12 units to 8 units,his A1c was 5.5. at this time he continued to complain of hypoglycemic events .  He monitors his BS at home and reports readings in the 100's-150's   He is feeling less fatigued and is not having episodes of hypoglycemia. He has not been walking due to weather but they will be going back to the gym after the holidays  Wt Readings from Last 3 Encounters:  06/19/18 220 lb (99.8 kg)  06/18/18 225 lb 6.4 oz (102.2 kg)  04/06/18 221 lb 12.8 oz (100.6 kg)    Review of Systems See HPI   Past Medical History:  Diagnosis Date  . DIABETES MELLITUS, TYPE II 01/02/2007  . HYPERLIPIDEMIA 06/18/2007  . HYPERTENSION 01/02/2007  . RENAL INSUFFICIENCY 02/05/2008  . Stroke [facility])     Social History   Socioeconomic History  . Marital status: Married    Spouse name: Not on file  . Number of children: Not on file  . Years of education: Not on file  . Highest education level: Not on file  Occupational History  . Not on file  Social Needs  . Financial resource strain: Not on file  . Food insecurity:    Worry: Not on file    Inability: Not on file  . Transportation needs:    Medical: Not on file    Non-medical: Not on file  Tobacco Use  . Smoking status: Never Smoker  . Smokeless tobacco: Never Used  Substance and Sexual Activity  . Alcohol use: [name]  . Drug use: [name]  . Sexual activity: Not on file  Lifestyle  . Physical activity:    Days per week: Not on file    Minutes per  session: Not on file  . Stress: Not on file  Relationships  . Social connections:    Talks on phone: Not on file    Gets together: Not on file    Attends religious service: Not on file    Active member of club or organization: Not on file    Attends meetings of clubs or organizations: Not on file    Relationship status: Not on file  . Intimate partner violence:    Fear of current or ex partner: Not on file    Emotionally abused: Not on file    Physically abused: Not on file    Forced sexual activity: Not on file  Other Topics Concern  . 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 Likes to go to football games.     Past Surgical History:  Procedure Laterality Date  . LOOP RECORDER INSERTION N/A 07/11/2017   Procedure: LOOP RECORDER INSERTION;  Surgeon: Thompson Grayer, MD;  Location: [facility];  Service: Cardiovascular;  Laterality:  N/A;  . SHOULDER SURGERY     left  . TEE WITHOUT CARDIOVERSION N/A 07/11/2017   Procedure: TRANSESOPHAGEAL ECHOCARDIOGRAM (TEE);  Surgeon: Dorothy Spark, MD;  Location: [facility] ENDOSCOPY;  Service: Cardiovascular;  Laterality: N/A;  . TRANSURETHRAL RESECTION OF PROSTATE     history of retention/hematuria     Family History  Problem Relation Age of Onset  . Hypertension Mother   . Diabetes Mother   . Stroke Father   . Stroke Maternal Grandmother     Allergies  Allergen Reactions  . Repatha [Evolocumab] Hypertension  . Bactrim [Sulfamethoxazole-Trimethoprim] Hives, Itching and Swelling  . Penicillins     Has patient had a PCN reaction causing immediate rash, facial/tongue/throat swelling, SOB or lightheadedness with hypotension: Unk Has patient had a PCN reaction causing severe rash involving mucus membranes or skin necrosis: Unk Has patient had a PCN reaction that required hospitalization:  Unk Has patient had a PCN reaction occurring within the last 10 years: [name] If all of the above answers are "[name]", then may proceed with Cephalosporin use.  . Statins     myalgia  . Sulfa Drugs Cross Reactors Hives, Itching and Swelling    Current Outpatient Medications on File Prior to Visit  Medication Sig Dispense Refill  . aspirin 325 MG EC tablet Take 1 tablet (325 mg total) by mouth daily. 30 tablet 0  . benazepril (LOTENSIN) 40 MG tablet Take 40 mg by mouth daily.    . calcitRIOL (ROCALTROL) 0.25 MCG capsule Take 1 capsule by mouth daily.     Marland Kitchen ezetimibe (ZETIA) 10 MG tablet TAKE 1 TABLET(10 MG) BY MOUTH DAILY 90 tablet 3  . hydrALAZINE (APRESOLINE) 50 MG tablet TAKE 1 TABLET(50 MG) BY MOUTH THREE TIMES DAILY 270 tablet 2  . insulin glargine (LANTUS) 100 UNIT/ML injection Inject 8 Units into the skin at bedtime.    Marland Kitchen labetalol (NORMODYNE) 200 MG tablet TAKE 1 TABLET(200 MG) BY MOUTH THREE TIMES DAILY 90 tablet 2  . nateglinide (STARLIX) 60 MG tablet Take 60 mg by mouth daily.     [name] current facility-administered medications on file prior to visit.     BP (!) 168/82   Temp 98.4 F (36.9 C)   Wt 220 lb (99.8 kg)   BMI 28.25 kg/m       Objective:   Physical Exam Vitals signs and nursing note reviewed.  Constitutional:      Appearance: Normal appearance.  Neck:     Musculoskeletal: Normal range of motion and neck supple.  Cardiovascular:     Rate and Rhythm: Normal rate.     Pulses: Normal pulses.     Heart sounds: Normal heart sounds.  Pulmonary:     Effort: Pulmonary effort is normal.  Skin:    Capillary Refill: Capillary refill takes less than 2 seconds.  Neurological:     Mental Status: He is alert and oriented to person, place, and time.       Assessment & Plan:  1. Diabetes mellitus type 2 in obese (HCC) - POC HgB A1c- 6.5  - [name] change in regimen  - Ok to have snack before bed  - Follow up in 3 months or sooner if needed  Dorothyann Peng, NP

## 2018-06-19 NOTE — Progress Notes (Signed)
I agree with the above plan 

## 2018-06-29 LAB — CUP PACEART REMOTE DEVICE CHECK
Date Time Interrogation Session: 20191201190720
Implantable Pulse Generator Implant Date: 20190108

## 2018-07-06 ENCOUNTER — Ambulatory Visit (INDEPENDENT_AMBULATORY_CARE_PROVIDER_SITE_OTHER): Payer: Medicare Other

## 2018-07-06 DIAGNOSIS — I639 Cerebral infarction, unspecified: Secondary | ICD-10-CM

## 2018-07-06 LAB — CUP PACEART REMOTE DEVICE CHECK
Date Time Interrogation Session: 20200103193746
Implantable Pulse Generator Implant Date: 20190108

## 2018-07-09 NOTE — Progress Notes (Signed)
[facility] Summary Report / Loop Recorder 

## 2018-07-15 ENCOUNTER — Other Ambulatory Visit: Payer: Self-pay | Admitting: Adult Health

## 2018-07-17 NOTE — Telephone Encounter (Signed)
Sent to the pharmacy by e-scribe. 

## 2018-08-02 ENCOUNTER — Telehealth: Payer: Self-pay | Admitting: Adult Health

## 2018-08-02 NOTE — Telephone Encounter (Signed)
Spoke with [name] - wanting to know if patient could go back to taking Insulin earlier in the morning.  Currently taking Lantus insulin at night around 10pm and having to wait until 11:30 to check his sugars and then if he gets hungry he would be eating at 11:30pm which is too late to eat and go to bed. Pt is taking 8 Units Lantus at 10pm.  Takes nateglinide 60 MG tablet at dinner.  Eats dinner about 5pm and diet has been adjusted which is why he is hungry by 11:30pm after checking his sugars.   Please advise, thanks.

## 2018-08-02 NOTE — Telephone Encounter (Signed)
Copied from [place] [id number]. Topic: Quick Communication - See Telephone Encounter >> Aug 02, 2018  3:17 PM Rutherford Nail, [place] wrote: CRM for notification. See Telephone encounter for: 08/02/18. Patient's wife, [name], calling and would like a call from [place] or [place]. States that she would like to know if they could change something. Would not go into detail. CB#: [phone]

## 2018-08-02 NOTE — Telephone Encounter (Signed)
I am ok with him taking Lantus at breakfast

## 2018-08-03 NOTE — Telephone Encounter (Signed)
Spoke with [name]. Aware of recommendations per [facility].  Nothing further needed.

## 2018-08-08 ENCOUNTER — Ambulatory Visit (INDEPENDENT_AMBULATORY_CARE_PROVIDER_SITE_OTHER): Payer: Medicare Other

## 2018-08-08 DIAGNOSIS — I639 Cerebral infarction, unspecified: Secondary | ICD-10-CM

## 2018-08-10 LAB — CUP PACEART REMOTE DEVICE CHECK
Date Time Interrogation Session: 20200205204125
Implantable Pulse Generator Implant Date: 20190108

## 2018-08-13 NOTE — Telephone Encounter (Signed)
Pt's wife called and stated after making changes with Lantus on 08/04/18 pt stated he has been feeling funny and off balance and last week his night time BP readings were mon 173/81 wed 173/85  fri 177/86  sat 186/85 Wife stated it seemed like when the BP was up the Blood sugar down. Please advise. CB#[phone]

## 2018-08-13 NOTE — Telephone Encounter (Signed)
Can this wait till Monday? Please advise. Thanks!

## 2018-08-13 NOTE — Telephone Encounter (Signed)
BP does not seem critically high, but pt may need OV. Routing to [place].

## 2018-08-14 NOTE — Telephone Encounter (Signed)
Needs to follow up with nephrology

## 2018-08-14 NOTE — Telephone Encounter (Signed)
Spoke to [place] that nephrology be called.  Nothing further needed.

## 2018-08-17 NOTE — Progress Notes (Signed)
[facility] Summary Report / Loop Recorder 

## 2018-09-04 ENCOUNTER — Other Ambulatory Visit: Payer: Self-pay | Admitting: Adult Health

## 2018-09-04 DIAGNOSIS — E119 Type 2 diabetes mellitus without complications: Secondary | ICD-10-CM

## 2018-09-05 NOTE — Telephone Encounter (Signed)
Sent to the pharmacy by e-scribe. 

## 2018-09-10 ENCOUNTER — Ambulatory Visit (INDEPENDENT_AMBULATORY_CARE_PROVIDER_SITE_OTHER): Payer: Medicare Other | Admitting: *Deleted

## 2018-09-10 DIAGNOSIS — I639 Cerebral infarction, unspecified: Secondary | ICD-10-CM | POA: Diagnosis not present

## 2018-09-11 LAB — CUP PACEART REMOTE DEVICE CHECK
Date Time Interrogation Session: 20200309203841
Implantable Pulse Generator Implant Date: 20190108

## 2018-09-17 NOTE — Progress Notes (Signed)
[facility] Summary Report / Loop Recorder 

## 2018-09-18 LAB — CBC AND DIFFERENTIAL
HCT: 34 — AB (ref 41–53)
Hemoglobin: 11.3 — AB (ref 13.5–17.5)
Neutrophils Absolute: 4
Platelets: 261 (ref 150–399)
WBC: 6.7

## 2018-09-18 LAB — BASIC METABOLIC PANEL
BUN: 46 — AB (ref 4–21)
Creatinine: 2.6 — AB (ref 0.6–1.3)
Glucose: 105
Potassium: 4.3 (ref 3.4–5.3)
Sodium: 141 (ref 137–147)

## 2018-09-18 LAB — POCT ERYTHROCYTE SEDIMENTATION RATE, NON-AUTOMATED: Sed Rate: 28

## 2018-09-18 LAB — VITAMIN D 25 HYDROXY (VIT D DEFICIENCY, FRACTURES): Vit D, 25-Hydroxy: 46

## 2018-09-18 LAB — HEPATIC FUNCTION PANEL
ALT: 11 (ref 10–40)
AST: 13 — AB (ref 14–40)
Alkaline Phosphatase: 55 (ref 25–125)
Bilirubin, Total: 0.3

## 2018-09-25 ENCOUNTER — Telehealth: Payer: Self-pay | Admitting: Adult Health

## 2018-09-25 NOTE — Telephone Encounter (Signed)
Left message with spouse to have the patient call and reschedule his appointment for Thursday 3 months out.

## 2018-09-27 ENCOUNTER — Encounter: Payer: Medicare Other | Admitting: Adult Health

## 2018-10-05 ENCOUNTER — Telehealth: Payer: Self-pay

## 2018-10-05 ENCOUNTER — Encounter: Payer: Medicare Other | Admitting: Physical Medicine & Rehabilitation

## 2018-10-05 NOTE — Telephone Encounter (Signed)
Transmission received, no true AF seen  Chanetta Marshall, NP 10/05/2018 3:38 PM

## 2018-10-05 NOTE — Telephone Encounter (Signed)
I spoke with the pt wife and she agreed to send the manual transmission. I told her if she needs help to call my direct office number.

## 2018-10-12 ENCOUNTER — Other Ambulatory Visit: Payer: Self-pay | Admitting: Adult Health

## 2018-10-15 ENCOUNTER — Other Ambulatory Visit: Payer: Self-pay | Admitting: Family Medicine

## 2018-10-15 ENCOUNTER — Ambulatory Visit (INDEPENDENT_AMBULATORY_CARE_PROVIDER_SITE_OTHER): Payer: Medicare Other | Admitting: *Deleted

## 2018-10-15 ENCOUNTER — Encounter: Payer: Self-pay | Admitting: Family Medicine

## 2018-10-15 ENCOUNTER — Other Ambulatory Visit: Payer: Self-pay

## 2018-10-15 DIAGNOSIS — I639 Cerebral infarction, unspecified: Secondary | ICD-10-CM

## 2018-10-15 DIAGNOSIS — E1169 Type 2 diabetes mellitus with other specified complication: Secondary | ICD-10-CM

## 2018-10-15 DIAGNOSIS — E669 Obesity, unspecified: Principal | ICD-10-CM

## 2018-10-15 LAB — CUP PACEART REMOTE DEVICE CHECK
Date Time Interrogation Session: 20200411214212
Implantable Pulse Generator Implant Date: 20190108

## 2018-10-15 MED ORDER — VITAMIN D (CHOLECALCIFEROL) 25 MCG (1000 UT) PO TABS: 1.0000 | ORAL_TABLET | Freq: Every day | ORAL | 3 refills | Status: DC

## 2018-10-15 NOTE — Telephone Encounter (Signed)
Sent to the pharmacy by e-scribe. 

## 2018-10-16 ENCOUNTER — Other Ambulatory Visit: Payer: Self-pay

## 2018-10-16 ENCOUNTER — Other Ambulatory Visit (INDEPENDENT_AMBULATORY_CARE_PROVIDER_SITE_OTHER): Payer: Medicare Other

## 2018-10-16 DIAGNOSIS — E669 Obesity, unspecified: Secondary | ICD-10-CM

## 2018-10-16 DIAGNOSIS — E1169 Type 2 diabetes mellitus with other specified complication: Secondary | ICD-10-CM

## 2018-10-16 LAB — HEMOGLOBIN A1C: Hgb A1c MFr Bld: 6.5 % (ref 4.6–6.5)

## 2018-10-17 ENCOUNTER — Encounter: Payer: Self-pay | Admitting: Adult Health

## 2018-10-17 ENCOUNTER — Ambulatory Visit (INDEPENDENT_AMBULATORY_CARE_PROVIDER_SITE_OTHER): Payer: Medicare Other | Admitting: Adult Health

## 2018-10-17 DIAGNOSIS — E1169 Type 2 diabetes mellitus with other specified complication: Secondary | ICD-10-CM | POA: Diagnosis not present

## 2018-10-17 DIAGNOSIS — M545 Low back pain, unspecified: Secondary | ICD-10-CM

## 2018-10-17 DIAGNOSIS — E669 Obesity, unspecified: Secondary | ICD-10-CM | POA: Diagnosis not present

## 2018-10-17 DIAGNOSIS — I10 Essential (primary) hypertension: Secondary | ICD-10-CM | POA: Diagnosis not present

## 2018-10-17 NOTE — Progress Notes (Signed)
Virtual Visit via Video Note  I connected with [name] [name] on 10/17/18 at  9:30 AM EDT by a video enabled telemedicine application and verified that I am speaking with the correct person using two identifiers.  Location patient: home Location provider:work or home office Persons participating in the virtual visit: patient, provider  I discussed the limitations of evaluation and management by telemedicine and the availability of in person appointments. The patient expressed understanding and agreed to proceed.   HPI: 73 year old male who is being evaluated today for follow-up regarding diabetes.  He is currently maintained on Lantus 8 units every morning and Starlix 60 mg daily.  His last A1c in December was 6.5.  He is eating a heart healthy diet and is exercising on a routine basis.  He does report some hypoglycemic episodes and is feeling fatigued and as if his balance is off at times. His log does not show any blood sugars below 100 but most are in the low 100's.   He is walking about 3.5 miles 3 days a week.   His blood pressure levels have been stable in the 120-150's. This is managed by Nephrology.   He also reports some low back pain over the last week.  Pain is worse with twisting and bending does have some discomfort with ambulation.  Feels as though the pain is described as "soreness".  He denies any issues with bowel or bladder.  Patient feels as though it is more muscular.  He did use a heating pad earlier in the week which seemed to help with his pain.   ROS: See pertinent positives and negatives per HPI.  Past Medical History:  Diagnosis Date  . DIABETES MELLITUS, TYPE II 01/02/2007  . HYPERLIPIDEMIA 06/18/2007  . HYPERTENSION 01/02/2007  . RENAL INSUFFICIENCY 02/05/2008  . Stroke [facility])     Past Surgical History:  Procedure Laterality Date  . LOOP RECORDER INSERTION N/A 07/11/2017   Procedure: LOOP RECORDER INSERTION;  Surgeon: Thompson Grayer, MD;  Location: [facility];  Service: Cardiovascular;  Laterality: N/A;  . SHOULDER SURGERY     left  . TEE WITHOUT CARDIOVERSION N/A 07/11/2017   Procedure: TRANSESOPHAGEAL ECHOCARDIOGRAM (TEE);  Surgeon: Dorothy Spark, MD;  Location[facility] ENDOSCOPY;  Service: Cardiovascular;  Laterality: N/A;  . TRANSURETHRAL RESECTION OF PROSTATE     history of retention/hematuria     Family History  Problem Relation Age of Onset  . Hypertension Mother   . Diabetes Mother   . Stroke Father   . Stroke Maternal Grandmother      Current Outpatient Medications:  .  aspirin 325 MG EC tablet, Take 1 tablet (325 mg total) by mouth daily., Disp: 30 tablet, Rfl: 0 .  benazepril (LOTENSIN) 40 MG tablet, Take 40 mg by mouth daily., Disp: , Rfl:  .  calcitRIOL (ROCALTROL) 0.25 MCG capsule, Take 1 capsule by mouth daily. , Disp: , Rfl:  .  ezetimibe (ZETIA) 10 MG tablet, TAKE 1 TABLET(10 MG) BY MOUTH DAILY, Disp: 90 tablet, Rfl: 3 .  glucose blood test strip, CHECK BLOOD SUGARS TWICE DAILY, Disp: 200 each, Rfl: 3 .  hydrALAZINE (APRESOLINE) 50 MG tablet, TAKE 1 TABLET(50 MG) BY MOUTH THREE TIMES DAILY, Disp: 270 tablet, Rfl: 2 .  insulin glargine (LANTUS) 100 UNIT/ML injection, Inject 8 Units into the skin at bedtime., Disp: , Rfl:  .  labetalol (NORMODYNE) 200 MG tablet, TAKE 1 TABLET(200 MG) BY MOUTH THREE TIMES DAILY, Disp: 270 tablet, Rfl: 0 .  nateglinide (STARLIX) 60 MG tablet, Take 60 mg by mouth daily., Disp: , Rfl:  .  Vitamin D, Cholecalciferol, 25 MCG (1000 UT) TABS, Take 1 tablet by mouth daily., Disp: 90 tablet, Rfl: 3  EXAM:  VITALS per patient if applicable:  GENERAL: alert, oriented, appears well and in no acute distress  HEENT: atraumatic, conjunttiva clear, no obvious abnormalities on inspection of external nose and ears  NECK: normal movements of the head and neck  LUNGS: on inspection no signs of respiratory distress, breathing rate appears normal, no obvious gross SOB, gasping or wheezing  CV: no  obvious cyanosis  MS: moves all visible extremities without noticeable abnormality  PSYCH/NEURO: pleasant and cooperative, no obvious depression or anxiety, speech and thought processing grossly intact  ASSESSMENT AND PLAN:  Discussed the following assessment and plan:  1C is 6.5.  His blood sugars have been very well controlled.  Due to possible hypoglycemia issues with fatigue and feeling off balance we will have him decrease his Lantus to 6 units daily.  He will follow-up with me if his symptoms do not resolve with the decrease in Lantus.    Continue to follow-up with nephrology as directed for hypertension.   For his low back pain which is felt to be more muscular.  I advised him to use a heating pad throughout the day for approximately 20 minutes at a time, can use Tylenol and do stretching exercises as well.  If does not improve then please let me know  Diabetes mellitus type 2 in obese [facility])  Uncontrolled hypertension  Acute bilateral low back pain without sciatica     I discussed the assessment and treatment plan with the patient. The patient was provided an opportunity to ask questions and all were answered. The patient agreed with the plan and demonstrated an understanding of the instructions.   The patient was advised to call back or seek an in-person evaluation if the symptoms worsen or if the condition fails to improve as anticipated.   Dorothyann Peng, NP

## 2018-10-22 NOTE — Progress Notes (Signed)
[facility] Summary Report / Loop Recorder 

## 2018-10-25 ENCOUNTER — Other Ambulatory Visit: Payer: Self-pay | Admitting: Adult Health

## 2018-10-25 DIAGNOSIS — I129 Hypertensive chronic kidney disease with stage 1 through stage 4 chronic kidney disease, or unspecified chronic kidney disease: Secondary | ICD-10-CM

## 2018-10-26 NOTE — Telephone Encounter (Signed)
Sent to the pharmacy by e-scribe. 

## 2018-11-01 ENCOUNTER — Other Ambulatory Visit: Payer: Self-pay

## 2018-11-01 ENCOUNTER — Ambulatory Visit (INDEPENDENT_AMBULATORY_CARE_PROVIDER_SITE_OTHER): Payer: Medicare Other | Admitting: Adult Health

## 2018-11-01 ENCOUNTER — Telehealth: Payer: Self-pay | Admitting: Adult Health

## 2018-11-01 DIAGNOSIS — R5383 Other fatigue: Secondary | ICD-10-CM

## 2018-11-01 DIAGNOSIS — D649 Anemia, unspecified: Secondary | ICD-10-CM

## 2018-11-01 DIAGNOSIS — R2681 Unsteadiness on feet: Secondary | ICD-10-CM | POA: Diagnosis not present

## 2018-11-01 MED ORDER — VITAMIN D3 125 MCG (5000 UT) PO CAPS: 5000.0000 [IU] | ORAL_CAPSULE | Freq: Every day | ORAL | 1 refills | Status: DC

## 2018-11-01 NOTE — Telephone Encounter (Signed)
Copied from [place] [id number]. Topic: General - Other >> Nov 01, 2018 11:12 AM Keene Breath wrote: Reason for CRM: Patient's wife call to inform the nurse or doctor that her husband is still feeling fatigued and his balance is a little off, even after dropping his insulin dosage.  Wife said that the doctor wanted her to let them know if the patient had a change.  Please advise and call back at [phone]

## 2018-11-02 ENCOUNTER — Other Ambulatory Visit: Payer: Self-pay

## 2018-11-02 ENCOUNTER — Other Ambulatory Visit (INDEPENDENT_AMBULATORY_CARE_PROVIDER_SITE_OTHER): Payer: Medicare Other

## 2018-11-02 ENCOUNTER — Encounter: Payer: Self-pay | Admitting: Adult Health

## 2018-11-02 DIAGNOSIS — R2681 Unsteadiness on feet: Secondary | ICD-10-CM | POA: Diagnosis not present

## 2018-11-02 DIAGNOSIS — R5383 Other fatigue: Secondary | ICD-10-CM | POA: Diagnosis not present

## 2018-11-02 LAB — IBC + FERRITIN
Ferritin: 5.9 ng/mL — ABNORMAL LOW (ref 22.0–322.0)
Iron: 31 ug/dL — ABNORMAL LOW (ref 42–165)
Saturation Ratios: 7.5 % — ABNORMAL LOW (ref 20.0–50.0)
Transferrin: 294 mg/dL (ref 212.0–360.0)

## 2018-11-02 LAB — CBC WITH DIFFERENTIAL/PLATELET
Basophils Absolute: 0 10*3/uL (ref 0.0–0.1)
Basophils Relative: 0.7 % (ref 0.0–3.0)
Eosinophils Absolute: 0.5 10*3/uL (ref 0.0–0.7)
Eosinophils Relative: 7.5 % — ABNORMAL HIGH (ref 0.0–5.0)
HCT: 26.8 % — ABNORMAL LOW (ref 39.0–52.0)
Hemoglobin: 9 g/dL — ABNORMAL LOW (ref 13.0–17.0)
Lymphocytes Relative: 23.3 % (ref 12.0–46.0)
Lymphs Abs: 1.7 10*3/uL (ref 0.7–4.0)
MCHC: 33.4 g/dL (ref 30.0–36.0)
MCV: 78 fl (ref 78.0–100.0)
Monocytes Absolute: 0.5 10*3/uL (ref 0.1–1.0)
Monocytes Relative: 6.6 % (ref 3.0–12.0)
Neutro Abs: 4.5 10*3/uL (ref 1.4–7.7)
Neutrophils Relative %: 61.9 % (ref 43.0–77.0)
Platelets: 317 10*3/uL (ref 150.0–400.0)
RBC: 3.44 Mil/uL — ABNORMAL LOW (ref 4.22–5.81)
RDW: 14.5 % (ref 11.5–15.5)
WBC: 7.3 10*3/uL (ref 4.0–10.5)

## 2018-11-02 LAB — TSH: TSH: 2.48 u[IU]/mL (ref 0.35–4.50)

## 2018-11-02 LAB — VITAMIN D 25 HYDROXY (VIT D DEFICIENCY, FRACTURES): VITD: 50.29 ng/mL (ref 30.00–100.00)

## 2018-11-02 LAB — VITAMIN B12: Vitamin B-12: 296 pg/mL (ref 211–911)

## 2018-11-02 NOTE — Progress Notes (Addendum)
Virtual Visit via Video Note  I connected with [name] [name] on 11/01/2018 at  3:00 PM EDT by a video enabled telemedicine application and verified that I am speaking with the correct person using two identifiers.  Location patient: home Location provider:work or home office Persons participating in the virtual visit: patient, provider  I discussed the limitations of evaluation and management by telemedicine and the availability of in person appointments. The patient expressed understanding and agreed to proceed.   HPI: 73 year old male who being evaluated today for follow-up regarding unsteady gait and fatigue.  He was last evaluated for this on 10/17/2018 at which time his low blood sugars were thought to be a possible cause.  Lantus was decreased from 8units to 6 units.  Since changes blood sugars have been in the 120s to 140s and he no longer feels as though he is getting hypoglycemic events.  Unfortunately, he still feels fatigued and feels as though he is "off balance" when he is ambulating.  He denies any falls.  Additionally he also reports that he "always feels fatigued and wants to nap a lot".  Reports that she says that sometimes "he walks like he is off balance kind of in a zigzag motion."  Denies depression, fever, chills, blood in stool, cough, chest pain, shortness of breath.  The last week he has not done any daily walking because "I just do not feel like I have the energy to".  Home blood pressures have been in the 329-924Q systolic  He had labs drawn approximately 1 month ago which showed a hemoglobin level of 11.3 this was down from 12.3 approximately 10 months prior.,  Vitamin D level was low normal at 46   ROS: See pertinent positives and negatives per HPI.  Past Medical History:  Diagnosis Date  . DIABETES MELLITUS, TYPE II 01/02/2007  . HYPERLIPIDEMIA 06/18/2007  . HYPERTENSION 01/02/2007  . RENAL INSUFFICIENCY 02/05/2008  . Stroke [facility])     Past Surgical History:   Procedure Laterality Date  . LOOP RECORDER INSERTION N/A 07/11/2017   Procedure: LOOP RECORDER INSERTION;  Surgeon: Thompson Grayer, MD;  Location: [facility];  Service: Cardiovascular;  Laterality: N/A;  . SHOULDER SURGERY     left  . TEE WITHOUT CARDIOVERSION N/A 07/11/2017   Procedure: TRANSESOPHAGEAL ECHOCARDIOGRAM (TEE);  Surgeon: Dorothy Spark, MD;  Location: [facility] ENDOSCOPY;  Service: Cardiovascular;  Laterality: N/A;  . TRANSURETHRAL RESECTION OF PROSTATE     history of retention/hematuria     Family History  Problem Relation Age of Onset  . Hypertension Mother   . Diabetes Mother   . Stroke Father   . Stroke Maternal Grandmother      Current Outpatient Medications:  .  aspirin 325 MG EC tablet, Take 1 tablet (325 mg total) by mouth daily., Disp: 30 tablet, Rfl: 0 .  benazepril (LOTENSIN) 40 MG tablet, Take 40 mg by mouth daily., Disp: , Rfl:  .  calcitRIOL (ROCALTROL) 0.25 MCG capsule, Take 1 capsule by mouth daily. , Disp: , Rfl:  .  Cholecalciferol (VITAMIN D3) 125 MCG (5000 UT) CAPS, Take 1 capsule (5,000 Units total) by mouth daily., Disp: 90 capsule, Rfl: 1 .  ezetimibe (ZETIA) 10 MG tablet, TAKE 1 TABLET(10 MG) BY MOUTH DAILY, Disp: 90 tablet, Rfl: 3 .  glucose blood test strip, CHECK BLOOD SUGARS TWICE DAILY, Disp: 200 each, Rfl: 3 .  hydrALAZINE (APRESOLINE) 50 MG tablet, TAKE 1 TABLET BY MOUTH THREE TIMES DAILY, Disp: 270 tablet,  Rfl: 0 .  insulin glargine (LANTUS) 100 UNIT/ML injection, Inject 6 Units into the skin at bedtime. , Disp: , Rfl:  .  labetalol (NORMODYNE) 200 MG tablet, TAKE 1 TABLET(200 MG) BY MOUTH THREE TIMES DAILY, Disp: 270 tablet, Rfl: 0 .  nateglinide (STARLIX) 60 MG tablet, Take 60 mg by mouth daily., Disp: , Rfl:   EXAM:  VITALS per patient if applicable:  GENERAL: alert, oriented, appears well and in no acute distress  HEENT: atraumatic, conjunttiva clear, no obvious abnormalities on inspection of external nose and ears  NECK:  normal movements of the head and neck  LUNGS: on inspection no signs of respiratory distress, breathing rate appears normal, no obvious gross SOB, gasping or wheezing  CV: no obvious cyanosis  MS: moves all visible extremities without noticeable abnormality  PSYCH/NEURO: pleasant and cooperative, no obvious depression or anxiety, speech and thought processing grossly intact  ASSESSMENT AND PLAN:  Discussed the following assessment and plan:  We will get labs including vitamin D, iron panel, thyroid, vitamin B12, and CBC.  Consider supplementation.  We will add vitamin D 5000units regimen.  Consider PT in the future  Fatigue, unspecified type - Plan: Cholecalciferol (VITAMIN D3) 125 MCG (5000 UT) CAPS, CBC with Differential/Platelet, Iron and TIBC, Vitamin B12, Vitamin D, 25-hydroxy, TSH  Unsteady gait - Plan: Cholecalciferol (VITAMIN D3) 125 MCG (5000 UT) CAPS, CBC with Differential/Platelet, Iron and TIBC, Vitamin B12, Vitamin D, 25-hydroxy, TSH     I discussed the assessment and treatment plan with the patient. The patient was provided an opportunity to ask questions and all were answered. The patient agreed with the plan and demonstrated an understanding of the instructions.   The patient was advised to call back or seek an in-person evaluation if the symptoms worsen or if the condition fails to improve as anticipated.   Dorothyann Peng, NP

## 2018-11-02 NOTE — Telephone Encounter (Signed)
Spoke to patients wife and informed her of labs. Has an anemia but unsure of iron deficiency vs GI bleed vs possible CA. Will have him start on OTC iron supplement and will refer to GI

## 2018-11-13 ENCOUNTER — Ambulatory Visit (INDEPENDENT_AMBULATORY_CARE_PROVIDER_SITE_OTHER): Payer: Medicare Other | Admitting: Gastroenterology

## 2018-11-13 ENCOUNTER — Encounter: Payer: Self-pay | Admitting: Gastroenterology

## 2018-11-13 ENCOUNTER — Other Ambulatory Visit: Payer: Self-pay

## 2018-11-13 VITALS — Ht 74.0 in | Wt 220.0 lb

## 2018-11-13 DIAGNOSIS — D509 Iron deficiency anemia, unspecified: Secondary | ICD-10-CM | POA: Diagnosis not present

## 2018-11-13 DIAGNOSIS — R195 Other fecal abnormalities: Secondary | ICD-10-CM

## 2018-11-13 MED ORDER — PEG 3350-KCL-NA BICARB-NACL 420 G PO SOLR: 4000.0000 mL | ORAL | 0 refills | Status: DC

## 2018-11-13 NOTE — Patient Instructions (Addendum)
He knows to start taking oMeprazole 20mg  (OTC) once daily for now.  We will arrange upper endoscopy and colonoscopy for new iron deficiency anemia, recent dark stools (within the next 2 to 3 weeks)  You have been scheduled for a colonoscopy. Please follow written instructions given to you at your visit today.  Please pick up your prep supplies at the pharmacy within the next 1-3 days. If you use inhalers (even only as needed), please bring them with you on the day of your procedure. Your physician has requested that you go to www.startemmi.com and enter the access code given to you at your visit today. This web site gives a general overview about your procedure. However, you should still follow specific instructions given to you by our office regarding your preparation for the procedure.

## 2018-11-13 NOTE — Progress Notes (Signed)
This service was provided via virtual visit. Only audio was used.  The patient was located at home.  I was located in my office.  The patient did consent to this virtual visit and is aware of possible charges through their insurance for this visit.  The patient is a new patient.  His wife was on the phone as well.  My certified medical assistant, Grace Bushy, contributed to this visit by contacting the patient by phone 1 or 2 business days prior to the appointment and also followed up on the recommendations I made after the visit.  Time spent on virtual visit: 23 min   HPI: This is a very pleasant 73 year old man  He's been fatigued for about a month.  No red rectal bleeding but he did have black stools twice about 2 weeks ago.  No other changed in his bowels.   He does not take NSAIDs, Tylenol or aspirin.  He really has not had any serious abdominal pains.  This morning he had some mild nausea without vomiting.  Colon cancer does not run in his family.  His weight has been overall stable.  I did a colonoscopy for him February 2008 for routine risk screening.  No adenomatous polyps were found, no precancerous polyps.  He did have diverticulosis.  He recently saw his primary care physician and was complaining of fatigue.  CBC showed a hemoglobin of 9, MCV of 78, ferritin 5.9, total iron 31.  His hemoglobin 2 months ago was 11.3.  He was put on daily iron supplement and referred here.  Chief complaint is iron deficiency anemia, recent dark stools  ROS: complete GI ROS as described in HPI, all other review negative.  Constitutional:  No unintentional weight loss   Past Medical History:  Diagnosis Date  . DIABETES MELLITUS, TYPE II 01/02/2007  . HYPERLIPIDEMIA 06/18/2007  . HYPERTENSION 01/02/2007  . RENAL INSUFFICIENCY 02/05/2008  . Stroke ([place])   . Stroke [facility]) 2019    Past Surgical History:  Procedure Laterality Date  . LOOP RECORDER INSERTION N/A 07/11/2017   Procedure: LOOP RECORDER  INSERTION;  Surgeon: Thompson Grayer, MD;  Location: [place] [facility];  Service: Cardiovascular;  Laterality: N/A;  . SHOULDER SURGERY     left  . TEE WITHOUT CARDIOVERSION N/A 07/11/2017   Procedure: TRANSESOPHAGEAL ECHOCARDIOGRAM (TEE);  Surgeon: Dorothy Spark, MD;  Location: [facility] ENDOSCOPY;  Service: Cardiovascular;  Laterality: N/A;  . TRANSURETHRAL RESECTION OF PROSTATE     history of retention/hematuria     Current Outpatient Medications  Medication Sig Dispense Refill  . benazepril (LOTENSIN) 40 MG tablet Take 40 mg by mouth daily.    . calcitRIOL (ROCALTROL) 0.25 MCG capsule Take 1 capsule by mouth daily.     Marland Kitchen ezetimibe (ZETIA) 10 MG tablet TAKE 1 TABLET(10 MG) BY MOUTH DAILY 90 tablet 3  . Ferrous Gluconate-C-Folic Acid (IRON-C PO) Take by mouth.    . ferrous sulfate 325 (65 FE) MG tablet Take 65 mg by mouth daily.    Marland Kitchen glucose blood test strip CHECK BLOOD SUGARS TWICE DAILY 200 each 3  . hydrALAZINE (APRESOLINE) 50 MG tablet TAKE 1 TABLET BY MOUTH THREE TIMES DAILY 270 tablet 0  . insulin glargine (LANTUS) 100 UNIT/ML injection Inject 6 Units into the skin at bedtime.     Marland Kitchen labetalol (NORMODYNE) 200 MG tablet TAKE 1 TABLET(200 MG) BY MOUTH THREE TIMES DAILY 270 tablet 0  . nateglinide (STARLIX) 60 MG tablet Take 60 mg by mouth  daily.    . NON FORMULARY      No current facility-administered medications for this visit.     Allergies as of 11/13/2018 - Review Complete 11/08/2018  Allergen Reaction Noted  . Repatha [evolocumab] Hypertension 12/07/2017  . Bactrim [sulfamethoxazole-trimethoprim] Hives, Itching, and Swelling 12/21/2010  . Penicillins  08/02/2006  . Statins  12/20/2011  . Sulfa drugs cross reactors Hives, Itching, and Swelling 12/21/2010    Family History  Problem Relation Age of Onset  . Hypertension Mother   . Diabetes Mother   . Stroke Father   . Stroke Maternal Grandmother     Social History   Socioeconomic History  . Marital status: Married     Spouse name: Not on file  . Number of children: Not on file  . Years of education: Not on file  . Highest education level: Not on file  Occupational History  . Not on file  Social Needs  . Financial resource strain: Not on file  . Food insecurity:    Worry: Not on file    Inability: Not on file  . Transportation needs:    Medical: Not on file    Non-medical: Not on file  Tobacco Use  . Smoking status: Never Smoker  . Smokeless tobacco: Never Used  Substance and Sexual Activity  . Alcohol use: No  . Drug use: No  . Sexual activity: Not on file  Lifestyle  . Physical activity:    Days per week: Not on file    Minutes per session: Not on file  . Stress: Not on file  Relationships  . Social connections:    Talks on phone: Not on file    Gets together: Not on file    Attends religious service: Not on file    Active member of club or organization: Not on file    Attends meetings of clubs or organizations: Not on file    Relationship status: Not on file  . Intimate partner violence:    Fear of current or ex partner: Not on file    Emotionally abused: Not on file    Physically abused: Not on file    Forced sexual activity: Not on file  Other Topics Concern  . 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 Likes to go to football games.      Physical Exam: Unable to perform because this was a "telemed visit" due to current Covid-19 pandemic  Assessment and plan: 73 y.o. male with iron deficiency anemia, recent dark stools  He has new iron deficiency anemia.  He did have some dark stools about 2 weeks ago, he is pretty clear that this was before he started taking his iron supplement once daily.  No shortness of breath or orthostatic symptoms.  I recommended colonoscopy and upper endoscopy within the next 2 to 3 weeks.  I also  recommended that he start taking a single over-the-counter strength proton pump inhibitor once daily in case this is related to a gastric ulcer, serious gastritis or esophagitis.  I see no reason for any further blood tests or imaging studies prior to the endoscopic examinations.  Please see the "Patient Instructions" section for addition details about the plan.  Owens Loffler, MD [facility] 11/13/2018, 7:57 AM

## 2018-11-15 ENCOUNTER — Ambulatory Visit (INDEPENDENT_AMBULATORY_CARE_PROVIDER_SITE_OTHER): Payer: Medicare Other | Admitting: *Deleted

## 2018-11-15 ENCOUNTER — Other Ambulatory Visit: Payer: Self-pay

## 2018-11-15 DIAGNOSIS — I639 Cerebral infarction, unspecified: Secondary | ICD-10-CM | POA: Diagnosis not present

## 2018-11-15 LAB — CUP PACEART REMOTE DEVICE CHECK
Date Time Interrogation Session: 20200514195524
Implantable Pulse Generator Implant Date: 20190108

## 2018-11-20 ENCOUNTER — Encounter: Payer: Medicare Other | Admitting: Adult Health

## 2018-11-20 NOTE — Progress Notes (Signed)
[facility] Summary Report / Loop Recorder 

## 2018-11-21 ENCOUNTER — Encounter: Payer: Self-pay | Admitting: Adult Health

## 2018-11-21 ENCOUNTER — Other Ambulatory Visit: Payer: Self-pay

## 2018-11-21 ENCOUNTER — Ambulatory Visit (INDEPENDENT_AMBULATORY_CARE_PROVIDER_SITE_OTHER): Payer: Medicare Other | Admitting: Adult Health

## 2018-11-21 DIAGNOSIS — E669 Obesity, unspecified: Secondary | ICD-10-CM | POA: Diagnosis not present

## 2018-11-21 DIAGNOSIS — D509 Iron deficiency anemia, unspecified: Secondary | ICD-10-CM

## 2018-11-21 DIAGNOSIS — E1169 Type 2 diabetes mellitus with other specified complication: Secondary | ICD-10-CM | POA: Diagnosis not present

## 2018-11-21 NOTE — Progress Notes (Signed)
   Subjective:    Patient ID: [name] [name], male    DOB: 1945-09-01, 73 y.o.   MRN: [id number]  HPI  Virtual Visit via Telephone Note  I connected with [name] [name] on 11/21/18 at 11:00 AM EDT by telephone and verified that I am speaking with the correct person using two identifiers.   I discussed the limitations, risks, security and privacy concerns of performing an evaluation and management service by telephone and the availability of in person appointments. I also discussed with the patient that there may be a patient responsible charge related to this service. The patient expressed understanding and agreed to proceed.  Location patient: home Location provider: work or home office Participants present for the call: patient, provider, wife Patient did not have a visit in the prior 7 days to address this/these issue(s).   History of Present Illness: 73 year old male who  has a past medical history of DIABETES MELLITUS, TYPE II (01/02/2007), HYPERLIPIDEMIA (06/18/2007), HYPERTENSION (01/02/2007), RENAL INSUFFICIENCY (02/05/2008), Stroke ([place]), and Stroke ([place]) (2019).  He was found to have a new iron deficiency anemia at the beginning of this month.  He was subsequently started on oral iron and referred to gastroenterology.  He has an upcoming endoscopy and colonoscopy on June 2.  He and his wife both report that he continues to feel fatigued but is taking less naps throughout the day.  They are also wondering about his insulin doses prior to procedures.   Observations/Objective: Patient sounds cheerful and well on the phone. I do not appreciate any SOB. Speech and thought processing are grossly intact. Patient reported vitals:  Assessment and Plan: 1. Iron deficiency anemia, unspecified iron deficiency anemia type -Eyes patient and his wife that I am not concerned that he continues to feel fatigued, oral iron supplementation can take 2 to 3 weeks if not more to start to notice  benefit.  Continue with oral iron  2. Diabetes mellitus type 2 in obese (HCC) -We will have him stop his Lantus dose the morning prior to procedure.  He can take his Starlix in the noon as he normally does.  Advised to monitor blood sugars more closely during prep for colonoscopy.    Follow Up Instructions:    I did not refer this patient for an OV in the next 24 hours for this/these issue(s).  I discussed the assessment and treatment plan with the patient. The patient was provided an opportunity to ask questions and all were answered. The patient agreed with the plan and demonstrated an understanding of the instructions.   The patient was advised to call back or seek an in-person evaluation if the symptoms worsen or if the condition fails to improve as anticipated.  I provided 30 minutes of non-face-to-face time during this encounter.   Dorothyann Peng, NP    Review of Systems     Objective:   Physical Exam        Assessment & Plan:

## 2018-12-02 ENCOUNTER — Telehealth: Payer: Self-pay | Admitting: *Deleted

## 2018-12-02 NOTE — Telephone Encounter (Signed)
Covid-19 screening questions  Have you traveled in the last 14 days? No If yes where?  Do you now or have you had a fever in the last 14 days? No  Do you have any respiratory symptoms of shortness of breath or cough now or in the last 14 days? No  Do you have any family members or close contacts with diagnosed or suspected Covid-19 in the past 14 days? No  Have you been tested for Covid-19 and found to be positive? No  Pt aware care partner to wait in car in parking lot and to wear mask into the building.

## 2018-12-04 ENCOUNTER — Telehealth: Payer: Self-pay | Admitting: Gastroenterology

## 2018-12-04 ENCOUNTER — Ambulatory Visit (AMBULATORY_SURGERY_CENTER): Payer: Medicare Other | Admitting: Gastroenterology

## 2018-12-04 ENCOUNTER — Encounter: Payer: Self-pay | Admitting: Gastroenterology

## 2018-12-04 ENCOUNTER — Other Ambulatory Visit: Payer: Self-pay

## 2018-12-04 VITALS — BP 195/98 | HR 67 | Temp 99.1°F | Resp 13 | Ht 74.0 in | Wt 220.0 lb

## 2018-12-04 DIAGNOSIS — D124 Benign neoplasm of descending colon: Secondary | ICD-10-CM

## 2018-12-04 DIAGNOSIS — K635 Polyp of colon: Secondary | ICD-10-CM

## 2018-12-04 DIAGNOSIS — D509 Iron deficiency anemia, unspecified: Secondary | ICD-10-CM | POA: Diagnosis not present

## 2018-12-04 DIAGNOSIS — D123 Benign neoplasm of transverse colon: Secondary | ICD-10-CM

## 2018-12-04 MED ORDER — SODIUM CHLORIDE 0.9 % IV SOLN: 500.0000 mL | Freq: Once | INTRAVENOUS | Status: DC

## 2018-12-04 NOTE — Patient Instructions (Signed)
Handout given for polyps.  Await pathology results from Dr. Ardis Hughs to determine next step.  YOU HAD AN ENDOSCOPIC PROCEDURE TODAY AT [facility]:   Refer to the procedure report that was given to you for any specific questions about what was found during the examination.  If the procedure report does not answer your questions, please call your gastroenterologist to clarify.  If you requested that your care partner not be given the details of your procedure findings, then the procedure report has been included in a sealed envelope for you to review at your convenience later.  YOU SHOULD EXPECT: Some feelings of bloating in the abdomen. Passage of more gas than usual.  Walking can help get rid of the air that was put into your GI tract during the procedure and reduce the bloating. If you had a lower endoscopy (such as a colonoscopy or flexible sigmoidoscopy) you may notice spotting of blood in your stool or on the toilet paper. If you underwent a bowel prep for your procedure, you may not have a normal bowel movement for a few days.  Please Note:  You might notice some irritation and congestion in your nose or some drainage.  This is from the oxygen used during your procedure.  There is no need for concern and it should clear up in a day or so.  SYMPTOMS TO REPORT IMMEDIATELY:   Following lower endoscopy (colonoscopy or flexible sigmoidoscopy):  Excessive amounts of blood in the stool  Significant tenderness or worsening of abdominal pains  Swelling of the abdomen that is new, acute  Fever of 100F or higher   Following upper endoscopy (EGD)  Vomiting of blood or coffee ground material  New chest pain or pain under the shoulder blades  Painful or persistently difficult swallowing  New shortness of breath  Fever of 100F or higher  Black, tarry-looking stools  For urgent or emergent issues, a gastroenterologist can be reached at any hour by calling [phone].   DIET:   We do recommend a small meal at first, but then you may proceed to your regular diet.  Drink plenty of fluids but you should avoid alcoholic beverages for 24 hours.  ACTIVITY:  You should plan to take it easy for the rest of today and you should NOT DRIVE or use heavy machinery until tomorrow (because of the sedation medicines used during the test).    FOLLOW UP: Our staff will call the number listed on your records 48-72 hours following your procedure to check on you and address any questions or concerns that you may have regarding the information given to you following your procedure. If we do not reach you, we will leave a message.  We will attempt to reach you two times.  During this call, we will ask if you have developed any symptoms of COVID 19. If you develop any symptoms (ie: fever, flu-like symptoms, shortness of breath, cough etc.) before then, please call [phone].  If you test positive for Covid 19 in the 2 weeks post procedure, please call and report this information to [place].    If any biopsies were taken you will be contacted by phone or by letter within the next 1-3 weeks.  Please call us at [phone] if you have not heard about the biopsies in 3 weeks.    SIGNATURES/CONFIDENTIALITY: You and/or your care partner have signed paperwork which will be entered into your electronic medical record.  These signatures attest to the  fact that that the information above on your After Visit Summary has been reviewed and is understood.  Full responsibility of the confidentiality of this discharge information lies with you and/or your care-partner.

## 2018-12-04 NOTE — Telephone Encounter (Signed)
I agree, thanks!

## 2018-12-04 NOTE — Telephone Encounter (Signed)
Patient has a procedure today at 7:30 w- Dr. Ardis Hughs Patient wife called said that the patient lips are swollen and would like to know if it could be a possible allergic reaction.

## 2018-12-04 NOTE — Op Note (Signed)
[place] Patient Name: Thunder Bridgewater Procedure Date: 12/04/2018 7:42 AM MRN: [id number] Endoscopist: Milus Banister , MD Age: 73 Referring MD:  Date of Birth: [birth year] Gender: Male Account #: [id number] Procedure:                Upper GI endoscopy Indications:              Iron deficiency anemia Medicines:                Monitored Anesthesia Care Procedure:                Pre-Anesthesia Assessment:                           - Prior to the procedure, a History and Physical                            was performed, and patient medications and                            allergies were reviewed. The patient's tolerance of                            previous anesthesia was also reviewed. The risks                            and benefits of the procedure and the sedation                            options and risks were discussed with the patient.                            All questions were answered, and informed consent                            was obtained. Prior Anticoagulants: The patient has                            taken no previous anticoagulant or antiplatelet                            agents. ASA Grade Assessment: II - A patient with                            mild systemic disease. After reviewing the risks                            and benefits, the patient was deemed in                            satisfactory condition to undergo the procedure.                           After obtaining informed consent, the endoscope was  passed under direct vision. Throughout the                            procedure, the patient's blood pressure, pulse, and                            oxygen saturations were monitored continuously. The                            Model GIF-[id number]) scope was introduced                            through the mouth, and advanced to the second part                            of duodenum. The upper GI  endoscopy was                            accomplished without difficulty. The patient                            tolerated the procedure well. Scope In: Scope Out: Findings:                 The esophagus was normal.                           The stomach was normal.                           The examined duodenum was normal.                           Biopsies for histology were taken with a cold                            forceps in the entire duodenum for evaluation of                            celiac disease. Complications:            No immediate complications. Estimated blood loss:                            None. Impression:               - Normal esophagus.                           - Normal stomach.                           - Normal examined duodenum.                           - Biopsies were taken with a cold forceps for  evaluation of celiac disease. Recommendation:           - Patient has a contact number available for                            emergencies. The signs and symptoms of potential                            delayed complications were discussed with the                            patient. Return to normal activities tomorrow.                            Written discharge instructions were provided to the                            patient.                           - Resume previous diet.                           - Continue present medications.                           - Await pathology results. If the biopsies do NOT                            show Celiac Sprue changes then will order chest                            xray and abd/pelvic CT to check for occult                            neoplasm. If those tests do not explain the new                            iron def anemia, then will check 2 sets of home                            FOBT tests. Milus Banister, MD 12/04/2018 8:10:51 AM This report has been signed electronically.

## 2018-12-04 NOTE — Progress Notes (Signed)
Called to room to assist during endoscopic procedure.  Patient ID and intended procedure confirmed with present staff. Received instructions for my participation in the procedure from the performing physician.  

## 2018-12-04 NOTE — Op Note (Signed)
[place] Patient Name: [name] [name] Procedure Date: 12/04/2018 7:43 AM MRN: [id number] Endoscopist: Milus Banister , MD Age: 73 Referring MD:  Date of Birth: Aug 25, 1945 Gender: Male Account #: [id number] Procedure:                Colonoscopy Indications:              Iron deficiency anemia Medicines:                Monitored Anesthesia Care Procedure:                Pre-Anesthesia Assessment:                           - Prior to the procedure, a History and Physical                            was performed, and patient medications and                            allergies were reviewed. The patient's tolerance of                            previous anesthesia was also reviewed. The risks                            and benefits of the procedure and the sedation                            options and risks were discussed with the patient.                            All questions were answered, and informed consent                            was obtained. Prior Anticoagulants: The patient has                            taken no previous anticoagulant or antiplatelet                            agents. ASA Grade Assessment: II - A patient with                            mild systemic disease. After reviewing the risks                            and benefits, the patient was deemed in                            satisfactory condition to undergo the procedure.                           After obtaining informed consent, the colonoscope  was passed under direct vision. Throughout the                            procedure, the patient's blood pressure, pulse, and                            oxygen saturations were monitored continuously.The                            colonoscopy was performed without difficulty. The                            patient tolerated the procedure well. The quality                            of the bowel preparation was excellent.  The                            ileocecal valve, appendiceal orifice, and rectum                            were photographed. The Colonoscope was introduced                            through the anus and advanced to the the cecum,                            identified by appendiceal orifice and ileocecal                            valve. Scope In: 8:52:77 AM Scope Out: 7:59:22 AM Scope Withdrawal Time: 0 hours 8 minutes 56 seconds  Total Procedure Duration: 0 hours 13 minutes 10 seconds  Findings:                 Two sessile polyps were found in the descending                            colon and transverse colon. The polyps were 2 to 3                            mm in size. These polyps were removed with a cold                            snare. Resection and retrieval were complete.                           The exam was otherwise without abnormality on                            direct and retroflexion views. Complications:            No immediate complications. Estimated blood loss:  None. Impression:               - Two 2 to 3 mm polyps in the descending colon and                            in the transverse colon, removed with a cold snare.                            Resected and retrieved.                           - The examination was otherwise normal on direct                            and retroflexion views. Recommendation:           - Patient has a contact number available for                            emergencies. The signs and symptoms of potential                            delayed complications were discussed with the                            patient. Return to normal activities tomorrow.                            Written discharge instructions were provided to the                            patient.                           - Resume previous diet.                           - Continue present medications.                           -  Repeat colonoscopy is recommended. The                            colonoscopy date will be determined after pathology                            results from today's exam become available for                            review. Milus Banister, MD 12/04/2018 8:02:01 AM This report has been signed electronically.

## 2018-12-04 NOTE — Progress Notes (Signed)
Covid screening and temp  done by [facility]. Vitl signs done by Rica Mote.

## 2018-12-04 NOTE — Progress Notes (Signed)
Report given to PACU, vss 

## 2018-12-06 ENCOUNTER — Telehealth: Payer: Self-pay | Admitting: Adult Health

## 2018-12-06 ENCOUNTER — Telehealth: Payer: Self-pay | Admitting: *Deleted

## 2018-12-06 NOTE — Telephone Encounter (Signed)
Copied from [place] [id number]. Topic: Quick Communication - Rx Refill/Question >> Dec 06, 2018  3:31 PM Gustavus Messing wrote: Medication: BD ultra fine pen needles mini pentapoint  ml by 31G  Has the patient contacted their pharmacy? No. (Agent: If no, request that the patient contact the pharmacy for the refill.) The patient needs more needs for his insulin. He only has 1 left.   Preferred Pharmacy (with phone number or street name): [facility] [place], [address] AT [address] [place] [phone] (Phone) [phone] (Fax)    Agent: Please be advised that RX refills may take up to 3 business days. We ask that you follow-up with your pharmacy. G

## 2018-12-06 NOTE — Telephone Encounter (Signed)
  Follow up Call-  Call back number 12/04/2018  Post procedure Call Back phone  # [phone]  Permission to leave phone message Yes  Some recent data might be hidden     Patient questions:  Do you have a fever, pain , or abdominal swelling? No. Pain Score  0 *  Have you tolerated food without any problems? Yes.    Have you been able to return to your normal activities? Yes.    Do you have any questions about your discharge instructions: Diet   No. Medications  No. Follow up visit  No.  Do you have questions or concerns about your Care? No.  Actions: * If pain score is 4 or above: No action needed, pain <4.  1. Have you developed a fever since your procedure? NO   2.   Have you had an respiratory symptoms (SOB or cough) since your procedure? NO  3.   Have you tested positive for COVID 19 since your procedure NO  4.   Have you had any family members/close contacts diagnosed with the COVID 19 since your procedure?  NO   If yes to any of these questions please route to Joylene John, RN and Alphonsa Gin, RN.

## 2018-12-07 ENCOUNTER — Other Ambulatory Visit: Payer: Self-pay

## 2018-12-07 MED ORDER — INSULIN PEN NEEDLE 31G X 5 MM MISC: 6.0000 [IU] | Freq: Every day | 0 refills | Status: DC

## 2018-12-07 MED ORDER — INSULIN GLARGINE 100 UNIT/ML ~~LOC~~ SOLN: 6.0000 [IU] | Freq: Every day | SUBCUTANEOUS | 1 refills | Status: DC

## 2018-12-10 ENCOUNTER — Other Ambulatory Visit: Payer: Self-pay

## 2018-12-10 DIAGNOSIS — D508 Other iron deficiency anemias: Secondary | ICD-10-CM

## 2018-12-10 DIAGNOSIS — N189 Chronic kidney disease, unspecified: Secondary | ICD-10-CM

## 2018-12-17 ENCOUNTER — Other Ambulatory Visit: Payer: Self-pay | Admitting: Adult Health

## 2018-12-18 ENCOUNTER — Ambulatory Visit (INDEPENDENT_AMBULATORY_CARE_PROVIDER_SITE_OTHER): Payer: Medicare Other | Admitting: *Deleted

## 2018-12-18 DIAGNOSIS — I639 Cerebral infarction, unspecified: Secondary | ICD-10-CM | POA: Diagnosis not present

## 2018-12-18 NOTE — Telephone Encounter (Signed)
Sent to the pharmacy by e-scribe. 

## 2018-12-19 LAB — CUP PACEART REMOTE DEVICE CHECK
Date Time Interrogation Session: 20200616234123
Implantable Pulse Generator Implant Date: 20190108

## 2018-12-20 ENCOUNTER — Other Ambulatory Visit: Payer: Medicare Other

## 2018-12-27 NOTE — Progress Notes (Signed)
[facility] Summary Report / Loop Recorder 

## 2019-01-03 ENCOUNTER — Other Ambulatory Visit: Payer: Self-pay

## 2019-01-03 ENCOUNTER — Encounter: Payer: Self-pay | Admitting: Adult Health

## 2019-01-03 ENCOUNTER — Ambulatory Visit (INDEPENDENT_AMBULATORY_CARE_PROVIDER_SITE_OTHER): Payer: Medicare Other | Admitting: Adult Health

## 2019-01-03 VITALS — BP 174/86 | Temp 98.5°F | Ht 72.5 in | Wt 221.0 lb

## 2019-01-03 DIAGNOSIS — D509 Iron deficiency anemia, unspecified: Secondary | ICD-10-CM | POA: Diagnosis not present

## 2019-01-03 DIAGNOSIS — Z125 Encounter for screening for malignant neoplasm of prostate: Secondary | ICD-10-CM

## 2019-01-03 DIAGNOSIS — E669 Obesity, unspecified: Secondary | ICD-10-CM | POA: Diagnosis not present

## 2019-01-03 DIAGNOSIS — E1169 Type 2 diabetes mellitus with other specified complication: Secondary | ICD-10-CM

## 2019-01-03 DIAGNOSIS — E782 Mixed hyperlipidemia: Secondary | ICD-10-CM

## 2019-01-03 DIAGNOSIS — Z23 Encounter for immunization: Secondary | ICD-10-CM

## 2019-01-03 DIAGNOSIS — Z1159 Encounter for screening for other viral diseases: Secondary | ICD-10-CM

## 2019-01-03 DIAGNOSIS — I129 Hypertensive chronic kidney disease with stage 1 through stage 4 chronic kidney disease, or unspecified chronic kidney disease: Secondary | ICD-10-CM | POA: Diagnosis not present

## 2019-01-03 DIAGNOSIS — I63512 Cerebral infarction due to unspecified occlusion or stenosis of left middle cerebral artery: Secondary | ICD-10-CM

## 2019-01-03 DIAGNOSIS — R5383 Other fatigue: Secondary | ICD-10-CM

## 2019-01-03 DIAGNOSIS — Z0000 Encounter for general adult medical examination without abnormal findings: Secondary | ICD-10-CM

## 2019-01-03 LAB — CBC WITH DIFFERENTIAL/PLATELET
Basophils Absolute: 0.1 10*3/uL (ref 0.0–0.1)
Basophils Relative: 0.8 % (ref 0.0–3.0)
Eosinophils Absolute: 0.5 10*3/uL (ref 0.0–0.7)
Eosinophils Relative: 7.4 % — ABNORMAL HIGH (ref 0.0–5.0)
HCT: 29.2 % — ABNORMAL LOW (ref 39.0–52.0)
Hemoglobin: 9.4 g/dL — ABNORMAL LOW (ref 13.0–17.0)
Lymphocytes Relative: 25.9 % (ref 12.0–46.0)
Lymphs Abs: 1.8 10*3/uL (ref 0.7–4.0)
MCHC: 32.2 g/dL (ref 30.0–36.0)
MCV: 77.1 fl — ABNORMAL LOW (ref 78.0–100.0)
Monocytes Absolute: 0.5 10*3/uL (ref 0.1–1.0)
Monocytes Relative: 6.6 % (ref 3.0–12.0)
Neutro Abs: 4.1 10*3/uL (ref 1.4–7.7)
Neutrophils Relative %: 59.3 % (ref 43.0–77.0)
Platelets: 303 10*3/uL (ref 150.0–400.0)
RBC: 3.79 Mil/uL — ABNORMAL LOW (ref 4.22–5.81)
RDW: 15.8 % — ABNORMAL HIGH (ref 11.5–15.5)
WBC: 6.8 10*3/uL (ref 4.0–10.5)

## 2019-01-03 LAB — COMPREHENSIVE METABOLIC PANEL
ALT: 10 U/L (ref 0–53)
AST: 10 U/L (ref 0–37)
Albumin: 4.3 g/dL (ref 3.5–5.2)
Alkaline Phosphatase: 54 U/L (ref 39–117)
BUN: 46 mg/dL — ABNORMAL HIGH (ref 6–23)
CO2: 23 mEq/L (ref 19–32)
Calcium: 9 mg/dL (ref 8.4–10.5)
Chloride: 113 mEq/L — ABNORMAL HIGH (ref 96–112)
Creatinine, Ser: 2.7 mg/dL — ABNORMAL HIGH (ref 0.40–1.50)
GFR: 28.24 mL/min — ABNORMAL LOW (ref >60.00)
Glucose, Bld: 119 mg/dL — ABNORMAL HIGH (ref 70–99)
Potassium: 4.5 mEq/L (ref 3.5–5.1)
Sodium: 140 mEq/L (ref 135–145)
Total Bilirubin: 0.3 mg/dL (ref 0.2–1.2)
Total Protein: 7 g/dL (ref 6.0–8.3)

## 2019-01-03 LAB — TSH: TSH: 2.12 u[IU]/mL (ref 0.35–4.50)

## 2019-01-03 LAB — LIPID PANEL
Cholesterol: 162 mg/dL (ref 0–200)
HDL: 44.6 mg/dL (ref >39.00)
LDL Cholesterol: 99 mg/dL (ref 0–99)
NonHDL: 116.97
Total CHOL/HDL Ratio: 4
Triglycerides: 88 mg/dL (ref 0.0–149.0)
VLDL: 17.6 mg/dL (ref 0.0–40.0)

## 2019-01-03 LAB — PSA: PSA: 0.63 ng/mL (ref 0.10–4.00)

## 2019-01-03 LAB — HEMOGLOBIN A1C: Hgb A1c MFr Bld: 6.4 % (ref 4.6–6.5)

## 2019-01-03 NOTE — Progress Notes (Signed)
Subjective:    Patient ID: [name] [name], male    DOB: 11-15-1945, 73 y.o.   MRN: [id number]  HPI Patient presents for yearly preventative medicine examination. He is a pleasant 73 year old male who  has a past medical history of Anemia, DIABETES MELLITUS, TYPE II (01/02/2007), HYPERLIPIDEMIA (06/18/2007), HYPERTENSION (01/02/2007), RENAL INSUFFICIENCY (02/05/2008), Stroke ([place]), and Stroke ([place]) (2019).  DM- Currently controlled on Starlix 60 mg daily and Lantus 6 units at bedtime. His blood sugar log shows blood sugars between 110-150. No episodes of hypoglycemia  Lab Results  Component Value Date   HGBA1C 6.5 10/16/2018   Hyperlipidemia -intolerant to statins. Is able to take Zetia Lab Results  Component Value Date   CHOL 279 (H) 07/09/2017   HDL 36 (L) 07/09/2017   LDLCALC 212 (H) 07/09/2017   LDLDIRECT 180.7 06/24/2013   TRIG 156 (H) 07/09/2017   CHOLHDL 7.8 07/09/2017   Essential Hypertension -managed by nephrology due to chronic kidney disease. Currently prescribed hydralazine 50 mg 3 times daily, Lotensin 40 mg daily, and labetalol 200 mg 3 times daily.  Pressures are usually elevated in the office, per the blood pressure log blood pressures are very well controlled at home with most readings between 120-130/80's. He denies chest pain or shortness of breath. He does have intermittent episodes of dizziness - this happens more often after he wakes up from a nap. He did not take all of his blood pressure medication today BP Readings from Last 3 Encounters:  01/03/19 (!) 174/86  12/04/18 (!) 195/98  06/19/18 (!) 168/82   H/o of left middle cerebral artery stroke -Followed by  neurology every 6 months.  Facet seem to be resolved.  Does have a loop recorder that has not shown atrial fibrillation thus far.  Fatigue/Anemia -has been an issue for greater than 3 months at this point.  He thought it was due to hypoglycemia and his Lantus was decreased.  Despite not having any hypoglycemic  episodes he continued to be fatigued.  In March his labs showed iron deficiency anemia.  He was referred to gastroenterology at which time he had endoscopy and colonoscopy both of these tests were negative for any acute bleeding colonoscopy did show multiple polyps.  Started on a oral iron supplement.  Despite this he continues to feel fatigued.  Will often take a nap in the middle of the afternoon.  Denies depression.  Appetite is good.  Has not experienced any nausea vomiting or diarrhea  All immunizations and health maintenance protocols were reviewed with the patient and needed orders were placed. He is due for prevnar 13  Appropriate screening laboratory values were ordered for the patient including screening of hyperlipidemia, renal function and hepatic function.  Medication reconciliation,  past medical history, social history, problem list and allergies were reviewed in detail with the patient  Goals were established with regard to weight loss, exercise, and  diet in compliance with medications. He has not been exercising as much due to fatigue. He continues to eat a heart healthy diet and has a good appetite.   Review of Systems  Constitutional: Positive for fatigue.  HENT: Negative.   Eyes: Negative.   Respiratory: Negative.   Cardiovascular: Negative.   Gastrointestinal: Negative.   Endocrine: Negative.   Genitourinary: Negative.   Musculoskeletal: Negative.   Skin: Negative.   Allergic/Immunologic: Negative.   Neurological: Negative.   Hematological: Negative.   Psychiatric/Behavioral: Negative.   All other systems reviewed and are negative.  Past Medical History:  Diagnosis Date  . Anemia   . DIABETES MELLITUS, TYPE II 01/02/2007  . HYPERLIPIDEMIA 06/18/2007  . HYPERTENSION 01/02/2007  . RENAL INSUFFICIENCY 02/05/2008  . Stroke ([place])   . Stroke [facility]) 2019    Social History   Socioeconomic History  . Marital status: Married    Spouse name: Not on file  . Number of  children: Not on file  . Years of education: Not on file  . Highest education level: Not on file  Occupational History  . Not on file  Social Needs  . Financial resource strain: Not on file  . Food insecurity    Worry: Not on file    Inability: Not on file  . Transportation needs    Medical: Not on file    Non-medical: Not on file  Tobacco Use  . Smoking status: Never Smoker  . Smokeless tobacco: Never Used  Substance and Sexual Activity  . Alcohol use: No  . Drug use: No  . Sexual activity: Not on file  Lifestyle  . Physical activity    Days per week: Not on file    Minutes per session: Not on file  . Stress: Not on file  Relationships  . Social Herbalist on phone: Not on file    Gets together: Not on file    Attends religious service: Not on file    Active member of club or organization: Not on file    Attends meetings of clubs or organizations: Not on file    Relationship status: Not on file  . Intimate partner violence    Fear of current or ex partner: Not on file    Emotionally abused: Not on file    Physically abused: Not on file    Forced sexual activity: Not on file  Other Topics Concern  . 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 Likes to go to football games.     Past Surgical History:  Procedure Laterality Date  . LOOP RECORDER INSERTION N/A 07/11/2017   Procedure: LOOP RECORDER INSERTION;  Surgeon: Thompson Grayer, MD;  Location: [place] [facility];  Service: Cardiovascular;  Laterality: N/A;  . SHOULDER SURGERY     left  . TEE WITHOUT CARDIOVERSION N/A 07/11/2017   Procedure: TRANSESOPHAGEAL ECHOCARDIOGRAM (TEE);  Surgeon: Dorothy Spark, MD;  Location[facility] ENDOSCOPY;  Service: Cardiovascular;  Laterality: N/A;  . TRANSURETHRAL RESECTION OF PROSTATE     history of retention/hematuria      Family History  Problem Relation Age of Onset  . Hypertension Mother   . Diabetes Mother   . Stroke Father   . Stroke Maternal Grandmother   . Colon cancer Neg Hx   . Esophageal cancer Neg Hx   . Rectal cancer Neg Hx   . Stomach cancer Neg Hx     Allergies  Allergen Reactions  . Repatha [Evolocumab] Hypertension  . Bactrim [Sulfamethoxazole-Trimethoprim] Hives, Itching and Swelling  . Penicillins     Has patient had a PCN reaction causing immediate rash, facial/tongue/throat swelling, SOB or lightheadedness with hypotension: Unk Has patient had a PCN reaction causing severe rash involving mucus membranes or skin necrosis: Unk Has patient had a PCN reaction that required hospitalization: Unk Has patient had a PCN  reaction occurring within the last 10 years: No If all of the above answers are "NO", then may proceed with Cephalosporin use.  . Statins     myalgia  . Sulfa Drugs Cross Reactors Hives, Itching and Swelling    Current Outpatient Medications on File Prior to Visit  Medication Sig Dispense Refill  . benazepril (LOTENSIN) 40 MG tablet TAKE 1 TABLET(40 MG) BY MOUTH TWICE DAILY 60 tablet 0  . calcitRIOL (ROCALTROL) 0.25 MCG capsule Take 1 capsule by mouth daily.     Marland Kitchen ezetimibe (ZETIA) 10 MG tablet TAKE 1 TABLET(10 MG) BY MOUTH DAILY 90 tablet 3  . Ferrous Gluconate-C-Folic Acid (IRON-C PO) Take by mouth.    . ferrous sulfate 325 (65 FE) MG tablet Take 65 mg by mouth daily.    Marland Kitchen glucose blood test strip CHECK BLOOD SUGARS TWICE DAILY 200 each 3  . hydrALAZINE (APRESOLINE) 50 MG tablet TAKE 1 TABLET BY MOUTH THREE TIMES DAILY 270 tablet 0  . insulin glargine (LANTUS) 100 UNIT/ML injection Inject 0.06 mLs (6 Units total) into the skin at bedtime. 10 mL 1  . Insulin Pen Needle 31G X 5 MM MISC 6 Units by Does not apply route at bedtime. 100 each 0  . labetalol (NORMODYNE) 200 MG tablet TAKE 1 TABLET(200 MG) BY MOUTH THREE TIMES DAILY 270 tablet 0  . nateglinide (STARLIX) 60 MG  tablet Take 60 mg by mouth daily.    . NON FORMULARY      No current facility-administered medications on file prior to visit.     BP (!) 174/86   Temp 98.5 F (36.9 C)   Ht 6' 0.5" (1.842 m)   Wt 221 lb (100.2 kg)   BMI 29.56 kg/m       Objective:   Physical Exam Vitals signs and nursing note reviewed.  Constitutional:      General: He is not in acute distress.    Appearance: Normal appearance. He is well-developed and normal weight. He is not ill-appearing or diaphoretic.  HENT:     Head: Normocephalic and atraumatic.     Right Ear: Tympanic membrane, ear canal and external ear normal. There is no impacted cerumen.     Left Ear: Tympanic membrane, ear canal and external ear normal. There is no impacted cerumen.     Nose: Nose normal. No congestion or rhinorrhea.     Mouth/Throat:     Mouth: Mucous membranes are moist.     Pharynx: Oropharynx is clear. No oropharyngeal exudate or posterior oropharyngeal erythema.  Eyes:     General:        Right eye: No discharge.        Left eye: No discharge.     Conjunctiva/sclera: Conjunctivae normal.     Pupils: Pupils are equal, round, and reactive to light.  Neck:     Thyroid: No thyromegaly.     Trachea: No tracheal deviation.  Cardiovascular:     Rate and Rhythm: Normal rate and regular rhythm.     Pulses: Normal pulses.     Heart sounds: Normal heart sounds. No murmur. No friction rub. No gallop.   Pulmonary:     Effort: Pulmonary effort is normal. No respiratory distress.     Breath sounds: Normal breath sounds. No stridor. No wheezing, rhonchi or rales.  Chest:     Chest wall: No tenderness.  Abdominal:     General: Abdomen is flat. Bowel sounds are normal. There is no distension.  Palpations: Abdomen is soft. There is no mass.     Tenderness: There is no abdominal tenderness. There is no right CVA tenderness, left CVA tenderness, guarding or rebound.     Hernia: No hernia is present.  Musculoskeletal: Normal range  of motion.        General: No swelling, tenderness, deformity or signs of injury.     Right lower leg: No edema.     Left lower leg: No edema.  Lymphadenopathy:     Cervical: No cervical adenopathy.  Skin:    General: Skin is warm and dry.     Capillary Refill: Capillary refill takes less than 2 seconds.     Coloration: Skin is not jaundiced or pale.     Findings: No bruising, erythema, lesion or rash.  Neurological:     General: No focal deficit present.     Mental Status: He is alert and oriented to person, place, and time. Mental status is at baseline.     Cranial Nerves: No cranial nerve deficit.     Coordination: Coordination normal.  Psychiatric:        Mood and Affect: Mood normal.        Behavior: Behavior normal.        Thought Content: Thought content normal.        Judgment: Judgment normal.       Assessment & Plan:  1. Routine general medical examination at a health care facility - Encouraged heart healthy diet and exercise  - CBC with Differential/Platelet - Comprehensive metabolic panel - Hemoglobin A1c - Lipid panel - TSH  2. Diabetes mellitus type 2 in obese ([place]) - Consider dose change of insulin  - Three month follow up  - CBC with Differential/Platelet - Comprehensive metabolic panel - Hemoglobin A1c - Lipid panel - TSH  3. Mixed hyperlipidemia  - CBC with Differential/Platelet - Comprehensive metabolic panel - Hemoglobin A1c - Lipid panel - TSH  4. Hypertensive kidney disease - BP much better controlled at home - Follow up with Nephrology as directed  - CBC with Differential/Platelet - Comprehensive metabolic panel - Hemoglobin A1c - Lipid panel - TSH  5. Left middle cerebral artery stroke [facility]) - Continue follow up with Neurology as directed  6. Need for hepatitis C screening test  - Hep C Antibody  7. Prostate cancer screening  - PSA  8. Iron deficiency anemia, unspecified iron deficiency anemia type  - CBC with  Differential/Platelet - Iron and TIBC  9. Need for Streptococcus pneumoniae vaccination  - Pneumococcal conjugate vaccine 13-valent  10. Fatigue, unspecified type -Possibly related to anemia or beta-blocker. - Encouraged exercise - Good sleep hygiene   Dorothyann Peng, NP

## 2019-01-03 NOTE — Patient Instructions (Signed)
It was great seeing you today!  You have received your second and last pneumonia vaccination   We will follow up with you regarding your blood work   Health Maintenance, Male A healthy lifestyle and preventative care can promote health and wellness.  Maintain regular health, dental, and eye exams.  Eat a healthy diet. Foods like vegetables, fruits, whole grains, low-fat dairy products, and lean protein foods contain the nutrients you need and are low in calories. Decrease your intake of foods high in solid fats, added sugars, and salt. Get information about a proper diet from your health care provider, if necessary.  Regular physical exercise is one of the most important things you can do for your health. Most adults should get at least 150 minutes of moderate-intensity exercise (any activity that increases your heart rate and causes you to sweat) each week. In addition, most adults need muscle-strengthening exercises on 2 or more days a week.   Maintain a healthy weight. The body mass index (BMI) is a screening tool to identify possible weight problems. It provides an estimate of body fat based on height and weight. Your health care provider can find your BMI and can help you achieve or maintain a healthy weight. For males 20 years and older:  A BMI below 18.5 is considered underweight.  A BMI of 18.5 to 24.9 is normal.  A BMI of 25 to 29.9 is considered overweight.  A BMI of 30 and above is considered obese.  Maintain normal blood lipids and cholesterol by exercising and minimizing your intake of saturated fat. Eat a balanced diet with plenty of fruits and vegetables. Blood tests for lipids and cholesterol should begin at age 71 and be repeated every 5 years. If your lipid or cholesterol levels are high, you are over age 10, or you are at high risk for heart disease, you may need your cholesterol levels checked more frequently.Ongoing high lipid and cholesterol levels should be treated  with medicines if diet and exercise are not working.  If you smoke, find out from your health care provider how to quit. If you do not use tobacco, do not start.  Lung cancer screening is recommended for adults aged 3-80 years who are at high risk for developing lung cancer because of a history of smoking. A yearly low-dose CT scan of the lungs is recommended for people who have at least a 30-pack-year history of smoking and are current smokers or have quit within the past 15 years. A pack year of smoking is smoking an average of 1 pack of cigarettes a day for 1 year (for example, a 30-pack-year history of smoking could mean smoking 1 pack a day for 30 years or 2 packs a day for 15 years). Yearly screening should continue until the smoker has stopped smoking for at least 15 years. Yearly screening should be stopped for people who develop a health problem that would prevent them from having lung cancer treatment.  If you choose to drink alcohol, do not have more than 2 drinks per day. One drink is considered to be 12 oz (360 mL) of beer, 5 oz (150 mL) of wine, or 1.5 oz (45 mL) of liquor.  Avoid the use of street drugs. Do not share needles with anyone. Ask for help if you need support or instructions about stopping the use of drugs.  High blood pressure causes heart disease and increases the risk of stroke. High blood pressure is more likely to develop in:  People who have blood pressure in the end of the normal range (100-139/85-89 mm Hg).  People who are overweight or obese.  People who are African American.  If you are 97-54 years of age, have your blood pressure checked every 3-5 years. If you are 52 years of age or older, have your blood pressure checked every year. You should have your blood pressure measured twice--once when you are at a hospital or clinic, and once when you are not at a hospital or clinic. Record the average of the two measurements. To check your blood pressure when you  are not at a hospital or clinic, you can use:  An automated blood pressure machine at a pharmacy.  A home blood pressure monitor.  If you are 90-24 years old, ask your health care provider if you should take aspirin to prevent heart disease.  Diabetes screening involves taking a blood sample to check your fasting blood sugar level. This should be done once every 3 years after age 30 if you are at a normal weight and without risk factors for diabetes. Testing should be considered at a younger age or be carried out more frequently if you are overweight and have at least 1 risk factor for diabetes.  Colorectal cancer can be detected and often prevented. Most routine colorectal cancer screening begins at the age of 69 and continues through age 39. However, your health care provider may recommend screening at an earlier age if you have risk factors for colon cancer. On a yearly basis, your health care provider may provide home test kits to check for hidden blood in the stool. A small camera at the end of a tube may be used to directly examine the colon (sigmoidoscopy or colonoscopy) to detect the earliest forms of colorectal cancer. Talk to your health care provider about this at age 39 when routine screening begins. A direct exam of the colon should be repeated every 5-10 years through age 63, unless early forms of precancerous polyps or small growths are found.  People who are at an increased risk for hepatitis B should be screened for this virus. You are considered at high risk for hepatitis B if:  You were born in a country where hepatitis B occurs often. Talk with your health care provider about which countries are considered high risk.  Your parents were born in a high-risk country and you have not received a shot to protect against hepatitis B (hepatitis B vaccine).  You have HIV or AIDS.  You use needles to inject street drugs.  You live with, or have sex with, someone who has hepatitis  B.  You are a man who has sex with other men (MSM).  You get hemodialysis treatment.  You take certain medicines for conditions like cancer, organ transplantation, and autoimmune conditions.  Hepatitis C blood testing is recommended for all people born from [birth year] through 1964 and any individual with known risk factors for hepatitis C.  Healthy men should no longer receive prostate-specific antigen (PSA) blood tests as part of routine cancer screening. Talk to your health care provider about prostate cancer screening.  Testicular cancer screening is not recommended for adolescents or adult males who have no symptoms. Screening includes self-exam, a health care provider exam, and other screening tests. Consult with your health care provider about any symptoms you have or any concerns you have about testicular cancer.  Practice safe sex. Use condoms and avoid high-risk sexual practices to reduce the spread of  sexually transmitted infections (STIs).  You should be screened for STIs, including gonorrhea and chlamydia if:  You are sexually active and are younger than 24 years.  You are older than 24 years, and your health care provider tells you that you are at risk for this type of infection.  Your sexual activity has changed since you were last screened, and you are at an increased risk for chlamydia or gonorrhea. Ask your health care provider if you are at risk.  If you are at risk of being infected with HIV, it is recommended that you take a prescription medicine daily to prevent HIV infection. This is called pre-exposure prophylaxis (PrEP). You are considered at risk if:  You are a man who has sex with other men (MSM).  You are a heterosexual man who is sexually active with multiple partners.  You take drugs by injection.  You are sexually active with a partner who has HIV.  Talk with your health care provider about whether you are at high risk of being infected with HIV. If you  choose to begin PrEP, you should first be tested for HIV. You should then be tested every 3 months for as long as you are taking PrEP.  Use sunscreen. Apply sunscreen liberally and repeatedly throughout the day. You should seek shade when your shadow is shorter than you. Protect yourself by wearing long sleeves, pants, a wide-brimmed hat, and sunglasses year round whenever you are outdoors.  Tell your health care provider of new moles or changes in moles, especially if there is a change in shape or color. Also, tell your health care provider if a mole is larger than the size of a pencil eraser.  A one-time screening for abdominal aortic aneurysm (AAA) and surgical repair of large AAAs by ultrasound is recommended for men aged 71-75 years who are current or former smokers.  Stay current with your vaccines (immunizations).   This information is not intended to replace advice given to you by your health care provider. Make sure you discuss any questions you have with your health care provider.   Document Released: 12/17/2007 Document Revised: 07/11/2014 Document Reviewed: 11/15/2010 Elsevier Interactive Patient Education [employer].

## 2019-01-07 LAB — HEPATITIS C ANTIBODY
Hepatitis C Ab: NONREACTIVE
SIGNAL TO CUT-OFF: 0.02 (ref <1.00)

## 2019-01-07 LAB — IRON, TOTAL/TOTAL IRON BINDING CAP
%SAT: 8 % (calc) — ABNORMAL LOW (ref 20–48)
Iron: 29 ug/dL — ABNORMAL LOW (ref 50–180)
TIBC: 362 mcg/dL (calc) (ref 250–425)

## 2019-01-08 ENCOUNTER — Ambulatory Visit: Payer: Self-pay | Admitting: Adult Health

## 2019-01-08 ENCOUNTER — Other Ambulatory Visit: Payer: Self-pay | Admitting: Adult Health

## 2019-01-08 NOTE — Telephone Encounter (Signed)
Pts contact [name] calling with medication questions. States at last physical she was not able to attend visit with pt. States pt given list of medications and 2 meds were not included; Aspirin and Vit D. Questioning if pt is supposed to continue these meds.  Also new on list is IRON-CPO "Take by mounth." Asking if that is new medication pt should be taking along with the ferrous sulfate 325 he already takes.   Please clarify: [id number]  Reason for Disposition . Caller has NON-URGENT medication question about med that PCP prescribed and triager unable to answer question  Answer Assessment - Initial Assessment Questions 1. SYMPTOMS: "Do you have any symptoms?"     no 2. SEVERITY: If symptoms are present, ask "Are they mild, moderate or severe?"     *No Answer*  Protocols used: MEDICATION QUESTION CALL-A-AH

## 2019-01-09 NOTE — Telephone Encounter (Signed)
Spoke with the patients wife. She has been made aware of [name]'s note below.

## 2019-01-09 NOTE — Telephone Encounter (Signed)
Sent to the pharmacy by e-scribe. 

## 2019-01-09 NOTE — Telephone Encounter (Signed)
Yes continue with vitamin D and ASA.   The Iron supplement " Iron C- PO" is iron and vitamin C combination pill. If this is what he is taking then continue with that. If he is taking regular iron supplement without vitamin C then it would benefit him to switch to the one with Vitamin C so that the iron gets absorbed better.

## 2019-01-12 ENCOUNTER — Other Ambulatory Visit: Payer: Self-pay | Admitting: Adult Health

## 2019-01-15 NOTE — Telephone Encounter (Signed)
Sent to the pharmacy by e-scribe. 

## 2019-01-21 ENCOUNTER — Ambulatory Visit (INDEPENDENT_AMBULATORY_CARE_PROVIDER_SITE_OTHER): Payer: Medicare Other | Admitting: *Deleted

## 2019-01-21 ENCOUNTER — Encounter (INDEPENDENT_AMBULATORY_CARE_PROVIDER_SITE_OTHER): Payer: Medicare Other | Admitting: Ophthalmology

## 2019-01-21 ENCOUNTER — Telehealth: Payer: Self-pay | Admitting: Gastroenterology

## 2019-01-21 DIAGNOSIS — D508 Other iron deficiency anemias: Secondary | ICD-10-CM

## 2019-01-21 DIAGNOSIS — I639 Cerebral infarction, unspecified: Secondary | ICD-10-CM | POA: Diagnosis not present

## 2019-01-21 LAB — CUP PACEART REMOTE DEVICE CHECK
Date Time Interrogation Session: 20200720000736
Implantable Pulse Generator Implant Date: 20190108

## 2019-01-21 NOTE — Telephone Encounter (Signed)
The pt was seen 12/2018 and was scheduled for CT and chest xray for IDA.  Appointments were cancelled due to pt declining to have anything else done at the time.  His wife is now calling saying he is ready to schedule.  I have placed the order for Chest Xray and CT.    You have been scheduled for a CT scan of the abdomen and pelvis at [facility] ([address]---this is in the same building as Press photographer).   You are scheduled on 01/28/19 at 1030 am. You should arrive 15 minutes prior to your appointment time for registration. Please follow the written instructions below on the day of your exam:  WARNING: IF YOU ARE ALLERGIC TO IODINE/X-RAY DYE, PLEASE NOTIFY RADIOLOGY IMMEDIATELY AT [phone]! YOU WILL BE GIVEN A 13 HOUR PREMEDICATION PREP.  1) Do not eat or drink anything after 630 am (4 hours prior to your test) 2) You have been given 2 bottles of oral contrast to drink. The solution may taste better if refrigerated, but do NOT add ice or any other liquid to this solution. Shake well before drinking.    Drink 1 bottle of contrast @ 830 am (2 hours prior to your exam)  Drink 1 bottle of contrast @ 930 am (1 hour prior to your exam)  You may take any medications as prescribed with a small amount of water, if necessary. If you take any of the following medications: METFORMIN, GLUCOPHAGE, GLUCOVANCE, AVANDAMET, RIOMET, FORTAMET, [place] MET, JANUMET, GLUMETZA or METAGLIP, you MAY be asked to HOLD this medication 48 hours AFTER the exam.  The purpose of you drinking the oral contrast is to aid in the visualization of your intestinal tract. The contrast solution may cause some diarrhea. Depending on your individual set of symptoms, you may also receive an intravenous injection of x-ray contrast/dye. Plan on being at [facility] for 30 minutes or longer, depending on the type of exam you are having performed.  This test typically takes 30-45 minutes to complete.  If  you have any questions regarding your exam or if you need to reschedule, you may call the CT department at [phone] between the hours of 8:00 am and 5:00 pm, Monday-Friday.  ________________________________________________________________________  The pt has been advised and instructed and he will come in this week for xray and pick up contrast and instructions for CT.

## 2019-01-21 NOTE — Telephone Encounter (Signed)
Pt's wife calling to inform that pt saw his nephrologist today Dr. Willene Hatchet. He referred pt back for more testing on the small intestinal area. His office number is [phone]. Pt's wife wants to know what test he can have. Pls call her.

## 2019-01-22 ENCOUNTER — Other Ambulatory Visit: Payer: Self-pay | Admitting: Adult Health

## 2019-01-22 DIAGNOSIS — I129 Hypertensive chronic kidney disease with stage 1 through stage 4 chronic kidney disease, or unspecified chronic kidney disease: Secondary | ICD-10-CM

## 2019-01-23 ENCOUNTER — Encounter (INDEPENDENT_AMBULATORY_CARE_PROVIDER_SITE_OTHER): Payer: Medicare Other | Admitting: Ophthalmology

## 2019-01-23 ENCOUNTER — Other Ambulatory Visit: Payer: Self-pay

## 2019-01-23 DIAGNOSIS — E11319 Type 2 diabetes mellitus with unspecified diabetic retinopathy without macular edema: Secondary | ICD-10-CM

## 2019-01-23 DIAGNOSIS — H33301 Unspecified retinal break, right eye: Secondary | ICD-10-CM

## 2019-01-23 DIAGNOSIS — E113293 Type 2 diabetes mellitus with mild nonproliferative diabetic retinopathy without macular edema, bilateral: Secondary | ICD-10-CM

## 2019-01-23 DIAGNOSIS — H35033 Hypertensive retinopathy, bilateral: Secondary | ICD-10-CM

## 2019-01-23 DIAGNOSIS — H2513 Age-related nuclear cataract, bilateral: Secondary | ICD-10-CM

## 2019-01-23 DIAGNOSIS — I10 Essential (primary) hypertension: Secondary | ICD-10-CM

## 2019-01-23 NOTE — Telephone Encounter (Signed)
Sent to the pharmacy by e-scribe. 

## 2019-01-24 ENCOUNTER — Telehealth: Payer: Self-pay | Admitting: *Deleted

## 2019-01-24 NOTE — Telephone Encounter (Signed)
Spoke to [name] and advised that A1C was 6.4 on 01/03/2019.  Nothing further needed.

## 2019-01-24 NOTE — Telephone Encounter (Signed)
Copied from [place] [id number]. Topic: General - Other >> Jan 24, 2019  1:07 PM Marin Olp L wrote: Reason for CRM: Patient's wife [name] calling to request patients last A1C result. This is needed before he can do his upcoming retina surgery.

## 2019-01-25 ENCOUNTER — Other Ambulatory Visit: Payer: Self-pay

## 2019-01-25 ENCOUNTER — Telehealth: Payer: Self-pay | Admitting: Gastroenterology

## 2019-01-25 ENCOUNTER — Ambulatory Visit (INDEPENDENT_AMBULATORY_CARE_PROVIDER_SITE_OTHER)
Admission: RE | Admit: 2019-01-25 | Discharge: 2019-01-25 | Disposition: A | Discharge status: Home / Self Care | Payer: Medicare Other | Source: Ambulatory Visit | Attending: Gastroenterology | Admitting: Gastroenterology

## 2019-01-25 DIAGNOSIS — D508 Other iron deficiency anemias: Secondary | ICD-10-CM | POA: Diagnosis not present

## 2019-01-25 NOTE — Telephone Encounter (Signed)
Script Screening patients for COVID-19 and reviewing new operational procedures  Greeting - The reason I am calling is to share with you some new changes to our processes that are designed to help [place] keep everyone safe. Is now a good time to speak with you? Patient says "no' - ask them when you can call back and let them know it's important to do this prior to their appointment.  Patient says "yes" - stephane, niemann) the first thing I need to do is ask you some screening Questions.  1. To the best of your knowledge, have you been in close contact with any one with a confirmed diagnosis of COVID 19? o No - proceed to next question  2. Have you had any one or more of the following: fever, chills, cough, shortness of breath or any flu-like symptoms? o No - proceed to next question  3. Have you been diagnosed with or have a previous diagnosis of COVID 19? o No - proceed to next question  4. I am going to go over a few other symptoms with you. Please let me know if you are experiencing any of the following: . Ear, nose or throat discomfort . A sore throat . Headache . Muscle pain . Diarrhea . Loss of taste or smell o No - proceed to next question  Thank you for answering these questions. Please know we will ask you these questions or similar questions when you arrive for your appointment and again it's how we are keeping everyone safe. Also, to keep you safe, please use the provided hand sanitizer when you enter the building. (Insert pt name), we are asking everyone in the building to wear a mask because they help [place] prevent the spread of germs. Do you have a mask of your own, if not, we are happy to provide one for you. The last thing I want to go over with you is the no visitor guidelines. This means no one can attend the appointment with you unless you need physical assistance. I understand this may be different from your past appointments and I know this may be difficult but please  know if someone is driving you we are happy to call them for you once your appointment is over.  [INSERT [place]  (Insert pt name) I've given you a lot of information, what questions do you have about what I've talked about today or your appointment tomorrow?

## 2019-01-28 ENCOUNTER — Ambulatory Visit (INDEPENDENT_AMBULATORY_CARE_PROVIDER_SITE_OTHER)
Admission: RE | Admit: 2019-01-28 | Discharge: 2019-01-28 | Disposition: A | Discharge status: Home / Self Care | Payer: Medicare Other | Source: Ambulatory Visit | Attending: Gastroenterology | Admitting: Gastroenterology

## 2019-01-28 ENCOUNTER — Other Ambulatory Visit: Payer: Self-pay

## 2019-01-28 DIAGNOSIS — D509 Iron deficiency anemia, unspecified: Secondary | ICD-10-CM

## 2019-01-28 DIAGNOSIS — D123 Benign neoplasm of transverse colon: Secondary | ICD-10-CM | POA: Diagnosis not present

## 2019-01-28 DIAGNOSIS — D124 Benign neoplasm of descending colon: Secondary | ICD-10-CM | POA: Diagnosis not present

## 2019-01-28 DIAGNOSIS — D508 Other iron deficiency anemias: Secondary | ICD-10-CM

## 2019-01-29 ENCOUNTER — Telehealth: Payer: Self-pay

## 2019-01-29 DIAGNOSIS — R935 Abnormal findings on diagnostic imaging of other abdominal regions, including retroperitoneum: Secondary | ICD-10-CM

## 2019-01-29 NOTE — Telephone Encounter (Signed)
-----   Message from Milus Banister, MD sent at 01/29/2019  8:46 AM EDT -----   Please call the patient.  There are 2 areas of concern on his CT scan.  I am not sure either of them explain his iron deficiency anemia however both need further testing.  The first is a area in his liver that looks a bit abnormal.  Not obviously a cancer but definitely needs further imaging with MRI of the liver.  The second area of concern on his CT scan is his bladder and left kidney.  Again this is not obviously a cancer but it definitely needs further input with a referral to urology.  I see his primary care physician has gotten him started on IV iron infusions and is setting up for him to meet a hematologist.  I agree with those recommendations.

## 2019-01-29 NOTE — Telephone Encounter (Signed)
Left message on machine to call back  

## 2019-01-30 ENCOUNTER — Encounter: Payer: Self-pay | Admitting: Family Medicine

## 2019-01-30 NOTE — Telephone Encounter (Signed)
You have been scheduled for an MRI at [facility] on 02/06/19. Your appointment time is 12 noon. Please arrive 15 minutes prior to your appointment time for registration purposes. Please make certain not to have anything to eat or drink 6 hours prior to your test. In addition, if you have any metal in your body, have a pacemaker or defibrillator, please be sure to let your ordering physician know. This test typically takes 45 minutes to 1 hour to complete. Should you need to reschedule, please call [phone] to do so.  Referral made to Dr Carlota Raspberry at [facility] faxed   The pt has been advised of the information and verbalized understanding.

## 2019-01-30 NOTE — Telephone Encounter (Signed)
MRI Liver and urology referral

## 2019-02-04 ENCOUNTER — Ambulatory Visit: Payer: Self-pay | Admitting: *Deleted

## 2019-02-04 NOTE — Telephone Encounter (Signed)
Wife calling this am with concerns pt has been dizzy over the weekend. Their air conditioning is out (since 7/23) and she states it is very hot in their home.  Pt pulled down the towel rack last night. She states she is resting now. But pt has an appt tomorrow to have for a retina issue and she is concerned pt may not need to keep this appt.  Pt has called the eye dr, but they are not open yet, and their office was even closed last week.  Not sure if this dizzy is coming from being in the heat, or something else.  Please call the wife.   Call to wife- after triage questions- ED is recommended- patient has not improved over the weekend. She will take him.   Reason for Disposition . SEVERE dizziness (e.g., unable to stand, requires support to walk, feels like passing out now)  Answer Assessment - Initial Assessment Questions 1. DESCRIPTION: "Describe your dizziness."     Lighted and off balance 2. LIGHTHEADED: "Do you feel lightheaded?" (e.g., somewhat faint, woozy, weak upon standing)     Somewhat faint, weak with standing 3. VERTIGO: "Do you feel like either you or the room is spinning or tilting?" (i.e. vertigo)     n/a 4. SEVERITY: "How bad is it?"  "Do you feel like you are going to faint?" "Can you stand and walk?"   - MILD - walking normally   - MODERATE - interferes with normal activities (e.g., work, school)    - SEVERE - unable to stand, requires support to walk, feels like passing out now.      severe 5. ONSET:  "When did the dizziness begin?"     Saturday  6. AGGRAVATING FACTORS: "Does anything make it worse?" (e.g., standing, change in head position)     heat 7. HEART RATE: "Can you tell me your heart rate?" "How many beats in 15 seconds?"  (Note: not all patients can do this)       BP has been all over the place- today- 117/60 P 65  Glucose 123 8. CAUSE: "What do you think is causing the dizziness?"     Possible BP- 101/49 P 54 yesterday 9. RECURRENT SYMPTOM: "Have you had  dizziness before?" If so, ask: "When was the last time?" "What happened that time?"     Yes- BP/glucose off- too low- medication adjustment 10. OTHER SYMPTOMS: "Do you have any other symptoms?" (e.g., fever, chest pain, vomiting, diarrhea, bleeding)       no 11. PREGNANCY: "Is there any chance you are pregnant?" "When was your last menstrual period?"       n/a  Protocols used: DIZZINESS [facility]

## 2019-02-04 NOTE — Telephone Encounter (Signed)
Will monitor for ED arrival.  

## 2019-02-04 NOTE — Progress Notes (Signed)
[facility] Summary Report / Loop Recorder 

## 2019-02-05 ENCOUNTER — Encounter (INDEPENDENT_AMBULATORY_CARE_PROVIDER_SITE_OTHER): Payer: Medicare Other | Admitting: Ophthalmology

## 2019-02-05 ENCOUNTER — Other Ambulatory Visit: Payer: Self-pay

## 2019-02-05 DIAGNOSIS — H33301 Unspecified retinal break, right eye: Secondary | ICD-10-CM

## 2019-02-05 DIAGNOSIS — H34212 Partial retinal artery occlusion, left eye: Secondary | ICD-10-CM | POA: Diagnosis not present

## 2019-02-05 DIAGNOSIS — E11319 Type 2 diabetes mellitus with unspecified diabetic retinopathy without macular edema: Secondary | ICD-10-CM | POA: Diagnosis not present

## 2019-02-05 DIAGNOSIS — E113293 Type 2 diabetes mellitus with mild nonproliferative diabetic retinopathy without macular edema, bilateral: Secondary | ICD-10-CM | POA: Diagnosis not present

## 2019-02-05 NOTE — Telephone Encounter (Signed)
[name] [name] is aware. 

## 2019-02-06 ENCOUNTER — Other Ambulatory Visit (HOSPITAL_COMMUNITY): Payer: Medicare Other

## 2019-02-06 ENCOUNTER — Telehealth: Payer: Self-pay | Admitting: Family Medicine

## 2019-02-06 NOTE — Telephone Encounter (Signed)
Spoke to patient's wife.  She wanted to know if [name] spoke to Tempie Hoist from [facility].  He was suppose to call on 02/05/2019.  Pt had a hole in the retina of his right eye.  Something was seen in the left eye that was concerning.  [name] couldn't remember the name of what he called it.   Dr. Zigmund Daniel worried that pt may be getting ready to have another stroke.  Wife reports pt's balance has been off.  Dr. Zigmund Daniel would like to speak to [facility] and the cardiologist.

## 2019-02-06 NOTE — Telephone Encounter (Signed)
Copied from [place] [phone]. Topic: Quick Communication - See Telephone Encounter >> Feb 05, 2019  9:30 AM Loma [place] wrote: CRM for notification. See Telephone encounter for: 02/05/19. [name], pt wife wants [name]'s nurse to give her a call at her convenience to touch base about some of Jairus's test results and some upcoming perocedures.

## 2019-02-07 ENCOUNTER — Ambulatory Visit (INDEPENDENT_AMBULATORY_CARE_PROVIDER_SITE_OTHER): Payer: Medicare Other | Admitting: Adult Health

## 2019-02-07 ENCOUNTER — Other Ambulatory Visit: Payer: Self-pay

## 2019-02-07 ENCOUNTER — Telehealth: Payer: Self-pay

## 2019-02-07 ENCOUNTER — Encounter: Payer: Self-pay | Admitting: Adult Health

## 2019-02-07 VITALS — BP 136/72 | Temp 98.8°F | Wt 221.0 lb

## 2019-02-07 DIAGNOSIS — I952 Hypotension due to drugs: Secondary | ICD-10-CM

## 2019-02-07 NOTE — Telephone Encounter (Signed)
Dr Ardis Hughs the imaging center called and states that the pt can not have MRI with contrast due to GFr of 28.  Do you want the pt to have without contrast?  His appt is tomorrow.  Please advise

## 2019-02-08 ENCOUNTER — Other Ambulatory Visit: Payer: Self-pay

## 2019-02-08 ENCOUNTER — Ambulatory Visit (HOSPITAL_COMMUNITY)
Admission: RE | Admit: 2019-02-08 | Discharge: 2019-02-08 | Disposition: A | Discharge status: Home / Self Care | Payer: Medicare Other | Source: Ambulatory Visit | Attending: Gastroenterology | Admitting: Gastroenterology

## 2019-02-08 ENCOUNTER — Other Ambulatory Visit: Payer: Self-pay | Admitting: Gastroenterology

## 2019-02-08 DIAGNOSIS — R935 Abnormal findings on diagnostic imaging of other abdominal regions, including retroperitoneum: Secondary | ICD-10-CM | POA: Diagnosis present

## 2019-02-08 NOTE — Progress Notes (Signed)
Subjective:    Patient ID: [name] [name], male    DOB: 03/11/1946, 73 y.o.   MRN: [id number]  HPI  73 year old male who  has a past medical history of Anemia, DIABETES MELLITUS, TYPE II (01/02/2007), HYPERLIPIDEMIA (06/18/2007), HYPERTENSION (01/02/2007), RENAL INSUFFICIENCY (02/05/2008), Stroke ([place]), and Stroke ([place]) (2019).  He presents with his wife today for an acute issue of gait instability and dizziness over the last week.  He reports starting 4 days ago he started to feel dizzy, especially with changes in position, felt as though his gait was off, and noted intermittent right arm spasms.  Over the course of the week he has had multiple spells where his become dizzy and weak, he has had to hold onto various household furniture to keep his balance at times.  While in the bathroom 1 early morning he became dizzy and had to use the towel rack on the wall to steady himself, he ended up breaking this towel rack off the wall.  He denies any falls.  He and his wife chalked this up to not having any air conditioning in the house over the last week, is much is a try to cool down the house consistently was 91 degrees in the house.  They have been checking his blood pressure at home and reports that over the last 2 weeks his blood pressure has been "low".  Wife reports the lowest blood pressure that she is seeing has been in the 14G systolic but for the most part he has been in the high 90s to low 818H systolic.  He was seen by nephrology recently and benazepril was changed to valsartan.  Was noticed today in the office as he was walking to the room he had unsteady gait.  His initial blood pressure was 136/72.  His second blood pressure taken approximately 10 minutes later was 110/80 while sitting, and then 100/60 while standing a few minutes later.  He was symptomatic with dizziness while standing  His wife does endorse that he is staying hydrated and his appetite is good  His wife reports that his  blood sugars have been within normal limits and actually a little bit higher into the 130s to 140s over the last week  Denies headaches, blurred vision, fevers, or chills  Review of Systems See HPI   Past Medical History:  Diagnosis Date  . Anemia   . DIABETES MELLITUS, TYPE II 01/02/2007  . HYPERLIPIDEMIA 06/18/2007  . HYPERTENSION 01/02/2007  . RENAL INSUFFICIENCY 02/05/2008  . Stroke ([place])   . Stroke [facility]) 2019    Social History   Socioeconomic History  . Marital status: Married    Spouse name: Not on file  . Number of children: Not on file  . Years of education: Not on file  . Highest education level: Not on file  Occupational History  . Not on file  Social Needs  . Financial resource strain: Not on file  . Food insecurity    Worry: Not on file    Inability: Not on file  . Transportation needs    Medical: Not on file    Non-medical: Not on file  Tobacco Use  . Smoking status: Never Smoker  . Smokeless tobacco: Never Used  Substance and Sexual Activity  . Alcohol use: No  . Drug use: No  . Sexual activity: Not on file  Lifestyle  . Physical activity    Days per week: Not on file    Minutes per  session: Not on file  . Stress: Not on file  Relationships  . Social Herbalist on phone: Not on file    Gets together: Not on file    Attends religious service: Not on file    Active member of club or organization: Not on file    Attends meetings of clubs or organizations: Not on file    Relationship status: Not on file  . Intimate partner violence    Fear of current or ex partner: Not on file    Emotionally abused: Not on file    Physically abused: Not on file    Forced sexual activity: Not on file  Other Topics Concern  . 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 Likes to go to football games.      Past Surgical History:  Procedure Laterality Date  . LOOP RECORDER INSERTION N/A 07/11/2017   Procedure: LOOP RECORDER INSERTION;  Surgeon: Thompson Grayer, MD;  Location: [place] [facility];  Service: Cardiovascular;  Laterality: N/A;  . SHOULDER SURGERY     left  . TEE WITHOUT CARDIOVERSION N/A 07/11/2017   Procedure: TRANSESOPHAGEAL ECHOCARDIOGRAM (TEE);  Surgeon: Dorothy Spark, MD;  Location: [facility] ENDOSCOPY;  Service: Cardiovascular;  Laterality: N/A;  . TRANSURETHRAL RESECTION OF PROSTATE     history of retention/hematuria     Family History  Problem Relation Age of Onset  . Hypertension Mother   . Diabetes Mother   . Stroke Father   . Stroke Maternal Grandmother   . Colon cancer Neg Hx   . Esophageal cancer Neg Hx   . Rectal cancer Neg Hx   . Stomach cancer Neg Hx     Allergies  Allergen Reactions  . Repatha [Evolocumab] Hypertension  . Bactrim [Sulfamethoxazole-Trimethoprim] Hives, Itching and Swelling  . Penicillins     Has patient had a PCN reaction causing immediate rash, facial/tongue/throat swelling, SOB or lightheadedness with hypotension: Unk Has patient had a PCN reaction causing severe rash involving mucus membranes or skin necrosis: Unk Has patient had a PCN reaction that required hospitalization: Unk Has patient had a PCN reaction occurring within the last 10 years: No If all of the above answers are "NO", then may proceed with Cephalosporin use.  . Statins     myalgia  . Sulfa Drugs Cross Reactors Hives, Itching and Swelling    Current Outpatient Medications on File Prior to Visit  Medication Sig Dispense Refill  . benazepril (LOTENSIN) 40 MG tablet TAKE 1 TABLET(40 MG) BY MOUTH TWICE DAILY 60 tablet 0  . calcitRIOL (ROCALTROL) 0.25 MCG capsule Take 1 capsule by mouth daily.     Marland Kitchen ezetimibe (ZETIA) 10 MG tablet TAKE 1 TABLET(10 MG) BY MOUTH DAILY 90 tablet 3  . Ferrous Gluconate-C-Folic Acid (IRON-C PO) Take by mouth.    . ferrous sulfate 325 (65 FE) MG  tablet Take 65 mg by mouth daily.    Marland Kitchen glucose blood test strip CHECK BLOOD SUGARS TWICE DAILY 200 each 3  . hydrALAZINE (APRESOLINE) 50 MG tablet TAKE 1 TABLET BY MOUTH THREE TIMES DAILY 270 tablet 1  . insulin glargine (LANTUS) 100 UNIT/ML injection Inject 0.06 mLs (6 Units total) into the skin at bedtime. 10 mL 1  . Insulin Pen Needle (B-D UF III  MINI PEN NEEDLES) 31G X 5 MM MISC USE TO TEST BLOOD GLUCOSE FOUR TIMES DAILY 400 each 0  . labetalol (NORMODYNE) 200 MG tablet TAKE 1 TABLET(200 MG) BY MOUTH THREE TIMES DAILY 270 tablet 3  . nateglinide (STARLIX) 60 MG tablet Take 60 mg by mouth daily.    . NON FORMULARY     . valsartan (DIOVAN) 160 MG tablet TK 1 T PO QD     No current facility-administered medications on file prior to visit.     BP 136/72   Temp 98.8 F (37.1 C)   Wt 221 lb (100.2 kg) Comment: REPORTED  BMI 29.56 kg/m       Objective:   Physical Exam Vitals signs and nursing note reviewed.  Constitutional:      Appearance: Normal appearance.  Eyes:     Extraocular Movements: Extraocular movements intact.     Pupils: Pupils are equal, round, and reactive to light.  Cardiovascular:     Rate and Rhythm: Normal rate and regular rhythm.     Pulses: Normal pulses.     Heart sounds: Normal heart sounds.  Pulmonary:     Effort: Pulmonary effort is normal.     Breath sounds: Normal breath sounds.  Musculoskeletal: Normal range of motion.  Skin:    General: Skin is warm and dry.     Capillary Refill: Capillary refill takes less than 2 seconds.  Neurological:     General: No focal deficit present.     Mental Status: He is alert and oriented to person, place, and time. Mental status is at baseline.     Sensory: No sensory deficit.     Coordination: Coordination abnormal.     Gait: Gait abnormal.  Psychiatric:        Mood and Affect: Mood normal.        Behavior: Behavior normal.        Thought Content: Thought content normal.        Judgment: Judgment normal.        Assessment & Plan:  1. Hypotension due to drugs -Symptoms appear to be from hypotension.  We will have him stop hydralazine Tijo he responds over the next week.  Encouraged to stay hydrated.  Air conditioning is now fixed in the home  Dorothyann Peng, NP

## 2019-02-08 NOTE — Telephone Encounter (Signed)
Looks like the pt was done without contrast.  FYI

## 2019-02-12 ENCOUNTER — Telehealth: Payer: Self-pay | Admitting: Adult Health

## 2019-02-12 NOTE — Telephone Encounter (Signed)
Spoke to [name] [name] and informed her of below message.  Also instructed her to use Gatorade Zero as a supplement if pt will drink it.  I will call next week to check up on the pt.  Advised if pt/bp becomes worse than should notify the office immediately.  She agreed and nothing further needed.

## 2019-02-12 NOTE — Telephone Encounter (Signed)
Copied from [place] [phone]. Topic: General - Other >> Feb 12, 2019 11:55 AM Jodie Echevaria wrote: Reason for CRM: Patient wife [name] called to inform Dr [name] that there have not been any change in the patient. She would like to know what is the next step and does she need to do anything different. Can be reached at  Ph# [phone]

## 2019-02-12 NOTE — Telephone Encounter (Signed)
What are his blood pressures?

## 2019-02-12 NOTE — Telephone Encounter (Signed)
I would like him to continue to stay off hydralazine. Make sure he is staying hydrated - push fluids and add some salt to lunch and dinner meals

## 2019-02-12 NOTE — Telephone Encounter (Addendum)
Spoke to [name].  BP this morning was 126/61 Pulse 72 and sugar 151.  Lunchtime he had a tremor 126/63 pulse 64.  When he was standing it was 105/62 pulse 64.  Friday morning was 111/67 and Friday evening was 142/69 so medication was given.  Saturday morning was 111/62 and the evening was 99/48 with tremor.  Sunday was 117/66 (morning) and evening 108/50.  Monday morning was 124/64 and evening 108/54.  [name] reports BP always drops when standing.  Had a standing pressure yesterday of 87/48.  Sitting at the same time was  117/66.

## 2019-02-14 ENCOUNTER — Other Ambulatory Visit: Payer: Self-pay

## 2019-02-14 ENCOUNTER — Encounter (INDEPENDENT_AMBULATORY_CARE_PROVIDER_SITE_OTHER): Payer: Medicare Other | Admitting: Ophthalmology

## 2019-02-14 DIAGNOSIS — H33301 Unspecified retinal break, right eye: Secondary | ICD-10-CM | POA: Diagnosis not present

## 2019-02-15 ENCOUNTER — Other Ambulatory Visit: Payer: Self-pay | Admitting: Adult Health

## 2019-02-15 ENCOUNTER — Telehealth: Payer: Self-pay | Admitting: Gastroenterology

## 2019-02-15 NOTE — Telephone Encounter (Signed)
The pt has been advised that the MRI has not been reviewed and as soon as results are seen we will contact him with recommendations

## 2019-02-19 NOTE — Telephone Encounter (Signed)
This was prescribed by Nephrology

## 2019-02-21 NOTE — Telephone Encounter (Signed)
Left a message for a return call.

## 2019-02-21 NOTE — Telephone Encounter (Signed)
Is BP still running low?

## 2019-02-21 NOTE — Telephone Encounter (Signed)
Left a message for [name] [name] to return my call.

## 2019-02-21 NOTE — Telephone Encounter (Signed)
[name] called back.  Per [facility] send message

## 2019-02-22 ENCOUNTER — Ambulatory Visit (INDEPENDENT_AMBULATORY_CARE_PROVIDER_SITE_OTHER): Payer: Medicare Other | Admitting: *Deleted

## 2019-02-22 DIAGNOSIS — I639 Cerebral infarction, unspecified: Secondary | ICD-10-CM

## 2019-02-22 NOTE — Telephone Encounter (Signed)
Pt returning your call.  Advised Misty with a pt..  Please call Mrs Omri back.

## 2019-02-24 LAB — CUP PACEART REMOTE DEVICE CHECK
Date Time Interrogation Session: 20200822004006
Implantable Pulse Generator Implant Date: 20190108

## 2019-02-26 ENCOUNTER — Other Ambulatory Visit: Payer: Self-pay | Admitting: Adult Health

## 2019-02-26 DIAGNOSIS — E1169 Type 2 diabetes mellitus with other specified complication: Secondary | ICD-10-CM

## 2019-02-26 DIAGNOSIS — I639 Cerebral infarction, unspecified: Secondary | ICD-10-CM

## 2019-02-27 ENCOUNTER — Other Ambulatory Visit: Payer: Self-pay

## 2019-02-27 ENCOUNTER — Encounter: Payer: Self-pay | Admitting: Neurology

## 2019-02-27 ENCOUNTER — Telehealth: Payer: Self-pay | Admitting: Neurology

## 2019-02-27 ENCOUNTER — Ambulatory Visit (INDEPENDENT_AMBULATORY_CARE_PROVIDER_SITE_OTHER): Payer: Medicare Other | Admitting: Neurology

## 2019-02-27 VITALS — BP 171/78 | HR 66 | Temp 97.5°F | Wt 219.0 lb

## 2019-02-27 DIAGNOSIS — R251 Tremor, unspecified: Secondary | ICD-10-CM | POA: Diagnosis not present

## 2019-02-27 NOTE — Patient Instructions (Signed)
I had a long d/w patient and his wife about his remote stroke, risk for recurrent stroke/TIAs, personally independently reviewed imaging studies and stroke evaluation results and answered questions.Continue aspirin 325 mg daily  for secondary stroke prevention and maintain strict control of hypertension with blood pressure goal below 130/90, diabetes with hemoglobin A1c goal below 6.5% and lipids with LDL cholesterol goal below 70 mg/dL. I also advised the patient to eat a healthy diet with plenty of whole grains, cereals, fruits and vegetables, exercise regularly and maintain ideal body weight.  I recommend patient continue follow-up with his primary physician for blood pressure management and avoid hypotension which seem to have precipitated action tremors which are now significantly improved.  Followup in the future with me in 1 year or call earlier if necessary.

## 2019-02-27 NOTE — Progress Notes (Signed)
[facility] [address] [name]. [place] [address] [phone]       OFFICE FOLLOW-UP NOTE  [name] Date of Birth:  [birth year] Medical Record Number:  [id number]   Chief Complaint  Patient presents with   Follow-up    Stroke follow up room 1 pt with wife [name] 97.3     HPI:[name] is a 73 year old pleasant African-American male seen today for the first office follow-up visit following hospital admission for stroke in January 2019. History is obtained from the patient, review of electronic medical records and I have personally reviewed imaging films.[name] Bordersis an 73 y.o.malemalewith medical history significant ofdiabetes mellitus type 2, CKD stage III, hypertension, hyperlipidemiapresented as stroke alert with aphasia. LSN was 2 am according to wife.  When he woke up at 8am,he was confused, speaking only few words. After few hours, EMS was alerted. BP was 371 systolic per EMS. On arrival patient had aphasia and mild right side weakness.Date last known well:1.5.19 Time last known well:2 am.tPA Given:no, outside window. NIHSS:4 Baseline MRS0. CT) showed no significant head large vessel Tetanus stenosis but a lipid-laden plaque in the proximal left ICA. CT perfusion showed no significant cord infarct. MRI scan of the brain showed multifocal areas of infarcts involving left MCA territory left frontal cortex and subcortical region. Transthoracic echo showed normal ejection fraction without cardiac source of embolism. Extracranial Dopplers were unremarkable. Transesophageal echo showed no Source of embolism. Patient had loop recorder inserted follow-up paroxysmal atrial fibrillation and so far it has not yet been found. LDL cholesterol was elevated at 212 mg percent but patient has a history of intolerance to statins and has tried Lipitor and simvastatin the past. He is currently on Zetia. He states his done well since discharge is still  having some occasional speech difficulties particularly when tired or tries to talk too fast. This is tolerating aspirin without bleeding or bruising. His fasting sugars are  pretty good and ranges in the low 100-110 range. His blood pressure normally at home is in the 130 to 140 range but today it is elevated in office at 170/92.   12/15/17 visit: Patient returns today for 39-month follow-up and is accompanied by his wife.  He has completed therapies but continues speech exercises at home such as reading, math word problems, reading comprehension exercises and coloring by numbers.  He continues to take aspirin without side effects of bleeding or bruising continues to take Zetia for cholesterol control.  Patient did take 2 doses of Repatha injections and had side effects such as back pain, overall discomfort, fatigue, blisters, joint discomfort and increased coughing, along with erratic BP and glucose numbers.  After speaking to [place] and PCP, it was advised to discontinue this medication and last injection was on 11/04/2017.  Since this time, BP and glucose numbers have been normalizing but can still be out of range at times.  Blood pressure today mildly elevated at 160/88 but per wife, BP this morning 139/77.  Patient questions whether he can begin driving at this time.  As no physical deficits were found, comprehension seem to be intact and no vision issues observed, it was recommended for wife to provide along with patient for the first few times, drive only places that he is familiar with and to avoid main roads or highways.  Patient is in agreement to this.  Denies new or worsening stroke/TIA symptoms.  06/18/2018 interval history: Patient is being seen today for 13-month follow-up visit  and is accompanied by his wife.  Overall, patient states he has been doing well with some speech difficulties but improvement since prior appointment. He continues to take aspirin without side effects of bleeding or  bruising.  Continues to take Zetia for HLD management.  Blood pressure today 194/84 initially but was rechecked for 136/76.  Blood pressure this morning at home was at 137/77. Per wife, BP fluctuates slightly at home and is managed by nephrologist. Loop recorder has not shown atrial fibrillation thus far.  No further concerns at this time and all questions answered during appointment.  Denies new or worsening stroke/TIA symptoms.  Update 02/27/2019 : He returns for follow-up after last visit 8 months ago.  He is accompanied by his wife.  Patient states he was doing fine until July 28 when the power ran out in the house and had to leave in 90 degrees heat for a couple of weeks.  He complained of dizziness and was probably dehydrated.  He started having intermittent tremors which initially started in his right arm but subsequently it was involving both arms as well as legs.  There was short lasting and would stop.  At this time when evaluated by primary physician he was found to have orthostasis as well as hypertension.  His blood pressure medications were tapered.  He has been feeling better for the last couple of weeks and blood pressure has come up.  He has not had tremors since then.  Patient has no prior history of tremors, family history of benign cyst and tremor.  He denies any other symptoms including gait or balance difficulties stooped posture or bradykinesia.  He had lab work done on 01/03/2019 which showed LDL cholesterol of 99 mg percent and hemoglobin A1c of 6.4.  Carotid ultrasound done on 02/28/2018 showed moderate plaque but no significant stenosis.  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  He does have a follow-up appointment next month and vein and vascular surgeon and will have a follow-up carotid ultrasound done at that visit.  He remains on aspirin which is tolerating well without bruising or bleeding.   His blood pressure slightly elevated today at 171/78 but he blames this on white coat hypertension.  14 system review of systems is positive for tremors, dizziness, dehydration, hypotension, orthostasis joint pain, back pain and aching muscles and all the systems negative  PMH:  Past Medical History:  Diagnosis Date   Anemia    DIABETES MELLITUS, TYPE II 01/02/2007   HYPERLIPIDEMIA 06/18/2007   HYPERTENSION 01/02/2007   RENAL INSUFFICIENCY 02/05/2008   Stroke ([place])    Stroke [facility]) 2019    Social History:  Social History   Socioeconomic History   Marital status: Married    Spouse name: Not on file   Number of children: Not on file   Years of education: Not on file   Highest education level: Not on file  Occupational History   Not on file  Social Needs   Financial resource strain: Not on file   Food insecurity    Worry: Not on file    Inability: Not on file   Transportation needs    Medical: Not on file    Non-medical: Not on file  Tobacco Use   Smoking status: Never Smoker   Smokeless tobacco: Never Used  Substance and Sexual Activity   Alcohol use: No   Drug use:  No   Sexual activity: Not on file  Lifestyle   Physical activity    Days per week: Not on file    Minutes per session: Not on file   Stress: Not on file  Relationships   Social connections    Talks on phone: Not on file    Gets together: Not on file    Attends religious service: Not on file    Active member of club or organization: Not on file    Attends meetings of clubs or organizations: Not on file    Relationship status: Not on file   Intimate partner violence    Fear of current or ex partner: Not on file    Emotionally abused: Not on file    Physically abused: Not on file    Forced sexual activity: Not on file  Other Topics Concern   Not on file  Social History Narrative   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 Likes to go to football games.     Medications:   Current Outpatient Medications on File Prior to Visit  Medication Sig Dispense Refill   aspirin 325 MG tablet Take 325 mg by mouth daily.     calcitRIOL (ROCALTROL) 0.25 MCG capsule Take 1 capsule by mouth daily.      ezetimibe (ZETIA) 10 MG tablet TAKE 1 TABLET(10 MG) BY MOUTH DAILY 90 tablet 3   ferrous sulfate 325 (65 FE) MG tablet Take 65 mg by mouth daily.     glucose blood test strip CHECK BLOOD SUGARS TWICE DAILY 200 each 3   insulin glargine (LANTUS) 100 UNIT/ML injection Inject 0.06 mLs (6 Units total) into the skin at bedtime. 10 mL 1   Insulin Pen Needle (B-D UF III MINI PEN NEEDLES) 31G X 5 MM MISC USE TO TEST BLOOD GLUCOSE FOUR TIMES DAILY 400 each 0   labetalol (NORMODYNE) 200 MG tablet TAKE 1 TABLET(200 MG) BY MOUTH THREE TIMES DAILY 270 tablet 3   nateglinide (STARLIX) 60 MG tablet Take 60 mg by mouth daily.     NON FORMULARY      valsartan (DIOVAN) 160 MG tablet TK 1 T PO QD     VITAMIN D PO Take 2,000 Units by mouth.     No current facility-administered medications on file prior to visit.     Allergies:   Allergies  Allergen Reactions   Repatha [Evolocumab] Hypertension   Bactrim [Sulfamethoxazole-Trimethoprim] Hives, Itching and Swelling   Penicillins     Has patient had a PCN reaction causing immediate rash, facial/tongue/throat swelling, SOB or lightheadedness with hypotension: Unk Has patient had a PCN reaction causing severe rash involving mucus membranes or skin necrosis: Unk Has patient had a PCN reaction that required hospitalization: Unk Has patient had a PCN reaction occurring within the last 10 years: No If all of the above answers are "NO", then may proceed with Cephalosporin use.   Statins     myalgia   Sulfa Drugs Cross Reactors Hives, Itching and Swelling    Physical Exam General: well developed, well nourished pleasant  elderly African-American male, seated, in no evident distress Head: head normocephalic and atraumatic.  Neck: supple with no carotid or supraclavicular bruits Cardiovascular: regular rate and rhythm, no murmurs Musculoskeletal: no deformity Skin:  no rash/petichiae Vascular:  Normal pulses all extremities Vitals:  02/27/19 0933  BP: (!) 171/78  Pulse: 66  Temp: (!) 97.5 F (36.4 C)   Neurologic Exam Mental Status: Awake and fully alert.  Mild dysarthria present.  Oriented to place and time. Recent and remote memory intact. Attention span, concentration and fund of knowledge appropriate. Mood and affect appropriate. Cranial Nerves: Fundoscopic exam reveals sharp disc margins. Pupils equal, briskly reactive to light. Extraocular movements full without nystagmus. Visual fields full to confrontation. Hearing intact. Facial sensation intact. Face, tongue, palate moves normally and symmetrically.  Motor: Normal bulk and tone. Normal strength in all tested extremity muscles.  Mild fine action tremor of the left hand absent at rest.  No bradykinesia or cogwheel rigidity or diminished arm swing while walking Sensory.: intact to touch ,pinprick .position and vibratory sensation.  Coordination: Rapid alternating movements normal in all extremities. Finger-to-nose and heel-to-shin performed accurately bilaterally. Gait and Station: Arises from chair without difficulty. Stance is normal. Gait demonstrates normal stride length and balance . Able to heel, toe and tandem walk without difficulty.  Reflexes: 1+ and symmetric. Toes downgoing.     ASSESSMENT: 57 year African-American male with embolic left MCA branch infarct in January 2019 of cryptogenic etiology. Vascular risk factors of hypertension and hyperlipidemia.  Patient has had recent intermittent action tremors since July 2020 related to hypotension and dehydration which appear to be improved   PLAN: I had a long d/w patient and his wife about  his remote stroke, risk for recurrent stroke/TIAs, personally independently reviewed imaging studies and stroke evaluation results and answered questions.Continue aspirin 325 mg daily  for secondary stroke prevention and maintain strict control of hypertension with blood pressure goal below 130/90, diabetes with hemoglobin A1c goal below 6.5% and lipids with LDL cholesterol goal below 70 mg/dL. I also advised the patient to eat a healthy diet with plenty of whole grains, cereals, fruits and vegetables, exercise regularly and maintain ideal body weight.  I recommend patient continue follow-up with his primary physician for blood pressure management and avoid hypotension which seem to have precipitated action tremors which are now significantly improved.  Followup in the future with me in 1 year or call earlier if necessary. Antony Contras, MD [facility] [address] [place], [place] [address]  Phone [phone] Fax [phone]

## 2019-02-27 NOTE — Telephone Encounter (Signed)
Patients states he will call to schedule 1 yr f/u w/ Dr. Leonie Man

## 2019-02-28 NOTE — Telephone Encounter (Signed)
Rx sent to the pharmacy by e-scribe. 

## 2019-03-01 NOTE — Telephone Encounter (Signed)
[name] [name] to [name] [name].  No tremor since last Monday.  Continues to have some dizziness but is becoming more active but not for long periods of time.  BP during the day is running in the 120s and teens during the earlier day.  Goes up in the evening.  Currently taking labetalol (NORMODYNE) 200 MG tablet and valsartan (DIOVAN) 160 MG tablet.  Has not been taking hydralazine.  [name] [name] did not want his BP to run too low.  Blood glucose has been running a little higher.  [name] [name] states it has been in the upper 130s.- 150s.

## 2019-03-01 NOTE — Progress Notes (Signed)
[facility] Summary Report / Loop Recorder 

## 2019-03-05 NOTE — Telephone Encounter (Signed)
I am glad he is feeling better.   How high is his BP going in the evening?  He can increase his insulin dose by 1-2 units

## 2019-03-05 NOTE — Telephone Encounter (Signed)
Spoke to [name].  Pt continues to have zero energy and repeatedly says "I don't feel right."  BP now going back down in the evening.  Last night it was 123/65 and Sunday night was 141/71.  Advised [name] to increase insulin by 1-2 units.  She will start with 1 unit first.  [name] did mention that if the bp goes down than the bg will go up.  Continues to have some unsteadiness but not as bad as it was.  Has been walking around holding left his side but saying his back hurts.  Recently had Ct and MRI.  [name] reports something was noted of the left kidney and liver on the CT but not the MRI.

## 2019-03-06 ENCOUNTER — Other Ambulatory Visit: Payer: Self-pay | Admitting: Adult Health

## 2019-03-06 DIAGNOSIS — D509 Iron deficiency anemia, unspecified: Secondary | ICD-10-CM

## 2019-03-06 NOTE — Telephone Encounter (Signed)
I think his low energy is due to iron deficiency anemia. Back in July I asked for him to be referred to hematology for further evaluation and possible iron infusions. It does not look like this was done. I will go head and make the referral.  It looks like his MRI showed a simple cysts on his liver. Dr. Ardis Hughs will probably schedule another MRI or CT in 6 months

## 2019-03-06 NOTE — Telephone Encounter (Signed)
Spoke to [name] and informed her that [name] has placed an order for hematology.  Advised the office may be behind in referrals due to Covid.  She will call me back if she does not hear anything about an appointment by the end of next week.  She also remembers Dr. Ardis Hughs mentioning about a repeat MRI in the future.  Nothing further needed at this time.

## 2019-03-10 ENCOUNTER — Other Ambulatory Visit: Payer: Self-pay | Admitting: Adult Health

## 2019-03-12 NOTE — Telephone Encounter (Signed)
DENIED.  PT IS NO LONGER TAKING THIS MEDICATION.  MESSAGE SENT TO THE PHARMACY. 

## 2019-03-19 ENCOUNTER — Telehealth (HOSPITAL_COMMUNITY): Payer: Self-pay | Admitting: Rehabilitation

## 2019-03-19 ENCOUNTER — Telehealth: Payer: Self-pay | Admitting: Hematology

## 2019-03-19 ENCOUNTER — Other Ambulatory Visit: Payer: Self-pay

## 2019-03-19 DIAGNOSIS — I6522 Occlusion and stenosis of left carotid artery: Secondary | ICD-10-CM

## 2019-03-19 NOTE — Telephone Encounter (Signed)

## 2019-03-19 NOTE — Telephone Encounter (Signed)
Received a new hem referral from [employer] for [place]. I cld and spoke to the pt's wife, and scheduled him to see Dr. Irene Limbo on 9/30 at [employer] [facility]. Mrs. Kamphaus has been made aware for Mr. Ku to arrive 15 minutes early and of our no visitor policy.

## 2019-03-20 ENCOUNTER — Encounter: Payer: Self-pay | Admitting: Vascular Surgery

## 2019-03-20 ENCOUNTER — Telehealth: Payer: Self-pay | Admitting: Adult Health

## 2019-03-20 ENCOUNTER — Ambulatory Visit (INDEPENDENT_AMBULATORY_CARE_PROVIDER_SITE_OTHER): Payer: Medicare Other | Admitting: Vascular Surgery

## 2019-03-20 ENCOUNTER — Other Ambulatory Visit: Payer: Self-pay

## 2019-03-20 ENCOUNTER — Ambulatory Visit (HOSPITAL_COMMUNITY)
Admission: RE | Admit: 2019-03-20 | Discharge: 2019-03-20 | Disposition: A | Discharge status: Home / Self Care | Payer: Medicare Other | Source: Ambulatory Visit | Attending: Family | Admitting: Family

## 2019-03-20 VITALS — BP 157/74 | HR 65 | Temp 97.5°F | Resp 20 | Ht 72.5 in | Wt 219.2 lb

## 2019-03-20 DIAGNOSIS — I6522 Occlusion and stenosis of left carotid artery: Secondary | ICD-10-CM

## 2019-03-20 NOTE — Telephone Encounter (Signed)
I called the  pt was told that he has an appt for 03/21/2019 . I did not see appt information in Epic . Will wait until Seth Bake update chart

## 2019-03-20 NOTE — Progress Notes (Signed)
Patient name: [name] [name] MRN: [id number] DOB: 01-28-1946 Sex: male  REASON FOR VISIT:   Follow-up of carotid disease.  HPI:   [name] [name] is a pleasant 73 y.o. male who I last saw him on 02/28/2018.  I had seen him in consultation in February 2019 with a left carotid stenosis.  Patient had presented with expressive aphasia in January 2019 and CT angiogram of the neck suggested a plaque in the left internal carotid artery.  However duplex at that time was fairly unremarkable.  CT of the head on admission did not show any evidence of a stroke.  I elected to follow this and at the time of his last follow-up visit in August 2019, there was a less than 39% stenosis bilaterally.  Since I saw him last his main problem has been issues with dizziness and unsteady gait.  He denies any focal weakness or paresthesias, expressive or receptive a aphasia.  His blood pressure had been running low and it was felt that likely some of the dizziness was attributed to this.  His medications are being adjusted.  In addition he was found to be anemic.  He underwent upper and lower endoscopy which were reportedly unremarkable.  He is also had some tremors.  In addition he had lost his air conditioning for 2 weeks during a very hot time during the summer and it was felt that dehydration was certainly contributing to his dizziness.  He is on aspirin.  He says that he does not tolerate statins.  Past Medical History:  Diagnosis Date  . Anemia   . DIABETES MELLITUS, TYPE II 01/02/2007  . HYPERLIPIDEMIA 06/18/2007  . HYPERTENSION 01/02/2007  . RENAL INSUFFICIENCY 02/05/2008  . Stroke ([place])   . Stroke [facility]) 2019    Family History  Problem Relation Age of Onset  . Hypertension Mother   . Diabetes Mother   . Stroke Father   . Stroke Maternal Grandmother   . Colon cancer Neg Hx   . Esophageal cancer Neg Hx   . Rectal cancer Neg Hx   . Stomach cancer Neg Hx     SOCIAL HISTORY: Social History   Tobacco  Use  . Smoking status: Never Smoker  . Smokeless tobacco: Never Used  Substance Use Topics  . Alcohol use: No    Allergies  Allergen Reactions  . Repatha [Evolocumab] Hypertension  . Bactrim [Sulfamethoxazole-Trimethoprim] Hives, Itching and Swelling  . Penicillins     Has patient had a PCN reaction causing immediate rash, facial/tongue/throat swelling, SOB or lightheadedness with hypotension: Unk Has patient had a PCN reaction causing severe rash involving mucus membranes or skin necrosis: Unk Has patient had a PCN reaction that required hospitalization: Unk Has patient had a PCN reaction occurring within the last 10 years: No If all of the above answers are "NO", then may proceed with Cephalosporin use.  . Statins     myalgia  . Sulfa Drugs Cross Reactors Hives, Itching and Swelling    Current Outpatient Medications  Medication Sig Dispense Refill  . aspirin 325 MG tablet Take 325 mg by mouth daily.    . calcitRIOL (ROCALTROL) 0.25 MCG capsule Take 1 capsule by mouth daily.     Marland Kitchen ezetimibe (ZETIA) 10 MG tablet TAKE 1 TABLET(10 MG) BY MOUTH DAILY 90 tablet 3  . ferrous sulfate 325 (65 FE) MG tablet Take 65 mg by mouth daily.    Marland Kitchen glucose blood test strip CHECK BLOOD SUGARS TWICE DAILY 200 each  3  . Insulin Glargine (LANTUS SOLOSTAR) 100 UNIT/ML Solostar Pen Inject 6 Units into the skin at bedtime. 15 mL 0  . insulin glargine (LANTUS) 100 UNIT/ML injection Inject 0.06 mLs (6 Units total) into the skin at bedtime. 10 mL 1  . Insulin Pen Needle (B-D UF III MINI PEN NEEDLES) 31G X 5 MM MISC USE TO TEST BLOOD GLUCOSE FOUR TIMES DAILY 400 each 0  . labetalol (NORMODYNE) 200 MG tablet TAKE 1 TABLET(200 MG) BY MOUTH THREE TIMES DAILY 270 tablet 3  . nateglinide (STARLIX) 60 MG tablet Take 60 mg by mouth daily.    . NON FORMULARY     . valsartan (DIOVAN) 160 MG tablet TK 1 T PO QD    . VITAMIN D PO Take 2,000 Units by mouth.     No current facility-administered medications for this  visit.     REVIEW OF SYSTEMS:  [X]  denotes positive finding, [ ]  denotes negative finding Cardiac  Comments:  Chest pain or chest pressure:    Shortness of breath upon exertion:    Short of breath when lying flat:    Irregular heart rhythm:        Vascular    Pain in calf, thigh, or hip brought on by ambulation:    Pain in feet at night that wakes you up from your sleep:     Blood clot in your veins:    Leg swelling:         Pulmonary    Oxygen at home:    Productive cough:     Wheezing:         Neurologic    Sudden weakness in arms or legs:     Sudden numbness in arms or legs:     Sudden onset of difficulty speaking or slurred speech:    Temporary loss of vision in one eye:     Problems with dizziness:  x       Gastrointestinal    Blood in stool:     Vomited blood:         Genitourinary    Burning when urinating:     Blood in urine:        Psychiatric    Major depression:         Hematologic    Bleeding problems:    Problems with blood clotting too easily:        Skin    Rashes or ulcers:        Constitutional    Fever or chills:     PHYSICAL EXAM:   Vitals:   03/20/19 1341 03/20/19 1344  BP: (!) 154/74 (!) 157/74  Pulse: 65   Resp: 20   Temp: (!) 97.5 F (36.4 C)   SpO2: 100%   Weight: 219 lb 3.2 oz (99.4 kg)   Height: 6' 0.5" (1.842 m)    GENERAL: The patient is a well-nourished male, in no acute distress. The vital signs are documented above. CARDIAC: There is a regular rate and rhythm.  VASCULAR: I do not detect carotid bruits. He has palpable femoral pulses and weakly palpable pedal pulses. He has no significant lower extremity swelling. PULMONARY: There is good air exchange bilaterally without wheezing or rales. ABDOMEN: Soft and non-tender with normal pitched bowel sounds.  MUSCULOSKELETAL: There are no major deformities or cyanosis. NEUROLOGIC: No focal weakness or paresthesias are detected. SKIN: There are no ulcers or rashes noted.  PSYCHIATRIC: The patient has a normal affect.  DATA:  CAROTID DUPLEX: I have independently interpreted his carotid duplex scan today.    On the right side there is a less than 39% stenosis.  The right vertebral artery is patent with antegrade flow.  On the left side there is a 40 to 59% stenosis.  The left vertebral artery is patent with antegrade flow.  MEDICAL ISSUES:   ASYMPTOMATIC MODERATE LEFT CAROTID STENOSIS: This patient has a 40 to 59% left carotid stenosis but this is asymptomatic.  Certainly I cannot attribute his mild left carotid stenosis to his dizziness.  In addition he has no evidence of vertebrobasilar insufficiency based on his duplex.  Both vertebral arteries are patent with antegrade flow.  I explained we would not consider left carotid endarterectomy less the stenosis progressed to greater than 80% or he develop new neurologic symptoms.  I have ordered a follow-up carotid duplex scan in 1 year and I will see him back at that time.  He knows to call sooner if he has problems.  He knows to continue taking his aspirin.  Fortunately he is not a smoker.  Deitra Mayo [facility] [phone]

## 2019-03-22 LAB — HM DIABETES EYE EXAM

## 2019-03-27 ENCOUNTER — Encounter: Payer: Self-pay | Admitting: Gastroenterology

## 2019-03-27 ENCOUNTER — Ambulatory Visit (INDEPENDENT_AMBULATORY_CARE_PROVIDER_SITE_OTHER): Payer: Medicare Other | Admitting: Gastroenterology

## 2019-03-27 ENCOUNTER — Ambulatory Visit (INDEPENDENT_AMBULATORY_CARE_PROVIDER_SITE_OTHER): Payer: Medicare Other | Admitting: *Deleted

## 2019-03-27 ENCOUNTER — Other Ambulatory Visit: Payer: Self-pay

## 2019-03-27 VITALS — BP 138/62 | HR 65 | Temp 98.5°F | Ht 75.0 in | Wt 219.0 lb

## 2019-03-27 DIAGNOSIS — I639 Cerebral infarction, unspecified: Secondary | ICD-10-CM | POA: Diagnosis not present

## 2019-03-27 DIAGNOSIS — D509 Iron deficiency anemia, unspecified: Secondary | ICD-10-CM

## 2019-03-27 NOTE — Progress Notes (Signed)
Review of pertinent gastrointestinal problems: 1.  Iron deficiency anemia work-up.  May 2020 hemoglobin 9, MCV 78, ferritin 5.9.  EGD June 2020 showed a normal examination.  Biopsies were taken from his duodenum to check for celiac sprue.  Pathology was negative for sprue. colonoscopy June 2022 subcentimeter polyps were removed.  The examination was otherwise normal.   Pathology showed adenomatous polyps.  I recommended recall colonoscopy at 7-year interval.  Continued work-up for iron deficiency anemia included in July 2020 PA and lateral chest x-ray that was normal, July 2020 CT scan abdomen without IV contrast that suggested a new 1.6 cm lesion in his liver.  Nonobstructing right renal stone.  Follow-up MRI of the liver favored benign cystic lesions in the liver.  The radiologist wrote to "consider follow-up imaging in 6 months with repeat CT scan or MRI to confirm the benignity of this lesion."  Follow-up labs through his primary care physician July 2020 showed not much improvement in his iron levels with a hemoglobin of 9.4, MCV of 77, creatinine 2.7 which is fairly chronic for him.  He was referred to hematology   HPI: This is a very pleasant 73 year old man whom I last saw the time of his colonoscopy and upper endoscopy.  See that above.  He overall feels sluggish.  He has no overt blood in his stool he is not throwing up blood or having blood in his urine.  No abdominal pains.  His weight is overall stable.  Chief complaint is iron deficiency anemia  ROS: complete GI ROS as described in HPI, all other review negative.  Constitutional:  No unintentional weight loss   Past Medical History:  Diagnosis Date  . Anemia   . DIABETES MELLITUS, TYPE II 01/02/2007  . HYPERLIPIDEMIA 06/18/2007  . HYPERTENSION 01/02/2007  . RENAL INSUFFICIENCY 02/05/2008  . Stroke ([place])   . Stroke [facility]) 2019    Past Surgical History:  Procedure Laterality Date  . LOOP RECORDER INSERTION N/A 07/11/2017   Procedure:  LOOP RECORDER INSERTION;  Surgeon: Thompson Grayer, MD;  Location: [place] [facility];  Service: Cardiovascular;  Laterality: N/A;  . SHOULDER SURGERY     left  . TEE WITHOUT CARDIOVERSION N/A 07/11/2017   Procedure: TRANSESOPHAGEAL ECHOCARDIOGRAM (TEE);  Surgeon: Dorothy Spark, MD;  Location: [facility] ENDOSCOPY;  Service: Cardiovascular;  Laterality: N/A;  . TRANSURETHRAL RESECTION OF PROSTATE     history of retention/hematuria     Current Outpatient Medications  Medication Sig Dispense Refill  . aspirin 325 MG tablet Take 325 mg by mouth daily.    . calcitRIOL (ROCALTROL) 0.25 MCG capsule Take 1 capsule by mouth daily.     Marland Kitchen ezetimibe (ZETIA) 10 MG tablet TAKE 1 TABLET(10 MG) BY MOUTH DAILY 90 tablet 3  . ferrous sulfate 325 (65 FE) MG tablet Take 65 mg by mouth daily.    Marland Kitchen glucose blood test strip CHECK BLOOD SUGARS TWICE DAILY 200 each 3  . Insulin Glargine (LANTUS SOLOSTAR) 100 UNIT/ML Solostar Pen Inject 6 Units into the skin at bedtime. 15 mL 0  . insulin glargine (LANTUS) 100 UNIT/ML injection Inject 0.06 mLs (6 Units total) into the skin at bedtime. 10 mL 1  . Insulin Pen Needle (B-D UF III MINI PEN NEEDLES) 31G X 5 MM MISC USE TO TEST BLOOD GLUCOSE FOUR TIMES DAILY 400 each 0  . nateglinide (STARLIX) 60 MG tablet Take 60 mg by mouth daily.    . NON FORMULARY     . valsartan (  DIOVAN) 160 MG tablet TK 1 T PO QD    . VITAMIN D PO Take 2,000 Units by mouth.     No current facility-administered medications for this visit.     Allergies as of 03/27/2019 - Review Complete 03/27/2019  Allergen Reaction Noted  . Repatha [evolocumab] Hypertension 12/07/2017  . Bactrim [sulfamethoxazole-trimethoprim] Hives, Itching, and Swelling 12/21/2010  . Penicillins  08/02/2006  . Statins  12/20/2011  . Sulfa drugs cross reactors Hives, Itching, and Swelling 12/21/2010    Family History  Problem Relation Age of Onset  . Hypertension Mother   . Diabetes Mother   . Stroke Father   . Stroke  Maternal Grandmother   . Colon cancer Neg Hx   . Esophageal cancer Neg Hx   . Rectal cancer Neg Hx   . Stomach cancer Neg Hx     Social History   Socioeconomic History  . Marital status: Married    Spouse name: Not on file  . Number of children: Not on file  . Years of education: Not on file  . Highest education level: Not on file  Occupational History  . Not on file  Social Needs  . Financial resource strain: Not on file  . Food insecurity    Worry: Not on file    Inability: Not on file  . Transportation needs    Medical: Not on file    Non-medical: Not on file  Tobacco Use  . Smoking status: Never Smoker  . Smokeless tobacco: Never Used  Substance and Sexual Activity  . Alcohol use: No  . Drug use: No  . Sexual activity: Not on file  Lifestyle  . Physical activity    Days per week: Not on file    Minutes per session: Not on file  . Stress: Not on file  Relationships  . Social Herbalist on phone: Not on file    Gets together: Not on file    Attends religious service: Not on file    Active member of club or organization: Not on file    Attends meetings of clubs or organizations: Not on file    Relationship status: Not on file  . Intimate partner violence    Fear of current or ex partner: Not on file    Emotionally abused: Not on file    Physically abused: Not on file    Forced sexual activity: Not on file  Other Topics Concern  . 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 Likes to go to football games.      Physical Exam: BP 138/62   Pulse 65   Temp 98.5 F (36.9 C)   Ht 6\' 3"  (1.905 m)   Wt 219 lb (99.3 kg)   BMI 27.37 kg/m  Constitutional: generally well-appearing Psychiatric: alert and oriented x3 Abdomen: soft, nontender, nondistended, no obvious ascites, no peritoneal signs, normal  bowel sounds No peripheral edema noted in lower extremities  Assessment and plan: 73 y.o. male with iron deficiency anemia unclear etiology  GI work-up has been extensive and so far negative to date.  He will complete 2 sets of home fecal occult blood tests.  If any of these are positive then I may recommend an examination of his small bowel with a  PillCam.  If they are negative then I would say that the GI work-up for his issue is completed and no further testing will be necessary.  He is seeing a hematologist next week.  As far as the possible cyst in his liver.  Since he could not get IV contrast his MRI was of suboptimal quality the radiologist recommended a repeat MRI at 13-month interval.  We will arrange for that to be done which would be February 2021.  Please see the "Patient Instructions" section for addition details about the plan.  Owens Loffler, MD [facility] 03/27/2019, 9:18 AM

## 2019-03-27 NOTE — Patient Instructions (Signed)
Your provider has requested that you go to the basement level once you complete your stool study cards  You may bring them back or mail them back to our office once you finish  You will need an MRI of the Liver in 2021 we will contact you for an appointment  Thank you for entrusting me with your care and choosing [facility].  Dr Ardis Hughs

## 2019-03-28 LAB — CUP PACEART REMOTE DEVICE CHECK
Date Time Interrogation Session: 20200924003942
Implantable Pulse Generator Implant Date: 20190108

## 2019-03-29 NOTE — Progress Notes (Signed)
HEMATOLOGY/ONCOLOGY CONSULTATION NOTE  Date of Service: 03/29/2019  Patient Care Team: Dorothyann Peng, NP as PCP - General (Family Medicine) Adegoroye, Wynona Luna, MD (Nephrology)  CHIEF COMPLAINTS/PURPOSE OF CONSULTATION:  IDA  HISTORY OF PRESENTING ILLNESS:   [name] [name] is a wonderful 73 y.o. male who has been referred to [place] by Dorothyann Peng NP for evaluation and management of Iron Deficiency Anemia. The pt reports that he is doing well overall.   The pt reports that he was first told that he had IDA about 3 months ago when he got labs with his PCP. Pt then saw a GI and received a Upper Endoscopy and a Colonoscopy with biopsy. He denies any bleeding concerns or bloody stools. Pt has taken home a Hemoccult test but it has not resulted. Pt has taken Asprin since his stroke in 2019. His stroke did cause residual slurred speech but he denies any other residual changes such as vision changes or neuromotor deficits. He notes that his Optometrist saw particles in his eye after his stroke. Pt also notes intermittent chronic back pain. 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 Pt has not been on any long-term acid suppressants. He has no dietary restrictions and can eat bread and other gluten products with no digestive issues. Pt's understanding is that his CKD was caused by his HTN and Diabetes. Pt reports that his Diabetes and HTN have been moderately well controlled. Pt has been taking a Vitamin D replacement for some time and has been taking PO Iron for at least 6 months. Asprin is the only blood thinner that he is currently taking. Pt has seen a Urologist recently but has not seen Nephrologist in the last few years. He states that he has been eating well and has had no recent unexpected weight loss.   Of note prior to the patient's visit today, pt has had Colonoscopy completed on 12/04/2018 with results revealing "-Two  2 to 3 mm polyps in the descending colon and in the transverse colon, removed with a cold snare. Resected and retrieved. - The examination was otherwise normal on direct and retroflexion views."   Pt has had Upper Endoscopy completed on 12/04/2018 with results revealing "-Normal esophagus. - Normal stomach. - Normal examined duodenum. - Biopsies were taken with a cold forceps for evaluation of celiac disease."  Pt has had Surgical Pathology [phone]) completed on 12/04/2018 with results revealing "1. Surgical [P], colon, descending and transverse, polyp (2) - SESSILE SERRATED POLYP WITHOUT CYTOLOGIC DYSPLASIA - INFLAMMATORY POLYP 2. Surgical [P], duodenum - DUODENAL MUCOSA WITH NO SPECIFIC HISTOPATHOLOGIC CHANGES - NEGATIVE FOR INCREASED INTRAEPITHELIAL LYMPHOCYTES OR VILLOUS ARCHITECTURAL CHANGES."  Most recent lab results (01/03/2019) of CBC w/diff & CMP is as follows: WBC at 6.8K, RBC at 9.4, HCT at 29.2, MCV at 77.1, MCHC at 32.2, RDW at 15.8, PLTs at 303.0K, Neutro Rel at 59.3, Lymphs Rel at 25.9, Monocytes Rel at 6.6, Eosinophils Rel at 7.4, Basophils Res at 0.8, Neutro Abs at 4.1K, Lymphs Abs at 1.8K, Monocytes Abs at 0.5K, Eosinophils Abs at 0.5K, Basophils Abs at 0.1K, Sodium at 140, Potassium at 4.5, Chloride at 113, OC2 at 23, Glucose at 119, BUN at 46, Creatinine at 2.70, Total Bilirubin at 0.3, Alkaline Phosphatase at 54, AST at 10, ALT at 10, Total Protein at 7.0, Albumin at 4.3, Calcium at 9.0, GFR at 28.24. 04/03/2019 Hep C Antibody is "NON-REACTIVE" 04/03/2019 Iron and  TIBC is as follows: Iron at 29, TIBC at 362, % Sat at 8  On review of systems, pt reports back pain and denies abdominal pain, leg swelling, unexpected weight loss and any other symptoms.   On PMHx the pt reports CKD, Diabetes, HTN, Stroke (2019). 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    MEDICAL HISTORY:  Past Medical History:  Diagnosis Date  . Anemia   . DIABETES  MELLITUS, TYPE II 01/02/2007  . HYPERLIPIDEMIA 06/18/2007  . HYPERTENSION 01/02/2007  . RENAL INSUFFICIENCY 02/05/2008  . Stroke ([place])   . Stroke [facility]) 2019    SURGICAL HISTORY: Past Surgical History:  Procedure Laterality Date  . LOOP RECORDER INSERTION N/A 07/11/2017   Procedure: LOOP RECORDER INSERTION;  Surgeon: Thompson Grayer, MD;  Location: [facility];  Service: Cardiovascular;  Laterality: N/A;  . SHOULDER SURGERY     left  . TEE WITHOUT CARDIOVERSION N/A 07/11/2017   Procedure: TRANSESOPHAGEAL ECHOCARDIOGRAM (TEE);  Surgeon: Dorothy Spark, MD;  Location: [facility] ENDOSCOPY;  Service: Cardiovascular;  Laterality: N/A;  . TRANSURETHRAL RESECTION OF PROSTATE     history of retention/hematuria     SOCIAL HISTORY: Social History   Socioeconomic History  . Marital status: Married    Spouse name: Not on file  . Number of children: Not on file  . Years of education: Not on file  . Highest education level: Not on file  Occupational History  . Not on file  Social Needs  . Financial resource strain: Not on file  . Food insecurity    Worry: Not on file    Inability: Not on file  . Transportation needs    Medical: Not on file    Non-medical: Not on file  Tobacco Use  . Smoking status: Never Smoker  . Smokeless tobacco: Never Used  Substance and Sexual Activity  . Alcohol use: No  . Drug use: No  . Sexual activity: Not on file  Lifestyle  . Physical activity    Days per week: Not on file    Minutes per session: Not on file  . Stress: Not on file  Relationships  . Social Herbalist on phone: Not on file    Gets together: Not on file    Attends religious service: Not on file    Active member of club or organization: Not on file    Attends meetings of clubs or organizations: Not on file    Relationship status: Not on file  . Intimate partner violence    Fear of current or ex partner: Not on file    Emotionally abused: Not on file    Physically abused: Not  on file    Forced sexual activity: Not on file  Other Topics Concern  . 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 Likes to go to football games.     FAMILY HISTORY: Family History  Problem Relation Age of Onset  . Hypertension Mother   . Diabetes Mother   . Stroke Father   . Stroke Maternal Grandmother   . Colon cancer Neg Hx   . Esophageal cancer Neg Hx   . Rectal cancer Neg Hx   . Stomach cancer Neg Hx  ALLERGIES:  is allergic to repatha [evolocumab]; bactrim [sulfamethoxazole-trimethoprim]; penicillins; statins; and sulfa drugs cross reactors.  MEDICATIONS:  Current Outpatient Medications  Medication Sig Dispense Refill  . aspirin 325 MG tablet Take 325 mg by mouth daily.    . calcitRIOL (ROCALTROL) 0.25 MCG capsule Take 1 capsule by mouth daily.     Marland Kitchen ezetimibe (ZETIA) 10 MG tablet TAKE 1 TABLET(10 MG) BY MOUTH DAILY 90 tablet 3  . ferrous sulfate 325 (65 FE) MG tablet Take 65 mg by mouth daily.    Marland Kitchen glucose blood test strip CHECK BLOOD SUGARS TWICE DAILY 200 each 3  . Insulin Glargine (LANTUS SOLOSTAR) 100 UNIT/ML Solostar Pen Inject 6 Units into the skin at bedtime. 15 mL 0  . insulin glargine (LANTUS) 100 UNIT/ML injection Inject 0.06 mLs (6 Units total) into the skin at bedtime. 10 mL 1  . Insulin Pen Needle (B-D UF III MINI PEN NEEDLES) 31G X 5 MM MISC USE TO TEST BLOOD GLUCOSE FOUR TIMES DAILY 400 each 0  . nateglinide (STARLIX) 60 MG tablet Take 60 mg by mouth daily.    . NON FORMULARY     . valsartan (DIOVAN) 160 MG tablet TK 1 T PO QD    . VITAMIN D PO Take 2,000 Units by mouth.     No current facility-administered medications for this visit.     REVIEW OF SYSTEMS:    10 Point review of Systems was done is negative except as noted above.  PHYSICAL EXAMINATION: ECOG PERFORMANCE STATUS: 1 - Symptomatic but  completely ambulatory  . Vitals:   04/03/19 1004  BP: (!) 169/79  Pulse: 74  Resp: 16  Temp: 99.1 F (37.3 C)  SpO2: 100%   Filed Weights   04/03/19 1004  Weight: 218 lb 14.4 oz (99.3 kg)   .Body mass index is 27.36 kg/m.  GENERAL:alert, in no acute distress and comfortable SKIN: no acute rashes, no significant lesions EYES: conjunctiva are pink and non-injected, sclera anicteric OROPHARYNX: MMM, no exudates, no oropharyngeal erythema or ulceration NECK: supple, no JVD LYMPH:  no palpable lymphadenopathy in the cervical, axillary or inguinal regions LUNGS: clear to auscultation b/l with normal respiratory effort HEART: regular rate & rhythm ABDOMEN:  normoactive bowel sounds , non tender, not distended. Extremity: no pedal edema PSYCH: alert & oriented x 3 with fluent speech NEURO: no focal motor/sensory deficits  LABORATORY DATA:  I have reviewed the data as listed  . CBC Latest Ref Rng & Units 04/03/2019 01/03/2019 11/02/2018  WBC 4.0 - 10.5 K/uL 5.7 6.8 7.3  Hemoglobin 13.0 - 17.0 g/dL 7.9(L) 9.4(L) 9.0(L)  Hematocrit 39.0 - 52.0 % 25.7(L) 29.2(L) 26.8 Repeated and verified X2.(L)  Platelets 150 - 400 K/uL 377 303.0 317.0   . CBC    Component Value Date/Time   WBC 5.7 04/03/2019 1106   RBC 3.68 (L) 04/03/2019 1106   HGB 7.9 (L) 04/03/2019 1106   HCT 25.7 (L) 04/03/2019 1106   PLT 377 04/03/2019 1106   MCV 69.8 (L) 04/03/2019 1106   MCH 21.5 (L) 04/03/2019 1106   MCHC 30.7 04/03/2019 1106   RDW 15.0 04/03/2019 1106   LYMPHSABS 1.3 04/03/2019 1106   MONOABS 0.4 04/03/2019 1106   EOSABS 0.3 04/03/2019 1106   BASOSABS 0.1 04/03/2019 1106    . CMP Latest Ref Rng & Units 04/03/2019 01/03/2019 09/18/2018  Glucose 70 - 99 mg/dL 157(H) 119(H) -  BUN 8 - 23 mg/dL 41(H) 46(H) 46(A)  Creatinine 0.61 - 1.24 mg/dL  2.77(H) 2.70(H) 2.6(A)  Sodium 135 - 145 mmol/L 141 140 141  Potassium 3.5 - 5.1 mmol/L 4.2 4.5 4.3  Chloride 98 - 111 mmol/L 109 113(H) -  CO2 22 - 32  mmol/L 23 23 -  Calcium 8.9 - 10.3 mg/dL 9.1 9.0 -  Total Protein 6.5 - 8.1 g/dL 7.1 7.0 -  Total Bilirubin 0.3 - 1.2 mg/dL <0.2(L) 0.3 -  Alkaline Phos 38 - 126 U/L 61 54 55  AST 15 - 41 U/L 9(L) 10 13(A)  ALT 0 - 44 U/L 11 10 11    . Lab Results  Component Value Date   IRON 49 04/03/2019   TIBC 388 04/03/2019   IRONPCTSAT 13 (L) 04/03/2019   (Iron and TIBC)  Lab Results  Component Value Date   FERRITIN 12 (L) 04/03/2019    12/04/2018 ([id number]) Surgical Pathology   12/04/2018 Upper Endoscopy    12/04/2018 Colonoscopy    RADIOGRAPHIC STUDIES: I have personally reviewed the radiological images as listed and agreed with the findings in the report. Vas US Carotid  Result Date: 03/20/2019 Carotid Arterial Duplex Study Indications:       History of CVA and Carotid artery disease. Comparison Study:  Increased left ICA velocity since prior exam of 02/28/2018 Performing Technologist: Alvia Grove RVT  Examination Guidelines: A complete evaluation includes B-mode imaging, spectral Doppler, color Doppler, and power Doppler as needed of all accessible portions of each vessel. Bilateral testing is considered an integral part of a complete examination. Limited examinations for reoccurring indications may be performed as noted.  Right Carotid Findings: +----------+--------+--------+--------+------------------+--------+           PSV cm/sEDV cm/sStenosisPlaque DescriptionComments +----------+--------+--------+--------+------------------+--------+ CCA Prox  1       0                                          +----------+--------+--------+--------+------------------+--------+ CCA Mid   1       0               heterogenous               +----------+--------+--------+--------+------------------+--------+ CCA Distal1       0               heterogenous               +----------+--------+--------+--------+------------------+--------+ ICA Prox  1       0       1-39%    heterogenous               +----------+--------+--------+--------+------------------+--------+ ICA Mid   1       0                                          +----------+--------+--------+--------+------------------+--------+ ICA Distal1       0                                          +----------+--------+--------+--------+------------------+--------+ ECA       1       0                                          +----------+--------+--------+--------+------------------+--------+ +----------+--------+-------+----------------+-------------------+  PSV cm/sEDV cmsDescribe        Arm Pressure (mmHG) +----------+--------+-------+----------------+-------------------+ Subclavian1       0      Multiphasic, WNL                    +----------+--------+-------+----------------+-------------------+ +---------+--------+-+--------+-+---------+ VertebralPSV cm/s0EDV cm/s0Antegrade +---------+--------+-+--------+-+---------+ Plaque extends 2.58 cms into the ICA Left Carotid Findings: +----------+--------+--------+--------+------------------+--------+           PSV cm/sEDV cm/sStenosisPlaque DescriptionComments +----------+--------+--------+--------+------------------+--------+ CCA Prox  1       0                                          +----------+--------+--------+--------+------------------+--------+ CCA Mid   1       0                                          +----------+--------+--------+--------+------------------+--------+ CCA Distal1       0               heterogenous               +----------+--------+--------+--------+------------------+--------+ ICA Prox  2       0       40-59%  heterogenous               +----------+--------+--------+--------+------------------+--------+ ICA Mid   1       0                                          +----------+--------+--------+--------+------------------+--------+ ICA Distal1       0                                  tortuous +----------+--------+--------+--------+------------------+--------+ ECA       1       0                                          +----------+--------+--------+--------+------------------+--------+ +----------+--------+--------+----------------+-------------------+           PSV cm/sEDV cm/sDescribe        Arm Pressure (mmHG) +----------+--------+--------+----------------+-------------------+ Subclavian1       0       Multiphasic, WNL                    +----------+--------+--------+----------------+-------------------+ +---------+--------+-+--------+-+---------+ VertebralPSV cm/s1EDV cm/s0Antegrade +---------+--------+-+--------+-+---------+ Plaque extends 2.85 cms in to the ICA Summary: Right Carotid: Velocities in the right ICA are consistent with a 1-39% stenosis. Left Carotid: Velocities in the left ICA are consistent with a 40-59% stenosis. Vertebrals:  Bilateral vertebral arteries demonstrate antegrade flow. Subclavians: Normal flow hemodynamics were seen in bilateral subclavian              arteries. *See table(s) above for measurements and observations.  Electronically signed by Deitra Mayo MD on 03/20/2019 at 2:14:30 PM.    Final     ASSESSMENT & PLAN:   1) Severe Iron deficiency anemia 2) Anemia due to CKD PLAN: -Discussed patient's most recent labs from 01/03/2019, WBC at  6.8K, RBC at 9.4, HCT at 29.2, MCV at 77.1, MCHC at 32.2, RDW at 15.8, PLTs at 303.0K, Neutro Rel at 59.3, Lymphs Rel at 25.9, Monocytes Rel at 6.6, Eosinophils Rel at 7.4, Basophils Res at 0.8, Neutro Abs at 4.1K, Lymphs Abs at 1.8K, Monocytes Abs at 0.5K, Eosinophils Abs at 0.5K, Basophils Abs at 0.1K, Sodium at 140, Potassium at 4.5, Chloride at 113, OC2 at 23, Glucose at 119, BUN at 46, Creatinine at 2.70, Total Bilirubin at 0.3, Alkaline Phosphatase at 54, AST at 10, ALT at 10, Total Protein at 7.0, Albumin at 4.3, Calcium at 9.0, GFR at 28.24. -Discussed  04/03/2019 Hep C Antibody is "NON-REACTIVE" -Discussed 04/03/2019 Iron and TIBC is as follows: Iron at 29, TIBC at 362, % Sat at 8 -Discussed 11/02/2018 Vitamin B12 at 296 -Discussed 12/04/2018 Colonoscopy which revealed  "-Two 2 to 3 mm polyps in the descending colon and in the transverse colon, removed with a cold snare. Resected and retrieved. - The examination was otherwise normal on direct and retroflexion views."  -Discusses 12/04/2018 Upper Endoscopy which revealed "-Normal esophagus. - Normal stomach. - Normal examined duodenum. -Biopsies were taken with a cold forceps for evaluation of celiac disease." -Discussed 12/04/2018 Surgical Pathology [phone]) which revealed "1. Surgical [P], colon, descending and transverse, polyp (2) - SESSILE SERRATED POLYP WITHOUT CYTOLOGIC DYSPLASIA - INFLAMMATORY POLYP 2. Surgical [P], duodenum - DUODENAL MUCOSA WITH NO SPECIFIC HISTOPATHOLOGIC CHANGES - NEGATIVE FOR INCREASED INTRAEPITHELIAL LYMPHOCYTES OR VILLOUS ARCHITECTURAL CHANGES." -Due to CKD, Goal Ferritin >100 -Goal Iron Sat >30% -Goal Vitamin B12> 300-400 -Recommended SL Vitamin B12 1000 mcg -Plan to give IV Injectafer weekly x2, after pt speaks with his wife -Will premedicate with Tylenol and antihistamines -If pt declines IV Iron, continue higher dose PO Iron  -May consider erythropoietin injection if continued low Hgb levels after Ferritin & Vitamin B12 optimization -Will get labs today  -Will see back in 4 months with labs   FOLLOW UP: Labs today IV Injectafer weekly x 2 doses starting in 10-12 days RTC with Dr Irene Limbo in 4 months with labs   All of the patients questions were answered with apparent satisfaction. The patient knows to call the clinic with any problems, questions or concerns.  I spent 30 mins counseling the patient face to face. The total time spent in the appointment was 43mins and more than 50% was on counseling and direct patient cares.    Sullivan Lone MD [place] AAHIVMS  [facility] Hematology/Oncology Physician [facility]  (Office):       [phone] (Work cell):  [phone] (Fax):           [phone]  03/29/2019 7:47 PM  I, Yevette Edwards, am acting as a scribe for Dr. Sullivan Lone.   .I have reviewed the above documentation for accuracy and completeness, and I agree with the above. Brunetta Genera MD

## 2019-04-02 NOTE — Progress Notes (Signed)
[facility] Summary Report / Loop Recorder 

## 2019-04-03 ENCOUNTER — Telehealth: Payer: Self-pay | Admitting: Adult Health

## 2019-04-03 ENCOUNTER — Telehealth: Payer: Self-pay | Admitting: Hematology

## 2019-04-03 ENCOUNTER — Inpatient Hospital Stay: Payer: Medicare Other

## 2019-04-03 ENCOUNTER — Inpatient Hospital Stay: Payer: Medicare Other | Attending: Hematology | Admitting: Hematology

## 2019-04-03 ENCOUNTER — Other Ambulatory Visit: Payer: Self-pay

## 2019-04-03 VITALS — BP 169/79 | HR 74 | Temp 99.1°F | Resp 16 | Ht 75.0 in | Wt 218.9 lb

## 2019-04-03 DIAGNOSIS — D631 Anemia in chronic kidney disease: Secondary | ICD-10-CM | POA: Diagnosis present

## 2019-04-03 DIAGNOSIS — N184 Chronic kidney disease, stage 4 (severe): Secondary | ICD-10-CM

## 2019-04-03 DIAGNOSIS — D509 Iron deficiency anemia, unspecified: Secondary | ICD-10-CM | POA: Insufficient documentation

## 2019-04-03 LAB — CBC WITH DIFFERENTIAL/PLATELET
Abs Immature Granulocytes: 0.01 10*3/uL (ref 0.00–0.07)
Basophils Absolute: 0.1 10*3/uL (ref 0.0–0.1)
Basophils Relative: 1 %
Eosinophils Absolute: 0.3 10*3/uL (ref 0.0–0.5)
Eosinophils Relative: 6 %
HCT: 25.7 % — ABNORMAL LOW (ref 39.0–52.0)
Hemoglobin: 7.9 g/dL — ABNORMAL LOW (ref 13.0–17.0)
Immature Granulocytes: 0 %
Lymphocytes Relative: 23 %
Lymphs Abs: 1.3 10*3/uL (ref 0.7–4.0)
MCH: 21.5 pg — ABNORMAL LOW (ref 26.0–34.0)
MCHC: 30.7 g/dL (ref 30.0–36.0)
MCV: 69.8 fL — ABNORMAL LOW (ref 80.0–100.0)
Monocytes Absolute: 0.4 10*3/uL (ref 0.1–1.0)
Monocytes Relative: 6 %
Neutro Abs: 3.6 10*3/uL (ref 1.7–7.7)
Neutrophils Relative %: 64 %
Platelets: 377 10*3/uL (ref 150–400)
RBC: 3.68 MIL/uL — ABNORMAL LOW (ref 4.22–5.81)
RDW: 15 % (ref 11.5–15.5)
WBC: 5.7 10*3/uL (ref 4.0–10.5)
nRBC: 0 % (ref 0.0–0.2)

## 2019-04-03 LAB — CMP (CANCER CENTER ONLY)
ALT: 11 U/L (ref 0–44)
AST: 9 U/L — ABNORMAL LOW (ref 15–41)
Albumin: 4.1 g/dL (ref 3.5–5.0)
Alkaline Phosphatase: 61 U/L (ref 38–126)
Anion gap: 9 (ref 5–15)
BUN: 41 mg/dL — ABNORMAL HIGH (ref 8–23)
CO2: 23 mmol/L (ref 22–32)
Calcium: 9.1 mg/dL (ref 8.9–10.3)
Chloride: 109 mmol/L (ref 98–111)
Creatinine: 2.77 mg/dL — ABNORMAL HIGH (ref 0.61–1.24)
GFR, Est AFR Am: 25 mL/min — ABNORMAL LOW (ref >60)
GFR, Estimated: 22 mL/min — ABNORMAL LOW (ref >60)
Glucose, Bld: 157 mg/dL — ABNORMAL HIGH (ref 70–99)
Potassium: 4.2 mmol/L (ref 3.5–5.1)
Sodium: 141 mmol/L (ref 135–145)
Total Bilirubin: <0.2 mg/dL — ABNORMAL LOW (ref 0.3–1.2)
Total Protein: 7.1 g/dL (ref 6.5–8.1)

## 2019-04-03 LAB — IRON AND TIBC
Iron: 49 ug/dL (ref 42–163)
Saturation Ratios: 13 % — ABNORMAL LOW (ref 20–55)
TIBC: 388 ug/dL (ref 202–409)
UIBC: 338 ug/dL (ref 117–376)

## 2019-04-03 LAB — VITAMIN B12: Vitamin B-12: 327 pg/mL (ref 180–914)

## 2019-04-03 LAB — FERRITIN: Ferritin: 12 ng/mL — ABNORMAL LOW (ref 24–336)

## 2019-04-03 NOTE — Telephone Encounter (Signed)
Copied from [place] [id number]. Topic: General - Other >> Apr 03, 2019 12:25 PM Keene Breath wrote: Reason for CRM: Patient's wife called to ask questions about a referral and to let them know that patient is still having some tremors.  She does not want an appt. Until she has spoken with the nurse.  CB# [phone]

## 2019-04-03 NOTE — Telephone Encounter (Signed)
Called patient regarding upcoming appointments, patient will receive mailed calender and transferred relative to speak with nurse.

## 2019-04-03 NOTE — Patient Instructions (Signed)
Thank you for choosing [facility] to provide your oncology and hematology care.   Should you have questions after your visit to the [facility] ([facility]), please contact this office at [phone] between 8:30 AM and 4:30 PM. Voicemails left after 4:00 PM may not be returned until the following business day. Calls received after 4:30 PM will be answered by an off-site Nurse Triage Line.    Prescription Refills:  Please have your pharmacy contact us directly for most prescription requests.  Contact the office directly for refills of narcotics (pain medications). Allow 48-72 hours for refills.  Appointments: Please contact the [facility] scheduling department [phone] for questions regarding [facility] appointment scheduling.  Contact the schedulers with any scheduling changes so that your appointment can be rescheduled in a timely manner.   Central Scheduling for [place] [phone] - Call to schedule procedures such as PET scans, CT scans, MRI, Ultrasound, etc.  To afford each patient quality time with our providers, please arrive 30 minutes before your scheduled appointment time.  If you arrive late for your appointment, you may be asked to reschedule.  We strive to give you quality time with our providers, and arriving late affects you and other patients whose appointments are after yours. If you are a no show for multiple scheduled visits, you may be dismissed from the clinic at the providers discretion.     Resources: [facility] Social Workers [phone] for additional information on assistance programs --Anne Cunningham/Abigail [place]  [place] DSS  [phone]: Information regarding food stamps, Medicaid, and utility assistance [place] [phone]   [place] [employer]'s shared-ride transportation service for eligible riders who have a disability that prevents them from riding the fixed route bus.   Medicare Rights Center 800-333-4114 Helps people with  Medicare understand their rights and benefits, navigate the Medicare system, and secure the quality healthcare they deserve [employer] 800-227-2345 Assists patients locate various types of support and financial assistance Cancer Care: 1-800-813-HOPE (4673) Provides financial assistance, online support groups, medication/co-pay assistance.      

## 2019-04-04 LAB — INTRINSIC FACTOR ANTIBODIES: Intrinsic Factor: 1 AU/mL (ref 0.0–1.1)

## 2019-04-04 LAB — ERYTHROPOIETIN: Erythropoietin: 60.3 m[IU]/mL — ABNORMAL HIGH (ref 2.6–18.5)

## 2019-04-04 NOTE — Telephone Encounter (Signed)
Spoke to [name].  She stated the tremors have started again.  Pt was awake almost all night on Tuesday night.  [name] is monitoring his fluid intake.  She said pt is complaining "non stop" about his back pain and touching his back a lot.  Blood pressure was under 371 (Systolic) yesterday morning and leveled out yesterday evening.  They did see neurology.  Neuro said they did not see anything that indicates Parkinson's but did say that they believe [name] is on the right track with the tremors.  Also seen Vascular Deitra Mayo) and they didn't say much during the visit.  There was in increase in the left side of his neck but no immediate concern.  Wife is concerned about the plaque deposit in his left eye.  She said she tried to address this with the doctor Scot Dock several times but was told that what he is addressing looks fine.  Scheduled for iron infusions.  Two scheduled so far.  [name] would like to know if [name] thinks this is a good idea.

## 2019-04-05 LAB — ANTI-PARIETAL ANTIBODY: Parietal Cell Antibody-IgG: 0.8 Units (ref 0.0–20.0)

## 2019-04-05 LAB — MULTIPLE MYELOMA PANEL, SERUM
Albumin SerPl Elph-Mcnc: 3.8 g/dL (ref 2.9–4.4)
Albumin/Glob SerPl: 1.4 (ref 0.7–1.7)
Alpha 1: 0.2 g/dL (ref 0.0–0.4)
Alpha2 Glob SerPl Elph-Mcnc: 0.7 g/dL (ref 0.4–1.0)
B-Globulin SerPl Elph-Mcnc: 0.8 g/dL (ref 0.7–1.3)
Gamma Glob SerPl Elph-Mcnc: 1 g/dL (ref 0.4–1.8)
Globulin, Total: 2.8 g/dL (ref 2.2–3.9)
IgA: 148 mg/dL (ref 61–437)
IgG (Immunoglobin G), Serum: 1180 mg/dL (ref 603–1613)
IgM (Immunoglobulin M), Srm: 12 mg/dL — ABNORMAL LOW (ref 15–143)
Total Protein ELP: 6.6 g/dL (ref 6.0–8.5)

## 2019-04-05 NOTE — Telephone Encounter (Signed)
We can see him next week, there isn't much I can do for him today

## 2019-04-05 NOTE — Telephone Encounter (Signed)
Iron infusions are fine to do.  When I reviewed the neurology notes it looks like his tremors stopped when his blood pressure increased.  Are  his blood pressures continuously low at this point?

## 2019-04-05 NOTE — Telephone Encounter (Signed)
Spoke to [name].  Tremor did stop when BP increased.  His blood pressure are NOT continuously staying lay.  Ranging mostly in the 120s-130s.  His recent high was in the 140s (this morning) and his low has once been in the 90s.

## 2019-04-05 NOTE — Telephone Encounter (Signed)
Appt set.  Nothing further needed.

## 2019-04-09 ENCOUNTER — Telehealth: Payer: Self-pay | Admitting: Gastroenterology

## 2019-04-09 ENCOUNTER — Other Ambulatory Visit: Payer: Self-pay

## 2019-04-09 ENCOUNTER — Telehealth (INDEPENDENT_AMBULATORY_CARE_PROVIDER_SITE_OTHER): Payer: Medicare Other | Admitting: Adult Health

## 2019-04-09 DIAGNOSIS — R251 Tremor, unspecified: Secondary | ICD-10-CM | POA: Diagnosis not present

## 2019-04-09 DIAGNOSIS — H34212 Partial retinal artery occlusion, left eye: Secondary | ICD-10-CM

## 2019-04-09 NOTE — Progress Notes (Signed)
Virtual Visit via Telephone Note  I connected with [name] [name] on 04/09/19 at 10:00 AM EDT by telephone and verified that I am speaking with the correct person using two identifiers.   I discussed the limitations, risks, security and privacy concerns of performing an evaluation and management service by telephone and the availability of in person appointments. I also discussed with the patient that there may be a patient responsible charge related to this service. The patient expressed understanding and agreed to proceed.  Location patient: home Location provider: work or home office Participants present for the call: patient, provider, wife  Patient did not have a visit in the prior 7 days to address this/these issue(s).   History of Present Illness: 73 year old male who  has a past medical history of Anemia, DIABETES MELLITUS, TYPE II (01/02/2007), HYPERLIPIDEMIA (06/18/2007), HYPERTENSION (01/02/2007), RENAL INSUFFICIENCY (02/05/2008), Stroke ([place]), and Stroke ([place]) (2019).  He is being evaluated today for multiple issues including tremor.  He was seen by neurology back in August 2020 after he started having intermittent tremors which initially started in the right arm but subsequently it was involving both arms as well as the legs.  His tremors were short acting and then would stop.  At this time he was evaluated by this writer and was found to have orthostasis blood pressure medication was adjusted and his tremors subsequently stopped.  Neurology did not see any signs of Parkinson's-like symptoms.  And thought that with his tremors were likely due to low blood pressure.  Reports that over the last week his tremors have returned intermittently and are only lasting for a few moments.  Last had tremors 4 days ago.  Time his blood pressures were staying stable in the 120s to 130s over 80s.  Blood sugar was within normal limits.  Additionally, he was seen by ophthalmologist who noticed that he had a  Hollenhorst plaque in the left eye, but he had no symptoms of vision loss.  He has been seen by vein and vascular, and he and his wife feel as though this was not addressed at the appointment.  They would like to see have a second opinion with another vascular surgeon  His Vascular US in 03/2019 showed.   Right Carotid: Velocities in the right ICA are consistent with a 1-39% stenosis.  Left Carotid: Velocities in the left ICA are consistent with a 40-59% stenosis.  Vertebrals:  Bilateral vertebral arteries demonstrate antegrade flow. Subclavians: Normal flow hemodynamics were seen in bilateral subclavian              arteries.    Observations/Objective: Patient sounds cheerful and well on the phone. I do not appreciate any SOB. Speech and thought processing are grossly intact. Patient reported vitals:  Assessment and Plan: 1. Tremor - Resolved for the time being. Advised strict blood pressure control.  - Follow up if tremor returns   2. Hollenhorst plaque, left eye  - Ambulatory referral to Vascular Surgery   Follow Up Instructions:  I did not refer this patient for an OV in the next 24 hours for this/these issue(s).  I discussed the assessment and treatment plan with the patient. The patient was provided an opportunity to ask questions and all were answered. The patient agreed with the plan and demonstrated an understanding of the instructions.   The patient was advised to call back or seek an in-person evaluation if the symptoms worsen or if the condition fails to improve as anticipated.  I provided 30 minutes of non-face-to-face time during this encounter.   Dorothyann Peng, NP

## 2019-04-09 NOTE — Telephone Encounter (Signed)
Pt is to complete 2 set of stool cards per OV note. Wife aware.

## 2019-04-17 ENCOUNTER — Inpatient Hospital Stay: Payer: Medicare Other | Attending: Hematology

## 2019-04-17 ENCOUNTER — Other Ambulatory Visit: Payer: Self-pay

## 2019-04-17 VITALS — BP 185/87 | HR 53 | Temp 98.2°F | Resp 18

## 2019-04-17 DIAGNOSIS — N184 Chronic kidney disease, stage 4 (severe): Secondary | ICD-10-CM | POA: Diagnosis not present

## 2019-04-17 DIAGNOSIS — D631 Anemia in chronic kidney disease: Secondary | ICD-10-CM

## 2019-04-17 DIAGNOSIS — D509 Iron deficiency anemia, unspecified: Secondary | ICD-10-CM | POA: Insufficient documentation

## 2019-04-17 MED ORDER — ACETAMINOPHEN 325 MG PO TABS
650.0000 mg | ORAL_TABLET | Freq: Once | ORAL | Status: AC
  Administered 2019-04-17: 650 mg via ORAL

## 2019-04-17 MED ORDER — ACETAMINOPHEN 325 MG PO TABS
ORAL_TABLET | ORAL | Status: AC
  Filled 2019-04-17: qty 2

## 2019-04-17 MED ORDER — LORATADINE 10 MG PO TABS
10.0000 mg | ORAL_TABLET | Freq: Once | ORAL | Status: AC
  Administered 2019-04-17: 10 mg via ORAL

## 2019-04-17 MED ORDER — SODIUM CHLORIDE 0.9 % IV SOLN
750.0000 mg | Freq: Once | INTRAVENOUS | Status: AC
  Administered 2019-04-17: 750 mg via INTRAVENOUS
  Filled 2019-04-17: qty 15

## 2019-04-17 MED ORDER — SODIUM CHLORIDE 0.9 % IV SOLN
Freq: Once | INTRAVENOUS | Status: AC
  Administered 2019-04-17: 10:00:00 via INTRAVENOUS
  Filled 2019-04-17: qty 250

## 2019-04-17 NOTE — Patient Instructions (Signed)

## 2019-04-17 NOTE — Progress Notes (Signed)
[name] [name] notified of Pt's BP. Pt states that he takes BP medications at lunch time. Pt's wife called and made aware that patient needs to take BP as soon as he gets home and notify PCP if BP remains elevated.

## 2019-04-19 ENCOUNTER — Other Ambulatory Visit: Payer: Medicare Other

## 2019-04-19 DIAGNOSIS — D509 Iron deficiency anemia, unspecified: Secondary | ICD-10-CM

## 2019-04-19 LAB — HEMOCCULT SLIDES (X 3 CARDS)
Fecal Occult Blood: NEGATIVE
Fecal Occult Blood: NEGATIVE
OCCULT 1: NEGATIVE
OCCULT 1: NEGATIVE
OCCULT 2: NEGATIVE
OCCULT 2: NEGATIVE
OCCULT 3: NEGATIVE
OCCULT 3: NEGATIVE
OCCULT 4: NEGATIVE
OCCULT 4: NEGATIVE
OCCULT 5: NEGATIVE
OCCULT 5: NEGATIVE

## 2019-04-24 ENCOUNTER — Other Ambulatory Visit: Payer: Self-pay

## 2019-04-24 ENCOUNTER — Inpatient Hospital Stay: Payer: Medicare Other

## 2019-04-24 VITALS — BP 179/86 | HR 55 | Temp 98.0°F | Resp 18

## 2019-04-24 DIAGNOSIS — D509 Iron deficiency anemia, unspecified: Secondary | ICD-10-CM

## 2019-04-24 DIAGNOSIS — D631 Anemia in chronic kidney disease: Secondary | ICD-10-CM

## 2019-04-24 DIAGNOSIS — N184 Chronic kidney disease, stage 4 (severe): Secondary | ICD-10-CM

## 2019-04-24 MED ORDER — ACETAMINOPHEN 325 MG PO TABS
ORAL_TABLET | ORAL | Status: AC
  Filled 2019-04-24: qty 2

## 2019-04-24 MED ORDER — ACETAMINOPHEN 325 MG PO TABS
650.0000 mg | ORAL_TABLET | Freq: Once | ORAL | Status: AC
  Administered 2019-04-24: 08:00:00 650 mg via ORAL

## 2019-04-24 MED ORDER — LORATADINE 10 MG PO TABS
10.0000 mg | ORAL_TABLET | Freq: Once | ORAL | Status: AC
  Administered 2019-04-24: 10 mg via ORAL

## 2019-04-24 MED ORDER — SODIUM CHLORIDE 0.9 % IV SOLN
Freq: Once | INTRAVENOUS | Status: AC
  Administered 2019-04-24: 08:00:00 via INTRAVENOUS
  Filled 2019-04-24: qty 250

## 2019-04-24 MED ORDER — SODIUM CHLORIDE 0.9 % IV SOLN
750.0000 mg | Freq: Once | INTRAVENOUS | Status: AC
  Administered 2019-04-24: 09:00:00 750 mg via INTRAVENOUS
  Filled 2019-04-24: qty 15

## 2019-04-24 MED ORDER — LORATADINE 10 MG PO TABS
ORAL_TABLET | ORAL | Status: AC
  Filled 2019-04-24: qty 1

## 2019-04-24 NOTE — Patient Instructions (Signed)
Ferric carboxymaltose injection What is this medicine? FERRIC CARBOXYMALTOSE (ferr-ik car-box-ee-mol-toes) is an iron complex. Iron is used to make healthy red blood cells, which carry oxygen and nutrients throughout the body. This medicine is used to treat anemia in people with chronic kidney disease or people who cannot take iron by mouth. This medicine may be used for other purposes; ask your health care provider or pharmacist if you have questions. COMMON BRAND NAME(S): Injectafer What should I tell my health care provider before I take this medicine? They need to know if you have any of these conditions:  high levels of iron in the blood  liver disease  an unusual or allergic reaction to iron, other medicines, foods, dyes, or preservatives  pregnant or trying to get pregnant  breast-feeding How should I use this medicine? This medicine is for infusion into a vein. It is given by a health care professional in a hospital or clinic setting. Talk to your pediatrician regarding the use of this medicine in children. Special care may be needed. Overdosage: If you think you have taken too much of this medicine contact a poison control center or emergency room at once. NOTE: This medicine is only for you. Do not share this medicine with others. What if I miss a dose? It is important not to miss your dose. Call your doctor or health care professional if you are unable to keep an appointment. What may interact with this medicine? Do not take this medicine with any of the following medications:  deferoxamine  dimercaprol  other iron products This list may not describe all possible interactions. Give your health care provider a list of all the medicines, herbs, non-prescription drugs, or dietary supplements you use. Also tell them if you smoke, drink alcohol, or use illegal drugs. Some items may interact with your medicine. What should I watch for while using this medicine? Visit your  doctor or health care professional regularly. Tell your doctor if your symptoms do not start to get better or if they get worse. You may need blood work done while you are taking this medicine. You may need to follow a special diet. Talk to your doctor. Foods that contain iron include: whole grains/cereals, dried fruits, beans, or peas, leafy green vegetables, and organ meats (liver, kidney). What side effects may I notice from receiving this medicine? Side effects that you should report to your doctor or health care professional as soon as possible:  allergic reactions like skin rash, itching or hives, swelling of the face, lips, or tongue  dizziness  facial flushing Side effects that usually do not require medical attention (report to your doctor or health care professional if they continue or are bothersome):  changes in taste  constipation  headache  nausea, vomiting  pain, redness, or irritation at site where injected This list may not describe all possible side effects. Call your doctor for medical advice about side effects. You may report side effects to FDA at 1-800-FDA-1088. Where should I keep my medicine? This drug is given in a hospital or clinic and will not be stored at home. NOTE: This sheet is a summary. It may not cover all possible information. If you have questions about this medicine, talk to your doctor, pharmacist, or health care provider.  2020 Elsevier/Gold Standard (2016-08-04 09:40:29)  Coronavirus (COVID-19) Are you at risk?  Are you at risk for the Coronavirus (COVID-19)?  To be considered HIGH RISK for Coronavirus (COVID-19), you have to meet the following   criteria:  . Traveled to [place], [place], [place], [place] or [place]; or in the [place] to [place], [place], [place], or [place]; and have fever, cough, and shortness of breath within the last 2 weeks of travel OR . Been in close contact with a person diagnosed with COVID-19 within the  last 2 weeks and have fever, cough, and shortness of breath . IF YOU DO NOT MEET THESE CRITERIA, YOU ARE CONSIDERED LOW RISK FOR COVID-19.  What to do if you are HIGH RISK for COVID-19?  . If you are having a medical emergency, call 911. . Seek medical care right away. Before you go to a doctor's office, urgent care or emergency department, call ahead and tell them about your recent travel, contact with someone diagnosed with COVID-19, and your symptoms. You should receive instructions from your physician's office regarding next steps of care.  . When you arrive at healthcare provider, tell the healthcare staff immediately you have returned from visiting [place], [place], [place], [place] or [place]; or traveled in the [place] to [place], [place], [place], or [place]; in the last two weeks or you have been in close contact with a person diagnosed with COVID-19 in the last 2 weeks.   . Tell the health care staff about your symptoms: fever, cough and shortness of breath. . After you have been seen by a medical provider, you will be either: o Tested for (COVID-19) and discharged home on quarantine except to seek medical care if symptoms worsen, and asked to  - Stay home and avoid contact with others until you get your results (4-5 days)  - Avoid travel on public transportation if possible (such as bus, train, or airplane) or o Sent to the Emergency Department by EMS for evaluation, COVID-19 testing, and possible admission depending on your condition and test results.  What to do if you are LOW RISK for COVID-19?  Reduce your risk of any infection by using the same precautions used for avoiding the common cold or flu:  . Wash your hands often with soap and warm water for at least 20 seconds.  If soap and water are not readily available, use an alcohol-based hand sanitizer with at least 60% alcohol.  . If coughing or sneezing, cover your mouth and nose by coughing or sneezing into the elbow  areas of your shirt or coat, into a tissue or into your sleeve (not your hands). . Avoid shaking hands with others and consider head nods or verbal greetings only. . Avoid touching your eyes, nose, or mouth with unwashed hands.  . Avoid close contact with people who are sick. . Avoid places or events with large numbers of people in one location, like concerts or sporting events. . Carefully consider travel plans you have or are making. . If you are planning any travel outside or inside the [place], visit the CDC's Travelers' Health webpage for the latest health notices. . If you have some symptoms but not all symptoms, continue to monitor at home and seek medical attention if your symptoms worsen. . If you are having a medical emergency, call 911.   ADDITIONAL HEALTHCARE OPTIONS FOR PATIENTS  [facility] / e-Visit: [email or web address]         [facility]: [phone]  [facility]: [phone]                   [facility]: [phone]   

## 2019-04-29 ENCOUNTER — Ambulatory Visit (INDEPENDENT_AMBULATORY_CARE_PROVIDER_SITE_OTHER): Payer: Medicare Other | Admitting: *Deleted

## 2019-04-29 DIAGNOSIS — I6930 Unspecified sequelae of cerebral infarction: Secondary | ICD-10-CM | POA: Diagnosis not present

## 2019-04-30 LAB — CUP PACEART REMOTE DEVICE CHECK
Date Time Interrogation Session: 20201027003719
Implantable Pulse Generator Implant Date: 20190108

## 2019-05-16 ENCOUNTER — Telehealth: Payer: Self-pay | Admitting: Family Medicine

## 2019-05-16 ENCOUNTER — Other Ambulatory Visit: Payer: Self-pay | Admitting: Adult Health

## 2019-05-16 DIAGNOSIS — I129 Hypertensive chronic kidney disease with stage 1 through stage 4 chronic kidney disease, or unspecified chronic kidney disease: Secondary | ICD-10-CM

## 2019-05-16 DIAGNOSIS — E1169 Type 2 diabetes mellitus with other specified complication: Secondary | ICD-10-CM

## 2019-05-16 NOTE — Telephone Encounter (Signed)
Spoke to [name].  She stated the patient's blood pressure has been "wonky" and his blood glucose has been all over the place.  I have him coming for lab work on 05/17/2019 to check his A1C.  Will see if [name] would like to place additional orders.  Scheduled for a virtual visit on 05/17/2019 to discuss all with [facility].

## 2019-05-16 NOTE — Progress Notes (Signed)
[facility] Summary Report / Loop Recorder 

## 2019-05-16 NOTE — Telephone Encounter (Signed)
Copied from [place] [id number]. Topic: General - Inquiry >> May 16, 2019 12:33 PM Alease Frame wrote: Reason for CRM: Patient wife called in to set up an appt . States patient is not feeling well and wanted to see about changing medications if possible. Please advise

## 2019-05-17 ENCOUNTER — Other Ambulatory Visit: Payer: Self-pay

## 2019-05-17 ENCOUNTER — Other Ambulatory Visit (INDEPENDENT_AMBULATORY_CARE_PROVIDER_SITE_OTHER): Payer: Medicare Other

## 2019-05-17 ENCOUNTER — Telehealth (INDEPENDENT_AMBULATORY_CARE_PROVIDER_SITE_OTHER): Payer: Medicare Other | Admitting: Adult Health

## 2019-05-17 DIAGNOSIS — I129 Hypertensive chronic kidney disease with stage 1 through stage 4 chronic kidney disease, or unspecified chronic kidney disease: Secondary | ICD-10-CM

## 2019-05-17 DIAGNOSIS — E669 Obesity, unspecified: Secondary | ICD-10-CM

## 2019-05-17 DIAGNOSIS — E1169 Type 2 diabetes mellitus with other specified complication: Secondary | ICD-10-CM

## 2019-05-17 LAB — BASIC METABOLIC PANEL
BUN: 41 mg/dL — ABNORMAL HIGH (ref 6–23)
CO2: 25 mEq/L (ref 19–32)
Calcium: 9 mg/dL (ref 8.4–10.5)
Chloride: 107 mEq/L (ref 96–112)
Creatinine, Ser: 2.56 mg/dL — ABNORMAL HIGH (ref 0.40–1.50)
GFR: 30 mL/min — ABNORMAL LOW (ref >60.00)
Glucose, Bld: 159 mg/dL — ABNORMAL HIGH (ref 70–99)
Potassium: 4 mEq/L (ref 3.5–5.1)
Sodium: 141 mEq/L (ref 135–145)

## 2019-05-17 LAB — HEMOGLOBIN A1C: Hgb A1c MFr Bld: 5.4 % (ref 4.6–6.5)

## 2019-05-17 NOTE — Progress Notes (Signed)
Virtual Visit via Telephone Note  I connected with [name] [name] on 05/17/19 at  4:00 PM EST by telephone and verified that I am speaking with the correct person using two identifiers.   I discussed the limitations, risks, security and privacy concerns of performing an evaluation and management service by telephone and the availability of in person appointments. I also discussed with the patient that there may be a patient responsible charge related to this service. The patient expressed understanding and agreed to proceed.  Location patient: home Location provider: work or home office Participants present for the call: patient, provider, wife Patient did not have a visit in the prior 7 days to address this/these issue(s).   History of Present Illness: 73 year old male who is being evaluated today for diabetes and hypertension.  His blood pressure is managed by nephrology.  He reports that his blood pressures have been varying between 1 30-1 60 over 70s to 90s.  Reports no recent changes in medications by nephrology.  He does have an appointment in 1 week with them.  Also he reports that his blood sugars have been slightly irregular.  He has been having readings between 100-130 with most blood sugars being on the low end.  He had his A1c done earlier today which showed a reading of 5.4, this was down from 6.4,4 months prior.  He is currently prescribed Starlix 60 mg daily and Lantus 6 units at bedtime.  He does have issues with "not feeling good" I.e. feeling lethargic his blood sugars are running low and blood pressures are running high.   Observations/Objective: Patient sounds cheerful and well on the phone. I do not appreciate any SOB. Speech and thought processing are grossly intact. Patient reported vitals:  Assessment and Plan: 1. Diabetes mellitus type 2 in obese ([place]) -Going to have him decrease Lantus by 3 units.  Continue to monitor blood sugars at home and he will let me know  if blood sugars are consistently above 150.  2. Hypertensive kidney disease -We will have him follow-up with nephrology next week for further management of blood pressure   Follow Up Instructions:  I did not refer this patient for an OV in the next 24 hours for this/these issue(s).  I discussed the assessment and treatment plan with the patient. The patient was provided an opportunity to ask questions and all were answered. The patient agreed with the plan and demonstrated an understanding of the instructions.   The patient was advised to call back or seek an in-person evaluation if the symptoms worsen or if the condition fails to improve as anticipated.  I provided 30  minutes of non-face-to-face time during this encounter.   Dorothyann Peng, NP

## 2019-05-24 LAB — BASIC METABOLIC PANEL
BUN: 37 — AB (ref 4–21)
CO2: 23 — AB (ref 13–22)
Chloride: 107 (ref 99–108)
Creatinine: 2.4 — AB (ref 0.6–1.3)
Glucose: 151
Potassium: 4.3 (ref 3.4–5.3)
Sodium: 137 (ref 137–147)

## 2019-05-24 LAB — CBC: RBC: 4.47 (ref 3.87–5.11)

## 2019-05-24 LAB — CBC AND DIFFERENTIAL
HCT: 35 — AB (ref 41–53)
Hemoglobin: 10.9 — AB (ref 13.5–17.5)
Neutrophils Absolute: 3
Platelets: 231 (ref 150–399)
WBC: 5.7

## 2019-05-24 LAB — COMPREHENSIVE METABOLIC PANEL
Albumin: 4.2 (ref 3.5–5.0)
Calcium: 9.1 (ref 8.7–10.7)
GFR calc Af Amer: 30

## 2019-06-02 LAB — CUP PACEART REMOTE DEVICE CHECK
Date Time Interrogation Session: 20201128193820
Implantable Pulse Generator Implant Date: 20190108

## 2019-06-03 ENCOUNTER — Ambulatory Visit (INDEPENDENT_AMBULATORY_CARE_PROVIDER_SITE_OTHER): Payer: Medicare Other | Admitting: *Deleted

## 2019-06-03 DIAGNOSIS — I63512 Cerebral infarction due to unspecified occlusion or stenosis of left middle cerebral artery: Secondary | ICD-10-CM

## 2019-06-05 ENCOUNTER — Other Ambulatory Visit: Payer: Self-pay | Admitting: Family Medicine

## 2019-06-05 MED ORDER — EZETIMIBE 10 MG PO TABS: ORAL_TABLET | ORAL | 3 refills | Status: DC

## 2019-06-13 ENCOUNTER — Telehealth: Payer: Self-pay | Admitting: *Deleted

## 2019-06-13 ENCOUNTER — Telehealth (INDEPENDENT_AMBULATORY_CARE_PROVIDER_SITE_OTHER): Payer: Medicare Other | Admitting: Adult Health

## 2019-06-13 ENCOUNTER — Other Ambulatory Visit: Payer: Self-pay

## 2019-06-13 DIAGNOSIS — E1169 Type 2 diabetes mellitus with other specified complication: Secondary | ICD-10-CM

## 2019-06-13 DIAGNOSIS — E669 Obesity, unspecified: Secondary | ICD-10-CM | POA: Diagnosis not present

## 2019-06-13 NOTE — Telephone Encounter (Addendum)
Pt scheduled for virtual appointment to see [facility] today at 3:30.

## 2019-06-13 NOTE — Progress Notes (Signed)
Virtual Visit via Telephone Note  I connected with [name] [name] on 06/13/19 at  3:30 PM EST by telephone and verified that I am speaking with the correct person using two identifiers.   I discussed the limitations, risks, security and privacy concerns of performing an evaluation and management service by telephone and the availability of in person appointments. I also discussed with the patient that there may be a patient responsible charge related to this service. The patient expressed understanding and agreed to proceed.  Location patient: home Location provider: work or home office Participants present for the call: patient, provider Patient did not have a visit in the prior 7 days to address this/these issue(s).   History of Present Illness: 73 year old male who  has a past medical history of Anemia, DIABETES MELLITUS, TYPE II (01/02/2007), HYPERLIPIDEMIA (06/18/2007), HYPERTENSION (01/02/2007), RENAL INSUFFICIENCY (02/05/2008), Stroke ([place]), and Stroke ([place]) (2019).   was seen about a month ago for irregular blood sugars. He was getting readings at home between 100-130 with most readings being on the low end. Lantus was decreased to 3 units. During this time his blood sugars elevated again and he increased the lantus back to 160 about two weeks ago. In that time he has been having blood sugar readings between 140-160, with most being above 150.    Observations/Objective: Patient sounds cheerful and well on the phone. I do not appreciate any SOB. Speech and thought processing are grossly intact. Patient reported vitals:  Assessment and Plan: 1. Diabetes mellitus type 2 in obese ([place]) - Ok to increase Lantus to 7-8 units as needed.  - Follow up if BS not controlled.  - Continue to eat healthy  Follow Up Instructions:   I did not refer this patient for an OV in the next 24 hours for this/these issue(s).  I discussed the assessment and treatment plan with the patient. The patient  was provided an opportunity to ask questions and all were answered. The patient agreed with the plan and demonstrated an understanding of the instructions.   The patient was advised to call back or seek an in-person evaluation if the symptoms worsen or if the condition fails to improve as anticipated.  I provided 15 minutes of non-face-to-face time during this encounter.   Dorothyann Peng, NP

## 2019-06-13 NOTE — Telephone Encounter (Signed)
Copied from [place] [id number]. Topic: General - Inquiry >> Jun 13, 2019  9:10 AM Richardo Priest, NT wrote: Reason for CRM: Pt's wife called in stating patient's blood sugars and blood pressures have been ranging again and they would like a call back in regards to what to change. Please advise.

## 2019-06-17 ENCOUNTER — Encounter (INDEPENDENT_AMBULATORY_CARE_PROVIDER_SITE_OTHER): Payer: Medicare Other | Admitting: Ophthalmology

## 2019-06-17 ENCOUNTER — Other Ambulatory Visit: Payer: Self-pay

## 2019-06-17 DIAGNOSIS — I10 Essential (primary) hypertension: Secondary | ICD-10-CM

## 2019-06-17 DIAGNOSIS — H33301 Unspecified retinal break, right eye: Secondary | ICD-10-CM

## 2019-06-17 DIAGNOSIS — E11319 Type 2 diabetes mellitus with unspecified diabetic retinopathy without macular edema: Secondary | ICD-10-CM | POA: Diagnosis not present

## 2019-06-17 DIAGNOSIS — E113293 Type 2 diabetes mellitus with mild nonproliferative diabetic retinopathy without macular edema, bilateral: Secondary | ICD-10-CM | POA: Diagnosis not present

## 2019-06-17 DIAGNOSIS — H43813 Vitreous degeneration, bilateral: Secondary | ICD-10-CM

## 2019-06-17 DIAGNOSIS — H2513 Age-related nuclear cataract, bilateral: Secondary | ICD-10-CM

## 2019-06-17 DIAGNOSIS — H35033 Hypertensive retinopathy, bilateral: Secondary | ICD-10-CM

## 2019-06-26 NOTE — Progress Notes (Signed)
ILR remote 

## 2019-07-03 ENCOUNTER — Encounter: Payer: Self-pay | Admitting: Family Medicine

## 2019-07-04 ENCOUNTER — Ambulatory Visit (INDEPENDENT_AMBULATORY_CARE_PROVIDER_SITE_OTHER): Payer: Medicare Other | Admitting: *Deleted

## 2019-07-04 DIAGNOSIS — I63512 Cerebral infarction due to unspecified occlusion or stenosis of left middle cerebral artery: Secondary | ICD-10-CM

## 2019-07-05 LAB — CUP PACEART REMOTE DEVICE CHECK
Date Time Interrogation Session: 20201231212831
Implantable Pulse Generator Implant Date: 20190108

## 2019-07-09 ENCOUNTER — Telehealth: Payer: Self-pay

## 2019-07-09 NOTE — Telephone Encounter (Signed)
Spoke to pt spouse and she stated they are still having trouble with pt blood sugar and Bp readings.   BP readings: Last 3 readings   07/08/2019 am 167/86 HR 62 07/08/2019 pm 188/100 HR 59 07/09/2019 153/85 HR 63 Pt spouse stated she changed the batteries this a.m.  Pt does have a older cuff  Blood sugar readings: Last 3 readings   07/08/2019 156  07/08/2019 148  07/09/2019 128    Please advise

## 2019-07-09 NOTE — Telephone Encounter (Signed)
Copied from [place] [id number]. Topic: General - Other >> Jul 09, 2019  2:48 PM Rainey Pines A wrote: Patients wife called in regards to patients levels being checked and she would like to speak with a nurse in regards to patient. Please advise

## 2019-07-10 NOTE — Telephone Encounter (Signed)
Please have them follow up with Nephrology do to BP  OK to increase lantus to 10 units

## 2019-07-10 NOTE — Telephone Encounter (Signed)
Noted  

## 2019-07-10 NOTE — Telephone Encounter (Signed)
Pt spouse notified of update.  

## 2019-08-01 NOTE — Progress Notes (Signed)
HEMATOLOGY/ONCOLOGY CLINIC NOTE  Date of Service: 08/02/2019  Patient Care Team: Dorothyann Peng, NP as PCP - General (Family Medicine) Adegoroye, Wynona Luna, MD (Nephrology)  CHIEF COMPLAINTS/PURPOSE OF CONSULTATION:  IDA  HISTORY OF PRESENTING ILLNESS:   [name] [name] is a wonderful 74 y.o. male who has been referred to [place] by Dorothyann Peng NP for evaluation and management of Iron Deficiency Anemia. The pt reports that he is doing well overall.   The pt reports that he was first told that he had IDA about 3 months ago when he got labs with his PCP. Pt then saw a GI and received a Upper Endoscopy and a Colonoscopy with biopsy. He denies any bleeding concerns or bloody stools. Pt has taken home a Hemoccult test but it has not resulted. Pt has taken Asprin since his stroke in 2019. His stroke did cause residual slurred speech but he denies any other residual changes such as vision changes or neuromotor deficits. He notes that his Optometrist saw particles in his eye after his stroke. Pt also notes intermittent chronic back pain. 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 Pt has not been on any long-term acid suppressants. He has no dietary restrictions and can eat bread and other gluten products with no digestive issues. Pt's understanding is that his CKD was caused by his HTN and Diabetes. Pt reports that his Diabetes and HTN have been moderately well controlled. Pt has been taking a Vitamin D replacement for some time and has been taking PO Iron for at least 6 months. Asprin is the only blood thinner that he is currently taking. Pt has seen a Urologist recently but has not seen Nephrologist in the last few years. He states that he has been eating well and has had no recent unexpected weight loss.   Of note prior to the patient's visit today, pt has had Colonoscopy completed on 12/04/2018 with results revealing "-Two 2 to  3 mm polyps in the descending colon and in the transverse colon, removed with a cold snare. Resected and retrieved. - The examination was otherwise normal on direct and retroflexion views."   Pt has had Upper Endoscopy completed on 12/04/2018 with results revealing "-Normal esophagus. - Normal stomach. - Normal examined duodenum. - Biopsies were taken with a cold forceps for evaluation of celiac disease."  Pt has had Surgical Pathology [phone]) completed on 12/04/2018 with results revealing "1. Surgical [P], colon, descending and transverse, polyp (2) - SESSILE SERRATED POLYP WITHOUT CYTOLOGIC DYSPLASIA - INFLAMMATORY POLYP 2. Surgical [P], duodenum - DUODENAL MUCOSA WITH NO SPECIFIC HISTOPATHOLOGIC CHANGES - NEGATIVE FOR INCREASED INTRAEPITHELIAL LYMPHOCYTES OR VILLOUS ARCHITECTURAL CHANGES."  Most recent lab results (01/03/2019) of CBC w/diff & CMP is as follows: WBC at 6.8K, RBC at 9.4, HCT at 29.2, MCV at 77.1, MCHC at 32.2, RDW at 15.8, PLTs at 303.0K, Neutro Rel at 59.3, Lymphs Rel at 25.9, Monocytes Rel at 6.6, Eosinophils Rel at 7.4, Basophils Res at 0.8, Neutro Abs at 4.1K, Lymphs Abs at 1.8K, Monocytes Abs at 0.5K, Eosinophils Abs at 0.5K, Basophils Abs at 0.1K, Sodium at 140, Potassium at 4.5, Chloride at 113, OC2 at 23, Glucose at 119, BUN at 46, Creatinine at 2.70, Total Bilirubin at 0.3, Alkaline Phosphatase at 54, AST at 10, ALT at 10, Total Protein at 7.0, Albumin at 4.3, Calcium at 9.0, GFR at 28.24. 04/03/2019 Hep C Antibody is "NON-REACTIVE" 04/03/2019 Iron and  TIBC is as follows: Iron at 29, TIBC at 362, % Sat at 8  On review of systems, pt reports back pain and denies abdominal pain, leg swelling, unexpected weight loss and any other symptoms.   On PMHx the pt reports CKD, Diabetes, HTN, Stroke (2019). 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   INTERVAL HISTORY:  [name] [name] is a 74 y.o. male here for evaluation and management of  IDA. The patient's last visit with [place] was on 04/03/2019. The pt reports that he is doing well overall. He is accompanied by his wife [name] via phone call.  The pt reports he has been feeling good.  He tolerated the IV Iron well and he noticed an improvement in his energy levels with in a couple months after receiving  IV Iron.   Recently he has noticed a decrease in energy.  He is still taking aspirin and iron pills  He needs a copy of his blood counts and chemistries sent to Dr.Adeyinka Adegoroye.   Lab results today (08/02/19) of CBC w/diff and CMP is as follows: all values are WNL except for Hemoglobin at 11.6, HCT at 34.9, MCV at 79.0, RDW at 15.6.  On review of systems, pt reports moving bowels okay and denies blood in stools, black stools, abdominal pain, leg swelling and any other symptoms.   MEDICAL HISTORY:  Past Medical History:  Diagnosis Date  . Anemia   . DIABETES MELLITUS, TYPE II 01/02/2007  . HYPERLIPIDEMIA 06/18/2007  . HYPERTENSION 01/02/2007  . RENAL INSUFFICIENCY 02/05/2008  . Stroke ([place])   . Stroke [facility]) 2019    SURGICAL HISTORY: Past Surgical History:  Procedure Laterality Date  . LOOP RECORDER INSERTION N/A 07/11/2017   Procedure: LOOP RECORDER INSERTION;  Surgeon: Thompson Grayer, MD;  Location: [facility];  Service: Cardiovascular;  Laterality: N/A;  . SHOULDER SURGERY     left  . TEE WITHOUT CARDIOVERSION N/A 07/11/2017   Procedure: TRANSESOPHAGEAL ECHOCARDIOGRAM (TEE);  Surgeon: Dorothy Spark, MD;  Location: [facility];  Service: Cardiovascular;  Laterality: N/A;  . TRANSURETHRAL RESECTION OF PROSTATE     history of retention/hematuria     SOCIAL HISTORY: Social History   Socioeconomic History  . Marital status: Married    Spouse name: Not on file  . Number of children: Not on file  . Years of education: Not on file  . Highest education level: Not on file  Occupational History  . Not on file  Tobacco Use  . Smoking status: Never  Smoker  . Smokeless tobacco: Never Used  Substance and Sexual Activity  . Alcohol use: No  . Drug use: No  . Sexual activity: Not on file  Other Topics Concern  . 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 Likes to go to football games.    Social Determinants of Health   Financial Resource Strain:   . Difficulty of Paying Living Expenses: Not on file  Food Insecurity:   . Worried About Charity fundraiser in the Last Year: Not on file  . Ran Out of Food in the Last Year: Not on file  Transportation Needs:   . Lack of Transportation (Medical): Not on file  . Lack of Transportation (  Non-Medical): Not on file  Physical Activity:   . Days of Exercise per Week: Not on file  . Minutes of Exercise per Session: Not on file  Stress:   . Feeling of Stress : Not on file  Social Connections:   . Frequency of Communication with Friends and Family: Not on file  . Frequency of Social Gatherings with Friends and Family: Not on file  . Attends Religious Services: Not on file  . Active Member of Clubs or Organizations: Not on file  . Attends Archivist Meetings: Not on file  . Marital Status: Not on file  Intimate Partner Violence:   . Fear of Current or Ex-Partner: Not on file  . Emotionally Abused: Not on file  . Physically Abused: Not on file  . Sexually Abused: Not on file    FAMILY HISTORY: Family History  Problem Relation Age of Onset  . Hypertension Mother   . Diabetes Mother   . Stroke Father   . Stroke Maternal Grandmother   . Colon cancer Neg Hx   . Esophageal cancer Neg Hx   . Rectal cancer Neg Hx   . Stomach cancer Neg Hx     ALLERGIES:  is allergic to repatha [evolocumab]; bactrim [sulfamethoxazole-trimethoprim]; penicillins; statins; and sulfa drugs cross reactors.  MEDICATIONS:  Current Outpatient  Medications  Medication Sig Dispense Refill  . aspirin 325 MG tablet Take 325 mg by mouth daily.    . calcitRIOL (ROCALTROL) 0.25 MCG capsule Take 1 capsule by mouth daily.     Marland Kitchen ezetimibe (ZETIA) 10 MG tablet TAKE 1 TABLET(10 MG) BY MOUTH DAILY 90 tablet 3  . ferrous sulfate 325 (65 FE) MG tablet Take 65 mg by mouth daily.    Marland Kitchen glucose blood test strip CHECK BLOOD SUGARS TWICE DAILY 200 each 3  . Insulin Glargine (LANTUS SOLOSTAR) 100 UNIT/ML Solostar Pen Inject 6 Units into the skin at bedtime. (Patient taking differently: Inject 7-8 Units into the skin at bedtime. ) 15 mL 0  . insulin glargine (LANTUS) 100 UNIT/ML injection Inject 0.06 mLs (6 Units total) into the skin at bedtime. 10 mL 1  . Insulin Pen Needle (B-D UF III MINI PEN NEEDLES) 31G X 5 MM MISC USE TO TEST BLOOD GLUCOSE FOUR TIMES DAILY 400 each 0  . nateglinide (STARLIX) 60 MG tablet Take 60 mg by mouth daily.    . NON FORMULARY     . valsartan (DIOVAN) 160 MG tablet TK 1 T PO QD    . VITAMIN D PO Take 2,000 Units by mouth.     No current facility-administered medications for this visit.    REVIEW OF SYSTEMS:   A 10+ POINT REVIEW OF SYSTEMS WAS OBTAINED including neurology, dermatology, psychiatry, cardiac, respiratory, lymph, extremities, GI, GU, Musculoskeletal, constitutional, breasts, reproductive, HEENT.  All pertinent positives are noted in the HPI.  All others are negative.   PHYSICAL EXAMINATION: ECOG FS:1 - Symptomatic but completely ambulatory  Vitals:   08/02/19 1111  BP: (!) 180/81  Pulse: 64  Resp: 18  Temp: 98.7 F (37.1 C)  SpO2: 100%   Wt Readings from Last 3 Encounters:  08/02/19 227 lb 1.6 oz (103 kg)  04/03/19 218 lb 14.4 oz (99.3 kg)  03/27/19 219 lb (99.3 kg)   Body mass index is 28.39 kg/m.    GENERAL:alert, in no acute distress and comfortable SKIN: no acute rashes, no significant lesions EYES: conjunctiva are pink and non-injected, sclera anicteric OROPHARYNX: MMM, no exudates, no  oropharyngeal erythema or ulceration NECK: supple, no JVD LYMPH:  no palpable lymphadenopathy in the cervical, axillary or inguinal regions LUNGS: clear to auscultation b/l with normal respiratory effort HEART: regular rate & rhythm ABDOMEN:  normoactive bowel sounds , non tender, not distended. Extremity: no pedal edema PSYCH: alert & oriented x 3 with fluent speech NEURO: no focal motor/sensory deficits  LABORATORY DATA:  I have reviewed the data as listed  . CBC Latest Ref Rng & Units 08/02/2019 05/24/2019 04/03/2019  WBC 4.0 - 10.5 K/uL 7.0 5.7 5.7  Hemoglobin 13.0 - 17.0 g/dL 11.6(L) 10.9(A) 7.9(L)  Hematocrit 39.0 - 52.0 % 34.9(L) 35(A) 25.7(L)  Platelets 150 - 400 K/uL 255 231 377   . CBC    Component Value Date/Time   WBC 7.0 08/02/2019 1029   RBC 4.42 08/02/2019 1029   HGB 11.6 (L) 08/02/2019 1029   HCT 34.9 (L) 08/02/2019 1029   PLT 255 08/02/2019 1029   MCV 79.0 (L) 08/02/2019 1029   MCH 26.2 08/02/2019 1029   MCHC 33.2 08/02/2019 1029   RDW 15.6 (H) 08/02/2019 1029   LYMPHSABS 1.6 08/02/2019 1029   MONOABS 0.4 08/02/2019 1029   EOSABS 0.5 08/02/2019 1029   BASOSABS 0.0 08/02/2019 1029    . CMP Latest Ref Rng & Units 05/24/2019 05/17/2019 04/03/2019  Glucose 70 - 99 mg/dL - 159(H) 157(H)  BUN 4 - 21 37(A) 41(H) 41(H)  Creatinine 0.6 - 1.3 2.4(A) 2.56(H) 2.77(H)  Sodium 137 - 147 137 141 141  Potassium 3.4 - 5.3 4.3 4.0 4.2  Chloride 99 - 108 107 107 109  CO2 13 - 22 23(A) 25 23  Calcium 8.7 - 10.7 9.1 9.0 9.1  Total Protein 6.5 - 8.1 g/dL - - 7.1  Total Bilirubin 0.3 - 1.2 mg/dL - - <0.2(L)  Alkaline Phos 38 - 126 U/L - - 61  AST 15 - 41 U/L - - 9(L)  ALT 0 - 44 U/L - - 11   . Lab Results  Component Value Date   IRON 49 04/03/2019   TIBC 388 04/03/2019   IRONPCTSAT 13 (L) 04/03/2019   (Iron and TIBC)  Lab Results  Component Value Date   FERRITIN 12 (L) 04/03/2019   . Lab Results  Component Value Date   IRON 49 04/03/2019   TIBC 388  04/03/2019   IRONPCTSAT 13 (L) 04/03/2019   (Iron and TIBC)  Lab Results  Component Value Date   FERRITIN 29 08/02/2019     12/04/2018 [phone]) Surgical Pathology   12/04/2018 Upper Endoscopy    12/04/2018 Colonoscopy    RADIOGRAPHIC STUDIES: I have personally reviewed the radiological images as listed and agreed with the findings in the report. CUP PACEART REMOTE DEVICE CHECK  Result Date: 07/05/2019 Carelink summary report received. Battery status OK. Normal device function. No new symptom episodes, tachy episodes, brady, or pause episodes. No new AF episodes. Monthly summary reports and ROV/PRN [place]   ASSESSMENT & PLAN:   1) Severe Iron deficiency anemia 2) Anemia due to CKD  PLAN: -Discussed pt labwork today,08/02/19;  all values are WNL except for Hemoglobin at 11.6, HCT at 34.9, MCV at 79.0, RDW at 15.6,  VITAMIN B12- 396 -ferritin is 29. Given ferritin goal of >100 -- would recommend 1additiional dose of IV Injectafer. -Recommended starting a B complex. -Advised that he does not have any antibodies suggestive of pernicious anemia. -Possible adjust oral iron to address iron deficiency pending today's labs. -Will forward a copy of his blood  counts and chemistries to Dr.Adeyinka Adegoroye.  FOLLOW UP: RTC with Dr Irene Limbo with labs in 6 months   The total time spent in the appt was 25 minutes and more than 50% was on counseling and direct patient cares.  All of the patient's questions were answered with apparent satisfaction. The patient knows to call the clinic with any problems, questions or concerns.    Sullivan Lone MD MS AAHIVMS [facility] Hematology/Oncology Physician [facility]  (Office):       [phone] (Work cell):  [phone] (Fax):           [phone]  08/01/2019 10:34 PM  I, Scot Dock, am acting as a scribe for Dr. Sullivan Lone.   .I have reviewed the above documentation for accuracy and completeness, and I agree with the  above. Brunetta Genera MD

## 2019-08-02 ENCOUNTER — Inpatient Hospital Stay: Payer: Medicare Other | Attending: Hematology

## 2019-08-02 ENCOUNTER — Other Ambulatory Visit: Payer: Self-pay

## 2019-08-02 ENCOUNTER — Inpatient Hospital Stay (HOSPITAL_BASED_OUTPATIENT_CLINIC_OR_DEPARTMENT_OTHER): Payer: Medicare Other | Admitting: Hematology

## 2019-08-02 VITALS — BP 180/81 | HR 64 | Temp 98.7°F | Resp 18 | Ht 75.0 in | Wt 227.1 lb

## 2019-08-02 DIAGNOSIS — N189 Chronic kidney disease, unspecified: Secondary | ICD-10-CM | POA: Diagnosis not present

## 2019-08-02 DIAGNOSIS — D509 Iron deficiency anemia, unspecified: Secondary | ICD-10-CM

## 2019-08-02 DIAGNOSIS — D631 Anemia in chronic kidney disease: Secondary | ICD-10-CM

## 2019-08-02 LAB — FERRITIN: Ferritin: 29 ng/mL (ref 24–336)

## 2019-08-02 LAB — CBC WITH DIFFERENTIAL/PLATELET
Abs Immature Granulocytes: 0.02 10*3/uL (ref 0.00–0.07)
Basophils Absolute: 0 10*3/uL (ref 0.0–0.1)
Basophils Relative: 1 %
Eosinophils Absolute: 0.5 10*3/uL (ref 0.0–0.5)
Eosinophils Relative: 7 %
HCT: 34.9 % — ABNORMAL LOW (ref 39.0–52.0)
Hemoglobin: 11.6 g/dL — ABNORMAL LOW (ref 13.0–17.0)
Immature Granulocytes: 0 %
Lymphocytes Relative: 23 %
Lymphs Abs: 1.6 10*3/uL (ref 0.7–4.0)
MCH: 26.2 pg (ref 26.0–34.0)
MCHC: 33.2 g/dL (ref 30.0–36.0)
MCV: 79 fL — ABNORMAL LOW (ref 80.0–100.0)
Monocytes Absolute: 0.4 10*3/uL (ref 0.1–1.0)
Monocytes Relative: 5 %
Neutro Abs: 4.5 10*3/uL (ref 1.7–7.7)
Neutrophils Relative %: 64 %
Platelets: 255 10*3/uL (ref 150–400)
RBC: 4.42 MIL/uL (ref 4.22–5.81)
RDW: 15.6 % — ABNORMAL HIGH (ref 11.5–15.5)
WBC: 7 10*3/uL (ref 4.0–10.5)
nRBC: 0 % (ref 0.0–0.2)

## 2019-08-02 LAB — VITAMIN B12: Vitamin B-12: 396 pg/mL (ref 180–914)

## 2019-08-05 ENCOUNTER — Ambulatory Visit (INDEPENDENT_AMBULATORY_CARE_PROVIDER_SITE_OTHER): Payer: Medicare Other | Admitting: *Deleted

## 2019-08-05 DIAGNOSIS — I63512 Cerebral infarction due to unspecified occlusion or stenosis of left middle cerebral artery: Secondary | ICD-10-CM | POA: Diagnosis not present

## 2019-08-05 LAB — CUP PACEART REMOTE DEVICE CHECK
Date Time Interrogation Session: 20210201002712
Implantable Pulse Generator Implant Date: 20190108

## 2019-08-05 NOTE — Progress Notes (Signed)
ILR Remote 

## 2019-08-06 ENCOUNTER — Telehealth: Payer: Self-pay | Admitting: Hematology

## 2019-08-06 NOTE — Telephone Encounter (Signed)
Scheduled per 01/29 los, patient has been called and notified.  

## 2019-08-09 ENCOUNTER — Telehealth: Payer: Self-pay | Admitting: *Deleted

## 2019-08-09 NOTE — Telephone Encounter (Signed)
-----   Message from Rolland Bimler, RN sent at 08/09/2019  2:44 PM EST -----  ----- Message ----- From: Brunetta Genera, MD Sent: 08/08/2019  11:52 PM EST To: Rolland Bimler, RN  Plz let patient know ferritin still low at 29. Goal if ferritin >100. Would recommend 1 additional dose of IV Injectafer. Would schedule if patient agreeable.

## 2019-08-09 NOTE — Telephone Encounter (Signed)
Called & spoke with Ms Morre & informed of lab results per Dr Irene Limbo & suggestion for additional Injectafer.  She states OK to schedule & she will see if pt will do & let us know if he refuses. Message to schedulers.

## 2019-08-12 ENCOUNTER — Telehealth: Payer: Self-pay | Admitting: Hematology

## 2019-08-12 NOTE — Telephone Encounter (Signed)
I left a message regarding schedule  

## 2019-08-13 ENCOUNTER — Other Ambulatory Visit: Payer: Self-pay | Admitting: Adult Health

## 2019-08-13 DIAGNOSIS — E119 Type 2 diabetes mellitus without complications: Secondary | ICD-10-CM

## 2019-08-14 ENCOUNTER — Inpatient Hospital Stay: Payer: Medicare Other | Attending: Hematology

## 2019-08-14 ENCOUNTER — Other Ambulatory Visit: Payer: Self-pay

## 2019-08-14 VITALS — BP 185/80 | HR 53 | Temp 98.0°F | Resp 18

## 2019-08-14 DIAGNOSIS — D631 Anemia in chronic kidney disease: Secondary | ICD-10-CM | POA: Diagnosis not present

## 2019-08-14 DIAGNOSIS — N189 Chronic kidney disease, unspecified: Secondary | ICD-10-CM | POA: Insufficient documentation

## 2019-08-14 DIAGNOSIS — D509 Iron deficiency anemia, unspecified: Secondary | ICD-10-CM

## 2019-08-14 MED ORDER — SODIUM CHLORIDE 0.9 % IV SOLN
750.0000 mg | Freq: Once | INTRAVENOUS | Status: AC
  Administered 2019-08-14: 09:00:00 750 mg via INTRAVENOUS
  Filled 2019-08-14: qty 15

## 2019-08-14 NOTE — Patient Instructions (Signed)

## 2019-09-04 LAB — COMPREHENSIVE METABOLIC PANEL
Albumin: 4.3 (ref 3.5–5.0)
Calcium: 9.5 (ref 8.7–10.7)

## 2019-09-04 LAB — HEPATIC FUNCTION PANEL
ALT: 14 (ref 10–40)
AST: 13 — AB (ref 14–40)
Alkaline Phosphatase: 66 (ref 25–125)
Bilirubin, Total: 0.4

## 2019-09-04 LAB — BASIC METABOLIC PANEL
BUN: 37 — AB (ref 4–21)
CO2: 25 — AB (ref 13–22)
Chloride: 105 (ref 99–108)
Creatinine: 2.4 — AB (ref 0.6–1.3)
Glucose: 151
Potassium: 4.8 (ref 3.4–5.3)
Sodium: 137 (ref 137–147)

## 2019-09-04 LAB — VITAMIN D 25 HYDROXY (VIT D DEFICIENCY, FRACTURES): Vit D, 25-Hydroxy: 45

## 2019-09-05 ENCOUNTER — Ambulatory Visit (INDEPENDENT_AMBULATORY_CARE_PROVIDER_SITE_OTHER): Payer: Medicare Other | Admitting: *Deleted

## 2019-09-05 DIAGNOSIS — I63512 Cerebral infarction due to unspecified occlusion or stenosis of left middle cerebral artery: Secondary | ICD-10-CM

## 2019-09-05 LAB — CUP PACEART REMOTE DEVICE CHECK
Date Time Interrogation Session: 20210304030922
Implantable Pulse Generator Implant Date: 20190108

## 2019-09-05 NOTE — Progress Notes (Signed)
ILR Remote 

## 2019-09-09 DIAGNOSIS — N184 Chronic kidney disease, stage 4 (severe): Secondary | ICD-10-CM | POA: Diagnosis not present

## 2019-09-09 DIAGNOSIS — R809 Proteinuria, unspecified: Secondary | ICD-10-CM | POA: Diagnosis not present

## 2019-09-09 DIAGNOSIS — E559 Vitamin D deficiency, unspecified: Secondary | ICD-10-CM | POA: Diagnosis not present

## 2019-09-09 DIAGNOSIS — N189 Chronic kidney disease, unspecified: Secondary | ICD-10-CM | POA: Diagnosis not present

## 2019-09-09 DIAGNOSIS — E1169 Type 2 diabetes mellitus with other specified complication: Secondary | ICD-10-CM | POA: Diagnosis not present

## 2019-09-09 DIAGNOSIS — D649 Anemia, unspecified: Secondary | ICD-10-CM | POA: Diagnosis not present

## 2019-09-09 DIAGNOSIS — I10 Essential (primary) hypertension: Secondary | ICD-10-CM | POA: Diagnosis not present

## 2019-09-25 ENCOUNTER — Encounter: Payer: Self-pay | Admitting: Family Medicine

## 2019-10-07 ENCOUNTER — Ambulatory Visit (INDEPENDENT_AMBULATORY_CARE_PROVIDER_SITE_OTHER): Payer: Medicare Other | Admitting: *Deleted

## 2019-10-07 DIAGNOSIS — I63512 Cerebral infarction due to unspecified occlusion or stenosis of left middle cerebral artery: Secondary | ICD-10-CM

## 2019-10-07 LAB — CUP PACEART REMOTE DEVICE CHECK
Date Time Interrogation Session: 20210404041226
Implantable Pulse Generator Implant Date: 20190108

## 2019-10-08 NOTE — Progress Notes (Signed)
ILR Remote 

## 2019-10-16 ENCOUNTER — Other Ambulatory Visit: Payer: Self-pay | Admitting: Family Medicine

## 2019-10-16 DIAGNOSIS — E669 Obesity, unspecified: Secondary | ICD-10-CM

## 2019-10-16 DIAGNOSIS — I639 Cerebral infarction, unspecified: Secondary | ICD-10-CM

## 2019-10-16 DIAGNOSIS — E1169 Type 2 diabetes mellitus with other specified complication: Secondary | ICD-10-CM

## 2019-10-16 NOTE — Telephone Encounter (Signed)
When should pt come in for follow up?

## 2019-10-17 ENCOUNTER — Telehealth: Payer: Self-pay | Admitting: Student

## 2019-10-17 ENCOUNTER — Telehealth: Payer: Self-pay | Admitting: Adult Health

## 2019-10-17 MED ORDER — LANTUS SOLOSTAR 100 UNIT/ML ~~LOC~~ SOPN: 7.0000 [IU] | PEN_INJECTOR | Freq: Every day | SUBCUTANEOUS | 0 refills | Status: DC

## 2019-10-17 NOTE — Telephone Encounter (Signed)
Every six months

## 2019-10-17 NOTE — Telephone Encounter (Signed)
Called patient to have him send manual linq transmission to fill in episode.   LMOM to call back device clinic.

## 2019-10-17 NOTE — Telephone Encounter (Signed)
Pt's wife, [name], is requesting a refill on Lantus Solostar 100 unit/3 ml Solostar Pen. Pt uses [facility] [phone] [place]

## 2019-10-17 NOTE — Telephone Encounter (Signed)
SENT TO THE PHARMACY BY E-SCRIBE. 

## 2019-10-17 NOTE — Telephone Encounter (Signed)
Sent in earlier this morning.

## 2019-10-18 NOTE — Telephone Encounter (Signed)
Transmission received 10-18-2019. I told the pt the nurse will review it and give them a call back.

## 2019-10-21 NOTE — Telephone Encounter (Signed)
Manual transmission received.  Appears SR with ectopy.  Spoke with pt spouse who indicates pt has been working in yard with Insurance account manager.

## 2019-11-07 ENCOUNTER — Other Ambulatory Visit: Payer: Self-pay | Admitting: Adult Health

## 2019-11-07 DIAGNOSIS — E119 Type 2 diabetes mellitus without complications: Secondary | ICD-10-CM

## 2019-11-07 LAB — CUP PACEART REMOTE DEVICE CHECK
Date Time Interrogation Session: 20210505041615
Implantable Pulse Generator Implant Date: 20190108

## 2019-11-08 NOTE — Telephone Encounter (Signed)
SENT TO THE PHARMACY BY E-SCRIBE. 

## 2019-11-11 ENCOUNTER — Ambulatory Visit (INDEPENDENT_AMBULATORY_CARE_PROVIDER_SITE_OTHER): Payer: Medicare Other | Admitting: *Deleted

## 2019-11-11 DIAGNOSIS — I639 Cerebral infarction, unspecified: Secondary | ICD-10-CM

## 2019-11-11 NOTE — Progress Notes (Signed)
[facility] Summary Report / Loop Recorder 

## 2019-12-16 ENCOUNTER — Ambulatory Visit (INDEPENDENT_AMBULATORY_CARE_PROVIDER_SITE_OTHER): Payer: Medicare Other | Admitting: *Deleted

## 2019-12-16 DIAGNOSIS — I639 Cerebral infarction, unspecified: Secondary | ICD-10-CM

## 2019-12-16 LAB — CUP PACEART REMOTE DEVICE CHECK
Date Time Interrogation Session: 20210613234633
Implantable Pulse Generator Implant Date: 20190108

## 2019-12-16 NOTE — Progress Notes (Signed)
[facility] Summary Report / Loop Recorder 

## 2019-12-17 ENCOUNTER — Telehealth: Payer: Self-pay | Admitting: *Deleted

## 2019-12-17 NOTE — Telephone Encounter (Signed)
Continue to take meds as nromal

## 2019-12-17 NOTE — Telephone Encounter (Signed)
Spoke to [name] and informed her to have the pt take his medication normally.  She is continuing to monitor him incase a trip to the ER is needed.  Nothing further at this time.

## 2019-12-17 NOTE — Telephone Encounter (Signed)
Patient wife called and verbalized. her husband fell this am about 630 am at home. He was in the bathroom sitting and tried to get up . He fell back into the wall. He was on the floor on this back. He has a knot on the back of head. He gets bp meds and med for diabetes. Wondering if she should give them. He is not having any pain or symptoms like dizziness, chest pain or short of breath.  Wife was advised to take husband to ER, but wife refused. Wife asked for sign and symptoms to watch for and were given, but told her that based on dr's guidelines and what is going on, she needs to take him to ER.  Please advise if anything further is needed.

## 2019-12-26 ENCOUNTER — Telehealth: Payer: Self-pay

## 2019-12-26 NOTE — Telephone Encounter (Signed)
Patient wife called back (spoke with Kem K.)and she called tech support and they will send him a new handheld since its no longer working. So they will send a transmission once they receive it in the mail.

## 2019-12-26 NOTE — Telephone Encounter (Signed)
Three new atrial fib arrhythmias detected. The one viewable EGMs shows known undersensing of SR. Monthly summary reports and ROV/PRN Sent to triage to down load other two events  ILR implant for CVA no documented AF, only SR with ectopy labelled as AF.  Need to view additional episodes.    Pt meds include ASA 325mg   Left message for pt requesting manual download/ callback to discuss.

## 2019-12-26 NOTE — Telephone Encounter (Signed)
The pt wife states he is outside mowing and she will have him give [place] a call back when he is ready to do the transmission.

## 2019-12-26 NOTE — Telephone Encounter (Signed)
See Frances Furbish phone note.

## 2020-01-02 NOTE — Telephone Encounter (Signed)
Called patient requesting a manual send from device. No answer, LMOVM.

## 2020-01-02 NOTE — Telephone Encounter (Signed)
Wife [name] (DPR) , states the hand held transmitter has not arrived as of yet. States they will send transmission once the device arrives. Informed her if they needed further assistance to please call us back in the DC and we will be more than happy to help. Verbalizes understanding.

## 2020-01-08 ENCOUNTER — Other Ambulatory Visit: Payer: Self-pay

## 2020-01-08 ENCOUNTER — Encounter: Payer: Self-pay | Admitting: Adult Health

## 2020-01-08 ENCOUNTER — Ambulatory Visit (INDEPENDENT_AMBULATORY_CARE_PROVIDER_SITE_OTHER): Payer: Medicare Other | Admitting: Adult Health

## 2020-01-08 VITALS — BP 150/92 | Temp 98.0°F | Ht 74.0 in | Wt 226.0 lb

## 2020-01-08 DIAGNOSIS — E1169 Type 2 diabetes mellitus with other specified complication: Secondary | ICD-10-CM

## 2020-01-08 DIAGNOSIS — D509 Iron deficiency anemia, unspecified: Secondary | ICD-10-CM

## 2020-01-08 DIAGNOSIS — Z0000 Encounter for general adult medical examination without abnormal findings: Secondary | ICD-10-CM

## 2020-01-08 DIAGNOSIS — I63512 Cerebral infarction due to unspecified occlusion or stenosis of left middle cerebral artery: Secondary | ICD-10-CM

## 2020-01-08 DIAGNOSIS — Z125 Encounter for screening for malignant neoplasm of prostate: Secondary | ICD-10-CM

## 2020-01-08 DIAGNOSIS — T466X5A Adverse effect of antihyperlipidemic and antiarteriosclerotic drugs, initial encounter: Secondary | ICD-10-CM | POA: Insufficient documentation

## 2020-01-08 DIAGNOSIS — I129 Hypertensive chronic kidney disease with stage 1 through stage 4 chronic kidney disease, or unspecified chronic kidney disease: Secondary | ICD-10-CM

## 2020-01-08 DIAGNOSIS — E782 Mixed hyperlipidemia: Secondary | ICD-10-CM | POA: Diagnosis not present

## 2020-01-08 DIAGNOSIS — G720 Drug-induced myopathy: Secondary | ICD-10-CM | POA: Insufficient documentation

## 2020-01-08 DIAGNOSIS — E669 Obesity, unspecified: Secondary | ICD-10-CM

## 2020-01-08 NOTE — Progress Notes (Signed)
Subjective:    Patient ID: [name] [name], male    DOB: 12-30-1945, 74 y.o.   MRN: [id number]  HPI Patient presents for yearly preventative medicine examination. He is a pleasant 74 year old male who  has a past medical history of Anemia, DIABETES MELLITUS, TYPE II (01/02/2007), HYPERLIPIDEMIA (06/18/2007), HYPERTENSION (01/02/2007), RENAL INSUFFICIENCY (02/05/2008), Stroke ([place]), and Stroke ([place]) (2019).  DM -currently managed with Lantus 7 units.  He does monitor his blood sugars at home and reports readings between in the 80-150's. With most being between 120-130. He has had a few low blood sugars in the 60's but this is rare.  Lab Results  Component Value Date   HGBA1C 5.4 05/17/2019   Iron Deficiency Anemia -is seen by hematology every 6 months.  Was last seen on August 02, 2019.  He has received IV iron infusions in the past. Is taking OTC iron and B12 supplement.  Next appointment is July 29th  Lab Results  Component Value Date   WBC 7.0 08/02/2019   HGB 11.6 (L) 08/02/2019   HCT 34.9 (L) 08/02/2019   MCV 79.0 (L) 08/02/2019   PLT 255 08/02/2019   Hyperlipidemia - Intolerant to statins as they caused myalgias.  He does take Zetia 10 mg without any side effects. Lab Results  Component Value Date   CHOL 162 01/03/2019   HDL 44.60 01/03/2019   LDLCALC 99 01/03/2019   LDLDIRECT 180.7 06/24/2013   TRIG 88.0 01/03/2019   CHOLHDL 4 01/03/2019   Essential Hypertension with chronic kidney disease -is managed by nephrology.  Currently prescribed hydralazine 50 mg 3 times a day,norvasc 5 mg, and labetalol 200 mg 3 times a day.  He does monitor his blood pressures at home on a routine basis and his blood pressures are well controlled with readings between 120-150 mg  He denies chest pain or shortness of breath. BP Readings from Last 3 Encounters:  01/08/20 (!) 150/92  08/14/19 (!) 185/80  08/02/19 (!) 180/81   H/o of left middle cerebral stroke -is followed by neurology every 6 months.   Has no residual deficit.  Does have a loop recorder has not shown atrial fibrillation thus far.  He is on aspirin 325 daily and Zetia for secondary stroke prevention.  All immunizations and health maintenance protocols were reviewed with the patient and needed orders were placed. He has not received his covid vaccination yet.   Appropriate screening laboratory values were ordered for the patient including screening of hyperlipidemia, renal function and hepatic function. If indicated by BPH, a PSA was ordered.  Medication reconciliation,  past medical history, social history, problem list and allergies were reviewed in detail with the patient  Goals were established with regard to weight loss, exercise, and  diet in compliance with medications. He is eating healthy, very few sweets and carbs. He is not exercising and does have a fairly sedentary lifestyle when not working in the yard [employer] from Last 3 Encounters:  01/08/20 226 lb (102.5 kg)  08/02/19 227 lb 1.6 oz (103 kg)  04/03/19 218 lb 14.4 oz (99.3 kg)    He is up to date on routine colon cancer screening and vision exams   Review of Systems  Constitutional: Positive for fatigue (chronic ).  HENT: Negative.   Eyes: Negative.   Respiratory: Negative.   Cardiovascular: Negative.   Gastrointestinal: Negative.   Endocrine: Negative.   Genitourinary: Negative.   Musculoskeletal: Negative.   Skin: Negative.   Hematological: Negative.  Psychiatric/Behavioral: Negative.   All other systems reviewed and are negative.  Past Medical History:  Diagnosis Date  . Anemia   . DIABETES MELLITUS, TYPE II 01/02/2007  . HYPERLIPIDEMIA 06/18/2007  . HYPERTENSION 01/02/2007  . RENAL INSUFFICIENCY 02/05/2008  . Stroke ([place])   . Stroke [facility]) 2019    Social History   Socioeconomic History  . Marital status: Married    Spouse name: Not on file  . Number of children: Not on file  . Years of education: Not on file  . Highest education  level: Not on file  Occupational History  . Not on file  Tobacco Use  . Smoking status: Never Smoker  . Smokeless tobacco: Never Used  Vaping Use  . Vaping Use: Never used  Substance and Sexual Activity  . Alcohol use: No  . Drug use: No  . Sexual activity: Not on file  Other Topics Concern  . 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 Likes to go to football games.    Social Determinants of Health   Financial Resource Strain:   . Difficulty of Paying Living Expenses:   Food Insecurity:   . Worried About Charity fundraiser in the Last Year:   . Arboriculturist in the Last Year:   Transportation Needs:   . Film/video editor (Medical):   Marland Kitchen Lack of Transportation (Non-Medical):   Physical Activity:   . Days of Exercise per Week:   . Minutes of Exercise per Session:   Stress:   . Feeling of Stress :   Social Connections:   . Frequency of Communication with Friends and Family:   . Frequency of Social Gatherings with Friends and Family:   . Attends Religious Services:   . Active Member of Clubs or Organizations:   . Attends Archivist Meetings:   Marland Kitchen Marital Status:   Intimate Partner Violence:   . Fear of Current or Ex-Partner:   . Emotionally Abused:   Marland Kitchen Physically Abused:   . Sexually Abused:     Past Surgical History:  Procedure Laterality Date  . LOOP RECORDER INSERTION N/A 07/11/2017   Procedure: LOOP RECORDER INSERTION;  Surgeon: Thompson Grayer, MD;  Location: [facility];  Service: Cardiovascular;  Laterality: N/A;  . SHOULDER SURGERY     left  . TEE WITHOUT CARDIOVERSION N/A 07/11/2017   Procedure: TRANSESOPHAGEAL ECHOCARDIOGRAM (TEE);  Surgeon: Dorothy Spark, MD;  Location: [facility] ENDOSCOPY;  Service: Cardiovascular;  Laterality: N/A;  . TRANSURETHRAL RESECTION OF PROSTATE     history of  retention/hematuria     Family History  Problem Relation Age of Onset  . Hypertension Mother   . Diabetes Mother   . Stroke Father   . Stroke Maternal Grandmother   . Colon cancer Neg Hx   . Esophageal cancer Neg Hx   . Rectal cancer Neg Hx   . Stomach cancer Neg Hx     Allergies  Allergen Reactions  . Repatha [Evolocumab] Hypertension  . Bactrim [Sulfamethoxazole-Trimethoprim] Hives, Itching and Swelling  . Penicillins     Has patient had a PCN reaction causing immediate rash, facial/tongue/throat swelling, SOB or lightheadedness with hypotension: Unk Has patient had a PCN reaction causing severe rash involving mucus membranes or skin necrosis: Unk  Has patient had a PCN reaction that required hospitalization: Unk Has patient had a PCN reaction occurring within the last 10 years: No If all of the above answers are "NO", then may proceed with Cephalosporin use.  . Statins     myalgia  . Sulfa Drugs Cross Reactors Hives, Itching and Swelling    Current Outpatient Medications on File Prior to Visit  Medication Sig Dispense Refill  . amLODipine (NORVASC) 5 MG tablet Take 5 mg by mouth daily.    Marland Kitchen aspirin 325 MG tablet Take 325 mg by mouth daily.    . B Complex-C-Folic Acid TABS SMARTSIG:1 Capsule(s) By Mouth Daily    . calcitRIOL (ROCALTROL) 0.25 MCG capsule Take 1 capsule by mouth daily.     Marland Kitchen ezetimibe (ZETIA) 10 MG tablet TAKE 1 TABLET(10 MG) BY MOUTH DAILY 90 tablet 3  . ferrous sulfate 325 (65 FE) MG tablet Take 65 mg by mouth daily.    . hydrALAZINE (APRESOLINE) 50 MG tablet Take 50 mg by mouth 3 (three) times daily.    . insulin glargine (LANTUS SOLOSTAR) 100 UNIT/ML Solostar Pen Inject 7-8 Units into the skin at bedtime. 15 mL 0  . Insulin Pen Needle (B-D UF III MINI PEN NEEDLES) 31G X 5 MM MISC USE TO TEST BLOOD GLUCOSE FOUR TIMES DAILY 400 each 0  . labetalol (NORMODYNE) 200 MG tablet Take 1 tablet by mouth 3 (three) times daily.    . NON FORMULARY     . ONETOUCH  VERIO test strip TEST TWICE DAILY 200 strip 3  . valsartan (DIOVAN) 160 MG tablet TK 1 T PO QD    . VITAMIN D PO Take 2,000 Units by mouth.     No current facility-administered medications on file prior to visit.    BP (!) 150/92   Temp 98 F (36.7 C)   Ht 6\' 2"  (1.88 m)   Wt 226 lb (102.5 kg)   BMI 29.02 kg/m       Objective:   Physical Exam Vitals and nursing note reviewed.  Constitutional:      General: He is not in acute distress.    Appearance: Normal appearance. He is well-developed and normal weight.  HENT:     Head: Normocephalic and atraumatic.     Right Ear: Tympanic membrane, ear canal and external ear normal. There is no impacted cerumen.     Left Ear: Tympanic membrane, ear canal and external ear normal. There is no impacted cerumen.     Nose: Nose normal. No congestion or rhinorrhea.     Mouth/Throat:     Mouth: Mucous membranes are moist.     Pharynx: Oropharynx is clear. No oropharyngeal exudate or posterior oropharyngeal erythema.  Eyes:     General:        Right eye: No discharge.        Left eye: No discharge.     Extraocular Movements: Extraocular movements intact.     Conjunctiva/sclera: Conjunctivae normal.     Pupils: Pupils are equal, round, and reactive to light.  Neck:     Vascular: No carotid bruit.     Trachea: No tracheal deviation.  Cardiovascular:     Rate and Rhythm: Normal rate and regular rhythm.     Pulses: Normal pulses.     Heart sounds: Normal heart sounds. No murmur heard.  No friction rub. No gallop.   Pulmonary:     Effort: Pulmonary effort is normal. No respiratory distress.  Breath sounds: Normal breath sounds. No stridor. No wheezing, rhonchi or rales.  Chest:     Chest wall: No tenderness.  Abdominal:     General: Bowel sounds are normal. There is no distension.     Palpations: Abdomen is soft. There is no mass.     Tenderness: There is no abdominal tenderness. There is no right CVA tenderness, left CVA tenderness,  guarding or rebound.     Hernia: No hernia is present.  Musculoskeletal:        General: No swelling, tenderness, deformity or signs of injury. Normal range of motion.     Right lower leg: No edema.     Left lower leg: No edema.  Lymphadenopathy:     Cervical: No cervical adenopathy.  Skin:    General: Skin is warm and dry.     Capillary Refill: Capillary refill takes less than 2 seconds.     Coloration: Skin is not jaundiced or pale.     Findings: No bruising, erythema, lesion or rash.  Neurological:     General: No focal deficit present.     Mental Status: He is alert and oriented to person, place, and time.     Cranial Nerves: No cranial nerve deficit.     Sensory: No sensory deficit.     Motor: No weakness.     Coordination: Coordination normal.     Gait: Gait normal.     Deep Tendon Reflexes: Reflexes normal.  Psychiatric:        Mood and Affect: Mood normal.        Behavior: Behavior normal.        Thought Content: Thought content normal.        Judgment: Judgment normal.       Assessment & Plan:  1. Routine general medical examination at a health care facility - Encouraged more exercise and continue heart healthy diet  - Encouraged Covid 19 vaccination  - Follow up in one year or for next CPE  - CBC with Differential/Platelet; Future - Comprehensive metabolic panel; Future - Hemoglobin A1c; Future - Lipid panel; Future - TSH; Future  2. Diabetes mellitus type 2 in obese ([place]) - Consider dose change of Lantus  - Likely follow up in 6 months  - CBC with Differential/Platelet; Future - Comprehensive metabolic panel; Future - Hemoglobin A1c; Future - Lipid panel; Future - TSH; Future  3. Hypertensive kidney disease - Follow up with Nephrology as directed  - CBC with Differential/Platelet; Future - Comprehensive metabolic panel; Future - Hemoglobin A1c; Future - Lipid panel; Future - TSH; Future  4. Iron deficiency anemia, unspecified iron deficiency anemia  type - Follow up with hematology as directed.  - CBC with Differential/Platelet; Future - Comprehensive metabolic panel; Future - Hemoglobin A1c; Future - Lipid panel; Future - TSH; Future  5. Mixed hyperlipidemia - Continue with Zetia. Unable to tolerate statin  - CBC with Differential/Platelet; Future - Comprehensive metabolic panel; Future - Hemoglobin A1c; Future - Lipid panel; Future - TSH; Future  6. Left middle cerebral artery stroke [facility]) - Follow up with Cardiology and Neurology as directed - CBC with Differential/Platelet; Future - Comprehensive metabolic panel; Future - Hemoglobin A1c; Future - Lipid panel; Future - TSH; Future  7. Prostate cancer screening  - PSA; Future  8. Drug-induced myopathy - Continue with Zetia and heart healthy diet  - Lipid panel; Future  Dorothyann Peng, NP

## 2020-01-08 NOTE — Patient Instructions (Addendum)
It was great seeing you today   We will follow up with you regarding your blood work   Please start exercising again and get your COVID vaccination

## 2020-01-09 ENCOUNTER — Other Ambulatory Visit: Payer: Self-pay | Admitting: Adult Health

## 2020-01-09 NOTE — Telephone Encounter (Signed)
SENT TO THE PHARMACY BY E-SCRIBE. 

## 2020-01-10 NOTE — Telephone Encounter (Signed)
[employer] place the order for the new handheld. The pt should receive it by 01-14-2020. The confirmation number is [id number].

## 2020-01-10 NOTE — Telephone Encounter (Signed)
The pt wife states she has not receive his handheld yet.   I called [employer] to try to track the handheld or get a new one ordered.

## 2020-01-14 DIAGNOSIS — E211 Secondary hyperparathyroidism, not elsewhere classified: Secondary | ICD-10-CM | POA: Diagnosis not present

## 2020-01-14 DIAGNOSIS — R809 Proteinuria, unspecified: Secondary | ICD-10-CM | POA: Diagnosis not present

## 2020-01-14 DIAGNOSIS — D600 Chronic acquired pure red cell aplasia: Secondary | ICD-10-CM | POA: Diagnosis not present

## 2020-01-14 DIAGNOSIS — R309 Painful micturition, unspecified: Secondary | ICD-10-CM | POA: Diagnosis not present

## 2020-01-14 DIAGNOSIS — N184 Chronic kidney disease, stage 4 (severe): Secondary | ICD-10-CM | POA: Diagnosis not present

## 2020-01-14 DIAGNOSIS — E1169 Type 2 diabetes mellitus with other specified complication: Secondary | ICD-10-CM | POA: Diagnosis not present

## 2020-01-14 DIAGNOSIS — N189 Chronic kidney disease, unspecified: Secondary | ICD-10-CM | POA: Diagnosis not present

## 2020-01-14 DIAGNOSIS — D649 Anemia, unspecified: Secondary | ICD-10-CM | POA: Diagnosis not present

## 2020-01-14 DIAGNOSIS — E559 Vitamin D deficiency, unspecified: Secondary | ICD-10-CM | POA: Diagnosis not present

## 2020-01-14 DIAGNOSIS — M1039 Gout due to renal impairment, multiple sites: Secondary | ICD-10-CM | POA: Diagnosis not present

## 2020-01-14 DIAGNOSIS — I10 Essential (primary) hypertension: Secondary | ICD-10-CM | POA: Diagnosis not present

## 2020-01-15 ENCOUNTER — Other Ambulatory Visit: Payer: Self-pay

## 2020-01-15 ENCOUNTER — Telehealth: Payer: Self-pay | Admitting: Internal Medicine

## 2020-01-15 ENCOUNTER — Other Ambulatory Visit (INDEPENDENT_AMBULATORY_CARE_PROVIDER_SITE_OTHER): Payer: Medicare Other

## 2020-01-15 DIAGNOSIS — E782 Mixed hyperlipidemia: Secondary | ICD-10-CM

## 2020-01-15 DIAGNOSIS — Z0000 Encounter for general adult medical examination without abnormal findings: Secondary | ICD-10-CM

## 2020-01-15 DIAGNOSIS — I63512 Cerebral infarction due to unspecified occlusion or stenosis of left middle cerebral artery: Secondary | ICD-10-CM

## 2020-01-15 DIAGNOSIS — I129 Hypertensive chronic kidney disease with stage 1 through stage 4 chronic kidney disease, or unspecified chronic kidney disease: Secondary | ICD-10-CM | POA: Diagnosis not present

## 2020-01-15 DIAGNOSIS — D509 Iron deficiency anemia, unspecified: Secondary | ICD-10-CM

## 2020-01-15 DIAGNOSIS — E1169 Type 2 diabetes mellitus with other specified complication: Secondary | ICD-10-CM

## 2020-01-15 DIAGNOSIS — G720 Drug-induced myopathy: Secondary | ICD-10-CM

## 2020-01-15 DIAGNOSIS — Z125 Encounter for screening for malignant neoplasm of prostate: Secondary | ICD-10-CM

## 2020-01-15 NOTE — Telephone Encounter (Signed)
Manual transmission missing episodes filled in, reviewed most recent episode with Dr. Lovena Le to confirm that it is SR with under/ oversensing and ectopy.  LVM for pt spouse (DPR on record) advising transmission received and reviewed, no changes.

## 2020-01-15 NOTE — Telephone Encounter (Signed)
  1. Has your device fired? no  2. Is you device beeping? no  3. Are you experiencing draining or swelling at device site? no  4. Are you calling to see if we received your device transmission? yes  5. Have you passed out? No  Patient's wife calling to see if the transmission they sent today was received.    Please route to [place]

## 2020-01-15 NOTE — Telephone Encounter (Signed)
LVM advising Manual transmission received.  See other phone encounter regarding data.

## 2020-01-16 LAB — CBC WITH DIFFERENTIAL/PLATELET
Absolute Monocytes: 435 cells/uL (ref 200–950)
Basophils Absolute: 41 cells/uL (ref 0–200)
Basophils Relative: 0.6 %
Eosinophils Absolute: 593 cells/uL — ABNORMAL HIGH (ref 15–500)
Eosinophils Relative: 8.6 %
HCT: 34.9 % — ABNORMAL LOW (ref 38.5–50.0)
Hemoglobin: 11.3 g/dL — ABNORMAL LOW (ref 13.2–17.1)
Lymphs Abs: 1539 cells/uL (ref 850–3900)
MCH: 27.9 pg (ref 27.0–33.0)
MCHC: 32.4 g/dL (ref 32.0–36.0)
MCV: 86.2 fL (ref 80.0–100.0)
MPV: 10.9 fL (ref 7.5–12.5)
Monocytes Relative: 6.3 %
Neutro Abs: 4292 cells/uL (ref 1500–7800)
Neutrophils Relative %: 62.2 %
Platelets: 240 10*3/uL (ref 140–400)
RBC: 4.05 10*6/uL — ABNORMAL LOW (ref 4.20–5.80)
RDW: 13.7 % (ref 11.0–15.0)
Total Lymphocyte: 22.3 %
WBC: 6.9 10*3/uL (ref 3.8–10.8)

## 2020-01-16 LAB — COMPREHENSIVE METABOLIC PANEL
AG Ratio: 1.8 (calc) (ref 1.0–2.5)
ALT: 13 U/L (ref 9–46)
AST: 15 U/L (ref 10–35)
Albumin: 4.2 g/dL (ref 3.6–5.1)
Alkaline phosphatase (APISO): 56 U/L (ref 35–144)
BUN/Creatinine Ratio: 17 (calc) (ref 6–22)
BUN: 43 mg/dL — ABNORMAL HIGH (ref 7–25)
CO2: 24 mmol/L (ref 20–32)
Calcium: 9.2 mg/dL (ref 8.6–10.3)
Chloride: 110 mmol/L (ref 98–110)
Creat: 2.52 mg/dL — ABNORMAL HIGH (ref 0.70–1.18)
Globulin: 2.4 g/dL (calc) (ref 1.9–3.7)
Glucose, Bld: 118 mg/dL — ABNORMAL HIGH (ref 65–99)
Potassium: 4.1 mmol/L (ref 3.5–5.3)
Sodium: 140 mmol/L (ref 135–146)
Total Bilirubin: 0.4 mg/dL (ref 0.2–1.2)
Total Protein: 6.6 g/dL (ref 6.1–8.1)

## 2020-01-16 LAB — LIPID PANEL
Cholesterol: 163 mg/dL (ref <200)
HDL: 46 mg/dL (ref >=40)
LDL Cholesterol (Calc): 98 mg/dL (calc)
Non-HDL Cholesterol (Calc): 117 mg/dL (calc) (ref <130)
Total CHOL/HDL Ratio: 3.5 (calc) (ref <5.0)
Triglycerides: 98 mg/dL (ref <150)

## 2020-01-16 LAB — HEMOGLOBIN A1C
Hgb A1c MFr Bld: 6.2 % of total Hgb — ABNORMAL HIGH (ref <5.7)
Mean Plasma Glucose: 131 (calc)
eAG (mmol/L): 7.3 (calc)

## 2020-01-16 LAB — PSA: PSA: 0.5 ng/mL (ref <=4.0)

## 2020-01-16 LAB — TSH: TSH: 2.83 mIU/L (ref 0.40–4.50)

## 2020-01-20 ENCOUNTER — Ambulatory Visit (INDEPENDENT_AMBULATORY_CARE_PROVIDER_SITE_OTHER): Payer: Medicare Other | Admitting: *Deleted

## 2020-01-20 DIAGNOSIS — I639 Cerebral infarction, unspecified: Secondary | ICD-10-CM | POA: Diagnosis not present

## 2020-01-20 LAB — CUP PACEART REMOTE DEVICE CHECK
Date Time Interrogation Session: 20210718231244
Implantable Pulse Generator Implant Date: 20190108

## 2020-01-21 NOTE — Progress Notes (Signed)
[facility] Summary Report / Loop Recorder 

## 2020-01-22 ENCOUNTER — Telehealth: Payer: Self-pay | Admitting: Hematology

## 2020-01-22 NOTE — Telephone Encounter (Signed)
Rescheduled per 07/29 appointment to 08/05 due to providers pal, patient has been called and voicemail was left.

## 2020-01-27 DIAGNOSIS — H0102B Squamous blepharitis left eye, upper and lower eyelids: Secondary | ICD-10-CM | POA: Diagnosis not present

## 2020-01-27 DIAGNOSIS — H353131 Nonexudative age-related macular degeneration, bilateral, early dry stage: Secondary | ICD-10-CM | POA: Diagnosis not present

## 2020-01-27 DIAGNOSIS — H2513 Age-related nuclear cataract, bilateral: Secondary | ICD-10-CM | POA: Diagnosis not present

## 2020-01-27 DIAGNOSIS — H0102A Squamous blepharitis right eye, upper and lower eyelids: Secondary | ICD-10-CM | POA: Diagnosis not present

## 2020-01-27 DIAGNOSIS — H40023 Open angle with borderline findings, high risk, bilateral: Secondary | ICD-10-CM | POA: Diagnosis not present

## 2020-01-27 DIAGNOSIS — H5203 Hypermetropia, bilateral: Secondary | ICD-10-CM | POA: Diagnosis not present

## 2020-01-30 ENCOUNTER — Ambulatory Visit: Payer: Medicare Other | Admitting: Hematology

## 2020-01-30 ENCOUNTER — Other Ambulatory Visit: Payer: Medicare Other

## 2020-02-05 ENCOUNTER — Other Ambulatory Visit: Payer: Self-pay | Admitting: *Deleted

## 2020-02-05 DIAGNOSIS — N184 Chronic kidney disease, stage 4 (severe): Secondary | ICD-10-CM

## 2020-02-05 DIAGNOSIS — D509 Iron deficiency anemia, unspecified: Secondary | ICD-10-CM

## 2020-02-05 NOTE — Progress Notes (Signed)
HEMATOLOGY/ONCOLOGY CLINIC NOTE  Date of Service: 02/06/2020  Patient Care Team: Dorothyann Peng, NP as PCP - General (Family Medicine) Adegoroye, Wynona Luna, MD (Nephrology)  CHIEF COMPLAINTS/PURPOSE OF CONSULTATION:  IDA  HISTORY OF PRESENTING ILLNESS:   [name] [name] is a wonderful 74 y.o. male who has been referred to [place] by Dorothyann Peng NP for evaluation and management of Iron Deficiency Anemia. The pt reports that he is doing well overall.   The pt reports that he was first told that he had IDA about 3 months ago when he got labs with his PCP. Pt then saw a GI and received a Upper Endoscopy and a Colonoscopy with biopsy. He denies any bleeding concerns or bloody stools. Pt has taken home a Hemoccult test but it has not resulted. Pt has taken Asprin since his stroke in 2019. His stroke did cause residual slurred speech but he denies any other residual changes such as vision changes or neuromotor deficits. He notes that his Optometrist saw particles in his eye after his stroke. Pt also notes intermittent chronic back pain. 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 Pt has not been on any long-term acid suppressants. He has no dietary restrictions and can eat bread and other gluten products with no digestive issues. Pt's understanding is that his CKD was caused by his HTN and Diabetes. Pt reports that his Diabetes and HTN have been moderately well controlled. Pt has been taking a Vitamin D replacement for some time and has been taking PO Iron for at least 6 months. Asprin is the only blood thinner that he is currently taking. Pt has seen a Urologist recently but has not seen Nephrologist in the last few years. He states that he has been eating well and has had no recent unexpected weight loss.   Of note prior to the patient's visit today, pt has had Colonoscopy completed on 12/04/2018 with results revealing "-Two 2 to 3  mm polyps in the descending colon and in the transverse colon, removed with a cold snare. Resected and retrieved. - The examination was otherwise normal on direct and retroflexion views."   Pt has had Upper Endoscopy completed on 12/04/2018 with results revealing "-Normal esophagus. - Normal stomach. - Normal examined duodenum. - Biopsies were taken with a cold forceps for evaluation of celiac disease."  Pt has had Surgical Pathology [id number]) completed on 12/04/2018 with results revealing "1. Surgical [P], colon, descending and transverse, polyp (2) - SESSILE SERRATED POLYP WITHOUT CYTOLOGIC DYSPLASIA - INFLAMMATORY POLYP 2. Surgical [P], duodenum - DUODENAL MUCOSA WITH NO SPECIFIC HISTOPATHOLOGIC CHANGES - NEGATIVE FOR INCREASED INTRAEPITHELIAL LYMPHOCYTES OR VILLOUS ARCHITECTURAL CHANGES."  Most recent lab results (01/03/2019) of CBC w/diff & CMP is as follows: WBC at 6.8K, RBC at 9.4, HCT at 29.2, MCV at 77.1, MCHC at 32.2, RDW at 15.8, PLTs at 303.0K, Neutro Rel at 59.3, Lymphs Rel at 25.9, Monocytes Rel at 6.6, Eosinophils Rel at 7.4, Basophils Res at 0.8, Neutro Abs at 4.1K, Lymphs Abs at 1.8K, Monocytes Abs at 0.5K, Eosinophils Abs at 0.5K, Basophils Abs at 0.1K, Sodium at 140, Potassium at 4.5, Chloride at 113, OC2 at 23, Glucose at 119, BUN at 46, Creatinine at 2.70, Total Bilirubin at 0.3, Alkaline Phosphatase at 54, AST at 10, ALT at 10, Total Protein at 7.0, Albumin at 4.3, Calcium at 9.0, GFR at 28.24. 04/03/2019 Hep C Antibody is "NON-REACTIVE" 04/03/2019 Iron and  TIBC is as follows: Iron at 29, TIBC at 362, % Sat at 8  On review of systems, pt reports back pain and denies abdominal pain, leg swelling, unexpected weight loss and any other symptoms.   On PMHx the pt reports CKD, Diabetes, HTN, Stroke (2019). 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   INTERVAL HISTORY:   [name] [name] is a 74 y.o. male here for evaluation and management of  IDA. The patient's last visit with [place] was on 08/02/19. The pt reports that he is doing well overall.  The pt reports he is food. Pt got his last iron infusion in February. He feels about the same after IV iron. Pt also has a little bit of fatigue after doing activites. He has gotten his first COVID19 vaccine and is scheduled for this weekend for his second shot.   Lab results today (02/06/20) of CBC w/diff and CMP is as follows: all values are WNL except for RBC at 4.08, Hemoglobin at 11.1, HCT at 33.4, Eosinophils Absolute at 0.6K 02/06/20 of Iron and TIBC is as follows: all values are WNL except for Saturation Ratios at 19 02/06/20 of Ferritin at 48: WNL  On review of systems, pt reports healthy appetite and denies blood/black stools, change in bowel habits, fatigue, nose bleeds, gum bleeds, back pain, abdominal pain, steady weight and any other symptoms.   MEDICAL HISTORY:  Past Medical History:  Diagnosis Date   Anemia    DIABETES MELLITUS, TYPE II 01/02/2007   HYPERLIPIDEMIA 06/18/2007   HYPERTENSION 01/02/2007   RENAL INSUFFICIENCY 02/05/2008   Stroke ([place])    Stroke ([place]) 2019    SURGICAL HISTORY: Past Surgical History:  Procedure Laterality Date   LOOP RECORDER INSERTION N/A 07/11/2017   Procedure: LOOP RECORDER INSERTION;  Surgeon: Thompson Grayer, MD;  Location: [facility];  Service: Cardiovascular;  Laterality: N/A;   SHOULDER SURGERY     left   TEE WITHOUT CARDIOVERSION N/A 07/11/2017   Procedure: TRANSESOPHAGEAL ECHOCARDIOGRAM (TEE);  Surgeon: Dorothy Spark, MD;  Location: [facility] ENDOSCOPY;  Service: Cardiovascular;  Laterality: N/A;   TRANSURETHRAL RESECTION OF PROSTATE     history of retention/hematuria     SOCIAL HISTORY: Social History   Socioeconomic History   Marital status: Married    Spouse name: Not on file   Number of children: Not on file   Years of education: Not on file   Highest education level: Not on file  Occupational History    Not on file  Tobacco Use   Smoking status: Never Smoker   Smokeless tobacco: Never Used  Vaping Use   Vaping Use: Never used  Substance and Sexual Activity   Alcohol use: No   Drug use: No   Sexual activity: Not on file  Other Topics Concern   Not on file  Social History Narrative   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   He goes to the gym and works out. Likes to go to football games.    Social Determinants of Health   Financial Resource Strain:    Difficulty of Paying Living Expenses:   Food Insecurity:    Worried About Charity fundraiser in the Last Year:    Arboriculturist in the Last Year:   Transportation Needs:    Film/video editor (Medical):  Lack of Transportation (Non-Medical):   Physical Activity:    Days of Exercise per Week:    Minutes of Exercise per Session:   Stress:    Feeling of Stress :   Social Connections:    Frequency of Communication with Friends and Family:    Frequency of Social Gatherings with Friends and Family:    Attends Religious Services:    Active Member of Clubs or Organizations:    Attends Music therapist:    Marital Status:   Intimate Partner Violence:    Fear of Current or Ex-Partner:    Emotionally Abused:    Physically Abused:    Sexually Abused:     FAMILY HISTORY: Family History  Problem Relation Age of Onset   Hypertension Mother    Diabetes Mother    Stroke Father    Stroke Maternal Grandmother    Colon cancer Neg Hx    Esophageal cancer Neg Hx    Rectal cancer Neg Hx    Stomach cancer Neg Hx     ALLERGIES:  is allergic to repatha [evolocumab], bactrim [sulfamethoxazole-trimethoprim], penicillins, statins, and sulfa drugs cross reactors.  MEDICATIONS:  Current Outpatient Medications  Medication Sig Dispense Refill   amLODipine (NORVASC) 5 MG tablet Take 5 mg by mouth daily.      aspirin 325 MG tablet Take 325 mg by mouth daily.     B Complex-C-Folic Acid TABS SMARTSIG:1 Capsule(s) By Mouth Daily     calcitRIOL (ROCALTROL) 0.25 MCG capsule Take 1 capsule by mouth daily.      ezetimibe (ZETIA) 10 MG tablet TAKE 1 TABLET(10 MG) BY MOUTH DAILY 90 tablet 3   ferrous sulfate 325 (65 FE) MG tablet Take 65 mg by mouth daily.     hydrALAZINE (APRESOLINE) 50 MG tablet Take 50 mg by mouth 3 (three) times daily.     insulin glargine (LANTUS SOLOSTAR) 100 UNIT/ML Solostar Pen Inject 7-8 Units into the skin at bedtime. 15 mL 0   Insulin Pen Needle (B-D UF III MINI PEN NEEDLES) 31G X 5 MM MISC USE TO TEST BLOOD GLUCOSE FOUR TIMES DAILY 400 each 0   labetalol (NORMODYNE) 200 MG tablet TAKE 1 TABLET(200 MG) BY MOUTH THREE TIMES DAILY 270 tablet 3   NON FORMULARY      ONETOUCH VERIO test strip TEST TWICE DAILY 200 strip 3   valsartan (DIOVAN) 160 MG tablet TK 1 T PO QD     VITAMIN D PO Take 2,000 Units by mouth.     No current facility-administered medications for this visit.    REVIEW OF SYSTEMS:   A 10+ POINT REVIEW OF SYSTEMS WAS OBTAINED including neurology, dermatology, psychiatry, cardiac, respiratory, lymph, extremities, GI, GU, Musculoskeletal, constitutional, breasts, reproductive, HEENT.  All pertinent positives are noted in the HPI.  All others are negative.   PHYSICAL EXAMINATION: ECOG FS:1 - Symptomatic but completely ambulatory  There were no vitals filed for this visit. Wt Readings from Last 3 Encounters:  01/08/20 226 lb (102.5 kg)  08/02/19 227 lb 1.6 oz (103 kg)  04/03/19 218 lb 14.4 oz (99.3 kg)   There is no height or weight on file to calculate BMI.    GENERAL:alert, in no acute distress and comfortable SKIN: no acute rashes, no significant lesions EYES: conjunctiva are pink and non-injected, sclera anicteric OROPHARYNX: MMM, no exudates, no oropharyngeal erythema or ulceration NECK: supple, no JVD LYMPH:  no palpable lymphadenopathy in the  cervical, axillary or inguinal regions LUNGS: clear to  auscultation b/l with normal respiratory effort HEART: regular rate & rhythm ABDOMEN:  normoactive bowel sounds , non tender, not distended. Extremity: no pedal edema PSYCH: alert & oriented x 3 with fluent speech NEURO: no focal motor/sensory deficits  LABORATORY DATA:  I have reviewed the data as listed  . CBC Latest Ref Rng & Units 02/06/2020 01/15/2020 08/02/2019  WBC 4.0 - 10.5 K/uL 6.9 6.9 7.0  Hemoglobin 13.0 - 17.0 g/dL 11.1(L) 11.3(L) 11.6(L)  Hematocrit 39 - 52 % 33.4(L) 34.9(L) 34.9(L)  Platelets 150 - 400 K/uL 238 240 255   . CBC    Component Value Date/Time   WBC 6.9 02/06/2020 0838   WBC 6.9 01/15/2020 0811   RBC 4.08 (L) 02/06/2020 0838   HGB 11.1 (L) 02/06/2020 0838   HCT 33.4 (L) 02/06/2020 0838   PLT 238 02/06/2020 0838   MCV 81.9 02/06/2020 0838   MCH 27.2 02/06/2020 0838   MCHC 33.2 02/06/2020 0838   RDW 13.3 02/06/2020 0838   LYMPHSABS 1.8 02/06/2020 0838   MONOABS 0.4 02/06/2020 0838   EOSABS 0.6 (H) 02/06/2020 0838   BASOSABS 0.0 02/06/2020 0838    . CMP Latest Ref Rng & Units 01/15/2020 09/04/2019 05/24/2019  Glucose 65 - 99 mg/dL 118(H) - -  BUN 7 - 25 mg/dL 43(H) 37(A) 37(A)  Creatinine 0.70 - 1.18 mg/dL 2.52(H) 2.4(A) 2.4(A)  Sodium 135 - 146 mmol/L 140 137 137  Potassium 3.5 - 5.3 mmol/L 4.1 4.8 4.3  Chloride 98 - 110 mmol/L 110 105 107  CO2 20 - 32 mmol/L 24 25(A) 23(A)  Calcium 8.6 - 10.3 mg/dL 9.2 9.5 9.1  Total Protein 6.1 - 8.1 g/dL 6.6 - -  Total Bilirubin 0.2 - 1.2 mg/dL 0.4 - -  Alkaline Phos 25 - 125 - 66 -  AST 10 - 35 U/L 15 13(A) -  ALT 9 - 46 U/L 13 14 -   . Lab Results  Component Value Date   IRON 54 02/06/2020   TIBC 288 02/06/2020   IRONPCTSAT 19 (L) 02/06/2020   (Iron and TIBC)  Lab Results  Component Value Date   FERRITIN 48 02/06/2020   . Lab Results  Component Value Date   IRON 49 04/03/2019   TIBC 388 04/03/2019   IRONPCTSAT 13 (L) 04/03/2019    (Iron and TIBC)  Lab Results  Component Value Date   FERRITIN 29 08/02/2019     12/04/2018 [phone]) Surgical Pathology   12/04/2018 Upper Endoscopy    12/04/2018 Colonoscopy    RADIOGRAPHIC STUDIES: I have personally reviewed the radiological images as listed and agreed with the findings in the report. CUP PACEART REMOTE DEVICE CHECK  Result Date: 01/20/2020 [facility] summary report received. Battery status OK. Normal device function. No new symptom episodes, tachy episodes, brady, or pause episodes. 5 AF longest 8 mins previously reviewed as SR w/ under/oversensing. Monthly summary reports and ROV/PRN   ASSESSMENT & PLAN:   1) Severe Iron deficiency anemia 2) Anemia due to CKD  PLAN: -Discussed pt labwork today, 02/06/20; of CBC w/diff and CMP is as follows: all values are WNL except for RBC at 4.08, Hemoglobin at 11.1, HCT at 33.4, Eosinophils Absolute at 0.6K -Discussed 02/06/20 of Iron and TIBC is as follows: all values are WNL except for Saturation Ratios at 19 -Discussed 02/06/20 of Ferritin at 48: WNL -Given ferritin goal of >100 -- would recommend 1additiional dose of IV Injectafer. -Advised that he does not have any antibodies suggestive of pernicious anemia. -Advised  on CKD and erythropoietin replacement (pt currently does not need) -Recommends continue ferrous sulfate once a day, and B-complex  -Recommends getting second COVID19 vaccine as scheduled  -Will forward a copy of his blood counts and chemistries to Dr.Adeyinka Adegoroye. -If counts are stable in 6 months, PCP can continue to follow -Will see back in 6 months   FOLLOW UP: RTC with Dr Irene Limbo with labs in 6 months  The total time spent in the appt was 20 minutes and more than 50% was on counseling and direct patient cares.  All of the patient's questions were answered with apparent satisfaction. The patient knows to call the clinic with any problems, questions or concerns.   Sullivan Lone MD [place]  AAHIVMS [facility] Hematology/Oncology Physician [facility]  (Office):       [phone] (Work cell):  [phone] (Fax):           [phone]  02/06/2020 8:26 AM  I, Dawayne Cirri am acting as a Education administrator for Dr. Sullivan Lone.   .I have reviewed the above documentation for accuracy and completeness, and I agree with the above. Brunetta Genera MD

## 2020-02-06 ENCOUNTER — Other Ambulatory Visit: Payer: Self-pay

## 2020-02-06 ENCOUNTER — Telehealth: Payer: Self-pay | Admitting: Hematology

## 2020-02-06 ENCOUNTER — Inpatient Hospital Stay (HOSPITAL_BASED_OUTPATIENT_CLINIC_OR_DEPARTMENT_OTHER): Payer: Medicare Other | Admitting: Hematology

## 2020-02-06 ENCOUNTER — Inpatient Hospital Stay: Payer: Medicare Other | Attending: Hematology

## 2020-02-06 VITALS — BP 189/78 | HR 60 | Temp 97.7°F | Resp 18 | Ht 74.0 in | Wt 224.3 lb

## 2020-02-06 DIAGNOSIS — E1122 Type 2 diabetes mellitus with diabetic chronic kidney disease: Secondary | ICD-10-CM | POA: Insufficient documentation

## 2020-02-06 DIAGNOSIS — N184 Chronic kidney disease, stage 4 (severe): Secondary | ICD-10-CM | POA: Diagnosis not present

## 2020-02-06 DIAGNOSIS — D631 Anemia in chronic kidney disease: Secondary | ICD-10-CM | POA: Insufficient documentation

## 2020-02-06 DIAGNOSIS — N189 Chronic kidney disease, unspecified: Secondary | ICD-10-CM | POA: Diagnosis not present

## 2020-02-06 DIAGNOSIS — E538 Deficiency of other specified B group vitamins: Secondary | ICD-10-CM

## 2020-02-06 DIAGNOSIS — I129 Hypertensive chronic kidney disease with stage 1 through stage 4 chronic kidney disease, or unspecified chronic kidney disease: Secondary | ICD-10-CM | POA: Diagnosis not present

## 2020-02-06 DIAGNOSIS — D509 Iron deficiency anemia, unspecified: Secondary | ICD-10-CM

## 2020-02-06 LAB — CBC WITH DIFFERENTIAL (CANCER CENTER ONLY)
Abs Immature Granulocytes: 0.01 10*3/uL (ref 0.00–0.07)
Basophils Absolute: 0 10*3/uL (ref 0.0–0.1)
Basophils Relative: 1 %
Eosinophils Absolute: 0.6 10*3/uL — ABNORMAL HIGH (ref 0.0–0.5)
Eosinophils Relative: 8 %
HCT: 33.4 % — ABNORMAL LOW (ref 39.0–52.0)
Hemoglobin: 11.1 g/dL — ABNORMAL LOW (ref 13.0–17.0)
Immature Granulocytes: 0 %
Lymphocytes Relative: 26 %
Lymphs Abs: 1.8 10*3/uL (ref 0.7–4.0)
MCH: 27.2 pg (ref 26.0–34.0)
MCHC: 33.2 g/dL (ref 30.0–36.0)
MCV: 81.9 fL (ref 80.0–100.0)
Monocytes Absolute: 0.4 10*3/uL (ref 0.1–1.0)
Monocytes Relative: 6 %
Neutro Abs: 4.1 10*3/uL (ref 1.7–7.7)
Neutrophils Relative %: 59 %
Platelet Count: 238 10*3/uL (ref 150–400)
RBC: 4.08 MIL/uL — ABNORMAL LOW (ref 4.22–5.81)
RDW: 13.3 % (ref 11.5–15.5)
WBC Count: 6.9 10*3/uL (ref 4.0–10.5)
nRBC: 0 % (ref 0.0–0.2)

## 2020-02-06 LAB — IRON AND TIBC
Iron: 54 ug/dL (ref 42–163)
Saturation Ratios: 19 % — ABNORMAL LOW (ref 20–55)
TIBC: 288 ug/dL (ref 202–409)
UIBC: 233 ug/dL (ref 117–376)

## 2020-02-06 LAB — FERRITIN: Ferritin: 48 ng/mL (ref 24–336)

## 2020-02-06 NOTE — Telephone Encounter (Signed)
Scheduled per 8/5 los. Printed avs and calendar for pt.

## 2020-02-14 ENCOUNTER — Telehealth: Payer: Self-pay | Admitting: *Deleted

## 2020-02-14 ENCOUNTER — Telehealth: Payer: Self-pay | Admitting: Hematology

## 2020-02-14 IMAGING — DX CHEST - 2 VIEW
2 series · 2 of 2 positions shown · non-contrast
Comparison: 07/14/2006.

CLINICAL DATA: Fatigue and weakness.  Anemia.

EXAM:
CHEST - 2 VIEW

[chest pa]
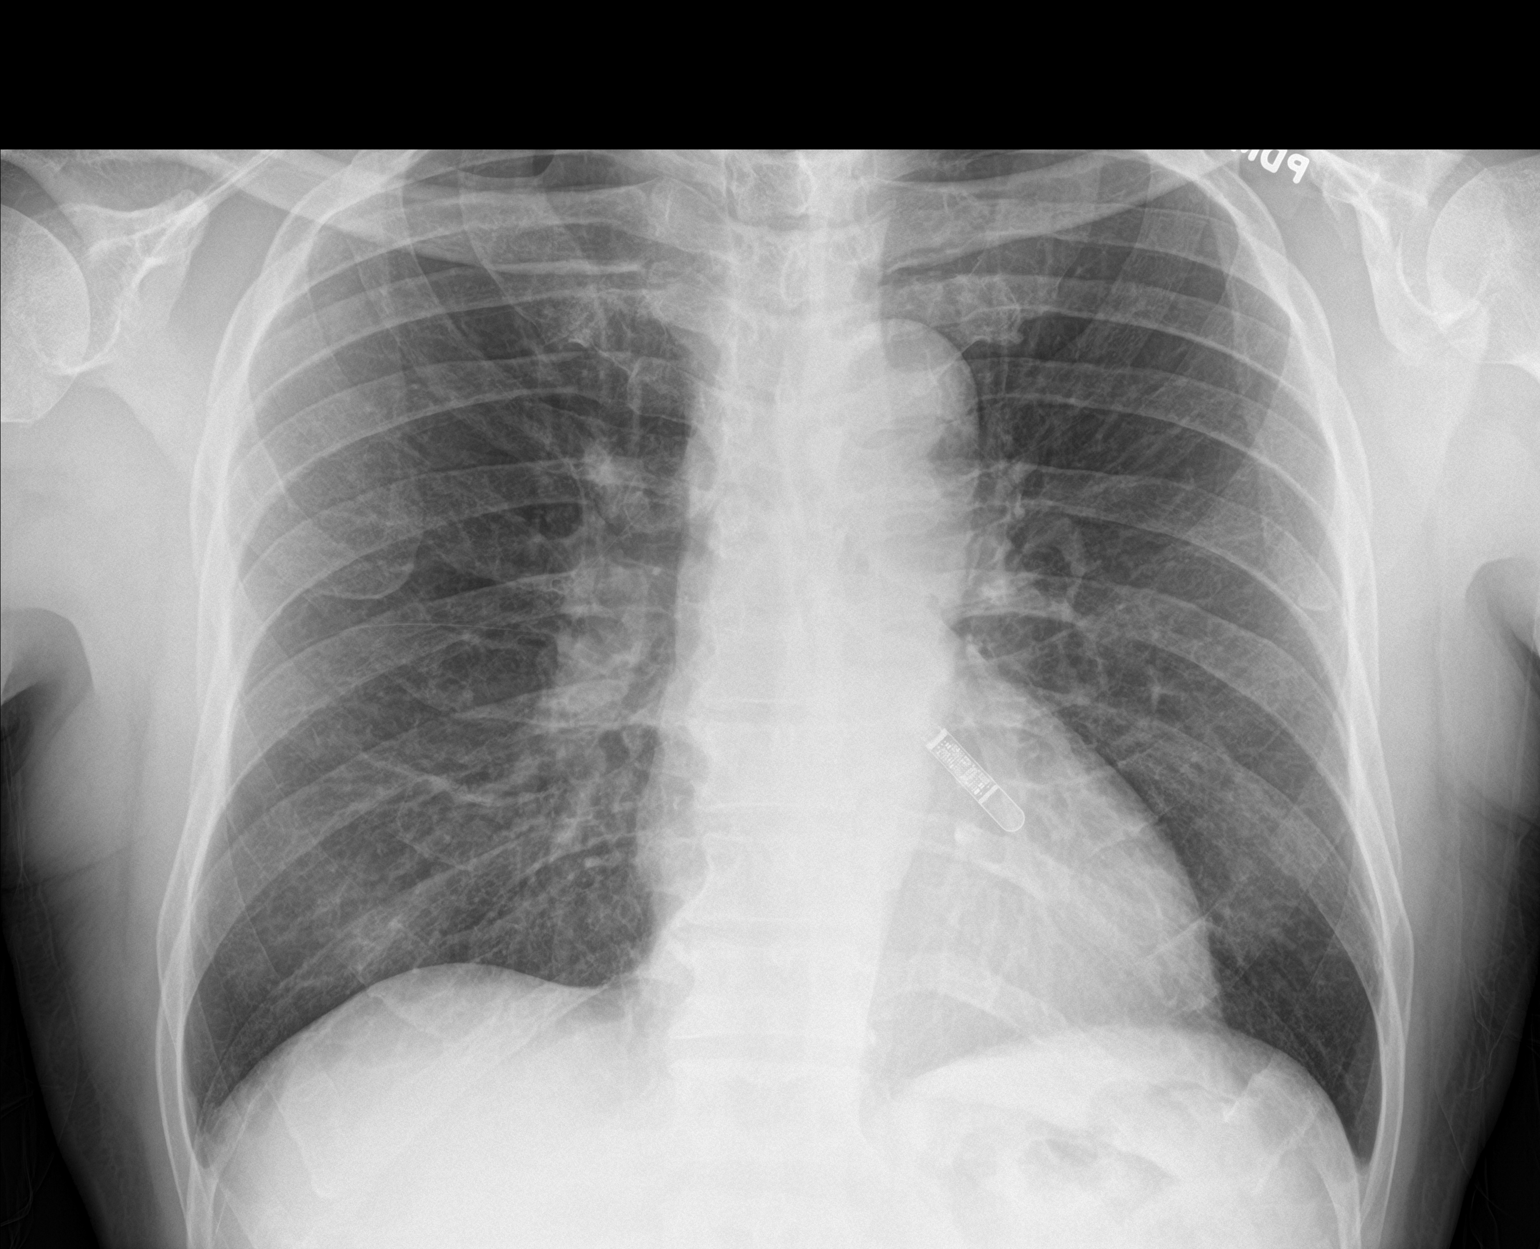

[chest lat]
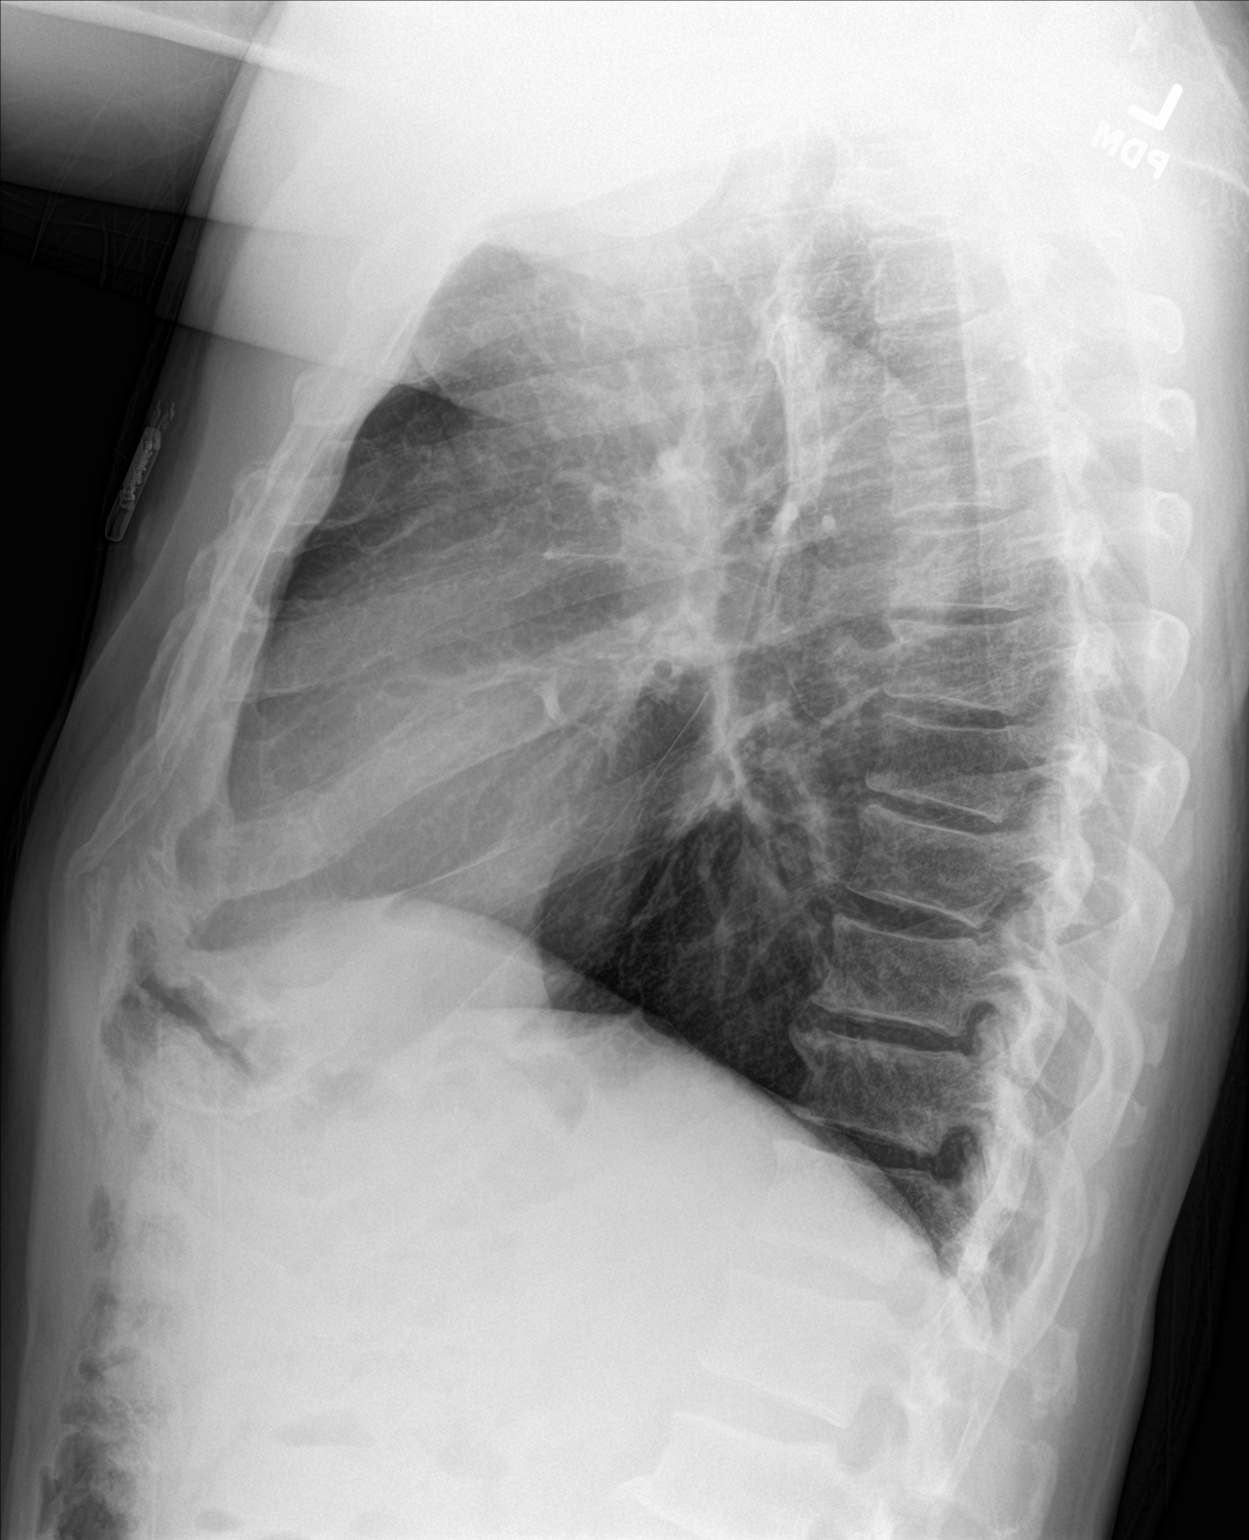

[2 of 2 positions shown; findings below may reference images not displayed]

FINDINGS: Mediastinum hilar structures normal. Cardiomegaly with normal
pulmonary vascularity. Cardiac monitoring device noted over the
chest. No pleural effusion or pneumothorax. Degenerative change
thoracic spine.
IMPRESSION: No acute cardiopulmonary disease.

## 2020-02-14 NOTE — Telephone Encounter (Signed)
Scheduled appt per 8/13 sch msg - wife aware.

## 2020-02-14 NOTE — Telephone Encounter (Signed)
-----   Message from Brunetta Genera, MD sent at 02/12/2020 11:39 PM EDT ----- Plz let patient know his ferritin <100 and so we would recommend setting him up for 1 dose of IV Injectafer. Plz schedule if patient agreeable. thx

## 2020-02-14 NOTE — Telephone Encounter (Signed)
Contacted patient regarding test results per Dr. Grier Mitts directions. Patient is agreeable to I dose IV injectafer. Schedule message sent

## 2020-02-18 ENCOUNTER — Other Ambulatory Visit: Payer: Self-pay

## 2020-02-18 ENCOUNTER — Inpatient Hospital Stay: Payer: Medicare Other

## 2020-02-18 VITALS — BP 186/82 | HR 54 | Temp 97.7°F | Resp 17

## 2020-02-18 DIAGNOSIS — N184 Chronic kidney disease, stage 4 (severe): Secondary | ICD-10-CM

## 2020-02-18 DIAGNOSIS — D631 Anemia in chronic kidney disease: Secondary | ICD-10-CM

## 2020-02-18 DIAGNOSIS — N189 Chronic kidney disease, unspecified: Secondary | ICD-10-CM | POA: Diagnosis not present

## 2020-02-18 DIAGNOSIS — E1122 Type 2 diabetes mellitus with diabetic chronic kidney disease: Secondary | ICD-10-CM | POA: Diagnosis not present

## 2020-02-18 DIAGNOSIS — I129 Hypertensive chronic kidney disease with stage 1 through stage 4 chronic kidney disease, or unspecified chronic kidney disease: Secondary | ICD-10-CM | POA: Diagnosis not present

## 2020-02-18 DIAGNOSIS — D509 Iron deficiency anemia, unspecified: Secondary | ICD-10-CM

## 2020-02-18 MED ORDER — SODIUM CHLORIDE 0.9 % IV SOLN
750.0000 mg | Freq: Once | INTRAVENOUS | Status: AC
  Administered 2020-02-18: 750 mg via INTRAVENOUS
  Filled 2020-02-18: qty 15

## 2020-02-18 MED ORDER — SODIUM CHLORIDE 0.9 % IV SOLN
INTRAVENOUS | Status: DC
  Filled 2020-02-18: qty 250

## 2020-02-18 NOTE — Patient Instructions (Signed)

## 2020-02-23 LAB — CUP PACEART REMOTE DEVICE CHECK
Date Time Interrogation Session: 20210819001001
Implantable Pulse Generator Implant Date: 20190108

## 2020-02-24 ENCOUNTER — Ambulatory Visit (INDEPENDENT_AMBULATORY_CARE_PROVIDER_SITE_OTHER): Payer: Medicare Other | Admitting: *Deleted

## 2020-02-24 DIAGNOSIS — I63512 Cerebral infarction due to unspecified occlusion or stenosis of left middle cerebral artery: Secondary | ICD-10-CM | POA: Diagnosis not present

## 2020-02-24 NOTE — Progress Notes (Signed)
[facility] Summary Report / Loop Recorder 

## 2020-02-26 DIAGNOSIS — H40023 Open angle with borderline findings, high risk, bilateral: Secondary | ICD-10-CM | POA: Diagnosis not present

## 2020-03-04 ENCOUNTER — Telehealth: Payer: Self-pay

## 2020-03-04 DIAGNOSIS — K769 Liver disease, unspecified: Secondary | ICD-10-CM

## 2020-03-04 DIAGNOSIS — D509 Iron deficiency anemia, unspecified: Secondary | ICD-10-CM

## 2020-03-04 NOTE — Telephone Encounter (Signed)
Patient is due for a 6 month MRI of his liver per Dr Ardis Hughs last office note. He was unable to reach by phone. A letter has been mailed out to contact the office and schedule this appointent.

## 2020-03-11 NOTE — Telephone Encounter (Signed)
[name] [name] this pt needs repeat MRI.  Claiborne Billings was his "CMA" but she is in [employer] now. Can you please schedule?  Thank you so much

## 2020-03-13 NOTE — Addendum Note (Signed)
Addended by: Stevan Born on: 03/13/2020 09:54 AM   Modules accepted: Orders

## 2020-03-13 NOTE — Telephone Encounter (Signed)
Left message on voicemail for patient to return call for instructions to be given regarding MRI liver without contrast.  Appointment has been scheduled for an MRI at [facility] on 03-23-20. Appointment time is 5:00pm. Patient should arrive 30 minutes prior to your appointment time for registration purposes. Patient should make certain not to have anything to eat or drink 4 hours prior to test.  Will continue efforts.

## 2020-03-13 NOTE — Telephone Encounter (Signed)
Milus Banister, MD  Stevan Born, CMA Yes, should be w/o IV contrast again. Thanks    ----- Message -----  From: Stevan Born, CMA  Sent: 03/12/2020  2:43 PM EDT  To: Milus Banister, MD  Subject: MRI Liver                     Patient needs follow up MRI of liver. Last time MRI liver without contrast was done due to GFR being 25. Should I order again without contrast?    Thank you, Elmyra Ricks

## 2020-03-13 NOTE — Telephone Encounter (Signed)
[name] [name] notified of MRI liver without contrast appointment date and time.  [name] advised to contact Scheduling Dept if the appointment date and time did not work. [name] verbalized understanding.

## 2020-03-18 ENCOUNTER — Other Ambulatory Visit: Payer: Self-pay | Admitting: Adult Health

## 2020-03-18 DIAGNOSIS — E669 Obesity, unspecified: Secondary | ICD-10-CM

## 2020-03-18 DIAGNOSIS — I639 Cerebral infarction, unspecified: Secondary | ICD-10-CM

## 2020-03-23 ENCOUNTER — Ambulatory Visit (HOSPITAL_COMMUNITY): Payer: Medicare Other

## 2020-03-23 ENCOUNTER — Telehealth: Payer: Self-pay | Admitting: Internal Medicine

## 2020-03-23 NOTE — Telephone Encounter (Signed)
Wife pf the patient called. The patient is scheduled for an MRI tomorrow (09.21.21) and the Wife wanted to know if the patient is safe to have one since he has a loop recorder. Please advice

## 2020-03-23 NOTE — Telephone Encounter (Signed)
Left detailed message per DPR on VM that patient may have MRI with LINQ loop recorder. Device Clinic # and office hors provided if patient has any questions or concerns.

## 2020-03-24 ENCOUNTER — Other Ambulatory Visit: Payer: Self-pay

## 2020-03-24 ENCOUNTER — Ambulatory Visit (HOSPITAL_COMMUNITY)
Admission: RE | Admit: 2020-03-24 | Discharge: 2020-03-24 | Disposition: A | Discharge status: Home / Self Care | Payer: Medicare Other | Source: Ambulatory Visit | Attending: Gastroenterology | Admitting: Gastroenterology

## 2020-03-24 ENCOUNTER — Other Ambulatory Visit: Payer: Self-pay | Admitting: Gastroenterology

## 2020-03-24 DIAGNOSIS — I700 Atherosclerosis of aorta: Secondary | ICD-10-CM | POA: Diagnosis not present

## 2020-03-24 DIAGNOSIS — K769 Liver disease, unspecified: Secondary | ICD-10-CM | POA: Diagnosis not present

## 2020-03-24 DIAGNOSIS — D1809 Hemangioma of other sites: Secondary | ICD-10-CM | POA: Diagnosis not present

## 2020-03-24 DIAGNOSIS — K7689 Other specified diseases of liver: Secondary | ICD-10-CM | POA: Diagnosis not present

## 2020-03-24 DIAGNOSIS — D509 Iron deficiency anemia, unspecified: Secondary | ICD-10-CM | POA: Diagnosis not present

## 2020-03-24 DIAGNOSIS — M47816 Spondylosis without myelopathy or radiculopathy, lumbar region: Secondary | ICD-10-CM | POA: Diagnosis not present

## 2020-03-29 LAB — CUP PACEART REMOTE DEVICE CHECK
Date Time Interrogation Session: 20210919002922
Implantable Pulse Generator Implant Date: 20190108

## 2020-03-30 ENCOUNTER — Ambulatory Visit (INDEPENDENT_AMBULATORY_CARE_PROVIDER_SITE_OTHER): Payer: Medicare Other | Admitting: Emergency Medicine

## 2020-03-30 DIAGNOSIS — I6930 Unspecified sequelae of cerebral infarction: Secondary | ICD-10-CM

## 2020-04-01 NOTE — Progress Notes (Signed)
[facility] Summary Report / Loop Recorder 

## 2020-04-22 ENCOUNTER — Ambulatory Visit (INDEPENDENT_AMBULATORY_CARE_PROVIDER_SITE_OTHER): Payer: Medicare Other

## 2020-04-22 DIAGNOSIS — I639 Cerebral infarction, unspecified: Secondary | ICD-10-CM | POA: Diagnosis not present

## 2020-04-22 LAB — CUP PACEART REMOTE DEVICE CHECK
Date Time Interrogation Session: 20211020021314
Implantable Pulse Generator Implant Date: 20190108

## 2020-04-28 NOTE — Progress Notes (Signed)
[facility] Summary Report / Loop Recorder 

## 2020-05-04 DIAGNOSIS — I739 Peripheral vascular disease, unspecified: Secondary | ICD-10-CM | POA: Diagnosis not present

## 2020-05-04 DIAGNOSIS — L602 Onychogryphosis: Secondary | ICD-10-CM | POA: Diagnosis not present

## 2020-05-04 DIAGNOSIS — L84 Corns and callosities: Secondary | ICD-10-CM | POA: Diagnosis not present

## 2020-05-04 DIAGNOSIS — M21622 Bunionette of left foot: Secondary | ICD-10-CM | POA: Diagnosis not present

## 2020-05-04 DIAGNOSIS — M21621 Bunionette of right foot: Secondary | ICD-10-CM | POA: Diagnosis not present

## 2020-05-04 DIAGNOSIS — M6281 Muscle weakness (generalized): Secondary | ICD-10-CM | POA: Diagnosis not present

## 2020-05-15 DIAGNOSIS — E1169 Type 2 diabetes mellitus with other specified complication: Secondary | ICD-10-CM | POA: Diagnosis not present

## 2020-05-15 DIAGNOSIS — M1000 Idiopathic gout, unspecified site: Secondary | ICD-10-CM | POA: Diagnosis not present

## 2020-05-15 DIAGNOSIS — I10 Essential (primary) hypertension: Secondary | ICD-10-CM | POA: Diagnosis not present

## 2020-05-15 DIAGNOSIS — E559 Vitamin D deficiency, unspecified: Secondary | ICD-10-CM | POA: Diagnosis not present

## 2020-05-15 DIAGNOSIS — N184 Chronic kidney disease, stage 4 (severe): Secondary | ICD-10-CM | POA: Diagnosis not present

## 2020-05-15 DIAGNOSIS — N189 Chronic kidney disease, unspecified: Secondary | ICD-10-CM | POA: Diagnosis not present

## 2020-05-15 DIAGNOSIS — D649 Anemia, unspecified: Secondary | ICD-10-CM | POA: Diagnosis not present

## 2020-05-15 DIAGNOSIS — R309 Painful micturition, unspecified: Secondary | ICD-10-CM | POA: Diagnosis not present

## 2020-05-15 DIAGNOSIS — R809 Proteinuria, unspecified: Secondary | ICD-10-CM | POA: Diagnosis not present

## 2020-05-15 DIAGNOSIS — E211 Secondary hyperparathyroidism, not elsewhere classified: Secondary | ICD-10-CM | POA: Diagnosis not present

## 2020-05-23 LAB — CUP PACEART REMOTE DEVICE CHECK
Date Time Interrogation Session: 20211120013218
Implantable Pulse Generator Implant Date: 20190108

## 2020-05-25 ENCOUNTER — Other Ambulatory Visit: Payer: Self-pay | Admitting: Adult Health

## 2020-05-25 ENCOUNTER — Ambulatory Visit (INDEPENDENT_AMBULATORY_CARE_PROVIDER_SITE_OTHER): Payer: Medicare Other

## 2020-05-25 DIAGNOSIS — I639 Cerebral infarction, unspecified: Secondary | ICD-10-CM

## 2020-05-26 DIAGNOSIS — H353131 Nonexudative age-related macular degeneration, bilateral, early dry stage: Secondary | ICD-10-CM | POA: Diagnosis not present

## 2020-05-26 DIAGNOSIS — H0102B Squamous blepharitis left eye, upper and lower eyelids: Secondary | ICD-10-CM | POA: Diagnosis not present

## 2020-05-26 DIAGNOSIS — H34212 Partial retinal artery occlusion, left eye: Secondary | ICD-10-CM | POA: Diagnosis not present

## 2020-05-26 DIAGNOSIS — H40023 Open angle with borderline findings, high risk, bilateral: Secondary | ICD-10-CM | POA: Diagnosis not present

## 2020-05-26 DIAGNOSIS — H0102A Squamous blepharitis right eye, upper and lower eyelids: Secondary | ICD-10-CM | POA: Diagnosis not present

## 2020-05-26 NOTE — Progress Notes (Signed)
[facility] Summary Report / Loop Recorder 

## 2020-06-04 ENCOUNTER — Telehealth: Payer: Self-pay | Admitting: Adult Health

## 2020-06-04 DIAGNOSIS — I70245 Atherosclerosis of native arteries of left leg with ulceration of other part of foot: Secondary | ICD-10-CM | POA: Diagnosis not present

## 2020-06-04 DIAGNOSIS — I70202 Unspecified atherosclerosis of native arteries of extremities, left leg: Secondary | ICD-10-CM | POA: Diagnosis not present

## 2020-06-04 DIAGNOSIS — I739 Peripheral vascular disease, unspecified: Secondary | ICD-10-CM | POA: Diagnosis not present

## 2020-06-04 DIAGNOSIS — L97529 Non-pressure chronic ulcer of other part of left foot with unspecified severity: Secondary | ICD-10-CM | POA: Diagnosis not present

## 2020-06-04 NOTE — Telephone Encounter (Signed)
Pts spouse is calling in wanting to see if they can ask about a procedure (angiogram and US of the side of the pts stroke) that the pt will be having and to make sure that she gave the provider that they spoke with today 06/04/2020 was given the correct information.[name] [name]

## 2020-06-04 NOTE — Telephone Encounter (Signed)
Left a detailed message for pt to call the office back regarding request on advise about having a angiogram procedure.

## 2020-06-16 ENCOUNTER — Encounter (INDEPENDENT_AMBULATORY_CARE_PROVIDER_SITE_OTHER): Payer: Medicare Other | Admitting: Ophthalmology

## 2020-06-16 ENCOUNTER — Other Ambulatory Visit: Payer: Self-pay

## 2020-06-16 ENCOUNTER — Telehealth: Payer: Self-pay | Admitting: Adult Health

## 2020-06-16 DIAGNOSIS — H35033 Hypertensive retinopathy, bilateral: Secondary | ICD-10-CM | POA: Diagnosis not present

## 2020-06-16 DIAGNOSIS — H348322 Tributary (branch) retinal vein occlusion, left eye, stable: Secondary | ICD-10-CM | POA: Diagnosis not present

## 2020-06-16 DIAGNOSIS — H43813 Vitreous degeneration, bilateral: Secondary | ICD-10-CM

## 2020-06-16 DIAGNOSIS — E113293 Type 2 diabetes mellitus with mild nonproliferative diabetic retinopathy without macular edema, bilateral: Secondary | ICD-10-CM

## 2020-06-16 DIAGNOSIS — I10 Essential (primary) hypertension: Secondary | ICD-10-CM | POA: Diagnosis not present

## 2020-06-16 DIAGNOSIS — E11319 Type 2 diabetes mellitus with unspecified diabetic retinopathy without macular edema: Secondary | ICD-10-CM

## 2020-06-16 NOTE — Telephone Encounter (Signed)
Patient spouse is calling and wanted to speak to provider about a procedure, please advise. CB is [phone]

## 2020-06-23 LAB — CUP PACEART REMOTE DEVICE CHECK
Date Time Interrogation Session: 20211221013254
Implantable Pulse Generator Implant Date: 20190108

## 2020-06-29 ENCOUNTER — Ambulatory Visit (INDEPENDENT_AMBULATORY_CARE_PROVIDER_SITE_OTHER): Payer: Medicare Other

## 2020-06-29 DIAGNOSIS — I639 Cerebral infarction, unspecified: Secondary | ICD-10-CM | POA: Diagnosis not present

## 2020-06-29 DIAGNOSIS — I739 Peripheral vascular disease, unspecified: Secondary | ICD-10-CM | POA: Diagnosis not present

## 2020-06-29 DIAGNOSIS — M21621 Bunionette of right foot: Secondary | ICD-10-CM | POA: Diagnosis not present

## 2020-06-29 DIAGNOSIS — E1351 Other specified diabetes mellitus with diabetic peripheral angiopathy without gangrene: Secondary | ICD-10-CM | POA: Diagnosis not present

## 2020-06-29 DIAGNOSIS — M21622 Bunionette of left foot: Secondary | ICD-10-CM | POA: Diagnosis not present

## 2020-07-13 NOTE — Progress Notes (Signed)
[facility] Summary Report / Loop Recorder 

## 2020-07-24 ENCOUNTER — Telehealth: Payer: Self-pay | Admitting: Adult Health

## 2020-07-24 ENCOUNTER — Other Ambulatory Visit: Payer: Self-pay | Admitting: Adult Health

## 2020-07-24 DIAGNOSIS — I70223 Atherosclerosis of native arteries of extremities with rest pain, bilateral legs: Secondary | ICD-10-CM

## 2020-07-24 NOTE — Telephone Encounter (Signed)
I received that fax today.   Orders have been placed. Needs lab appointment

## 2020-07-24 NOTE — Telephone Encounter (Signed)
Spoke to [name] [name] and informed her that [name] has placed orders.  She will call back to schedule a lab appointment.  Nothing further needed.

## 2020-07-24 NOTE — Telephone Encounter (Signed)
Spoke with spouse she stated [name] told her patient could get labs done there for [facility] in [place] or [place]  (dr Nair/dr. Stefanie Libel)  Colletta Maryland @ [facility] faxed order  for labs last week or week before.   Colletta Maryland phone [phone]  [name] (spouse) would like a call back

## 2020-08-02 LAB — CUP PACEART REMOTE DEVICE CHECK
Date Time Interrogation Session: 20220129231146
Implantable Pulse Generator Implant Date: 20190108

## 2020-08-03 ENCOUNTER — Ambulatory Visit (INDEPENDENT_AMBULATORY_CARE_PROVIDER_SITE_OTHER): Payer: Medicare Other

## 2020-08-03 DIAGNOSIS — I639 Cerebral infarction, unspecified: Secondary | ICD-10-CM

## 2020-08-06 ENCOUNTER — Telehealth: Payer: Self-pay | Admitting: Adult Health

## 2020-08-06 NOTE — Progress Notes (Signed)
  Chronic Care Management   Outreach Note  08/06/2020 Name: [name] [name] MRN: [id number] DOB: 03-18-1946  Referred by: Dorothyann Peng, NP Reason for referral : No chief complaint on file.   An unsuccessful telephone outreach was attempted today. The patient was referred to the pharmacist for assistance with care management and care coordination.   Follow Up Plan:   Carley Perdue [employer] Scheduler

## 2020-08-06 NOTE — Progress Notes (Signed)
HEMATOLOGY/ONCOLOGY CLINIC NOTE  Date of Service: 08/06/2020  Patient Care Team: [name] Peng, NP as PCP - General (Family Medicine) [name], Wynona Luna, MD (Nephrology)  CHIEF COMPLAINTS/PURPOSE OF CONSULTATION:  IDA  HISTORY OF PRESENTING ILLNESS:   [name] [name] is a wonderful 75 y.o. male who has been referred to [place] by [name] Peng NP for evaluation and management of Iron Deficiency Anemia. The pt reports that he is doing well overall.   The pt reports that he was first told that he had IDA about 3 months ago when he got labs with his PCP. Pt then saw a GI and received a Upper Endoscopy and a Colonoscopy with biopsy. He denies any bleeding concerns or bloody stools. Pt has taken home a Hemoccult test but it has not resulted. Pt has taken Asprin since his stroke in 2019. His stroke did cause residual slurred speech but he denies any other residual changes such as vision changes or neuromotor deficits. He notes that his Optometrist saw particles in his eye after his stroke. Pt also notes intermittent chronic back pain. 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 Pt has not been on any long-term acid suppressants. He has no dietary restrictions and can eat bread and other gluten products with no digestive issues. Pt's understanding is that his CKD was caused by his HTN and Diabetes. Pt reports that his Diabetes and HTN have been moderately well controlled. Pt has been taking a Vitamin D replacement for some time and has been taking PO Iron for at least 6 months. Asprin is the only blood thinner that he is currently taking. Pt has seen a Urologist recently but has not seen Nephrologist in the last few years. He states that he has been eating well and has had no recent unexpected weight loss.   Of note prior to the patient's visit today, pt has had Colonoscopy completed on 12/04/2018 with results revealing "-Two 2 to 3  mm polyps in the descending colon and in the transverse colon, removed with a cold snare. Resected and retrieved. - The examination was otherwise normal on direct and retroflexion views."   Pt has had Upper Endoscopy completed on 12/04/2018 with results revealing "-Normal esophagus. - Normal stomach. - Normal examined duodenum. - Biopsies were taken with a cold forceps for evaluation of celiac disease."  Pt has had Surgical Pathology [phone]) completed on 12/04/2018 with results revealing "1. Surgical [P], colon, descending and transverse, polyp (2) - SESSILE SERRATED POLYP WITHOUT CYTOLOGIC DYSPLASIA - INFLAMMATORY POLYP 2. Surgical [P], duodenum - DUODENAL MUCOSA WITH NO SPECIFIC HISTOPATHOLOGIC CHANGES - NEGATIVE FOR INCREASED INTRAEPITHELIAL LYMPHOCYTES OR VILLOUS ARCHITECTURAL CHANGES."  Most recent lab results (01/03/2019) of CBC w/diff & CMP is as follows: WBC at 6.8K, RBC at 9.4, HCT at 29.2, MCV at 77.1, MCHC at 32.2, RDW at 15.8, PLTs at 303.0K, Neutro Rel at 59.3, Lymphs Rel at 25.9, Monocytes Rel at 6.6, Eosinophils Rel at 7.4, Basophils Res at 0.8, Neutro Abs at 4.1K, Lymphs Abs at 1.8K, Monocytes Abs at 0.5K, Eosinophils Abs at 0.5K, Basophils Abs at 0.1K, Sodium at 140, Potassium at 4.5, Chloride at 113, OC2 at 23, Glucose at 119, BUN at 46, Creatinine at 2.70, Total Bilirubin at 0.3, Alkaline Phosphatase at 54, AST at 10, ALT at 10, Total Protein at 7.0, Albumin at 4.3, Calcium at 9.0, GFR at 28.24. 04/03/2019 Hep C Antibody is "NON-REACTIVE" 04/03/2019 Iron and  TIBC is as follows: Iron at 29, TIBC at 362, % Sat at 8  On review of systems, pt reports back pain and denies abdominal pain, leg swelling, unexpected weight loss and any other symptoms.   On PMHx the pt reports CKD, Diabetes, HTN, Stroke (2019). 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   INTERVAL HISTORY:   [name] [name] is a 75 y.o. male here for evaluation and management of  IDA. The patient's last visit with [place] was on 02/06/20. The pt reports that he is doing well overall.  The pt reports that his energy levels have been increasing. The pt notes that he is tolerating the OTC Iron pill well. He also takes a daily Multivitamin.  Lab results today 08/07/2020 of CBC w/diff and CMP is as follows: all values are WNL except for Hgb of 12.0, HCT of 35.8, Eosinophils Absolute of 0.6K, Glucose of 218, BUN of 45, Creatinine of 2.80, AST of 14, GFR est of 23. 08/07/2020 Ferritin is 152 08/07/2020 Iron sat 29% 08/07/2020 Vitamin B12 in 459  On review of systems, pt denies black/bloody stools, nose bleeds, gum bleeds, fatigue, abdominal pain, leg swelling, changes in bowel functions and any other symptoms.  MEDICAL HISTORY:  Past Medical History:  Diagnosis Date  . Anemia   . DIABETES MELLITUS, TYPE II 01/02/2007  . HYPERLIPIDEMIA 06/18/2007  . HYPERTENSION 01/02/2007  . RENAL INSUFFICIENCY 02/05/2008  . Stroke ([place])   . Stroke [facility]) 2019    SURGICAL HISTORY: Past Surgical History:  Procedure Laterality Date  . LOOP RECORDER INSERTION N/A 07/11/2017   Procedure: LOOP RECORDER INSERTION;  Surgeon: Thompson Grayer, MD;  Location: [facility];  Service: Cardiovascular;  Laterality: N/A;  . SHOULDER SURGERY     left  . TEE WITHOUT CARDIOVERSION N/A 07/11/2017   Procedure: TRANSESOPHAGEAL ECHOCARDIOGRAM (TEE);  Surgeon: Dorothy Spark, MD;  Location: [facility] ENDOSCOPY;  Service: Cardiovascular;  Laterality: N/A;  . TRANSURETHRAL RESECTION OF PROSTATE     history of retention/hematuria     SOCIAL HISTORY: Social History   Socioeconomic History  . Marital status: Married    Spouse name: Not on file  . Number of children: Not on file  . Years of education: Not on file  . Highest education level: Not on file  Occupational History  . Not on file  Tobacco Use  . Smoking status: Never Smoker  . Smokeless tobacco: Never Used  Vaping Use  . Vaping Use: Never used   Substance and Sexual Activity  . Alcohol use: No  . Drug use: No  . Sexual activity: Not on file  Other Topics Concern  . 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 Likes to go to football games.    Social Determinants of Health   Financial Resource Strain: Not on file  Food Insecurity: Not on file  Transportation Needs: Not on file  Physical Activity: Not on file  Stress: Not on file  Social Connections: Not on file  Intimate Partner Violence: Not on file    FAMILY HISTORY: Family History  Problem Relation Age of Onset  . Hypertension Mother   . Diabetes Mother   . Stroke Father   . Stroke Maternal Grandmother   .  Colon cancer Neg Hx   . Esophageal cancer Neg Hx   . Rectal cancer Neg Hx   . Stomach cancer Neg Hx     ALLERGIES:  is allergic to repatha [evolocumab], bactrim [sulfamethoxazole-trimethoprim], penicillins, statins, and sulfa drugs cross reactors.  MEDICATIONS:  Current Outpatient Medications  Medication Sig Dispense Refill  . amLODipine (NORVASC) 5 MG tablet Take 5 mg by mouth daily.    Marland Kitchen aspirin 325 MG tablet Take 325 mg by mouth daily.    . B Complex-C-Folic Acid TABS SMARTSIG:1 Capsule(s) By Mouth Daily    . calcitRIOL (ROCALTROL) 0.25 MCG capsule Take 1 capsule by mouth daily.     Marland Kitchen ezetimibe (ZETIA) 10 MG tablet TAKE 1 TABLET(10 MG) BY MOUTH DAILY 90 tablet 3  . ferrous sulfate 325 (65 FE) MG tablet Take 65 mg by mouth daily.    . hydrALAZINE (APRESOLINE) 50 MG tablet Take 50 mg by mouth 3 (three) times daily.    . Insulin Pen Needle (B-D UF III MINI PEN NEEDLES) 31G X 5 MM MISC USE TO TEST BLOOD GLUCOSE FOUR TIMES DAILY 400 each 0  . labetalol (NORMODYNE) 200 MG tablet TAKE 1 TABLET(200 MG) BY MOUTH THREE TIMES DAILY 270 tablet 3  . LANTUS SOLOSTAR 100 UNIT/ML Solostar Pen INJECT 7-8 UNITS INTO THE  SKIN EVERY NIGHT AT BEDTIME 15 mL 0  . NON FORMULARY     . ONETOUCH VERIO test strip TEST TWICE DAILY 200 strip 3  . valsartan (DIOVAN) 160 MG tablet TK 1 T PO QD    . VITAMIN D PO Take 2,000 Units by mouth.     No current facility-administered medications for this visit.    REVIEW OF SYSTEMS:   10 Point review of Systems was done is negative except as noted above.  PHYSICAL EXAMINATION: ECOG FS:1 - Symptomatic but completely ambulatory  Vitals:   08/07/20 0905  BP: (!) 177/78  Pulse: 62  Resp: 17  Temp: 98.1 F (36.7 C)  SpO2: 99%   Wt Readings from Last 3 Encounters:  02/06/20 224 lb 4.8 oz (101.7 kg)  01/08/20 226 lb (102.5 kg)  08/02/19 227 lb 1.6 oz (103 kg)   Body mass index is 29.22 kg/m.    GENERAL:alert, in no acute distress and comfortable SKIN: no acute rashes, no significant lesions EYES: conjunctiva are pink and non-injected, sclera anicteric OROPHARYNX: MMM, no exudates, no oropharyngeal erythema or ulceration NECK: supple, no JVD LYMPH:  no palpable lymphadenopathy in the cervical, axillary or inguinal regions LUNGS: clear to auscultation b/l with normal respiratory effort HEART: regular rate & rhythm ABDOMEN:  normoactive bowel sounds , non tender, not distended. Extremity: no pedal edema PSYCH: alert & oriented x 3 with fluent speech NEURO: no focal motor/sensory deficits  LABORATORY DATA:  I have reviewed the data as listed   CBC Latest Ref Rng & Units 08/07/2020 02/06/2020 01/15/2020  WBC 4.0 - 10.5 K/uL 7.2 6.9 6.9  Hemoglobin 13.0 - 17.0 g/dL 12.0(L) 11.1(L) 11.3(L)  Hematocrit 39.0 - 52.0 % 35.8(L) 33.4(L) 34.9(L)  Platelets 150 - 400 K/uL 217 238 240   . CBC    Component Value Date/Time   WBC 7.2 08/07/2020 0853   RBC 4.38 08/07/2020 0853   HGB 12.0 (L) 08/07/2020 0853   HGB 11.1 (L) 02/06/2020 0838   HCT 35.8 (L) 08/07/2020 0853   PLT 217 08/07/2020 0853   PLT 238 02/06/2020 0838   MCV 81.7 08/07/2020 0853   MCH 27.4 08/07/2020  0853  MCHC 33.5 08/07/2020 0853   RDW 13.8 08/07/2020 0853   LYMPHSABS 1.9 08/07/2020 0853   MONOABS 0.4 08/07/2020 0853   EOSABS 0.6 (H) 08/07/2020 0853   BASOSABS 0.1 08/07/2020 0853    . CMP Latest Ref Rng & Units 01/15/2020 09/04/2019 05/24/2019  Glucose 65 - 99 mg/dL 118(H) - -  BUN 7 - 25 mg/dL 43(H) 37(A) 37(A)  Creatinine 0.70 - 1.18 mg/dL 2.52(H) 2.4(A) 2.4(A)  Sodium 135 - 146 mmol/L 140 137 137  Potassium 3.5 - 5.3 mmol/L 4.1 4.8 4.3  Chloride 98 - 110 mmol/L 110 105 107  CO2 20 - 32 mmol/L 24 25(A) 23(A)  Calcium 8.6 - 10.3 mg/dL 9.2 9.5 9.1  Total Protein 6.1 - 8.1 g/dL 6.6 - -  Total Bilirubin 0.2 - 1.2 mg/dL 0.4 - -  Alkaline Phos 25 - 125 - 66 -  AST 10 - 35 U/L 15 13(A) -  ALT 9 - 46 U/L 13 14 -   . Lab Results  Component Value Date   IRON 74 08/07/2020   TIBC 255 08/07/2020   IRONPCTSAT 29 08/07/2020   (Iron and TIBC)  Lab Results  Component Value Date   FERRITIN 152 08/07/2020    . Lab Results  Component Value Date   IRON 54 02/06/2020   TIBC 288 02/06/2020   IRONPCTSAT 19 (L) 02/06/2020   (Iron and TIBC)  Lab Results  Component Value Date   FERRITIN 48 02/06/2020   . Lab Results  Component Value Date   IRON 54 02/06/2020   TIBC 288 02/06/2020   IRONPCTSAT 19 (L) 02/06/2020   (Iron and TIBC)  Lab Results  Component Value Date   FERRITIN 48 02/06/2020     12/04/2018 [phone]) Surgical Pathology   12/04/2018 Upper Endoscopy    12/04/2018 Colonoscopy    RADIOGRAPHIC STUDIES: I have personally reviewed the radiological images as listed and agreed with the findings in the report. CUP PACEART REMOTE DEVICE CHECK  Result Date: 08/02/2020 ILR summary report received. Battery status OK. Normal device function. No new symptom, tachy, brady, or pause episodes. No new AF episodes. Monthly summary reports and ROV/PRN   ASSESSMENT & PLAN:   1) Severe Iron deficiency anemia 2) Anemia due to CKD  PLAN: -Discussed pt labwork  today, 08/07/2020; Hgb stable, some progression of renal disease.  -Advise pt to continue to monitor blood sugars at home. -Advise pt f/u w Nephrologist regarding renal disease. The pt is agreeable. He sees Dr. Willene Hatchet at [facility]. -Continue daily OTC Iron and Multivitamin. -no indication for IV Iron at this time. -Discussed consolidation of care w PCP every 3-6 months. The pt is agreeable to this and confirms [name] [name] is his current PCP. -Will see back prn.  FOLLOW UP: RTC with Dr Irene Limbo as needed Continue f/u with PCP and nephrology as scheduled  The total time spent in the appointment was 20 minutes and more than 50% was on counseling and direct patient cares.  All of the patient's questions were answered with apparent satisfaction. The patient knows to call the clinic with any problems, questions or concerns.   Sullivan Lone MD [place] AAHIVMS [facility] Hematology/Oncology Physician [facility] [place]  (Office):       [phone] (Work cell):  [phone] (Fax):           [phone]  08/06/2020 12:25 PM  I, Reinaldo Raddle, am acting as scribe for Dr. Sullivan Lone, MD.    .I have reviewed the above  documentation for accuracy and completeness, and I agree with the above. Brunetta Genera MD

## 2020-08-07 ENCOUNTER — Inpatient Hospital Stay: Payer: Medicare Other

## 2020-08-07 ENCOUNTER — Inpatient Hospital Stay: Payer: Medicare Other | Attending: Hematology | Admitting: Hematology

## 2020-08-07 ENCOUNTER — Other Ambulatory Visit: Payer: Self-pay

## 2020-08-07 VITALS — BP 177/78 | HR 62 | Temp 98.1°F | Resp 17 | Ht 74.0 in | Wt 227.6 lb

## 2020-08-07 DIAGNOSIS — D631 Anemia in chronic kidney disease: Secondary | ICD-10-CM

## 2020-08-07 DIAGNOSIS — E538 Deficiency of other specified B group vitamins: Secondary | ICD-10-CM

## 2020-08-07 DIAGNOSIS — N184 Chronic kidney disease, stage 4 (severe): Secondary | ICD-10-CM

## 2020-08-07 DIAGNOSIS — N189 Chronic kidney disease, unspecified: Secondary | ICD-10-CM | POA: Insufficient documentation

## 2020-08-07 DIAGNOSIS — D509 Iron deficiency anemia, unspecified: Secondary | ICD-10-CM

## 2020-08-07 LAB — CBC WITH DIFFERENTIAL/PLATELET
Abs Immature Granulocytes: 0.01 10*3/uL (ref 0.00–0.07)
Basophils Absolute: 0.1 10*3/uL (ref 0.0–0.1)
Basophils Relative: 1 %
Eosinophils Absolute: 0.6 10*3/uL — ABNORMAL HIGH (ref 0.0–0.5)
Eosinophils Relative: 8 %
HCT: 35.8 % — ABNORMAL LOW (ref 39.0–52.0)
Hemoglobin: 12 g/dL — ABNORMAL LOW (ref 13.0–17.0)
Immature Granulocytes: 0 %
Lymphocytes Relative: 27 %
Lymphs Abs: 1.9 10*3/uL (ref 0.7–4.0)
MCH: 27.4 pg (ref 26.0–34.0)
MCHC: 33.5 g/dL (ref 30.0–36.0)
MCV: 81.7 fL (ref 80.0–100.0)
Monocytes Absolute: 0.4 10*3/uL (ref 0.1–1.0)
Monocytes Relative: 6 %
Neutro Abs: 4.2 10*3/uL (ref 1.7–7.7)
Neutrophils Relative %: 58 %
Platelets: 217 10*3/uL (ref 150–400)
RBC: 4.38 MIL/uL (ref 4.22–5.81)
RDW: 13.8 % (ref 11.5–15.5)
WBC: 7.2 10*3/uL (ref 4.0–10.5)
nRBC: 0 % (ref 0.0–0.2)

## 2020-08-07 LAB — CMP (CANCER CENTER ONLY)
ALT: 14 U/L (ref 0–44)
AST: 14 U/L — ABNORMAL LOW (ref 15–41)
Albumin: 3.9 g/dL (ref 3.5–5.0)
Alkaline Phosphatase: 58 U/L (ref 38–126)
Anion gap: 9 (ref 5–15)
BUN: 45 mg/dL — ABNORMAL HIGH (ref 8–23)
CO2: 22 mmol/L (ref 22–32)
Calcium: 9.1 mg/dL (ref 8.9–10.3)
Chloride: 110 mmol/L (ref 98–111)
Creatinine: 2.8 mg/dL — ABNORMAL HIGH (ref 0.61–1.24)
GFR, Estimated: 23 mL/min — ABNORMAL LOW (ref >60)
Glucose, Bld: 218 mg/dL — ABNORMAL HIGH (ref 70–99)
Potassium: 4.7 mmol/L (ref 3.5–5.1)
Sodium: 141 mmol/L (ref 135–145)
Total Bilirubin: 0.4 mg/dL (ref 0.3–1.2)
Total Protein: 6.9 g/dL (ref 6.5–8.1)

## 2020-08-07 LAB — IRON AND TIBC
Iron: 74 ug/dL (ref 42–163)
Saturation Ratios: 29 % (ref 20–55)
TIBC: 255 ug/dL (ref 202–409)
UIBC: 181 ug/dL (ref 117–376)

## 2020-08-07 LAB — FERRITIN: Ferritin: 152 ng/mL (ref 24–336)

## 2020-08-07 LAB — VITAMIN B12: Vitamin B-12: 459 pg/mL (ref 180–914)

## 2020-08-07 NOTE — Patient Instructions (Signed)
Thank you for choosing [facility] to provide your oncology and hematology care.   Should you have questions after your visit to the [facility] ([facility]), please contact this office at [phone] between 8:30 AM and 4:30 PM.  Voice mails left after 4:00 PM may not be returned until the following business day.  Calls received after 4:30 PM will be answered by an off-site Nurse Triage Line.    Prescription Refills:  Please have your pharmacy contact us directly for most prescription requests.  Contact the office directly for refills of narcotics (pain medications). Allow 48-72 hours for refills.  Appointments: Please contact the [facility] scheduling department [phone] for questions regarding [facility] appointment scheduling.  Contact the schedulers with any scheduling changes so that your appointment can be rescheduled in a timely manner.   Central Scheduling for [place] [phone] - Call to schedule procedures such as PET scans, CT scans, MRI, Ultrasound, etc.  To afford each patient quality time with our providers, please arrive 30 minutes before your scheduled appointment time.  If you arrive late for your appointment, you may be asked to reschedule.  We strive to give you quality time with our providers, and arriving late affects you and other patients whose appointments are after yours. If you are a no show for multiple scheduled visits, you may be dismissed from the clinic at the providers discretion.     Resources: [facility] Social Workers [phone] for additional information on assistance programs or assistance connecting with community support programs   [place] DSS  [phone]: Information regarding food stamps, Medicaid, and utility assistance [place] Access [place] [phone]   [place] [employer]'s shared-ride transportation service for eligible riders who have a disability that prevents them from riding the fixed route bus.   Medicare  Rights Center 800-333-4114 Helps people with Medicare understand their rights and benefits, navigate the Medicare system, and secure the quality healthcare they deserve [employer] 800-227-2345 Assists patients locate various types of support and financial assistance Cancer Care: 1-800-813-HOPE (4673) Provides financial assistance, online support groups, medication/co-pay assistance.   Transportation Assistance for appointments at [facility]: Transportation Coordinator [phone]  Again, thank you for choosing [facility] for your care.       

## 2020-08-11 NOTE — Progress Notes (Signed)
[facility] Summary Report / Loop Recorder 

## 2020-08-13 ENCOUNTER — Ambulatory Visit (INDEPENDENT_AMBULATORY_CARE_PROVIDER_SITE_OTHER): Payer: Medicare Other

## 2020-08-13 DIAGNOSIS — Z0000 Encounter for general adult medical examination without abnormal findings: Secondary | ICD-10-CM

## 2020-08-13 NOTE — Progress Notes (Addendum)
Subjective:   [name] [name] is a 75 y.o. male who presents for Medicare Annual/Subsequent preventive examination.  I connected with Michelene Gardener today by telephone and verified that I am speaking with the correct person using two identifiers. Location patient: home Location provider: work Persons participating in the virtual visit: patient, provider.   I discussed the limitations, risks, security and privacy concerns of performing an evaluation and management service by telephone and the availability of in person appointments. I also discussed with the patient that there may be a patient responsible charge related to this service. The patient expressed understanding and verbally consented to this telephonic visit.    Interactive audio and video telecommunications were attempted between this provider and patient, however failed, due to patient having technical difficulties OR patient did not have access to video capability.  We continued and completed visit with audio only.      Review of Systems    N/A  Cardiac Risk Factors include: advanced age (>60men, >33 women);male gender;hypertension;diabetes mellitus;dyslipidemia     Objective:    Today's Vitals   08/13/20 0901  PainSc: 5   PainLoc: Back   There is no height or weight on file to calculate BMI.  Advanced Directives 08/13/2020 08/07/2020 02/18/2020 02/06/2020 08/02/2019 03/20/2019 12/04/2018  Does Patient Have a Medical Advance Directive? Yes Yes Yes Yes Yes Yes Yes  Type of Paramedic of [place];Living will [place];Living will - [place];Living will [place];Living will [place];Living will [place];Living will  Does patient want to make changes to medical advance directive? No - Patient declined - - No - Patient declined No - Patient declined No - Patient declined No - Patient declined  Copy of  [place] in Chart? No - copy requested - - - No - copy requested - No - copy requested    Current Medications (verified) Outpatient Encounter Medications as of 08/13/2020  Medication Sig  . amLODipine (NORVASC) 5 MG tablet Take 5 mg by mouth daily.  Marland Kitchen aspirin 325 MG tablet Take 325 mg by mouth daily.  . B Complex Vitamins (VITAMIN B COMPLEX) TABS Take by mouth.  . B Complex-C-Folic Acid TABS SMARTSIG:1 Capsule(s) By Mouth Daily  . calcitRIOL (ROCALTROL) 0.25 MCG capsule Take 1 capsule by mouth daily.   . Cholecalciferol 50 MCG (2000 UT) CAPS Take by mouth.  . COMBIGAN 0.2-0.5 % ophthalmic solution Apply 1 drop to eye 2 (two) times daily.  Marland Kitchen ezetimibe (ZETIA) 10 MG tablet TAKE 1 TABLET(10 MG) BY MOUTH DAILY  . ferrous sulfate 325 (65 FE) MG tablet Take 65 mg by mouth daily.  . hydrALAZINE (APRESOLINE) 50 MG tablet Take 50 mg by mouth 3 (three) times daily.  . Insulin Pen Needle (B-D UF III MINI PEN NEEDLES) 31G X 5 MM MISC USE TO TEST BLOOD GLUCOSE FOUR TIMES DAILY  . labetalol (NORMODYNE) 200 MG tablet TAKE 1 TABLET(200 MG) BY MOUTH THREE TIMES DAILY  . LANTUS SOLOSTAR 100 UNIT/ML Solostar Pen INJECT 7-8 UNITS INTO THE SKIN EVERY NIGHT AT BEDTIME  . nateglinide (STARLIX) 60 MG tablet TAKE 1 TABLET BY MOUTH ONCE A DAY WITH MEALS  . NON FORMULARY   . ONETOUCH VERIO test strip TEST TWICE DAILY  . valsartan (DIOVAN) 160 MG tablet TK 1 T PO QD  . VITAMIN D PO Take 2,000 Units by mouth.  . [DISCONTINUED] brimonidine (ALPHAGAN P) 0.1 % SOLN Administer  1 drop into both eyes 2 (two) times a day  . [DISCONTINUED] cephALEXin (KEFLEX) 500 MG capsule Take 500 mg by mouth 3 (three) times daily.   No facility-administered encounter medications on file as of 08/13/2020.    Allergies (verified) Evolocumab, Bactrim [sulfamethoxazole-trimethoprim], Penicillins, Statins, Sulfa antibiotics, Sulfa drugs cross reactors, and Sulfamethoxazole-trimethoprim   History: Past Medical History:   Diagnosis Date  . Anemia   . DIABETES MELLITUS, TYPE II 01/02/2007  . HYPERLIPIDEMIA 06/18/2007  . HYPERTENSION 01/02/2007  . RENAL INSUFFICIENCY 02/05/2008  . Stroke ([place])   . Stroke [facility]) 2019   Past Surgical History:  Procedure Laterality Date  . LOOP RECORDER INSERTION N/A 07/11/2017   Procedure: LOOP RECORDER INSERTION;  Surgeon: Thompson Grayer, MD;  Location: [facility];  Service: Cardiovascular;  Laterality: N/A;  . SHOULDER SURGERY     left  . TEE WITHOUT CARDIOVERSION N/A 07/11/2017   Procedure: TRANSESOPHAGEAL ECHOCARDIOGRAM (TEE);  Surgeon: Dorothy Spark, MD;  [facility] ENDOSCOPY;  Service: Cardiovascular;  Laterality: N/A;  . TRANSURETHRAL RESECTION OF PROSTATE     history of retention/hematuria    Family History  Problem Relation Age of Onset  . Hypertension Mother   . Diabetes Mother   . Stroke Father   . Stroke Maternal Grandmother   . Colon cancer Neg Hx   . Esophageal cancer Neg Hx   . Rectal cancer Neg Hx   . Stomach cancer Neg Hx    Social History   Socioeconomic History  . Marital status: Married    Spouse name: Not on file  . Number of children: Not on file  . Years of education: Not on file  . Highest education level: Not on file  Occupational History  . Not on file  Tobacco Use  . Smoking status: Never Smoker  . Smokeless tobacco: Never Used  Vaping Use  . Vaping Use: Never used  Substance and Sexual Activity  . Alcohol use: No  . Drug use: No  . Sexual activity: Not on file  Other Topics Concern  . 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 Likes to go to football games.    Social Determinants of Health   Financial Resource Strain: Low Risk   . Difficulty of Paying Living Expenses: Not hard at all  Food Insecurity: No Food Insecurity  . Worried About Charity fundraiser in the  Last Year: Never true  . Ran Out of Food in the Last Year: Never true  Transportation Needs: No Transportation Needs  . Lack of Transportation (Medical): No  . Lack of Transportation (Non-Medical): No  Physical Activity: Inactive  . Days of Exercise per Week: 0 days  . Minutes of Exercise per Session: 0 min  Stress: No Stress Concern Present  . Feeling of Stress : Not at all  Social Connections: Moderately Integrated  . Frequency of Communication with Friends and Family: More than three times a week  . Frequency of Social Gatherings with Friends and Family: Once a week  . Attends Religious Services: Never  . Active Member of Clubs or Organizations: Yes  . Attends Archivist Meetings: Never  . Marital Status: Married    Tobacco Counseling Counseling given: Not Answered   Clinical Intake:  Pre-visit preparation completed: Yes  Pain : 0-10 Pain Score: 5  Pain Type: Chronic pain Pain Location: Back Pain Orientation: Lower Pain Descriptors / Indicators: Numbness Pain Onset: More than a month ago Pain Frequency: Intermittent Pain Relieving Factors: Resting  Pain Relieving Factors: Resting  Nutritional Risks: Non-healing wound Diabetes: Yes CBG done?: No Did pt. bring in CBG monitor from home?: No  How often do you need to have someone help you when you read instructions, pamphlets, or other written materials from your doctor or pharmacy?: 2 - Rarely What is the last grade level you completed in school?: College  Diabetic? Yes  Nutrition Risk Assessment:  Has the patient had any N/V/D within the last 2 months?  Yes  Does the patient have any non-healing wounds?  Yes , wound on toe that is not healing. Currently seeing podiatrist  Has the patient had any unintentional weight loss or weight gain?  No   Diabetes:  Is the patient diabetic?  Yes  If diabetic, was a CBG obtained today?  No  Did the patient bring in their glucometer from home?  No  How often do  you monitor your CBG's? Patient states checks glucose every day twice day.   Financial Strains and Diabetes Management:  Are you having any financial strains with the device, your supplies or your medication? No .  Does the patient want to be seen by Chronic Care Management for management of their diabetes?  No  Would the patient like to be referred to a Nutritionist or for Diabetic Management?  No   Diabetic Exams:  Diabetic Eye Exam: Overdue for diabetic eye exam. Pt has been advised about the importance in completing this exam. Patient advised to call and schedule an eye exam. Diabetic Foot Exam: Overdue, Pt has been advised about the importance in completing this exam. Pt is scheduled for diabetic foot exam on at next in person office .   Interpreter Needed?: No  Information entered by :: SCrews, LPN   Activities of Daily Living In your present state of health, do you have any difficulty performing the following activities: 08/13/2020  Hearing? N  Vision? N  Difficulty concentrating or making decisions? N  Walking or climbing stairs? Y  Comment Has some issues with balance when climbing stairs  Dressing or bathing? N  Doing errands, shopping? N  Preparing Food and eating ? N  Using the Toilet? N  In the past six months, have you accidently leaked urine? N  Do you have problems with loss of bowel control? N  Managing your Medications? N  Managing your Finances? N  Housekeeping or managing your Housekeeping? N  Some recent data might be hidden    Patient Care Team: Dorothyann Peng, NP as PCP - General (Family Medicine) Adegoroye, Wynona Luna, MD (Nephrology)  Indicate any recent Medical Services you may have received from other than [facility] providers in the past year (date may be approximate).     Assessment:   This is a routine wellness examination for [facility].  Hearing/Vision screen  Hearing Screening   125Hz  250Hz  500Hz  1000Hz  2000Hz  3000Hz  4000Hz  6000Hz  8000Hz   Right  ear:           Left ear:           Vision Screening Comments: Patient gets eyes examined 3x per year. Wears glasses   Dietary issues and exercise activities discussed: Current Exercise Habits: The patient does not participate in regular exercise at present  Goals    . Exercise 3x  per week (30 min per time)    . Weight (lb) < 220 lb (99.8 kg)      Depression Screen PHQ 2/9 Scores 08/13/2020 01/08/2020 01/08/2020 01/03/2019 04/06/2018 03/15/2018 07/27/2017  PHQ - 2 Score 0 0 0 0 0 0 4  PHQ- 9 Score - - - - - - 8    Fall Risk Fall Risk  08/13/2020 01/08/2020 01/08/2020 01/03/2019 04/06/2018  Falls in the past year? 0 1 1 0 No  Number falls in past yr: 0 0 0 - -  Injury with Fall? 0 0 - - -  Risk for fall due to : No Fall Risks - - - -  Follow up Falls evaluation completed;Falls prevention discussed - - - -    FALL RISK PREVENTION PERTAINING TO THE HOME:  Any stairs in or around the home? Yes  If so, are there any without handrails? No  Home free of loose throw rugs in walkways, pet beds, electrical cords, etc? Yes  Adequate lighting in your home to reduce risk of falls? Yes   ASSISTIVE DEVICES UTILIZED TO PREVENT FALLS:  Life alert? No  Use of a cane, walker or w/c? No  Grab bars in the bathroom? No  Shower chair or bench in shower? No  Elevated toilet seat or a handicapped toilet? No    Cognitive Function:   Normal cognitive status assessed by direct observation by this Nurse Health Advisor. No abnormalities found.        Immunizations Immunization History  Administered Date(s) Administered  . Influenza, High Dose Seasonal PF 06/19/2018  . PFIZER(Purple Top)SARS-COV-2 Vaccination 01/18/2020, 02/08/2020  . Pneumococcal Conjugate-13 01/03/2019  . Pneumococcal Polysaccharide-23 12/20/2011  . Td 11/13/2009    TDAP status: Due, Education has been provided regarding the importance of this vaccine. Advised may receive this vaccine at local pharmacy or Health Dept. Aware to provide a  copy of the vaccination record if obtained from local pharmacy or Health Dept. Verbalized acceptance and understanding.  Flu Vaccine status: Due, Education has been provided regarding the importance of this vaccine. Advised may receive this vaccine at local pharmacy or Health Dept. Aware to provide a copy of the vaccination record if obtained from local pharmacy or Health Dept. Verbalized acceptance and understanding.  Pneumococcal vaccine status: Up to date  Covid-19 vaccine status: Completed vaccines  Qualifies for Shingles Vaccine? Yes   Zostavax completed No   Shingrix Completed?: No.    Education has been provided regarding the importance of this vaccine. Patient has been advised to call insurance company to determine out of pocket expense if they have not yet received this vaccine. Advised may also receive vaccine at local pharmacy or Health Dept. Verbalized acceptance and understanding.  Screening Tests Health Maintenance  Topic Date Due  . INFLUENZA VACCINE  02/02/2020  . COVID-19 Vaccine (3 - Booster for Pfizer series) 08/10/2020  . Fecal DNA (Cologuard)  10/31/2020  . Hepatitis C Screening  Completed  . PNA vac Low Risk Adult  Completed    Health Maintenance  Health Maintenance Due  Topic Date Due  . INFLUENZA VACCINE  02/02/2020  . COVID-19 Vaccine (3 - Booster for Pfizer series) 08/10/2020    Colorectal cancer screening: Type of screening: Cologuard. Completed 10/31/2017. Repeat every 3 years  Lung Cancer Screening: (Low Dose CT Chest recommended if Age 18-80 years, 30 pack-year currently smoking OR have quit w/in 15years.) does not qualify.   Lung Cancer Screening Referral: N/A   Additional Screening:  Hepatitis  C Screening: does qualify; Completed 01/03/2019  Vision Screening: Recommended annual ophthalmology exams for early detection of glaucoma and other disorders of the eye. Is the patient up to date with their annual eye exam?  Yes  Who is the provider or  what is the name of the office in which the patient attends annual eye exams? Dr. Katy Fitch  If pt is not established with a provider, would they like to be referred to a provider to establish care? No .   Dental Screening: Recommended annual dental exams for proper oral hygiene  Community Resource Referral / Chronic Care Management: CRR required this visit?  No   CCM required this visit?  No      Plan:     I have personally reviewed and noted the following in the patient's chart:   . Medical and social history . Use of alcohol, tobacco or illicit drugs  . Current medications and supplements . Functional ability and status . Nutritional status . Physical activity . Advanced directives . List of other physicians . Hospitalizations, surgeries, and ER visits in previous 12 months . Vitals . Screenings to include cognitive, depression, and falls . Referrals and appointments  In addition, I have reviewed and discussed with patient certain preventive protocols, quality metrics, and best practice recommendations. A written personalized care plan for preventive services as well as general preventive health recommendations were provided to patient.     Ofilia Neas, LPN   1/58/3094   Nurse Notes: None

## 2020-08-13 NOTE — Patient Instructions (Signed)
[name] [name] , Thank you for taking time to come for your Medicare Wellness Visit. I appreciate your ongoing commitment to your health goals. Please review the following plan we discussed and let me know if I can assist you in the future.   Screening recommendations/referrals: Colonoscopy: Up to date, next due 12/03/2028 Recommended yearly ophthalmology/optometry visit for glaucoma screening and checkup Recommended yearly dental visit for hygiene and checkup  Vaccinations: Influenza vaccine: Currently due, you may receive in our office or at your local pharmacy. Pneumococcal vaccine: Completed series  Tdap vaccine: Currently due, you may await and injury to receive when it will be covered by your insurance or you may receive at your local pharmacy. Shingles vaccine: Currently due for Shingrix, if you wish to receive we recommend that you do so at your local pharmacy as it is less expensive     Advanced directives: Please bring in copies of your Advanced medical directives so that we may scan them into your chart.   Conditions/risks identified: None   Next appointment:   Preventive Care 74 Years and Older, Male Preventive care refers to lifestyle choices and visits with your health care provider that can promote health and wellness. What does preventive care include?  A yearly physical exam. This is also called an annual well check.  Dental exams once or twice a year.  Routine eye exams. Ask your health care provider how often you should have your eyes checked.  Personal lifestyle choices, including:  Daily care of your teeth and gums.  Regular physical activity.  Eating a healthy diet.  Avoiding tobacco and drug use.  Limiting alcohol use.  Practicing safe sex.  Taking low doses of aspirin every day.  Taking vitamin and mineral supplements as recommended by your health care provider. What happens during an annual well check? The services and screenings done by your  health care provider during your annual well check will depend on your age, overall health, lifestyle risk factors, and family history of disease. Counseling  Your health care provider may ask you questions about your:  Alcohol use.  Tobacco use.  Drug use.  Emotional well-being.  Home and relationship well-being.  Sexual activity.  Eating habits.  History of falls.  Memory and ability to understand (cognition).  Work and work Statistician. Screening  You may have the following tests or measurements:  Height, weight, and BMI.  Blood pressure.  Lipid and cholesterol levels. These may be checked every 5 years, or more frequently if you are over 72 years old.  Skin check.  Lung cancer screening. You may have this screening every year starting at age 40 if you have a 30-pack-year history of smoking and currently smoke or have quit within the past 15 years.  Fecal occult blood test (FOBT) of the stool. You may have this test every year starting at age 59.  Flexible sigmoidoscopy or colonoscopy. You may have a sigmoidoscopy every 5 years or a colonoscopy every 10 years starting at age 17.  Prostate cancer screening. Recommendations will vary depending on your family history and other risks.  Hepatitis C blood test.  Hepatitis B blood test.  Sexually transmitted disease (STD) testing.  Diabetes screening. This is done by checking your blood sugar (glucose) after you have not eaten for a while (fasting). You may have this done every 1-3 years.  Abdominal aortic aneurysm (AAA) screening. You may need this if you are a current or former smoker.  Osteoporosis. You may be screened  starting at age 23 if you are at high risk. Talk with your health care provider about your test results, treatment options, and if necessary, the need for more tests. Vaccines  Your health care provider may recommend certain vaccines, such as:  Influenza vaccine. This is recommended every  year.  Tetanus, diphtheria, and acellular pertussis (Tdap, Td) vaccine. You may need a Td booster every 10 years.  Zoster vaccine. You may need this after age 10.  Pneumococcal 13-valent conjugate (PCV13) vaccine. One dose is recommended after age 30.  Pneumococcal polysaccharide (PPSV23) vaccine. One dose is recommended after age 91. Talk to your health care provider about which screenings and vaccines you need and how often you need them. This information is not intended to replace advice given to you by your health care provider. Make sure you discuss any questions you have with your health care provider. Document Released: 07/17/2015 Document Revised: 03/09/2016 Document Reviewed: 04/21/2015 Elsevier Interactive Patient Education  2017 [employer] Prevention in the Home Falls can cause injuries. They can happen to people of all ages. There are many things you can do to make your home safe and to help prevent falls. What can I do on the outside of my home?  Regularly fix the edges of walkways and driveways and fix any cracks.  Remove anything that might make you trip as you walk through a door, such as a raised step or threshold.  Trim any bushes or trees on the path to your home.  Use bright outdoor lighting.  Clear any walking paths of anything that might make someone trip, such as rocks or tools.  Regularly check to see if handrails are loose or broken. Make sure that both sides of any steps have handrails.  Any raised decks and porches should have guardrails on the edges.  Have any leaves, snow, or ice cleared regularly.  Use sand or salt on walking paths during winter.  Clean up any spills in your garage right away. This includes oil or grease spills. What can I do in the bathroom?  Use night lights.  Install grab bars by the toilet and in the tub and shower. Do not use towel bars as grab bars.  Use non-skid mats or decals in the tub or shower.  If you  need to sit down in the shower, use a plastic, non-slip stool.  Keep the floor dry. Clean up any water that spills on the floor as soon as it happens.  Remove soap buildup in the tub or shower regularly.  Attach bath mats securely with double-sided non-slip rug tape.  Do not have throw rugs and other things on the floor that can make you trip. What can I do in the bedroom?  Use night lights.  Make sure that you have a light by your bed that is easy to reach.  Do not use any sheets or blankets that are too big for your bed. They should not hang down onto the floor.  Have a firm chair that has side arms. You can use this for support while you get dressed.  Do not have throw rugs and other things on the floor that can make you trip. What can I do in the kitchen?  Clean up any spills right away.  Avoid walking on wet floors.  Keep items that you use a lot in easy-to-reach places.  If you need to reach something above you, use a strong step stool that has a grab  bar.  Keep electrical cords out of the way.  Do not use floor polish or wax that makes floors slippery. If you must use wax, use non-skid floor wax.  Do not have throw rugs and other things on the floor that can make you trip. What can I do with my stairs?  Do not leave any items on the stairs.  Make sure that there are handrails on both sides of the stairs and use them. Fix handrails that are broken or loose. Make sure that handrails are as long as the stairways.  Check any carpeting to make sure that it is firmly attached to the stairs. Fix any carpet that is loose or worn.  Avoid having throw rugs at the top or bottom of the stairs. If you do have throw rugs, attach them to the floor with carpet tape.  Make sure that you have a light switch at the top of the stairs and the bottom of the stairs. If you do not have them, ask someone to add them for you. What else can I do to help prevent falls?  Wear shoes  that:  Do not have high heels.  Have rubber bottoms.  Are comfortable and fit you well.  Are closed at the toe. Do not wear sandals.  If you use a stepladder:  Make sure that it is fully opened. Do not climb a closed stepladder.  Make sure that both sides of the stepladder are locked into place.  Ask someone to hold it for you, if possible.  Clearly mark and make sure that you can see:  Any grab bars or handrails.  First and last steps.  Where the edge of each step is.  Use tools that help you move around (mobility aids) if they are needed. These include:  Canes.  Walkers.  Scooters.  Crutches.  Turn on the lights when you go into a dark area. Replace any light bulbs as soon as they burn out.  Set up your furniture so you have a clear path. Avoid moving your furniture around.  If any of your floors are uneven, fix them.  If there are any pets around you, be aware of where they are.  Review your medicines with your doctor. Some medicines can make you feel dizzy. This can increase your chance of falling. Ask your doctor what other things that you can do to help prevent falls. This information is not intended to replace advice given to you by your health care provider. Make sure you discuss any questions you have with your health care provider. Document Released: 04/16/2009 Document Revised: 11/26/2015 Document Reviewed: 07/25/2014 Elsevier Interactive Patient Education  2017 [employer].

## 2020-08-14 ENCOUNTER — Telehealth: Payer: Self-pay | Admitting: Adult Health

## 2020-08-14 NOTE — Progress Notes (Signed)
  Chronic Care Management   Note  08/14/2020 Name: [name] MRN: [id number] DOB: 01/31/1946  [name] [name] is a 75 y.o. year old male who is a primary care patient of Dorothyann Peng, NP. I reached out to [employer] by phone today in response to a referral sent by [name] [name]'s PCP, Dorothyann Peng, NP.   [name] [name] was given information about Chronic Care Management services today including:  1. CCM service includes personalized support from designated clinical staff supervised by his physician, including individualized plan of care and coordination with other care providers 2. 24/7 contact phone numbers for assistance for urgent and routine care needs. 3. Service will only be billed when office clinical staff spend 20 minutes or more in a month to coordinate care. 4. Only one practitioner may furnish and bill the service in a calendar month. 5. The patient may stop CCM services at any time (effective at the end of the month) by phone call to the office staff.   Patient agreed to services and verbal consent obtained.   Follow up plan:   [name] [name]

## 2020-08-24 DIAGNOSIS — H2513 Age-related nuclear cataract, bilateral: Secondary | ICD-10-CM | POA: Diagnosis not present

## 2020-08-24 DIAGNOSIS — H353131 Nonexudative age-related macular degeneration, bilateral, early dry stage: Secondary | ICD-10-CM | POA: Diagnosis not present

## 2020-08-24 DIAGNOSIS — H40023 Open angle with borderline findings, high risk, bilateral: Secondary | ICD-10-CM | POA: Diagnosis not present

## 2020-08-24 DIAGNOSIS — H0102B Squamous blepharitis left eye, upper and lower eyelids: Secondary | ICD-10-CM | POA: Diagnosis not present

## 2020-08-24 DIAGNOSIS — H34212 Partial retinal artery occlusion, left eye: Secondary | ICD-10-CM | POA: Diagnosis not present

## 2020-08-24 DIAGNOSIS — H0102A Squamous blepharitis right eye, upper and lower eyelids: Secondary | ICD-10-CM | POA: Diagnosis not present

## 2020-08-24 DIAGNOSIS — H35033 Hypertensive retinopathy, bilateral: Secondary | ICD-10-CM | POA: Diagnosis not present

## 2020-08-27 ENCOUNTER — Other Ambulatory Visit: Payer: Self-pay | Admitting: Adult Health

## 2020-08-27 DIAGNOSIS — I639 Cerebral infarction, unspecified: Secondary | ICD-10-CM

## 2020-08-27 DIAGNOSIS — E1169 Type 2 diabetes mellitus with other specified complication: Secondary | ICD-10-CM

## 2020-08-27 DIAGNOSIS — E669 Obesity, unspecified: Secondary | ICD-10-CM

## 2020-08-27 NOTE — Telephone Encounter (Signed)
SENT TO THE PHARMACY BY E-SCRIBE. 

## 2020-08-31 ENCOUNTER — Other Ambulatory Visit: Payer: Self-pay

## 2020-09-01 ENCOUNTER — Ambulatory Visit (INDEPENDENT_AMBULATORY_CARE_PROVIDER_SITE_OTHER): Payer: Medicare Other | Admitting: Adult Health

## 2020-09-01 ENCOUNTER — Other Ambulatory Visit (INDEPENDENT_AMBULATORY_CARE_PROVIDER_SITE_OTHER): Payer: Medicare Other

## 2020-09-01 ENCOUNTER — Encounter: Payer: Self-pay | Admitting: Adult Health

## 2020-09-01 VITALS — BP 146/78 | HR 64 | Temp 98.4°F | Resp 18 | Wt 229.4 lb

## 2020-09-01 DIAGNOSIS — E1169 Type 2 diabetes mellitus with other specified complication: Secondary | ICD-10-CM

## 2020-09-01 DIAGNOSIS — I70223 Atherosclerosis of native arteries of extremities with rest pain, bilateral legs: Secondary | ICD-10-CM

## 2020-09-01 DIAGNOSIS — E669 Obesity, unspecified: Secondary | ICD-10-CM

## 2020-09-01 LAB — PROTIME-INR
INR: 1.1 ratio — ABNORMAL HIGH (ref 0.8–1.0)
Prothrombin Time: 12.3 s (ref 9.6–13.1)

## 2020-09-01 LAB — BASIC METABOLIC PANEL
BUN: 31 mg/dL — ABNORMAL HIGH (ref 6–23)
CO2: 29 mEq/L (ref 19–32)
Calcium: 9.4 mg/dL (ref 8.4–10.5)
Chloride: 107 mEq/L (ref 96–112)
Creatinine, Ser: 2.45 mg/dL — ABNORMAL HIGH (ref 0.40–1.50)
GFR: 25.43 mL/min — ABNORMAL LOW (ref >60.00)
Glucose, Bld: 143 mg/dL — ABNORMAL HIGH (ref 70–99)
Potassium: 4.1 mEq/L (ref 3.5–5.1)
Sodium: 142 mEq/L (ref 135–145)

## 2020-09-01 LAB — CBC WITH DIFFERENTIAL/PLATELET
Basophils Absolute: 0 10*3/uL (ref 0.0–0.1)
Basophils Relative: 0.6 % (ref 0.0–3.0)
Eosinophils Absolute: 0.7 10*3/uL (ref 0.0–0.7)
Eosinophils Relative: 9.8 % — ABNORMAL HIGH (ref 0.0–5.0)
HCT: 38.6 % — ABNORMAL LOW (ref 39.0–52.0)
Hemoglobin: 13 g/dL (ref 13.0–17.0)
Lymphocytes Relative: 27.6 % (ref 12.0–46.0)
Lymphs Abs: 1.9 10*3/uL (ref 0.7–4.0)
MCHC: 33.6 g/dL (ref 30.0–36.0)
MCV: 82.8 fl (ref 78.0–100.0)
Monocytes Absolute: 0.4 10*3/uL (ref 0.1–1.0)
Monocytes Relative: 6.3 % (ref 3.0–12.0)
Neutro Abs: 3.8 10*3/uL (ref 1.4–7.7)
Neutrophils Relative %: 55.7 % (ref 43.0–77.0)
Platelets: 221 10*3/uL (ref 150.0–400.0)
RBC: 4.66 Mil/uL (ref 4.22–5.81)
RDW: 14.4 % (ref 11.5–15.5)
WBC: 6.9 10*3/uL (ref 4.0–10.5)

## 2020-09-01 LAB — POCT GLYCOSYLATED HEMOGLOBIN (HGB A1C): Hemoglobin A1C: 7.2 % — AB (ref 4.0–5.6)

## 2020-09-01 NOTE — Progress Notes (Signed)
Subjective:    Patient ID: [name] [name], male    DOB: 08-02-1945, 75 y.o.   MRN: [id number]  HPI 75 year old male who  has a past medical history of Anemia, DIABETES MELLITUS, TYPE II (01/02/2007), HYPERLIPIDEMIA (06/18/2007), HYPERTENSION (01/02/2007), RENAL INSUFFICIENCY (02/05/2008), Stroke ([place]), and Stroke ([place]) (2019).  He presents to the office today for follow-up regarding diabetes mellitus.  He is currently managed with Lantus 7 units daily and Starlix 60 mg daily   He does monitor his blood sugars at home and reports readings consistently between 80 and 150.  He has had some lows intermittently and highs ( from ice cream).   His last A1c was 6.2 in July 2021  Review of Systems See HPI   Past Medical History:  Diagnosis Date  . Anemia   . DIABETES MELLITUS, TYPE II 01/02/2007  . HYPERLIPIDEMIA 06/18/2007  . HYPERTENSION 01/02/2007  . RENAL INSUFFICIENCY 02/05/2008  . Stroke ([place])   . Stroke [facility]) 2019    Social History   Socioeconomic History  . Marital status: Married    Spouse name: Not on file  . Number of children: Not on file  . Years of education: Not on file  . Highest education level: Not on file  Occupational History  . Not on file  Tobacco Use  . Smoking status: Never Smoker  . Smokeless tobacco: Never Used  Vaping Use  . Vaping Use: Never used  Substance and Sexual Activity  . Alcohol use: No  . Drug use: No  . Sexual activity: Not on file  Other Topics Concern  . 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 Likes to go to football games.    Social Determinants of Health   Financial Resource Strain: Low Risk   . Difficulty of Paying Living Expenses: Not hard at all  Food Insecurity: No Food Insecurity  . Worried About Charity fundraiser in the Last Year: Never true  . Ran Out of Food in the Last  Year: Never true  Transportation Needs: No Transportation Needs  . Lack of Transportation (Medical): No  . Lack of Transportation (Non-Medical): No  Physical Activity: Inactive  . Days of Exercise per Week: 0 days  . Minutes of Exercise per Session: 0 min  Stress: No Stress Concern Present  . Feeling of Stress : Not at all  Social Connections: Moderately Integrated  . Frequency of Communication with Friends and Family: More than three times a week  . Frequency of Social Gatherings with Friends and Family: Once a week  . Attends Religious Services: Never  . Active Member of Clubs or Organizations: Yes  . Attends Archivist Meetings: Never  . Marital Status: Married  Human resources officer Violence: Not At Risk  . Fear of Current or Ex-Partner: No  . Emotionally Abused: No  . Physically Abused: No  . Sexually Abused: No    Past Surgical History:  Procedure Laterality Date  . LOOP RECORDER INSERTION N/A 07/11/2017   Procedure: LOOP RECORDER INSERTION;  Surgeon: Thompson Grayer, MD;  Location: [place] [facility];  Service: Cardiovascular;  Laterality: N/A;  . SHOULDER SURGERY     left  . TEE WITHOUT CARDIOVERSION N/A 07/11/2017   Procedure: TRANSESOPHAGEAL ECHOCARDIOGRAM (TEE);  Surgeon: [name]  H, MD;  Location: [facility];  Service: Cardiovascular;  Laterality: N/A;  . TRANSURETHRAL RESECTION OF PROSTATE     history of retention/hematuria     Family History  Problem Relation Age of Onset  . Hypertension Mother   . Diabetes Mother   . Stroke Father   . Stroke Maternal Grandmother   . Colon cancer Neg Hx   . Esophageal cancer Neg Hx   . Rectal cancer Neg Hx   . Stomach cancer Neg Hx     Allergies  Allergen Reactions  . Evolocumab Hypertension    Other reaction(s): Other (see comments)  . Bactrim [Sulfamethoxazole-Trimethoprim] Hives, Itching and Swelling  . Penicillins Hives    Has patient had a PCN reaction causing immediate rash, facial/tongue/throat  swelling, SOB or lightheadedness with hypotension: Unk Has patient had a PCN reaction causing severe rash involving mucus membranes or skin necrosis: Unk Has patient had a PCN reaction that required hospitalization: Unk Has patient had a PCN reaction occurring within the last 10 years: No If all of the above answers are "NO", then may proceed with Cephalosporin use. Other reaction(s): Other (see comments)  . Statins     myalgia Other reaction(s): Other (see comments)  . Sulfa Antibiotics     Other reaction(s): Other (see comments)  . Sulfa Drugs Cross Reactors Hives, Itching and Swelling  . Sulfamethoxazole-Trimethoprim Hives and Itching    Other reaction(s): Other (see comments)    Current Outpatient Medications on File Prior to Visit  Medication Sig Dispense Refill  . amLODipine (NORVASC) 5 MG tablet Take 5 mg by mouth daily.    Marland Kitchen aspirin 325 MG tablet Take 325 mg by mouth daily.    . B Complex Vitamins (VITAMIN B COMPLEX) TABS Take by mouth.    . B Complex-C-Folic Acid TABS SMARTSIG:1 Capsule(s) By Mouth Daily    . calcitRIOL (ROCALTROL) 0.25 MCG capsule Take 1 capsule by mouth daily.     . Cholecalciferol 50 MCG (2000 UT) CAPS Take by mouth.    . COMBIGAN 0.2-0.5 % ophthalmic solution Apply 1 drop to eye 2 (two) times daily.    Marland Kitchen ezetimibe (ZETIA) 10 MG tablet TAKE 1 TABLET(10 MG) BY MOUTH DAILY 90 tablet 3  . ferrous sulfate 325 (65 FE) MG tablet Take 65 mg by mouth daily.    . hydrALAZINE (APRESOLINE) 50 MG tablet Take 50 mg by mouth 3 (three) times daily.    . Insulin Pen Needle (B-D UF III MINI PEN NEEDLES) 31G X 5 MM MISC USE TO TEST BLOOD GLUCOSE FOUR TIMES DAILY 400 each 0  . labetalol (NORMODYNE) 200 MG tablet TAKE 1 TABLET(200 MG) BY MOUTH THREE TIMES DAILY 270 tablet 3  . LANTUS SOLOSTAR 100 UNIT/ML Solostar Pen INJECT 7-8 UNITS INTO THE SKIN EVERY NIGHT AT BEDTIME 6 mL 0  . nateglinide (STARLIX) 60 MG tablet TAKE 1 TABLET BY MOUTH ONCE A DAY WITH MEALS    . NON  FORMULARY     . ONETOUCH VERIO test strip TEST TWICE DAILY 200 strip 3  . valsartan (DIOVAN) 160 MG tablet TK 1 T PO QD    . VITAMIN D PO Take 2,000 Units by mouth.     No current facility-administered medications on file prior to visit.    BP (!) 146/78 (BP Location: Right Arm, Patient Position: Sitting, Cuff Size: Large)   Pulse 64   Temp 98.4 F (36.9 C) (Oral)   Resp 18   Wt 229 lb 6.4 oz (104.1  kg)   SpO2 98%   BMI 29.45 kg/m       Objective:   Physical Exam Vitals reviewed.  Constitutional:      Appearance: Normal appearance.  Cardiovascular:     Rate and Rhythm: Normal rate and regular rhythm.     Pulses: Normal pulses.     Heart sounds: Normal heart sounds.  Pulmonary:     Effort: Pulmonary effort is normal.     Breath sounds: Normal breath sounds.  Skin:    General: Skin is warm and dry.     Capillary Refill: Capillary refill takes less than 2 seconds.  Neurological:     General: No focal deficit present.     Mental Status: He is alert and oriented to person, place, and time.  Psychiatric:        Mood and Affect: Mood normal.        Behavior: Behavior normal.        Thought Content: Thought content normal.        Judgment: Judgment normal.       Assessment & Plan:  1. Diabetes mellitus type 2 in obese (HCC)  - POCT HgB A1C- 7.2  - A1c has increased slightly but ok for age.  - Work on cutting back on ice cream  - Follow up in July for [place], NP

## 2020-09-07 ENCOUNTER — Ambulatory Visit (INDEPENDENT_AMBULATORY_CARE_PROVIDER_SITE_OTHER): Payer: Medicare Other

## 2020-09-07 DIAGNOSIS — I639 Cerebral infarction, unspecified: Secondary | ICD-10-CM

## 2020-09-09 LAB — CUP PACEART REMOTE DEVICE CHECK
Date Time Interrogation Session: 20220301231816
Implantable Pulse Generator Implant Date: 20190108

## 2020-09-15 NOTE — Progress Notes (Signed)
[facility] Summary Report / Loop Recorder 

## 2020-09-21 ENCOUNTER — Telehealth: Payer: Self-pay | Admitting: Adult Health

## 2020-09-21 ENCOUNTER — Other Ambulatory Visit: Payer: Self-pay

## 2020-09-21 NOTE — Telephone Encounter (Signed)
FYI

## 2020-09-21 NOTE — Telephone Encounter (Signed)
Pts spouse is calling in stating that the pts blood sugars has been running high.  Spouse made an appointment for 09/22/2020 to see [facility]

## 2020-09-22 ENCOUNTER — Encounter: Payer: Self-pay | Admitting: Adult Health

## 2020-09-22 ENCOUNTER — Ambulatory Visit (INDEPENDENT_AMBULATORY_CARE_PROVIDER_SITE_OTHER): Payer: Medicare Other | Admitting: Adult Health

## 2020-09-22 VITALS — BP 140/80 | HR 52 | Temp 97.9°F | Wt 227.4 lb

## 2020-09-22 DIAGNOSIS — E1169 Type 2 diabetes mellitus with other specified complication: Secondary | ICD-10-CM | POA: Diagnosis not present

## 2020-09-22 DIAGNOSIS — E669 Obesity, unspecified: Secondary | ICD-10-CM

## 2020-09-22 MED ORDER — ONETOUCH VERIO W/DEVICE KIT: PACK | Status: DC

## 2020-09-22 NOTE — Progress Notes (Signed)
Subjective:    Patient ID: [name] [name], male    DOB: 05-10-1946, 75 y.o.   MRN: [id number]  HPI 75 year old male who  has a past medical history of Anemia, DIABETES MELLITUS, TYPE II (01/02/2007), HYPERLIPIDEMIA (06/18/2007), HYPERTENSION (01/02/2007), RENAL INSUFFICIENCY (02/05/2008), Stroke ([place]), and Stroke ([place]) (2019).  He presents to the office today with his wife. His blood sugars have been running high since the last time we saw each other, 22 days ago. He is currently prescribed Starlix 60 mg and Lantus 7 units daily.   His A1c increased from 6.2 ( July 2021) to 7.2 ( March 2022).   Lab Results  Component Value Date   HGBA1C 7.2 (A) 09/01/2020   At home his blood sugars have been trending into the 250's on a consistent basis.   Review of Systems See HPI   Past Medical History:  Diagnosis Date  . Anemia   . DIABETES MELLITUS, TYPE II 01/02/2007  . HYPERLIPIDEMIA 06/18/2007  . HYPERTENSION 01/02/2007  . RENAL INSUFFICIENCY 02/05/2008  . Stroke ([place])   . Stroke [facility]) 2019    Social History   Socioeconomic History  . Marital status: Married    Spouse name: Not on file  . Number of children: Not on file  . Years of education: Not on file  . Highest education level: Not on file  Occupational History  . Not on file  Tobacco Use  . Smoking status: Never Smoker  . Smokeless tobacco: Never Used  Vaping Use  . Vaping Use: Never used  Substance and Sexual Activity  . Alcohol use: No  . Drug use: No  . Sexual activity: Not on file  Other Topics Concern  . 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 Likes to go to football games.    Social Determinants of Health   Financial Resource Strain: Low Risk   . Difficulty of Paying Living Expenses: Not hard at all  Food Insecurity: No Food Insecurity  . Worried About Paediatric nurse in the Last Year: Never true  . Ran Out of Food in the Last Year: Never true  Transportation Needs: No Transportation Needs  . Lack of Transportation (Medical): No  . Lack of Transportation (Non-Medical): No  Physical Activity: Inactive  . Days of Exercise per Week: 0 days  . Minutes of Exercise per Session: 0 min  Stress: No Stress Concern Present  . Feeling of Stress : Not at all  Social Connections: Moderately Integrated  . Frequency of Communication with Friends and Family: More than three times a week  . Frequency of Social Gatherings with Friends and Family: Once a week  . Attends Religious Services: Never  . Active Member of Clubs or Organizations: Yes  . Attends Archivist Meetings: Never  . Marital Status: Married  Human resources officer Violence: Not At Risk  . Fear of Current or Ex-Partner: No  . Emotionally Abused: No  . Physically Abused: No  . Sexually Abused: No    Past Surgical History:  Procedure Laterality Date  . LOOP RECORDER INSERTION N/A 07/11/2017   Procedure: LOOP RECORDER INSERTION;  Surgeon: Thompson Grayer, MD;  Location: [place] [facility];  Service: Cardiovascular;  Laterality: N/A;  . SHOULDER SURGERY  left  . TEE WITHOUT CARDIOVERSION N/A 07/11/2017   Procedure: TRANSESOPHAGEAL ECHOCARDIOGRAM (TEE);  Surgeon: Dorothy Spark, MD;  [facility]: [facility];  Service: Cardiovascular;  Laterality: N/A;  . TRANSURETHRAL RESECTION OF PROSTATE     history of retention/hematuria     Family History  Problem Relation Age of Onset  . Hypertension Mother   . Diabetes Mother   . Stroke Father   . Stroke Maternal Grandmother   . Colon cancer Neg Hx   . Esophageal cancer Neg Hx   . Rectal cancer Neg Hx   . Stomach cancer Neg Hx     Allergies  Allergen Reactions  . Evolocumab Hypertension    Other reaction(s): Other (see comments)  . Bactrim [Sulfamethoxazole-Trimethoprim] Hives, Itching and Swelling  . Penicillins Hives    Has  patient had a PCN reaction causing immediate rash, facial/tongue/throat swelling, SOB or lightheadedness with hypotension: Unk Has patient had a PCN reaction causing severe rash involving mucus membranes or skin necrosis: Unk Has patient had a PCN reaction that required hospitalization: Unk Has patient had a PCN reaction occurring within the last 10 years: No If all of the above answers are "NO", then may proceed with Cephalosporin use. Other reaction(s): Other (see comments)  . Statins     myalgia Other reaction(s): Other (see comments)  . Sulfa Antibiotics     Other reaction(s): Other (see comments)  . Sulfa Drugs Cross Reactors Hives, Itching and Swelling  . Sulfamethoxazole-Trimethoprim Hives and Itching    Other reaction(s): Other (see comments)    Current Outpatient Medications on File Prior to Visit  Medication Sig Dispense Refill  . amLODipine (NORVASC) 5 MG tablet Take 5 mg by mouth daily.    Marland Kitchen aspirin 325 MG tablet Take 325 mg by mouth daily.    . B Complex Vitamins (VITAMIN B COMPLEX) TABS Take by mouth.    . B Complex-C-Folic Acid TABS SMARTSIG:1 Capsule(s) By Mouth Daily    . brimonidine (ALPHAGAN) 0.2 % ophthalmic solution Place 1 drop into both eyes 2 (two) times daily.    . calcitRIOL (ROCALTROL) 0.25 MCG capsule Take 1 capsule by mouth daily.     . Cholecalciferol 50 MCG (2000 UT) CAPS Take by mouth.    . dorzolamide-timolol (COSOPT) 22.3-6.8 MG/ML ophthalmic solution Place 1 drop into both eyes 2 (two) times daily.    Marland Kitchen ezetimibe (ZETIA) 10 MG tablet TAKE 1 TABLET(10 MG) BY MOUTH DAILY 90 tablet 3  . ferrous sulfate 325 (65 FE) MG tablet Take 65 mg by mouth daily.    . hydrALAZINE (APRESOLINE) 50 MG tablet Take 50 mg by mouth 3 (three) times daily.    . Insulin Pen Needle (B-D UF III MINI PEN NEEDLES) 31G X 5 MM MISC USE TO TEST BLOOD GLUCOSE FOUR TIMES DAILY 400 each 0  . labetalol (NORMODYNE) 200 MG tablet TAKE 1 TABLET(200 MG) BY MOUTH THREE TIMES DAILY 270 tablet 3   . LANTUS SOLOSTAR 100 UNIT/ML Solostar Pen INJECT 7-8 UNITS INTO THE SKIN EVERY NIGHT AT BEDTIME 6 mL 0  . nateglinide (STARLIX) 60 MG tablet TAKE 1 TABLET BY MOUTH ONCE A DAY WITH MEALS    . NON FORMULARY     . ONETOUCH VERIO test strip TEST TWICE DAILY 200 strip 3  . valsartan (DIOVAN) 160 MG tablet TK 1 T PO QD    . VITAMIN D PO Take 2,000 Units by mouth.     No current facility-administered medications on file prior to visit.  BP 140/80 (BP Location: Left Arm, Patient Position: Sitting, Cuff Size: Large)   Pulse (!) 52   Temp 97.9 F (36.6 C) (Oral)   Wt 227 lb 6.4 oz (103.1 kg)   SpO2 99%   BMI 29.20 kg/m       Objective:   Physical Exam Vitals and nursing note reviewed.  Constitutional:      Appearance: Normal appearance.  Cardiovascular:     Rate and Rhythm: Normal rate and regular rhythm.     Pulses: Normal pulses.     Heart sounds: Normal heart sounds.  Pulmonary:     Effort: Pulmonary effort is normal.     Breath sounds: Normal breath sounds.  Skin:    General: Skin is warm and dry.  Neurological:     General: No focal deficit present.     Mental Status: He is alert and oriented to person, place, and time.       Assessment & Plan:  1. Diabetes mellitus type 2 in obese ([place]) - Will have him increase Lantus from 7 units to 9 units.  - Follow up if BS not better controlled.   Dorothyann Peng, NP

## 2020-09-25 DIAGNOSIS — N189 Chronic kidney disease, unspecified: Secondary | ICD-10-CM | POA: Diagnosis not present

## 2020-09-25 DIAGNOSIS — I10 Essential (primary) hypertension: Secondary | ICD-10-CM | POA: Diagnosis not present

## 2020-09-25 DIAGNOSIS — D649 Anemia, unspecified: Secondary | ICD-10-CM | POA: Diagnosis not present

## 2020-09-25 DIAGNOSIS — M1000 Idiopathic gout, unspecified site: Secondary | ICD-10-CM | POA: Diagnosis not present

## 2020-09-25 DIAGNOSIS — R809 Proteinuria, unspecified: Secondary | ICD-10-CM | POA: Diagnosis not present

## 2020-09-25 DIAGNOSIS — R309 Painful micturition, unspecified: Secondary | ICD-10-CM | POA: Diagnosis not present

## 2020-09-25 DIAGNOSIS — N184 Chronic kidney disease, stage 4 (severe): Secondary | ICD-10-CM | POA: Diagnosis not present

## 2020-09-25 DIAGNOSIS — E1169 Type 2 diabetes mellitus with other specified complication: Secondary | ICD-10-CM | POA: Diagnosis not present

## 2020-09-29 ENCOUNTER — Other Ambulatory Visit: Payer: Self-pay | Admitting: Adult Health

## 2020-09-29 ENCOUNTER — Telehealth: Payer: Self-pay | Admitting: Pharmacist

## 2020-09-29 DIAGNOSIS — E669 Obesity, unspecified: Secondary | ICD-10-CM

## 2020-09-29 DIAGNOSIS — E782 Mixed hyperlipidemia: Secondary | ICD-10-CM

## 2020-09-29 DIAGNOSIS — E1169 Type 2 diabetes mellitus with other specified complication: Secondary | ICD-10-CM

## 2020-09-29 NOTE — Progress Notes (Signed)
Chronic Care Management Pharmacy Note  10/12/2020 Name:  [name] [name] MRN:  [id number] DOB:  1946-02-23  Subjective: [name] [name] is an 75 y.o. year old male who is a primary patient of Dorothyann Peng, NP.  The CCM team was consulted for assistance with disease management and care coordination needs.    Engaged with patient face to face for initial visit in response to provider referral for pharmacy case management and/or care coordination services.   Consent to Services:  The patient was given the following information about Chronic Care Management services today, agreed to services, and gave verbal consent: 1. CCM service includes personalized support from designated clinical staff supervised by the primary care provider, including individualized plan of care and coordination with other care providers 2. 24/7 contact phone numbers for assistance for urgent and routine care needs. 3. Service will only be billed when office clinical staff spend 20 minutes or more in a month to coordinate care. 4. Only one practitioner may furnish and bill the service in a calendar month. 5.The patient may stop CCM services at any time (effective at the end of the month) by phone call to the office staff. 6. The patient will be responsible for cost sharing (co-pay) of up to 20% of the service fee (after annual deductible is met). Patient agreed to services and consent obtained.  Patient Care Team: Dorothyann Peng, NP as PCP - General (Family Medicine) Adegoroye, Wynona Luna, MD (Nephrology) Viona Gilmore, [facility] as Pharmacist (Pharmacist)  Recent office visits: 09/22/20 Dorothyann Peng, NP: Patient presented for DM and HTN follow up. Increased Lantus from 7 units to 9 units.  09/01/20 Dorothyann Peng, NP: Patient presented for DM follow up. A1c was elevated at 7.2%.   08/13/20 Ofilia Neas, LPN: Patient presented for AWV.  Recent consult visits: 09/25/20 Gifford Shave, MD (nephrology): Patient  presented for CKD. Increased amlodipine to 5 mg BID.  09/07/20 Roma Kayser, RN (cardiology): Patient presented for remote pacemaker device check.  08/07/20 Sullivan Lone, MD (oncology): Patient presented for iron deficiency anemia. Follow up PRN.  05/26/20 Thurston Hole (optometry): Patient presented for follow up. Unable to access notes.  05/15/20 Gifford Shave, MD (nephrology): Patient presented for CKD.  05/04/20 Rosemary Holms (podiatry): Patient presented for PVD and bunion history. Unable to access notes.    Hospital visits: None in previous 6 months  Objective:  Lab Results  Component Value Date   CREATININE 2.45 (H) 09/01/2020   BUN 31 (H) 09/01/2020   GFR 25.43 (L) 09/01/2020   GFRNONAA 23 (L) 08/07/2020   GFRAA 30 05/24/2019   NA 142 09/01/2020   K 4.1 09/01/2020   CALCIUM 9.4 09/01/2020   CO2 29 09/01/2020   GLUCOSE 143 (H) 09/01/2020    Lab Results  Component Value Date/Time   HGBA1C 7.2 (A) 09/01/2020 09:45 AM   HGBA1C 6.2 (H) 01/15/2020 08:11 AM   HGBA1C 5.4 05/17/2019 10:40 AM   HGBA1C 6.5 06/19/2018 08:43 AM   HGBA1C 5.5 03/21/2018 07:14 AM   GFR 25.43 (L) 09/01/2020 07:08 AM   GFR 30.00 (L) 05/17/2019 10:40 AM   MICROALBUR 2.2 (H) 02/13/2014 09:53 AM   MICROALBUR 2.4 (H) 09/30/2013 09:50 AM    Last diabetic Eye exam:  Lab Results  Component Value Date/Time   HMDIABEYEEXA No Retinopathy 07/04/2013 12:00 AM    Last diabetic Foot exam:  Lab Results  Component Value Date/Time   HMDIABFOOTEX done 09/30/2013 12:00 AM     Lab Results  Component  Value Date   CHOL 163 01/15/2020   HDL 46 01/15/2020   LDLCALC 98 01/15/2020   LDLDIRECT 180.7 06/24/2013   TRIG 98 01/15/2020   CHOLHDL 3.5 01/15/2020    Hepatic Function Latest Ref Rng & Units 08/07/2020 01/15/2020 09/04/2019  Total Protein 6.5 - 8.1 g/dL 6.9 6.6 -  Albumin 3.5 - 5.0 g/dL 3.9 - 4.3  AST 15 - 41 U/L 14(L) 15 13(A)  ALT 0 - 44 U/L _0 Alk Phosphatase 38 - 126 U/L 58 - 66   Total Bilirubin 0.3 - 1.2 mg/dL 0.4 0.4 -  Bilirubin, Direct 0.0 - 0.3 mg/dL - - -    Lab Results  Component Value Date/Time   TSH 2.83 01/15/2020 08:11 AM   TSH 2.12 01/03/2019 10:09 AM    CBC Latest Ref Rng & Units 09/01/2020 08/07/2020 02/06/2020  WBC 4.0 - 10.5 K/uL 6.9 7.2 6.9  Hemoglobin 13.0 - 17.0 g/dL 13.0 12.0(L) 11.1(L)  Hematocrit 39.0 - 52.0 % 38.6(L) 35.8(L) 33.4(L)  Platelets 150.0 - 400.0 K/uL 221.0 217 238    Lab Results  Component Value Date/Time   VD25OH 45 09/04/2019 12:00 AM   VD25OH 50.29 11/02/2018 08:16 AM   VD25OH 46 09/18/2018 12:00 AM   VD25OH 12.19 (L) 09/19/2017 10:00 AM    Clinical ASCVD: Yes  The ASCVD Risk score Mikey Bussing DC Jr., et al., 2013) failed to calculate for the following reasons:   The patient has a prior MI or stroke diagnosis    Depression screen [facility] 2/9 08/13/2020 01/08/2020 01/08/2020  Decreased Interest 0 0 0  Down, Depressed, Hopeless 0 0 0  PHQ - 2 Score 0 0 0  Altered sleeping - - -  Tired, decreased energy - - -  Change in appetite - - -  Feeling bad or failure about yourself  - - -  Trouble concentrating - - -  Moving slowly or fidgety/restless - - -  Suicidal thoughts - - -  PHQ-9 Score - - -  Difficult doing work/chores - - -  Some recent data might be hidden      Social History   Tobacco Use  Smoking Status Never Smoker  Smokeless Tobacco Never Used   BP Readings from Last 3 Encounters:  09/22/20 140/80  09/01/20 (!) 146/78  08/07/20 (!) 177/78   Pulse Readings from Last 3 Encounters:  09/22/20 (!) 52  09/01/20 64  08/07/20 62   Wt Readings from Last 3 Encounters:  09/22/20 227 lb 6.4 oz (103.1 kg)  09/01/20 229 lb 6.4 oz (104.1 kg)  08/07/20 227 lb 9.6 oz (103.2 kg)   BMI Readings from Last 3 Encounters:  09/22/20 29.20 kg/m  09/01/20 29.45 kg/m  08/07/20 29.22 kg/m    Assessment/Interventions: Review of patient past medical history, allergies, medications, health status, including review of  consultants reports, laboratory and other test data, was performed as part of comprehensive evaluation and provision of chronic care management services.   SDOH:  (Social Determinants of Health) assessments and interventions performed: Yes SDOH Interventions   Flowsheet Row Most Recent Value  SDOH Interventions   Financial Strain Interventions Intervention Not Indicated  Transportation Interventions Intervention Not Indicated     SDOH Screenings   Alcohol Screen: Low Risk   . Last Alcohol Screening Score (AUDIT): 0  Depression (PHQ2-9): Low Risk   . PHQ-2 Score: 0  Financial Resource Strain: Low Risk   . Difficulty of Paying Living Expenses: Not hard at all  Food Insecurity:  No Food Insecurity  . Worried About Charity fundraiser in the Last Year: Never true  . Ran Out of Food in the Last Year: Never true  Housing: Low Risk   . Last Housing Risk Score: 0  Physical Activity: Inactive  . Days of Exercise per Week: 0 days  . Minutes of Exercise per Session: 0 min  Social Connections: Moderately Integrated  . Frequency of Communication with Friends and Family: More than three times a week  . Frequency of Social Gatherings with Friends and Family: Once a week  . Attends Religious Services: Never  . Active Member of Clubs or Organizations: Yes  . Attends Archivist Meetings: Never  . Marital Status: Married  Stress: No Stress Concern Present  . Feeling of Stress : Not at all  Tobacco Use: Low Risk   . Smoking Tobacco Use: Never Smoker  . Smokeless Tobacco Use: Never Used  Transportation Needs: No Transportation Needs  . Lack of Transportation (Medical): No  . Lack of Transportation (Non-Medical): No   Patient and his wife presented to the visit. His biggest complaint right now is his life being on hold with his toe problems. He experienced a lot of bleeding with one of his toes and did not report pain but also admits to having neuropathy. He was working out for an hour  and a half about 4 days a week at the gym but stopped with his toe problems.  Patient reports he has been trying to eat cleaner since the stroke. He does not eat many sweets, eats lean meat, raw veggies, steamed veggies, and not many high carb foods. He typically bakes meat and uses herbs other than salt and only has dessert on Sundays. He drinks water and measures his intake and also drinks tea with little sugar.  Patient denies any trouble falling asleep and usually sleeps from 12am-7am. He does take naps during the day and sometimes feels tired during the day but not consistently.  Patient reports that he does not feel like his medications are helping with his blood pressure and blood sugars. His wife helps him manage his medications.    CCM Care Plan  Allergies  Allergen Reactions  . Evolocumab Hypertension    Other reaction(s): Other (see comments)  . Bactrim [Sulfamethoxazole-Trimethoprim] Hives, Itching and Swelling  . Penicillins Hives    Has patient had a PCN reaction causing immediate rash, facial/tongue/throat swelling, SOB or lightheadedness with hypotension: Unk Has patient had a PCN reaction causing severe rash involving mucus membranes or skin necrosis: Unk Has patient had a PCN reaction that required hospitalization: Unk Has patient had a PCN reaction occurring within the last 10 years: No If all of the above answers are "NO", then may proceed with Cephalosporin use. Other reaction(s): Other (see comments)  . Statins     myalgia Other reaction(s): Other (see comments)  . Sulfa Antibiotics     Other reaction(s): Other (see comments)  . Sulfa Drugs Cross Reactors Hives, Itching and Swelling  . Sulfamethoxazole-Trimethoprim Hives and Itching    Other reaction(s): Other (see comments)    Medications Reviewed Today    Reviewed by Anderson Malta, [place] (Certified Medical Assistant) on 09/22/20 at [id number]  Med List Status: <None>  Medication Order Taking? Sig Documenting  Provider Last Dose Status Informant  amLODipine (NORVASC) 5 MG tablet [id number] Yes Take 5 mg by mouth daily. [provider] Taking Active   aspirin 325 MG tablet [id number] Yes Take 325 mg  by mouth daily. [provider] Taking Active   B Complex Vitamins (VITAMIN B COMPLEX) TABS [id number] Yes Take by mouth. [provider] Taking Active   B Complex-C-Folic Acid TABS [id number] Yes SMARTSIG:1 Capsule(s) By Mouth Daily [provider] Taking Active   brimonidine (ALPHAGAN) 0.2 % ophthalmic solution [id number] Yes Place 1 drop into both eyes 2 (two) times daily. [provider] Taking Active Self  calcitRIOL (ROCALTROL) 0.25 MCG capsule [id number] Yes Take 1 capsule by mouth daily.  [provider] Taking Active Multiple Informants  Cholecalciferol 50 MCG (2000 UT) CAPS [id number] Yes Take by mouth. [provider] Taking Active   dorzolamide-timolol (COSOPT) 22.3-6.8 MG/ML ophthalmic solution [id number] Yes Place 1 drop into both eyes 2 (two) times daily. [provider] Taking Active Self  ezetimibe (ZETIA) 10 MG tablet [id number] Yes TAKE 1 TABLET(10 MG) BY MOUTH DAILY Nafziger, [name], NP Taking Active   ferrous sulfate 325 (65 FE) MG tablet [id number] Yes Take 65 mg by mouth daily. [provider] Taking Active   hydrALAZINE (APRESOLINE) 50 MG tablet [id number] Yes Take 50 mg by mouth 3 (three) times daily. [provider] Taking Active   Insulin Pen Needle (B-D UF III MINI PEN NEEDLES) 31G X 5 MM MISC [id number] Yes USE TO TEST BLOOD GLUCOSE FOUR TIMES DAILY Nafziger, [name], NP Taking Active   labetalol (NORMODYNE) 200 MG tablet [id number] Yes TAKE 1 TABLET(200 MG) BY MOUTH THREE TIMES DAILY Nafziger, [name], NP Taking Active   LANTUS SOLOSTAR 100 UNIT/ML Solostar Pen [id number] Yes INJECT 7-8 UNITS INTO THE SKIN EVERY NIGHT AT BEDTIME Nafziger, [name], NP Taking Active   nateglinide (STARLIX) 60 MG tablet [id number] Yes TAKE 1  TABLET BY MOUTH ONCE A DAY WITH MEALS [provider] Taking Active   NON FORMULARY [id number] Yes  [provider] Taking Active   ONETOUCH VERIO test strip [id number] Yes TEST TWICE DAILY Nafziger, [name], NP Taking Active   valsartan (DIOVAN) 160 MG tablet [id number] Yes TK 1 T PO QD [provider] Taking Active   VITAMIN D PO [id number] Yes Take 2,000 Units by mouth. [provider] Taking Active           Patient Active Problem List   Diagnosis Date Noted  . Drug-induced myopathy 01/08/2020  . Iron deficiency anemia 04/03/2019  . Anemia of chronic renal failure, stage 4 (severe) ([place]) 04/03/2019  . Late effect of cerebrovascular accident (CVA) 12/07/2017  . Hypoglycemia   . Uncontrolled hypertension   . Hypokalemia   . Acute ischemic left MCA stroke ([place]) 07/12/2017  . Diabetes mellitus type 2 in obese ([place])   . Stage 3 chronic kidney disease ([place])   . Left middle cerebral artery stroke ([place]) 07/09/2017  . Acute embolic stroke ([place])   . Aphasia 07/08/2017  . Gout 07/21/2011  . Prostatitis, chronic 07/14/2010  . Hyperlipidemia 06/18/2007  . Hypertensive kidney disease 01/02/2007    Immunization History  Administered Date(s) Administered  . Influenza, High Dose Seasonal PF 06/19/2018  . PFIZER(Purple Top)SARS-COV-2 Vaccination 01/18/2020, 02/08/2020  . Pneumococcal Conjugate-13 01/03/2019  . Pneumococcal Polysaccharide-23 12/20/2011  . Td 11/13/2009    Conditions to be addressed/monitored:  Hypertension, Hyperlipidemia, Diabetes, Chronic Kidney Disease, Gout and glaucoma, anemia, history of stroke  Care Plan : [place]  Updates made by Viona Gilmore, [place] since 10/12/2020 12:00 AM    Problem: Problem: Hypertension, Hyperlipidemia, Diabetes, Chronic Kidney Disease, Gout and glaucoma, anemia, history of stroke  Long-Range Goal: Patient-Specific Goal   Start Date: 09/30/2020  Expected End Date: 09/30/2021  This Visit's  Progress: On track  Priority: High  Note:   Current Barriers:  . Unable to achieve control of blood pressure and diabetes   Pharmacist Clinical Goal(s):  Marland Kitchen Patient will achieve control of blood pressure as evidenced by home blood pressure readings . maintain control of diabetes as evidenced by A1c and blood sugars  through collaboration with PharmD and provider.    Interventions: . 1:1 collaboration with Dorothyann Peng, NP regarding development and update of comprehensive plan of care as evidenced by provider attestation and co-signature . Inter-disciplinary care team collaboration (see longitudinal plan of care) . Comprehensive medication review performed; medication list updated in electronic medical record  Hypertension (BP goal <140/90) -Uncontrolled -Current treatment: . Amlodipine 5 mg 1 tablet twice daily . Hydralazine 50 mg 1 tablet three times daily . Labetalol 200 mg 1 tablet three times daily . Valsartan 160 mg 1/2 tablet twice daily -Medications previously tried: benazepril atenolol-chlorthalidone  -Current home readings: 160/86, 151/77, 148/81, 151/75, 178/88, 140/71, 143/81/ 158/77, 177/91, 150/74, 144/81 -Current dietary habits: patient is limiting salt and trying to eat cleaner since the stroke -Current exercise habits: going to the gym 4 days a week  -Denies hypotensive/hypertensive symptoms -Educated on Exercise goal of 150 minutes per week; Importance of home blood pressure monitoring; Proper BP monitoring technique; -Counseled to monitor BP at home daily, document, and provide log at future appointments -Counseled on diet and exercise extensively Recommended to continue current medication  Hyperlipidemia: (LDL goal < 70) -Uncontrolled -Current treatment: . Ezetimibe 10 mg 1 tablet daily -Medications previously tried: Repatha (high BP), statins (myalgias) (pravastatin, atorvastatin, simvastatin) -Current dietary patterns: baking foods instead of frying;  eating lots of fiber -Current exercise habits: going to the gym 4 days a week -Educated on Cholesterol goals;  Importance of limiting foods high in cholesterol; Exercise goal of 150 minutes per week; -Counseled on diet and exercise extensively Recommended to continue current medication  History of stroke (Goal: prevent recurrent strokes) -Controlled -Current treatment  . Aspirin 325 mg 1 tablet daily -Medications previously tried: none  -Recommended to continue current medication Counseled on monitoring for signs of bleeding such as unexplained and excessive bleeding from a cut or injury, easy or excessive bruising, blood in urine or stools, and nosebleeds without a known cause  Diabetes (A1c goal <8%) (per PCP) -Controlled -Current medications: . Lantus inject 9 units daily . Nateglinide 60 mg 1 tablet once daily with meals (needs to be 15 mins before meals) -Medications previously tried: glyburide, Novolog, Januvia -Current home glucose readings . fasting glucose: 181, 146, 148, 157, 148 . post prandial glucose: 203, 207, 190, 205, 170 -Denies hypoglycemic/hyperglycemic symptoms -Current meal patterns:  . breakfast: did not discuss  . lunch: did not discuss   . dinner: did not discuss  . snacks: did not discuss  . drinks: did not discuss  -Current exercise: going to the gym 4 days a week -Educated on Exercise goal of 150 minutes per week; Benefits of routine self-monitoring of blood sugar; Carbohydrate counting and/or plate method -Counseled to check feet daily and get yearly eye exams -Counseled on diet and exercise extensively Recommended to continue current medication Counseled on possible benefits of SGTL2 therapy with CKD benefits  Anemia (Goal: Hgb > 11) -Controlled -Current treatment  . Ferrous sulfate 325 mg 1 tablet daily -Medications previously tried: none  -Recommended to continue current medication Counseled on taking  with food to prevent upset  stomach  Chronic kidney disease (Goal: prevent worsening of kidney function) -Controlled -Current treatment  . No medications -Medications previously tried: none  -Counseled on diet and exercise extensively  Glaucoma (Goal: lower intraocular pressure) -Controlled -Current treatment  . Alphagan 0.2% daily . Cosopt 22.3-6.8 daily -Medications previously tried: none  -Recommended to continue current medication    Health Maintenance -Vaccine gaps: tetanus, shingles -Current therapy:  Marland Kitchen Vitamin D 2000 units daily . Vitamin B complex daily -Educated on Cost vs benefit of each product must be carefully weighed by individual consumer -Patient is satisfied with current therapy and denies issues -Recommended to continue current medication  Patient Goals/Self-Care Activities . Patient will:  - take medications as prescribed check glucose daily, document, and provide at future appointments check blood pressure a few times daily, document, and provide at future appointments  Follow Up Plan: Telephone follow up appointment with care management team member scheduled for: 3 months       Medication Assistance: None required.  Patient affirms current coverage meets needs.  Patient's preferred pharmacy is:  [facility] [place], [address] AT [address] [place] [address] Phone: [phone] Fax: [phone]  Uses pill box? No - basket on the counter Pt endorses 100% compliance  We discussed: Current pharmacy is preferred with insurance plan and patient is satisfied with pharmacy services Patient  to: Continue current medication management strategy  Care Plan and Follow Up Patient Decision:  Patient agrees to Care Plan and Follow-up.  Plan: Telephone follow up appointment with care management team member scheduled for:  3 months  Jeni Salles, PharmD [place] Pharmacist [place]  at [place] [phone]

## 2020-09-29 NOTE — Chronic Care Management (AMB) (Signed)
    Chronic Care Management Pharmacy Assistant   Name: [name] [name]  MRN: [id number] DOB: 12/26/1945  Reason for Encounter: Initial Questions for Pharmacist visit on 09-30-2020 Patient Questions:  Spoke with his wife [name] [name]) HIPPA 1. Have you seen any other providers since your last visit?  a. [facility] [place] 2. Any changes in your medications or health?  a. Changes Lantus Solostar 7 units to 9 units 3. Any side effects from any medications? No 4. Do you have an symptoms or problems not managed by your medications? No 5. Any concerns about your health right now? No 6. Has your provider asked that you check blood pressure, blood sugar, or follow special diet at home?  a. Daily blood pressure and blood sugar levels 7. Do you get any type of exercise on a regular basis?  a. He was walking but has stopped. 8. Can you think of a goal you would like to reach for your health?  a. More active  9. Do you have any problems getting your medications? No 10. Is there anything that you would like to discuss during the appointment? No  The patient was asked to please bring medications, blood pressure/ blood sugar log, and supplements to his appointment.        Medications: Outpatient Encounter Medications as of 09/29/2020  Medication Sig  . amLODipine (NORVASC) 5 MG tablet Take 5 mg by mouth daily.  Marland Kitchen aspirin 325 MG tablet Take 325 mg by mouth daily.  . B Complex Vitamins (VITAMIN B COMPLEX) TABS Take by mouth.  . B Complex-C-Folic Acid TABS SMARTSIG:1 Capsule(s) By Mouth Daily  . Blood Glucose Monitoring Suppl (ONETOUCH VERIO) w/Device KIT Use with existing test strips  . brimonidine (ALPHAGAN) 0.2 % ophthalmic solution Place 1 drop into both eyes 2 (two) times daily.  . calcitRIOL (ROCALTROL) 0.25 MCG capsule Take 1 capsule by mouth daily.   . Cholecalciferol 50 MCG (2000 UT) CAPS Take by mouth.  . dorzolamide-timolol (COSOPT) 22.3-6.8 MG/ML ophthalmic solution Place 1 drop  into both eyes 2 (two) times daily.  Marland Kitchen ezetimibe (ZETIA) 10 MG tablet TAKE 1 TABLET(10 MG) BY MOUTH DAILY  . ferrous sulfate 325 (65 FE) MG tablet Take 65 mg by mouth daily.  . hydrALAZINE (APRESOLINE) 50 MG tablet Take 50 mg by mouth 3 (three) times daily.  . Insulin Pen Needle (B-D UF III MINI PEN NEEDLES) 31G X 5 MM MISC USE TO TEST BLOOD GLUCOSE FOUR TIMES DAILY  . labetalol (NORMODYNE) 200 MG tablet TAKE 1 TABLET(200 MG) BY MOUTH THREE TIMES DAILY  . LANTUS SOLOSTAR 100 UNIT/ML Solostar Pen INJECT 7-8 UNITS INTO THE SKIN EVERY NIGHT AT BEDTIME (Patient taking differently: Inject 9 Units into the skin at bedtime.)  . nateglinide (STARLIX) 60 MG tablet TAKE 1 TABLET BY MOUTH ONCE A DAY WITH MEALS  . NON FORMULARY   . ONETOUCH VERIO test strip TEST TWICE DAILY  . valsartan (DIOVAN) 160 MG tablet TK 1 T PO QD  . VITAMIN D PO Take 2,000 Units by mouth.   No facility-administered encounter medications on file as of 09/29/2020.   Star Rating Drugs:  Dispensed Quantity Pharmacy  Valsartan 160 mg 02.22.2022 30 [facility]   [place] ([place]) [place], [place] [phone]

## 2020-09-30 ENCOUNTER — Other Ambulatory Visit: Payer: Self-pay

## 2020-09-30 ENCOUNTER — Ambulatory Visit (INDEPENDENT_AMBULATORY_CARE_PROVIDER_SITE_OTHER): Payer: Medicare Other | Admitting: Pharmacist

## 2020-09-30 DIAGNOSIS — E782 Mixed hyperlipidemia: Secondary | ICD-10-CM

## 2020-09-30 DIAGNOSIS — I10 Essential (primary) hypertension: Secondary | ICD-10-CM | POA: Diagnosis not present

## 2020-09-30 DIAGNOSIS — E669 Obesity, unspecified: Secondary | ICD-10-CM

## 2020-09-30 DIAGNOSIS — E1169 Type 2 diabetes mellitus with other specified complication: Secondary | ICD-10-CM

## 2020-09-30 DIAGNOSIS — G720 Drug-induced myopathy: Secondary | ICD-10-CM

## 2020-10-07 ENCOUNTER — Telehealth: Payer: Self-pay | Admitting: Internal Medicine

## 2020-10-07 NOTE — Telephone Encounter (Signed)
Called to advise the battery in the ILR is still working at this time. Advised when the battery reaches RRT we will call and discuss an plan from there. Advised to call with further questions or concerns. Thankful for call.

## 2020-10-07 NOTE — Telephone Encounter (Signed)
Patient's wife states the patient has had his loop recorder for 3 years and it hasn't picked up any irregularities. She would like to know if it needs to be removed.

## 2020-10-10 LAB — CUP PACEART REMOTE DEVICE CHECK
Date Time Interrogation Session: 20220402001935
Implantable Pulse Generator Implant Date: 20190108

## 2020-10-11 NOTE — Progress Notes (Incomplete)
Chronic Care Management Pharmacy Note  09/29/2020 Name:  [name] [name] MRN:  [id number] DOB:  September 12, 1945  Subjective: [name] [name] is an 75 y.o. year old male who is a primary patient of Dorothyann Peng, NP.  The CCM team was consulted for assistance with disease management and care coordination needs.    Engaged with patient face to face for initial visit in response to provider referral for pharmacy case management and/or care coordination services.   Consent to Services:  The patient was given the following information about Chronic Care Management services today, agreed to services, and gave verbal consent: 1. CCM service includes personalized support from designated clinical staff supervised by the primary care provider, including individualized plan of care and coordination with other care providers 2. 24/7 contact phone numbers for assistance for urgent and routine care needs. 3. Service will only be billed when office clinical staff spend 20 minutes or more in a month to coordinate care. 4. Only one practitioner may furnish and bill the service in a calendar month. 5.The patient may stop CCM services at any time (effective at the end of the month) by phone call to the office staff. 6. The patient will be responsible for cost sharing (co-pay) of up to 20% of the service fee (after annual deductible is met). Patient agreed to services and consent obtained.  Patient Care Team: Dorothyann Peng, NP as PCP - General (Family Medicine) Adegoroye, Wynona Luna, MD (Nephrology) Viona Gilmore, [facility] as Pharmacist (Pharmacist)  Recent office visits: 09/22/20 Dorothyann Peng, NP: Patient presented for DM and HTN follow up. Increased Lantus from 7 units to 9 units.  09/01/20 Dorothyann Peng, NP: Patient presented for DM follow up. A1c was elevated at 7.2%.   08/13/20 Ofilia Neas, LPN: Patient presented for AWV.  Recent consult visits: 09/25/20 Gifford Shave, MD (nephrology): Patient  presented for CKD. Increased amlodipine to 5 mg BID.  09/07/20 Roma Kayser, RN (cardiology): Patient presented for remote pacemaker device check.  08/07/20 Sullivan Lone, MD (oncology): Patient presented for iron deficiency anemia. Follow up PRN.  05/26/20 Thurston Hole (optometry): Patient presented for follow up. Unable to access notes.  05/15/20 Gifford Shave, MD (nephrology): Patient presented for CKD.  05/04/20 Rosemary Holms (podiatry): Patient presented for PVD and bunion history. Unable to access notes.    Hospital visits: None in previous 6 months  Objective:  Lab Results  Component Value Date   CREATININE 2.45 (H) 09/01/2020   BUN 31 (H) 09/01/2020   GFR 25.43 (L) 09/01/2020   GFRNONAA 23 (L) 08/07/2020   GFRAA 30 05/24/2019   NA 142 09/01/2020   K 4.1 09/01/2020   CALCIUM 9.4 09/01/2020   CO2 29 09/01/2020   GLUCOSE 143 (H) 09/01/2020    Lab Results  Component Value Date/Time   HGBA1C 7.2 (A) 09/01/2020 09:45 AM   HGBA1C 6.2 (H) 01/15/2020 08:11 AM   HGBA1C 5.4 05/17/2019 10:40 AM   HGBA1C 6.5 06/19/2018 08:43 AM   HGBA1C 5.5 03/21/2018 07:14 AM   GFR 25.43 (L) 09/01/2020 07:08 AM   GFR 30.00 (L) 05/17/2019 10:40 AM   MICROALBUR 2.2 (H) 02/13/2014 09:53 AM   MICROALBUR 2.4 (H) 09/30/2013 09:50 AM    Last diabetic Eye exam:  Lab Results  Component Value Date/Time   HMDIABEYEEXA No Retinopathy 07/04/2013 12:00 AM    Last diabetic Foot exam:  Lab Results  Component Value Date/Time   HMDIABFOOTEX done 09/30/2013 12:00 AM     Lab Results  Component  Value Date   CHOL 163 01/15/2020   HDL 46 01/15/2020   LDLCALC 98 01/15/2020   LDLDIRECT 180.7 06/24/2013   TRIG 98 01/15/2020   CHOLHDL 3.5 01/15/2020    Hepatic Function Latest Ref Rng & Units 08/07/2020 01/15/2020 09/04/2019  Total Protein 6.5 - 8.1 g/dL 6.9 6.6 -  Albumin 3.5 - 5.0 g/dL 3.9 - 4.3  AST 15 - 41 U/L 14(L) 15 13(A)  ALT 0 - 44 U/L $Remo'14 13 14  'oOsbD$ Alk Phosphatase 38 - 126 U/L 58 - 66   Total Bilirubin 0.3 - 1.2 mg/dL 0.4 0.4 -  Bilirubin, Direct 0.0 - 0.3 mg/dL - - -    Lab Results  Component Value Date/Time   TSH 2.83 01/15/2020 08:11 AM   TSH 2.12 01/03/2019 10:09 AM    CBC Latest Ref Rng & Units 09/01/2020 08/07/2020 02/06/2020  WBC 4.0 - 10.5 K/uL 6.9 7.2 6.9  Hemoglobin 13.0 - 17.0 g/dL 13.0 12.0(L) 11.1(L)  Hematocrit 39.0 - 52.0 % 38.6(L) 35.8(L) 33.4(L)  Platelets 150.0 - 400.0 K/uL 221.0 217 238    Lab Results  Component Value Date/Time   VD25OH 45 09/04/2019 12:00 AM   VD25OH 50.29 11/02/2018 08:16 AM   VD25OH 46 09/18/2018 12:00 AM   VD25OH 12.19 (L) 09/19/2017 10:00 AM    Clinical ASCVD: Yes  The ASCVD Risk score Mikey Bussing DC Jr., et al., 2013) failed to calculate for the following reasons:   The patient has a prior MI or stroke diagnosis    Depression screen [facility] 2/9 08/13/2020 01/08/2020 01/08/2020  Decreased Interest 0 0 0  Down, Depressed, Hopeless 0 0 0  PHQ - 2 Score 0 0 0  Altered sleeping - - -  Tired, decreased energy - - -  Change in appetite - - -  Feeling bad or failure about yourself  - - -  Trouble concentrating - - -  Moving slowly or fidgety/restless - - -  Suicidal thoughts - - -  PHQ-9 Score - - -  Difficult doing work/chores - - -  Some recent data might be hidden      Social History   Tobacco Use  Smoking Status Never Smoker  Smokeless Tobacco Never Used   BP Readings from Last 3 Encounters:  09/22/20 140/80  09/01/20 (!) 146/78  08/07/20 (!) 177/78   Pulse Readings from Last 3 Encounters:  09/22/20 (!) 52  09/01/20 64  08/07/20 62   Wt Readings from Last 3 Encounters:  09/22/20 227 lb 6.4 oz (103.1 kg)  09/01/20 229 lb 6.4 oz (104.1 kg)  08/07/20 227 lb 9.6 oz (103.2 kg)   BMI Readings from Last 3 Encounters:  09/22/20 29.20 kg/m  09/01/20 29.45 kg/m  08/07/20 29.22 kg/m    Assessment/Interventions: Review of patient past medical history, allergies, medications, health status, including review of  consultants reports, laboratory and other test data, was performed as part of comprehensive evaluation and provision of chronic care management services.   SDOH:  (Social Determinants of Health) assessments and interventions performed: {yes/no:20286}  SDOH Screenings   Alcohol Screen: Low Risk   . Last Alcohol Screening Score (AUDIT): 0  Depression (PHQ2-9): Low Risk   . PHQ-2 Score: 0  Financial Resource Strain: Low Risk   . Difficulty of Paying Living Expenses: Not hard at all  Food Insecurity: No Food Insecurity  . Worried About Charity fundraiser in the Last Year: Never true  . Ran Out of Food in the Last Year: Never true  Housing: Low Risk   . Last Housing Risk Score: 0  Physical Activity: Inactive  . Days of Exercise per Week: 0 days  . Minutes of Exercise per Session: 0 min  Social Connections: Moderately Integrated  . Frequency of Communication with Friends and Family: More than three times a week  . Frequency of Social Gatherings with Friends and Family: Once a week  . Attends Religious Services: Never  . Active Member of Clubs or Organizations: Yes  . Attends Archivist Meetings: Never  . Marital Status: Married  Stress: No Stress Concern Present  . Feeling of Stress : Not at all  Tobacco Use: Low Risk   . Smoking Tobacco Use: Never Smoker  . Smokeless Tobacco Use: Never Used  Transportation Needs: No Transportation Needs  . Lack of Transportation (Medical): No  . Lack of Transportation (Non-Medical): No   Patient and his wife presented to the visit. His biggest complaint right now is his life being on hold with his toe problems. He experienced a lot of bleeding with one of his toes and did not report pain but also admits to having neuropathy. He was working out for an hour and a half about 4 days a week at the gym but stopped with his toe problems.  Patient reports he has been trying to eat cleaner since the stroke. He does not eat many sweets, eats lean  meat, raw veggies, steamed veggies, and not many high carb foods. He typically bakes meat and uses herbs other than salt and only has dessert on Sundays. He drinks water and measures his intake and also drinks tea with little sugar.  Patient denies any trouble falling asleep and usually sleeps from 12am-7am. He does take naps during the day and sometimes feels tired during the day but not consistently.  Patient reports that he does not feel like his medications are helping with his blood pressure and blood sugars. His wife helps him manage his medications.    CCM Care Plan  Allergies  Allergen Reactions  . Evolocumab Hypertension    Other reaction(s): Other (see comments)  . Bactrim [Sulfamethoxazole-Trimethoprim] Hives, Itching and Swelling  . Penicillins Hives    Has patient had a PCN reaction causing immediate rash, facial/tongue/throat swelling, SOB or lightheadedness with hypotension: Unk Has patient had a PCN reaction causing severe rash involving mucus membranes or skin necrosis: Unk Has patient had a PCN reaction that required hospitalization: Unk Has patient had a PCN reaction occurring within the last 10 years: No If all of the above answers are "NO", then may proceed with Cephalosporin use. Other reaction(s): Other (see comments)  . Statins     myalgia Other reaction(s): Other (see comments)  . Sulfa Antibiotics     Other reaction(s): Other (see comments)  . Sulfa Drugs Cross Reactors Hives, Itching and Swelling  . Sulfamethoxazole-Trimethoprim Hives and Itching    Other reaction(s): Other (see comments)    Medications Reviewed Today    Reviewed by Anderson Malta, [place] (Certified Medical Assistant) on 09/22/20 at [phone]  Med List Status: <None>  Medication Order Taking? Sig Documenting Provider Last Dose Status Informant  amLODipine (NORVASC) 5 MG tablet [id number] Yes Take 5 mg by mouth daily. [provider] Taking Active   aspirin 325 MG tablet [id number] Yes Take  325 mg by mouth daily. [provider] Taking Active   B Complex Vitamins (VITAMIN B COMPLEX) TABS [id number] Yes Take by mouth. [provider] Taking Active   B  Complex-C-Folic Acid TABS [id number] Yes SMARTSIG:1 Capsule(s) By Mouth Daily [provider] Taking Active   brimonidine (ALPHAGAN) 0.2 % ophthalmic solution [id number] Yes Place 1 drop into both eyes 2 (two) times daily. [provider] Taking Active Self  calcitRIOL (ROCALTROL) 0.25 MCG capsule [id number] Yes Take 1 capsule by mouth daily.  [provider] Taking Active Multiple Informants  Cholecalciferol 50 MCG (2000 UT) CAPS [id number] Yes Take by mouth. [provider] Taking Active   dorzolamide-timolol (COSOPT) 22.3-6.8 MG/ML ophthalmic solution [id number] Yes Place 1 drop into both eyes 2 (two) times daily. [provider] Taking Active Self  ezetimibe (ZETIA) 10 MG tablet [id number] Yes TAKE 1 TABLET(10 MG) BY MOUTH DAILY Nafziger, [name], NP Taking Active   ferrous sulfate 325 (65 FE) MG tablet [id number] Yes Take 65 mg by mouth daily. [provider] Taking Active   hydrALAZINE (APRESOLINE) 50 MG tablet [id number] Yes Take 50 mg by mouth 3 (three) times daily. [provider] Taking Active   Insulin Pen Needle (B-D UF III MINI PEN NEEDLES) 31G X 5 MM MISC [id number] Yes USE TO TEST BLOOD GLUCOSE FOUR TIMES DAILY Nafziger, [name], NP Taking Active   labetalol (NORMODYNE) 200 MG tablet [id number] Yes TAKE 1 TABLET(200 MG) BY MOUTH THREE TIMES DAILY Nafziger, [name], NP Taking Active   LANTUS SOLOSTAR 100 UNIT/ML Solostar Pen [id number] Yes INJECT 7-8 UNITS INTO THE SKIN EVERY NIGHT AT BEDTIME Nafziger, [name], NP Taking Active   nateglinide (STARLIX) 60 MG tablet [id number] Yes TAKE 1 TABLET BY MOUTH ONCE A DAY WITH MEALS [provider] Taking Active   NON FORMULARY [id number] Yes  [provider] Taking Active   ONETOUCH VERIO test strip [id number] Yes  TEST TWICE DAILY Nafziger, [name], NP Taking Active   valsartan (DIOVAN) 160 MG tablet [id number] Yes TK 1 T PO QD [provider] Taking Active   VITAMIN D PO [id number] Yes Take 2,000 Units by mouth. [provider] Taking Active           Patient Active Problem List   Diagnosis Date Noted  . Drug-induced myopathy 01/08/2020  . Iron deficiency anemia 04/03/2019  . Anemia of chronic renal failure, stage 4 (severe) ([place]) 04/03/2019  . Late effect of cerebrovascular accident (CVA) 12/07/2017  . Hypoglycemia   . Uncontrolled hypertension   . Hypokalemia   . Acute ischemic left MCA stroke ([place]) 07/12/2017  . Diabetes mellitus type 2 in obese ([place])   . Stage 3 chronic kidney disease ([place])   . Left middle cerebral artery stroke ([place]) 07/09/2017  . Acute embolic stroke ([place])   . Aphasia 07/08/2017  . Gout 07/21/2011  . Prostatitis, chronic 07/14/2010  . Hyperlipidemia 06/18/2007  . Hypertensive kidney disease 01/02/2007    Immunization History  Administered Date(s) Administered  . Influenza, High Dose Seasonal PF 06/19/2018  . PFIZER(Purple Top)SARS-COV-2 Vaccination 01/18/2020, 02/08/2020  . Pneumococcal Conjugate-13 01/03/2019  . Pneumococcal Polysaccharide-23 12/20/2011  . Td 11/13/2009    Conditions to be addressed/monitored:  Hypertension, Hyperlipidemia, Diabetes, Chronic Kidney Disease, Gout and glaucoma, anemia, history of stroke  There are no care plans that you recently modified to display for this patient.    Medication Assistance: None required.  Patient affirms current coverage meets needs.  Patient's preferred pharmacy is:  [facility] [place], [address] [place] [address] Phone: [phone] Fax: [phone]  Uses pill box? No - basket  on the counter Pt endorses 100% compliance  We discussed: Current pharmacy is preferred with insurance plan and  patient is satisfied with pharmacy services Patient  to: Continue current medication management strategy  Care Plan and Follow Up Patient Decision:  Patient agrees to Care Plan and Follow-up.  Plan: Telephone follow up appointment with care management team member scheduled for:  3 months  Jeni Salles, PharmD [place] Pharmacist [place] [facility] [place] [phone]

## 2020-10-12 ENCOUNTER — Ambulatory Visit (INDEPENDENT_AMBULATORY_CARE_PROVIDER_SITE_OTHER): Payer: Medicare Other

## 2020-10-12 DIAGNOSIS — I63512 Cerebral infarction due to unspecified occlusion or stenosis of left middle cerebral artery: Secondary | ICD-10-CM | POA: Diagnosis not present

## 2020-10-12 NOTE — Patient Instructions (Addendum)
Hi [name] [name],  It was great to get to meet you and your wife in person! Below is a summary of some of the topics we discussed. Keep checking your blood pressure and blood sugars at home and I will reach out to you once I hear back about any medication changes!  Please reach out to me if you have any questions or need anything before our follow up!  Best, Maddie  Jeni Salles, PharmD, [facility]  Visit Information  Goals Addressed   None    Patient Care Plan: CCM Pharmacy Care Plan    Problem Identified: Problem: Hypertension, Hyperlipidemia, Diabetes, Chronic Kidney Disease, Gout and glaucoma, anemia, history of stroke     Long-Range Goal: Patient-Specific Goal   Start Date: 09/30/2020  Expected End Date: 09/30/2021  This Visit's Progress: On track  Priority: High  Note:   Current Barriers:  . Unable to achieve control of blood pressure and diabetes   Pharmacist Clinical Goal(s):  Marland Kitchen Patient will achieve control of blood pressure as evidenced by home blood pressure readings . maintain control of diabetes as evidenced by A1c and blood sugars  through collaboration with PharmD and provider.    Interventions: . 1:1 collaboration with Dorothyann Peng, NP regarding development and update of comprehensive plan of care as evidenced by provider attestation and co-signature . Inter-disciplinary care team collaboration (see longitudinal plan of care) . Comprehensive medication review performed; medication list updated in electronic medical record  Hypertension (BP goal <140/90) -Uncontrolled -Current treatment: . Amlodipine 5 mg 1 tablet twice daily . Hydralazine 50 mg 1 tablet three times daily . Labetalol 200 mg 1 tablet three times daily . Valsartan 160 mg 1/2 tablet twice daily -Medications previously tried: benazepril atenolol-chlorthalidone  -Current home readings: 160/86, 151/77, 148/81, 151/75, 178/88, 140/71,  143/81/ 158/77, 177/91, 150/74, 144/81 -Current dietary habits: patient is limiting salt and trying to eat cleaner since the stroke -Current exercise habits: going to the gym 4 days a week  -Denies hypotensive/hypertensive symptoms -Educated on Exercise goal of 150 minutes per week; Importance of home blood pressure monitoring; Proper BP monitoring technique; -Counseled to monitor BP at home daily, document, and provide log at future appointments -Counseled on diet and exercise extensively Recommended to continue current medication  Hyperlipidemia: (LDL goal < 70) -Uncontrolled -Current treatment: . Ezetimibe 10 mg 1 tablet daily -Medications previously tried: Repatha (high BP), statins (myalgias) (pravastatin, atorvastatin, simvastatin) -Current dietary patterns: baking foods instead of frying; eating lots of fiber -Current exercise habits: going to the gym 4 days a week -Educated on Cholesterol goals;  Importance of limiting foods high in cholesterol; Exercise goal of 150 minutes per week; -Counseled on diet and exercise extensively Recommended to continue current medication  History of stroke (Goal: prevent recurrent strokes) -Controlled -Current treatment  . Aspirin 325 mg 1 tablet daily -Medications previously tried: none  -Recommended to continue current medication Counseled on monitoring for signs of bleeding such as unexplained and excessive bleeding from a cut or injury, easy or excessive bruising, blood in urine or stools, and nosebleeds without a known cause  Diabetes (A1c goal <8%) (per PCP) -Controlled -Current medications: . Lantus inject 9 units daily . Nateglinide 60 mg 1 tablet once daily with meals (needs to be 15 mins before meals) -Medications previously tried: glyburide, Novolog, Januvia -Current home glucose readings . fasting glucose: 181, 146, 148, 157, 148 . post prandial glucose: 203, 207, 190, 205, 170 -Denies hypoglycemic/hyperglycemic  symptoms -Current  meal patterns:  . breakfast: did not discuss  . lunch: did not discuss   . dinner: did not discuss  . snacks: did not discuss  . drinks: did not discuss  -Current exercise: going to the gym 4 days a week -Educated on Exercise goal of 150 minutes per week; Benefits of routine self-monitoring of blood sugar; Carbohydrate counting and/or plate method -Counseled to check feet daily and get yearly eye exams -Counseled on diet and exercise extensively Recommended to continue current medication Counseled on possible benefits of SGTL2 therapy with CKD benefits  Anemia (Goal: Hgb > 11) -Controlled -Current treatment  . Ferrous sulfate 325 mg 1 tablet daily -Medications previously tried: none  -Recommended to continue current medication Counseled on taking with food to prevent upset stomach  Chronic kidney disease (Goal: prevent worsening of kidney function) -Controlled -Current treatment  . No medications -Medications previously tried: none  -Counseled on diet and exercise extensively  Glaucoma (Goal: lower intraocular pressure) -Controlled -Current treatment  . Alphagan 0.2% daily . Cosopt 22.3-6.8 daily -Medications previously tried: none  -Recommended to continue current medication    Health Maintenance -Vaccine gaps: tetanus, shingles -Current therapy:  Marland Kitchen Vitamin D 2000 units daily . Vitamin B complex daily -Educated on Cost vs benefit of each product must be carefully weighed by individual consumer -Patient is satisfied with current therapy and denies issues -Recommended to continue current medication  Patient Goals/Self-Care Activities . Patient will:  - take medications as prescribed check glucose daily, document, and provide at future appointments check blood pressure a few times daily, document, and provide at future appointments  Follow Up Plan: Telephone follow up appointment with care management team member scheduled for: 3 months       [name] was given information about Chronic Care Management services today including:  1. CCM service includes personalized support from designated clinical staff supervised by his physician, including individualized plan of care and coordination with other care providers 2. 24/7 contact phone numbers for assistance for urgent and routine care needs. 3. Standard insurance, coinsurance, copays and deductibles apply for chronic care management only during months in which we provide at least 20 minutes of these services. Most insurances cover these services at 100%, however patients may be responsible for any copay, coinsurance and/or deductible if applicable. This service may help you avoid the need for more expensive face-to-face services. 4. Only one practitioner may furnish and bill the service in a calendar month. 5. The patient may stop CCM services at any time (effective at the end of the month) by phone call to the office staff.  Patient agreed to services and verbal consent obtained.   The patient verbalized understanding of instructions, educational materials, and care plan provided today and agreed to receive a mailed copy of patient instructions, educational materials, and care plan.  Telephone follow up appointment with pharmacy team member scheduled for:  Viona Gilmore, [facility]  High Cholesterol  High cholesterol is a condition in which the blood has high levels of a white, waxy substance similar to fat (cholesterol). The liver makes all the cholesterol that the body needs. The human body needs small amounts of cholesterol to help build cells. A person gets extra or excess cholesterol from the food that he or she eats. The blood carries cholesterol from the liver to the rest of the body. If you have high cholesterol, deposits (plaques) may build up on the walls of your arteries. Arteries are the blood vessels that carry  blood away from your heart. These plaques make the arteries  narrow and stiff. Cholesterol plaques increase your risk for heart attack and stroke. Work with your health care provider to keep your cholesterol levels in a healthy range. What increases the risk? The following factors may make you more likely to develop this condition:  Eating foods that are high in animal fat (saturated fat) or cholesterol.  Being overweight.  Not getting enough exercise.  A family history of high cholesterol (familial hypercholesterolemia).  Use of tobacco products.  Having diabetes. What are the signs or symptoms? There are no symptoms of this condition. How is this diagnosed? This condition may be diagnosed based on the results of a blood test.  If you are older than 74 years of age, your health care provider may check your cholesterol levels every 4-6 years.  You may be checked more often if you have high cholesterol or other risk factors for heart disease. The blood test for cholesterol measures:  "Bad" cholesterol, or LDL cholesterol. This is the main type of cholesterol that causes heart disease. The desired level is less than 100 mg/dL.  "Good" cholesterol, or HDL cholesterol. HDL helps protect against heart disease by cleaning the arteries and carrying the LDL to the liver for processing. The desired level for HDL is 60 mg/dL or higher.  Triglycerides. These are fats that your body can store or burn for energy. The desired level is less than 150 mg/dL.  Total cholesterol. This measures the total amount of cholesterol in your blood and includes LDL, HDL, and triglycerides. The desired level is less than 200 mg/dL. How is this treated? This condition may be treated with:  Diet changes. You may be asked to eat foods that have more fiber and less saturated fats or added sugar.  Lifestyle changes. These may include regular exercise, maintaining a healthy weight, and quitting use of tobacco products.  Medicines. These are given when diet and lifestyle  changes have not worked. You may be prescribed a statin medicine to help lower your cholesterol levels. Follow these instructions at home: Eating and drinking  Eat a healthy, balanced diet. This diet includes: ? Daily servings of a variety of fresh, frozen, or canned fruits and vegetables. ? Daily servings of whole grain foods that are rich in fiber. ? Foods that are low in saturated fats and trans fats. These include poultry and fish without skin, lean cuts of meat, and low-fat dairy products. ? A variety of fish, especially oily fish that contain omega-3 fatty acids. Aim to eat fish at least 2 times a week.  Avoid foods and drinks that have added sugar.  Use healthy cooking methods, such as roasting, grilling, broiling, baking, poaching, steaming, and stir-frying. Do not fry your food except for stir-frying.   Lifestyle  Get regular exercise. Aim to exercise for a total of 150 minutes a week. Increase your activity level by doing activities such as gardening, walking, and taking the stairs.  Do not use any products that contain nicotine or tobacco, such as cigarettes, e-cigarettes, and chewing tobacco. If you need help quitting, ask your health care provider.   General instructions  Take over-the-counter and prescription medicines only as told by your health care provider.  Keep all follow-up visits as told by your health care provider. This is important. Where to find more information  American Heart Association: www.heart.org  National Heart, Lung, and Blood Institute: https://wilson-eaton.com/ Contact a health care provider if:  You have  trouble achieving or maintaining a healthy diet or weight.  You are starting an exercise program.  You are unable to stop smoking. Get help right away if:  You have chest pain.  You have trouble breathing.  You have any symptoms of a stroke. "BE FAST" is an easy way to remember the main warning signs of a stroke: ? B - Balance. Signs are  dizziness, sudden trouble walking, or loss of balance. ? E - Eyes. Signs are trouble seeing or a sudden change in vision. ? F - Face. Signs are sudden weakness or numbness of the face, or the face or eyelid drooping on one side. ? A - Arms. Signs are weakness or numbness in an arm. This happens suddenly and usually on one side of the body. ? S - Speech. Signs are sudden trouble speaking, slurred speech, or trouble understanding what people say. ? T - Time. Time to call emergency services. Write down what time symptoms started.  You have other signs of a stroke, such as: ? A sudden, severe headache with no known cause. ? Nausea or vomiting. ? Seizure. These symptoms may represent a serious problem that is an emergency. Do not wait to see if the symptoms will go away. Get medical help right away. Call your local emergency services (911 in the [place] Do not drive yourself to the hospital. Summary  Cholesterol plaques increase your risk for heart attack and stroke. Work with your health care provider to keep your cholesterol levels in a healthy range.  Eat a healthy, balanced diet, get regular exercise, and maintain a healthy weight.  Do not use any products that contain nicotine or tobacco, such as cigarettes, e-cigarettes, and chewing tobacco.  Get help right away if you have any symptoms of a stroke. This information is not intended to replace advice given to you by your health care provider. Make sure you discuss any questions you have with your health care provider. Document Revised: 05/20/2019 Document Reviewed: 05/20/2019 Elsevier Patient Education  2021 [employer].

## 2020-10-16 ENCOUNTER — Telehealth: Payer: Self-pay | Admitting: Pharmacist

## 2020-10-16 NOTE — Telephone Encounter (Signed)
Spoke with patient's wife regarding blood sugars and recommendations per PCP from CCM visit.  PCP was ok with increasing Lantus by 3 units due to blood sugars being in the 200s. Since CCM visit, patient's wife reports that numbers have come down some and morning readings are in the 130-140s and evening ranges from 130-278. Opted to not increase Lantus dose for right now and will plan to check back in in a few weeks to check on blood sugar readings.  Patient's wife also reports that blood pressures have come down significantly from CCM visit. Patient is not typically having readings in the 130-140s/80s and as low as 128 for the SBP.  Will follow up in 2-3 weeks for further blood sugar and blood pressure readings.

## 2020-10-20 ENCOUNTER — Telehealth: Payer: Self-pay | Admitting: Adult Health

## 2020-10-20 ENCOUNTER — Other Ambulatory Visit: Payer: Self-pay | Admitting: Adult Health

## 2020-10-20 DIAGNOSIS — I739 Peripheral vascular disease, unspecified: Secondary | ICD-10-CM

## 2020-10-20 NOTE — Telephone Encounter (Signed)
Spoke with the pts wife and she wanted to let [name] [name] know the recent glucose AM and PM readings below: 4/12--132---153 4/13-133---132 4/14-143---205 4/15-164---166 4/16-144---179 4/17-135---195 4/18-156---168 4/19-184  Also stated the nephrologist (who only has offices near [facility]) requested a referral to a local vein and vascular specialist instead of being seen by one in [place] as he did not feel comfortable with the pt having to travel far away.  Message sent to PCP.

## 2020-10-20 NOTE — Telephone Encounter (Signed)
Pt wife call and stated she need a call back to talk about his blood sugar level.

## 2020-10-21 NOTE — Telephone Encounter (Signed)
Referral placed  Have him start taking 2 units of his lantus in the morning and keep with this current dose in the evening. This should help lower his nighttime blood sugars

## 2020-10-22 NOTE — Telephone Encounter (Signed)
Spoke with the pts wife and informed her of the message below. 

## 2020-10-23 NOTE — Progress Notes (Signed)
[facility] Summary Report / Loop Recorder 

## 2020-10-27 ENCOUNTER — Other Ambulatory Visit: Payer: Self-pay | Admitting: Adult Health

## 2020-10-27 DIAGNOSIS — I639 Cerebral infarction, unspecified: Secondary | ICD-10-CM

## 2020-10-27 DIAGNOSIS — E1169 Type 2 diabetes mellitus with other specified complication: Secondary | ICD-10-CM

## 2020-11-01 ENCOUNTER — Other Ambulatory Visit: Payer: Self-pay | Admitting: Adult Health

## 2020-11-01 DIAGNOSIS — E119 Type 2 diabetes mellitus without complications: Secondary | ICD-10-CM

## 2020-11-09 ENCOUNTER — Telehealth: Payer: Self-pay | Admitting: Pharmacist

## 2020-11-09 NOTE — Chronic Care Management (AMB) (Signed)
Chronic Care Management Pharmacy Assistant   Name: [name] [name]  MRN: [id number] DOB: September 14, 1945  Reason for Encounter: Disease State   Conditions to be addressed/monitored: HTN and DMII  Recent office visits:  04.09.2022- Phone Call - Increase  Lantus by  2 units in the mornings  Recent consult visits:  None  Hospital visits:  None in previous 6 months  Medications: Outpatient Encounter Medications as of 11/09/2020  Medication Sig  . amLODipine (NORVASC) 5 MG tablet Take 5 mg by mouth in the morning and at bedtime.  Marland Kitchen aspirin 325 MG tablet Take 325 mg by mouth daily.  . B Complex Vitamins (VITAMIN B COMPLEX) TABS Take by mouth.  . Blood Glucose Monitoring Suppl (ONETOUCH VERIO) w/Device KIT Use with existing test strips  . brimonidine (ALPHAGAN) 0.2 % ophthalmic solution Place 1 drop into both eyes 2 (two) times daily.  . calcitRIOL (ROCALTROL) 0.25 MCG capsule Take 3 capsules by mouth daily.  . Cholecalciferol 50 MCG (2000 UT) CAPS Take 1 capsule by mouth daily.  . dorzolamide-timolol (COSOPT) 22.3-6.8 MG/ML ophthalmic solution Place 1 drop into both eyes 2 (two) times daily.  Marland Kitchen ezetimibe (ZETIA) 10 MG tablet TAKE 1 TABLET(10 MG) BY MOUTH DAILY  . ferrous sulfate 325 (65 FE) MG tablet Take 65 mg by mouth daily.  . hydrALAZINE (APRESOLINE) 50 MG tablet Take 50 mg by mouth 3 (three) times daily.  . Insulin Pen Needle (B-D UF III MINI PEN NEEDLES) 31G X 5 MM MISC USE TO TEST BLOOD GLUCOSE FOUR TIMES DAILY  . labetalol (NORMODYNE) 200 MG tablet TAKE 1 TABLET(200 MG) BY MOUTH THREE TIMES DAILY  . LANTUS SOLOSTAR 100 UNIT/ML Solostar Pen ADMINISTER 7 TO 8 UNITS UNDER THE SKIN EVERY NIGHT AT BEDTIME  . nateglinide (STARLIX) 60 MG tablet TAKE 1 TABLET BY MOUTH ONCE A DAY WITH MEALS  . ONETOUCH VERIO test strip TEST TWICE DAILY  . valsartan (DIOVAN) 160 MG tablet Take 80 mg by mouth 2 (two) times daily.   No facility-administered encounter medications on file as of 11/09/2020.    Recent Relevant Labs: Lab Results  Component Value Date/Time   HGBA1C 7.2 (A) 09/01/2020 09:45 AM   HGBA1C 6.2 (H) 01/15/2020 08:11 AM   HGBA1C 5.4 05/17/2019 10:40 AM   HGBA1C 6.5 06/19/2018 08:43 AM   HGBA1C 5.5 03/21/2018 07:14 AM   MICROALBUR 2.2 (H) 02/13/2014 09:53 AM   MICROALBUR 2.4 (H) 09/30/2013 09:50 AM    Kidney Function Lab Results  Component Value Date/Time   CREATININE 2.45 (H) 09/01/2020 07:08 AM   CREATININE 2.80 (H) 08/07/2020 08:53 AM   CREATININE 2.52 (H) 01/15/2020 08:11 AM   CREATININE 2.4 (A) 09/04/2019 12:00 AM   CREATININE 2.4 (A) 05/24/2019 12:00 AM   CREATININE 2.56 (H) 05/17/2019 10:40 AM   CREATININE 2.77 (H) 04/03/2019 11:06 AM   GFR 25.43 (L) 09/01/2020 07:08 AM   GFRNONAA 23 (L) 08/07/2020 08:53 AM   GFRAA 30 05/24/2019 12:00 AM   GFRAA 25 (L) 04/03/2019 11:06 AM   . Current antihyperglycemic regimen:  o Lantus inject 11 units daily o Nateglinide 60 mg 1 tablet once daily with meals (needs to be 15 mins before meals) . What recent interventions/DTPs have been made to improve glycemic control:  o 2 units of Lantus add in the morning . Have there been any recent hospitalizations or ED visits since last visit with CPP? No . Patient denies hypoglycemic symptoms, including Pale, Sweaty, Shaky, Hungry, Nervous/irritable and Vision changes .  Patient reports hyperglycemic symptoms, including fatigue and Craving sweets . How often are you checking your blood sugar? twice daily . What are your blood sugars ranging?  o Fasting:  - 05.06 131 - 05.07 143 - 05.08 151  - 05.09 131 o Before meals: N/A o After meals: N/A o Bedtime: N/A . During the week, how often does your blood glucose drop below 70? Never . Are you checking your feet daily/regularly? Yes  Adherence Review: Is the patient currently on a STATIN medication? No Is the patient currently on ACE/ARB medication? Yes Does the patient have >5 day gap between last estimated fill dates?  No  Reviewed chart prior to disease state call. Spoke with patient regarding BP  Recent Office Vitals: BP Readings from Last 3 Encounters:  09/22/20 140/80  09/01/20 (!) 146/78  08/07/20 (!) 177/78   Pulse Readings from Last 3 Encounters:  09/22/20 (!) 52  09/01/20 64  08/07/20 62    Wt Readings from Last 3 Encounters:  09/22/20 227 lb 6.4 oz (103.1 kg)  09/01/20 229 lb 6.4 oz (104.1 kg)  08/07/20 227 lb 9.6 oz (103.2 kg)     Kidney Function Lab Results  Component Value Date/Time   CREATININE 2.45 (H) 09/01/2020 07:08 AM   CREATININE 2.80 (H) 08/07/2020 08:53 AM   CREATININE 2.52 (H) 01/15/2020 08:11 AM   CREATININE 2.4 (A) 09/04/2019 12:00 AM   CREATININE 2.4 (A) 05/24/2019 12:00 AM   CREATININE 2.56 (H) 05/17/2019 10:40 AM   CREATININE 2.77 (H) 04/03/2019 11:06 AM   GFR 25.43 (L) 09/01/2020 07:08 AM   GFRNONAA 23 (L) 08/07/2020 08:53 AM   GFRAA 30 05/24/2019 12:00 AM   GFRAA 25 (L) 04/03/2019 11:06 AM    BMP Latest Ref Rng & Units 09/01/2020 08/07/2020 01/15/2020  Glucose 70 - 99 mg/dL 143(H) 218(H) 118(H)  BUN 6 - 23 mg/dL 31(H) 45(H) 43(H)  Creatinine 0.40 - 1.50 mg/dL 2.45(H) 2.80(H) 2.52(H)  BUN/Creat Ratio 6 - 22 (calc) - - 17  Sodium 135 - 145 mEq/L 142 141 140  Potassium 3.5 - 5.1 mEq/L 4.1 4.7 4.1  Chloride 96 - 112 mEq/L 107 110 110  CO2 19 - 32 mEq/L $Remove'29 22 24  'FCreoux$ Calcium 8.4 - 10.5 mg/dL 9.4 9.1 9.2   . Current antihypertensive regimen:  o Amlodipine 5 mg 1 tablet twice daily o Hydralazine 50 mg 1 tablet three times daily o Labetalol 200 mg 1 tablet three times daily o Valsartan 160 mg 1/2 tablet twice daily . How often are you checking your Blood Pressure? twice daily . Current home BP readings:  o 05.06 162/84-Morning 163/76-Evening o 05.07 148/77   148/74 o 05.08 137/74   162/76 . What recent interventions/DTPs have been made by any provider to improve Blood Pressure control since last CPP Visit: None . Any recent hospitalizations or ED visits since  last visit with CPP? No . What diet changes have been made to improve Blood Pressure Control?  o Low sodium and low sugar foods . What exercise is being done to improve your Blood Pressure Control?  o Continues to go the gym  Adherence Review: Is the patient currently on ACE/ARB medication? Yes Does the patient have >5 day gap between last estimated fill dates? No   I spoke with the patient and his wife [name]) about medication adherence. She states that he has been doing fair recently. She's noticed that he has been a little more fatigued and has been craving sweets.  On  04.19.2022 he did have an increase in his Lantus Solostar 100 units and now he takes a total of 11 units daily. She continues to check his blood sugars and blood pressure readings daily. No other changes to his medications. He continues to go to the gym at least four days a week. He did not report other side effects from his current medications. There have been no emergency department or urgent care visits since his last PCP or CPP visit. He does not have any issues with his current pharmacy.  Star Rating Drugs:  Dispensed Quantity Pharmacy  Nateglinide 60 mg 04.23.2022 30 [facility]  Valsartan 160 mg 04.23.2022 30 [facility]   [facility] [place], [place] [phone]

## 2020-11-13 ENCOUNTER — Other Ambulatory Visit: Payer: Self-pay | Admitting: Adult Health

## 2020-11-13 DIAGNOSIS — I739 Peripheral vascular disease, unspecified: Secondary | ICD-10-CM

## 2020-11-16 ENCOUNTER — Ambulatory Visit (INDEPENDENT_AMBULATORY_CARE_PROVIDER_SITE_OTHER): Payer: Medicare Other

## 2020-11-16 ENCOUNTER — Other Ambulatory Visit: Payer: Medicare Other

## 2020-11-16 DIAGNOSIS — I63512 Cerebral infarction due to unspecified occlusion or stenosis of left middle cerebral artery: Secondary | ICD-10-CM | POA: Diagnosis not present

## 2020-11-17 LAB — CUP PACEART REMOTE DEVICE CHECK
Date Time Interrogation Session: 20220514231121
Implantable Pulse Generator Implant Date: 20190108

## 2020-11-23 DIAGNOSIS — H0102B Squamous blepharitis left eye, upper and lower eyelids: Secondary | ICD-10-CM | POA: Diagnosis not present

## 2020-11-23 DIAGNOSIS — H40023 Open angle with borderline findings, high risk, bilateral: Secondary | ICD-10-CM | POA: Diagnosis not present

## 2020-11-23 DIAGNOSIS — H2513 Age-related nuclear cataract, bilateral: Secondary | ICD-10-CM | POA: Diagnosis not present

## 2020-11-23 DIAGNOSIS — H34212 Partial retinal artery occlusion, left eye: Secondary | ICD-10-CM | POA: Diagnosis not present

## 2020-11-23 DIAGNOSIS — H353131 Nonexudative age-related macular degeneration, bilateral, early dry stage: Secondary | ICD-10-CM | POA: Diagnosis not present

## 2020-11-23 DIAGNOSIS — H35033 Hypertensive retinopathy, bilateral: Secondary | ICD-10-CM | POA: Diagnosis not present

## 2020-11-23 DIAGNOSIS — H0102A Squamous blepharitis right eye, upper and lower eyelids: Secondary | ICD-10-CM | POA: Diagnosis not present

## 2020-12-08 NOTE — Progress Notes (Signed)
[facility] Summary Report / Loop Recorder 

## 2020-12-14 ENCOUNTER — Telehealth: Payer: Self-pay | Admitting: Pharmacist

## 2020-12-14 NOTE — Chronic Care Management (AMB) (Signed)
Chronic Care Management Pharmacy Assistant   Name: [name] [name]  MRN: [id number] DOB: 09-13-1945  Reason for Encounter: Disease State   Conditions to be addressed/monitored: HTN and DMII  Recent office visits:  None.   Recent consult visits:  None.   Hospital visits:  None in previous 6 months  Medications: Outpatient Encounter Medications as of 12/14/2020  Medication Sig   amLODipine (NORVASC) 5 MG tablet Take 5 mg by mouth in the morning and at bedtime.   aspirin 325 MG tablet Take 325 mg by mouth daily.   B Complex Vitamins (VITAMIN B COMPLEX) TABS Take by mouth.   Blood Glucose Monitoring Suppl (ONETOUCH VERIO) w/Device KIT Use with existing test strips   brimonidine (ALPHAGAN) 0.2 % ophthalmic solution Place 1 drop into both eyes 2 (two) times daily.   calcitRIOL (ROCALTROL) 0.25 MCG capsule Take 3 capsules by mouth daily.   Cholecalciferol 50 MCG (2000 UT) CAPS Take 1 capsule by mouth daily.   dorzolamide-timolol (COSOPT) 22.3-6.8 MG/ML ophthalmic solution Place 1 drop into both eyes 2 (two) times daily.   ezetimibe (ZETIA) 10 MG tablet TAKE 1 TABLET(10 MG) BY MOUTH DAILY   ferrous sulfate 325 (65 FE) MG tablet Take 65 mg by mouth daily.   hydrALAZINE (APRESOLINE) 50 MG tablet Take 50 mg by mouth 3 (three) times daily.   Insulin Pen Needle (B-D UF III MINI PEN NEEDLES) 31G X 5 MM MISC USE TO TEST BLOOD GLUCOSE FOUR TIMES DAILY   labetalol (NORMODYNE) 200 MG tablet TAKE 1 TABLET(200 MG) BY MOUTH THREE TIMES DAILY   LANTUS SOLOSTAR 100 UNIT/ML Solostar Pen ADMINISTER 7 TO 8 UNITS UNDER THE SKIN EVERY NIGHT AT BEDTIME   nateglinide (STARLIX) 60 MG tablet TAKE 1 TABLET BY MOUTH ONCE A DAY WITH MEALS   ONETOUCH VERIO test strip TEST TWICE DAILY AS DIRECTED   valsartan (DIOVAN) 160 MG tablet Take 80 mg by mouth 2 (two) times daily.   No facility-administered encounter medications on file as of 12/14/2020.    Reviewed chart prior to disease state call. Spoke with  patient regarding BP  Recent Office Vitals: BP Readings from Last 3 Encounters:  09/22/20 140/80  09/01/20 (!) 146/78  08/07/20 (!) 177/78   Pulse Readings from Last 3 Encounters:  09/22/20 (!) 52  09/01/20 64  08/07/20 62    Wt Readings from Last 3 Encounters:  09/22/20 227 lb 6.4 oz (103.1 kg)  09/01/20 229 lb 6.4 oz (104.1 kg)  08/07/20 227 lb 9.6 oz (103.2 kg)     Kidney Function Lab Results  Component Value Date/Time   CREATININE 2.45 (H) 09/01/2020 07:08 AM   CREATININE 2.80 (H) 08/07/2020 08:53 AM   CREATININE 2.52 (H) 01/15/2020 08:11 AM   CREATININE 2.4 (A) 09/04/2019 12:00 AM   CREATININE 2.4 (A) 05/24/2019 12:00 AM   CREATININE 2.56 (H) 05/17/2019 10:40 AM   CREATININE 2.77 (H) 04/03/2019 11:06 AM   GFR 25.43 (L) 09/01/2020 07:08 AM   GFRNONAA 23 (L) 08/07/2020 08:53 AM   GFRAA 30 05/24/2019 12:00 AM   GFRAA 25 (L) 04/03/2019 11:06 AM    BMP Latest Ref Rng & Units 09/01/2020 08/07/2020 01/15/2020  Glucose 70 - 99 mg/dL 109(V) 267(X) 968(P)  BUN 6 - 23 mg/dL 31(A) 08(C) 43(C)  Creatinine 0.40 - 1.50 mg/dL 5.00(M) 2.72(I) 1.01(L)  BUN/Creat Ratio 6 - 22 (calc) - - 17  Sodium 135 - 145 mEq/L 142 141 140  Potassium 3.5 - 5.1 mEq/L 4.1 4.7  4.1  Chloride 96 - 112 mEq/L 107 110 110  CO2 19 - 32 mEq/L $Remove'29 22 24  'JZDFqsq$ Calcium 8.4 - 10.5 mg/dL 9.4 9.1 9.2    Current antihypertensive regimen:  Amlodipine 5 mg 1 tablet twice daily Hydralazine 50 mg 1 tablet three times daily Labetalol 200 mg 1 tablet three times daily Valsartan 160 mg 1/2 tablet twice daily How often are you checking your Blood Pressure? twice daily Current home BP readings:  AM :                       PM: 6/6 - 139/75           140/74 6/7 - 130/73           161/77 6/8 - 142/74           135/66 6/9 - 135/70           142/63 6/10 - 126/65         149/68 6/11 - 126/68         151/68 6/12 - 125/67         164/74 6/13 - 134/72 What recent interventions/DTPs have been made by any provider to improve  Blood Pressure control since last CPP Visit: None.  Any recent hospitalizations or ED visits since last visit with CPP? No What diet changes have been made to improve Blood Pressure Control?  Spoke with [name] patients wife who states patient is on a pretty consistent diet and eating schedule. [name] states that for breakfast on sun., mon., wed., and fridays patient usually eats either cereal with a banana or applesauce and has coffee with 2 [percent milk. On tuesdays and thursdays patient has 2 boiled eggs, grits and toast with no jelly. On saturdays patient gets a big breakfast with scrambled eggs, a meat and pancakes or biscuits and gravy. [name] states that usually for lunch patient has a sandwich with the meat she cooked the night before ot low sodium deli meat. For dinner patient always has a meat and a green vegetable like broccoli or green beans and a salad. Patient drinks plenty of water and drinks tea that veronica makes with only one cup of sugar to a gallon. Patient likes to have sugar free jello for his desert.   What exercise is being done to improve your Blood Pressure Control?  Patient recently had an ulcer on his foot and hasn't been able to do much exercise or continuous walking. Ulcer has gotten better and veronica patients wife states that they plan to start back going to the club this week where patient uses elliptical.   Adherence Review: Is the patient currently on ACE/ARB medication? Yes Does the patient have >5 day gap between last estimated fill dates? No   Recent Relevant Labs: Lab Results  Component Value Date/Time   HGBA1C 7.2 (A) 09/01/2020 09:45 AM   HGBA1C 6.2 (H) 01/15/2020 08:11 AM   HGBA1C 5.4 05/17/2019 10:40 AM   HGBA1C 6.5 06/19/2018 08:43 AM   HGBA1C 5.5 03/21/2018 07:14 AM   MICROALBUR 2.2 (H) 02/13/2014 09:53 AM   MICROALBUR 2.4 (H) 09/30/2013 09:50 AM    Kidney Function Lab Results  Component Value Date/Time   CREATININE 2.45 (H) 09/01/2020  07:08 AM   CREATININE 2.80 (H) 08/07/2020 08:53 AM   CREATININE 2.52 (H) 01/15/2020 08:11 AM   CREATININE 2.4 (A) 09/04/2019 12:00 AM   CREATININE 2.4 (A) 05/24/2019 12:00 AM   CREATININE 2.56 (H) 05/17/2019  10:40 AM   CREATININE 2.77 (H) 04/03/2019 11:06 AM   GFR 25.43 (L) 09/01/2020 07:08 AM   GFRNONAA 23 (L) 08/07/2020 08:53 AM   GFRAA 30 05/24/2019 12:00 AM   GFRAA 25 (L) 04/03/2019 11:06 AM    Current antihyperglycemic regimen:  Lantus inject 2 units in the am and 7 in the pm.  Nateglinide 60 mg 1 tablet once daily with meals (needs to be 15 mins before meals) What recent interventions/DTPs have been made to improve glycemic control:  [name] changed Lantus to 2 units in the am and 7 in the pm per [name] patients wife.  Have there been any recent hospitalizations or ED visits since last visit with CPP? No Patient denies hypoglycemic symptoms, including None Patient denies hyperglycemic symptoms, including none How often are you checking your blood sugar? twice daily What are your blood sugars ranging?  AM/ Fasting     PM/ Bedtime 6/6 - 127           118 6/7 - 147           124 6/8 - 128           166 6/9 - 121           196 6/10 - 140         154 6/11 - 131         131 6/12 - 136         217 6/13 - 173          During the week, how often does your blood glucose drop below 70? Never Are you checking your feet daily/regularly? Yes. Checks feet everyday. Patient recently had an ulcer on foot.  Adherence Review: Is the patient currently on a STATIN medication? No Is the patient currently on ACE/ARB medication? Yes Does the patient have >5 day gap between last estimated fill dates? No  Notes: Spoke with veronica patients wife and went over all medications as listed. [name] reports patient taking all medications except patient takes only one calcitriol now. [name] states they check patients blood sugar and blood pressure twice daily and write them down. Patient has had an  ulcer on his foot lately and issues with his back and has not been able to much but has still done things around the house. They will be starting the club and gym back this week where patient uses the elliptical. Patient is on a pretty consistent eating schedule and diet. [name] denies and symptoms in patient of hyperglycemia or hypoglycemia.   Star Rating Drugs:  Nateglinide $RemoveBefo'60mg'[id number]$  - last filled on 10/24/20 30DS at [facility] $RemoveBefo'160mg'[id number]$  - last filled on 10/24/20 30DS at [facility] [phone]

## 2020-12-18 ENCOUNTER — Other Ambulatory Visit: Payer: Self-pay | Admitting: Adult Health

## 2020-12-21 ENCOUNTER — Ambulatory Visit (INDEPENDENT_AMBULATORY_CARE_PROVIDER_SITE_OTHER): Payer: Medicare Other

## 2020-12-21 DIAGNOSIS — I63512 Cerebral infarction due to unspecified occlusion or stenosis of left middle cerebral artery: Secondary | ICD-10-CM | POA: Diagnosis not present

## 2020-12-23 LAB — CUP PACEART REMOTE DEVICE CHECK
Date Time Interrogation Session: 20220614232852
Implantable Pulse Generator Implant Date: 20190108

## 2020-12-29 ENCOUNTER — Telehealth: Payer: Self-pay | Admitting: Pharmacist

## 2020-12-29 NOTE — Chronic Care Management (AMB) (Signed)
    Chronic Care Management Pharmacy Assistant   Name: DEBBIE BELLUCCI  MRN: [id number] DOB: [birth year]  12/29/20- Called patient to remind of appointment with Jeni Salles) on (12/30/20 at 9am by phone)   No answer, left message of appointment date, time and type of appointment (either telephone or in person). Left message to have all medications, supplements, blood pressure and/or blood sugar logs available during appointment and to return call if need to reschedule.   Star Rating Drug:  Valsartan 160mg  - last filled on 11/23/20 90DS at [facility]  Any gaps in medications fill history? NoHoover Browns CMA  Clinical Pharmacist Assistant [phone]

## 2020-12-29 NOTE — Progress Notes (Signed)
Chronic Care Management Pharmacy Note  12/30/2020 Name:  [name] [name] MRN:  [id number] DOB:  Sep 15, 1945  Summary: BP home readings have been close to goal <140/90 Blood glucose home readings  Recommendations/Changes made from today's visit: -Recommended checking blood sugars after meals to rule out any lows -Consider CGM to track blood sugars -Recommend repeat CBC, vitamin B12, vitamin D and ferritin due to patient's symptoms of fatigue  Plan: Follow up in 1 month  Subjective: [name] [name] is an 75 y.o. year old male who is a primary patient of Dorothyann Peng, NP.  The CCM team was consulted for assistance with disease management and care coordination needs.    Engaged with patient by telephone for follow up visit in response to provider referral for pharmacy case management and/or care coordination services.   Consent to Services:  The patient was given information about Chronic Care Management services, agreed to services, and gave verbal consent prior to initiation of services.  Please see initial visit note for detailed documentation.   Patient Care Team: Dorothyann Peng, NP as PCP - General (Family Medicine) Adegoroye, Wynona Luna, MD (Nephrology) Viona Gilmore, [facility] as Pharmacist (Pharmacist)  Recent office visits: 10/20/20 Telephone encounter: PCP increased Lantus to 2 units in the morning and remain on current nighttime regimen.   09/22/20 Dorothyann Peng, NP: Patient presented for DM and HTN follow up. Increased Lantus from 7 units to 9 units.  09/01/20 Dorothyann Peng, NP: Patient presented for DM follow up. A1c was elevated at 7.2%.   08/13/20 Ofilia Neas, LPN: Patient presented for AWV.  Recent consult visits: 09/25/20 Gifford Shave, MD (nephrology): Patient presented for CKD. Increased amlodipine to 5 mg BID.  09/07/20 Roma Kayser, RN (cardiology): Patient presented for remote pacemaker device check.  08/07/20 Sullivan Lone, MD (oncology): Patient  presented for iron deficiency anemia. Follow up PRN.  05/26/20 Thurston Hole (optometry): Patient presented for follow up. Unable to access notes.  05/15/20 Gifford Shave, MD (nephrology): Patient presented for CKD.  05/04/20 Rosemary Holms (podiatry): Patient presented for PVD and bunion history. Unable to access notes.    Hospital visits: None in previous 6 months  Objective:  Lab Results  Component Value Date   CREATININE 2.45 (H) 09/01/2020   BUN 31 (H) 09/01/2020   GFR 25.43 (L) 09/01/2020   GFRNONAA 23 (L) 08/07/2020   GFRAA 30 05/24/2019   NA 142 09/01/2020   K 4.1 09/01/2020   CALCIUM 9.4 09/01/2020   CO2 29 09/01/2020   GLUCOSE 143 (H) 09/01/2020    Lab Results  Component Value Date/Time   HGBA1C 7.2 (A) 09/01/2020 09:45 AM   HGBA1C 6.2 (H) 01/15/2020 08:11 AM   HGBA1C 5.4 05/17/2019 10:40 AM   HGBA1C 6.5 06/19/2018 08:43 AM   HGBA1C 5.5 03/21/2018 07:14 AM   GFR 25.43 (L) 09/01/2020 07:08 AM   GFR 30.00 (L) 05/17/2019 10:40 AM   MICROALBUR 2.2 (H) 02/13/2014 09:53 AM   MICROALBUR 2.4 (H) 09/30/2013 09:50 AM    Last diabetic Eye exam:  Lab Results  Component Value Date/Time   HMDIABEYEEXA No Retinopathy 07/04/2013 12:00 AM    Last diabetic Foot exam:  Lab Results  Component Value Date/Time   HMDIABFOOTEX done 09/30/2013 12:00 AM     Lab Results  Component Value Date   CHOL 163 01/15/2020   HDL 46 01/15/2020   LDLCALC 98 01/15/2020   LDLDIRECT 180.7 06/24/2013   TRIG 98 01/15/2020   CHOLHDL 3.5 01/15/2020    Hepatic  Function Latest Ref Rng & Units 08/07/2020 01/15/2020 09/04/2019  Total Protein 6.5 - 8.1 g/dL 6.9 6.6 -  Albumin 3.5 - 5.0 g/dL 3.9 - 4.3  AST 15 - 41 U/L 14(L) 15 13(A)  ALT 0 - 44 U/L $Remo'14 13 14  'QMgEg$ Alk Phosphatase 38 - 126 U/L 58 - 66  Total Bilirubin 0.3 - 1.2 mg/dL 0.4 0.4 -  Bilirubin, Direct 0.0 - 0.3 mg/dL - - -    Lab Results  Component Value Date/Time   TSH 2.83 01/15/2020 08:11 AM   TSH 2.12 01/03/2019 10:09 AM     CBC Latest Ref Rng & Units 09/01/2020 08/07/2020 02/06/2020  WBC 4.0 - 10.5 K/uL 6.9 7.2 6.9  Hemoglobin 13.0 - 17.0 g/dL 13.0 12.0(L) 11.1(L)  Hematocrit 39.0 - 52.0 % 38.6(L) 35.8(L) 33.4(L)  Platelets 150.0 - 400.0 K/uL 221.0 217 238    Lab Results  Component Value Date/Time   VD25OH 45 09/04/2019 12:00 AM   VD25OH 50.29 11/02/2018 08:16 AM   VD25OH 46 09/18/2018 12:00 AM   VD25OH 12.19 (L) 09/19/2017 10:00 AM    Clinical ASCVD: Yes  The ASCVD Risk score Mikey Bussing DC Jr., et al., 2013) failed to calculate for the following reasons:   The patient has a prior MI or stroke diagnosis    Depression screen [facility] 2/9 08/13/2020 01/08/2020 01/08/2020  Decreased Interest 0 0 0  Down, Depressed, Hopeless 0 0 0  PHQ - 2 Score 0 0 0  Altered sleeping - - -  Tired, decreased energy - - -  Change in appetite - - -  Feeling bad or failure about yourself  - - -  Trouble concentrating - - -  Moving slowly or fidgety/restless - - -  Suicidal thoughts - - -  PHQ-9 Score - - -  Difficult doing work/chores - - -  Some recent data might be hidden      Social History   Tobacco Use  Smoking Status Never  Smokeless Tobacco Never   BP Readings from Last 3 Encounters:  09/22/20 140/80  09/01/20 (!) 146/78  08/07/20 (!) 177/78   Pulse Readings from Last 3 Encounters:  09/22/20 (!) 52  09/01/20 64  08/07/20 62   Wt Readings from Last 3 Encounters:  09/22/20 227 lb 6.4 oz (103.1 kg)  09/01/20 229 lb 6.4 oz (104.1 kg)  08/07/20 227 lb 9.6 oz (103.2 kg)   BMI Readings from Last 3 Encounters:  09/22/20 29.20 kg/m  09/01/20 29.45 kg/m  08/07/20 29.22 kg/m    Assessment/Interventions: Review of patient past medical history, allergies, medications, health status, including review of consultants reports, laboratory and other test data, was performed as part of comprehensive evaluation and provision of chronic care management services.   SDOH:  (Social Determinants of Health) assessments and  interventions performed: Yes   SDOH Screenings   Alcohol Screen: Low Risk    Last Alcohol Screening Score (AUDIT): 0  Depression (PHQ2-9): Low Risk    PHQ-2 Score: 0  Financial Resource Strain: Low Risk    Difficulty of Paying Living Expenses: Not hard at all  Food Insecurity: No Food Insecurity   Worried About Charity fundraiser in the Last Year: Never true   Ran Out of Food in the Last Year: Never true  Housing: Low Risk    Last Housing Risk Score: 0  Physical Activity: Inactive   Days of Exercise per Week: 0 days   Minutes of Exercise per Session: 0 min  Social Connections:  Moderately Integrated   Frequency of Communication with Friends and Family: More than three times a week   Frequency of Social Gatherings with Friends and Family: Once a week   Attends Religious Services: Never   Marine scientist or Organizations: Yes   Attends Archivist Meetings: Never   Marital Status: Married  Stress: No Stress Concern Present   Feeling of Stress : Not at all  Tobacco Use: Low Risk    Smoking Tobacco Use: Never   Smokeless Tobacco Use: Never  Transportation Needs: No Transportation Needs   Lack of Transportation (Medical): No   Lack of Transportation (Non-Medical): No   Patient and his wife presented to the visit. His biggest complaint right now is his life being on hold with his toe problems. He experienced a lot of bleeding with one of his toes and did not report pain but also admits to having neuropathy. He was working out for an hour and a half about 4 days a week at the gym but stopped with his toe problems.  Patient reports he has been trying to eat cleaner since the stroke. He does not eat many sweets, eats lean meat, raw veggies, steamed veggies, and not many high carb foods. He typically bakes meat and uses herbs other than salt and only has dessert on Sundays. He drinks water and measures his intake and also drinks tea with little sugar.  Patient denies any  trouble falling asleep and usually sleeps from 12am-7am. He does take naps during the day and sometimes feels tired during the day but not consistently.  Patient reports that he does not feel like his medications are helping with his blood pressure and blood sugars. His wife helps him manage his medications.    CCM Care Plan  Allergies  Allergen Reactions   Evolocumab Hypertension    Other reaction(s): Other (see comments)   Bactrim [Sulfamethoxazole-Trimethoprim] Hives, Itching and Swelling   Penicillins Hives    Has patient had a PCN reaction causing immediate rash, facial/tongue/throat swelling, SOB or lightheadedness with hypotension: Unk Has patient had a PCN reaction causing severe rash involving mucus membranes or skin necrosis: Unk Has patient had a PCN reaction that required hospitalization: Unk Has patient had a PCN reaction occurring within the last 10 years: No If all of the above answers are "NO", then may proceed with Cephalosporin use. Other reaction(s): Other (see comments)   Statins     myalgia Other reaction(s): Other (see comments)   Sulfa Antibiotics     Other reaction(s): Other (see comments)   Sulfa Drugs Cross Reactors Hives, Itching and Swelling   Sulfamethoxazole-Trimethoprim Hives and Itching    Other reaction(s): Other (see comments)    Medications Reviewed Today     Reviewed by Anderson Malta, [place] (Certified Medical Assistant) on 09/22/20 at [id number]  Med List Status: <None>   Medication Order Taking? Sig Documenting Provider Last Dose Status Informant  amLODipine (NORVASC) 5 MG tablet [id number] Yes Take 5 mg by mouth daily. [provider] Taking Active   aspirin 325 MG tablet [id number] Yes Take 325 mg by mouth daily. [provider] Taking Active   B Complex Vitamins (VITAMIN B COMPLEX) TABS [id number] Yes Take by mouth. [provider] Taking Active   B Complex-C-Folic Acid TABS [id number] Yes SMARTSIG:1 Capsule(s) By Mouth  Daily [provider] Taking Active   brimonidine (ALPHAGAN) 0.2 % ophthalmic solution [id number] Yes Place 1 drop into both eyes 2 (two) times daily. [provider] Taking Active Self  calcitRIOL (ROCALTROL) 0.25 MCG capsule [id number] Yes Take 1 capsule by mouth daily.  [provider] Taking Active Multiple Informants  Cholecalciferol 50 MCG (2000 UT) CAPS [id number] Yes Take by mouth. [provider] Taking Active   dorzolamide-timolol (COSOPT) 22.3-6.8 MG/ML ophthalmic solution [id number] Yes Place 1 drop into both eyes 2 (two) times daily. [provider] Taking Active Self  ezetimibe (ZETIA) 10 MG tablet [id number] Yes TAKE 1 TABLET(10 MG) BY MOUTH DAILY Nafziger, [name], NP Taking Active   ferrous sulfate 325 (65 FE) MG tablet [id number] Yes Take 65 mg by mouth daily. [provider] Taking Active   hydrALAZINE (APRESOLINE) 50 MG tablet [id number] Yes Take 50 mg by mouth 3 (three) times daily. [provider] Taking Active   Insulin Pen Needle (B-D UF III MINI PEN NEEDLES) 31G X 5 MM MISC [id number] Yes USE TO TEST BLOOD GLUCOSE FOUR TIMES DAILY Nafziger, [name], NP Taking Active   labetalol (NORMODYNE) 200 MG tablet [id number] Yes TAKE 1 TABLET(200 MG) BY MOUTH THREE TIMES DAILY Nafziger, [name], NP Taking Active   LANTUS SOLOSTAR 100 UNIT/ML Solostar Pen [id number] Yes INJECT 7-8 UNITS INTO THE SKIN EVERY NIGHT AT BEDTIME Nafziger, [name], NP Taking Active   nateglinide (STARLIX) 60 MG tablet [id number] Yes TAKE 1 TABLET BY MOUTH ONCE A DAY WITH MEALS [provider] Taking Active   NON FORMULARY [id number] Yes  [provider] Taking Active   ONETOUCH VERIO test strip [id number] Yes TEST TWICE DAILY Nafziger, [name], NP Taking Active   valsartan (DIOVAN) 160 MG tablet [id number] Yes TK 1 T PO QD [provider] Taking Active   VITAMIN D PO [id number] Yes Take 2,000 Units by mouth. [provider] Taking Active              Patient Active Problem List   Diagnosis Date Noted   Drug-induced myopathy 01/08/2020   Iron deficiency anemia 04/03/2019   Anemia of chronic renal failure, stage 4 (severe) ([place]) 04/03/2019   Late effect of cerebrovascular accident (CVA) 12/07/2017   Hypoglycemia    Uncontrolled hypertension    Hypokalemia    Acute ischemic left MCA stroke ([place]) 07/12/2017   Diabetes mellitus type 2 in obese [facility])    Stage 3 chronic kidney disease (HCC)    Left middle cerebral artery stroke ([place]) 14/60/4799   Acute embolic stroke ([place])    Aphasia 07/08/2017   Gout 07/21/2011   Prostatitis, chronic 07/14/2010   Hyperlipidemia 06/18/2007   Hypertensive kidney disease 01/02/2007    Immunization History  Administered Date(s) Administered   Influenza, High Dose Seasonal PF 06/19/2018   PFIZER(Purple Top)SARS-COV-2 Vaccination 01/18/2020, 02/08/2020   Pneumococcal Conjugate-13 01/03/2019   Pneumococcal Polysaccharide-23 12/20/2011   Td 11/13/2009   Patient's wife reports that he has been more fatigued lately and not himself. His blood pressure and blood sugar readings are not super high or low, which was surprising to both of them. Recommended sooner follow up with PCP if he continues to feel bad.  Conditions to be addressed/monitored:  Hypertension, Hyperlipidemia, Diabetes, Chronic Kidney Disease, Gout and glaucoma, anemia, history of stroke  Care Plan : CCM Pharmacy Care Plan  Updates made by Viona Gilmore, [place] since 12/30/2020 12:00 AM     Problem: Problem: Hypertension, Hyperlipidemia, Diabetes, Chronic Kidney Disease, Gout and glaucoma, anemia, history of stroke      Long-Range Goal: Patient-Specific Goal   Start Date: 09/30/2020  Expected End Date: 09/30/2021  Recent  Progress: On track  Priority: High  Note:   Current Barriers:  Unable to achieve control of blood pressure and diabetes   Pharmacist Clinical Goal(s):  Patient will achieve control of blood pressure as  evidenced by home blood pressure readings maintain control of diabetes as evidenced by A1c and blood sugars  through collaboration with PharmD and provider.    Interventions: 1:1 collaboration with Dorothyann Peng, NP regarding development and update of comprehensive plan of care as evidenced by provider attestation and co-signature Inter-disciplinary care team collaboration (see longitudinal plan of care) Comprehensive medication review performed; medication list updated in electronic medical record  Hypertension (BP goal <140/90) -Uncontrolled -Current treatment: Amlodipine 5 mg 1 tablet twice daily Hydralazine 50 mg 1 tablet three times daily Labetalol 200 mg 1 tablet three times daily Valsartan 160 mg 1/2 tablet twice daily -Medications previously tried: benazepril atenolol-chlorthalidone  -Current home readings: 133/69, 134/68, 135/71, 148/74, 132/72, 129/65, 135/73, 149/72, 132/70, 147/72, 152/80, 137/69, 144/68, 151/76, 127/72, 129/67, 125/67, 147/70, 128/71 -Current dietary habits: patient is limiting salt and trying to eat cleaner since the stroke -Current exercise habits: not going to the gym right now; mowing wore him out -Denies hypotensive/hypertensive symptoms -Educated on Exercise goal of 150 minutes per week; Importance of home blood pressure monitoring; Proper BP monitoring technique; -Counseled to monitor BP at home daily, document, and provide log at future appointments -Counseled on diet and exercise extensively Recommended to continue current medication Recommended following up with nephrology to see if the will make adjustments.  Hyperlipidemia: (LDL goal < 70) -Uncontrolled -Current treatment: Ezetimibe 10 mg 1 tablet daily -Medications previously tried: Repatha (high BP), statins (myalgias) (pravastatin, atorvastatin, simvastatin) -Current dietary patterns: baking foods instead of frying; eating lots of fiber -Current exercise habits: not active right  now -Educated on Cholesterol goals;  Importance of limiting foods high in cholesterol; Exercise goal of 150 minutes per week; -Counseled on diet and exercise extensively Recommended to continue current medication  History of stroke (Goal: prevent recurrent strokes) -Controlled -Current treatment  Aspirin 325 mg 1 tablet daily -Medications previously tried: none  -Recommended to continue current medication Counseled on monitoring for signs of bleeding such as unexplained and excessive bleeding from a cut or injury, easy or excessive bruising, blood in urine or stools, and nosebleeds without a known cause  Diabetes (A1c goal <8%) (per PCP) -Controlled -Current medications: Lantus inject 2 units in the morning and 9 units in the evening Nateglinide 60 mg 1 tablet once daily with meals (needs to be 15 mins before meals) -Medications previously tried: glyburide, Novolog, Januvia -Current home glucose readings fasting glucose: 160, 144, 136, 188, 130, 124, 124, 177, 122, 120, 140, 115, 147, 177, 116, 122, 125, 117, 113 post prandial glucose: not checking after meals -Reports hypoglycemic/hyperglycemic symptoms -Current meal patterns:  breakfast: did not discuss  lunch: did not discuss   dinner: did not discuss  snacks: did not discuss  drinks: did not discuss  -Current exercise: not active right now -Educated on Exercise goal of 150 minutes per week; Benefits of routine self-monitoring of blood sugar; Carbohydrate counting and/or plate method -Counseled to check feet daily and get yearly eye exams -Counseled on diet and exercise extensively Recommended to continue current medication Recommended checking blood sugars after meals to make sure it is not running lower.  Anemia (Goal: Hgb > 11) -Controlled -Current treatment  Ferrous sulfate 325 mg 1 tablet daily -Medications previously tried: none  -Recommended to continue current medication Counseled on taking  with food to  prevent upset stomach  Chronic kidney disease (Goal: prevent worsening of kidney function) -Controlled -Current treatment  No medications -Medications previously tried: none  -Counseled on diet and exercise extensively  Glaucoma (Goal: lower intraocular pressure) -Controlled -Current treatment  Alphagan 0.2% daily Cosopt 22.3-6.8 daily -Medications previously tried: none  -Recommended to continue current medication   Health Maintenance -Vaccine gaps: tetanus, shingles -Current therapy:  Vitamin D 2000 units daily Vitamin B complex daily -Educated on Cost vs benefit of each product must be carefully weighed by individual consumer -Patient is satisfied with current therapy and denies issues -Recommended to continue current medication  Patient Goals/Self-Care Activities Patient will:  - take medications as prescribed check glucose daily, document, and provide at future appointments check blood pressure a few times daily, document, and provide at future appointments  Follow Up Plan: Telephone follow up appointment with care management team member scheduled for: 3 months      Medication Assistance: None required.  Patient affirms current coverage meets needs.  Compliance/Adherence/Medication fill history: Care Gaps: Shingrix, COVID booster, cologuard  Star-Rating Drugs: Valsartan $RemoveBeforeDE'160mg'[id number]$  - last filled on 11/23/20 90DS at [facility]  Patient's preferred pharmacy is:  [facility], [address] AT [place] [address] [place] [address] Phone: [phone] Fax: [phone]  Uses pill box? No - basket on the counter Pt endorses 100% compliance  We discussed: Current pharmacy is preferred with insurance plan and patient is satisfied with pharmacy services Patient  to: Continue current medication management strategy  Care Plan and Follow Up Patient Decision:  Patient agrees to Care Plan and  Follow-up.  Plan: Telephone follow up appointment with care management team member scheduled for:  3 months  Jeni Salles, PharmD [place] Pharmacist [place] at [place] [phone]

## 2020-12-30 ENCOUNTER — Ambulatory Visit (INDEPENDENT_AMBULATORY_CARE_PROVIDER_SITE_OTHER): Payer: Medicare Other | Admitting: Pharmacist

## 2020-12-30 DIAGNOSIS — E669 Obesity, unspecified: Secondary | ICD-10-CM | POA: Diagnosis not present

## 2020-12-30 DIAGNOSIS — I10 Essential (primary) hypertension: Secondary | ICD-10-CM

## 2020-12-30 DIAGNOSIS — E1169 Type 2 diabetes mellitus with other specified complication: Secondary | ICD-10-CM

## 2020-12-31 DIAGNOSIS — R809 Proteinuria, unspecified: Secondary | ICD-10-CM | POA: Diagnosis not present

## 2020-12-31 DIAGNOSIS — I10 Essential (primary) hypertension: Secondary | ICD-10-CM | POA: Diagnosis not present

## 2020-12-31 DIAGNOSIS — M1000 Idiopathic gout, unspecified site: Secondary | ICD-10-CM | POA: Diagnosis not present

## 2020-12-31 DIAGNOSIS — E1169 Type 2 diabetes mellitus with other specified complication: Secondary | ICD-10-CM | POA: Diagnosis not present

## 2020-12-31 DIAGNOSIS — N184 Chronic kidney disease, stage 4 (severe): Secondary | ICD-10-CM | POA: Diagnosis not present

## 2020-12-31 DIAGNOSIS — E559 Vitamin D deficiency, unspecified: Secondary | ICD-10-CM | POA: Diagnosis not present

## 2020-12-31 DIAGNOSIS — N189 Chronic kidney disease, unspecified: Secondary | ICD-10-CM | POA: Diagnosis not present

## 2020-12-31 DIAGNOSIS — R309 Painful micturition, unspecified: Secondary | ICD-10-CM | POA: Diagnosis not present

## 2020-12-31 DIAGNOSIS — E211 Secondary hyperparathyroidism, not elsewhere classified: Secondary | ICD-10-CM | POA: Diagnosis not present

## 2020-12-31 DIAGNOSIS — D649 Anemia, unspecified: Secondary | ICD-10-CM | POA: Diagnosis not present

## 2021-01-05 ENCOUNTER — Other Ambulatory Visit: Payer: Self-pay | Admitting: Specialist

## 2021-01-05 DIAGNOSIS — N183 Chronic kidney disease, stage 3 unspecified: Secondary | ICD-10-CM

## 2021-01-07 ENCOUNTER — Other Ambulatory Visit: Payer: Self-pay | Admitting: Specialist

## 2021-01-07 DIAGNOSIS — N189 Chronic kidney disease, unspecified: Secondary | ICD-10-CM

## 2021-01-07 NOTE — Progress Notes (Signed)
[facility] Summary Report / Loop Recorder 

## 2021-01-08 ENCOUNTER — Other Ambulatory Visit: Payer: Self-pay

## 2021-01-08 ENCOUNTER — Encounter: Payer: Self-pay | Admitting: Adult Health

## 2021-01-08 ENCOUNTER — Ambulatory Visit (INDEPENDENT_AMBULATORY_CARE_PROVIDER_SITE_OTHER): Payer: Medicare Other | Admitting: Adult Health

## 2021-01-08 VITALS — BP 160/84 | HR 55 | Temp 98.2°F | Wt 223.0 lb

## 2021-01-08 DIAGNOSIS — D509 Iron deficiency anemia, unspecified: Secondary | ICD-10-CM

## 2021-01-08 DIAGNOSIS — E782 Mixed hyperlipidemia: Secondary | ICD-10-CM | POA: Diagnosis not present

## 2021-01-08 DIAGNOSIS — Z0000 Encounter for general adult medical examination without abnormal findings: Secondary | ICD-10-CM | POA: Diagnosis not present

## 2021-01-08 DIAGNOSIS — I739 Peripheral vascular disease, unspecified: Secondary | ICD-10-CM | POA: Diagnosis not present

## 2021-01-08 DIAGNOSIS — I129 Hypertensive chronic kidney disease with stage 1 through stage 4 chronic kidney disease, or unspecified chronic kidney disease: Secondary | ICD-10-CM | POA: Diagnosis not present

## 2021-01-08 DIAGNOSIS — Z125 Encounter for screening for malignant neoplasm of prostate: Secondary | ICD-10-CM | POA: Diagnosis not present

## 2021-01-08 DIAGNOSIS — I63512 Cerebral infarction due to unspecified occlusion or stenosis of left middle cerebral artery: Secondary | ICD-10-CM

## 2021-01-08 DIAGNOSIS — E1169 Type 2 diabetes mellitus with other specified complication: Secondary | ICD-10-CM

## 2021-01-08 DIAGNOSIS — E669 Obesity, unspecified: Secondary | ICD-10-CM

## 2021-01-08 DIAGNOSIS — G729 Myopathy, unspecified: Secondary | ICD-10-CM

## 2021-01-08 LAB — COMPREHENSIVE METABOLIC PANEL
ALT: 13 U/L (ref 0–53)
AST: 13 U/L (ref 0–37)
Albumin: 4.3 g/dL (ref 3.5–5.2)
Alkaline Phosphatase: 47 U/L (ref 39–117)
BUN: 39 mg/dL — ABNORMAL HIGH (ref 6–23)
CO2: 23 mEq/L (ref 19–32)
Calcium: 9.4 mg/dL (ref 8.4–10.5)
Chloride: 110 mEq/L (ref 96–112)
Creatinine, Ser: 2.64 mg/dL — ABNORMAL HIGH (ref 0.40–1.50)
GFR: 23.19 mL/min — ABNORMAL LOW (ref >60.00)
Glucose, Bld: 127 mg/dL — ABNORMAL HIGH (ref 70–99)
Potassium: 4.2 mEq/L (ref 3.5–5.1)
Sodium: 142 mEq/L (ref 135–145)
Total Bilirubin: 0.4 mg/dL (ref 0.2–1.2)
Total Protein: 6.8 g/dL (ref 6.0–8.3)

## 2021-01-08 LAB — LIPID PANEL
Cholesterol: 193 mg/dL (ref 0–200)
HDL: 44.1 mg/dL (ref >39.00)
LDL Cholesterol: 125 mg/dL — ABNORMAL HIGH (ref 0–99)
NonHDL: 148.48
Total CHOL/HDL Ratio: 4
Triglycerides: 115 mg/dL (ref 0.0–149.0)
VLDL: 23 mg/dL (ref 0.0–40.0)

## 2021-01-08 LAB — CBC WITH DIFFERENTIAL/PLATELET
Basophils Absolute: 0.1 10*3/uL (ref 0.0–0.1)
Basophils Relative: 0.8 % (ref 0.0–3.0)
Eosinophils Absolute: 0.6 10*3/uL (ref 0.0–0.7)
Eosinophils Relative: 7.9 % — ABNORMAL HIGH (ref 0.0–5.0)
HCT: 33.5 % — ABNORMAL LOW (ref 39.0–52.0)
Hemoglobin: 11.1 g/dL — ABNORMAL LOW (ref 13.0–17.0)
Lymphocytes Relative: 25 % (ref 12.0–46.0)
Lymphs Abs: 1.9 10*3/uL (ref 0.7–4.0)
MCHC: 33.2 g/dL (ref 30.0–36.0)
MCV: 84.7 fl (ref 78.0–100.0)
Monocytes Absolute: 0.5 10*3/uL (ref 0.1–1.0)
Monocytes Relative: 6 % (ref 3.0–12.0)
Neutro Abs: 4.6 10*3/uL (ref 1.4–7.7)
Neutrophils Relative %: 60.3 % (ref 43.0–77.0)
Platelets: 240 10*3/uL (ref 150.0–400.0)
RBC: 3.96 Mil/uL — ABNORMAL LOW (ref 4.22–5.81)
RDW: 14.5 % (ref 11.5–15.5)
WBC: 7.6 10*3/uL (ref 4.0–10.5)

## 2021-01-08 LAB — HEMOGLOBIN A1C: Hgb A1c MFr Bld: 6.5 % (ref 4.6–6.5)

## 2021-01-08 LAB — TSH: TSH: 2.43 u[IU]/mL (ref 0.35–5.50)

## 2021-01-08 LAB — PSA: PSA: 0.66 ng/mL (ref 0.10–4.00)

## 2021-01-08 NOTE — Patient Instructions (Addendum)
It was great seeing you today   We will follow up with you regarding your labs   Please go to the [facility] office for your xray. Radiology is in the basement

## 2021-01-08 NOTE — Progress Notes (Addendum)
Subjective:    Patient ID: [name] [name], male    DOB: [birth year], 75 y.o.   MRN: [id number]  HPI Patient presents for yearly preventative medicine examination. He is a pleasant 75 year old male who  has a past medical history of Anemia, DIABETES MELLITUS, TYPE II (01/02/2007), HYPERLIPIDEMIA (06/18/2007), HYPERTENSION (01/02/2007), RENAL INSUFFICIENCY (02/05/2008), Stroke (HCC), and Stroke (HCC) (2019).  DM -currently prescribed Starlix 60 mg daily and Lantus 2 units am and 9 units in the PM.  When he was last seen 3 months ago his A1c had increased from 6.2 in July 2000 21-7.02 September 2020.  His blood sugars have been " all over the place", lows into the 115's upwards of 190's.  At this time Lantus was increased from 7 units to 9 units daily  Lab Results  Component Value Date   HGBA1C 7.2 (A) 09/01/2020    Hypertension with chronic kidney disease-he is managed by nephrology.  Currently prescribed hydralazine 50 mg 3 times daily, Norvasc 5 mg daily, and labetalol tall 200 mg 3 times daily. Was seen by his nephrology on 12/31/2020 - at this time his labs showed decreased kidney function with a GFR of 22 ( baseline around 25) , creatinine 2.9 ( baseline around 2.4) and BUN 45. He has a follow up appointment at the beginning of August.   Hyperlipidemia -intolerant to statins as a cause myalgia.  He does take Zetia 10 mg daily without any side effects Lab Results  Component Value Date   CHOL 163 01/15/2020   HDL 46 01/15/2020   LDLCALC 98 01/15/2020   LDLDIRECT 180.7 06/24/2013   TRIG 98 01/15/2020   CHOLHDL 3.5 01/15/2020   Iron Deficiency Anemia -seen by hematology every 6 months.  Has received IV iron infusions in the past.  Currently taking OTC iron and B12 supplementation.  H/o left middle cerbral stroke -is followed by neurology every 6 months.  Has no residual deficit.  Does have a loop recorder.  Is on aspirin 325 daily and Zetia for secondary stroke prevention  Chronic Low Back pain -  worse when changing positions. Pain worse some days rather than others. Takes Tylenol PRN for pain and this helps.   PVD - reports being referred to new vascular surgeon by Nephology   All immunizations and health maintenance protocols were reviewed with the patient and needed orders were placed.  Appropriate screening laboratory values were ordered for the patient including screening of hyperlipidemia, renal function and hepatic function. If indicated by BPH, a PSA was ordered.  Medication reconciliation,  past medical history, social history, problem list and allergies were reviewed in detail with the patient  Goals were established with regard to weight loss, exercise, and  diet in compliance with medications  Up to date on colon cancer screening - 7 year plan due to polyps. Last colonoscopy in 2020   Review of Systems  Constitutional:  Positive for fatigue (Chronic).  HENT: Negative.    Eyes: Negative.   Respiratory: Negative.    Cardiovascular: Negative.   Gastrointestinal: Negative.   Endocrine: Negative.   Genitourinary: Negative.   Musculoskeletal:  Positive for arthralgias, back pain and gait problem (Chronic instability).  Skin: Negative.   Allergic/Immunologic: Negative.   Hematological: Negative.   Psychiatric/Behavioral: Negative.    All other systems reviewed and are negative.  Past Medical History:  Diagnosis Date   Anemia    DIABETES MELLITUS, TYPE II 01/02/2007   HYPERLIPIDEMIA 06/18/2007   HYPERTENSION 01/02/2007  RENAL INSUFFICIENCY 02/05/2008   Stroke [facility])    Stroke [facility]) 2019    Social History   Socioeconomic History   Marital status: Married    Spouse name: Not on file   Number of children: Not on file   Years of education: Not on file   Highest education level: Not on file  Occupational History   Not on file  Tobacco Use   Smoking status: Never   Smokeless tobacco: Never  Vaping Use   Vaping Use: Never used  Substance and Sexual Activity    Alcohol use: No   Drug use: No   Sexual activity: Not on file  Other Topics Concern   Not on file  Social History Narrative   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 Likes to go to football games.    Social Determinants of Health   Financial Resource Strain: Low Risk    Difficulty of Paying Living Expenses: Not hard at all  Food Insecurity: No Food Insecurity   Worried About Charity fundraiser in the Last Year: Never true   [place] in the Last Year: Never true  Transportation Needs: No Transportation Needs   Lack of Transportation (Medical): No   Lack of Transportation (Non-Medical): No  Physical Activity: Inactive   Days of Exercise per Week: 0 days   Minutes of Exercise per Session: 0 min  Stress: No Stress Concern Present   Feeling of Stress : Not at all  Social Connections: Moderately Integrated   Frequency of Communication with Friends and Family: More than three times a week   Frequency of Social Gatherings with Friends and Family: Once a week   Attends Religious Services: Never   Marine scientist or Organizations: Yes   Attends Archivist Meetings: Never   Marital Status: Married  Human resources officer Violence: Not At Risk   Fear of Current or Ex-Partner: No   Emotionally Abused: No   Physically Abused: No   Sexually Abused: No    Past Surgical History:  Procedure Laterality Date   LOOP RECORDER INSERTION N/A 07/11/2017   Procedure: LOOP RECORDER INSERTION;  Surgeon: Thompson Grayer, MD;  Location: [facility];  Service: Cardiovascular;  Laterality: N/A;   SHOULDER SURGERY     left   TEE WITHOUT CARDIOVERSION N/A 07/11/2017   Procedure: TRANSESOPHAGEAL ECHOCARDIOGRAM (TEE);  Surgeon: Dorothy Spark, MD;  Location: [facility] ENDOSCOPY;  Service: Cardiovascular;  Laterality: N/A;   TRANSURETHRAL RESECTION OF PROSTATE     history of  retention/hematuria     Family History  Problem Relation Age of Onset   Hypertension Mother    Diabetes Mother    Stroke Father    Stroke Maternal Grandmother    Colon cancer Neg Hx    Esophageal cancer Neg Hx    Rectal cancer Neg Hx    Stomach cancer Neg Hx     Allergies  Allergen Reactions   Evolocumab Hypertension    Other reaction(s): Other (see comments)   Bactrim [Sulfamethoxazole-Trimethoprim] Hives, Itching and Swelling   Penicillins Hives    Has patient had a PCN reaction causing immediate rash, facial/tongue/throat swelling, SOB or lightheadedness with hypotension: Unk Has patient had a PCN reaction causing severe rash involving mucus membranes or skin necrosis: Unk Has patient had a PCN reaction  that required hospitalization: Unk Has patient had a PCN reaction occurring within the last 10 years: No If all of the above answers are "NO", then may proceed with Cephalosporin use. Other reaction(s): Other (see comments)   Statins     myalgia Other reaction(s): Other (see comments)   Sulfa Antibiotics     Other reaction(s): Other (see comments)   Sulfa Drugs Cross Reactors Hives, Itching and Swelling   Sulfamethoxazole-Trimethoprim Hives and Itching    Other reaction(s): Other (see comments)    Current Outpatient Medications on File Prior to Visit  Medication Sig Dispense Refill   amLODipine (NORVASC) 5 MG tablet Take 5 mg by mouth in the morning and at bedtime.     aspirin 325 MG tablet Take 325 mg by mouth daily.     B Complex Vitamins (VITAMIN B COMPLEX) TABS Take by mouth.     Blood Glucose Monitoring Suppl (ONETOUCH VERIO) w/Device KIT Use with existing test strips 1 kit kit   brimonidine (ALPHAGAN) 0.2 % ophthalmic solution Place 1 drop into both eyes 2 (two) times daily.     calcitRIOL (ROCALTROL) 0.25 MCG capsule Take 3 capsules by mouth daily.     Cholecalciferol 50 MCG (2000 UT) CAPS Take 1 capsule by mouth daily.     dorzolamide-timolol (COSOPT) 22.3-6.8  MG/ML ophthalmic solution Place 1 drop into both eyes 2 (two) times daily.     ezetimibe (ZETIA) 10 MG tablet TAKE 1 TABLET(10 MG) BY MOUTH DAILY 90 tablet 3   ferrous sulfate 325 (65 FE) MG tablet Take 65 mg by mouth daily.     hydrALAZINE (APRESOLINE) 50 MG tablet Take 50 mg by mouth 3 (three) times daily.     Insulin Pen Needle (B-D UF III MINI PEN NEEDLES) 31G X 5 MM MISC USE TO TEST BLOOD GLUCOSE FOUR TIMES DAILY 400 each 0   labetalol (NORMODYNE) 200 MG tablet TAKE 1 TABLET(200 MG) BY MOUTH THREE TIMES DAILY 270 tablet 3   LANTUS SOLOSTAR 100 UNIT/ML Solostar Pen ADMINISTER 7 TO 8 UNITS UNDER THE SKIN EVERY NIGHT AT BEDTIME 15 mL 2   nateglinide (STARLIX) 60 MG tablet TAKE 1 TABLET BY MOUTH ONCE A DAY WITH MEALS     ONETOUCH VERIO test strip TEST TWICE DAILY AS DIRECTED 200 strip 2   valsartan (DIOVAN) 160 MG tablet Take 80 mg by mouth 2 (two) times daily.     No current facility-administered medications on file prior to visit.    There were no vitals taken for this visit.       Objective:   Physical Exam Vitals and nursing note reviewed.  Constitutional:      General: He is not in acute distress.    Appearance: Normal appearance. He is well-developed and normal weight.  HENT:     Head: Normocephalic and atraumatic.     Right Ear: Tympanic membrane, ear canal and external ear normal. There is no impacted cerumen.     Left Ear: Tympanic membrane, ear canal and external ear normal. There is no impacted cerumen.     Nose: Nose normal. No congestion or rhinorrhea.     Mouth/Throat:     Mouth: Mucous membranes are moist.     Pharynx: Oropharynx is clear. No oropharyngeal exudate or posterior oropharyngeal erythema.  Eyes:     General:        Right eye: No discharge.        Left eye: No discharge.     Extraocular Movements: Extraocular  movements intact.     Conjunctiva/sclera: Conjunctivae normal.     Pupils: Pupils are equal, round, and reactive to light.  Neck:      Vascular: No carotid bruit.     Trachea: No tracheal deviation.  Cardiovascular:     Rate and Rhythm: Normal rate and regular rhythm.     Pulses: Normal pulses.     Heart sounds: Normal heart sounds. No murmur heard.   No friction rub. No gallop.  Pulmonary:     Effort: Pulmonary effort is normal. No respiratory distress.     Breath sounds: Normal breath sounds. No stridor. No wheezing, rhonchi or rales.  Chest:     Chest wall: No tenderness.  Abdominal:     General: Bowel sounds are normal. There is no distension.     Palpations: Abdomen is soft. There is no mass.     Tenderness: There is no abdominal tenderness. There is no right CVA tenderness, left CVA tenderness, guarding or rebound.     Hernia: No hernia is present.  Musculoskeletal:        General: No swelling, tenderness, deformity or signs of injury. Normal range of motion.     Right lower leg: No edema.     Left lower leg: No edema.  Lymphadenopathy:     Cervical: No cervical adenopathy.  Skin:    General: Skin is warm and dry.     Capillary Refill: Capillary refill takes less than 2 seconds.     Coloration: Skin is not jaundiced or pale.     Findings: No bruising, erythema, lesion or rash.  Neurological:     General: No focal deficit present.     Mental Status: He is alert and oriented to person, place, and time.     Cranial Nerves: No cranial nerve deficit.     Sensory: No sensory deficit.     Motor: No weakness.     Coordination: Coordination normal.     Gait: Gait normal.     Deep Tendon Reflexes: Reflexes normal.  Psychiatric:        Mood and Affect: Mood normal.        Behavior: Behavior normal.        Thought Content: Thought content normal.        Judgment: Judgment normal.      Assessment & Plan:  1. Routine general medical examination at a health care facility  - CBC with Differential/Platelet; Future - Comprehensive metabolic panel; Future - Hemoglobin A1c; Future - Lipid panel; Future - TSH;  Future - DG Lumbar Spine Complete; Future - TSH - Lipid panel - Hemoglobin A1c - Comprehensive metabolic panel - CBC with Differential/Platelet  2. Diabetes mellitus type 2 in obese ([place]) - Consider increase in insulin dose - CBC with Differential/Platelet; Future - Comprehensive metabolic panel; Future - Hemoglobin A1c; Future - Lipid panel; Future - TSH; Future - TSH - Lipid panel - Hemoglobin A1c - Comprehensive metabolic panel - CBC with Differential/Platelet  3. PVD (peripheral vascular disease) (HCC)  - CBC with Differential/Platelet; Future - Comprehensive metabolic panel; Future - Hemoglobin A1c; Future - Lipid panel; Future - TSH; Future - TSH - Lipid panel - Hemoglobin A1c - Comprehensive metabolic panel - CBC with Differential/Platelet  4. Iron deficiency anemia, unspecified iron deficiency anemia type  - CBC with Differential/Platelet; Future - Comprehensive metabolic panel; Future - Hemoglobin A1c; Future - Lipid panel; Future - TSH; Future - TSH - Lipid panel - Hemoglobin A1c - Comprehensive metabolic  panel - CBC with Differential/Platelet  5. Left middle cerebral artery stroke (HCC)  - CBC with Differential/Platelet; Future - Comprehensive metabolic panel; Future - Hemoglobin A1c; Future - Lipid panel; Future - TSH; Future - TSH - Lipid panel - Hemoglobin A1c - Comprehensive metabolic panel - CBC with Differential/Platelet  6. Prostate cancer screening  - PSA; Future - PSA  7. Mixed hyperlipidemia Continue with Zetia  - CBC with Differential/Platelet; Future - Comprehensive metabolic panel; Future - Hemoglobin A1c; Future - Lipid panel; Future - TSH; Future - TSH - Lipid panel - Hemoglobin A1c - Comprehensive metabolic panel - CBC with Differential/Platelet  8. Hypertensive kidney disease - Follow up with nephrology as directed - No NSAIDS - Low sodium diet  - CBC with Differential/Platelet; Future - Comprehensive  metabolic panel; Future - Hemoglobin A1c; Future - Lipid panel; Future - TSH; Future - TSH - Lipid panel - Hemoglobin A1c - Comprehensive metabolic panel - CBC with Differential/Platelet  9. Myopathy, unspecified - Intolerant to statins. Continue Zetia   Dorothyann Peng, NP

## 2021-01-12 ENCOUNTER — Ambulatory Visit
Admission: RE | Admit: 2021-01-12 | Discharge: 2021-01-12 | Disposition: A | Discharge status: Home / Self Care | Payer: Medicare Other | Source: Ambulatory Visit | Attending: Specialist | Admitting: Specialist

## 2021-01-12 ENCOUNTER — Telehealth: Payer: Self-pay | Admitting: Adult Health

## 2021-01-12 DIAGNOSIS — N3289 Other specified disorders of bladder: Secondary | ICD-10-CM | POA: Diagnosis not present

## 2021-01-12 DIAGNOSIS — N189 Chronic kidney disease, unspecified: Secondary | ICD-10-CM | POA: Diagnosis not present

## 2021-01-12 DIAGNOSIS — N2889 Other specified disorders of kidney and ureter: Secondary | ICD-10-CM | POA: Diagnosis not present

## 2021-01-12 DIAGNOSIS — N281 Cyst of kidney, acquired: Secondary | ICD-10-CM | POA: Diagnosis not present

## 2021-01-12 NOTE — Telephone Encounter (Signed)
Called [facility] back 2x no answer.

## 2021-01-12 NOTE — Telephone Encounter (Signed)
Patient's wife called back stating that someone called her with lab results on behalf of her husband. She says that she answered the call while sleep and does not remember what was discussed during the call.  She would like someone to give her a call back to discuss lab results when she is fully awake.  Please advise.

## 2021-01-13 ENCOUNTER — Telehealth: Payer: Self-pay | Admitting: Adult Health

## 2021-01-13 NOTE — Telephone Encounter (Signed)
Called 2x again no answer.

## 2021-01-13 NOTE — Telephone Encounter (Signed)
[name] notified of update °

## 2021-01-13 NOTE — Telephone Encounter (Signed)
Pt is returning your call and want a call back. 

## 2021-01-13 NOTE — Telephone Encounter (Signed)
[name] notified of update. No further action needed!

## 2021-01-14 ENCOUNTER — Ambulatory Visit (INDEPENDENT_AMBULATORY_CARE_PROVIDER_SITE_OTHER)
Admission: RE | Admit: 2021-01-14 | Discharge: 2021-01-14 | Disposition: A | Discharge status: Home / Self Care | Payer: Medicare Other | Source: Ambulatory Visit | Attending: Adult Health | Admitting: Adult Health

## 2021-01-14 ENCOUNTER — Other Ambulatory Visit: Payer: Self-pay

## 2021-01-14 DIAGNOSIS — M545 Low back pain, unspecified: Secondary | ICD-10-CM | POA: Diagnosis not present

## 2021-01-14 DIAGNOSIS — Z0000 Encounter for general adult medical examination without abnormal findings: Secondary | ICD-10-CM

## 2021-01-20 ENCOUNTER — Telehealth: Payer: Self-pay | Admitting: Adult Health

## 2021-01-20 LAB — CUP PACEART REMOTE DEVICE CHECK
Date Time Interrogation Session: 20220717232702
Implantable Pulse Generator Implant Date: 20190108

## 2021-01-20 NOTE — Telephone Encounter (Signed)
The patient would like to be referred to:  [facility] [address] [place], [place] [address] [phone]

## 2021-01-20 NOTE — Telephone Encounter (Signed)
Pts spouse is calling in to see if [name] [name] had received a report from his [place] she is wanting to know what the results were b/c she has not received a call back from the Nephrologist but they have received a call to set the pt up for an appointment with [facility].  Spouse would like to have a call back b/c she is a little worried.

## 2021-01-21 NOTE — Telephone Encounter (Signed)
Please result and advise 

## 2021-01-25 ENCOUNTER — Ambulatory Visit (INDEPENDENT_AMBULATORY_CARE_PROVIDER_SITE_OTHER): Payer: Medicare Other

## 2021-01-25 DIAGNOSIS — I639 Cerebral infarction, unspecified: Secondary | ICD-10-CM | POA: Diagnosis not present

## 2021-01-26 ENCOUNTER — Telehealth: Payer: Self-pay | Admitting: Adult Health

## 2021-01-26 NOTE — Telephone Encounter (Signed)
Patient Spouse need to schedule an ov for more refills.

## 2021-01-26 NOTE — Telephone Encounter (Signed)
Pt wife is returning your call and want a call back.

## 2021-01-26 NOTE — Telephone Encounter (Signed)
Left message to return phone call.

## 2021-01-29 DIAGNOSIS — D49512 Neoplasm of unspecified behavior of left kidney: Secondary | ICD-10-CM | POA: Diagnosis not present

## 2021-02-15 ENCOUNTER — Encounter: Payer: Self-pay | Admitting: Adult Health

## 2021-02-16 NOTE — Progress Notes (Signed)
[facility] Summary Report / Loop Recorder 

## 2021-02-18 ENCOUNTER — Other Ambulatory Visit: Payer: Self-pay | Admitting: Urology

## 2021-02-18 DIAGNOSIS — D49512 Neoplasm of unspecified behavior of left kidney: Secondary | ICD-10-CM

## 2021-02-25 ENCOUNTER — Telehealth: Payer: Self-pay

## 2021-02-25 NOTE — Telephone Encounter (Signed)
Linq alert received- Device reached RRT on 01/26/21.   Spoke with pt spouse(DPR on file) she will unplug monitor as all future monitoring is discontinued.  Confirmed address to mail return kit to.  [facility] and paceart updated   Spouse will discuss with patient whether he ants removed, she v./u to call office to schedule appt with JA if pt does want it removed.

## 2021-03-10 ENCOUNTER — Other Ambulatory Visit: Payer: Self-pay

## 2021-03-10 ENCOUNTER — Ambulatory Visit
Admission: RE | Admit: 2021-03-10 | Discharge: 2021-03-10 | Disposition: A | Discharge status: Home / Self Care | Payer: Medicare Other | Source: Ambulatory Visit | Attending: Urology | Admitting: Urology

## 2021-03-10 DIAGNOSIS — N189 Chronic kidney disease, unspecified: Secondary | ICD-10-CM | POA: Diagnosis not present

## 2021-03-10 DIAGNOSIS — K7689 Other specified diseases of liver: Secondary | ICD-10-CM | POA: Diagnosis not present

## 2021-03-10 DIAGNOSIS — D49512 Neoplasm of unspecified behavior of left kidney: Secondary | ICD-10-CM

## 2021-03-10 DIAGNOSIS — N281 Cyst of kidney, acquired: Secondary | ICD-10-CM | POA: Diagnosis not present

## 2021-03-24 DIAGNOSIS — H35033 Hypertensive retinopathy, bilateral: Secondary | ICD-10-CM | POA: Diagnosis not present

## 2021-03-24 DIAGNOSIS — H40023 Open angle with borderline findings, high risk, bilateral: Secondary | ICD-10-CM | POA: Diagnosis not present

## 2021-03-24 DIAGNOSIS — H0102B Squamous blepharitis left eye, upper and lower eyelids: Secondary | ICD-10-CM | POA: Diagnosis not present

## 2021-03-24 DIAGNOSIS — H34212 Partial retinal artery occlusion, left eye: Secondary | ICD-10-CM | POA: Diagnosis not present

## 2021-03-24 DIAGNOSIS — H0102A Squamous blepharitis right eye, upper and lower eyelids: Secondary | ICD-10-CM | POA: Diagnosis not present

## 2021-03-24 DIAGNOSIS — H2513 Age-related nuclear cataract, bilateral: Secondary | ICD-10-CM | POA: Diagnosis not present

## 2021-03-24 DIAGNOSIS — H353131 Nonexudative age-related macular degeneration, bilateral, early dry stage: Secondary | ICD-10-CM | POA: Diagnosis not present

## 2021-03-24 LAB — HM DIABETES EYE EXAM

## 2021-03-26 ENCOUNTER — Telehealth: Payer: Self-pay | Admitting: Pharmacist

## 2021-03-26 NOTE — Chronic Care Management (AMB) (Signed)
    Chronic Care Management Pharmacy Assistant   Name: [name] [name]  MRN: [id number] DOB: January 29, 1946  03/29/21 APPOINTMENT REMINDER   [name] [name] was reminded to have all medications, supplements and any blood glucose and blood pressure readings available for review with Jeni Salles, Pharm. D, at his telephone visit on 03/29/21 at 9am.   Questions: Have you had any recent office visit or specialist visit outside of [place]? Yes. Patients nephrologist is in [place].  Are there any concerns you would like to discuss during your office visit? Only concern was there was an issue with cost on patients insulin pens at [facility] yesterday bu the issue was resolved.  Are you having any problems obtaining your medications? No issues currently.  If patient has any PAP medications ask if they are having any problems getting their PAP medication or refill? No patient assistance at this time.  Care Gaps:  AWV - scheduled for 08/16/21 Zoster vaccines - never done Covid-19 vaccine booster 3 - overdue since 07/10/20 Fecal DNA -overdue since 4/22 Flu vaccine - due  Star Rating Drug:  Nateglinide 60mg  - last filled on 03/18/21 90DS at [facility] Valsartan 160mg  - last filled on 02/17/21 90DS at [facility]  Any gaps in medications fill history? No.  [place]  Clinical Pharmacist Assistant [phone]

## 2021-03-29 ENCOUNTER — Ambulatory Visit (INDEPENDENT_AMBULATORY_CARE_PROVIDER_SITE_OTHER): Payer: Medicare Other | Admitting: Pharmacist

## 2021-03-29 DIAGNOSIS — I129 Hypertensive chronic kidney disease with stage 1 through stage 4 chronic kidney disease, or unspecified chronic kidney disease: Secondary | ICD-10-CM

## 2021-03-29 DIAGNOSIS — E1169 Type 2 diabetes mellitus with other specified complication: Secondary | ICD-10-CM

## 2021-03-29 DIAGNOSIS — E669 Obesity, unspecified: Secondary | ICD-10-CM

## 2021-03-29 DIAGNOSIS — E782 Mixed hyperlipidemia: Secondary | ICD-10-CM

## 2021-03-29 NOTE — Progress Notes (Signed)
Chronic Care Management Pharmacy Note  03/29/2021 Name:  [name] [name] MRN:  [id number] DOB:  [birth year]  Summary: BP home readings have variable A1c at goal < 7% LDL not at goal < 70  Recommendations/Changes made from today's visit: -Recommended discussing with nephrologist about starting BP medication for PRN use with parameters -Consider CGM to track blood sugars -Consider the addition of bempedoic acid for LDL lowering  Plan: Follow up after nephrology appt in October for updated labs work  Subjective: [name] [name] is an 75 y.o. year old male who is a primary patient of Dorothyann Peng, NP.  The CCM team was consulted for assistance with disease management and care coordination needs.    Engaged with patient by telephone for follow up visit in response to provider referral for pharmacy case management and/or care coordination services.   Consent to Services:  The patient was given information about Chronic Care Management services, agreed to services, and gave verbal consent prior to initiation of services.  Please see initial visit note for detailed documentation.   Patient Care Team: Dorothyann Peng, NP as PCP - General (Family Medicine) Adegoroye, Wynona Luna, MD (Nephrology) Viona Gilmore, [facility] as Pharmacist (Pharmacist)  Recent office visits: 01/08/21 Dorothyann Peng, NP: Patient presented for annual exam. BP elevated in office visit. LDL increased.  10/20/20 Telephone encounter: PCP increased Lantus to 2 units in the morning and remain on current nighttime regimen.   09/22/20 Dorothyann Peng, NP: Patient presented for DM and HTN follow up. Increased Lantus from 7 units to 9 units.  09/01/20 Dorothyann Peng, NP: Patient presented for DM follow up. A1c was elevated at 7.2%.   08/13/20 Ofilia Neas, LPN: Patient presented for AWV.  Recent consult visits: 01/17/21 Gifford Shave, MD (nephrology): Patient presented for CKD.   12/31/20 Gifford Shave, MD  (nephrology): Patient presented for CKD.  No medication changes.  09/25/20 Gifford Shave, MD (nephrology): Patient presented for CKD. Increased amlodipine to 5 mg BID.  09/07/20 Roma Kayser, RN (cardiology): Patient presented for remote pacemaker device check.  08/07/20 Sullivan Lone, MD (oncology): Patient presented for iron deficiency anemia. Follow up PRN.  05/26/20 Thurston Hole (optometry): Patient presented for follow up. Unable to access notes.  05/15/20 Gifford Shave, MD (nephrology): Patient presented for CKD.  05/04/20 Rosemary Holms (podiatry): Patient presented for PVD and bunion history. Unable to access notes.    Hospital visits: None in previous 6 months  Objective:  Lab Results  Component Value Date   CREATININE 2.64 (H) 01/08/2021   BUN 39 (H) 01/08/2021   GFR 23.19 (L) 01/08/2021   GFRNONAA 23 (L) 08/07/2020   GFRAA 30 05/24/2019   NA 142 01/08/2021   K 4.2 01/08/2021   CALCIUM 9.4 01/08/2021   CO2 23 01/08/2021   GLUCOSE 127 (H) 01/08/2021    Lab Results  Component Value Date/Time   HGBA1C 6.5 01/08/2021 08:18 AM   HGBA1C 7.2 (A) 09/01/2020 09:45 AM   HGBA1C 6.2 (H) 01/15/2020 08:11 AM   HGBA1C 6.5 06/19/2018 08:43 AM   HGBA1C 5.5 03/21/2018 07:14 AM   GFR 23.19 (L) 01/08/2021 08:18 AM   GFR 25.43 (L) 09/01/2020 07:08 AM   MICROALBUR 2.2 (H) 02/13/2014 09:53 AM   MICROALBUR 2.4 (H) 09/30/2013 09:50 AM    Last diabetic Eye exam:  Lab Results  Component Value Date/Time   HMDIABEYEEXA No Retinopathy 07/04/2013 12:00 AM    Last diabetic Foot exam:  Lab Results  Component Value Date/Time   HMDIABFOOTEX done  09/30/2013 12:00 AM     Lab Results  Component Value Date   CHOL 193 01/08/2021   HDL 44.10 01/08/2021   LDLCALC 125 (H) 01/08/2021   LDLDIRECT 180.7 06/24/2013   TRIG 115.0 01/08/2021   CHOLHDL 4 01/08/2021    Hepatic Function Latest Ref Rng & Units 01/08/2021 08/07/2020 01/15/2020  Total Protein 6.0 - 8.3 g/dL 6.8 6.9 6.6   Albumin 3.5 - 5.2 g/dL 4.3 3.9 -  AST 0 - 37 U/L 13 14(L) 15  ALT 0 - 53 U/L _0 Alk Phosphatase 39 - 117 U/L 47 58 -  Total Bilirubin 0.2 - 1.2 mg/dL 0.4 0.4 0.4  Bilirubin, Direct 0.0 - 0.3 mg/dL - - -    Lab Results  Component Value Date/Time   TSH 2.43 01/08/2021 08:18 AM   TSH 2.83 01/15/2020 08:11 AM    CBC Latest Ref Rng & Units 01/08/2021 09/01/2020 08/07/2020  WBC 4.0 - 10.5 K/uL 7.6 6.9 7.2  Hemoglobin 13.0 - 17.0 g/dL 11.1(L) 13.0 12.0(L)  Hematocrit 39.0 - 52.0 % 33.5(L) 38.6(L) 35.8(L)  Platelets 150.0 - 400.0 K/uL 240.0 221.0 217    Lab Results  Component Value Date/Time   VD25OH 45 09/04/2019 12:00 AM   VD25OH 50.29 11/02/2018 08:16 AM   VD25OH 46 09/18/2018 12:00 AM   VD25OH 12.19 (L) 09/19/2017 10:00 AM    Clinical ASCVD: Yes  The ASCVD Risk score (Arnett DK, et al., 2019) failed to calculate for the following reasons:   The patient has a prior MI or stroke diagnosis    Depression screen [facility] 2/9 08/13/2020 01/08/2020 01/08/2020  Decreased Interest 0 0 0  Down, Depressed, Hopeless 0 0 0  PHQ - 2 Score 0 0 0  Altered sleeping - - -  Tired, decreased energy - - -  Change in appetite - - -  Feeling bad or failure about yourself  - - -  Trouble concentrating - - -  Moving slowly or fidgety/restless - - -  Suicidal thoughts - - -  PHQ-9 Score - - -  Difficult doing work/chores - - -  Some recent data might be hidden      Social History   Tobacco Use  Smoking Status Never  Smokeless Tobacco Never   BP Readings from Last 3 Encounters:  01/08/21 (!) 160/84  09/22/20 140/80  09/01/20 (!) 146/78   Pulse Readings from Last 3 Encounters:  01/08/21 (!) 55  09/22/20 (!) 52  09/01/20 64   Wt Readings from Last 3 Encounters:  01/08/21 223 lb (101.2 kg)  09/22/20 227 lb 6.4 oz (103.1 kg)  09/01/20 229 lb 6.4 oz (104.1 kg)   BMI Readings from Last 3 Encounters:  01/08/21 28.63 kg/m  09/22/20 29.20 kg/m  09/01/20 29.45 kg/m     Assessment/Interventions: Review of patient past medical history, allergies, medications, health status, including review of consultants reports, laboratory and other test data, was performed as part of comprehensive evaluation and provision of chronic care management services.   SDOH:  (Social Determinants of Health) assessments and interventions performed: No   SDOH Screenings   Alcohol Screen: Low Risk    Last Alcohol Screening Score (AUDIT): 0  Depression (PHQ2-9): Low Risk    PHQ-2 Score: 0  Financial Resource Strain: Low Risk    Difficulty of Paying Living Expenses: Not hard at all  Food Insecurity: No Food Insecurity   Worried About Charity fundraiser in the Last Year: Never true   Ran  Out of Food in the Last Year: Never true  Housing: Low Risk    Last Housing Risk Score: 0  Physical Activity: Inactive   Days of Exercise per Week: 0 days   Minutes of Exercise per Session: 0 min  Social Connections: Moderately Integrated   Frequency of Communication with Friends and Family: More than three times a week   Frequency of Social Gatherings with Friends and Family: Once a week   Attends Religious Services: Never   Marine scientist or Organizations: Yes   Attends Archivist Meetings: Never   Marital Status: Married  Stress: No Stress Concern Present   Feeling of Stress : Not at all  Tobacco Use: Low Risk    Smoking Tobacco Use: Never   Smokeless Tobacco Use: Never  Transportation Needs: No Transportation Needs   Lack of Transportation (Medical): No   Lack of Transportation (Non-Medical): No    CCM Care Plan  Allergies  Allergen Reactions   Evolocumab Hypertension    Other reaction(s): Other (see comments)   Bactrim [Sulfamethoxazole-Trimethoprim] Hives, Itching and Swelling   Penicillins Hives    Has patient had a PCN reaction causing immediate rash, facial/tongue/throat swelling, SOB or lightheadedness with hypotension: Unk Has patient had a PCN  reaction causing severe rash involving mucus membranes or skin necrosis: Unk Has patient had a PCN reaction that required hospitalization: Unk Has patient had a PCN reaction occurring within the last 10 years: No If all of the above answers are "NO", then may proceed with Cephalosporin use. Other reaction(s): Other (see comments)   Statins     myalgia Other reaction(s): Other (see comments)   Sulfa Antibiotics     Other reaction(s): Other (see comments)   Sulfa Drugs Cross Reactors Hives, Itching and Swelling   Sulfamethoxazole-Trimethoprim Hives and Itching    Other reaction(s): Other (see comments)    Medications Reviewed Today     Reviewed by Franco Collet, CMA (Certified Medical Assistant) on 01/08/21 at [phone]  Med List Status: <None>   Medication Order Taking? Sig Documenting Provider Last Dose Status Informant  amLODipine (NORVASC) 5 MG tablet [id number]  Take 5 mg by mouth in the morning and at bedtime. [provider]  Active   aspirin 325 MG tablet [id number]  Take 325 mg by mouth daily. [provider]  Active   B Complex Vitamins (VITAMIN B COMPLEX) TABS [id number]  Take by mouth. [provider]  Active   Blood Glucose Monitoring Suppl [facility] VERIO) w/Device Drucie Opitz [id number]  Use with existing test strips Nafziger, [name], NP  Active   brimonidine (ALPHAGAN) 0.2 % ophthalmic solution [id number]  Place 1 drop into both eyes 2 (two) times daily. [provider]  Active Self  calcitRIOL (ROCALTROL) 0.25 MCG capsule [id number]  Take 3 capsules by mouth daily. [provider]  Active Multiple Informants  Cholecalciferol 50 MCG (2000 UT) CAPS [id number]  Take 1 capsule by mouth daily. [provider]  Active   dorzolamide-timolol (COSOPT) 22.3-6.8 MG/ML ophthalmic solution [id number]  Place 1 drop into both eyes 2 (two) times daily. [provider]  Active Self  ezetimibe (ZETIA) 10 MG tablet [id number]  TAKE 1 TABLET(10 MG) BY  MOUTH DAILY Nafziger, [name], NP  Active   ferrous sulfate 325 (65 FE) MG tablet [id number]  Take 65 mg by mouth daily. [provider]  Active   hydrALAZINE (APRESOLINE) 50 MG tablet [id number]  Take 50 mg by mouth 3 (three) times daily. [provider]  Active   Insulin Pen Needle (B-D UF III MINI PEN NEEDLES) 31G X 5 MM MISC [id number]  USE TO TEST BLOOD GLUCOSE FOUR TIMES DAILY Nafziger, [name], NP  Active   labetalol (NORMODYNE) 200 MG tablet [id number]  TAKE 1 TABLET(200 MG) BY MOUTH THREE TIMES DAILY Nafziger, [name], NP  Active   LANTUS SOLOSTAR 100 UNIT/ML Solostar Pen [id number]  ADMINISTER 7 TO 8 UNITS UNDER THE SKIN EVERY NIGHT AT BEDTIME Nafziger, [name], NP  Active   nateglinide (STARLIX) 60 MG tablet [id number]  TAKE 1 TABLET BY MOUTH ONCE A DAY WITH MEALS [provider]  Active   ONETOUCH VERIO test strip [id number]  TEST TWICE DAILY AS DIRECTED Nafziger, [name], NP  Active   valsartan (DIOVAN) 160 MG tablet [id number]  Take 80 mg by mouth 2 (two) times daily. [provider]  Active             Patient Active Problem List   Diagnosis Date Noted   Drug-induced myopathy 01/08/2020   Iron deficiency anemia 04/03/2019   Anemia of chronic renal failure, stage 4 (severe) ([place]) 04/03/2019   Late effect of cerebrovascular accident (CVA) 12/07/2017   Hypoglycemia    Uncontrolled hypertension    Hypokalemia    Acute ischemic left MCA stroke ([place]) 07/12/2017   Diabetes mellitus type 2 in obese ([place])    Stage 3 chronic kidney disease (HCC)    Left middle cerebral artery stroke ([place]) 06/00/4599   Acute embolic stroke ([place])    Aphasia 07/08/2017   Gout 07/21/2011   Prostatitis, chronic 07/14/2010   Hyperlipidemia 06/18/2007   Hypertensive kidney disease 01/02/2007    Immunization History  Administered Date(s) Administered   Influenza, High Dose Seasonal PF 06/19/2018   PFIZER(Purple Top)SARS-COV-2 Vaccination 01/18/2020, 02/08/2020   Pneumococcal  Conjugate-13 01/03/2019   Pneumococcal Polysaccharide-23 12/20/2011   Td 11/13/2009   Patient's wife reports BP has been inconsistent lately and is concerned about this. Patient has been more stressed lately and thinks this may be affecting his BP. His daughter has been going through a rough patch. He did have a loop recorder in place  His blood sugars have been a lot more consistent lately. He has had a couple of higher readings but these can be attributed to foods he has been eating.   Patient's wife picked up Lantus from the pharmacy and they charged $105 for one pen and found out that they were charging for a 3 months supply but only have 1 months worth. Patient's wife reported the situation has been handled but is aware to contact the office if this happens again.  Conditions to be addressed/monitored:  Hypertension, Hyperlipidemia, Diabetes, Chronic Kidney Disease, Gout and glaucoma, anemia, history of stroke  Conditions addressed this visit: Diabetes, hypertension, hyperlipidemia  Care Plan : CCM Pharmacy Care Plan  Updates made by Viona Gilmore, [place] since 03/29/2021 12:00 AM     Problem: Problem: Hypertension, Hyperlipidemia, Diabetes, Chronic Kidney Disease, Gout and glaucoma, anemia, history of stroke      Long-Range Goal: Patient-Specific Goal   Start Date: 09/30/2020  Expected End Date: 09/30/2021  Recent Progress: On track  Priority: High  Note:   Current Barriers:  Unable to achieve control of blood pressure and diabetes   Pharmacist Clinical Goal(s):  Patient will achieve control of blood pressure as evidenced by home blood pressure readings maintain control of diabetes as evidenced by A1c and blood sugars  through collaboration with PharmD and provider.  Interventions: 1:1 collaboration with Dorothyann Peng, NP regarding development and update of comprehensive plan of care as evidenced by provider attestation and co-signature Inter-disciplinary care team  collaboration (see longitudinal plan of care) Comprehensive medication review performed; medication list updated in electronic medical record  Hypertension (BP goal <140/90) -Uncontrolled -Current treatment: Amlodipine 5 mg 1 tablet twice daily Hydralazine 50 mg 1 tablet three times daily Labetalol 200 mg 1 tablet three times daily Valsartan 160 mg 1/2 tablet twice daily -Medications previously tried: benazepril atenolol-chlorthalidone  -Current home readings: 130-150; 158/81, 137/68, highest 180/78 (evening), 186/77 (highest), 173/83, lowest 131/70s-81 -Current dietary habits: patient is limiting salt and trying to eat cleaner since the stroke -Current exercise habits: going to the gym and using an elliptical 3 days a week -Denies hypotensive/hypertensive symptoms -Educated on Exercise goal of 150 minutes per week; Importance of home blood pressure monitoring; Proper BP monitoring technique; -Counseled to monitor BP at home daily, document, and provide log at future appointments -Counseled on diet and exercise extensively Recommended to continue current medication Recommended as needed BP medication due to variable nature of BP readings.  Hyperlipidemia: (LDL goal < 70) -Uncontrolled -Current treatment: Ezetimibe 10 mg 1 tablet daily -Medications previously tried: Repatha (high BP), statins (myalgias) (pravastatin, atorvastatin, simvastatin) -Current dietary patterns: baking foods instead of frying; eating lots of fiber; has been consuming more ice cream lately -Current exercise habits: not active right now -Educated on Cholesterol goals;  Importance of limiting foods high in cholesterol; Exercise goal of 150 minutes per week; -Counseled on diet and exercise extensively Recommended to continue current medication Recommended limiting ice cream consumption.  History of stroke (Goal: prevent recurrent strokes) -Controlled -Current treatment  Aspirin 325 mg 1 tablet  daily -Medications previously tried: none  -Recommended to continue current medication Counseled on monitoring for signs of bleeding such as unexplained and excessive bleeding from a cut or injury, easy or excessive bruising, blood in urine or stools, and nosebleeds without a known cause  Diabetes (A1c goal <8%) (per PCP) -Controlled -Current medications: Lantus inject 2 units in the morning and 9 units in the evening Nateglinide 60 mg 1 tablet once daily with meals (needs to be 15 mins before meals) -Medications previously tried: glyburide, Novolog, Januvia -Current home glucose readings fasting glucose: 130-140s; lowest of 106 post prandial glucose: not checking after meals -Reports hypoglycemic/hyperglycemic symptoms -Current meal patterns:  breakfast: did not discuss  lunch: did not discuss   dinner: did not discuss  snacks: did not discuss  drinks: did not discuss  -Current exercise: not active right now -Educated on Exercise goal of 150 minutes per week; Benefits of routine self-monitoring of blood sugar; Carbohydrate counting and/or plate method -Counseled to check feet daily and get yearly eye exams -Counseled on diet and exercise extensively Recommended to continue current medication Recommended checking blood sugars after meals to make sure it is not running lower.  Anemia (Goal: Hgb > 11) -Controlled -Current treatment  Ferrous sulfate 325 mg 1 tablet daily -Medications previously tried: none  -Recommended to continue current medication Counseled on taking with food to prevent upset stomach  Chronic kidney disease (Goal: prevent worsening of kidney function) -Controlled -Current treatment  No medications -Medications previously tried: none  -Counseled on diet and exercise extensively  Glaucoma (Goal: lower intraocular pressure) -Controlled -Current treatment  Alphagan 0.2% daily Cosopt 22.3-6.8 daily -Medications previously tried: none  -Recommended to  continue current medication   Health Maintenance -Vaccine gaps: tetanus, shingles -Current therapy:  Vitamin D  2000 units daily Vitamin B complex daily -Educated on Cost vs benefit of each product must be carefully weighed by individual consumer -Patient is satisfied with current therapy and denies issues -Recommended to continue current medication  Patient Goals/Self-Care Activities Patient will:  - take medications as prescribed check glucose daily, document, and provide at future appointments check blood pressure a few times daily, document, and provide at future appointments  Follow Up Plan: The care management team will reach out to the patient again over the next 30 days.         Medication Assistance: None required.  Patient affirms current coverage meets needs.  Compliance/Adherence/Medication fill history: Care Gaps: Shingrix, COVID booster, cologuard  Star-Rating Drugs: Nateglinide 42m - last filled on 03/18/21 90DS at [facility] Valsartan 1691m- [facility]  [id number]: [phone]: [phone] pill box? No - basket on the counter Pt endorses 100% compliance  We discussed: Current pharmacy is preferred with insurance plan and patient is satisfied with pharmacy services Patient  to: Continue current medication management strategy  Care Plan and Follow Up Patient Decision:  Patient agrees to Care Plan and Follow-up.  Plan: The care management team will reach out to the patient again over the next 30 days.  MaJeni SallesPharmD BCMountain View Regional Medical Centerlinical Pharmacist [place]

## 2021-04-02 DIAGNOSIS — I129 Hypertensive chronic kidney disease with stage 1 through stage 4 chronic kidney disease, or unspecified chronic kidney disease: Secondary | ICD-10-CM

## 2021-04-02 DIAGNOSIS — E782 Mixed hyperlipidemia: Secondary | ICD-10-CM | POA: Diagnosis not present

## 2021-04-02 DIAGNOSIS — E1169 Type 2 diabetes mellitus with other specified complication: Secondary | ICD-10-CM

## 2021-04-02 DIAGNOSIS — E669 Obesity, unspecified: Secondary | ICD-10-CM | POA: Diagnosis not present

## 2021-04-09 ENCOUNTER — Encounter: Payer: Self-pay | Admitting: Adult Health

## 2021-04-16 DIAGNOSIS — N189 Chronic kidney disease, unspecified: Secondary | ICD-10-CM | POA: Diagnosis not present

## 2021-04-16 DIAGNOSIS — I10 Essential (primary) hypertension: Secondary | ICD-10-CM | POA: Diagnosis not present

## 2021-04-16 DIAGNOSIS — N184 Chronic kidney disease, stage 4 (severe): Secondary | ICD-10-CM | POA: Diagnosis not present

## 2021-04-16 DIAGNOSIS — M1000 Idiopathic gout, unspecified site: Secondary | ICD-10-CM | POA: Diagnosis not present

## 2021-04-16 DIAGNOSIS — R809 Proteinuria, unspecified: Secondary | ICD-10-CM | POA: Diagnosis not present

## 2021-04-16 DIAGNOSIS — E559 Vitamin D deficiency, unspecified: Secondary | ICD-10-CM | POA: Diagnosis not present

## 2021-04-16 DIAGNOSIS — R309 Painful micturition, unspecified: Secondary | ICD-10-CM | POA: Diagnosis not present

## 2021-04-16 DIAGNOSIS — E211 Secondary hyperparathyroidism, not elsewhere classified: Secondary | ICD-10-CM | POA: Diagnosis not present

## 2021-04-16 DIAGNOSIS — D649 Anemia, unspecified: Secondary | ICD-10-CM | POA: Diagnosis not present

## 2021-04-16 DIAGNOSIS — E1169 Type 2 diabetes mellitus with other specified complication: Secondary | ICD-10-CM | POA: Diagnosis not present

## 2021-05-04 DIAGNOSIS — I129 Hypertensive chronic kidney disease with stage 1 through stage 4 chronic kidney disease, or unspecified chronic kidney disease: Secondary | ICD-10-CM | POA: Diagnosis not present

## 2021-05-04 DIAGNOSIS — N184 Chronic kidney disease, stage 4 (severe): Secondary | ICD-10-CM | POA: Diagnosis not present

## 2021-05-04 DIAGNOSIS — I739 Peripheral vascular disease, unspecified: Secondary | ICD-10-CM | POA: Diagnosis not present

## 2021-05-04 DIAGNOSIS — E1122 Type 2 diabetes mellitus with diabetic chronic kidney disease: Secondary | ICD-10-CM | POA: Diagnosis not present

## 2021-05-04 DIAGNOSIS — S91109S Unspecified open wound of unspecified toe(s) without damage to nail, sequela: Secondary | ICD-10-CM | POA: Diagnosis not present

## 2021-05-04 DIAGNOSIS — I6523 Occlusion and stenosis of bilateral carotid arteries: Secondary | ICD-10-CM | POA: Diagnosis not present

## 2021-05-04 DIAGNOSIS — N183 Chronic kidney disease, stage 3 unspecified: Secondary | ICD-10-CM | POA: Diagnosis not present

## 2021-05-17 ENCOUNTER — Other Ambulatory Visit: Payer: Self-pay | Admitting: Adult Health

## 2021-05-17 ENCOUNTER — Telehealth: Payer: Self-pay | Admitting: Adult Health

## 2021-05-17 NOTE — Telephone Encounter (Signed)
Pt needs a DM f/u °

## 2021-05-17 NOTE — Telephone Encounter (Signed)
Patient's wife called back to follow up on missed call with no voicemail from office. I let her know it may be [facility] that was trying to call because patient needs a follow up in order to get more refills on prescription. Wife went ahead and scheduled appointment but did want a call back if it was someone trying to get in touch with them and not an automated call.

## 2021-05-19 NOTE — Telephone Encounter (Signed)
Noted  

## 2021-05-19 NOTE — Telephone Encounter (Signed)
Pt wife is returning [place] call and pt does have an appt sch for tomorrow

## 2021-05-20 ENCOUNTER — Ambulatory Visit (INDEPENDENT_AMBULATORY_CARE_PROVIDER_SITE_OTHER): Payer: Medicare Other | Admitting: Adult Health

## 2021-05-20 VITALS — BP 140/70 | HR 65 | Temp 98.8°F | Wt 225.6 lb

## 2021-05-20 DIAGNOSIS — I739 Peripheral vascular disease, unspecified: Secondary | ICD-10-CM

## 2021-05-20 DIAGNOSIS — E1169 Type 2 diabetes mellitus with other specified complication: Secondary | ICD-10-CM | POA: Diagnosis not present

## 2021-05-20 DIAGNOSIS — E669 Obesity, unspecified: Secondary | ICD-10-CM | POA: Diagnosis not present

## 2021-05-20 DIAGNOSIS — I129 Hypertensive chronic kidney disease with stage 1 through stage 4 chronic kidney disease, or unspecified chronic kidney disease: Secondary | ICD-10-CM

## 2021-05-20 LAB — BASIC METABOLIC PANEL
BUN: 43 mg/dL — ABNORMAL HIGH (ref 6–23)
CO2: 24 mEq/L (ref 19–32)
Calcium: 9.2 mg/dL (ref 8.4–10.5)
Chloride: 107 mEq/L (ref 96–112)
Creatinine, Ser: 2.7 mg/dL — ABNORMAL HIGH (ref 0.40–1.50)
GFR: 22.52 mL/min — ABNORMAL LOW (ref >60.00)
Glucose, Bld: 188 mg/dL — ABNORMAL HIGH (ref 70–99)
Potassium: 4.3 mEq/L (ref 3.5–5.1)
Sodium: 138 mEq/L (ref 135–145)

## 2021-05-20 LAB — PROTIME-INR
INR: 1.1 ratio — ABNORMAL HIGH (ref 0.8–1.0)
Prothrombin Time: 11.9 s (ref 9.6–13.1)

## 2021-05-20 LAB — HEMOGLOBIN A1C: Hgb A1c MFr Bld: 7.2 % — ABNORMAL HIGH (ref 4.6–6.5)

## 2021-05-20 MED ORDER — TOUJEO SOLOSTAR 300 UNIT/ML ~~LOC~~ SOPN: 8.0000 [IU] | PEN_INJECTOR | Freq: Every day | SUBCUTANEOUS | 3 refills | Status: DC

## 2021-05-20 NOTE — Progress Notes (Signed)
Subjective:    Patient ID: [name] [name], male    DOB: Nov 21, 1945, 75 y.o.   MRN: [id number]  HPI 75 year old male who  has a past medical history of Anemia, DIABETES MELLITUS, TYPE II (01/02/2007), HYPERLIPIDEMIA (06/18/2007), HYPERTENSION (01/02/2007), RENAL INSUFFICIENCY (02/05/2008), Stroke ([place]), and Stroke ([place]) (2019).  He presents to the office today for follow-up regarding diabetes and hypertension  Diabetes mellitus type 2-is currently prescribed Starlix 60 mg daily and Lantus 2 units in the a.m. and 9 units in the p.m.  He does monitor his blood sugars at home and reports readings in the 100's-200's depending on what he is eating.   His last A1c was 6.5.  Lab Results  Component Value Date   HGBA1C 6.5 01/08/2021   Hypertension with chronic kidney disease-managed by nephrology.  Currently prescribed hydralazine 50 mg 3 times daily, Norvasc 5 mg daily, valsartan 160 mg daily, and labetalol 200 mg 3 times daily.  He was last seen on 04/16/2021.  At this time creatinine was 2.83 and GFR 23, up a little bit from his baseline of roughly 2.4  Review of Systems See HPI   Past Medical History:  Diagnosis Date   Anemia    DIABETES MELLITUS, TYPE II 01/02/2007   HYPERLIPIDEMIA 06/18/2007   HYPERTENSION 01/02/2007   RENAL INSUFFICIENCY 02/05/2008   Stroke ([place])    Stroke [facility]) 2019    Social History   Socioeconomic History   Marital status: Married    Spouse name: Not on file   Number of children: Not on file   Years of education: Not on file   Highest education level: Not on file  Occupational History   Not on file  Tobacco Use   Smoking status: Never   Smokeless tobacco: Never  Vaping Use   Vaping Use: Never used  Substance and Sexual Activity   Alcohol use: No   Drug use: No   Sexual activity: Not on file  Other Topics Concern   Not on file  Social History Narrative   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 Likes to go to football games.    Social Determinants of Health   Financial Resource Strain: Low Risk    Difficulty of Paying Living Expenses: Not hard at all  Food Insecurity: No Food Insecurity   Worried About Charity fundraiser in the Last Year: Never true   [place] in the Last Year: Never true  Transportation Needs: No Transportation Needs   Lack of Transportation (Medical): No   Lack of Transportation (Non-Medical): No  Physical Activity: Inactive   Days of Exercise per Week: 0 days   Minutes of Exercise per Session: 0 min  Stress: No Stress Concern Present   Feeling of Stress : Not at all  Social Connections: Moderately Integrated   Frequency of Communication with Friends and Family: More than three times a week   Frequency of Social Gatherings with Friends and Family: Once a week   Attends Religious Services: Never   Marine scientist or Organizations: Yes   Attends Archivist Meetings: Never   Marital Status: Married  Human resources officer Violence: Not At Risk   Fear of Current or Ex-Partner: No   Emotionally Abused: No   Physically Abused: No   Sexually Abused:  No    Past Surgical History:  Procedure Laterality Date   LOOP RECORDER INSERTION N/A 07/11/2017   Procedure: LOOP RECORDER INSERTION;  Surgeon: Thompson Grayer, MD;  Location: [facility];  Service: Cardiovascular;  Laterality: N/A;   SHOULDER SURGERY     left   TEE WITHOUT CARDIOVERSION N/A 07/11/2017   Procedure: TRANSESOPHAGEAL ECHOCARDIOGRAM (TEE);  Surgeon: Dorothy Spark, MD;  Location[facility] ENDOSCOPY;  Service: Cardiovascular;  Laterality: N/A;   TRANSURETHRAL RESECTION OF PROSTATE     history of retention/hematuria     Family History  Problem Relation Age of Onset   Hypertension Mother    Diabetes Mother    Stroke Father    Stroke Maternal Grandmother    Colon cancer Neg Hx    Esophageal cancer Neg Hx    Rectal  cancer Neg Hx    Stomach cancer Neg Hx     Allergies  Allergen Reactions   Evolocumab Hypertension    Other reaction(s): Other (see comments)   Bactrim [Sulfamethoxazole-Trimethoprim] Hives, Itching and Swelling   Penicillins Hives    Has patient had a PCN reaction causing immediate rash, facial/tongue/throat swelling, SOB or lightheadedness with hypotension: Unk Has patient had a PCN reaction causing severe rash involving mucus membranes or skin necrosis: Unk Has patient had a PCN reaction that required hospitalization: Unk Has patient had a PCN reaction occurring within the last 10 years: No If all of the above answers are "NO", then may proceed with Cephalosporin use. Other reaction(s): Other (see comments)   Statins     myalgia Other reaction(s): Other (see comments)   Sulfa Antibiotics     Other reaction(s): Other (see comments)   Sulfa Drugs Cross Reactors Hives, Itching and Swelling   Sulfamethoxazole-Trimethoprim Hives and Itching    Other reaction(s): Other (see comments)    Current Outpatient Medications on File Prior to Visit  Medication Sig Dispense Refill   amLODipine (NORVASC) 5 MG tablet Take 5 mg by mouth in the morning and at bedtime.     aspirin 325 MG tablet Take 325 mg by mouth daily.     B Complex Vitamins (VITAMIN B COMPLEX) TABS Take by mouth.     Blood Glucose Monitoring Suppl (ONETOUCH VERIO) w/Device KIT Use with existing test strips 1 kit kit   brimonidine (ALPHAGAN) 0.2 % ophthalmic solution Place 1 drop into both eyes 2 (two) times daily.     calcitRIOL (ROCALTROL) 0.25 MCG capsule Take 3 capsules by mouth daily.     Cholecalciferol 50 MCG (2000 UT) CAPS Take 1 capsule by mouth daily.     dorzolamide-timolol (COSOPT) 22.3-6.8 MG/ML ophthalmic solution Place 1 drop into both eyes 2 (two) times daily.     ezetimibe (ZETIA) 10 MG tablet TAKE 1 TABLET(10 MG) BY MOUTH DAILY 90 tablet 3   ferrous sulfate 325 (65 FE) MG tablet Take 325 mg by mouth 2 (two)  times daily with a meal.     hydrALAZINE (APRESOLINE) 50 MG tablet Take 50 mg by mouth 3 (three) times daily.     Insulin Pen Needle (B-D UF III MINI PEN NEEDLES) 31G X 5 MM MISC USE TO TEST BLOOD GLUCOSE FOUR TIMES DAILY 400 each 0   labetalol (NORMODYNE) 200 MG tablet TAKE 1 TABLET(200 MG) BY MOUTH THREE TIMES DAILY 270 tablet 3   LANTUS SOLOSTAR 100 UNIT/ML Solostar Pen ADMINISTER 7 TO 8 UNITS UNDER THE SKIN EVERY NIGHT AT BEDTIME 15 mL 2   nateglinide (STARLIX) 60 MG  tablet TAKE 1 TABLET BY MOUTH ONCE A DAY WITH MEALS     ONETOUCH VERIO test strip TEST TWICE DAILY AS DIRECTED 200 strip 2   valsartan (DIOVAN) 160 MG tablet Take 80 mg by mouth 2 (two) times daily.     No current facility-administered medications on file prior to visit.    BP 140/70 (BP Location: Left Arm, Patient Position: Sitting, Cuff Size: Normal)   Pulse 65   Temp 98.8 F (37.1 C) (Oral)   Wt 225 lb 9.6 oz (102.3 kg)   SpO2 99%   BMI 28.97 kg/m       Objective:   Physical Exam Vitals and nursing note reviewed.  Constitutional:      Appearance: Normal appearance.  Cardiovascular:     Rate and Rhythm: Normal rate and regular rhythm.     Pulses: Normal pulses.     Heart sounds: Normal heart sounds.  Musculoskeletal:        General: Normal range of motion.  Skin:    General: Skin is warm and dry.     Capillary Refill: Capillary refill takes less than 2 seconds.  Neurological:     General: No focal deficit present.     Mental Status: He is alert and oriented to person, place, and time.  Psychiatric:        Mood and Affect: Mood normal.        Behavior: Behavior normal.        Thought Content: Thought content normal.        Judgment: Judgment normal.      Assessment & Plan:  1. Diabetes mellitus type 2 in obese [facility]) - will try switching him from Lantus to [facility] for better contol - Cut back on sugars and carbs - Three month follow up or sooner if needed.  - Can titrate insulin by 1 unit each day  to get blood sugar at goal  - insulin glargine, 1 Unit Dial, (TOUJEO SOLOSTAR) 300 UNIT/ML Solostar Pen; Inject 8 Units into the skin daily.  Dispense: 3 mL; Refill: 3 - Hemoglobin A1c; Future - Basic Metabolic Panel; Future  2. Hypertensive kidney disease - Follow up with Nephrology as directed - BP near goal today  - Basic Metabolic Panel; Future  Dorothyann Peng, NP

## 2021-05-21 ENCOUNTER — Telehealth: Payer: Self-pay | Admitting: Adult Health

## 2021-05-21 NOTE — Telephone Encounter (Signed)
Pt wife is returning your call and want a call back.

## 2021-05-21 NOTE — Telephone Encounter (Signed)
Patient notified of update  and verbalized understanding. 

## 2021-06-04 ENCOUNTER — Telehealth: Payer: Self-pay | Admitting: Adult Health

## 2021-06-04 NOTE — Telephone Encounter (Signed)
Patient's wife [name] [name] called asking if a message could be sent back to [place]. She is wanting a call back regarding some issues she is having with her husband's medication.  She says that she is having some issues with the insurance company and is wanting to know if [name] can be given a sample of medication. She says that the insurance company is giving her contradicting statements and she says that she had this issue with them before.  [name] [name] would like a call at [phone].   Please advise.

## 2021-06-10 NOTE — Telephone Encounter (Signed)
Called number provided to speak with [name] but no answer.

## 2021-06-10 NOTE — Telephone Encounter (Signed)
[name] [name] returned call and advised that [name] [name] was costing pt over $100 to fill. [name] [name] stated that she cannot afford to pay for this medication. [name] [name] refused insulin pick up due to the cost. She called the insurance company and they advised that it would be cheaper if she filled 3 months at a time instead of 1 month. I called pharmacist and they advised that they fill 1 pen at a time bc pt was not able to afford 3 months supply. Pt co pay is $35 per pen. Pharmacist also stated that they pay the same amount for the lantus. [name] [name] is wanting to know if any samples could be given at this time.

## 2021-06-15 NOTE — Telephone Encounter (Signed)
[name] notified of update

## 2021-06-16 ENCOUNTER — Other Ambulatory Visit: Payer: Self-pay

## 2021-06-16 ENCOUNTER — Encounter (INDEPENDENT_AMBULATORY_CARE_PROVIDER_SITE_OTHER): Payer: Medicare Other | Admitting: Ophthalmology

## 2021-06-16 DIAGNOSIS — H34232 Retinal artery branch occlusion, left eye: Secondary | ICD-10-CM

## 2021-06-16 DIAGNOSIS — H33301 Unspecified retinal break, right eye: Secondary | ICD-10-CM

## 2021-06-16 DIAGNOSIS — H35033 Hypertensive retinopathy, bilateral: Secondary | ICD-10-CM

## 2021-06-16 DIAGNOSIS — H2513 Age-related nuclear cataract, bilateral: Secondary | ICD-10-CM

## 2021-06-16 DIAGNOSIS — I10 Essential (primary) hypertension: Secondary | ICD-10-CM

## 2021-06-16 DIAGNOSIS — E113293 Type 2 diabetes mellitus with mild nonproliferative diabetic retinopathy without macular edema, bilateral: Secondary | ICD-10-CM | POA: Diagnosis not present

## 2021-06-16 DIAGNOSIS — H43813 Vitreous degeneration, bilateral: Secondary | ICD-10-CM

## 2021-06-23 ENCOUNTER — Telehealth: Payer: Self-pay | Admitting: Pharmacist

## 2021-06-23 NOTE — Chronic Care Management (AMB) (Signed)
Chronic Care Management Pharmacy Assistant   Name: [name] [name]  MRN: [id number] DOB: [birth year]  Reason for Encounter: Disease State / Diabetes Assessment Call   Conditions to be addressed/monitored: DMII  Recent office visits:  05/20/2021 Dorothyann Peng NP - Patient was seen for Diabetes mellitus type 2 in obese and additional issues. Changed Insulin Glargine 100un/ml to 8 units daily. No follow up noted.  Recent consult visits:  04/06/2021 Adegoroye, Wynona Luna, MD (Nephrology) - Patient was seen for chronic kidney disease stage 4. Discontinued Alphagan P and Amlodipine. No follow up noted.  Hospital visits:  None  Medications: Outpatient Encounter Medications as of 06/23/2021  Medication Sig   amLODipine (NORVASC) 5 MG tablet Take 5 mg by mouth in the morning and at bedtime.   aspirin 325 MG tablet Take 325 mg by mouth daily.   B Complex Vitamins (VITAMIN B COMPLEX) TABS Take by mouth.   Blood Glucose Monitoring Suppl (ONETOUCH VERIO) w/Device KIT Use with existing test strips   brimonidine (ALPHAGAN) 0.2 % ophthalmic solution Place 1 drop into both eyes 2 (two) times daily.   calcitRIOL (ROCALTROL) 0.25 MCG capsule Take 3 capsules by mouth daily.   Cholecalciferol 50 MCG (2000 UT) CAPS Take 1 capsule by mouth daily.   dorzolamide-timolol (COSOPT) 22.3-6.8 MG/ML ophthalmic solution Place 1 drop into both eyes 2 (two) times daily.   ezetimibe (ZETIA) 10 MG tablet TAKE 1 TABLET(10 MG) BY MOUTH DAILY   ferrous sulfate 325 (65 FE) MG tablet Take 325 mg by mouth 2 (two) times daily with a meal.   hydrALAZINE (APRESOLINE) 50 MG tablet Take 50 mg by mouth 3 (three) times daily.   insulin glargine, 1 Unit Dial, (TOUJEO SOLOSTAR) 300 UNIT/ML Solostar Pen Inject 8 Units into the skin daily.   Insulin Pen Needle (B-D UF III MINI PEN NEEDLES) 31G X 5 MM MISC USE TO TEST BLOOD GLUCOSE FOUR TIMES DAILY   labetalol (NORMODYNE) 200 MG tablet TAKE 1 TABLET(200 MG) BY MOUTH THREE TIMES  DAILY   nateglinide (STARLIX) 60 MG tablet TAKE 1 TABLET BY MOUTH ONCE A DAY WITH MEALS   ONETOUCH VERIO test strip TEST TWICE DAILY AS DIRECTED   valsartan (DIOVAN) 160 MG tablet Take 80 mg by mouth 2 (two) times daily.   No facility-administered encounter medications on file as of 06/23/2021.  Fill History: BRIMONIDINE TARTRATE  0.2 % SOLN 06/16/2021 90   DORZOLAMIDE HCL/TIMOLOL MALEATE  22.3-6.8 MG/ML SOLN 02/17/2021 90   EZETIMIBE 10MG  TABLETS 05/18/2021 90   TOUJEO SOLOSTAR  300 UNIT/ML SOPN 06/03/2021 81   LABETALOL HYDROCHLORIDE  200 MG TABS 06/16/2021 90   NATEGLINIDE 60MG  TABLETS 06/16/2021 90   VALSARTAN 160MG  TABLETS 05/17/2021 90   AMLODIPINE BESYLATE 5MG  TABLETS 05/17/2021 90   HYDRALAZINE HYDROCHLORIDE  50 MG TABS 06/16/2021 90   Recent Relevant Labs: Lab Results  Component Value Date/Time   HGBA1C 7.2 (H) 05/20/2021 10:09 AM   HGBA1C 6.5 01/08/2021 08:18 AM   HGBA1C 6.5 06/19/2018 08:43 AM   HGBA1C 5.5 03/21/2018 07:14 AM   MICROALBUR 2.2 (H) 02/13/2014 09:53 AM   MICROALBUR 2.4 (H) 09/30/2013 09:50 AM    Kidney Function Lab Results  Component Value Date/Time   CREATININE 2.70 (H) 05/20/2021 10:09 AM   CREATININE 2.64 (H) 01/08/2021 08:18 AM   CREATININE 2.80 (H) 08/07/2020 08:53 AM   CREATININE 2.52 (H) 01/15/2020 08:11 AM   CREATININE 2.77 (H) 04/03/2019 11:06 AM   GFR 22.52 (L) 05/20/2021 10:09 AM  GFRNONAA 23 (L) 08/07/2020 08:53 AM   GFRAA 30 05/24/2019 12:00 AM   GFRAA 25 (L) 04/03/2019 11:06 AM    Current antihyperglycemic regimen:  Toujeo Solostar 300un/ml 8 units daily Starlix 60 mg daily  What recent interventions/DTPs have been made to improve glycemic control:  Patient started Toujeo 06/23/2021 in the AM (they had some issues getting it approved at the pharmacy)  Have there been any recent hospitalizations or ED visits since last visit with CPP? No recent hospital visits  How is patients anxiety? Patients wife feels it is getting a  little worse, she describes it as a subtle frustration. She states patient is sleeping more during the day and waking up earlier between 5 and 6 AM. An appointment was scheduled for 08/30/2020  Patients wife reports hypoglycemic symptoms, including Shaky  Patients wife reports hyperglycemic symptoms, including fatigue  How often are you checking your blood sugar? Patients wife is checking twice daily  What are your blood sugars ranging? His readings for the past week have been  Fasting: 139,120,147,140,132,156 Bedtime: 202, 203, 166, 150, 182  During the week, how often does your blood glucose drop below 70? Wife states no readings below 70.  Are you checking your feet daily/regularly? Wife is checking his feet daily and he goes to the podiatrist regularly.  Adherence Review: Is the patient currently on a STATIN medication? Yes Is the patient currently on ACE/ARB medication? No Does the patient have >5 day gap between last estimated fill dates? No  Care Gaps: AWV - scheduled for 08/16/21 Last BP - 140/70 on 05/20/2021 Last A1C - 7.2 05/20/2021 Zoster vaccines - never done Covid-19 vaccine - overdue  Fecal DNA -overdue since 4/22 Flu vaccine - due  Star Rating Drugs: Nateglinide 60mg  - last filled on 06/16/21 90DS at [facility] 160mg  - last filled on 05/17/21 90DS at [facility] [phone]

## 2021-07-02 ENCOUNTER — Telehealth: Payer: Self-pay | Admitting: Adult Health

## 2021-07-02 NOTE — Telephone Encounter (Signed)
Per [name]'s note I advised [name] pt spouse (DPR) to titrate 1 unit of insulin daily until pt is able to reach goal and to f/u with readings next week. [name] advised if it worsens over the weekend to seek help. [name] verbalized understanding.

## 2021-07-02 NOTE — Telephone Encounter (Signed)
Pt wife call stated pt blood sugar was 260 last night and it stay elevated and she want a call back to tell her want to do.

## 2021-07-02 NOTE — Telephone Encounter (Signed)
FYI

## 2021-07-13 ENCOUNTER — Encounter (INDEPENDENT_AMBULATORY_CARE_PROVIDER_SITE_OTHER): Payer: Medicare Other | Admitting: Ophthalmology

## 2021-07-27 DIAGNOSIS — H35033 Hypertensive retinopathy, bilateral: Secondary | ICD-10-CM | POA: Diagnosis not present

## 2021-07-27 DIAGNOSIS — H353131 Nonexudative age-related macular degeneration, bilateral, early dry stage: Secondary | ICD-10-CM | POA: Diagnosis not present

## 2021-07-27 DIAGNOSIS — H0102A Squamous blepharitis right eye, upper and lower eyelids: Secondary | ICD-10-CM | POA: Diagnosis not present

## 2021-07-27 DIAGNOSIS — H34212 Partial retinal artery occlusion, left eye: Secondary | ICD-10-CM | POA: Diagnosis not present

## 2021-07-27 DIAGNOSIS — H40023 Open angle with borderline findings, high risk, bilateral: Secondary | ICD-10-CM | POA: Diagnosis not present

## 2021-07-27 DIAGNOSIS — H0102B Squamous blepharitis left eye, upper and lower eyelids: Secondary | ICD-10-CM | POA: Diagnosis not present

## 2021-07-27 DIAGNOSIS — H2513 Age-related nuclear cataract, bilateral: Secondary | ICD-10-CM | POA: Diagnosis not present

## 2021-07-29 ENCOUNTER — Other Ambulatory Visit: Payer: Self-pay | Admitting: Adult Health

## 2021-07-29 DIAGNOSIS — E119 Type 2 diabetes mellitus without complications: Secondary | ICD-10-CM

## 2021-08-16 ENCOUNTER — Ambulatory Visit (INDEPENDENT_AMBULATORY_CARE_PROVIDER_SITE_OTHER): Payer: Medicare Other

## 2021-08-16 VITALS — Ht 75.0 in | Wt 225.0 lb

## 2021-08-16 DIAGNOSIS — Z0000 Encounter for general adult medical examination without abnormal findings: Secondary | ICD-10-CM

## 2021-08-16 NOTE — Progress Notes (Signed)
Subjective:   [name] [name] is a 75 y.o. male who presents for Medicare Annual/Subsequent preventive examination.  Review of Systems    Virtual Visit via Telephone Note  I connected with  [name] [name] on 08/16/21 at  9:45 AM EST by telephone and verified that I am speaking with the correct person using two identifiers.  Location: Patient: Home Provider: Office Persons participating in the virtual visit: patient/Nurse Health Advisor   I discussed the limitations, risks, security and privacy concerns of performing an evaluation and management service by telephone and the availability of in person appointments. The patient expressed understanding and agreed to proceed.  Interactive audio and video telecommunications were attempted between this nurse and patient, however failed, due to patient having technical difficulties OR patient did not have access to video capability.  We continued and completed visit with audio only.  Some vital signs may be absent or patient reported.   [name], LPN  Cardiac Risk Factors include: advanced age (>13mn, >>34women);diabetes mellitus;male gender    Objective:    Today's Vitals   08/16/21 0851 08/16/21 0940  Weight: 225 lb (102.1 kg)   Height: _0  (1.905 m)   PainSc:  7    Body mass index is 28.12 kg/m.  Advanced Directives 08/16/2021 08/13/2020 08/07/2020 02/18/2020 02/06/2020 08/02/2019 03/20/2019  Does Patient Have a Medical Advance Directive? _1  Yes Yes  Type of AParamedicof ABowling GreenLiving will [place]Living will HFre[place]Living will - HStinesvilleLiving will HLake TanglewoodLiving will HWoodland HillsLiving will  Does patient want to make changes to medical advance directive? No - Patient declined No - Patient declined - - No - Patient declined No - Patient declined No - Patient declined  Copy of  [place] Chart? No - copy requested No - copy requested - - - No - copy requested -    Current Medications (verified) Outpatient Encounter Medications as of 08/16/2021  Medication Sig   amLODipine (NORVASC) 5 MG tablet Take 5 mg by mouth in the morning and at bedtime.   aspirin 325 MG tablet Take 325 mg by mouth daily.   B Complex Vitamins (VITAMIN B COMPLEX) TABS Take by mouth.   Blood Glucose Monitoring Suppl (ONETOUCH VERIO) w/Device KIT Use with existing test strips   brimonidine (ALPHAGAN) 0.2 % ophthalmic solution Place 1 drop into both eyes 2 (two) times daily.   calcitRIOL (ROCALTROL) 0.25 MCG capsule Take 3 capsules by mouth daily.   Cholecalciferol 50 MCG (2000 UT) CAPS Take 1 capsule by mouth daily.   dorzolamide-timolol (COSOPT) 22.3-6.8 MG/ML ophthalmic solution Place 1 drop into both eyes 2 (two) times daily.   ezetimibe (ZETIA) 10 MG tablet TAKE 1 TABLET(10 MG) BY MOUTH DAILY   ferrous sulfate 325 (65 FE) MG tablet Take 325 mg by mouth 2 (two) times daily with a meal.   hydrALAZINE (APRESOLINE) 50 MG tablet Take 50 mg by mouth 3 (three) times daily.   insulin glargine, 1 Unit Dial, (TOUJEO SOLOSTAR) 300 UNIT/ML Solostar Pen Inject 8 Units into the skin daily.   Insulin Pen Needle (B-D UF III MINI PEN NEEDLES) 31G X 5 MM MISC USE TO TEST BLOOD GLUCOSE FOUR TIMES DAILY   labetalol (NORMODYNE) 200 MG tablet TAKE 1 TABLET(200 MG) BY MOUTH THREE TIMES DAILY   nateglinide (STARLIX) 60 MG tablet TAKE 1 TABLET BY MOUTH ONCE A  DAY WITH MEALS   ONETOUCH VERIO test strip USE TO TEST TWICE DAILY AS DIRECTED   valsartan (DIOVAN) 160 MG tablet Take 80 mg by mouth 2 (two) times daily.   No facility-administered encounter medications on file as of 08/16/2021.    Allergies (verified) Evolocumab, Bactrim [sulfamethoxazole-trimethoprim], Penicillins, Statins, Sulfa antibiotics, Sulfa drugs cross reactors, and Sulfamethoxazole-trimethoprim   History: Past Medical  History:  Diagnosis Date   Anemia    DIABETES MELLITUS, TYPE II 01/02/2007   HYPERLIPIDEMIA 06/18/2007   HYPERTENSION 01/02/2007   RENAL INSUFFICIENCY 02/05/2008   Stroke ([place])    Stroke ([place]) 2019   Past Surgical History:  Procedure Laterality Date   LOOP RECORDER INSERTION N/A 07/11/2017   Procedure: LOOP RECORDER INSERTION;  Surgeon: Thompson Grayer, MD;  Location: [facility];  Service: Cardiovascular;  Laterality: N/A;   SHOULDER SURGERY     left   TEE WITHOUT CARDIOVERSION N/A 07/11/2017   Procedure: TRANSESOPHAGEAL ECHOCARDIOGRAM (TEE);  Surgeon: Dorothy Spark, MD;  Location: [facility];  Service: Cardiovascular;  Laterality: N/A;   TRANSURETHRAL RESECTION OF PROSTATE     history of retention/hematuria    Family History  Problem Relation Age of Onset   Hypertension Mother    Diabetes Mother    Stroke Father    Stroke Maternal Grandmother    Colon cancer Neg Hx    Esophageal cancer Neg Hx    Rectal cancer Neg Hx    Stomach cancer Neg Hx    Social History   Socioeconomic History   Marital status: Married    Spouse name: Not on file   Number of children: Not on file   Years of education: Not on file   Highest education level: Not on file  Occupational History   Not on file  Tobacco Use   Smoking status: Never   Smokeless tobacco: Never  Vaping Use   Vaping Use: Never used  Substance and Sexual Activity   Alcohol use: No   Drug use: No   Sexual activity: Not on file  Other Topics Concern   Not on file  Social History Narrative   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 Likes to go to football games.    Social Determinants of Health   Financial Resource Strain: Low Risk    Difficulty of Paying Living Expenses: Not hard at all  Food Insecurity: No Food Insecurity   Worried About Charity fundraiser in the Last Year: Never true   [place] in the Last Year: Never true  Transportation Needs: No Transportation Needs   Lack of Transportation (Medical): No   Lack of Transportation (Non-Medical): No  Physical Activity: Inactive   Days of Exercise per Week: 0 days   Minutes of Exercise per Session: 0 min  Stress: No Stress Concern Present   Feeling of Stress : Only a little  Social Connections: Engineer, building services of Communication with Friends and Family: More than three times a week   Frequency of Social Gatherings with Friends and Family: More than three times a week   Attends Religious Services: More than 4 times per year   Active Member of [employer] or Organizations: Yes   Attends Music therapist: More than 4 times per year   Marital Status: Married  Tobacco Counseling Counseling given: Not Answered   Clinical Intake:   Diabetic? Yes Nutrition Risk Assessment:  Has the patient had any N/V/D within the last 2 months?  No  Does the patient have any non-healing wounds?  No  Has the patient had any unintentional weight loss or weight gain?  No   Diabetes:  Is the patient diabetic?  Yes  If diabetic, was a CBG obtained today?  Yes CBG 174 taken by patient Did the patient bring in their glucometer from home?  No  Audio Visit How often do you monitor your CBG's?  Daily.   Financial Strains and Diabetes Management:  Are you having any financial strains with the device, your supplies or your medication? No .  Does the patient want to be seen by Chronic Care Management for management of their diabetes?  No  Would the patient like to be referred to a Nutritionist or for Diabetic Management?  No   Diabetic Exams:  Diabetic Eye Exam: Completed Yes. Overdue for diabetic eye exam. Pt has been advised about the importance in completing this exam. A referral has been placed today. Message sent to referral coordinator for scheduling purposes. Advised pt to expect a call from office referred to  regarding appt.  Diabetic Foot Exam: Completed Yes. Pt has been advised about the importance in completing this exam. Pt is scheduled for diabetic foot exam on Followed by PCP  Interpreter Needed?: No   Activities of Daily Living In your present state of health, do you have any difficulty performing the following activities: 08/16/2021  Hearing? N  Vision? N  Difficulty concentrating or making decisions? N  Walking or climbing stairs? N  Dressing or bathing? N  Doing errands, shopping? N  Preparing Food and eating ? N  Using the Toilet? N  In the past six months, have you accidently leaked urine? N  Do you have problems with loss of bowel control? N  Managing your Medications? N  Managing your Finances? N  Housekeeping or managing your Housekeeping? N  Some recent data might be hidden    Patient Care Team: Dorothyann Peng, NP as PCP - General (Family Medicine) Adegoroye, Wynona Luna, MD (Nephrology) Viona Gilmore, [facility] as Pharmacist (Pharmacist)  Indicate any recent Medical Services you may have received from other than [facility] providers in the past year (date may be approximate).     Assessment:   This is a routine wellness examination for [facility].  Hearing/Vision screen Hearing Screening - Comments:: No difficulty hearing Vision Screening - Comments:: Wears glasses. Followed by Dr Katy Fitch  Dietary issues and exercise activities discussed: Current Exercise Habits: The patient does not participate in regular exercise at present, Exercise limited by: None identified   Goals Addressed   None    Depression Screen PHQ 2/9 Scores 08/16/2021 08/13/2020 01/08/2020 01/08/2020 01/03/2019 04/06/2018 03/15/2018  PHQ - 2 Score 1 0 0 0 0 0 0  PHQ- 9 Score - - - - - - -    Fall Risk Fall Risk  08/16/2021 08/13/2020 01/08/2020 01/08/2020 01/03/2019  Falls in the past year? 0 0 1 1 0  Number falls in past yr: 0 0 0 0 -  Injury with Fall? 0 0 0 - -  Risk for fall due to : No Fall Risks No Fall Risks -  - -  Follow up - Falls evaluation completed;Falls prevention discussed - - -    FALL RISK PREVENTION PERTAINING TO THE HOME:  Any stairs in  or around the home? Yes  If so, are there any without handrails? No  Home free of loose throw rugs in walkways, pet beds, electrical cords, etc? Yes  Adequate lighting in your home to reduce risk of falls? Yes   ASSISTIVE DEVICES UTILIZED TO PREVENT FALLS:  Life alert? No  Use of a cane, walker or w/c? No  Grab bars in the bathroom? No  Shower chair or bench in shower? No  Elevated toilet seat or a handicapped toilet? No   TIMED UP AND GO:  Was the test performed? No . Audio Visit    Cognitive Function:     6CIT Screen 08/16/2021  What Year? 0 points  What month? 0 points  What time? 0 points  Count back from 20 0 points  Months in reverse 0 points  Repeat phrase 0 points  Total Score 0    Immunizations Immunization History  Administered Date(s) Administered   Influenza, High Dose Seasonal PF 06/19/2018   PFIZER(Purple Top)SARS-COV-2 Vaccination 01/18/2020, 02/08/2020, 05/21/2021   Pneumococcal Conjugate-13 01/03/2019   Pneumococcal Polysaccharide-23 12/20/2011   Td 11/13/2009    TDAP status: Up to date  Flu Vaccine status: Declined, Education has been provided regarding the importance of this vaccine but patient still declined. Advised may receive this vaccine at local pharmacy or Health Dept. Aware to provide a copy of the vaccination record if obtained from local pharmacy or Health Dept. Verbalized acceptance and understanding.  Pneumococcal vaccine status: Up to date  Covid-19 vaccine status: Completed vaccines  Qualifies for Shingles Vaccine? Yes   Zostavax completed No   Shingrix Completed?: No.    Education has been provided regarding the importance of this vaccine. Patient has been advised to call insurance company to determine out of pocket expense if they have not yet received this vaccine. Advised may also  receive vaccine at local pharmacy or Health Dept. Verbalized acceptance and understanding.  Screening Tests Health Maintenance  Topic Date Due   Fecal DNA (Cologuard)  10/31/2020   COVID-19 Vaccine (4 - Booster for Pfizer series) 07/16/2021   INFLUENZA VACCINE  10/01/2021 (Originally 02/01/2021)   Zoster Vaccines- Shingrix (1 of 2) 11/13/2021 (Originally 11/26/1964)   Pneumonia Vaccine 25+ Years old  Completed   Hepatitis C Screening  Completed   HPV VACCINES  Aged Out    Health Maintenance  Health Maintenance Due  Topic Date Due   Fecal DNA (Cologuard)  10/31/2020   COVID-19 Vaccine (4 - Booster for Pfizer series) 07/16/2021    Colorectal cancer screening: Type of screening: Cologuard. Completed 4/30/. Repeat every 10 years  Lung Cancer Screening: (Low Dose CT Chest recommended if Age 46-80 years, 30 pack-year currently smoking OR have quit w/in 15years.) does not qualify.    Additional Screening:  Hepatitis C Screening: does not qualify; Completed 01/03/19  Vision Screening: Recommended annual ophthalmology exams for early detection of glaucoma and other disorders of the eye. Is the patient up to date with their annual eye exam?  Yes  Who is the provider or what is the name of the office in which the patient attends annual eye exams? Dr Katy Fitch If pt is not established with a provider, would they like to be referred to a provider to establish care? No   Dental Screening: Recommended annual dental exams for proper oral hygiene  Community Resource Referral / Chronic Care Management:  CRR required this visit?  No   CCM required this visit?  No      Plan:  I have personally reviewed and noted the following in the patients chart:   Medical and social history Use of alcohol, tobacco or illicit drugs  Current medications and supplements including opioid prescriptions. Patient is not currently taking opioid prescriptions. Functional ability and status Nutritional  status Physical activity Advanced directives List of other physicians Hospitalizations, surgeries, and ER visits in previous 12 months Vitals Screenings to include cognitive, depression, and falls Referrals and appointments  In addition, I have reviewed and discussed with patient certain preventive protocols, quality metrics, and best practice recommendations. A written personalized care plan for preventive services as well as general preventive health recommendations were provided to patient.     [name], LPN   [id number]   Nurse Notes: None

## 2021-08-16 NOTE — Patient Instructions (Signed)
[name] [name] , Thank you for taking time to come for your Medicare Wellness Visit. I appreciate your ongoing commitment to your health goals. Please review the following plan we discussed and let me know if I can assist you in the future.   These are the goals we discussed:  Goals      Exercise 3x per week (30 min per time)     Weight (lb) < 220 lb (99.8 kg)        This is a list of the screening recommended for you and due dates:  Health Maintenance  Topic Date Due   Cologuard (Stool DNA test)  10/31/2020   COVID-19 Vaccine (4 - Booster for Pfizer series) 07/16/2021   Flu Shot  10/01/2021*   Zoster (Shingles) Vaccine (1 of 2) 11/13/2021*   Pneumonia Vaccine  Completed   Hepatitis C Screening: USPSTF Recommendation to screen - Ages 25-79 yo.  Completed   HPV Vaccine  Aged Out  *Topic was postponed. The date shown is not the original due date.     Advanced directives: Yes Patient will bring copy  Conditions/risks identified: None  Next appointment: Follow up in one year for your annual wellness visit.   Preventive Care 75 Years and Older, Male Preventive care refers to lifestyle choices and visits with your health care provider that can promote health and wellness. What does preventive care include? A yearly physical exam. This is also called an annual well check. Dental exams once or twice a year. Routine eye exams. Ask your health care provider how often you should have your eyes checked. Personal lifestyle choices, including: Daily care of your teeth and gums. Regular physical activity. Eating a healthy diet. Avoiding tobacco and drug use. Limiting alcohol use. Practicing safe sex. Taking low doses of aspirin every day. Taking vitamin and mineral supplements as recommended by your health care provider. What happens during an annual well check? The services and screenings done by your health care provider during your annual well check will depend on your age, overall  health, lifestyle risk factors, and family history of disease. Counseling  Your health care provider may ask you questions about your: Alcohol use. Tobacco use. Drug use. Emotional well-being. Home and relationship well-being. Sexual activity. Eating habits. History of falls. Memory and ability to understand (cognition). Work and work Statistician. Screening  You may have the following tests or measurements: Height, weight, and BMI. Blood pressure. Lipid and cholesterol levels. These may be checked every 5 years, or more frequently if you are over 46 years old. Skin check. Lung cancer screening. You may have this screening every year starting at age 75 if you have a 30-pack-year history of smoking and currently smoke or have quit within the past 15 years. Fecal occult blood test (FOBT) of the stool. You may have this test every year starting at age 75. Flexible sigmoidoscopy or colonoscopy. You may have a sigmoidoscopy every 5 years or a colonoscopy every 10 years starting at age 68. Prostate cancer screening. Recommendations will vary depending on your family history and other risks. Hepatitis C blood test. Hepatitis B blood test. Sexually transmitted disease (STD) testing. Diabetes screening. This is done by checking your blood sugar (glucose) after you have not eaten for a while (fasting). You may have this done every 1-3 years. Abdominal aortic aneurysm (AAA) screening. You may need this if you are a current or former smoker. Osteoporosis. You may be screened starting at age 28 if you are at  high risk. Talk with your health care provider about your test results, treatment options, and if necessary, the need for more tests. Vaccines  Your health care provider may recommend certain vaccines, such as: Influenza vaccine. This is recommended every year. Tetanus, diphtheria, and acellular pertussis (Tdap, Td) vaccine. You may need a Td booster every 10 years. Zoster vaccine. You may  need this after age 23. Pneumococcal 13-valent conjugate (PCV13) vaccine. One dose is recommended after age 20. Pneumococcal polysaccharide (PPSV23) vaccine. One dose is recommended after age 55. Talk to your health care provider about which screenings and vaccines you need and how often you need them. This information is not intended to replace advice given to you by your health care provider. Make sure you discuss any questions you have with your health care provider. Document Released: 07/17/2015 Document Revised: 03/09/2016 Document Reviewed: 04/21/2015 Elsevier Interactive Patient Education  2017 [employer] Prevention in the Home Falls can cause injuries. They can happen to people of all ages. There are many things you can do to make your home safe and to help prevent falls. What can I do on the outside of my home? Regularly fix the edges of walkways and driveways and fix any cracks. Remove anything that might make you trip as you walk through a door, such as a raised step or threshold. Trim any bushes or trees on the path to your home. Use bright outdoor lighting. Clear any walking paths of anything that might make someone trip, such as rocks or tools. Regularly check to see if handrails are loose or broken. Make sure that both sides of any steps have handrails. Any raised decks and porches should have guardrails on the edges. Have any leaves, snow, or ice cleared regularly. Use sand or salt on walking paths during winter. Clean up any spills in your garage right away. This includes oil or grease spills. What can I do in the bathroom? Use night lights. Install grab bars by the toilet and in the tub and shower. Do not use towel bars as grab bars. Use non-skid mats or decals in the tub or shower. If you need to sit down in the shower, use a plastic, non-slip stool. Keep the floor dry. Clean up any water that spills on the floor as soon as it happens. Remove soap buildup in  the tub or shower regularly. Attach bath mats securely with double-sided non-slip rug tape. Do not have throw rugs and other things on the floor that can make you trip. What can I do in the bedroom? Use night lights. Make sure that you have a light by your bed that is easy to reach. Do not use any sheets or blankets that are too big for your bed. They should not hang down onto the floor. Have a firm chair that has side arms. You can use this for support while you get dressed. Do not have throw rugs and other things on the floor that can make you trip. What can I do in the kitchen? Clean up any spills right away. Avoid walking on wet floors. Keep items that you use a lot in easy-to-reach places. If you need to reach something above you, use a strong step stool that has a grab bar. Keep electrical cords out of the way. Do not use floor polish or wax that makes floors slippery. If you must use wax, use non-skid floor wax. Do not have throw rugs and other things on the floor that  can make you trip. What can I do with my stairs? Do not leave any items on the stairs. Make sure that there are handrails on both sides of the stairs and use them. Fix handrails that are broken or loose. Make sure that handrails are as long as the stairways. Check any carpeting to make sure that it is firmly attached to the stairs. Fix any carpet that is loose or worn. Avoid having throw rugs at the top or bottom of the stairs. If you do have throw rugs, attach them to the floor with carpet tape. Make sure that you have a light switch at the top of the stairs and the bottom of the stairs. If you do not have them, ask someone to add them for you. What else can I do to help prevent falls? Wear shoes that: Do not have high heels. Have rubber bottoms. Are comfortable and fit you well. Are closed at the toe. Do not wear sandals. If you use a stepladder: Make sure that it is fully opened. Do not climb a closed  stepladder. Make sure that both sides of the stepladder are locked into place. Ask someone to hold it for you, if possible. Clearly mark and make sure that you can see: Any grab bars or handrails. First and last steps. Where the edge of each step is. Use tools that help you move around (mobility aids) if they are needed. These include: Canes. Walkers. Scooters. Crutches. Turn on the lights when you go into a dark area. Replace any light bulbs as soon as they burn out. Set up your furniture so you have a clear path. Avoid moving your furniture around. If any of your floors are uneven, fix them. If there are any pets around you, be aware of where they are. Review your medicines with your doctor. Some medicines can make you feel dizzy. This can increase your chance of falling. Ask your doctor what other things that you can do to help prevent falls. This information is not intended to replace advice given to you by your health care provider. Make sure you discuss any questions you have with your health care provider. Document Released: 04/16/2009 Document Revised: 11/26/2015 Document Reviewed: 07/25/2014 Elsevier Interactive Patient Education  2017 [employer].

## 2021-08-18 ENCOUNTER — Encounter: Payer: Self-pay | Admitting: Pharmacist

## 2021-08-18 DIAGNOSIS — G720 Drug-induced myopathy: Secondary | ICD-10-CM

## 2021-08-18 NOTE — Progress Notes (Signed)
[facility])                                            [place] Team                                        Statin Quality Measure Assessment    08/18/2021  [name] [name] 08-23-1945 [id number]    Per review of chart and payor information, patient has a diagnosis of diabetes but is not currently filling a statin prescription.  This places patient into the SUPD (Statin Use In Patients with Diabetes) measure for CMS.    Patient has documented trials of statin therapy  with reported myalgia, but no corresponding CPT codes that would exclude patient from SUPD measure.  Patient has an upcoming appointment on 02\16\23     Component Value Date/Time   CHOL 193 01/08/2021 0818   TRIG 115.0 01/08/2021 0818   TRIG 323 (HH) 06/13/2006 1050   HDL 44.10 01/08/2021 0818   CHOLHDL 4 01/08/2021 0818   VLDL 23.0 01/08/2021 0818   LDLCALC 125 (H) 01/08/2021 0818   LDLCALC 98 01/15/2020 0811   LDLDIRECT 180.7 06/24/2013 1106    Please consider ONE of the following recommendations:  Initiate high intensity statin Atorvastatin 40mg  once daily, #90, 3 refills   Rosuvastatin 20mg  once daily, #90, 3 refills    Initiate moderate intensity          statin with reduced frequency if prior          statin intolerance 1x weekly, #13, 3 refills   2x weekly, #26, 3 refills   3x weekly, #39, 3 refills    Code for past statin intolerance or  other exclusions (required annually)   Provider Requirements:  Associate code during an office visit or telehealth encounter  Drug Induced Myopathy G72.0   Myopathy, unspecified G72.9   Myositis, unspecified M60.9   Rhabdomyolysis I50.38   Alcoholic fatty liver U82.8   Cirrhosis of liver K74.69   Prediabetes R73.03   PCOS E28.2   Toxic liver disease, unspecified K71.9   Adverse effect of antihyperlipidemic and antiarteriosclerotic drugs, initial encounter M03.4J1P    Plan: Route note to  PCP  Elayne Guerin, PharmD, [place] Clinical Pharmacist [phone]

## 2021-08-19 ENCOUNTER — Ambulatory Visit (INDEPENDENT_AMBULATORY_CARE_PROVIDER_SITE_OTHER): Payer: Medicare Other | Admitting: Adult Health

## 2021-08-19 ENCOUNTER — Encounter: Payer: Self-pay | Admitting: Adult Health

## 2021-08-19 VITALS — BP 160/70 | HR 56 | Temp 98.6°F | Ht 75.0 in | Wt 230.0 lb

## 2021-08-19 DIAGNOSIS — E1122 Type 2 diabetes mellitus with diabetic chronic kidney disease: Secondary | ICD-10-CM

## 2021-08-19 DIAGNOSIS — I129 Hypertensive chronic kidney disease with stage 1 through stage 4 chronic kidney disease, or unspecified chronic kidney disease: Secondary | ICD-10-CM | POA: Diagnosis not present

## 2021-08-19 DIAGNOSIS — F32A Depression, unspecified: Secondary | ICD-10-CM

## 2021-08-19 DIAGNOSIS — N184 Chronic kidney disease, stage 4 (severe): Secondary | ICD-10-CM

## 2021-08-19 DIAGNOSIS — Z794 Long term (current) use of insulin: Secondary | ICD-10-CM

## 2021-08-19 LAB — POCT GLYCOSYLATED HEMOGLOBIN (HGB A1C): Hemoglobin A1C: 7.9 % — AB (ref 4.0–5.6)

## 2021-08-19 MED ORDER — LANTUS SOLOSTAR 100 UNIT/ML ~~LOC~~ SOPN: PEN_INJECTOR | SUBCUTANEOUS | 0 refills | Status: DC

## 2021-08-19 NOTE — Progress Notes (Signed)
Subjective:    Patient ID: [name] [name], male    DOB: 1946/06/07, 76 y.o.   MRN: [id number]  HPI 76 year old male who  has a past medical history of Anemia, DIABETES MELLITUS, TYPE II (01/02/2007), HYPERLIPIDEMIA (06/18/2007), HYPERTENSION (01/02/2007), RENAL INSUFFICIENCY (02/05/2008), Stroke (HCC), and Stroke (HCC) (2019).  He presents to the office today for follow-up regarding diabetes and hypertension  Diabetes mellitus type 2-is currently prescribed Starlix 60 mg daily and Lantus 9  units in the morning and 2  units in the evening.  He does monitor his blood sugars at home and reports readings in the 100s to 200s.  He denies episodes of hypoglycemia.  During his last visit his A1c had increased slightly from 6.5-7.2. he continues to be pretty sedentary throughout the day and is starting to feel weaker and have more gait instability - has not fallen   Lab Results  Component Value Date   HGBA1C 7.2 (H) 05/20/2021   Hypertension with chronic kidney disease-managed by nephrology.  Currently prescribed hydralazine 50 mg 3 times a day, Norvasc 5 mg daily, valsartan 160 mg daily, and labetalol 200 mg 3 times daily. Does report blood pressure have been up in the 160 systolic. He is going to see Nephrology tomorrow.  BP Readings from Last 3 Encounters:  08/19/21 (!) 160/70  05/20/21 140/70  01/08/21 (!) 160/84   Review of Systems See HPI   Past Medical History:  Diagnosis Date   Anemia    DIABETES MELLITUS, TYPE II 01/02/2007   HYPERLIPIDEMIA 06/18/2007   HYPERTENSION 01/02/2007   RENAL INSUFFICIENCY 02/05/2008   Stroke (HCC)    Stroke 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 Likes to go to football games.    Social Determinants of Health   Financial Resource Strain: Low Risk    Difficulty of Paying Living Expenses: Not hard at all  Food Insecurity: No Food Insecurity   Worried About Programme researcher, broadcasting/film/video in the Last Year: Never true   Ran Out of Food in the Last Year: Never true  Transportation Needs: No Transportation Needs   Lack of Transportation (Medical): No   Lack of Transportation (Non-Medical): No  Physical Activity: Inactive   Days of Exercise per Week: 0 days   Minutes of Exercise per Session: 0 min  Stress: No Stress Concern Present   Feeling of Stress : Only a little  Social Connections: Press photographer of Communication with Friends and Family: More than three times a week   Frequency of Social Gatherings with Friends and Family: More than three times a week   Attends Religious Services: More than 4  times per year   Active Member of Clubs or Organizations: Yes   Attends Archivist Meetings: More than 4 times per year   Marital Status: Married  Human resources officer Violence: Not At Risk   Fear of Current or Ex-Partner: No   Emotionally Abused: No   Physically Abused: No   Sexually Abused: No    Past Surgical History:  Procedure Laterality Date   LOOP RECORDER INSERTION N/A 07/11/2017   Procedure: LOOP RECORDER INSERTION;  Surgeon: Thompson Grayer, MD;  Location: [facility];  Service: Cardiovascular;  Laterality: N/A;   SHOULDER SURGERY     left   TEE WITHOUT CARDIOVERSION N/A 07/11/2017   Procedure: TRANSESOPHAGEAL ECHOCARDIOGRAM (TEE);  Surgeon: Dorothy Spark, MD;  Location: [facility] ENDOSCOPY;   Service: Cardiovascular;  Laterality: N/A;   TRANSURETHRAL RESECTION OF PROSTATE     history of retention/hematuria     Family History  Problem Relation Age of Onset   Hypertension Mother    Diabetes Mother    Stroke Father    Stroke Maternal Grandmother    Colon cancer Neg Hx    Esophageal cancer Neg Hx    Rectal cancer Neg Hx    Stomach cancer Neg Hx     Allergies  Allergen Reactions   Evolocumab Hypertension    Other reaction(s): Other (see comments)   Bactrim [Sulfamethoxazole-Trimethoprim] Hives, Itching and Swelling   Penicillins Hives    Has patient had a PCN reaction causing immediate rash, facial/tongue/throat swelling, SOB or lightheadedness with hypotension: Unk Has patient had a PCN reaction causing severe rash involving mucus membranes or skin necrosis: Unk Has patient had a PCN reaction that required hospitalization: Unk Has patient had a PCN reaction occurring within the last 10 years: No If all of the above answers are "NO", then may proceed with Cephalosporin use. Other reaction(s): Other (see comments)   Statins     myalgia Other reaction(s): Other (see comments)   Sulfa Antibiotics     Other reaction(s): Other (see comments)   Sulfa Drugs Cross Reactors Hives, Itching and Swelling   Sulfamethoxazole-Trimethoprim Hives and Itching    Other reaction(s): Other (see comments)    Current Outpatient Medications on File Prior to Visit  Medication Sig Dispense Refill   amLODipine (NORVASC) 5 MG tablet Take 5 mg by mouth in the morning and at bedtime.     aspirin 325 MG tablet Take 325 mg by mouth daily.     B Complex Vitamins (VITAMIN B COMPLEX) TABS Take by mouth.     Blood Glucose Monitoring Suppl (ONETOUCH VERIO) w/Device KIT Use with existing test strips 1 kit kit   brimonidine (ALPHAGAN) 0.2 % ophthalmic solution Place 1 drop into both eyes 2 (two) times daily.     calcitRIOL (ROCALTROL) 0.25 MCG capsule Take 3 capsules by mouth daily.      Cholecalciferol 50 MCG (2000 UT) CAPS Take 1 capsule by mouth daily.     dorzolamide-timolol (COSOPT) 22.3-6.8 MG/ML ophthalmic solution Place 1 drop into both eyes 2 (two) times daily.     ezetimibe (ZETIA) 10 MG tablet TAKE 1 TABLET(10 MG) BY MOUTH DAILY 90 tablet 3   ferrous sulfate 325 (65 FE) MG tablet Take 325 mg by mouth 2 (two) times daily with a meal.     hydrALAZINE (APRESOLINE) 50 MG tablet Take 50 mg by mouth 3 (three) times daily.     Insulin Pen Needle (B-D UF III MINI PEN NEEDLES) 31G X 5 MM MISC  USE TO TEST BLOOD GLUCOSE FOUR TIMES DAILY 400 each 0   labetalol (NORMODYNE) 200 MG tablet TAKE 1 TABLET(200 MG) BY MOUTH THREE TIMES DAILY 270 tablet 3   LANTUS SOLOSTAR 100 UNIT/ML Solostar Pen Inject into the skin.     nateglinide (STARLIX) 60 MG tablet TAKE 1 TABLET BY MOUTH ONCE A DAY WITH MEALS     ONETOUCH VERIO test strip USE TO TEST TWICE DAILY AS DIRECTED 200 strip 2   valsartan (DIOVAN) 160 MG tablet Take 80 mg by mouth 2 (two) times daily.     No current facility-administered medications on file prior to visit.    BP (!) 160/70    Pulse (!) 56    Temp 98.6 F (37 C) (Oral)    Ht $R'6\' 3"'zB$  (1.905 m)    Wt 230 lb (104.3 kg)    SpO2 98%    BMI 28.75 kg/m       Objective:   Physical Exam Vitals and nursing note reviewed.  Constitutional:      Appearance: Normal appearance.  Cardiovascular:     Rate and Rhythm: Normal rate and regular rhythm.     Pulses: Normal pulses.     Heart sounds: Normal heart sounds.  Pulmonary:     Effort: Pulmonary effort is normal.     Breath sounds: Normal breath sounds.  Skin:    General: Skin is warm and dry.     Capillary Refill: Capillary refill takes less than 2 seconds.  Neurological:     General: No focal deficit present.     Mental Status: He is alert and oriented to person, place, and time.  Psychiatric:        Mood and Affect: Mood normal.        Behavior: Behavior normal.        Thought Content: Thought content normal.         Judgment: Judgment normal.       Assessment & Plan:   1. Type 2 diabetes mellitus with stage 4 chronic kidney disease, with long-term current use of insulin (HCC)  - POC HgB A1c- 7.9 - has increased.  - will increase lantus evening dose from 2 units to 5 units.  - Encouraged getting back to the gym, water therapy would be great for him  - Follow up in three months  - LANTUS SOLOSTAR 100 UNIT/ML Solostar Pen; 9 units in the morning and 5 units in the evening  Dispense: 15 mL; Refill: 0  2. Hypertensive kidney disease - Elevated in the office today  - Follow up with nephrology   3. Depression, unspecified depression type - PHQ9 score 13  - Does not want to start medication   Dorothyann Peng, NP

## 2021-08-20 DIAGNOSIS — M1000 Idiopathic gout, unspecified site: Secondary | ICD-10-CM | POA: Diagnosis not present

## 2021-08-20 DIAGNOSIS — I10 Essential (primary) hypertension: Secondary | ICD-10-CM | POA: Diagnosis not present

## 2021-08-20 DIAGNOSIS — N184 Chronic kidney disease, stage 4 (severe): Secondary | ICD-10-CM | POA: Diagnosis not present

## 2021-08-20 DIAGNOSIS — E211 Secondary hyperparathyroidism, not elsewhere classified: Secondary | ICD-10-CM | POA: Diagnosis not present

## 2021-08-20 DIAGNOSIS — D649 Anemia, unspecified: Secondary | ICD-10-CM | POA: Diagnosis not present

## 2021-08-20 DIAGNOSIS — R309 Painful micturition, unspecified: Secondary | ICD-10-CM | POA: Diagnosis not present

## 2021-08-20 DIAGNOSIS — N189 Chronic kidney disease, unspecified: Secondary | ICD-10-CM | POA: Diagnosis not present

## 2021-08-20 DIAGNOSIS — E1169 Type 2 diabetes mellitus with other specified complication: Secondary | ICD-10-CM | POA: Diagnosis not present

## 2021-08-20 DIAGNOSIS — R809 Proteinuria, unspecified: Secondary | ICD-10-CM | POA: Diagnosis not present

## 2021-08-26 ENCOUNTER — Telehealth: Payer: Self-pay | Admitting: Pharmacist

## 2021-08-26 NOTE — Chronic Care Management (AMB) (Signed)
° ° °  Chronic Care Management Pharmacy Assistant   Name: [name] [name]  MRN: [id number] DOB: 25-Oct-1945  08/30/2021 APPOINTMENT [place] wife was reminded to have all medications, supplements and any blood glucose and blood pressure readings available for review with Jeni Salles, Pharm. D, at his telephone visit on 08/30/2021 at 1:30.   Questions: Have you had any recent office visit or specialist visit outside of [place]? Patients wife denies any visits outside of [facility].   Are there any concerns you would like to discuss during your office visit? Patients wife denies any new issues or concerns.  Are you having any problems obtaining your medications? (Whether it pharmacy issues or cost) Patients wife denies any issues getting medications.   If patient has any PAP medications ask if they are having any problems getting their PAP medication or refill? Patients wife denies any medications coming from PAP.  Care Gaps: AWV - scheduled for 08/16/21 Last BP - 160/70 on 08/19/2021 Last A1C - 7.9 on 08/19/2021 Covid-19 vaccine - overdue  Fecal DNA -overdue since 4/22  Star Rating Drug: Nateglinide 60mg  - last filled on 06/16/21 90DS at [facility] Valsartan 160mg  - last filled on 08/17/2021 90DS at [facility]  Any gaps in medications fill history? No  Gennie Alma [facility]  Catering manager [phone]

## 2021-08-30 ENCOUNTER — Ambulatory Visit (INDEPENDENT_AMBULATORY_CARE_PROVIDER_SITE_OTHER): Payer: Medicare Other | Admitting: Pharmacist

## 2021-08-30 DIAGNOSIS — Z794 Long term (current) use of insulin: Secondary | ICD-10-CM

## 2021-08-30 DIAGNOSIS — I129 Hypertensive chronic kidney disease with stage 1 through stage 4 chronic kidney disease, or unspecified chronic kidney disease: Secondary | ICD-10-CM

## 2021-08-30 DIAGNOSIS — G720 Drug-induced myopathy: Secondary | ICD-10-CM

## 2021-08-30 DIAGNOSIS — E1122 Type 2 diabetes mellitus with diabetic chronic kidney disease: Secondary | ICD-10-CM

## 2021-08-30 NOTE — Progress Notes (Signed)
Chronic Care Management Pharmacy Note  08/31/2021 Name:  [name] [name] MRN:  [id number] DOB:  07/05/1945  Summary: BP home readings are variable A1c at goal < 8% LDL not at goal < 70  Recommendations/Changes made from today's visit: -Recommended discussing with nephrologist about starting BP medication for PRN use with parameters -Recommended CGM for blood sugar monitoring -Consider the addition of bempedoic acid for LDL lowering  Plan: BP and DM follow up assessment in 1 month Consider in office visit for CGM instructions  Subjective: [name] [name] is an 76 y.o. year old male who is a primary patient of Shirline Frees, NP.  The CCM team was consulted for assistance with disease management and care coordination needs.    Engaged with patient by telephone for follow up visit in response to provider referral for pharmacy case management and/or care coordination services.   Consent to Services:  The patient was given information about Chronic Care Management services, agreed to services, and gave verbal consent prior to initiation of services.  Please see initial visit note for detailed documentation.   Patient Care Team: Shirline Frees, NP as PCP - General (Family Medicine) Adegoroye, Charlies Silvers, MD (Nephrology) Verner Chol, [facility] as Pharmacist (Pharmacist)  Recent office visits: 08/19/21 Shirline Frees, NP: Patient presented for DM follow up. Increased evening Lantus from 2 units to 5 units. Follow up in 3 months.  08/16/21 Theresa Mulligan, LPN: Patient presented for AWV.  05/20/2021 Shirline Frees NP - Patient was seen for Diabetes mellitus type 2 in obese and additional issues. Changed Insulin Glargine 100un/ml to 8 units daily. No follow up noted.  Recent consult visits: 08/20/21 Aaron Edelman, MD (nephrology): Patient presented for CKD. Increased hydralazine to 100 mg TID. Plan for sleep study. Follow up in 4 months.  04/06/2021 Adegoroye, Charlies Silvers, MD  (Nephrology) - Patient was seen for chronic kidney disease stage 4. Discontinued Alphagan P and Amlodipine. No follow up noted.  09/07/20 Unk Lightning, RN (cardiology): Patient presented for remote pacemaker device check.  08/07/20 Wyvonnia Lora, MD (oncology): Patient presented for iron deficiency anemia. Follow up PRN.  05/26/20 Wynona Luna (optometry): Patient presented for follow up. Unable to access notes.  05/15/20 Aaron Edelman, MD (nephrology): Patient presented for CKD.  05/04/20 Larey Dresser (podiatry): Patient presented for PVD and bunion history. Unable to access notes.    Hospital visits: None in previous 6 months  Objective:  Lab Results  Component Value Date   CREATININE 2.70 (H) 05/20/2021   BUN 43 (H) 05/20/2021   GFR 22.52 (L) 05/20/2021   GFRNONAA 23 (L) 08/07/2020   GFRAA 30 05/24/2019   NA 138 05/20/2021   K 4.3 05/20/2021   CALCIUM 9.2 05/20/2021   CO2 24 05/20/2021   GLUCOSE 188 (H) 05/20/2021    Lab Results  Component Value Date/Time   HGBA1C 7.9 (A) 08/19/2021 07:09 AM   HGBA1C 7.2 (H) 05/20/2021 10:09 AM   HGBA1C 6.5 01/08/2021 08:18 AM   HGBA1C 6.5 06/19/2018 08:43 AM   HGBA1C 5.5 03/21/2018 07:14 AM   GFR 22.52 (L) 05/20/2021 10:09 AM   GFR 23.19 (L) 01/08/2021 08:18 AM   MICROALBUR 2.2 (H) 02/13/2014 09:53 AM   MICROALBUR 2.4 (H) 09/30/2013 09:50 AM    Last diabetic Eye exam:  Lab Results  Component Value Date/Time   HMDIABEYEEXA No Retinopathy 03/24/2021 12:00 AM    Last diabetic Foot exam:  Lab Results  Component Value Date/Time   HMDIABFOOTEX done 09/30/2013 12:00 AM  Lab Results  Component Value Date   CHOL 193 01/08/2021   HDL 44.10 01/08/2021   LDLCALC 125 (H) 01/08/2021   LDLDIRECT 180.7 06/24/2013   TRIG 115.0 01/08/2021   CHOLHDL 4 01/08/2021    Hepatic Function Latest Ref Rng & Units 01/08/2021 08/07/2020 01/15/2020  Total Protein 6.0 - 8.3 g/dL 6.8 6.9 6.6  Albumin 3.5 - 5.2 g/dL 4.3 3.9 -  AST 0 - 37  U/L 13 14(L) 15  ALT 0 - 53 U/L $Remo'13 14 13  'WIwwU$ Alk Phosphatase 39 - 117 U/L 47 58 -  Total Bilirubin 0.2 - 1.2 mg/dL 0.4 0.4 0.4  Bilirubin, Direct 0.0 - 0.3 mg/dL - - -    Lab Results  Component Value Date/Time   TSH 2.43 01/08/2021 08:18 AM   TSH 2.83 01/15/2020 08:11 AM    CBC Latest Ref Rng & Units 01/08/2021 09/01/2020 08/07/2020  WBC 4.0 - 10.5 K/uL 7.6 6.9 7.2  Hemoglobin 13.0 - 17.0 g/dL 11.1(L) 13.0 12.0(L)  Hematocrit 39.0 - 52.0 % 33.5(L) 38.6(L) 35.8(L)  Platelets 150.0 - 400.0 K/uL 240.0 221.0 217    Lab Results  Component Value Date/Time   VD25OH 45 09/04/2019 12:00 AM   VD25OH 50.29 11/02/2018 08:16 AM   VD25OH 46 09/18/2018 12:00 AM   VD25OH 12.19 (L) 09/19/2017 10:00 AM    Clinical ASCVD: Yes  The ASCVD Risk score (Arnett DK, et al., 2019) failed to calculate for the following reasons:   The patient has a prior MI or stroke diagnosis    Depression screen [facility] 2/9 08/19/2021 08/16/2021 08/13/2020  Decreased Interest 2 0 0  Down, Depressed, Hopeless 1 1 0  PHQ - 2 Score 3 1 0  Altered sleeping 2 - -  Tired, decreased energy 2 - -  Change in appetite 3 - -  Feeling bad or failure about yourself  1 - -  Trouble concentrating 0 - -  Moving slowly or fidgety/restless 2 - -  Suicidal thoughts 0 - -  PHQ-9 Score 13 - -  Difficult doing work/chores Somewhat difficult - -  Some recent data might be hidden      Social History   Tobacco Use  Smoking Status Never  Smokeless Tobacco Never   BP Readings from Last 3 Encounters:  08/19/21 (!) 160/70  05/20/21 140/70  01/08/21 (!) 160/84   Pulse Readings from Last 3 Encounters:  08/19/21 (!) 56  05/20/21 65  01/08/21 (!) 55   Wt Readings from Last 3 Encounters:  08/19/21 230 lb (104.3 kg)  08/16/21 225 lb (102.1 kg)  05/20/21 225 lb 9.6 oz (102.3 kg)   BMI Readings from Last 3 Encounters:  08/19/21 28.75 kg/m  08/16/21 28.12 kg/m  05/20/21 28.97 kg/m    Assessment/Interventions: Review of patient past  medical history, allergies, medications, health status, including review of consultants reports, laboratory and other test data, was performed as part of comprehensive evaluation and provision of chronic care management services.   SDOH:  (Social Determinants of Health) assessments and interventions performed: No   SDOH Screenings   Alcohol Screen: Low Risk    Last Alcohol Screening Score (AUDIT): 0  Depression (PHQ2-9): Medium Risk   PHQ-2 Score: 13  Financial Resource Strain: Low Risk    Difficulty of Paying Living Expenses: Not hard at all  Food Insecurity: No Food Insecurity   Worried About Charity fundraiser in the Last Year: Never true   Ran Out of Food in the Last Year: Never true  Housing: Low Risk    Last Housing Risk Score: 0  Physical Activity: Inactive   Days of Exercise per Week: 0 days   Minutes of Exercise per Session: 0 min  Social Connections: Socially Integrated   Frequency of Communication with Friends and Family: More than three times a week   Frequency of Social Gatherings with Friends and Family: More than three times a week   Attends Religious Services: More than 4 times per year   Active Member of [employer] or Organizations: Yes   Attends Music therapist: More than 4 times per year   Marital Status: Married  Stress: No Stress Concern Present   Feeling of Stress : Only a little  Tobacco Use: Low Risk    Smoking Tobacco Use: Never   Smokeless Tobacco Use: Never   Passive Exposure: Not on file  Transportation Needs: No Transportation Needs   Lack of Transportation (Medical): No   Lack of Transportation (Non-Medical): No    CCM Care Plan  Allergies  Allergen Reactions   Evolocumab Hypertension    Other reaction(s): Other (see comments)   Bactrim [Sulfamethoxazole-Trimethoprim] Hives, Itching and Swelling   Penicillins Hives    Has patient had a PCN reaction causing immediate rash, facial/tongue/throat swelling, SOB or lightheadedness with  hypotension: Unk Has patient had a PCN reaction causing severe rash involving mucus membranes or skin necrosis: Unk Has patient had a PCN reaction that required hospitalization: Unk Has patient had a PCN reaction occurring within the last 10 years: No If all of the above answers are "NO", then may proceed with Cephalosporin use. Other reaction(s): Other (see comments)   Statins     myalgia Other reaction(s): Other (see comments)   Sulfa Antibiotics     Other reaction(s): Other (see comments)   Sulfa Drugs Cross Reactors Hives, Itching and Swelling   Sulfamethoxazole-Trimethoprim Hives and Itching    Other reaction(s): Other (see comments)    Medications Reviewed Today     Reviewed by Viona Gilmore, [facility] (Pharmacist) on 08/30/21 at 1414  Med List Status: <None>   Medication Order Taking? Sig Documenting Provider Last Dose Status Informant  amLODipine (NORVASC) 5 MG tablet [id number]  Take 5 mg by mouth in the morning and at bedtime. [provider]  Active   aspirin 325 MG tablet [id number]  Take 325 mg by mouth daily. [provider]  Active   B Complex Vitamins (VITAMIN B COMPLEX) TABS [id number]  Take by mouth. [provider]  Active   Blood Glucose Monitoring Suppl [facility] VERIO) w/Device Drucie Opitz [id number]  Use with existing test strips Nafziger, [name], NP  Active   brimonidine (ALPHAGAN) 0.2 % ophthalmic solution [id number]  Place 1 drop into both eyes 2 (two) times daily. [provider]  Active Self  calcitRIOL (ROCALTROL) 0.25 MCG capsule [id number]  Take 3 capsules by mouth daily. [provider]  Active Multiple Informants  Cholecalciferol 50 MCG (2000 UT) CAPS [id number]  Take 1 capsule by mouth daily. [provider]  Active   dorzolamide-timolol (COSOPT) 22.3-6.8 MG/ML ophthalmic solution [id number]  Place 1 drop into both eyes 2 (two) times daily. [provider]  Active Self  ezetimibe (ZETIA) 10 MG tablet [id number]   TAKE 1 TABLET(10 MG) BY MOUTH DAILY Nafziger, [name], NP  Active   ferrous sulfate 325 (65 FE) MG tablet [id number]  Take 325 mg by mouth 2 (two) times daily with a meal. [provider]  Active   hydrALAZINE (APRESOLINE) 100  MG tablet [id number] Yes Take 100 mg by mouth 3 (three) times daily. [provider] Taking Active   Insulin Pen Needle (B-D UF III MINI PEN NEEDLES) 31G X 5 MM MISC [id number]  USE TO TEST BLOOD GLUCOSE FOUR TIMES DAILY Nafziger, [name], NP  Active   labetalol (NORMODYNE) 200 MG tablet [id number]  TAKE 1 TABLET(200 MG) BY MOUTH THREE TIMES DAILY Nafziger, [name], NP  Active   LANTUS SOLOSTAR 100 UNIT/ML Solostar Pen [id number]  9 units in the morning and 5 units in the evening Nafziger, [name], NP  Active   nateglinide (STARLIX) 60 MG tablet [id number]  TAKE 1 TABLET BY MOUTH ONCE A DAY WITH MEALS [provider]  Active   ONETOUCH VERIO test strip [id number]  USE TO TEST TWICE DAILY AS DIRECTED Nafziger, [name], NP  Active   valsartan (DIOVAN) 160 MG tablet [id number] Yes Take 80 mg by mouth 2 (two) times daily. [provider] Taking Active             Patient Active Problem List   Diagnosis Date Noted   Drug-induced myopathy 01/08/2020   Iron deficiency anemia 04/03/2019   Anemia of chronic renal failure, stage 4 (severe) ([place]) 04/03/2019   Late effect of cerebrovascular accident (CVA) 12/07/2017   Hypoglycemia    Uncontrolled hypertension    Hypokalemia    Acute ischemic left MCA stroke ([place]) 07/12/2017   Diabetes mellitus type 2 in obese ([place])    Stage 3 chronic kidney disease (HCC)    Left middle cerebral artery stroke ([place]) 71/24/5809   Acute embolic stroke ([place])    Aphasia 07/08/2017   Gout 07/21/2011   Prostatitis, chronic 07/14/2010   Hyperlipidemia 06/18/2007   Hypertensive kidney disease 01/02/2007    Immunization History  Administered Date(s) Administered   Influenza, High Dose Seasonal PF 06/19/2018   PFIZER(Purple  Top)SARS-COV-2 Vaccination 01/18/2020, 02/08/2020, 05/21/2021   Pneumococcal Conjugate-13 01/03/2019   Pneumococcal Polysaccharide-23 12/20/2011   Td 11/13/2009   Patient's wife reports BP and blood sugars have been inconsistently lately and are unsure of next steps. They are planning on scheduling a sleep study to rule out sleep apnea for uncontrolled blood pressures. For blood sugars, the Toujeo did not help at all and they had to switch back to Lantus. PCP provided a sample of freestyle libre 2 sensor in office but patient has not used yet.  Patient is thinking about going into the pool soon but hasn't had much energy to do exercise.  Conditions to be addressed/monitored:  Hypertension, Hyperlipidemia, Diabetes, Chronic Kidney Disease, Gout and glaucoma, anemia, history of stroke  Conditions addressed this visit: Diabetes, hypertension, hyperlipidemia  Care Plan : CCM Pharmacy Care Plan  Updates made by Viona Gilmore, [place] since 08/31/2021 12:00 AM     Problem: Problem: Hypertension, Hyperlipidemia, Diabetes, Chronic Kidney Disease, Gout and glaucoma, anemia, history of stroke      Long-Range Goal: Patient-Specific Goal   Start Date: 09/30/2020  Expected End Date: 09/30/2021  Recent Progress: On track  Priority: High  Note:   Current Barriers:  Unable to achieve control of blood pressure and diabetes   Pharmacist Clinical Goal(s):  Patient will achieve control of blood pressure as evidenced by home blood pressure readings maintain control of diabetes as evidenced by A1c and blood sugars  through collaboration with PharmD and provider.    Interventions: 1:1 collaboration with Dorothyann Peng, NP regarding development and update of comprehensive plan of care as evidenced by provider attestation and co-signature  Inter-disciplinary care team collaboration (see longitudinal plan of care) Comprehensive medication review performed; medication list updated in electronic medical  record  Hypertension (BP goal <140/90) -Uncontrolled -Current treatment: Amlodipine 5 mg 1 tablet twice daily - Appropriate, Query effective, Safe, Accessible Hydralazine 100 mg 1 tablet three times daily - Appropriate, Query effective, Safe, Accessible Labetalol 200 mg 1 tablet three times daily - Appropriate, Query effective, Safe, Accessible Valsartan 160 mg 1/2 tablet twice daily - Appropriate, Query effective, Safe, Accessible -Medications previously tried: benazepril atenolol-chlorthalidone  -Current home readings: evening 150s; 134, 129, 160 - daytime lows -Current dietary habits: patient is limiting salt and trying to eat cleaner since the stroke -Current exercise habits: going to the gym and using an elliptical 3 days a week -Denies hypotensive/hypertensive symptoms -Educated on Exercise goal of 150 minutes per week; Importance of home blood pressure monitoring; Proper BP monitoring technique; -Counseled to monitor BP at home daily, document, and provide log at future appointments -Counseled on diet and exercise extensively Recommended to continue current medication Recommended as needed BP medication due to variable nature of BP readings.  Hyperlipidemia: (LDL goal < 70) -Uncontrolled -Current treatment: Ezetimibe 10 mg 1 tablet daily - Appropriate, Query effective, Safe, Accessible -Medications previously tried: Repatha (high BP), statins (myalgias) (pravastatin, atorvastatin, simvastatin) -Current dietary patterns: baking foods instead of frying; eating lots of fiber; has been consuming more ice cream lately -Current exercise habits: not active right now -Educated on Cholesterol goals;  Importance of limiting foods high in cholesterol; Exercise goal of 150 minutes per week; -Counseled on diet and exercise extensively Recommended to continue current medication Recommended limiting ice cream consumption.  History of stroke (Goal: prevent recurrent  strokes) -Controlled -Current treatment  Aspirin 325 mg 1 tablet daily - Appropriate, Effective, Query Safe, Accessible -Medications previously tried: none  -Recommended to continue current medication Counseled on monitoring for signs of bleeding such as unexplained and excessive bleeding from a cut or injury, easy or excessive bruising, blood in urine or stools, and nosebleeds without a known cause  Diabetes (A1c goal <8%) (per PCP) -Controlled -Current medications: Lantus inject 9 units in the morning and 5 units in the evening - Appropriate, Query effective, Safe, Accessible Nateglinide 60 mg 1 tablet once daily with meals (needs to be 15 mins before meals) - Appropriate, Query effective, Safe, Accessible -Medications previously tried: glyburide, Novolog, Januvia -Current home glucose readings fasting glucose: 119 (felt lethargic), 139 - morning and evening are about the same, evening is higher some post prandial glucose: not checking after meals -Reports hypoglycemic/hyperglycemic symptoms -Current meal patterns:  breakfast: did not discuss  lunch: did not discuss   dinner: did not discuss  snacks: did not discuss  drinks: did not discuss  -Current exercise: has the intention of going back to the gym - wants to get back to the pool -Educated on Exercise goal of 150 minutes per week; Benefits of routine self-monitoring of blood sugar; Continuous glucose monitoring; Carbohydrate counting and/or plate method -Counseled to check feet daily and get yearly eye exams -Counseled on diet and exercise extensively Recommended to continue current medication Recommended checking blood sugars after meals to make sure it is not running lower.  Anemia (Goal: Hgb > 11) -Controlled -Current treatment  Ferrous sulfate 325 mg 1 tablet daily - Appropriate, Effective, Safe, Accessible -Medications previously tried: none  -Recommended to continue current medication Counseled on taking with food  to prevent upset stomach  Chronic kidney disease (Goal: prevent worsening of kidney function) -Controlled -  Current treatment  No medications -Medications previously tried: none  -Counseled on diet and exercise extensively  Glaucoma (Goal: lower intraocular pressure) -Controlled -Current treatment  Alphagan 0.2% daily - Appropriate, Effective, Safe, Accessible Cosopt 22.3-6.8 daily - Appropriate, Effective, Safe, Accessible -Medications previously tried: none  -Recommended to continue current medication   Health Maintenance -Vaccine gaps: tetanus, shingles -Current therapy:  Vitamin D 2000 units daily Vitamin B complex daily -Educated on Cost vs benefit of each product must be carefully weighed by individual consumer -Patient is satisfied with current therapy and denies issues -Recommended to continue current medication  Patient Goals/Self-Care Activities Patient will:  - take medications as prescribed check glucose daily, document, and provide at future appointments check blood pressure a few times daily, document, and provide at future appointments  Follow Up Plan: The care management team will reach out to the patient again over the next 30 days.        Medication Assistance: None required.  Patient affirms current coverage meets needs.  Compliance/Adherence/Medication fill history: Care Gaps: Shingrix, COVID booster, cologuard Last BP - 160/70 on 08/19/2021 Last A1C - 7.9 on 08/19/2021  Star-Rating Drugs: Nateglinide 60mg  - last filled on 06/16/21 90DS at [place] 160mg  - last filled on 08/17/2021 90DS at [place] preferred pharmacy is:  [facility], [address] AT [address] [place] [address] Phone: [phone] Fax: [phone]   Uses pill box? No - basket on the counter Pt endorses 100% compliance  We discussed: Current pharmacy is preferred  with insurance plan and patient is satisfied with pharmacy services Patient  to: Continue current medication management strategy  Care Plan and Follow Up Patient Decision:  Patient agrees to Care Plan and Follow-up.  Plan: The care management team will reach out to the patient again over the next 30 days.  Jeni Salles, PharmD [facility] Clinical Pharmacist [place] [facility] [place]

## 2021-08-31 DIAGNOSIS — I129 Hypertensive chronic kidney disease with stage 1 through stage 4 chronic kidney disease, or unspecified chronic kidney disease: Secondary | ICD-10-CM | POA: Diagnosis not present

## 2021-08-31 DIAGNOSIS — N184 Chronic kidney disease, stage 4 (severe): Secondary | ICD-10-CM | POA: Diagnosis not present

## 2021-08-31 DIAGNOSIS — Z794 Long term (current) use of insulin: Secondary | ICD-10-CM

## 2021-08-31 DIAGNOSIS — E1122 Type 2 diabetes mellitus with diabetic chronic kidney disease: Secondary | ICD-10-CM

## 2021-09-03 ENCOUNTER — Encounter: Payer: Self-pay | Admitting: Adult Health

## 2021-09-14 ENCOUNTER — Telehealth: Payer: Self-pay | Admitting: Pharmacist

## 2021-09-14 NOTE — Chronic Care Management (AMB) (Signed)
? ? ?Chronic Care Management ?Pharmacy Assistant  ? ?Name: [name] [name]  MRN: [id number] DOB: [birth year] ? ?Reason for Encounter: Disease State / Hypertension and Diabetes  Assessment Call ?  ?Conditions to be addressed/monitored: ?HTN and DMII ? ? ?Recent office visits:  ?None ? ?Recent consult visits:  ?None ? ?Hospital visits:  ?None ? ?Medications: ?Outpatient Encounter Medications as of 09/14/2021  ?Medication Sig  ? amLODipine (NORVASC) 5 MG tablet Take 5 mg by mouth in the morning and at bedtime.  ? aspirin 325 MG tablet Take 325 mg by mouth daily.  ? B Complex Vitamins (VITAMIN B COMPLEX) TABS Take by mouth.  ? Blood Glucose Monitoring Suppl (ONETOUCH VERIO) w/Device KIT Use with existing test strips  ? brimonidine (ALPHAGAN) 0.2 % ophthalmic solution Place 1 drop into both eyes 2 (two) times daily.  ? calcitRIOL (ROCALTROL) 0.25 MCG capsule Take 3 capsules by mouth daily.  ? Cholecalciferol 50 MCG (2000 UT) CAPS Take 1 capsule by mouth daily.  ? dorzolamide-timolol (COSOPT) 22.3-6.8 MG/ML ophthalmic solution Place 1 drop into both eyes 2 (two) times daily.  ? ezetimibe (ZETIA) 10 MG tablet TAKE 1 TABLET(10 MG) BY MOUTH DAILY  ? ferrous sulfate 325 (65 FE) MG tablet Take 325 mg by mouth 2 (two) times daily with a meal.  ? hydrALAZINE (APRESOLINE) 100 MG tablet Take 100 mg by mouth 3 (three) times daily.  ? Insulin Pen Needle (B-D UF III MINI PEN NEEDLES) 31G X 5 MM MISC USE TO TEST BLOOD GLUCOSE FOUR TIMES DAILY  ? labetalol (NORMODYNE) 200 MG tablet TAKE 1 TABLET(200 MG) BY MOUTH THREE TIMES DAILY  ? LANTUS SOLOSTAR 100 UNIT/ML Solostar Pen 9 units in the morning and 5 units in the evening  ? nateglinide (STARLIX) 60 MG tablet TAKE 1 TABLET BY MOUTH ONCE A DAY WITH MEALS  ? ONETOUCH VERIO test strip USE TO TEST TWICE DAILY AS DIRECTED  ? valsartan (DIOVAN) 160 MG tablet Take 80 mg by mouth 2 (two) times daily.  ? ?No facility-administered encounter medications on file as of 09/14/2021.  ?Fill  History: ?BRIMONIDINE TARTRATE  0.2 % SOLN 06/16/2021 90  ? ?CALCITRIOL 0.25MCG CAPSULES 09/02/2021 90  ? ?DORZOLAMIDE-TIMOLOL OPHTH SOLN 08/23/2021 100  ? ?EZETIMIBE 10MG TABLETS 08/17/2021 90  ? ?ONE TOUCH VERIO TEST ST(NEW)100S 07/29/2021 100  ? ?LANTUS SOLOSTAR PEN INJ 3ML 08/19/2021 150  ? ?LABETALOL HYDROCHLORIDE  200 MG TABS 09/14/2021 90  ? ?NATEGLINIDE  60 MG TABS 09/14/2021 90  ? ?VALSARTAN 160MG TABLETS 08/17/2021 90  ? ?AMLODIPINE BESYLATE 5MG TABLETS 08/17/2021 90  ? ?HYDRALAZINE HYDROCHLORIDE  50 MG TABS 09/14/2021 90  ? ?Reviewed chart prior to disease state call. Spoke with patient regarding BP ? ?Recent Office Vitals: ?BP Readings from Last 3 Encounters:  ?08/19/21 (!) 160/70  ?05/20/21 140/70  ?01/08/21 (!) 160/84  ? ?Pulse Readings from Last 3 Encounters:  ?08/19/21 (!) 56  ?05/20/21 65  ?01/08/21 (!) 55  ?  ?Wt Readings from Last 3 Encounters:  ?08/19/21 230 lb (104.3 kg)  ?08/16/21 225 lb (102.1 kg)  ?05/20/21 225 lb 9.6 oz (102.3 kg)  ?  ? ?Kidney Function ?Lab Results  ?Component Value Date/Time  ? CREATININE 2.70 (H) 05/20/2021 10:09 AM  ? CREATININE 2.64 (H) 01/08/2021 08:18 AM  ? CREATININE 2.80 (H) 08/07/2020 08:53 AM  ? CREATININE 2.52 (H) 01/15/2020 08:11 AM  ? CREATININE 2.77 (H) 04/03/2019 11:06 AM  ? GFR 22.52 (L) 05/20/2021 10:09 AM  ? GFRNONAA 23 (L) 08/07/2020  08:53 AM  ? GFRAA 30 05/24/2019 12:00 AM  ? GFRAA 25 (L) 04/03/2019 11:06 AM  ? ? ?BMP Latest Ref Rng & Units 05/20/2021 01/08/2021 09/01/2020  ?Glucose 70 - 99 mg/dL 188(H) 127(H) 143(H)  ?BUN 6 - 23 mg/dL 43(H) 39(H) 31(H)  ?Creatinine 0.40 - 1.50 mg/dL 2.70(H) 2.64(H) 2.45(H)  ?BUN/Creat Ratio 6 - 22 (calc) - - -  ?Sodium 135 - 145 mEq/L 138 142 142  ?Potassium 3.5 - 5.1 mEq/L 4.3 4.2 4.1  ?Chloride 96 - 112 mEq/L 107 110 107  ?CO2 19 - 32 mEq/L _0 ?Calcium 8.4 - 10.5 mg/dL 9.2 9.4 9.4  ? ? ? ?Recent Relevant Labs: ?Lab Results  ?Component Value Date/Time  ? HGBA1C 7.9 (A) 08/19/2021 07:09 AM  ? HGBA1C 7.2 (H)  05/20/2021 10:09 AM  ? HGBA1C 6.5 01/08/2021 08:18 AM  ? HGBA1C 6.5 06/19/2018 08:43 AM  ? HGBA1C 5.5 03/21/2018 07:14 AM  ? MICROALBUR 2.2 (H) 02/13/2014 09:53 AM  ? MICROALBUR 2.4 (H) 09/30/2013 09:50 AM  ?  ?Kidney Function ?Lab Results  ?Component Value Date/Time  ? CREATININE 2.70 (H) 05/20/2021 10:09 AM  ? CREATININE 2.64 (H) 01/08/2021 08:18 AM  ? CREATININE 2.80 (H) 08/07/2020 08:53 AM  ? CREATININE 2.52 (H) 01/15/2020 08:11 AM  ? CREATININE 2.77 (H) 04/03/2019 11:06 AM  ? GFR 22.52 (L) 05/20/2021 10:09 AM  ? GFRNONAA 23 (L) 08/07/2020 08:53 AM  ? GFRAA 30 05/24/2019 12:00 AM  ? GFRAA 25 (L) 04/03/2019 11:06 AM  ? ? ?Current antihypertensive regimen:  ?Amlodipine 5 mg 1 tablet twice daily  ?Hydralazine 100 mg 1 tablet three times daily  ?Labetalol 200 mg 1 tablet three times daily ?Valsartan 160 mg 1/2 tablet twice daily  ? ?How often are you checking your Blood Pressure? Patients wife is checking twice daily ? ?Current home BP readings: His recent readings have been 158/76, 141/63, 159/69 ? ?What recent interventions/DTPs have been made by any provider to improve Blood Pressure control since last CPP Visit: No recent interventions ? ?Any recent hospitalizations or ED visits since last visit with CPP? No recent hospital visits.  ? ?What diet changes have been made to improve Blood Pressure Control?  ?Patient does not follow any specific diet ?Breakfast - patient will have cereal and banana or boiled eggs with grits ?Lunch - patient will have sandwich made with leftover meat ?Dinner - patient will have meal with meat and vegetable with low carbs vegetables.   ? ?What exercise is being done to improve your Blood Pressure Control?  ?Patient is not getting a lot due to stroke. He does go up and down the stairs at home.  ? ? ?Current antihyperglycemic regimen:  ?Lantus inject 9 units in the morning and 5 units in the evening  ?Nateglinide 60 mg 1 tablet once daily with meals  ? ?What recent interventions/DTPs  have been made to improve glycemic control: No recent interventions ? ?Patient denies hypoglycemic symptoms ? ?Patient reports hyperglycemic symptoms, including weakness, his wife will give him something to eat and his symptoms will subside.  ? ?How often are you checking your blood sugar? Patients wife is checking twice daily ? ?What are your blood sugars ranging?  ?Fasting: last readings were 177 and 166 ?After meals: last readings were 166 and 170 ?Lowest reading was 130 and highest was 180 over the past two weeks. ? ?During the week, how often does your blood glucose drop below 70? Patients wife states blood sugar never  falls below 70. ? ?Are you checking your feet daily/regularly? Patients wife is checking his feet regular.  ? ?Adherence Review: ?Is the patient currently on a STATIN medication? No ?Is the patient currently on ACE/ARB medication? Yes ?Does the patient have >5 day gap between last estimated fill dates? No ? ?NOTES: ?Patients wife states they have not gotten the CGM yet, she states her children will help her once she gets it. She is aware to call if she needs an appointment with Jeni Salles to help her with the CGM if her children are unable to help her.  ? ?Care Gaps: ?AWV - scheduled for 08/17/2022 ?Last BP - 160/70 on 08/19/2021 ?Last A1C - 7.9 on 08/19/2021 ?Covid-19 vaccine - overdue  ?Fecal DNA -overdue since 4/22 ?  ?Star Rating Drug: ?Nateglinide 66m - [facility]?Valsartan 1611m- [facility] ?JaGennie AlmaMA  ?Clinical Pharmacist Assistant ?[phone] ?

## 2021-09-29 ENCOUNTER — Telehealth: Payer: Self-pay | Admitting: Pharmacist

## 2021-09-29 NOTE — Chronic Care Management (AMB) (Signed)
? ? ?  Chronic Care Management ?Pharmacy Assistant  ? ?Name: [name] [name]  MRN: [id number] DOB: October 03, 1945 ? ?On 09/14/2021 Patients wife states they have not gotten the CGM yet, she states her children will help her once she gets it. She is aware to call if she needs an appointment with Jeni Salles to help her with the CGM if her children are unable to help her.  ? ?Spoke with patients wife [name]. She states they have not been able to get a CGM yet and she hasn't gotten Hassell Done to agree to using it yet.  She is still waiting for her daughter to come visit, she is unsure as to when she will come visit. They will decide at that time if they will get a CGM.  ? ?Gennie Alma CMA  ?Clinical Pharmacist Assistant ?[phone] ? ?

## 2021-10-05 DIAGNOSIS — Z1211 Encounter for screening for malignant neoplasm of colon: Secondary | ICD-10-CM | POA: Diagnosis not present

## 2021-10-11 LAB — COLOGUARD: COLOGUARD: POSITIVE — AB

## 2021-10-12 ENCOUNTER — Telehealth: Payer: Self-pay

## 2021-10-12 DIAGNOSIS — Z1211 Encounter for screening for malignant neoplasm of colon: Secondary | ICD-10-CM

## 2021-10-12 NOTE — Telephone Encounter (Signed)
Wife states she will speak to me (noted on DPR).  ?Informed of positive cologuard results; that GI referral will be placed. Wife states that pt has used Dr Ardis Hughs in the past & requests referral be placed there. ?

## 2021-11-02 DIAGNOSIS — I129 Hypertensive chronic kidney disease with stage 1 through stage 4 chronic kidney disease, or unspecified chronic kidney disease: Secondary | ICD-10-CM | POA: Diagnosis not present

## 2021-11-02 DIAGNOSIS — E1122 Type 2 diabetes mellitus with diabetic chronic kidney disease: Secondary | ICD-10-CM | POA: Diagnosis not present

## 2021-11-02 DIAGNOSIS — I739 Peripheral vascular disease, unspecified: Secondary | ICD-10-CM | POA: Diagnosis not present

## 2021-11-02 DIAGNOSIS — E782 Mixed hyperlipidemia: Secondary | ICD-10-CM | POA: Diagnosis not present

## 2021-11-02 DIAGNOSIS — Z7982 Long term (current) use of aspirin: Secondary | ICD-10-CM | POA: Diagnosis not present

## 2021-11-02 DIAGNOSIS — I10 Essential (primary) hypertension: Secondary | ICD-10-CM | POA: Diagnosis not present

## 2021-11-02 DIAGNOSIS — E785 Hyperlipidemia, unspecified: Secondary | ICD-10-CM | POA: Diagnosis not present

## 2021-11-02 DIAGNOSIS — N189 Chronic kidney disease, unspecified: Secondary | ICD-10-CM | POA: Diagnosis not present

## 2021-11-02 DIAGNOSIS — E1169 Type 2 diabetes mellitus with other specified complication: Secondary | ICD-10-CM | POA: Diagnosis not present

## 2021-11-02 DIAGNOSIS — I6523 Occlusion and stenosis of bilateral carotid arteries: Secondary | ICD-10-CM | POA: Diagnosis not present

## 2021-11-09 ENCOUNTER — Other Ambulatory Visit: Payer: Self-pay | Admitting: Urology

## 2021-11-09 DIAGNOSIS — D49512 Neoplasm of unspecified behavior of left kidney: Secondary | ICD-10-CM

## 2021-11-15 ENCOUNTER — Encounter: Payer: Self-pay | Admitting: Pharmacist

## 2021-11-15 DIAGNOSIS — G720 Drug-induced myopathy: Secondary | ICD-10-CM

## 2021-11-15 NOTE — Progress Notes (Signed)
[facility])  ?                                          [facility] Quality Pharmacy Team  ?                                      Statin Quality Measure Assessment ?  ? ?11/15/2021 ? ?Cameron ?05-Apr-1946 ?[id number] ? ? ? ? ?Per review of chart and payor information, patient has a diagnosis of diabetes but is not currently filling a statin prescription.  This places patient into the SUPD (Statin Use In Patients with Diabetes) measure for CMS.   ? ?Patient has documented allergy to statin but no corresponding CPT codes that would exclude patient from SUPD measure.  He has documented myalgia with statin therapy listed on his allergy list.  Upcoming appointment with PCP on 11/16/21.  If deemed therapeutically appropriate, a statin exclusion code could be associated with the patient's upcoming visit. (He is currently on Ezetimibe) ? ?The ASCVD Risk score (Arnett DK, et al., 2019) failed to calculate for the following reasons: ?  The patient has a prior MI or stroke diagnosis ?01/08/2021 ? ?   ?Component Value Date/Time  ? CHOL 193 01/08/2021 0818  ? TRIG 115.0 01/08/2021 0818  ? TRIG 323 (HH) 06/13/2006 1050  ? HDL 44.10 01/08/2021 0818  ? CHOLHDL 4 01/08/2021 0818  ? VLDL 23.0 01/08/2021 0818  ? [place] 125 (H) 01/08/2021 0818  ? [place] 98 01/15/2020 0811  ? LDLDIRECT 180.7 06/24/2013 1106  ? ? ?Please consider ONE of the following recommendations:  ?Initiate high intensity statin Atorvastatin '40mg'$  once daily, #90, 3 refills  ? Rosuvastatin '20mg'$  once daily, #90, 3 refills  ?  ?Initiate moderate intensity  ?        statin with reduced frequency if prior  ?        statin intolerance 1x weekly, #13, 3 refills  ? 2x weekly, #26, 3 refills  ? 3x weekly, #39, 3 refills  ?  ?Code for past statin intolerance or  other exclusions (required annually) ? ? Provider Requirements:  ?Associate code during an office visit or telehealth encounter  Drug Induced Myopathy G72.0  ?  Myopathy, unspecified G72.9  ? Myositis, unspecified M60.9  ? Rhabdomyolysis M62.82  ? Alcoholic fatty liver Z02.5  ? Cirrhosis of liver K74.69  ? Prediabetes R73.03  ? PCOS E28.2  ? Toxic liver disease, unspecified K71.9  ?     ? ? ?Plan: Route note to PCP prior to upcoming appointment. ? ?Elayne Guerin, PharmD, BCACP ?[facility] Clinical Pharmacist ?([phone] ? ? ? ? ?

## 2021-11-16 ENCOUNTER — Ambulatory Visit (INDEPENDENT_AMBULATORY_CARE_PROVIDER_SITE_OTHER): Payer: Medicare Other | Admitting: Adult Health

## 2021-11-16 ENCOUNTER — Encounter: Payer: Self-pay | Admitting: Adult Health

## 2021-11-16 ENCOUNTER — Ambulatory Visit (INDEPENDENT_AMBULATORY_CARE_PROVIDER_SITE_OTHER): Payer: Medicare Other

## 2021-11-16 VITALS — BP 130/80 | HR 60 | Temp 99.0°F | Ht 75.0 in | Wt 228.0 lb

## 2021-11-16 DIAGNOSIS — N184 Chronic kidney disease, stage 4 (severe): Secondary | ICD-10-CM

## 2021-11-16 DIAGNOSIS — R2681 Unsteadiness on feet: Secondary | ICD-10-CM

## 2021-11-16 DIAGNOSIS — Z794 Long term (current) use of insulin: Secondary | ICD-10-CM

## 2021-11-16 DIAGNOSIS — Z043 Encounter for examination and observation following other accident: Secondary | ICD-10-CM | POA: Diagnosis not present

## 2021-11-16 DIAGNOSIS — M25561 Pain in right knee: Secondary | ICD-10-CM

## 2021-11-16 DIAGNOSIS — E1122 Type 2 diabetes mellitus with diabetic chronic kidney disease: Secondary | ICD-10-CM

## 2021-11-16 DIAGNOSIS — T466X5A Adverse effect of antihyperlipidemic and antiarteriosclerotic drugs, initial encounter: Secondary | ICD-10-CM | POA: Diagnosis not present

## 2021-11-16 DIAGNOSIS — M791 Myalgia, unspecified site: Secondary | ICD-10-CM | POA: Diagnosis not present

## 2021-11-16 DIAGNOSIS — M1711 Unilateral primary osteoarthritis, right knee: Secondary | ICD-10-CM | POA: Diagnosis not present

## 2021-11-16 DIAGNOSIS — I129 Hypertensive chronic kidney disease with stage 1 through stage 4 chronic kidney disease, or unspecified chronic kidney disease: Secondary | ICD-10-CM

## 2021-11-16 DIAGNOSIS — M25461 Effusion, right knee: Secondary | ICD-10-CM | POA: Diagnosis not present

## 2021-11-16 DIAGNOSIS — M11261 Other chondrocalcinosis, right knee: Secondary | ICD-10-CM | POA: Diagnosis not present

## 2021-11-16 LAB — POCT GLYCOSYLATED HEMOGLOBIN (HGB A1C): Hemoglobin A1C: 7.1 % — AB (ref 4.0–5.6)

## 2021-11-16 NOTE — Progress Notes (Signed)
? ?Subjective:  ? ? Patient ID: [name] [name], male    DOB: 01-15-1946, 76 y.o.   MRN: [id number] ? ?HPI ? ?He presents to the office today for follow-up regarding diabetes and hypertension ? ?Diabetes mellitus type II-currently prescribed Starlix 60 mg daily and Lantus 90 units in the morning and 5 units in the evening.  He does monitor his blood sugars at home and reports readings in the 80-200s.  He denies episodes of hypoglycemia.  During his last visit 3 months ago his A1c had increased from 7.2-7.9 into his evening insulin was increased from 2 units to 5 units.  He was encouraged to start doing some form of exercise as his lifestyle is pretty sedentary. He continues to be pretty sedentary.  ? ?Lab Results  ?Component Value Date  ? HGBA1C 7.9 (A) 08/19/2021  ? ?HTN with Chornic Kidney Disease -this is managed by nephrology.  Currently prescribed hydralazine 50 mg 3 times daily, Norvasc 5 mg daily, valsartan 160 mg daily, labetalol 200 mg 3 times daily. Reports up and down blood pressures.  ?BP Readings from Last 3 Encounters:  ?11/16/21 130/80  ?08/19/21 (!) 160/70  ?05/20/21 140/70  ? ?Gait instability - reports having a fall recently. He reports that he was walking down the hallway at his home 4 days ago and felt as though his right knee gave out. He heard a popping sensation prior to fall. Continue to have pain across the knee cap and above the knee with swelling. Feels as though it has improved slightly. Using tiger balm which helps.  ? ?Review of Systems ?See HPI  ? ?Past Medical History:  ?Diagnosis Date  ? Anemia   ? DIABETES MELLITUS, TYPE II 01/02/2007  ? HYPERLIPIDEMIA 06/18/2007  ? HYPERTENSION 01/02/2007  ? RENAL INSUFFICIENCY 02/05/2008  ? Stroke [facility])   ? Stroke [facility] 2019  ? ? ?Social History  ? ?Socioeconomic History  ? Marital status: Married  ?  Spouse name: Not on file  ? Number of children: Not on file  ? Years of education: Not on file  ? Highest education level: Not on file  ?Occupational  History  ? Not on file  ?Tobacco Use  ? Smoking status: Never  ? Smokeless tobacco: Never  ?Vaping Use  ? Vaping Use: Never used  ?Substance and Sexual Activity  ? Alcohol use: No  ? Drug use: No  ? Sexual activity: Not on file  ?Other Topics Concern  ? Not on file  ?Social History Narrative  ? 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 Married for 43 years  ? Has two daughters, both live in [place]  ? He goes to the gym and works out. Likes to go to football games.   ? ?Social Determinants of Health  ? ?Financial Resource Strain: Low Risk   ? Difficulty of Paying Living Expenses: Not hard at all  ?Food Insecurity: No Food Insecurity  ? Worried About Charity fundraiser in the Last Year: Never true  ? Ran Out of Food in the Last Year: Never true  ?Transportation Needs: No Transportation Needs  ? Lack of Transportation (Medical): No  ? Lack of Transportation (Non-Medical): No  ?Physical Activity: Inactive  ? Days of Exercise per Week: 0 days  ? Minutes of Exercise per Session: 0 min  ?Stress: No Stress Concern Present  ? Feeling of Stress : Only a little  ?Social Connections: Socially Integrated  ? Frequency of Communication with  Friends and Family: More than three times a week  ? Frequency of Social Gatherings with Friends and Family: More than three times a week  ? Attends Religious Services: More than 4 times per year  ? Active Member of Clubs or Organizations: Yes  ? Attends Archivist Meetings: More than 4 times per year  ? Marital Status: Married  ?Intimate Partner Violence: Not At Risk  ? Fear of Current or Ex-Partner: No  ? Emotionally Abused: No  ? Physically Abused: No  ? Sexually Abused: No  ? ? ?Past Surgical History:  ?Procedure Laterality Date  ? LOOP RECORDER INSERTION N/A 07/11/2017  ? Procedure: LOOP RECORDER INSERTION;  Surgeon: Thompson Grayer, MD;  Location: [facility];  Service: Cardiovascular;  Laterality: N/A;  ? SHOULDER SURGERY    ? left  ? TEE WITHOUT CARDIOVERSION  N/A 07/11/2017  ? Procedure: TRANSESOPHAGEAL ECHOCARDIOGRAM (TEE);  Surgeon: Dorothy Spark, MD;  [facility] END[facility];  Service: Cardiovascular;  Laterality: N/A;  ? TRANSURETHRAL RESECTION OF PROSTATE    ? history of retention/hematuria   ? ? ?Family History  ?Problem Relation Age of Onset  ? Hypertension Mother   ? Diabetes Mother   ? Stroke Father   ? Stroke Maternal Grandmother   ? Colon cancer Neg Hx   ? Esophageal cancer Neg Hx   ? Rectal cancer Neg Hx   ? Stomach cancer Neg Hx   ? ? ?Allergies  ?Allergen Reactions  ? Evolocumab Hypertension  ?  Other reaction(s): Other (see comments)  ? Bactrim [Sulfamethoxazole-Trimethoprim] Hives, Itching and Swelling  ? Penicillins Hives  ?  Has patient had a PCN reaction causing immediate rash, facial/tongue/throat swelling, SOB or lightheadedness with hypotension: Unk ?Has patient had a PCN reaction causing severe rash involving mucus membranes or skin necrosis: Unk ?Has patient had a PCN reaction that required hospitalization: Unk ?Has patient had a PCN reaction occurring within the last 10 years: No ?If all of the above answers are "NO", then may proceed with Cephalosporin use. ?Other reaction(s): Other (see comments)  ? Statins   ?  myalgia ?Other reaction(s): Other (see comments)  ? Sulfa Antibiotics   ?  Other reaction(s): Other (see comments)  ? Sulfa Drugs Cross Reactors Hives, Itching and Swelling  ? Sulfamethoxazole-Trimethoprim Hives and Itching  ?  Other reaction(s): Other (see comments)  ? ? ?Current Outpatient Medications on File Prior to Visit  ?Medication Sig Dispense Refill  ? amLODipine (NORVASC) 5 MG tablet Take 5 mg by mouth in the morning and at bedtime.    ? aspirin 325 MG tablet Take 325 mg by mouth daily.    ? B Complex Vitamins (VITAMIN B COMPLEX) TABS Take by mouth.    ? Blood Glucose Monitoring Suppl (ONETOUCH VERIO) w/Device KIT Use with existing test strips 1 kit kit  ? brimonidine (ALPHAGAN) 0.2 % ophthalmic solution Place 1 drop into  both eyes 2 (two) times daily.    ? calcitRIOL (ROCALTROL) 0.25 MCG capsule Take 3 capsules by mouth daily.    ? Cholecalciferol 50 MCG (2000 UT) CAPS Take 1 capsule by mouth daily.    ? dorzolamide-timolol (COSOPT) 22.3-6.8 MG/ML ophthalmic solution Place 1 drop into both eyes 2 (two) times daily.    ? ezetimibe (ZETIA) 10 MG tablet TAKE 1 TABLET(10 MG) BY MOUTH DAILY 90 tablet 3  ? ferrous sulfate 325 (65 FE) MG tablet Take 325 mg by mouth 2 (two) times daily with a meal.    ?  hydrALAZINE (APRESOLINE) 100 MG tablet Take 100 mg by mouth 3 (three) times daily.    ? Insulin Pen Needle (B-D UF III MINI PEN NEEDLES) 31G X 5 MM MISC USE TO TEST BLOOD GLUCOSE FOUR TIMES DAILY 400 each 0  ? labetalol (NORMODYNE) 200 MG tablet TAKE 1 TABLET(200 MG) BY MOUTH THREE TIMES DAILY 270 tablet 3  ? LANTUS SOLOSTAR 100 UNIT/ML Solostar Pen 9 units in the morning and 5 units in the evening 15 mL 0  ? nateglinide (STARLIX) 60 MG tablet TAKE 1 TABLET BY MOUTH ONCE A DAY WITH MEALS    ? ONETOUCH VERIO test strip USE TO TEST TWICE DAILY AS DIRECTED 200 strip 2  ? valsartan (DIOVAN) 160 MG tablet Take 80 mg by mouth 2 (two) times daily.    ? ?No current facility-administered medications on file prior to visit.  ? ? ?BP 130/80   Pulse 60   Temp 99 ?F (37.2 ?C) (Oral)   Ht $R'6\' 3"'pn$  (1.905 m)   Wt 228 lb (103.4 kg)   SpO2 98%   BMI 28.50 kg/m?  ? ? ?   ?Objective:  ? Physical Exam ?Vitals and nursing note reviewed.  ?Constitutional:   ?   Appearance: Normal appearance.  ?Cardiovascular:  ?   Rate and Rhythm: Normal rate and regular rhythm.  ?   Pulses: Normal pulses.  ?   Heart sounds: Normal heart sounds.  ?Pulmonary:  ?   Effort: Pulmonary effort is normal.  ?   Breath sounds: Normal breath sounds.  ?Musculoskeletal:     ?   General: Normal range of motion.  ?   Right knee: Swelling and bony tenderness present. No deformity or crepitus. Normal range of motion. Tenderness present.  ?     Legs: ? ?Skin: ?   General: Skin is warm and dry.   ?Neurological:  ?   General: No focal deficit present.  ?   Mental Status: He is alert and oriented to person, place, and time.  ?Psychiatric:     ?   Mood and Affect: Mood normal.     ?   Behavior: Behavior nor

## 2021-11-16 NOTE — Patient Instructions (Signed)
Health Maintenance Due  ?Topic Date Due  ? Zoster Vaccines- Shingrix (1 of 2) Never done  ? COVID-19 Vaccine (4 - Booster for Pfizer series) 07/16/2021  ? ? ? ? Row Labels 11/16/2021  ?  7:09 AM 08/19/2021  ?  7:00 AM 08/16/2021  ?  9:50 AM  ?Depression screen PHQ 2/9   Section Header. No data exists in this row.     ?Decreased Interest   1 2 0  ?Down, Depressed, Hopeless   '1 1 1  '$ ?PHQ - 2 Score   '2 3 1  '$ ?Altered sleeping   2 2   ?Tired, decreased energy   1 2   ?Change in appetite   2 3   ?Feeling bad or failure about yourself    1 1   ?Trouble concentrating   1 0   ?Moving slowly or fidgety/restless   1 2   ?Suicidal thoughts   0 0   ?PHQ-9 Score   10 13   ?Difficult doing work/chores   Somewhat difficult Somewhat difficult   ? ? ?

## 2021-11-17 ENCOUNTER — Telehealth: Payer: Self-pay | Admitting: Adult Health

## 2021-11-17 DIAGNOSIS — S82001A Unspecified fracture of right patella, initial encounter for closed fracture: Secondary | ICD-10-CM

## 2021-11-17 NOTE — Telephone Encounter (Signed)
Updated patients wife on xray results which showed likely patella fracture  ?

## 2021-11-24 ENCOUNTER — Ambulatory Visit
Admission: RE | Admit: 2021-11-24 | Discharge: 2021-11-24 | Disposition: A | Discharge status: Home / Self Care | Payer: Medicare Other | Source: Ambulatory Visit | Attending: Orthopaedic Surgery | Admitting: Orthopaedic Surgery

## 2021-11-24 ENCOUNTER — Ambulatory Visit: Payer: Medicare Other | Admitting: Orthopaedic Surgery

## 2021-11-24 ENCOUNTER — Encounter: Payer: Self-pay | Admitting: Orthopaedic Surgery

## 2021-11-24 DIAGNOSIS — M25561 Pain in right knee: Secondary | ICD-10-CM

## 2021-11-24 MED ORDER — TRAMADOL HCL 50 MG PO TABS: 50.0000 mg | ORAL_TABLET | Freq: Every day | ORAL | 0 refills | Status: DC | PRN

## 2021-11-24 NOTE — Progress Notes (Signed)
Office Visit Note   Patient: [name] [name]           Date of Birth: Nov 19, 1945           MRN: [id number] Visit Date: 11/24/2021              Requested by: Dorothyann Peng, NP [address] [place],  [place] [address] PCP: Dorothyann Peng, NP   Assessment & Plan: Visit Diagnoses:  1. Acute pain of right knee     Plan: Impression is partial versus complete rupture of the quadriceps tendon.  We will order MRI to assess.  Knee immobilizer provided today.  Follow-up after the MRI.  Follow-Up Instructions: No follow-ups on file.   Orders:  Orders Placed This Encounter  Procedures   MR Knee Right w/o contrast   Meds ordered this encounter  Medications   traMADol (ULTRAM) 50 MG tablet    Sig: Take 1-2 tablets (50-100 mg total) by mouth daily as needed.    Dispense:  20 tablet    Refill:  0      Procedures: No procedures performed   Clinical Data: No additional findings.   Subjective: Chief Complaint  Patient presents with   Right Knee - Pain    HPI [name] [name] is a very pleasant 76 year old gentleman retired who comes in for evaluation of acute right knee injury status post mechanical fall last Friday.  He feels like the knee jammed.  Pain is 7-8 out of 10.  Pain is worse with bending of the knee or with weightbearing or getting up from a seated position. Review of Systems  Constitutional: Negative.   All other systems reviewed and are negative.   Objective: Vital Signs: There were no vitals taken for this visit.  Physical Exam Vitals and nursing note reviewed.  Constitutional:      Appearance: He is well-developed.  HENT:     Head: Normocephalic and atraumatic.  Eyes:     Pupils: Pupils are equal, round, and reactive to light.  Pulmonary:     Effort: Pulmonary effort is normal.  Abdominal:     Palpations: Abdomen is soft.  Musculoskeletal:        General: Normal range of motion.     Cervical back: Neck supple.  Skin:    General: Skin is warm.   Neurological:     Mental Status: He is alert and oriented to person, place, and time.  Psychiatric:        Behavior: Behavior normal.        Thought Content: Thought content normal.        Judgment: Judgment normal.    Ortho Exam Examination of right knee shows a large joint effusion.  Significant tenderness to the quadriceps insertion.  He is able to extend against gravity but this causes quite a bit of pain.  Collaterals and cruciates are stable. Specialty Comments:  No specialty comments available.  Imaging: No results found.   PMFS History: Patient Active Problem List   Diagnosis Date Noted   Acute pain of right knee 11/24/2021   Drug-induced myopathy 01/08/2020   Iron deficiency anemia 04/03/2019   Anemia of chronic renal failure, stage 4 (severe) ([place]) 04/03/2019   Late effect of cerebrovascular accident (CVA) 12/07/2017   Hypoglycemia    Uncontrolled hypertension    Hypokalemia    Acute ischemic left MCA stroke ([place]) 07/12/2017   Diabetes mellitus type 2 in obese [facility])    Stage 3 chronic kidney disease ([place])  Left middle cerebral artery stroke ([place]) 16/04/9603   Acute embolic stroke ([place])    Aphasia 07/08/2017   Gout 07/21/2011   Prostatitis, chronic 07/14/2010   Hyperlipidemia 06/18/2007   Hypertensive kidney disease 01/02/2007   Past Medical History:  Diagnosis Date   Anemia    DIABETES MELLITUS, TYPE II 01/02/2007   HYPERLIPIDEMIA 06/18/2007   HYPERTENSION 01/02/2007   RENAL INSUFFICIENCY 02/05/2008   Stroke ([place])    Stroke [facility]) 2019    Family History  Problem Relation Age of Onset   Hypertension Mother    Diabetes Mother    Stroke Father    Stroke Maternal Grandmother    Colon cancer Neg Hx    Esophageal cancer Neg Hx    Rectal cancer Neg Hx    Stomach cancer Neg Hx     Past Surgical History:  Procedure Laterality Date   LOOP RECORDER INSERTION N/A 07/11/2017   Procedure: LOOP RECORDER INSERTION;  Surgeon: Thompson Grayer, MD;  Location: [facility];  Service: Cardiovascular;  Laterality: N/A;   SHOULDER SURGERY     left   TEE WITHOUT CARDIOVERSION N/A 07/11/2017   Procedure: TRANSESOPHAGEAL ECHOCARDIOGRAM (TEE);  Surgeon: Dorothy Spark, MD;  Location: [facility] ENDOSCOPY;  Service: Cardiovascular;  Laterality: N/A;   TRANSURETHRAL RESECTION OF PROSTATE     history of retention/hematuria    Social History   Occupational History   Not on file  Tobacco Use   Smoking status: Never   Smokeless tobacco: Never  Vaping Use   Vaping Use: Never used  Substance and Sexual Activity   Alcohol use: No   Drug use: No   Sexual activity: Not on file

## 2021-12-10 ENCOUNTER — Ambulatory Visit: Payer: Medicare Other | Admitting: Orthopaedic Surgery

## 2021-12-10 ENCOUNTER — Telehealth: Payer: Self-pay

## 2021-12-10 DIAGNOSIS — S76111A Strain of right quadriceps muscle, fascia and tendon, initial encounter: Secondary | ICD-10-CM | POA: Diagnosis not present

## 2021-12-10 MED ORDER — TRAMADOL HCL 50 MG PO TABS: 50.0000 mg | ORAL_TABLET | Freq: Every day | ORAL | 0 refills | Status: DC | PRN

## 2021-12-10 NOTE — Telephone Encounter (Signed)
Called patient after his visit today. No answer. LMOM that he can remove the playmaker brace when he is not walking. Also notified him that Dr.Xu will call in a refill of tramadol to his pharmacy.

## 2021-12-10 NOTE — Progress Notes (Signed)
Office Visit Note   Patient: [name] [name]           Date of Birth: December 29, 1945           MRN: [id number] Visit Date: 12/10/2021              Requested by: Dorothyann Peng, NP [address] [place],  [place] [address] PCP: Dorothyann Peng, NP   Assessment & Plan: Visit Diagnoses:  1. Strain of quadriceps, right, initial encounter     Plan: MRI confirms this partial quadriceps tear mainly of the vastus intermedius.  The bulk of the quadriceps tendon is intact.  We will continue to treat this nonoperatively.  We will place him in a playmaker knee brace and allow 0 to 60 degrees of range of motion.  Recheck in 2 weeks.  Anticipate opening up to 90 degrees at that time.  Tramadol refilled today.  Follow-Up Instructions: Return in about 2 weeks (around 12/24/2021).   Orders:  No orders of the defined types were placed in this encounter.  Meds ordered this encounter  Medications   traMADol (ULTRAM) 50 MG tablet    Sig: Take 1-2 tablets (50-100 mg total) by mouth daily as needed.    Dispense:  20 tablet    Refill:  0      Procedures: No procedures performed   Clinical Data: No additional findings.   Subjective: Chief Complaint  Patient presents with   Right Knee - Follow-up    HPI [name] returns today to discuss left knee MRI.  He feels slight improvement.  Review of Systems   Objective: Vital Signs: There were no vitals taken for this visit.  Physical Exam  Ortho Exam Examination of right knee shows good strength in knee extension flexion without pain. Specialty Comments:  No specialty comments available.  Imaging: No results found.   PMFS History: Patient Active Problem List   Diagnosis Date Noted   Strain of quadriceps, right, initial encounter 12/10/2021   Acute pain of right knee 11/24/2021   Drug-induced myopathy 01/08/2020   Iron deficiency anemia 04/03/2019   Anemia of chronic renal failure, stage 4 (severe) ([place]) 04/03/2019    Late effect of cerebrovascular accident (CVA) 12/07/2017   Hypoglycemia    Uncontrolled hypertension    Hypokalemia    Acute ischemic left MCA stroke ([place]) 07/12/2017   Diabetes mellitus type 2 in obese [facility])    Stage 3 chronic kidney disease ([place])    Left middle cerebral artery stroke ([place]) 05/19/5207   Acute embolic stroke ([place])    Aphasia 07/08/2017   Gout 07/21/2011   Prostatitis, chronic 07/14/2010   Hyperlipidemia 06/18/2007   Hypertensive kidney disease 01/02/2007   Past Medical History:  Diagnosis Date   Anemia    DIABETES MELLITUS, TYPE II 01/02/2007   HYPERLIPIDEMIA 06/18/2007   HYPERTENSION 01/02/2007   RENAL INSUFFICIENCY 02/05/2008   Stroke ([place])    Stroke ([place]) 2019    Family History  Problem Relation Age of Onset   Hypertension Mother    Diabetes Mother    Stroke Father    Stroke Maternal Grandmother    Colon cancer Neg Hx    Esophageal cancer Neg Hx    Rectal cancer Neg Hx    Stomach cancer Neg Hx     Past Surgical History:  Procedure Laterality Date   LOOP RECORDER INSERTION N/A 07/11/2017   Procedure: LOOP RECORDER INSERTION;  Surgeon: Thompson Grayer, MD;  Location: [facility];  Service: Cardiovascular;  Laterality: N/A;   SHOULDER SURGERY     left   TEE WITHOUT CARDIOVERSION N/A 07/11/2017   Procedure: TRANSESOPHAGEAL ECHOCARDIOGRAM (TEE);  Surgeon: Dorothy Spark, MD;  Location: [facility] ENDOSCOPY;  Service: Cardiovascular;  Laterality: N/A;   TRANSURETHRAL RESECTION OF PROSTATE     history of retention/hematuria    Social History   Occupational History   Not on file  Tobacco Use   Smoking status: Never   Smokeless tobacco: Never  Vaping Use   Vaping Use: Never used  Substance and Sexual Activity   Alcohol use: No   Drug use: No   Sexual activity: Not on file

## 2021-12-13 ENCOUNTER — Other Ambulatory Visit: Payer: Self-pay | Admitting: Adult Health

## 2021-12-22 ENCOUNTER — Encounter: Payer: Self-pay | Admitting: Orthopaedic Surgery

## 2021-12-22 ENCOUNTER — Ambulatory Visit: Payer: Medicare Other | Admitting: Orthopaedic Surgery

## 2021-12-22 DIAGNOSIS — S76111A Strain of right quadriceps muscle, fascia and tendon, initial encounter: Secondary | ICD-10-CM | POA: Diagnosis not present

## 2021-12-22 NOTE — Progress Notes (Signed)
Office Visit Note   Patient: [name] [name]           Date of Birth: [birth year]           MRN: [id number] Visit Date: 12/22/2021              Requested by: Dorothyann Peng, NP [address] [place],  [place] [address] PCP: Dorothyann Peng, NP   Assessment & Plan: Visit Diagnoses:  1. Strain of quadriceps, right, initial encounter     Plan: Impression is 5 weeks status post right partial quadriceps tear.  At this point, I will unlocked his playmaker brace from 0 to 90 degrees of flexion.  I have sent in a referral for outpatient physical therapy to work on gentle range of motion.  He will follow-up with [place] in 3 weeks time for recheck.  Call with concerns or questions in the meantime.  Follow-Up Instructions: Return in about 3 weeks (around 01/12/2022).   Orders:  Orders Placed This Encounter  Procedures   Ambulatory referral to Physical Therapy   No orders of the defined types were placed in this encounter.     Procedures: No procedures performed   Clinical Data: No additional findings.   Subjective: Chief Complaint  Patient presents with   Right Leg - Follow-up    Quadriceps tear    HPI patient is a very pleasant 76 year old gentleman who comes in today approximately 5 weeks status post right partial quadriceps tear, date of injury 11/19/2021.  He has been doing well.  He has been wearing his playmaker brace locked from 0 to 60 degrees.  He has some discomfort which primarily occurs at the end of the day.  This is relieved with tramadol.  Overall, doing better.     Objective: Vital Signs: There were no vitals taken for this visit.    Ortho Exam right knee exam shows a small to moderate effusion.  Range of motion 0 to 60 degrees.  He is able to straight leg raise.  He is neurovascular intact distally.  Specialty Comments:  No specialty comments available.  Imaging: No new imaging   PMFS History: Patient Active Problem List   Diagnosis Date Noted    Strain of quadriceps, right, initial encounter 12/10/2021   Acute pain of right knee 11/24/2021   Drug-induced myopathy 01/08/2020   Iron deficiency anemia 04/03/2019   Anemia of chronic renal failure, stage 4 (severe) ([place]) 04/03/2019   Late effect of cerebrovascular accident (CVA) 12/07/2017   Hypoglycemia    Uncontrolled hypertension    Hypokalemia    Acute ischemic left MCA stroke ([place]) 07/12/2017   Diabetes mellitus type 2 in obese [facility])    Stage 3 chronic kidney disease ([place])    Left middle cerebral artery stroke ([place]) 78/24/2353   Acute embolic stroke ([place])    Aphasia 07/08/2017   Gout 07/21/2011   Prostatitis, chronic 07/14/2010   Hyperlipidemia 06/18/2007   Hypertensive kidney disease 01/02/2007   Past Medical History:  Diagnosis Date   Anemia    DIABETES MELLITUS, TYPE II 01/02/2007   HYPERLIPIDEMIA 06/18/2007   HYPERTENSION 01/02/2007   RENAL INSUFFICIENCY 02/05/2008   Stroke ([place])    Stroke ([place]) 2019    Family History  Problem Relation Age of Onset   Hypertension Mother    Diabetes Mother    Stroke Father    Stroke Maternal Grandmother    Colon cancer Neg Hx    Esophageal cancer Neg Hx    Rectal  cancer Neg Hx    Stomach cancer Neg Hx     Past Surgical History:  Procedure Laterality Date   LOOP RECORDER INSERTION N/A 07/11/2017   Procedure: LOOP RECORDER INSERTION;  Surgeon: Thompson Grayer, MD;  Location: [facility];  Service: Cardiovascular;  Laterality: N/A;   SHOULDER SURGERY     left   TEE WITHOUT CARDIOVERSION N/A 07/11/2017   Procedure: TRANSESOPHAGEAL ECHOCARDIOGRAM (TEE);  Surgeon: Dorothy Spark, MD;  Location: [facility] ENDOSCOPY;  Service: Cardiovascular;  Laterality: N/A;   TRANSURETHRAL RESECTION OF PROSTATE     history of retention/hematuria    Social History   Occupational History   Not on file  Tobacco Use   Smoking status: Never   Smokeless tobacco: Never  Vaping Use   Vaping Use: Never used  Substance and Sexual Activity    Alcohol use: No   Drug use: No   Sexual activity: Not on file

## 2021-12-24 DIAGNOSIS — E559 Vitamin D deficiency, unspecified: Secondary | ICD-10-CM | POA: Diagnosis not present

## 2021-12-24 DIAGNOSIS — N184 Chronic kidney disease, stage 4 (severe): Secondary | ICD-10-CM | POA: Diagnosis not present

## 2021-12-24 DIAGNOSIS — M1000 Idiopathic gout, unspecified site: Secondary | ICD-10-CM | POA: Diagnosis not present

## 2021-12-24 DIAGNOSIS — E211 Secondary hyperparathyroidism, not elsewhere classified: Secondary | ICD-10-CM | POA: Diagnosis not present

## 2021-12-24 DIAGNOSIS — E1169 Type 2 diabetes mellitus with other specified complication: Secondary | ICD-10-CM | POA: Diagnosis not present

## 2021-12-24 DIAGNOSIS — I10 Essential (primary) hypertension: Secondary | ICD-10-CM | POA: Diagnosis not present

## 2021-12-24 DIAGNOSIS — R809 Proteinuria, unspecified: Secondary | ICD-10-CM | POA: Diagnosis not present

## 2021-12-24 DIAGNOSIS — R309 Painful micturition, unspecified: Secondary | ICD-10-CM | POA: Diagnosis not present

## 2021-12-24 DIAGNOSIS — D649 Anemia, unspecified: Secondary | ICD-10-CM | POA: Diagnosis not present

## 2021-12-24 DIAGNOSIS — N189 Chronic kidney disease, unspecified: Secondary | ICD-10-CM | POA: Diagnosis not present

## 2021-12-29 ENCOUNTER — Encounter: Payer: Self-pay | Admitting: Gastroenterology

## 2021-12-29 ENCOUNTER — Ambulatory Visit: Payer: Medicare Other | Admitting: Gastroenterology

## 2021-12-29 VITALS — BP 170/90 | HR 64 | Ht 72.75 in | Wt 227.1 lb

## 2021-12-29 DIAGNOSIS — R195 Other fecal abnormalities: Secondary | ICD-10-CM | POA: Diagnosis not present

## 2021-12-29 MED ORDER — NA SULFATE-K SULFATE-MG SULF 17.5-3.13-1.6 GM/177ML PO SOLN: 1.0000 | ORAL | 0 refills | Status: DC

## 2021-12-29 NOTE — Progress Notes (Signed)
Review of pertinent gastrointestinal problems: 1.  Iron deficiency anemia work-up.  May 2020 hemoglobin 9, MCV 78, ferritin 5.9.  EGD June 2020 showed a normal examination.  Biopsies were taken from his duodenum to check for celiac sprue.  Pathology was negative for sprue. colonoscopy June 2020 two subcentimeter polyps were removed.  The examination was otherwise normal.   Pathology showed adenomatous polyps.  I recommended recall colonoscopy at 7-year interval.  Continued work-up for iron deficiency anemia included in July 2020 PA and lateral chest x-ray that was normal, July 2020 CT scan abdomen without IV contrast that suggested a new 1.6 cm lesion in his liver.  Nonobstructing right renal stone.  Follow-up MRI of the liver favored benign cystic lesions in the liver.  The radiologist wrote to "consider follow-up imaging in 6 months with repeat CT scan or MRI to confirm the benignity of this lesion."  Follow-up labs through his primary care physician July 2020 showed not much improvement in his iron levels with a hemoglobin of 9.4, MCV of 77, creatinine 2.7 which is fairly chronic for him.  He was referred to hematology. 04/2019 FOBT 2 sets of home cards were negative. 2.  Multiple benign-appearing liver cysts.  Present at least since 2005, confirmed again MRI 2020 that they appear benign.  HPI: This is a very pleasant 76 year old man who was referred to me by Dorothyann Peng, NP  to evaluate Cologuard positive stool.    I last saw him here in the office September 2020, a little less than 3 years ago for his iron deficiency anemia.  See all that summarized above.  He is here today for a new problem.  He underwent colon cancer screening with a Cologuard stool testing couple months ago and it was positive.  I am not sure why I he had that done since he had just had a colonoscopy in 2020 and was in the recall system for repeat surveillance colonoscopy in June 2027.  He is here with his wife today.  He has  had no troubles with his bowels.  He has had no overt GI bleeding.  Colon cancer does not run in his family.  He has had no constipation, diarrhea or abdominal pains.  His weight is overall stable.   Review of systems: Pertinent positive and negative review of systems were noted in the above HPI section. All other review negative.   Past Medical History:  Diagnosis Date   Anemia    Cataract    DIABETES MELLITUS, TYPE II 01/02/2007   HYPERLIPIDEMIA 06/18/2007   HYPERTENSION 01/02/2007   RENAL INSUFFICIENCY 02/05/2008   Stroke ([place]) 2019    Past Surgical History:  Procedure Laterality Date   LOOP RECORDER INSERTION N/A 07/11/2017   Procedure: LOOP RECORDER INSERTION;  Surgeon: Thompson Grayer, MD;  Location: [place] [facility];  Service: Cardiovascular;  Laterality: N/A;   SHOULDER SURGERY     left   TEE WITHOUT CARDIOVERSION N/A 07/11/2017   Procedure: TRANSESOPHAGEAL ECHOCARDIOGRAM (TEE);  Surgeon: Dorothy Spark, MD;  Location: [facility] ENDOSCOPY;  Service: Cardiovascular;  Laterality: N/A;   TRANSURETHRAL RESECTION OF PROSTATE     history of retention/hematuria     Current Outpatient Medications  Medication Instructions   amLODipine (NORVASC) 5 mg, Oral, 2 times daily   aspirin 325 mg, Oral, Daily   B Complex Vitamins (VITAMIN B COMPLEX) TABS Oral   Blood Glucose Monitoring Suppl (ONETOUCH VERIO) w/Device KIT Use with existing test strips   brimonidine (ALPHAGAN) 0.2 %  ophthalmic solution 1 drop, Both Eyes, 2 times daily   brimonidine-timolol (COMBIGAN) 0.2-0.5 % ophthalmic solution Ophthalmic   calcitRIOL (ROCALTROL) 0.25 MCG capsule 1 capsule, Oral, Daily   Cholecalciferol 50 MCG (2000 UT) CAPS 1 capsule, Oral, Daily   ezetimibe (ZETIA) 10 MG tablet TAKE 1 TABLET(10 MG) BY MOUTH DAILY   ferrous sulfate 325 mg, Oral, 2 times daily with meals   hydrALAZINE (APRESOLINE) 100 mg, Oral, 3 times daily   Insulin Pen Needle (B-D UF III MINI PEN NEEDLES) 31G X 5 MM MISC USE TO TEST BLOOD  GLUCOSE FOUR TIMES DAILY   labetalol (NORMODYNE) 200 MG tablet TAKE 1 TABLET(200 MG) BY MOUTH THREE TIMES DAILY   LANTUS SOLOSTAR 100 UNIT/ML Solostar Pen 9 units in the morning and 5 units in the evening   nateglinide (STARLIX) 60 MG tablet TAKE 1 TABLET BY MOUTH ONCE A DAY WITH MEALS   ONETOUCH VERIO test strip USE TO TEST TWICE DAILY AS DIRECTED   traMADol (ULTRAM) 50-100 mg, Oral, Daily PRN   valsartan (DIOVAN) 80 mg, Oral, 2 times daily    Allergies as of 12/29/2021 - Review Complete 12/29/2021  Allergen Reaction Noted   Evolocumab Hypertension 12/07/2017   Bactrim [sulfamethoxazole-trimethoprim] Hives, Itching, and Swelling 12/21/2010   Penicillins Hives 08/02/2006   Statins  12/20/2011   Sulfa antibiotics  01/09/2020   Sulfa drugs cross reactors Hives, Itching, and Swelling 12/21/2010   Sulfamethoxazole-trimethoprim Hives and Itching 05/23/2011    Family History  Problem Relation Age of Onset   Hypertension Mother    Diabetes Mother    Stroke Father    Stroke Maternal Grandmother    Colon cancer Neg Hx    Esophageal cancer Neg Hx    Rectal cancer Neg Hx    Stomach cancer Neg Hx     Social History   Socioeconomic History   Marital status: Married    Spouse name: Not on file   Number of children: Not on file   Years of education: Not on file   Highest education level: Not on file  Occupational History   Not on file  Tobacco Use   Smoking status: Never   Smokeless tobacco: Never  Vaping Use   Vaping Use: Never used  Substance and Sexual Activity   Alcohol use: No   Drug use: No   Sexual activity: Not on file  Other Topics Concern   Not on file  Social History Narrative   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 Likes to go to football games.    Social Determinants of Health   Financial Resource Strain: Low Risk  (08/16/2021)   Overall  Financial Resource Strain (CARDIA)    Difficulty of Paying Living Expenses: Not hard at all  Food Insecurity: No Food Insecurity (08/16/2021)   Hunger Vital Sign    Worried About Running Out of Food in the Last Year: Never true    Ran Out of Food in the Last Year: Never true  Transportation Needs: No Transportation Needs (08/16/2021)   PRAPARE - Hydrologist (Medical): No    Lack of Transportation (Non-Medical): No  Physical Activity: Inactive (08/16/2021)   Exercise Vital Sign    Days of Exercise per Week: 0 days    Minutes of Exercise per Session: 0 min  Stress: No  Stress Concern Present (08/16/2021)   [place]    Feeling of Stress : Only a little  Social Connections: Socially Integrated (08/16/2021)   Social Connection and Isolation Panel [NHANES]    Frequency of Communication with Friends and Family: More than three times a week    Frequency of Social Gatherings with Friends and Family: More than three times a week    Attends Religious Services: More than 4 times per year    Active Member of [employer] or Organizations: Yes    Attends Music therapist: More than 4 times per year    Marital Status: Married  Human resources officer Violence: Not At Risk (08/16/2021)   Humiliation, Afraid, Rape, and Kick questionnaire    Fear of Current or Ex-Partner: No    Emotionally Abused: No    Physically Abused: No    Sexually Abused: No     Physical Exam: BP (!) 170/90 (BP Location: Left Arm, Patient Position: Sitting, Cuff Size: Normal)   Pulse 64   Ht 6' 0.75" (1.848 m) Comment: height measured without shoes  Wt 227 lb 2 oz (103 kg)   BMI 30.17 kg/m  Constitutional: generally well-appearing Psychiatric: alert and oriented x3 Eyes: extraocular movements intact Mouth: oral pharynx moist, no lesions Neck: supple no lymphadenopathy Cardiovascular: heart regular rate and rhythm Lungs: clear  to auscultation bilaterally Abdomen: soft, nontender, nondistended, no obvious ascites, no peritoneal signs, normal bowel sounds Extremities: no lower extremity edema bilaterally Skin: no lesions on visible extremities   Assessment and plan: 76 y.o. male with Cologuard positive stool  He underwent colon cancer screening with Cologuard testing earlier this year.  He was not an appropriate candidate for that test given his history of precancerous colon polyps.  Similarly he was not "due" for colon cancer screening or surveillance colonoscopies until 2027.  He and I discussed this all with his wife today in the office.  We agreed that since he had this test and the result was positive he should err on the side of caution and repeat his colonoscopy now instead of waiting until 2027.  I see no reason for further blood tests or imaging studies prior to then.    Please see the "Patient Instructions" section for addition details about the plan.   Owens Loffler, MD [facility] 12/29/2021, 9:02 AM  Cc: Dorothyann Peng, NP  Total time on date of encounter was 46  minutes (this included time spent preparing to see the patient reviewing records; obtaining and/or reviewing separately obtained history; performing a medically appropriate exam and/or evaluation; counseling and educating the patient and family if present; ordering medications, tests or procedures if applicable; and documenting clinical information in the health record).     Marland Kitchen

## 2021-12-29 NOTE — Patient Instructions (Signed)
If you are age 75 or older, your body mass index should be between 23-30. Your Body mass index is 30.17 kg/m. If this is out of the aforementioned range listed, please consider follow up with your Primary Care Provider. ________________________________________________________  The [facility] providers would like to encourage you to use [facility] to communicate with providers for non-urgent requests or questions.  Due to long hold times on the telephone, sending your provider a message by [facility] may be a faster and more efficient way to get a response.  Please allow 48 business hours for a response.  Please remember that this is for non-urgent requests.  _______________________________________________________  Dennis Bast have been scheduled for a colonoscopy. Please follow written instructions given to you at your visit today.  Please pick up your prep supplies at the pharmacy within the next 1-3 days. If you use inhalers (even only as needed), please bring them with you on the day of your procedure.  Due to recent changes in healthcare laws, you may see the results of your imaging and laboratory studies on MyChart before your provider has had a chance to review them.  We understand that in some cases there may be results that are confusing or concerning to you. Not all laboratory results come back in the same time frame and the provider may be waiting for multiple results in order to interpret others.  Please give [place] 48 hours in order for your provider to thoroughly review all the results before contacting the office for clarification of your results.   Thank you for entrusting me with your care and choosing [facility].  Dr Ardis Hughs

## 2021-12-31 ENCOUNTER — Ambulatory Visit (AMBULATORY_SURGERY_CENTER): Payer: Medicare Other | Admitting: Gastroenterology

## 2021-12-31 ENCOUNTER — Encounter: Payer: Self-pay | Admitting: Gastroenterology

## 2021-12-31 VITALS — BP 145/59 | HR 64 | Temp 97.7°F | Resp 13 | Ht 72.0 in | Wt 227.0 lb

## 2021-12-31 DIAGNOSIS — Z09 Encounter for follow-up examination after completed treatment for conditions other than malignant neoplasm: Secondary | ICD-10-CM | POA: Diagnosis not present

## 2021-12-31 DIAGNOSIS — D124 Benign neoplasm of descending colon: Secondary | ICD-10-CM | POA: Diagnosis not present

## 2021-12-31 DIAGNOSIS — D122 Benign neoplasm of ascending colon: Secondary | ICD-10-CM

## 2021-12-31 DIAGNOSIS — R195 Other fecal abnormalities: Secondary | ICD-10-CM

## 2021-12-31 DIAGNOSIS — E109 Type 1 diabetes mellitus without complications: Secondary | ICD-10-CM | POA: Diagnosis not present

## 2021-12-31 DIAGNOSIS — Z86010 Personal history of colonic polyps: Secondary | ICD-10-CM

## 2021-12-31 DIAGNOSIS — N184 Chronic kidney disease, stage 4 (severe): Secondary | ICD-10-CM | POA: Diagnosis not present

## 2021-12-31 MED ORDER — SODIUM CHLORIDE 0.9 % IV SOLN: 500.0000 mL | Freq: Once | INTRAVENOUS | Status: DC

## 2021-12-31 NOTE — Op Note (Signed)
[facility] Patient Name: [name] [name] Procedure Date: 12/31/2021 9:52 AM MRN: [id number] Endoscopist: Milus Banister , MD Age: 76 Referring MD:  Date of Birth: 05/11/1946 Gender: Male Account #: [id number] Procedure:                Colonoscopy Indications:              High risk colon cancer surveillance: Personal                            history of colonic polyps; colonoscopy June 2020                            two subcentimeter polyps were removed. The                            examination was otherwise normal. Pathology                            showed adenomatous polyps. Medicines:                Monitored Anesthesia Care Procedure:                Pre-Anesthesia Assessment:                           - Prior to the procedure, a History and Physical                            was performed, and patient medications and                            allergies were reviewed. The patient's tolerance of                            previous anesthesia was also reviewed. The risks                            and benefits of the procedure and the sedation                            options and risks were discussed with the patient.                            All questions were answered, and informed consent                            was obtained. Prior Anticoagulants: The patient has                            taken no previous anticoagulant or antiplatelet                            agents. ASA Grade Assessment: II - A patient with  mild systemic disease. After reviewing the risks                            and benefits, the patient was deemed in                            satisfactory condition to undergo the procedure.                           After obtaining informed consent, the colonoscope                            was passed under direct vision. Throughout the                            procedure, the patient's blood pressure, pulse,  and                            oxygen saturations were monitored continuously. The                            CF [id number] #[id number] was introduced through the anus                            and advanced to the the cecum, identified by                            appendiceal orifice and ileocecal valve. The                            colonoscopy was performed without difficulty. The                            patient tolerated the procedure well. The quality                            of the bowel preparation was good. The ileocecal                            valve, appendiceal orifice, and rectum were                            photographed. Scope In: 10:16:52 AM Scope Out: 10:28:02 AM Scope Withdrawal Time: 0 hours 8 minutes 17 seconds  Total Procedure Duration: 0 hours 11 minutes 10 seconds  Findings:                 A 2 mm polyp was found in the ascending colon. The                            polyp was sessile. The polyp was removed with a                            cold biopsy forceps. Resection and retrieval were  complete.                           A 4 mm polyp was found in the descending colon. The                            polyp was sessile. The polyp was removed with a                            cold snare. Resection and retrieval were complete.                           External and internal hemorrhoids were found. The                            hemorrhoids were small.                           The exam was otherwise without abnormality on                            direct and retroflexion views. Complications:            No immediate complications. Estimated blood loss:                            None. Estimated Blood Loss:     Estimated blood loss: none. Impression:               - One 2 mm polyp in the ascending colon, removed                            with a cold biopsy forceps. Resected and retrieved.                           - One 4 mm polyp in  the descending colon, removed                            with a cold snare. Resected and retrieved.                           - External and internal hemorrhoids.                           - The examination was otherwise normal on direct                            and retroflexion views. Recommendation:           - Patient has a contact number available for                            emergencies. The signs and symptoms of potential                            delayed complications  were discussed with the                            patient. Return to normal activities tomorrow.                            Written discharge instructions were provided to the                            patient.                           - Resume previous diet.                           - Continue present medications.                           - Await pathology results. Milus Banister, MD 12/31/2021 10:33:52 AM This report has been signed electronically.

## 2021-12-31 NOTE — Progress Notes (Signed)
Vs by DT in ADM  Pt's states no medical or surgical changes since previsit or office visit.

## 2021-12-31 NOTE — Patient Instructions (Signed)
Handouts Provided:  Polyps  YOU HAD AN ENDOSCOPIC PROCEDURE TODAY AT THE [facility]:   Refer to the procedure report that was given to you for any specific questions about what was found during the examination.  If the procedure report does not answer your questions, please call your gastroenterologist to clarify.  If you requested that your care partner not be given the details of your procedure findings, then the procedure report has been included in a sealed envelope for you to review at your convenience later.  YOU SHOULD EXPECT: Some feelings of bloating in the abdomen. Passage of more gas than usual.  Walking can help get rid of the air that was put into your GI tract during the procedure and reduce the bloating. If you had a lower endoscopy (such as a colonoscopy or flexible sigmoidoscopy) you may notice spotting of blood in your stool or on the toilet paper. If you underwent a bowel prep for your procedure, you may not have a normal bowel movement for a few days.  Please Note:  You might notice some irritation and congestion in your nose or some drainage.  This is from the oxygen used during your procedure.  There is no need for concern and it should clear up in a day or so.  SYMPTOMS TO REPORT IMMEDIATELY:  Following lower endoscopy (colonoscopy or flexible sigmoidoscopy):  Excessive amounts of blood in the stool  Significant tenderness or worsening of abdominal pains  Swelling of the abdomen that is new, acute  Fever of 100F or higher  For urgent or emergent issues, a gastroenterologist can be reached at any hour by calling [phone]. Do not use MyChart messaging for urgent concerns.    DIET:  We do recommend a small meal at first, but then you may proceed to your regular diet.  Drink plenty of fluids but you should avoid alcoholic beverages for 24 hours.  ACTIVITY:  You should plan to take it easy for the rest of today and you should NOT DRIVE or use heavy  machinery until tomorrow (because of the sedation medicines used during the test).    FOLLOW UP: Our staff will call the number listed on your records the next business day following your procedure.  We will call around 7:15- 8:00 am to check on you and address any questions or concerns that you may have regarding the information given to you following your procedure. If we do not reach you, we will leave a message.  If you develop any symptoms (ie: fever, flu-like symptoms, shortness of breath, cough etc.) before then, please call [phone].  If you test positive for Covid 19 in the 2 weeks post procedure, please call and report this information to us.    If any biopsies were taken you will be contacted by phone or by letter within the next 1-3 weeks.  Please call us at [phone] if you have not heard about the biopsies in 3 weeks.    SIGNATURES/CONFIDENTIALITY: You and/or your care partner have signed paperwork which will be entered into your electronic medical record.  These signatures attest to the fact that that the information above on your After Visit Summary has been reviewed and is understood.  Full responsibility of the confidentiality of this discharge information lies with you and/or your care-partner.  

## 2021-12-31 NOTE — Progress Notes (Signed)
  The recent H&P (dated 2 days ago) was reviewed, the patient was examined and there is no change in the patients condition since that H&P was completed.   [name] [name]  12/31/2021, 10:12 AM

## 2021-12-31 NOTE — Progress Notes (Signed)
Report to PACU, RN, vss, BBS= Clear.  

## 2021-12-31 NOTE — Progress Notes (Signed)
Called to room to assist during endoscopic procedure.  Patient ID and intended procedure confirmed with present staff. Received instructions for my participation in the procedure from the performing physician.  

## 2022-01-03 ENCOUNTER — Telehealth: Payer: Self-pay | Admitting: *Deleted

## 2022-01-03 NOTE — Telephone Encounter (Signed)
No answer on first attempt follow up call. Left message.  ?

## 2022-01-05 ENCOUNTER — Ambulatory Visit (INDEPENDENT_AMBULATORY_CARE_PROVIDER_SITE_OTHER): Payer: Medicare Other | Admitting: Rehabilitative and Restorative Service Providers"

## 2022-01-05 ENCOUNTER — Encounter: Payer: Self-pay | Admitting: Rehabilitative and Restorative Service Providers"

## 2022-01-05 DIAGNOSIS — M25561 Pain in right knee: Secondary | ICD-10-CM | POA: Diagnosis not present

## 2022-01-05 DIAGNOSIS — M25661 Stiffness of right knee, not elsewhere classified: Secondary | ICD-10-CM | POA: Diagnosis not present

## 2022-01-05 DIAGNOSIS — M6281 Muscle weakness (generalized): Secondary | ICD-10-CM

## 2022-01-05 DIAGNOSIS — R262 Difficulty in walking, not elsewhere classified: Secondary | ICD-10-CM | POA: Diagnosis not present

## 2022-01-05 NOTE — Therapy (Signed)
OUTPATIENT PHYSICAL THERAPY LOWER EXTREMITY EVALUATION   Patient Name: [name] [name] MRN: [id number] DOB:10-17-1945, 76 y.o., male Today's Date: 01/05/2022   PT End of Session - 01/05/22 1332     Visit Number 1    Number of Visits 16    PT Start Time 0849    PT Stop Time 0931    PT Time Calculation (min) 42 min    Activity Tolerance Patient tolerated treatment well;No increased pain    Behavior During Therapy [facility] for tasks assessed/performed             Past Medical History:  Diagnosis Date   Anemia    Cataract    DIABETES MELLITUS, TYPE II 01/02/2007   HYPERLIPIDEMIA 06/18/2007   HYPERTENSION 01/02/2007   RENAL INSUFFICIENCY 02/05/2008   Stroke ([place]) 2019   Past Surgical History:  Procedure Laterality Date   LOOP RECORDER INSERTION N/A 07/11/2017   Procedure: LOOP RECORDER INSERTION;  Surgeon: Thompson Grayer, MD;  Location: [facility];  Service: Cardiovascular;  Laterality: N/A;   SHOULDER SURGERY     left   TEE WITHOUT CARDIOVERSION N/A 07/11/2017   Procedure: TRANSESOPHAGEAL ECHOCARDIOGRAM (TEE);  Surgeon: Dorothy Spark, MD;  Location: [facility];  Service: Cardiovascular;  Laterality: N/A;   TRANSURETHRAL RESECTION OF PROSTATE     history of retention/hematuria    Patient Active Problem List   Diagnosis Date Noted   Strain of quadriceps, right, initial encounter 12/10/2021   Acute pain of right knee 11/24/2021   Drug-induced myopathy 01/08/2020   Iron deficiency anemia 04/03/2019   Anemia of chronic renal failure, stage 4 (severe) ([place]) 04/03/2019   Late effect of cerebrovascular accident (CVA) 12/07/2017   Hypoglycemia    Uncontrolled hypertension    Hypokalemia    Acute ischemic left MCA stroke ([place]) 07/12/2017   Diabetes mellitus type 2 in obese ([place])    Stage 3 chronic kidney disease ([place])    Left middle cerebral artery stroke ([place]) 40/98/1191   Acute embolic stroke ([place])    Aphasia 07/08/2017   Gout 07/21/2011   Prostatitis, chronic  07/14/2010   Hyperlipidemia 06/18/2007   Hypertensive kidney disease 01/02/2007    PCP: Dorothyann Peng, NP  REFERRING PROVIDER: Aundra Dubin, PA-C  REFERRING DIAG: [phone] (ICD-10-CM) - Strain of quadriceps, right, initial encounter   THERAPY DIAG:  Difficulty in walking, not elsewhere classified - Plan: PT plan of care cert/re-cert  Muscle weakness (generalized) - Plan: PT plan of care cert/re-cert  Stiffness of right knee, not elsewhere classified - Plan: PT plan of care cert/re-cert  Acute pain of right knee - Plan: PT plan of care cert/re-cert  Rationale for Evaluation and Treatment Rehabilitation  ONSET DATE: Nov 12, 2021  SUBJECTIVE:   SUBJECTIVE STATEMENT: [name] [name] (goes by [name] [name]) fell on his stairs at home in May of this year, tearing his vastus intermedius of his R quadriceps.  He has been limited in all R LE WB function since that time.  PERTINENT HISTORY: Type 2 DM, renal insufficiency, previous stroke, HTN, HLD, L shoulder surgery  PAIN:  Are you having pain? Yes: NPRS scale: 0-5/10 Pain location: Inferior patellar pole Pain description: Ache Aggravating factors: Prolonged sitting Relieving factors: Pain medication  PRECAUTIONS: Other: Knee brace limited to 90 degrees flexion.  No flexion limitations noted on the referral although strength will be the emphasis  WEIGHT BEARING RESTRICTIONS No  FALLS:  Has patient fallen in last 6 months? Yes. Number of falls 1  LIVING ENVIRONMENT:  Lives with: lives with their family Lives in: House/apartment Stairs: Yes: Internal: 5 steps; Yes and External: 14 steps; Yes Has following equipment at home: None  OCCUPATION: Retired  PLOF: Independent  PATIENT GOALS Return to the gym   OBJECTIVE:   DIAGNOSTIC FINDINGS: High-grade quadriceps tendon tear primarily involving the vastus intermedius component with 1.6 cm retraction. There are few intact anterior, lateral, and medial fibers.   Nondisplaced  degenerative tearing of the posterior horn and body of the medial meniscus with substance loss.   Intrasubstance degenerative signal in the lateral meniscus with extension to the free edge in the lateral meniscal body, indicating a possible nondisplaced degenerative tear.   Tricompartment osteoarthritis, worst in the medial and patellofemoral compartments.   Large joint effusion.  PATIENT SURVEYS:  FOTO 47 (Goal 62 in 13 visits)  COGNITION:  Overall cognitive status: Within functional limits for tasks assessed     SENSATION: No complaints of peripheral pain or paresthesias  EDEMA:  Noted but not measured (mild)   LOWER EXTREMITY ROM:  Active ROM Right eval Left eval  Hip flexion    Hip extension    Hip abduction    Hip adduction    Hip internal rotation    Hip external rotation    Knee flexion 113 130  Knee extension 1 0  Ankle dorsiflexion    Ankle plantarflexion    Ankle inversion    Ankle eversion     (Blank rows = not tested)  LOWER EXTREMITY MMT:  Thigh girth assessed 15 cm proximal to the superior patellar pole: R 50 cm and L 51.5 cm  Right eval Left eval  Hip flexion    Hip extension    Hip abduction    Hip adduction    Hip internal rotation    Hip external rotation    Knee flexion    Knee extension    Ankle dorsiflexion    Ankle plantarflexion    Ankle inversion    Ankle eversion     (Blank rows = not tested)  GAIT: Distance walked: In the office Assistive device utilized:  Knee brace locked at 0-90 degrees Level of assistance: Complete Independence Comments: No mention of AROM limitations in eval.  Focus is on strength and not recommended to push AROM with a tear.    TODAY'S TREATMENT: 01/05/2022: Quadriceps sets 2 sets of 10 for 5 seconds Seated straight leg raises 5X each side 3 seconds (slow eccentrics and fight hard to avoid quadriceps lag)-only able to raise leg about 2 inches  Review exam findings, expectations with PT (strength  gains with better clarity about continued PT vs surgery in 4 weeks) and HEP   PATIENT EDUCATION:  Education details: See above Person educated: Patient and Spouse Education method: Explanation, Demonstration, Tactile cues, Verbal cues, and Handouts Education comprehension: verbalized understanding, returned demonstration, verbal cues required, tactile cues required, and needs further education   HOME EXERCISE PROGRAM: Access Code: [email or web address] URL: [email or web address] Date: 01/05/2022 Prepared by: Vista Mink  Exercises - Supine Quadricep Sets  - 3-5 x daily - 7 x weekly - 2-3 sets - 10 reps - 5 second hold - Small Range Straight Leg Raise  - 3-5 x daily - 7 x weekly - 1 sets - 5 reps - 3 seconds hold  ASSESSMENT:  CLINICAL IMPRESSION: Patient is a 76 y.o. male who was seen today for physical therapy evaluation and treatment for R quadriceps tendon tear with retraction (vastus intermedius).  He is in a brace  that limits flexion to 90 degrees and is need of quadriceps strengthening to see if he can avoid surgery for quadriceps tendon repair.  With good strength and functional progress, [name] [name] may be able to avoid surgery.  With inadequate progress, he will at least be in better shape for a quadriceps tendon repair.   OBJECTIVE IMPAIRMENTS Abnormal gait, decreased activity tolerance, decreased endurance, difficulty walking, decreased ROM, decreased strength, increased edema, impaired perceived functional ability, impaired flexibility, and pain.   ACTIVITY LIMITATIONS carrying, lifting, bending, standing, squatting, stairs, and locomotion level  PARTICIPATION LIMITATIONS: driving, community activity, and going to the gym  PERSONAL FACTORS Type 2 DM, renal insufficiency, previous stroke, HTN, HLD, previous L shoulder surgery are also affecting patient's functional outcome.   REHAB POTENTIAL: Good  CLINICAL DECISION MAKING: Stable/uncomplicated  EVALUATION COMPLEXITY:  Low   GOALS: Goals reviewed with patient? Yes  SHORT TERM GOALS: Target date: 02/02/2022  Improve R quadriceps strength as assessed by his ability to complete a set of 5 seated straight leg raises without quadriceps lag Baseline: Quadriceps lag evident on rep 1 Goal status: INITIAL  2.  [name] [name] will be independent with his day 1 HEP Baseline: Started 01/05/2022 Goal status: INITIAL  3.  [name] [name] will not need pain medication for his R knee Baseline: Prescription pain meds PRN Goal status: INITIAL   LONG TERM GOALS: Target date: 03/02/2022   Improve FOTO to 62 in 13 visits Baseline: 47 at visit 1 Goal status: INITIAL  2.  Improve R thigh/knee pain to consistently 0-2/10 on the Numeric Pain Rating Scale Baseline: 0-5/10 Goal status: INITIAL  3.  Improve R knee AROM for flexion to pain-free 130 degrees Baseline: 113 degrees with pain at end range Goal status: INITIAL  4.  Improve R quadriceps strength as assessed by improved thigh girth assessed 15 cm proximal to the superior patellar pole Baseline: 50 cm Goal status: INITIAL  5.  [name] [name] will be independent with his long-term HEP at DC Baseline: Started 01/05/2022 Goal status: INITIAL  PLAN: PT FREQUENCY: 2x/week  PT DURATION: 8 weeks  PLANNED INTERVENTIONS: Therapeutic exercises, Therapeutic activity, Neuromuscular re-education, Balance training, Gait training, Patient/Family education, Stair training, Electrical stimulation, Cryotherapy, Vasopneumatic device, and BFR  PLAN FOR NEXT SESSION: Review HEP, balance work, limited range step downs with slow eccentrics, limited range leg press with slow eccentrics.  [place] to end.   Farley Ly, PT, MPT 01/05/2022, 3:52 PM

## 2022-01-07 DIAGNOSIS — H0102A Squamous blepharitis right eye, upper and lower eyelids: Secondary | ICD-10-CM | POA: Diagnosis not present

## 2022-01-07 DIAGNOSIS — H0102B Squamous blepharitis left eye, upper and lower eyelids: Secondary | ICD-10-CM | POA: Diagnosis not present

## 2022-01-07 DIAGNOSIS — H35033 Hypertensive retinopathy, bilateral: Secondary | ICD-10-CM | POA: Diagnosis not present

## 2022-01-07 DIAGNOSIS — H353131 Nonexudative age-related macular degeneration, bilateral, early dry stage: Secondary | ICD-10-CM | POA: Diagnosis not present

## 2022-01-07 DIAGNOSIS — H2513 Age-related nuclear cataract, bilateral: Secondary | ICD-10-CM | POA: Diagnosis not present

## 2022-01-07 DIAGNOSIS — H34212 Partial retinal artery occlusion, left eye: Secondary | ICD-10-CM | POA: Diagnosis not present

## 2022-01-07 DIAGNOSIS — H40023 Open angle with borderline findings, high risk, bilateral: Secondary | ICD-10-CM | POA: Diagnosis not present

## 2022-01-10 ENCOUNTER — Other Ambulatory Visit: Payer: Self-pay | Admitting: Orthopaedic Surgery

## 2022-01-10 ENCOUNTER — Encounter: Payer: Self-pay | Admitting: Gastroenterology

## 2022-01-11 ENCOUNTER — Encounter: Payer: Medicare Other | Admitting: Physical Therapy

## 2022-01-12 ENCOUNTER — Ambulatory Visit
Admission: RE | Admit: 2022-01-12 | Discharge: 2022-01-12 | Disposition: A | Discharge status: Home / Self Care | Payer: Medicare Other | Source: Ambulatory Visit | Attending: Urology | Admitting: Urology

## 2022-01-12 DIAGNOSIS — N281 Cyst of kidney, acquired: Secondary | ICD-10-CM | POA: Diagnosis not present

## 2022-01-12 DIAGNOSIS — K7689 Other specified diseases of liver: Secondary | ICD-10-CM | POA: Diagnosis not present

## 2022-01-12 DIAGNOSIS — D49512 Neoplasm of unspecified behavior of left kidney: Secondary | ICD-10-CM

## 2022-01-13 ENCOUNTER — Ambulatory Visit: Payer: Medicare Other | Admitting: Orthopaedic Surgery

## 2022-01-13 ENCOUNTER — Encounter: Payer: Self-pay | Admitting: Orthopaedic Surgery

## 2022-01-13 DIAGNOSIS — S76111A Strain of right quadriceps muscle, fascia and tendon, initial encounter: Secondary | ICD-10-CM | POA: Diagnosis not present

## 2022-01-13 NOTE — Progress Notes (Signed)
Office Visit Note   Patient: [name] [name]           Date of Birth: [birth year]           MRN: [id number] Visit Date: 01/13/2022              Requested by: Dorothyann Peng, NP [address],  [place] [address] PCP: Dorothyann Peng, NP   Assessment & Plan: Visit Diagnoses:  1. Strain of quadriceps, right, initial encounter     Plan: Impression is right partial quadriceps tear.  Patient is progressing nicely.  He will continue wearing his playmaker brace but have unlocked this to 120 degrees.  He will continue with physical therapy.  Follow-up with [place] in 4 weeks time or we anticipate discontinuing the playmaker brace.  Call with concerns or questions in the meantime.  Follow-Up Instructions: Return in about 4 weeks (around 02/10/2022).   Orders:  No orders of the defined types were placed in this encounter.  No orders of the defined types were placed in this encounter.     Procedures: No procedures performed   Clinical Data: No additional findings.   Subjective: Chief Complaint  Patient presents with   Right Knee - Follow-up    Quadriceps strain    HPI patient is a pleasant 76 year old gentleman who comes in today weeks status post partial right quadriceps tear.  He has been doing well.  He has been wearing his playmaker brace locked from 0 to 90 degrees of flexion.  He has been in physical therapy making good progress.  He denies any pain.     Objective: Vital Signs: There were no vitals taken for this visit.    Ortho Exam right knee exam shows range of motion from 0 to 100 degrees.  He is able to straight leg raise.  He is neurovascular intact distally.  Specialty Comments:  No specialty comments available.  Imaging: No new imaging   PMFS History: Patient Active Problem List   Diagnosis Date Noted   Strain of quadriceps, right, initial encounter 12/10/2021   Acute pain of right knee 11/24/2021   Drug-induced myopathy 01/08/2020   Iron  deficiency anemia 04/03/2019   Anemia of chronic renal failure, stage 4 (severe) ([place]) 04/03/2019   Late effect of cerebrovascular accident (CVA) 12/07/2017   Hypoglycemia    Uncontrolled hypertension    Hypokalemia    Acute ischemic left MCA stroke ([place]) 07/12/2017   Diabetes mellitus type 2 in obese [facility])    Stage 3 chronic kidney disease ([place])    Left middle cerebral artery stroke ([place]) 10/93/2355   Acute embolic stroke ([place])    Aphasia 07/08/2017   Gout 07/21/2011   Prostatitis, chronic 07/14/2010   Hyperlipidemia 06/18/2007   Hypertensive kidney disease 01/02/2007   Past Medical History:  Diagnosis Date   Anemia    Cataract    DIABETES MELLITUS, TYPE II 01/02/2007   HYPERLIPIDEMIA 06/18/2007   HYPERTENSION 01/02/2007   RENAL INSUFFICIENCY 02/05/2008   Stroke ([place]) 2019    Family History  Problem Relation Age of Onset   Hypertension Mother    Diabetes Mother    Stroke Father    Stroke Maternal Grandmother    Colon cancer Neg Hx    Esophageal cancer Neg Hx    Rectal cancer Neg Hx    Stomach cancer Neg Hx     Past Surgical History:  Procedure Laterality Date   LOOP RECORDER INSERTION N/A 07/11/2017   Procedure: LOOP  RECORDER INSERTION;  Surgeon: Thompson Grayer, MD;  Location: [facility];  Service: Cardiovascular;  Laterality: N/A;   SHOULDER SURGERY     left   TEE WITHOUT CARDIOVERSION N/A 07/11/2017   Procedure: TRANSESOPHAGEAL ECHOCARDIOGRAM (TEE);  Surgeon: Dorothy Spark, MD;  Location: [facility];  Service: Cardiovascular;  Laterality: N/A;   TRANSURETHRAL RESECTION OF PROSTATE     history of retention/hematuria    Social History   Occupational History   Not on file  Tobacco Use   Smoking status: Never   Smokeless tobacco: Never  Vaping Use   Vaping Use: Never used  Substance and Sexual Activity   Alcohol use: No   Drug use: No   Sexual activity: Not on file

## 2022-01-14 ENCOUNTER — Encounter: Payer: Self-pay | Admitting: Rehabilitative and Restorative Service Providers"

## 2022-01-14 ENCOUNTER — Ambulatory Visit: Payer: Medicare Other | Admitting: Rehabilitative and Restorative Service Providers"

## 2022-01-14 DIAGNOSIS — R262 Difficulty in walking, not elsewhere classified: Secondary | ICD-10-CM

## 2022-01-14 DIAGNOSIS — M25661 Stiffness of right knee, not elsewhere classified: Secondary | ICD-10-CM | POA: Diagnosis not present

## 2022-01-14 DIAGNOSIS — M6281 Muscle weakness (generalized): Secondary | ICD-10-CM | POA: Diagnosis not present

## 2022-01-14 DIAGNOSIS — M25561 Pain in right knee: Secondary | ICD-10-CM

## 2022-01-14 NOTE — Therapy (Signed)
OUTPATIENT PHYSICAL THERAPY TREATMENT NOTE   Patient Name: [name] [name] MRN: [id number] DOB:08-Oct-1945, 76 y.o., male Today's Date: 01/14/2022  PCP: Dorothyann Peng, NP REFERRING PROVIDER: Aundra Dubin, PA-C  END OF SESSION:   PT End of Session - 01/14/22 1612     Visit Number 2    Number of Visits 16    PT Start Time 0802    PT Stop Time 0845    PT Time Calculation (min) 43 min    Activity Tolerance Patient tolerated treatment well;No increased pain;Patient limited by fatigue    Behavior During Therapy [facility] for tasks assessed/performed             Past Medical History:  Diagnosis Date   Anemia    Cataract    DIABETES MELLITUS, TYPE II 01/02/2007   HYPERLIPIDEMIA 06/18/2007   HYPERTENSION 01/02/2007   RENAL INSUFFICIENCY 02/05/2008   Stroke ([place]) 2019   Past Surgical History:  Procedure Laterality Date   LOOP RECORDER INSERTION N/A 07/11/2017   Procedure: LOOP RECORDER INSERTION;  Surgeon: Thompson Grayer, MD;  Location: [place] [facility];  Service: Cardiovascular;  Laterality: N/A;   SHOULDER SURGERY     left   TEE WITHOUT CARDIOVERSION N/A 07/11/2017   Procedure: TRANSESOPHAGEAL ECHOCARDIOGRAM (TEE);  Surgeon: Dorothy Spark, MD;  Location: [facility] ENDOSCOPY;  Service: Cardiovascular;  Laterality: N/A;   TRANSURETHRAL RESECTION OF PROSTATE     history of retention/hematuria    Patient Active Problem List   Diagnosis Date Noted   Strain of quadriceps, right, initial encounter 12/10/2021   Acute pain of right knee 11/24/2021   Drug-induced myopathy 01/08/2020   Iron deficiency anemia 04/03/2019   Anemia of chronic renal failure, stage 4 (severe) ([place]) 04/03/2019   Late effect of cerebrovascular accident (CVA) 12/07/2017   Hypoglycemia    Uncontrolled hypertension    Hypokalemia    Acute ischemic left MCA stroke ([place]) 07/12/2017   Diabetes mellitus type 2 in obese ([place])    Stage 3 chronic kidney disease ([place])    Left middle cerebral artery stroke ([place])  82/99/3716   Acute embolic stroke ([place])    Aphasia 07/08/2017   Gout 07/21/2011   Prostatitis, chronic 07/14/2010   Hyperlipidemia 06/18/2007   Hypertensive kidney disease 01/02/2007    REFERRING DIAG: R67.893Y (ICD-10-CM) - Strain of quadriceps, right, initial encounter   THERAPY DIAG:  Difficulty in walking, not elsewhere classified  Muscle weakness (generalized)  Stiffness of right knee, not elsewhere classified  Acute pain of right knee  Rationale for Evaluation and Treatment Rehabilitation  PERTINENT HISTORY: Type 2 DM, renal insufficiency, previous stroke, HTN, HLD, L shoulder surgery  PRECAUTIONS: Knee brace limited to 90 degrees flexion.  No flexion limitations noted on the referral although strength will be the emphasis  SUBJECTIVE: [name] [name] reports good early HEP compliance, although SLR was tough.  PAIN:  Are you having pain? Yes: NPRS scale: 0-5/10 Pain location: Inferior patellar pole Pain description: Ache Aggravating factors: Prolonged sitting Relieving factors: Pain medication   OBJECTIVE: (objective measures completed at initial evaluation unless otherwise dated)  OBJECTIVE:    DIAGNOSTIC FINDINGS: High-grade quadriceps tendon tear primarily involving the vastus intermedius component with 1.6 cm retraction. There are few intact anterior, lateral, and medial fibers.   Nondisplaced degenerative tearing of the posterior horn and body of the medial meniscus with substance loss.   Intrasubstance degenerative signal in the lateral meniscus with extension to the free edge in the lateral meniscal body, indicating a possible nondisplaced  degenerative tear.   Tricompartment osteoarthritis, worst in the medial and patellofemoral compartments.   Large joint effusion.   PATIENT SURVEYS:  FOTO 47 (Goal 62 in 13 visits)   COGNITION:           Overall cognitive status: Within functional limits for tasks assessed                          SENSATION: No  complaints of peripheral pain or paresthesias   EDEMA:  Noted but not measured (mild)     LOWER EXTREMITY ROM:   Active ROM Right eval Left eval  Hip flexion      Hip extension      Hip abduction      Hip adduction      Hip internal rotation      Hip external rotation      Knee flexion 113 130  Knee extension 1 0  Ankle dorsiflexion      Ankle plantarflexion      Ankle inversion      Ankle eversion       (Blank rows = not tested)   LOWER EXTREMITY MMT:   Thigh girth assessed 15 cm proximal to the superior patellar pole: R 50 cm and L 51.5 cm   Right eval Left eval  Hip flexion      Hip extension      Hip abduction      Hip adduction      Hip internal rotation      Hip external rotation      Knee flexion      Knee extension      Ankle dorsiflexion      Ankle plantarflexion      Ankle inversion      Ankle eversion       (Blank rows = not tested)   GAIT: Distance walked: In the office Assistive device utilized:  Knee brace locked at 0-90 degrees Level of assistance: Complete Independence Comments: No mention of AROM limitations in eval.  Focus is on strength and not recommended to push AROM with a tear.       TODAY'S TREATMENT: 01/14/2022: Quadriceps sets 3 sets of 10 for 5 seconds Seated straight leg raises 3X each side 3 seconds (slow eccentrics and fight hard to avoid quadriceps lag)-only able to raise leg about 1-2 inches    Functional Activities for sit to stand and stairs: Step-up/down off 2 and 4 inch step 10X each slow eccentrics Leg Press double leg 75# 10X slow eccentrics and R leg only 25# 10X slow eccentrics   01/05/2022: Quadriceps sets 2 sets of 10 for 5 seconds Seated straight leg raises 5X each side 3 seconds (slow eccentrics and fight hard to avoid quadriceps lag)-only able to raise leg about 2 inches  Review exam findings, expectations with PT (strength gains with better clarity about continued PT vs surgery in 4 weeks) and HEP      PATIENT EDUCATION:  Education details: See above Person educated: Patient and Spouse Education method: Explanation, Demonstration, Tactile cues, Verbal cues, and Handouts Education comprehension: verbalized understanding, returned demonstration, verbal cues required, tactile cues required, and needs further education     HOME EXERCISE PROGRAM: Access Code: [email or web address] URL: [email or web address] Date: 01/14/2022 Prepared by: Vista Mink  Exercises - Supine Quadricep Sets  - 3-5 x daily - 7 x weekly - 2-3 sets - 10 reps - 5 second hold - Seated Straight Leg Raise  -  3-5 x daily - 1 x weekly - 1 sets - 5 reps - 3 seconds hold - Tandem Stance  - 2-3 x daily - 7 x weekly - 1 sets - 5 reps - 20 second hold    ASSESSMENT:   CLINICAL IMPRESSION: [name] [name] did a good job with progressions in the clinic today.  He is in a brace that limits flexion to 90 degrees and is need of quadriceps strengthening to see if he can avoid surgery for quadriceps tendon repair.  With good strength and functional progress, [name] [name] may be able to avoid surgery.  With inadequate progress, he will at least be in better shape for a quadriceps tendon repair.  Focus remains appropriate strength progressions within an appropriate range (0-90 for now) to improve strength, improve function and decrease pain.     OBJECTIVE IMPAIRMENTS Abnormal gait, decreased activity tolerance, decreased endurance, difficulty walking, decreased ROM, decreased strength, increased edema, impaired perceived functional ability, impaired flexibility, and pain.    ACTIVITY LIMITATIONS carrying, lifting, bending, standing, squatting, stairs, and locomotion level   PARTICIPATION LIMITATIONS: driving, community activity, and going to the gym   PERSONAL FACTORS Type 2 DM, renal insufficiency, previous stroke, HTN, HLD, previous L shoulder surgery are also affecting patient's functional outcome.    REHAB POTENTIAL: Good   CLINICAL  DECISION MAKING: Stable/uncomplicated   EVALUATION COMPLEXITY: Low     GOALS: Goals reviewed with patient? Yes   SHORT TERM GOALS: Target date: 02/02/2022  Improve R quadriceps strength as assessed by his ability to complete a set of 5 seated straight leg raises without quadriceps lag Baseline: Quadriceps lag evident on rep 1 Goal status: INITIAL   2.  [name] [name] will be independent with his day 1 HEP Baseline: Started 01/05/2022 Goal status: On Going 01/14/2022   3.  [name] [name] will not need pain medication for his R knee Baseline: Prescription pain meds PRN Goal status: INITIAL     LONG TERM GOALS: Target date: 03/02/2022    Improve FOTO to 62 in 13 visits Baseline: 47 at visit 1 Goal status: INITIAL   2.  Improve R thigh/knee pain to consistently 0-2/10 on the Numeric Pain Rating Scale Baseline: 0-5/10 Goal status: INITIAL   3.  Improve R knee AROM for flexion to pain-free 130 degrees Baseline: 113 degrees with pain at end range Goal status: INITIAL   4.  Improve R quadriceps strength as assessed by improved thigh girth assessed 15 cm proximal to the superior patellar pole Baseline: 50 cm Goal status: INITIAL   5.  [name] [name] will be independent with his long-term HEP at DC Baseline: Started 01/05/2022 Goal status: INITIAL   PLAN: PT FREQUENCY: 2x/week   PT DURATION: 8 weeks   PLANNED INTERVENTIONS: Therapeutic exercises, Therapeutic activity, Neuromuscular re-education, Balance training, Gait training, Patient/Family education, Stair training, Electrical stimulation, Cryotherapy, Vasopneumatic device, and BFR   PLAN FOR NEXT SESSION: Review HEP, balance work, limited range step downs with slow eccentrics, limited range leg press with slow eccentrics.  Vaso to end.      Farley Ly, PT, MPT 01/14/2022, 4:19 PM

## 2022-01-17 ENCOUNTER — Telehealth: Payer: Self-pay | Admitting: Orthopaedic Surgery

## 2022-01-17 NOTE — Telephone Encounter (Signed)
Pt's wife called requesting to contact pharmacy with pre auth to release tramadol. Pt phone number is [phone]

## 2022-01-18 ENCOUNTER — Other Ambulatory Visit: Payer: Self-pay | Admitting: Physician Assistant

## 2022-01-18 MED ORDER — TRAMADOL HCL 50 MG PO TABS: 50.0000 mg | ORAL_TABLET | Freq: Every day | ORAL | 0 refills | Status: DC | PRN

## 2022-01-18 NOTE — Telephone Encounter (Signed)
Sent in

## 2022-01-19 ENCOUNTER — Ambulatory Visit: Payer: Medicare Other | Admitting: Rehabilitative and Restorative Service Providers"

## 2022-01-19 ENCOUNTER — Encounter: Payer: Self-pay | Admitting: Rehabilitative and Restorative Service Providers"

## 2022-01-19 DIAGNOSIS — M6281 Muscle weakness (generalized): Secondary | ICD-10-CM | POA: Diagnosis not present

## 2022-01-19 DIAGNOSIS — R262 Difficulty in walking, not elsewhere classified: Secondary | ICD-10-CM

## 2022-01-19 DIAGNOSIS — M25561 Pain in right knee: Secondary | ICD-10-CM | POA: Diagnosis not present

## 2022-01-19 DIAGNOSIS — M25661 Stiffness of right knee, not elsewhere classified: Secondary | ICD-10-CM

## 2022-01-19 NOTE — Therapy (Signed)
OUTPATIENT PHYSICAL THERAPY TREATMENT NOTE   Patient Name: [name] [name] MRN: [id number] DOB:1946/02/16, 76 y.o., male Today's Date: 01/19/2022  PCP: Dorothyann Peng, NP REFERRING PROVIDER: Aundra Dubin, PA-C  END OF SESSION:   PT End of Session - 01/19/22 1434     Visit Number 3    Number of Visits 16    PT Start Time 5465    PT Stop Time 1510    PT Time Calculation (min) 39 min    Activity Tolerance Patient tolerated treatment well;No increased pain;Patient limited by fatigue    Behavior During Therapy [facility] for tasks assessed/performed              Past Medical History:  Diagnosis Date   Anemia    Cataract    DIABETES MELLITUS, TYPE II 01/02/2007   HYPERLIPIDEMIA 06/18/2007   HYPERTENSION 01/02/2007   RENAL INSUFFICIENCY 02/05/2008   Stroke ([place]) 2019   Past Surgical History:  Procedure Laterality Date   LOOP RECORDER INSERTION N/A 07/11/2017   Procedure: LOOP RECORDER INSERTION;  Surgeon: Thompson Grayer, MD;  Location: [place] [facility];  Service: Cardiovascular;  Laterality: N/A;   SHOULDER SURGERY     left   TEE WITHOUT CARDIOVERSION N/A 07/11/2017   Procedure: TRANSESOPHAGEAL ECHOCARDIOGRAM (TEE);  Surgeon: Dorothy Spark, MD;  Location: [facility] ENDOSCOPY;  Service: Cardiovascular;  Laterality: N/A;   TRANSURETHRAL RESECTION OF PROSTATE     history of retention/hematuria    Patient Active Problem List   Diagnosis Date Noted   Strain of quadriceps, right, initial encounter 12/10/2021   Acute pain of right knee 11/24/2021   Drug-induced myopathy 01/08/2020   Iron deficiency anemia 04/03/2019   Anemia of chronic renal failure, stage 4 (severe) ([place]) 04/03/2019   Late effect of cerebrovascular accident (CVA) 12/07/2017   Hypoglycemia    Uncontrolled hypertension    Hypokalemia    Acute ischemic left MCA stroke ([place]) 07/12/2017   Diabetes mellitus type 2 in obese ([place])    Stage 3 chronic kidney disease ([place])    Left middle cerebral artery stroke ([place])  03/54/6568   Acute embolic stroke ([place])    Aphasia 07/08/2017   Gout 07/21/2011   Prostatitis, chronic 07/14/2010   Hyperlipidemia 06/18/2007   Hypertensive kidney disease 01/02/2007    REFERRING DIAG: L27.517G (ICD-10-CM) - Strain of quadriceps, right, initial encounter   THERAPY DIAG:  Difficulty in walking, not elsewhere classified  Muscle weakness (generalized)  Stiffness of right knee, not elsewhere classified  Acute pain of right knee  Rationale for Evaluation and Treatment Rehabilitation  PERTINENT HISTORY: Type 2 DM, renal insufficiency, previous stroke, HTN, HLD, L shoulder surgery  PRECAUTIONS: Knee brace limited to 90 degrees flexion.  No flexion limitations noted on the referral although strength will be the emphasis  SUBJECTIVE: [name] [name] reports he is getting around 75 quadriceps sets done with his HEP daily (100 recommended).  PAIN:  Are you having pain? Yes: NPRS scale: 0-5/10 Pain location: Inferior patellar pole Pain description: Ache Aggravating factors: Prolonged sitting Relieving factors: Pain medication   OBJECTIVE: (objective measures completed at initial evaluation unless otherwise dated)  OBJECTIVE:    DIAGNOSTIC FINDINGS: High-grade quadriceps tendon tear primarily involving the vastus intermedius component with 1.6 cm retraction. There are few intact anterior, lateral, and medial fibers.   Nondisplaced degenerative tearing of the posterior horn and body of the medial meniscus with substance loss.   Intrasubstance degenerative signal in the lateral meniscus with extension to the free edge in the  lateral meniscal body, indicating a possible nondisplaced degenerative tear.   Tricompartment osteoarthritis, worst in the medial and patellofemoral compartments.   Large joint effusion.   PATIENT SURVEYS:  FOTO 47 (Goal 62 in 13 visits)   COGNITION:           Overall cognitive status: Within functional limits for tasks assessed                           SENSATION: No complaints of peripheral pain or paresthesias   EDEMA:  Noted but not measured (mild)     LOWER EXTREMITY ROM:   Active ROM Right eval Left eval  Hip flexion      Hip extension      Hip abduction      Hip adduction      Hip internal rotation      Hip external rotation      Knee flexion 113 130  Knee extension 1 0  Ankle dorsiflexion      Ankle plantarflexion      Ankle inversion      Ankle eversion       (Blank rows = not tested)   LOWER EXTREMITY MMT:   Thigh girth assessed 15 cm proximal to the superior patellar pole: R 50 cm and L 51.5 cm   Right eval Left eval  Hip flexion      Hip extension      Hip abduction      Hip adduction      Hip internal rotation      Hip external rotation      Knee flexion      Knee extension      Ankle dorsiflexion      Ankle plantarflexion      Ankle inversion      Ankle eversion       (Blank rows = not tested)   GAIT: Distance walked: In the office Assistive device utilized:  Knee brace locked at 0-90 degrees Level of assistance: Complete Independence Comments: No mention of AROM limitations in eval.  Focus is on strength and not recommended to push AROM with a tear.       TODAY'S TREATMENT: 01/19/2022: Quadriceps sets 3 sets of 10 for 5 seconds Seated straight leg raises 5X each side 3 seconds (slow eccentrics and fight hard to avoid quadriceps lag)-only able to raise leg about 1-2 inches, better than last visit   Functional Activities for sit to stand and stairs: Step-up/down off 2 and 4 inch step 10X each slow eccentrics Leg Press double leg 75# 10X slow eccentrics and R leg only 25# 10X slow eccentrics Sit to stand 5X slow eccentrics from high hi/low table with hands   Neuromuscular re-education: Tandem balance eyes open; head turning; eyes closed   01/14/2022: Quadriceps sets 3 sets of 10 for 5 seconds Seated straight leg raises 3X each side 3 seconds (slow eccentrics and fight hard to  avoid quadriceps lag)-only able to raise leg about 1-2 inches    Functional Activities for sit to stand and stairs: Step-up/down off 2 and 4 inch step 10X each slow eccentrics Leg Press double leg 75# 10X slow eccentrics and R leg only 25# 10X slow eccentrics   01/05/2022: Quadriceps sets 2 sets of 10 for 5 seconds Seated straight leg raises 5X each side 3 seconds (slow eccentrics and fight hard to avoid quadriceps lag)-only able to raise leg about 2 inches  Review exam findings,  expectations with PT (strength gains with better clarity about continued PT vs surgery in 4 weeks) and HEP     PATIENT EDUCATION:  Education details: See above Person educated: Patient and Spouse Education method: Explanation, Demonstration, Tactile cues, Verbal cues, and Handouts Education comprehension: verbalized understanding, returned demonstration, verbal cues required, tactile cues required, and needs further education     HOME EXERCISE PROGRAM: Access Code: [email or web address] URL: [email or web address] Date: 01/14/2022 Prepared by: Vista Mink  Exercises - Supine Quadricep Sets  - 3-5 x daily - 7 x weekly - 2-3 sets - 10 reps - 5 second hold - Seated Straight Leg Raise  - 3-5 x daily - 1 x weekly - 1 sets - 5 reps - 3 seconds hold - Tandem Stance  - 2-3 x daily - 7 x weekly - 1 sets - 5 reps - 20 second hold    ASSESSMENT:   CLINICAL IMPRESSION: [name] [name] has a better quadriceps contraction as compared to his last visit.  I encouraged him to continue quadriceps sets and rapidly increase to 100/day to facilitate quadriceps strength and functional progression.  Focus remains appropriate strength progressions within an appropriate range (0-90 for now) to improve strength, improve function and decrease pain.     OBJECTIVE IMPAIRMENTS Abnormal gait, decreased activity tolerance, decreased endurance, difficulty walking, decreased ROM, decreased strength, increased edema, impaired perceived functional  ability, impaired flexibility, and pain.    ACTIVITY LIMITATIONS carrying, lifting, bending, standing, squatting, stairs, and locomotion level   PARTICIPATION LIMITATIONS: driving, community activity, and going to the gym   PERSONAL FACTORS Type 2 DM, renal insufficiency, previous stroke, HTN, HLD, previous L shoulder surgery are also affecting patient's functional outcome.    REHAB POTENTIAL: Good   CLINICAL DECISION MAKING: Stable/uncomplicated   EVALUATION COMPLEXITY: Low     GOALS: Goals reviewed with patient? Yes   SHORT TERM GOALS: Target date: 02/02/2022  Improve R quadriceps strength as assessed by his ability to complete a set of 5 seated straight leg raises without quadriceps lag Baseline: Quadriceps lag evident on rep 1 Goal status: On Going 01/19/2022   2.  [name] [name] will be independent with his day 1 HEP Baseline: Started 01/05/2022 Goal status: On Going 01/19/2022   3.  [name] [name] will not need pain medication for his R knee Baseline: Prescription pain meds PRN Goal status: 1X this week 01/19/2022     LONG TERM GOALS: Target date: 03/02/2022    Improve FOTO to 62 in 13 visits Baseline: 47 at visit 1 Goal status: INITIAL   2.  Improve R thigh/knee pain to consistently 0-2/10 on the Numeric Pain Rating Scale Baseline: 0-5/10 Goal status: INITIAL   3.  Improve R knee AROM for flexion to pain-free 130 degrees Baseline: 113 degrees with pain at end range Goal status: INITIAL   4.  Improve R quadriceps strength as assessed by improved thigh girth assessed 15 cm proximal to the superior patellar pole Baseline: 50 cm Goal status: INITIAL   5.  [name] [name] will be independent with his long-term HEP at [place] Baseline: Started 01/05/2022 Goal status: INITIAL   PLAN: PT FREQUENCY: 2x/week   PT DURATION: 8 weeks   PLANNED INTERVENTIONS: Therapeutic exercises, Therapeutic activity, Neuromuscular re-education, Balance training, Gait training, Patient/Family education, Stair training,  Electrical stimulation, Cryotherapy, Vasopneumatic device, and BFR   PLAN FOR NEXT SESSION: Review HEP, balance work, limited range step downs/up and over with slow eccentrics, limited range leg press with slow eccentrics.  Vaso to end as needed.  Farley Ly, PT, MPT 01/19/2022, 3:14 PM

## 2022-01-20 ENCOUNTER — Ambulatory Visit: Payer: Medicare Other | Admitting: Rehabilitative and Restorative Service Providers"

## 2022-01-20 ENCOUNTER — Encounter: Payer: Self-pay | Admitting: Rehabilitative and Restorative Service Providers"

## 2022-01-20 DIAGNOSIS — M6281 Muscle weakness (generalized): Secondary | ICD-10-CM | POA: Diagnosis not present

## 2022-01-20 DIAGNOSIS — M25561 Pain in right knee: Secondary | ICD-10-CM

## 2022-01-20 DIAGNOSIS — R262 Difficulty in walking, not elsewhere classified: Secondary | ICD-10-CM | POA: Diagnosis not present

## 2022-01-20 DIAGNOSIS — M25661 Stiffness of right knee, not elsewhere classified: Secondary | ICD-10-CM | POA: Diagnosis not present

## 2022-01-20 NOTE — Therapy (Signed)
OUTPATIENT PHYSICAL THERAPY TREATMENT NOTE   Patient Name: [name] [name] MRN: [id number] DOB:12/12/1945, 76 y.o., male Today's Date: 01/20/2022  PCP: Dorothyann Peng, NP REFERRING PROVIDER: Aundra Dubin, PA-C  END OF SESSION:   PT End of Session - 01/20/22 1304     Visit Number 4    Number of Visits 16    PT Start Time 1300    PT Stop Time 1340    PT Time Calculation (min) 40 min    Activity Tolerance Patient tolerated treatment well;No increased pain;Patient limited by fatigue    Behavior During Therapy [facility] for tasks assessed/performed             Past Medical History:  Diagnosis Date   Anemia    Cataract    DIABETES MELLITUS, TYPE II 01/02/2007   HYPERLIPIDEMIA 06/18/2007   HYPERTENSION 01/02/2007   RENAL INSUFFICIENCY 02/05/2008   Stroke ([place]) 2019   Past Surgical History:  Procedure Laterality Date   LOOP RECORDER INSERTION N/A 07/11/2017   Procedure: LOOP RECORDER INSERTION;  Surgeon: Thompson Grayer, MD;  Location: [facility];  Service: Cardiovascular;  Laterality: N/A;   SHOULDER SURGERY     left   TEE WITHOUT CARDIOVERSION N/A 07/11/2017   Procedure: TRANSESOPHAGEAL ECHOCARDIOGRAM (TEE);  Surgeon: Dorothy Spark, MD;  Location: [facility] ENDOSCOPY;  Service: Cardiovascular;  Laterality: N/A;   TRANSURETHRAL RESECTION OF PROSTATE     history of retention/hematuria    Patient Active Problem List   Diagnosis Date Noted   Strain of quadriceps, right, initial encounter 12/10/2021   Acute pain of right knee 11/24/2021   Drug-induced myopathy 01/08/2020   Iron deficiency anemia 04/03/2019   Anemia of chronic renal failure, stage 4 (severe) ([place]) 04/03/2019   Late effect of cerebrovascular accident (CVA) 12/07/2017   Hypoglycemia    Uncontrolled hypertension    Hypokalemia    Acute ischemic left MCA stroke ([place]) 07/12/2017   Diabetes mellitus type 2 in obese ([place])    Stage 3 chronic kidney disease ([place])    Left middle cerebral artery stroke ([place])  98/33/8250   Acute embolic stroke ([place])    Aphasia 07/08/2017   Gout 07/21/2011   Prostatitis, chronic 07/14/2010   Hyperlipidemia 06/18/2007   Hypertensive kidney disease 01/02/2007    REFERRING DIAG: N39.767H (ICD-10-CM) - Strain of quadriceps, right, initial encounter   THERAPY DIAG:  Difficulty in walking, not elsewhere classified  Muscle weakness (generalized)  Stiffness of right knee, not elsewhere classified  Acute pain of right knee  Rationale for Evaluation and Treatment Rehabilitation  PERTINENT HISTORY: Type 2 DM, renal insufficiency, previous stroke, HTN, HLD, L shoulder surgery  PRECAUTIONS: Knee brace limited to 90 degrees flexion.  No flexion limitations noted on the referral although strength will be the emphasis  SUBJECTIVE: [name] [name] reports no soreness or pain from yesterday.  "Just tired."  PAIN:  Are you having pain? Yes: NPRS scale: 0-5/10 Pain location: Inferior patellar pole Pain description: Ache Aggravating factors: Prolonged sitting Relieving factors: Pain medication   OBJECTIVE: (objective measures completed at initial evaluation unless otherwise dated)  OBJECTIVE:    DIAGNOSTIC FINDINGS: High-grade quadriceps tendon tear primarily involving the vastus intermedius component with 1.6 cm retraction. There are few intact anterior, lateral, and medial fibers.   Nondisplaced degenerative tearing of the posterior horn and body of the medial meniscus with substance loss.   Intrasubstance degenerative signal in the lateral meniscus with extension to the free edge in the lateral meniscal body, indicating a possible  nondisplaced degenerative tear.   Tricompartment osteoarthritis, worst in the medial and patellofemoral compartments.   Large joint effusion.   PATIENT SURVEYS:  FOTO 47 (Goal 62 in 13 visits)   COGNITION:           Overall cognitive status: Within functional limits for tasks assessed                          SENSATION: No  complaints of peripheral pain or paresthesias   EDEMA:  Noted but not measured (mild)     LOWER EXTREMITY ROM:   Active ROM Right eval Left eval  Hip flexion      Hip extension      Hip abduction      Hip adduction      Hip internal rotation      Hip external rotation      Knee flexion 113 130  Knee extension 1 0  Ankle dorsiflexion      Ankle plantarflexion      Ankle inversion      Ankle eversion       (Blank rows = not tested)   LOWER EXTREMITY MMT:   Thigh girth assessed 15 cm proximal to the superior patellar pole: R 50 cm and L 51.5 cm   Right eval Left eval  Hip flexion      Hip extension      Hip abduction      Hip adduction      Hip internal rotation      Hip external rotation      Knee flexion      Knee extension      Ankle dorsiflexion      Ankle plantarflexion      Ankle inversion      Ankle eversion       (Blank rows = not tested)   GAIT: Distance walked: In the office Assistive device utilized:  Knee brace locked at 0-90 degrees Level of assistance: Complete Independence Comments: No mention of AROM limitations in eval.  Focus is on strength and not recommended to push AROM with a tear.       TODAY'S TREATMENT: 01/20/2022: Quadriceps sets 3 sets of 10 for 5 seconds Seated straight leg raises 5X each side 3 seconds (slow eccentrics and fight hard to avoid quadriceps lag)-only able to raise leg about 1-2 inches, better than last visit   Functional Activities for sit to stand and stairs: Step-up and over and step-down off 2 and 4 inch step 10X each slow eccentrics Leg Press double leg 81# 10X slow eccentrics and R leg only 25# 10X slow eccentrics   Neuromuscular re-education: Tandem balance eyes open and single leg stance eyes open   01/19/2022: Quadriceps sets 3 sets of 10 for 5 seconds Seated straight leg raises 5X each side 3 seconds (slow eccentrics and fight hard to avoid quadriceps lag)-only able to raise leg about 1-2 inches, better  than last visit   Functional Activities for sit to stand and stairs: Step-up/down off 2 and 4 inch step 10X each slow eccentrics Leg Press double leg 75# 10X slow eccentrics and R leg only 25# 10X slow eccentrics Sit to stand 5X slow eccentrics from high hi/low table with hands   Neuromuscular re-education: Tandem balance eyes open; head turning; eyes closed   01/14/2022: Quadriceps sets 3 sets of 10 for 5 seconds Seated straight leg raises 3X each side 3 seconds (slow eccentrics and fight hard  to avoid quadriceps lag)-only able to raise leg about 1-2 inches    Functional Activities for sit to stand and stairs: Step-up/down off 2 and 4 inch step 10X each slow eccentrics Leg Press double leg 75# 10X slow eccentrics and R leg only 25# 10X slow eccentrics     PATIENT EDUCATION:  Education details: See above Person educated: Patient and Spouse Education method: Explanation, Demonstration, Tactile cues, Verbal cues, and Handouts Education comprehension: verbalized understanding, returned demonstration, verbal cues required, tactile cues required, and needs further education     HOME EXERCISE PROGRAM: Access Code: [email or web address] URL: [email or web address] Date: 01/14/2022 Prepared by: Vista Mink  Exercises - Supine Quadricep Sets  - 3-5 x daily - 7 x weekly - 2-3 sets - 10 reps - 5 second hold - Seated Straight Leg Raise  - 3-5 x daily - 1 x weekly - 1 sets - 5 reps - 3 seconds hold - Tandem Stance  - 2-3 x daily - 7 x weekly - 1 sets - 5 reps - 20 second hold    ASSESSMENT:   CLINICAL IMPRESSION: [name] [name] did a good job given his back to back appointments yesterday and today.  We were able to increase weight on double leg press and progress to single leg balance in the clinic.  Focus remains appropriate strength progressions within an appropriate range (0-90 for now) to improve strength, improve function and decrease pain.     OBJECTIVE IMPAIRMENTS Abnormal gait,  decreased activity tolerance, decreased endurance, difficulty walking, decreased ROM, decreased strength, increased edema, impaired perceived functional ability, impaired flexibility, and pain.    ACTIVITY LIMITATIONS carrying, lifting, bending, standing, squatting, stairs, and locomotion level   PARTICIPATION LIMITATIONS: driving, community activity, and going to the gym   PERSONAL FACTORS Type 2 DM, renal insufficiency, previous stroke, HTN, HLD, previous L shoulder surgery are also affecting patient's functional outcome.    REHAB POTENTIAL: Good   CLINICAL DECISION MAKING: Stable/uncomplicated   EVALUATION COMPLEXITY: Low     GOALS: Goals reviewed with patient? Yes   SHORT TERM GOALS: Target date: 02/02/2022  Improve R quadriceps strength as assessed by his ability to complete a set of 5 seated straight leg raises without quadriceps lag Baseline: Quadriceps lag evident on rep 1 Goal status: On Going 01/19/2022   2.  [name] [name] will be independent with his day 1 HEP Baseline: Started 01/05/2022 Goal status: On Going 01/19/2022   3.  [name] [name] will not need pain medication for his R knee Baseline: Prescription pain meds PRN Goal status: 1X this week 01/19/2022     LONG TERM GOALS: Target date: 03/02/2022    Improve FOTO to 62 in 13 visits Baseline: 47 at visit 1 Goal status: INITIAL   2.  Improve R thigh/knee pain to consistently 0-2/10 on the Numeric Pain Rating Scale Baseline: 0-5/10 Goal status: INITIAL   3.  Improve R knee AROM for flexion to pain-free 130 degrees Baseline: 113 degrees with pain at end range Goal status: INITIAL   4.  Improve R quadriceps strength as assessed by improved thigh girth assessed 15 cm proximal to the superior patellar pole Baseline: 50 cm Goal status: INITIAL   5.  [name] [name] will be independent with his long-term HEP at [place] Baseline: Started 01/05/2022 Goal status: INITIAL   PLAN: PT FREQUENCY: 2x/week   PT DURATION: 8 weeks   PLANNED  INTERVENTIONS: Therapeutic exercises, Therapeutic activity, Neuromuscular re-education, Balance training, Gait training, Patient/Family education, Stair training, Electrical stimulation, Cryotherapy, Vasopneumatic device, and BFR  PLAN FOR NEXT SESSION: Review HEP, balance work, limited range step downs/up and over with slow eccentrics, limited range leg press with slow eccentrics.  Vaso to end as needed.      Farley Ly, PT, MPT 01/20/2022, 1:43 PM

## 2022-01-21 ENCOUNTER — Other Ambulatory Visit: Payer: Self-pay | Admitting: Adult Health

## 2022-01-21 ENCOUNTER — Encounter: Payer: Medicare Other | Admitting: Rehabilitative and Restorative Service Providers"

## 2022-01-21 DIAGNOSIS — D49512 Neoplasm of unspecified behavior of left kidney: Secondary | ICD-10-CM | POA: Diagnosis not present

## 2022-01-21 DIAGNOSIS — E1122 Type 2 diabetes mellitus with diabetic chronic kidney disease: Secondary | ICD-10-CM

## 2022-01-25 ENCOUNTER — Encounter: Payer: Medicare Other | Admitting: Rehabilitative and Restorative Service Providers"

## 2022-01-27 ENCOUNTER — Encounter: Payer: Self-pay | Admitting: Rehabilitative and Restorative Service Providers"

## 2022-01-27 ENCOUNTER — Ambulatory Visit: Payer: Medicare Other | Admitting: Rehabilitative and Restorative Service Providers"

## 2022-01-27 DIAGNOSIS — R262 Difficulty in walking, not elsewhere classified: Secondary | ICD-10-CM

## 2022-01-27 DIAGNOSIS — M25561 Pain in right knee: Secondary | ICD-10-CM

## 2022-01-27 DIAGNOSIS — M25661 Stiffness of right knee, not elsewhere classified: Secondary | ICD-10-CM | POA: Diagnosis not present

## 2022-01-27 DIAGNOSIS — M6281 Muscle weakness (generalized): Secondary | ICD-10-CM

## 2022-01-27 NOTE — Therapy (Signed)
OUTPATIENT PHYSICAL THERAPY TREATMENT/PROGRESS NOTE   Patient Name: [name] [name] MRN: [id number] DOB:07/03/1946, 76 y.o., male Today's Date: 01/27/2022  PCP: Dorothyann Peng, NP REFERRING PROVIDER: Aundra Dubin, PA-C  Progress Note Reporting Period 01/05/2022 to 01/27/2022  See note below for Objective Data and Assessment of Progress/Goals.     END OF SESSION:   PT End of Session - 01/27/22 0843     Visit Number 5    Number of Visits 16    PT Start Time 0843    PT Stop Time 0926    PT Time Calculation (min) 43 min    Activity Tolerance Patient tolerated treatment well;No increased pain    Behavior During Therapy [facility] for tasks assessed/performed             Past Medical History:  Diagnosis Date   Anemia    Cataract    DIABETES MELLITUS, TYPE II 01/02/2007   HYPERLIPIDEMIA 06/18/2007   HYPERTENSION 01/02/2007   RENAL INSUFFICIENCY 02/05/2008   Stroke ([place]) 2019   Past Surgical History:  Procedure Laterality Date   LOOP RECORDER INSERTION N/A 07/11/2017   Procedure: LOOP RECORDER INSERTION;  Surgeon: Thompson Grayer, MD;  Location: [facility];  Service: Cardiovascular;  Laterality: N/A;   SHOULDER SURGERY     left   TEE WITHOUT CARDIOVERSION N/A 07/11/2017   Procedure: TRANSESOPHAGEAL ECHOCARDIOGRAM (TEE);  Surgeon: Dorothy Spark, MD;  Location: [facility] ENDOSCOPY;  Service: Cardiovascular;  Laterality: N/A;   TRANSURETHRAL RESECTION OF PROSTATE     history of retention/hematuria    Patient Active Problem List   Diagnosis Date Noted   Strain of quadriceps, right, initial encounter 12/10/2021   Acute pain of right knee 11/24/2021   Drug-induced myopathy 01/08/2020   Iron deficiency anemia 04/03/2019   Anemia of chronic renal failure, stage 4 (severe) ([place]) 04/03/2019   Late effect of cerebrovascular accident (CVA) 12/07/2017   Hypoglycemia    Uncontrolled hypertension    Hypokalemia    Acute ischemic left MCA stroke ([place]) 07/12/2017   Diabetes mellitus  type 2 in obese ([place])    Stage 3 chronic kidney disease ([place])    Left middle cerebral artery stroke ([place]) 83/25/4982   Acute embolic stroke ([place])    Aphasia 07/08/2017   Gout 07/21/2011   Prostatitis, chronic 07/14/2010   Hyperlipidemia 06/18/2007   Hypertensive kidney disease 01/02/2007    REFERRING DIAG: M41.583E (ICD-10-CM) - Strain of quadriceps, right, initial encounter   THERAPY DIAG:  Difficulty in walking, not elsewhere classified  Muscle weakness (generalized)  Stiffness of right knee, not elsewhere classified  Acute pain of right knee  Rationale for Evaluation and Treatment Rehabilitation  PERTINENT HISTORY: Type 2 DM, renal insufficiency, previous stroke, HTN, HLD, L shoulder surgery  PRECAUTIONS: Knee brace limited to 90 degrees flexion.  No flexion limitations noted on the referral although strength will be the emphasis  SUBJECTIVE: [name] reports things are getting a little easier on a daily and weekly basis.  HEP compliance has been good.  PAIN:  Are you having pain? Yes: NPRS scale: 0-4/10 Pain location: Inferior patellar pole and suprapatellar Pain description: Ache Aggravating factors: Prolonged sitting Relieving factors: Pain medication   OBJECTIVE: (objective measures completed at initial evaluation unless otherwise dated)  OBJECTIVE:    DIAGNOSTIC FINDINGS: High-grade quadriceps tendon tear primarily involving the vastus intermedius component with 1.6 cm retraction. There are few intact anterior, lateral, and medial fibers.   Nondisplaced degenerative tearing of the posterior horn and body  of the medial meniscus with substance loss.   Intrasubstance degenerative signal in the lateral meniscus with extension to the free edge in the lateral meniscal body, indicating a possible nondisplaced degenerative tear.   Tricompartment osteoarthritis, worst in the medial and patellofemoral compartments.   Large joint effusion.   PATIENT SURVEYS:   01/27/2022 FOTO 62 (Goal met) 01/05/2022 FOTO 47 (Goal 62 in 13 visits)   COGNITION:           Overall cognitive status: Within functional limits for tasks assessed                          SENSATION: No complaints of peripheral pain or paresthesias   EDEMA:  Noted but not measured (mild)     LOWER EXTREMITY ROM:   Active ROM Right eval Left eval Right 01/27/2022  Hip flexion       Hip extension       Hip abduction       Hip adduction       Hip internal rotation       Hip external rotation       Knee flexion 113 130 118  Knee extension 1 0 0  Ankle dorsiflexion       Ankle plantarflexion       Ankle inversion       Ankle eversion        (Blank rows = not tested)   LOWER EXTREMITY MMT:   Thigh girth assessed 15 cm proximal to the superior patellar pole: R 50 cm and L 51.5 cm   Right eval Left eval Thigh girth R vs L assessed 15 cm proximal to the superior patellar pole  R 52.5 cm and L 53 cm  Hip flexion       Hip extension       Hip abduction       Hip adduction       Hip internal rotation       Hip external rotation       Knee flexion       Knee extension       Ankle dorsiflexion       Ankle plantarflexion       Ankle inversion       Ankle eversion        (Blank rows = not tested)   GAIT: Distance walked: In the office Assistive device utilized:  Knee brace locked at 0-90 degrees Level of assistance: Complete Independence Comments: No mention of AROM limitations in eval.  Focus is on strength and not recommended to push AROM with a tear.       TODAY'S TREATMENT: 01/27/2022:   Functional Activities for sit to stand and stairs: Step-down off 4 inch step 2 sets of 10X each slow eccentrics Leg Press double leg 87# 15X slow eccentrics and R leg only 37# 10X slow eccentrics Sit to stand with UE use and slow eccentrics 2 sets of 5  Neuromuscular re-education: Tandem balance eyes open and single leg stance eyes open   01/20/2022: Quadriceps sets 3 sets of  10 for 5 seconds Seated straight leg raises 5X each side 3 seconds (slow eccentrics and fight hard to avoid quadriceps lag)-only able to raise leg about 1-2 inches, better than last visit   Functional Activities for sit to stand and stairs: Step-up and over and step-down off 2 and 4 inch step 10X each slow eccentrics Leg Press double leg 81# 10X slow  eccentrics and R leg only 25# 10X slow eccentrics   Neuromuscular re-education: Tandem balance eyes open and single leg stance eyes open   01/19/2022: Quadriceps sets 3 sets of 10 for 5 seconds Seated straight leg raises 5X each side 3 seconds (slow eccentrics and fight hard to avoid quadriceps lag)-only able to raise leg about 1-2 inches, better than last visit   Functional Activities for sit to stand and stairs: Step-up/down off 2 and 4 inch step 10X each slow eccentrics Leg Press double leg 75# 10X slow eccentrics and R leg only 25# 10X slow eccentrics Sit to stand 5X slow eccentrics from high hi/low table with hands   Neuromuscular re-education: Tandem balance eyes open; head turning; eyes closed      PATIENT EDUCATION:  Education details: See above Person educated: Patient and Spouse Education method: Explanation, Demonstration, Tactile cues, Verbal cues, and Handouts Education comprehension: verbalized understanding, returned demonstration, verbal cues required, tactile cues required, and needs further education     HOME EXERCISE PROGRAM:  Access Code: [email or web address] URL: [email or web address] Date: 01/27/2022 Prepared by: Pauletta Browns  Exercises - Supine Quadricep Sets  - 3-5 x daily - 7 x weekly - 2-3 sets - 10 reps - 5 second hold - Small Range Straight Leg Raise  - 3-5 x daily - 1 x weekly - 1 sets - 5 reps - 3 seconds hold - Tandem Stance  - 2-3 x daily - 7 x weekly - 1 sets - 5 reps - 20 second hold - Sit to Stand with Armchair  - 2 x daily - 7 x weekly - 1 sets - 5 reps   ASSESSMENT:   CLINICAL  IMPRESSION: Peyton Najjar is making great early self-reported functional progress with his supervised PT.  Thigh atrophy has objectively improved and he feels stronger with exercises and activities at home.  Focus remains on appropriate quadriceps strength progressions within an appropriate range (comfortable range) to improve strength and stability, improve function and decrease pain.     OBJECTIVE IMPAIRMENTS Abnormal gait, decreased activity tolerance, decreased endurance, difficulty walking, decreased ROM, decreased strength, increased edema, impaired perceived functional ability, impaired flexibility, and pain.    ACTIVITY LIMITATIONS carrying, lifting, bending, standing, squatting, stairs, and locomotion level   PARTICIPATION LIMITATIONS: driving, community activity, and going to the gym   PERSONAL FACTORS Type 2 DM, renal insufficiency, previous stroke, HTN, HLD, previous L shoulder surgery are also affecting patient's functional outcome.    REHAB POTENTIAL: Good   CLINICAL DECISION MAKING: Stable/uncomplicated   EVALUATION COMPLEXITY: Low     GOALS: Goals reviewed with patient? Yes   SHORT TERM GOALS: Target date: 02/02/2022  Improve R quadriceps strength as assessed by his ability to complete a set of 5 seated straight leg raises without quadriceps lag Baseline: Quadriceps lag evident on rep 1 Goal status: On Going 01/27/2022   2.  Peyton Najjar will be independent with his day 1 HEP Baseline: Started 01/05/2022 Goal status: Met 01/27/2022   3.  Peyton Najjar will not need pain medication for his R knee Baseline: Prescription pain meds PRN Goal status: 1X this week 01/27/2022     LONG TERM GOALS: Target date: 03/02/2022    Improve FOTO to 62 in 13 visits Baseline: 47 at visit 1 Goal status: Met 01/27/2022   2.  Improve R thigh/knee pain to consistently 0-2/10 on the Numeric Pain Rating Scale Baseline: 0-5/10 Goal status: On Going 01/27/2022   3.  Improve R knee AROM for flexion to pain-free 130  degrees Baseline: 113 degrees with pain at end range Goal status: On Going (118) 01/27/2022   4.  Improve R quadriceps strength as assessed by improved thigh girth assessed 15 cm proximal to the superior patellar pole Baseline: 50 cm Goal status: On Going, improved 01/27/2022   5.  [name] will be independent with his long-term HEP at [place] Baseline: Started 01/05/2022 Goal status: On Going 01/27/2022   PLAN: PT FREQUENCY: 2x/week   PT DURATION: 8 weeks   PLANNED INTERVENTIONS: Therapeutic exercises, Therapeutic activity, Neuromuscular re-education, Balance training, Gait training, Patient/Family education, Stair training, Electrical stimulation, Cryotherapy, Vasopneumatic device, and BFR   PLAN FOR NEXT SESSION: Progress functional quadriceps strength, balance work, limited range step downs/up and over with slow eccentrics, limited range leg press with slow eccentrics.  Vaso to end as needed.      Farley Ly, PT, MPT 01/27/2022, 2:40 PM

## 2022-02-03 ENCOUNTER — Ambulatory Visit: Payer: Medicare Other | Admitting: Orthopaedic Surgery

## 2022-02-03 ENCOUNTER — Ambulatory Visit: Payer: Medicare Other | Admitting: Rehabilitative and Restorative Service Providers"

## 2022-02-03 ENCOUNTER — Encounter: Payer: Self-pay | Admitting: Orthopaedic Surgery

## 2022-02-03 ENCOUNTER — Encounter: Payer: Self-pay | Admitting: Rehabilitative and Restorative Service Providers"

## 2022-02-03 DIAGNOSIS — S76111A Strain of right quadriceps muscle, fascia and tendon, initial encounter: Secondary | ICD-10-CM

## 2022-02-03 DIAGNOSIS — M25561 Pain in right knee: Secondary | ICD-10-CM

## 2022-02-03 DIAGNOSIS — M6281 Muscle weakness (generalized): Secondary | ICD-10-CM | POA: Diagnosis not present

## 2022-02-03 DIAGNOSIS — R262 Difficulty in walking, not elsewhere classified: Secondary | ICD-10-CM | POA: Diagnosis not present

## 2022-02-03 DIAGNOSIS — M25661 Stiffness of right knee, not elsewhere classified: Secondary | ICD-10-CM | POA: Diagnosis not present

## 2022-02-03 IMAGING — DX DG LUMBAR SPINE COMPLETE 4+V
5 series · 5 of 5 positions shown · non-contrast
Comparison: 03/22/2018

CLINICAL DATA: Chronic low back pain

EXAM:
LUMBAR SPINE - COMPLETE 4+ VIEW

[l-spine ap]
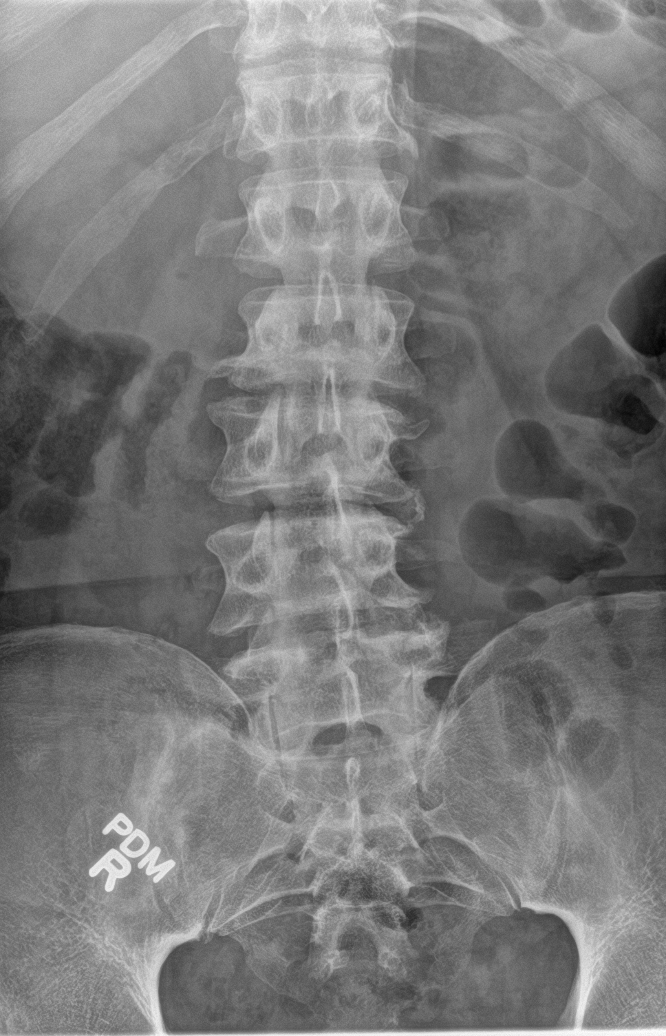

[l-spine obl (1 of 2)]
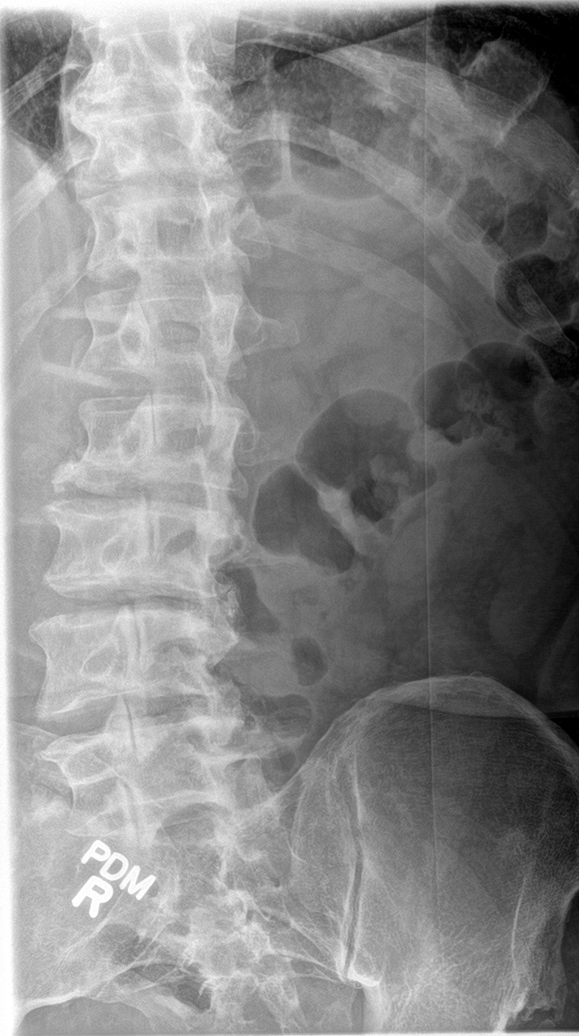

[l-spine obl (2 of 2)]
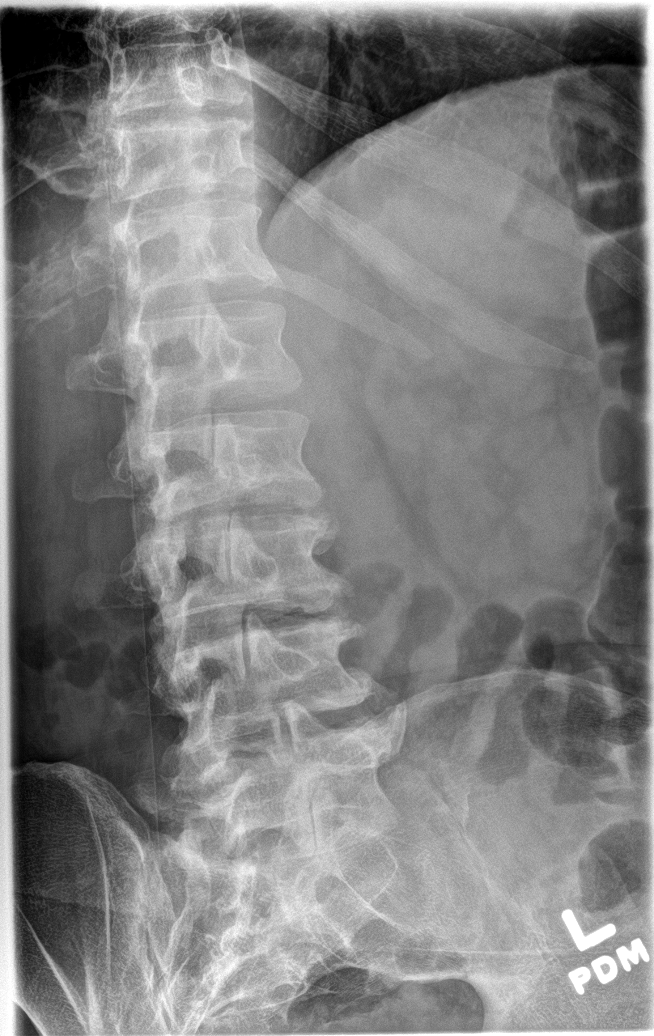

[l-spine lat]
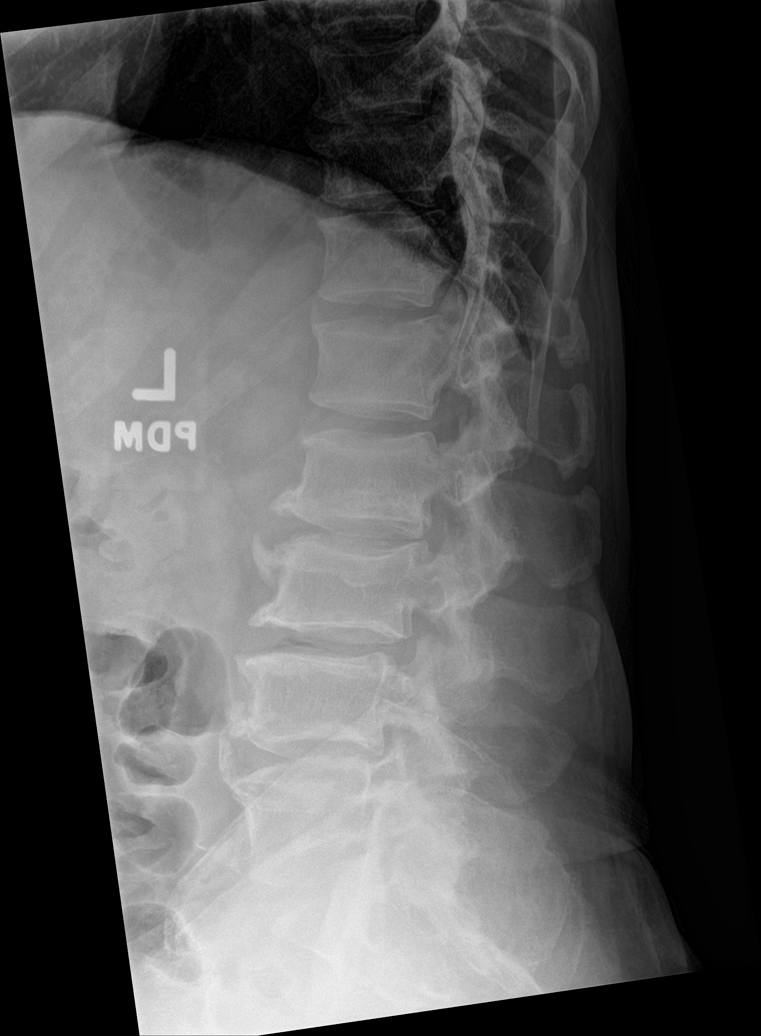

[l-spine spot]
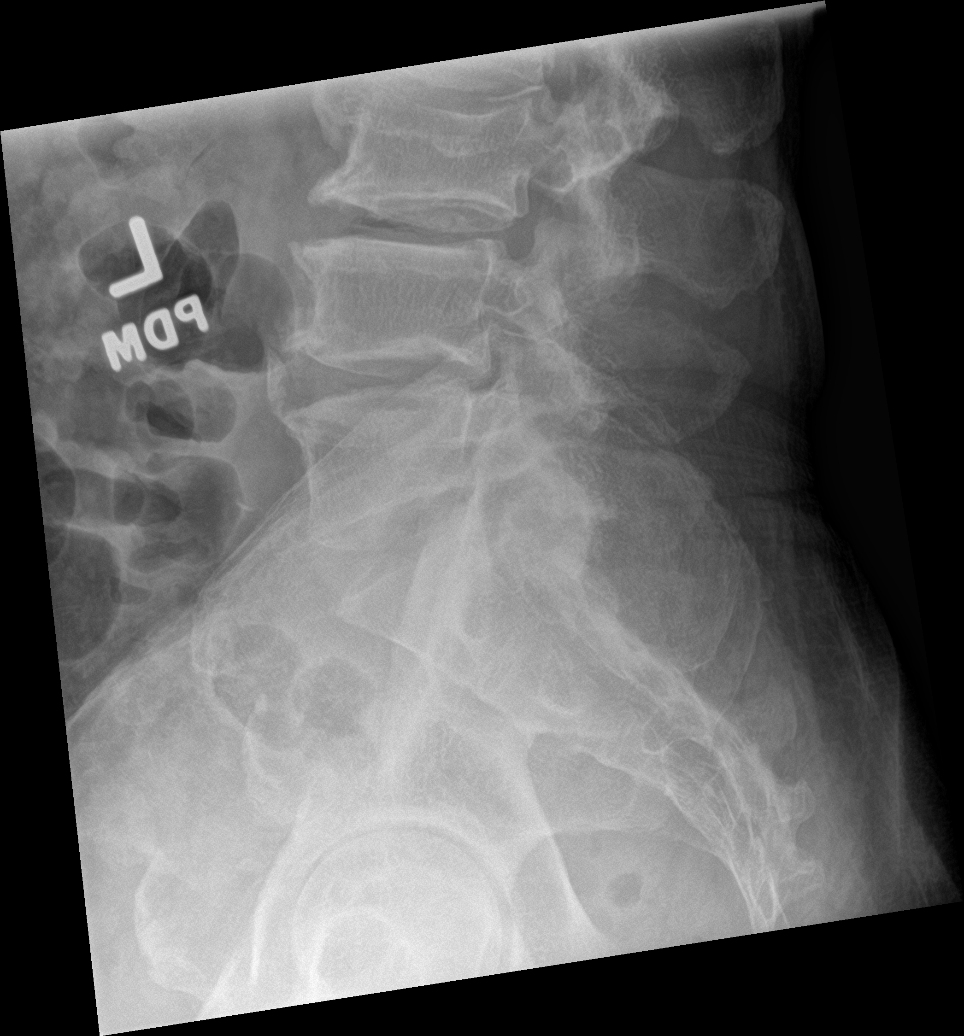

[5 of 5 positions shown; findings below may reference images not displayed]

FINDINGS: Normal lumbar lordosis. No acute fracture or listhesis of the lumbar
spine. Vertebral body height has been preserved. There is
intervertebral disc space narrowing and endplate remodeling of
L2-L5, progressive since prior examination, in keeping with moderate
degenerative disc disease. Oblique views demonstrate no evidence of
pars defect. The paraspinal soft tissues are unremarkable.
IMPRESSION: Moderate degenerative disc disease of the lumbar spine, slightly
progressive since prior examination.

## 2022-02-03 NOTE — Progress Notes (Signed)
Office Visit Note   Patient: [name] [name]           Date of Birth: [birth year]           MRN: [id number] Visit Date: 02/03/2022              Requested by: Dorothyann Peng, NP [place],  [place] [address] PCP: Dorothyann Peng, NP   Assessment & Plan: Visit Diagnoses:  1. Strain of quadriceps, right, initial encounter     Plan: Impression is right partial quadriceps tear.  Today, we discussed discontinuing his playmaker brace.  We will continue with formal physical therapy as well as a home exercise program where he will continue to work on range of motion and quadricep strengthening.  He will follow-up with [place] in 4 weeks time for recheck.  Call with concerns or questions in the meantime.  Follow-Up Instructions: Return in about 4 weeks (around 03/03/2022).   Orders:  No orders of the defined types were placed in this encounter.  No orders of the defined types were placed in this encounter.     Procedures: No procedures performed   Clinical Data: No additional findings.   Subjective: Chief Complaint  Patient presents with   Right Knee - Follow-up    Partial quadriceps tear    HPI patient is a pleasant 75 year old gentleman who comes in today 11 weeks status post right partial quadriceps tear 11/19/2021.  He has been doing well.  He is in minimal pain.  He has been compliant wearing his playmaker brace locked from 0 to 120 degrees.  He has been compliant with formal physical therapy as well as a home exercise program.  Overall, doing well and making progress.     Objective: Vital Signs: There were no vitals taken for this visit.    Ortho Exam right knee exam reveals range of motion from 0 to 110 degrees.  4 out of 5 strength with resisted straight leg raise.  He is neurovascular intact distally.  Specialty Comments:  No specialty comments available.  Imaging: No new imaging   PMFS History: Patient Active Problem List   Diagnosis Date  Noted   Strain of quadriceps, right, initial encounter 12/10/2021   Acute pain of right knee 11/24/2021   Drug-induced myopathy 01/08/2020   Iron deficiency anemia 04/03/2019   Anemia of chronic renal failure, stage 4 (severe) ([place]) 04/03/2019   Late effect of cerebrovascular accident (CVA) 12/07/2017   Hypoglycemia    Uncontrolled hypertension    Hypokalemia    Acute ischemic left MCA stroke ([place]) 07/12/2017   Diabetes mellitus type 2 in obese [facility])    Stage 3 chronic kidney disease ([place])    Left middle cerebral artery stroke ([place]) 46/50/3546   Acute embolic stroke ([place])    Aphasia 07/08/2017   Gout 07/21/2011   Prostatitis, chronic 07/14/2010   Hyperlipidemia 06/18/2007   Hypertensive kidney disease 01/02/2007   Past Medical History:  Diagnosis Date   Anemia    Cataract    DIABETES MELLITUS, TYPE II 01/02/2007   HYPERLIPIDEMIA 06/18/2007   HYPERTENSION 01/02/2007   RENAL INSUFFICIENCY 02/05/2008   Stroke ([place]) 2019    Family History  Problem Relation Age of Onset   Hypertension Mother    Diabetes Mother    Stroke Father    Stroke Maternal Grandmother    Colon cancer Neg Hx    Esophageal cancer Neg Hx    Rectal cancer Neg Hx    Stomach  cancer Neg Hx     Past Surgical History:  Procedure Laterality Date   LOOP RECORDER INSERTION N/A 07/11/2017   Procedure: LOOP RECORDER INSERTION;  Surgeon: Thompson Grayer, MD;  Location: [facility];  Service: Cardiovascular;  Laterality: N/A;   SHOULDER SURGERY     left   TEE WITHOUT CARDIOVERSION N/A 07/11/2017   Procedure: TRANSESOPHAGEAL ECHOCARDIOGRAM (TEE);  Surgeon: Dorothy Spark, MD;  Location: [facility] ENDOSCOPY;  Service: Cardiovascular;  Laterality: N/A;   TRANSURETHRAL RESECTION OF PROSTATE     history of retention/hematuria    Social History   Occupational History   Not on file  Tobacco Use   Smoking status: Never   Smokeless tobacco: Never  Vaping Use   Vaping Use: Never used  Substance and Sexual Activity    Alcohol use: No   Drug use: No   Sexual activity: Not on file

## 2022-02-03 NOTE — Therapy (Signed)
OUTPATIENT PHYSICAL THERAPY TREATMENT  Patient Name: [name] [name] MRN: [id number] DOB:10-04-1945, 76 y.o., male Today's Date: 02/03/2022  PCP: [name] Peng, NP REFERRING PROVIDER: Aundra Dubin, PA-C    END OF SESSION:   PT End of Session - 02/03/22 0802     Visit Number 6    Number of Visits 16    PT Start Time 0801    PT Stop Time 3474    PT Time Calculation (min) 40 min    Activity Tolerance Patient tolerated treatment well;No increased pain    Behavior During Therapy [facility] for tasks assessed/performed              Past Medical History:  Diagnosis Date   Anemia    Cataract    DIABETES MELLITUS, TYPE II 01/02/2007   HYPERLIPIDEMIA 06/18/2007   HYPERTENSION 01/02/2007   RENAL INSUFFICIENCY 02/05/2008   Stroke ([place]) 2019   Past Surgical History:  Procedure Laterality Date   LOOP RECORDER INSERTION N/A 07/11/2017   Procedure: LOOP RECORDER INSERTION;  Surgeon: Thompson Grayer, MD;  Location: [facility];  Service: Cardiovascular;  Laterality: N/A;   SHOULDER SURGERY     left   TEE WITHOUT CARDIOVERSION N/A 07/11/2017   Procedure: TRANSESOPHAGEAL ECHOCARDIOGRAM (TEE);  Surgeon: Dorothy Spark, MD;  Location: [facility] ENDOSCOPY;  Service: Cardiovascular;  Laterality: N/A;   TRANSURETHRAL RESECTION OF PROSTATE     history of retention/hematuria    Patient Active Problem List   Diagnosis Date Noted   Strain of quadriceps, right, initial encounter 12/10/2021   Acute pain of right knee 11/24/2021   Drug-induced myopathy 01/08/2020   Iron deficiency anemia 04/03/2019   Anemia of chronic renal failure, stage 4 (severe) ([place]) 04/03/2019   Late effect of cerebrovascular accident (CVA) 12/07/2017   Hypoglycemia    Uncontrolled hypertension    Hypokalemia    Acute ischemic left MCA stroke ([place]) 07/12/2017   Diabetes mellitus type 2 in obese ([place])    Stage 3 chronic kidney disease ([place])    Left middle cerebral artery stroke ([place]) 25/95/6387   Acute embolic stroke  ([place])    Aphasia 07/08/2017   Gout 07/21/2011   Prostatitis, chronic 07/14/2010   Hyperlipidemia 06/18/2007   Hypertensive kidney disease 01/02/2007    REFERRING DIAG: F64.332R (ICD-10-CM) - Strain of quadriceps, right, initial encounter   THERAPY DIAG:  Difficulty in walking, not elsewhere classified  Muscle weakness (generalized)  Stiffness of right knee, not elsewhere classified  Acute pain of right knee  Rationale for Evaluation and Treatment Rehabilitation  PERTINENT HISTORY: Type 2 DM, renal insufficiency, previous stroke, HTN, HLD, L shoulder surgery  PRECAUTIONS: Knee brace limited to 90 degrees flexion.  No flexion limitations noted on the referral although strength will be the emphasis  SUBJECTIVE: [name] [name] reports some L groin discomfort.  It is not present today and sounds like hip flexors vs joint given the difficulty with hip flexion vs WB.  PAIN:  Are you having pain? Yes: NPRS scale: 0-3/10 Pain location: Inferior patellar pole and suprapatellar Pain description: Ache Aggravating factors: Prolonged sitting Relieving factors: Pain medication   OBJECTIVE: (objective measures completed at initial evaluation unless otherwise dated)  OBJECTIVE:    DIAGNOSTIC FINDINGS: High-grade quadriceps tendon tear primarily involving the vastus intermedius component with 1.6 cm retraction. There are few intact anterior, lateral, and medial fibers.   Nondisplaced degenerative tearing of the posterior horn and body of the medial meniscus with substance loss.   Intrasubstance degenerative signal in the  lateral meniscus with extension to the free edge in the lateral meniscal body, indicating a possible nondisplaced degenerative tear.   Tricompartment osteoarthritis, worst in the medial and patellofemoral compartments.   Large joint effusion.   PATIENT SURVEYS:  01/27/2022 FOTO 62 (Goal met) 01/05/2022 FOTO 47 (Goal 62 in 13 visits)   COGNITION:           Overall  cognitive status: Within functional limits for tasks assessed                          SENSATION: No complaints of peripheral pain or paresthesias   EDEMA:  Noted but not measured (mild)     LOWER EXTREMITY ROM:   Active ROM Right eval Left eval Right 01/27/2022  Hip flexion       Hip extension       Hip abduction       Hip adduction       Hip internal rotation       Hip external rotation       Knee flexion 113 130 118  Knee extension 1 0 0  Ankle dorsiflexion       Ankle plantarflexion       Ankle inversion       Ankle eversion        (Blank rows = not tested)   LOWER EXTREMITY MMT:   Thigh girth assessed 15 cm proximal to the superior patellar pole: R 50 cm and L 51.5 cm   Right eval Left eval Thigh girth R vs L assessed 15 cm proximal to the superior patellar pole  R 52.5 cm and L 53 cm  Hip flexion       Hip extension       Hip abduction       Hip adduction       Hip internal rotation       Hip external rotation       Knee flexion       Knee extension       Ankle dorsiflexion       Ankle plantarflexion       Ankle inversion       Ankle eversion        (Blank rows = not tested)   GAIT: Distance walked: In the office Assistive device utilized:  Knee brace locked at 0-90 degrees Level of assistance: Complete Independence Comments: No mention of AROM limitations in eval.  Focus is on strength and not recommended to push AROM with a tear.       TODAY'S TREATMENT: 02/03/2022: Functional Activities for sit to stand and stairs: Step-down off 4 inch step 2 sets of 10X each slow eccentrics Leg Press double leg 87# (up next time) 15X slow eccentrics and R leg only 37# 5X slow eccentrics and 43# 5X slow eccentrics (better with 37#) Sit to stand with UE use and slow eccentrics 2 sets of 5  Therapeutic Exercises: Hip hike in doorway 5X each side 2 seconds (attempted but weak hip abductors prevented proper form)  Neuromuscular re-education: Tandem balance eyes open  and single leg stance eyes open    01/27/2022:   Functional Activities for sit to stand and stairs: Step-down off 4 inch step 2 sets of 10X each slow eccentrics Leg Press double leg 87# 15X slow eccentrics and R leg only 37# 10X slow eccentrics Sit to stand with UE use and slow eccentrics 2 sets of 5  Neuromuscular re-education:  Tandem balance eyes open and single leg stance eyes open   01/20/2022: Quadriceps sets 3 sets of 10 for 5 seconds Seated straight leg raises 5X each side 3 seconds (slow eccentrics and fight hard to avoid quadriceps lag)-only able to raise leg about 1-2 inches, better than last visit   Functional Activities for sit to stand and stairs: Step-up and over and step-down off 2 and 4 inch step 10X each slow eccentrics Leg Press double leg 81# 10X slow eccentrics and R leg only 25# 10X slow eccentrics   Neuromuscular re-education: Tandem balance eyes open and single leg stance eyes open     PATIENT EDUCATION:  Education details: See above Person educated: Patient and Spouse Education method: Explanation, Demonstration, Tactile cues, Verbal cues, and Handouts Education comprehension: verbalized understanding, returned demonstration, verbal cues required, tactile cues required, and needs further education     HOME EXERCISE PROGRAM:  Access Code: [email or web address] URL: [email or web address] Date: 01/27/2022 Prepared by: Vista Mink  Exercises - Supine Quadricep Sets  - 3-5 x daily - 7 x weekly - 2-3 sets - 10 reps - 5 second hold - Small Range Straight Leg Raise  - 3-5 x daily - 1 x weekly - 1 sets - 5 reps - 3 seconds hold - Tandem Stance  - 2-3 x daily - 7 x weekly - 1 sets - 5 reps - 20 second hold - Sit to Stand with Armchair  - 2 x daily - 7 x weekly - 1 sets - 5 reps   ASSESSMENT:   CLINICAL IMPRESSION: Larry's PT focus remains appropriate quadriceps and lower extremity strength progressions within an appropriate range (comfortable range) to  improve strength and stability, improve function and decrease pain.  We tried to add some hip abductors strength activities to address groin pain and noted hip abductors weakness.  Getting back on his balance activities should help with this (he has not been doing his balance activities at home).  Follow-up with continued strength work to meet LTGs.     OBJECTIVE IMPAIRMENTS Abnormal gait, decreased activity tolerance, decreased endurance, difficulty walking, decreased ROM, decreased strength, increased edema, impaired perceived functional ability, impaired flexibility, and pain.    ACTIVITY LIMITATIONS carrying, lifting, bending, standing, squatting, stairs, and locomotion level   PARTICIPATION LIMITATIONS: driving, community activity, and going to the gym   PERSONAL FACTORS Type 2 DM, renal insufficiency, previous stroke, HTN, HLD, previous L shoulder surgery are also affecting patient's functional outcome.    REHAB POTENTIAL: Good   CLINICAL DECISION MAKING: Stable/uncomplicated   EVALUATION COMPLEXITY: Low     GOALS: Goals reviewed with patient? Yes   SHORT TERM GOALS: Target date: 02/02/2022  Improve R quadriceps strength as assessed by his ability to complete a set of 5 seated straight leg raises without quadriceps lag Baseline: Quadriceps lag evident on rep 1 Goal status: On Going 01/27/2022   2.  [name] [name] will be independent with his day 1 HEP Baseline: Started 01/05/2022 Goal status: Met 01/27/2022   3.  [name] [name] will not need pain medication for his R knee Baseline: Prescription pain meds PRN Goal status: 1X this week 01/27/2022     LONG TERM GOALS: Target date: 03/02/2022    Improve FOTO to 62 in 13 visits Baseline: 47 at visit 1 Goal status: Met 01/27/2022   2.  Improve R thigh/knee pain to consistently 0-2/10 on the Numeric Pain Rating Scale Baseline: 0-5/10 Goal status: On Going 02/03/2022   3.  Improve R knee AROM for flexion  to pain-free 130 degrees Baseline: 113 degrees  with pain at end range Goal status: On Going (118) 01/27/2022   4.  Improve R quadriceps strength as assessed by improved thigh girth assessed 15 cm proximal to the superior patellar pole Baseline: 50 cm Goal status: On Going, improved 01/27/2022   5.  [name] [name] will be independent with his long-term HEP at [place] Baseline: Started 01/05/2022 Goal status: On Going 02/03/2022   PLAN: PT FREQUENCY: 2x/week   PT DURATION: 8 weeks   PLANNED INTERVENTIONS: Therapeutic exercises, Therapeutic activity, Neuromuscular re-education, Balance training, Gait training, Patient/Family education, Stair training, Electrical stimulation, Cryotherapy, Vasopneumatic device, and BFR   PLAN FOR NEXT SESSION: Progress functional quadriceps strength, balance work, limited range step downs/up and over with slow eccentrics, limited range leg press with slow eccentrics.  Try seated SLR again.  [place] to end as needed.      Farley Ly, PT, MPT 02/03/2022, 8:46 AM

## 2022-02-09 ENCOUNTER — Ambulatory Visit (INDEPENDENT_AMBULATORY_CARE_PROVIDER_SITE_OTHER): Payer: Medicare Other | Admitting: Rehabilitative and Restorative Service Providers"

## 2022-02-09 ENCOUNTER — Encounter: Payer: Self-pay | Admitting: Rehabilitative and Restorative Service Providers"

## 2022-02-09 DIAGNOSIS — M25661 Stiffness of right knee, not elsewhere classified: Secondary | ICD-10-CM

## 2022-02-09 DIAGNOSIS — M25561 Pain in right knee: Secondary | ICD-10-CM

## 2022-02-09 DIAGNOSIS — R262 Difficulty in walking, not elsewhere classified: Secondary | ICD-10-CM | POA: Diagnosis not present

## 2022-02-09 DIAGNOSIS — M6281 Muscle weakness (generalized): Secondary | ICD-10-CM

## 2022-02-09 NOTE — Therapy (Signed)
OUTPATIENT PHYSICAL THERAPY TREATMENT  Patient Name: [name] [name] MRN: [id number] DOB:1945/11/06, 76 y.o., male Today's Date: 02/09/2022  PCP: Dorothyann Peng, NP REFERRING PROVIDER: Aundra Dubin, PA-C    END OF SESSION:   PT End of Session - 02/09/22 1517     Visit Number 7    Number of Visits 16    PT Start Time 0165    PT Stop Time 1601    PT Time Calculation (min) 44 min    Activity Tolerance Patient tolerated treatment well;No increased pain    Behavior During Therapy [facility] for tasks assessed/performed             Past Medical History:  Diagnosis Date   Anemia    Cataract    DIABETES MELLITUS, TYPE II 01/02/2007   HYPERLIPIDEMIA 06/18/2007   HYPERTENSION 01/02/2007   RENAL INSUFFICIENCY 02/05/2008   Stroke ([place]) 2019   Past Surgical History:  Procedure Laterality Date   LOOP RECORDER INSERTION N/A 07/11/2017   Procedure: LOOP RECORDER INSERTION;  Surgeon: [place] Grayer, MD;  Location: [facility];  Service: Cardiovascular;  Laterality: N/A;   SHOULDER SURGERY     left   TEE WITHOUT CARDIOVERSION N/A 07/11/2017   Procedure: TRANSESOPHAGEAL ECHOCARDIOGRAM (TEE);  Surgeon: Dorothy Spark, MD;  Location: [facility];  Service: Cardiovascular;  Laterality: N/A;   TRANSURETHRAL RESECTION OF PROSTATE     history of retention/hematuria    Patient Active Problem List   Diagnosis Date Noted   Strain of quadriceps, right, initial encounter 12/10/2021   Acute pain of right knee 11/24/2021   Drug-induced myopathy 01/08/2020   Iron deficiency anemia 04/03/2019   Anemia of chronic renal failure, stage 4 (severe) ([place]) 04/03/2019   Late effect of cerebrovascular accident (CVA) 12/07/2017   Hypoglycemia    Uncontrolled hypertension    Hypokalemia    Acute ischemic left MCA stroke ([place]) 07/12/2017   Diabetes mellitus type 2 in obese ([place])    Stage 3 chronic kidney disease ([place])    Left middle cerebral artery stroke ([place]) 53/74/8270   Acute embolic stroke  ([place])    Aphasia 07/08/2017   Gout 07/21/2011   Prostatitis, chronic 07/14/2010   Hyperlipidemia 06/18/2007   Hypertensive kidney disease 01/02/2007    REFERRING DIAG: B86.754G (ICD-10-CM) - Strain of quadriceps, right, initial encounter   THERAPY DIAG:  Difficulty in walking, not elsewhere classified  Muscle weakness (generalized)  Stiffness of right knee, not elsewhere classified  Acute pain of right knee  Rationale for Evaluation and Treatment Rehabilitation  PERTINENT HISTORY: Type 2 DM, renal insufficiency, previous stroke, HTN, HLD, L shoulder surgery  PRECAUTIONS: No longer in knee brace as of 02/09/2022  SUBJECTIVE: [name] reports some L groin discomfort, although this has improved.  He also notes some low back pain over the past week, likely due to changes in his gait.  PAIN:  Are you having pain? Yes: NPRS scale: 0-5/10 Pain location: Inferior patellar pole and suprapatellar, some LBP and L groin Pain description: Ache Aggravating factors: Prolonged sitting Relieving factors: Pain medication   OBJECTIVE: (objective measures completed at initial evaluation unless otherwise dated)  OBJECTIVE:    DIAGNOSTIC FINDINGS: High-grade quadriceps tendon tear primarily involving the vastus intermedius component with 1.6 cm retraction. There are few intact anterior, lateral, and medial fibers.   Nondisplaced degenerative tearing of the posterior horn and body of the medial meniscus with substance loss.   Intrasubstance degenerative signal in the lateral meniscus with extension to the free  edge in the lateral meniscal body, indicating a possible nondisplaced degenerative tear.   Tricompartment osteoarthritis, worst in the medial and patellofemoral compartments.   Large joint effusion.   PATIENT SURVEYS:  01/27/2022 FOTO 62 (Goal met) 01/05/2022 FOTO 47 (Goal 62 in 13 visits)   COGNITION:           Overall cognitive status: Within functional limits for tasks assessed                           SENSATION: No complaints of peripheral pain or paresthesias   EDEMA:  Noted but not measured (mild)     LOWER EXTREMITY ROM:   Active ROM Right eval Left eval Right 01/27/2022  Hip flexion       Hip extension       Hip abduction       Hip adduction       Hip internal rotation       Hip external rotation       Knee flexion 113 130 118  Knee extension 1 0 0  Ankle dorsiflexion       Ankle plantarflexion       Ankle inversion       Ankle eversion        (Blank rows = not tested)   LOWER EXTREMITY MMT:   Thigh girth assessed 15 cm proximal to the superior patellar pole: R 50 cm and L 51.5 cm   Right eval Left eval Thigh girth R vs L assessed 15 cm proximal to the superior patellar pole  R 52.5 cm and L 53 cm  Hip flexion       Hip extension       Hip abduction       Hip adduction       Hip internal rotation       Hip external rotation       Knee flexion       Knee extension       Ankle dorsiflexion       Ankle plantarflexion       Ankle inversion       Ankle eversion        (Blank rows = not tested)   GAIT: Distance walked: In the office Assistive device utilized:  Knee brace locked at 0-90 degrees Level of assistance: Complete Independence Comments: No mention of AROM limitations in eval.  Focus is on strength and not recommended to push AROM with a tear.       TODAY'S TREATMENT: 02/09/2022: Functional Activities for sit to stand and stairs: Step-down off 4 inch step 2 sets of 10X each slow eccentrics, avoid hyperextension, cues for posture (avoid trunk flexion) Leg Press R leg only 43#  2 sets of 10X slow eccentrics  Sit to stand with UE use and slow eccentrics 2 sets of 5 (add standing lumbar extension at the top)  Therapeutic Exercises: Side lie hip abduction 10X B 3 seconds (needs lots of help from PT to avoid rolling back) Side lie clams 10X B 3 seconds Lumbar extension AROM 10X 3 seconds  Neuromuscular re-education: Tandem  balance eyes open and dynamic tandem balance 4X each 20+ seconds   02/03/2022: Functional Activities for sit to stand and stairs: Step-down off 4 inch step 2 sets of 10X each slow eccentrics Leg Press double leg 87# (up next time) 15X slow eccentrics and R leg only 37# 5X slow eccentrics and 43# 5X slow eccentrics (  better with 37#) Sit to stand with UE use and slow eccentrics 2 sets of 5  Therapeutic Exercises: Hip hike in doorway 5X each side 2 seconds (attempted but weak hip abductors prevented proper form)  Neuromuscular re-education: Tandem balance eyes open and single leg stance eyes open   01/27/2022:   Functional Activities for sit to stand and stairs: Step-down off 4 inch step 2 sets of 10X each slow eccentrics Leg Press double leg 87# 15X slow eccentrics and R leg only 37# 10X slow eccentrics Sit to stand with UE use and slow eccentrics 2 sets of 5  Neuromuscular re-education: Tandem balance eyes open and single leg stance eyes open     PATIENT EDUCATION:  Education details: See above Person educated: Patient and Spouse Education method: Explanation, Demonstration, Tactile cues, Verbal cues, and Handouts Education comprehension: verbalized understanding, returned demonstration, verbal cues required, tactile cues required, and needs further education     HOME EXERCISE PROGRAM: Access Code: [email or web address] URL: [email or web address] Date: 02/09/2022 Prepared by: Vista Mink  Exercises - Supine Quadricep Sets  - 3-5 x daily - 7 x weekly - 2-3 sets - 10 reps - 5 second hold - Small Range Straight Leg Raise  - 3-5 x daily - 1 x weekly - 1 sets - 5 reps - 3 seconds hold - Tandem Stance  - 2-3 x daily - 7 x weekly - 1 sets - 5 reps - 20 second hold - Sit to Stand with Armchair  - 2 x daily - 7 x weekly - 1 sets - 5 reps - Standing Lumbar Extension at Wall - Forearms  - 5 x daily - 7 x weekly - 1 sets - 5 reps - 3 seconds hold - Clamshell  - 2 x daily - 7 x weekly  - 2 sets - 10 reps - 3 seconds hold   ASSESSMENT:   CLINICAL IMPRESSION: The focus of Larry's physical therapy remains appropriate quadriceps and lower extremity strength progressions to improve strength and stability, improve function and decrease pain.  He is now released from his leg brace and knee flexion may be appropriately progressed while still avoiding aggressively stretching a quadriceps tear.  Hip abductors strength and trunk extension activities were added to address deficits that are affecting gait and causing pain.  Continue current POC.     OBJECTIVE IMPAIRMENTS Abnormal gait, decreased activity tolerance, decreased endurance, difficulty walking, decreased ROM, decreased strength, increased edema, impaired perceived functional ability, impaired flexibility, and pain.    ACTIVITY LIMITATIONS carrying, lifting, bending, standing, squatting, stairs, and locomotion level   PARTICIPATION LIMITATIONS: driving, community activity, and going to the gym   PERSONAL FACTORS Type 2 DM, renal insufficiency, previous stroke, HTN, HLD, previous L shoulder surgery are also affecting patient's functional outcome.    REHAB POTENTIAL: Good   CLINICAL DECISION MAKING: Stable/uncomplicated   EVALUATION COMPLEXITY: Low     GOALS: Goals reviewed with patient? Yes   SHORT TERM GOALS: Target date: 02/02/2022  Improve R quadriceps strength as assessed by his ability to complete a set of 5 seated straight leg raises without quadriceps lag Baseline: Quadriceps lag evident on rep 1 Goal status: On Going 01/27/2022   2.  [name] will be independent with his day 1 HEP Baseline: Started 01/05/2022 Goal status: Met 01/27/2022   3.  [name] will not need pain medication for his R knee Baseline: Prescription pain meds PRN Goal status: 1X this week 02/09/2022     LONG TERM GOALS: Target date: 03/02/2022  Improve FOTO to 62 in 13 visits Baseline: 47 at visit 1 Goal status: Met 01/27/2022   2.  Improve R  thigh/knee pain to consistently 0-2/10 on the Numeric Pain Rating Scale Baseline: 0-5/10 Goal status: On Going 02/09/2022   3.  Improve R knee AROM for flexion to pain-free 130 degrees Baseline: 113 degrees with pain at end range Goal status: On Going (118) 01/27/2022   4.  Improve R quadriceps strength as assessed by improved thigh girth assessed 15 cm proximal to the superior patellar pole Baseline: 50 cm Goal status: On Going, improved 01/27/2022   5.  [name] will be independent with his long-term HEP at [place] Baseline: Started 01/05/2022 Goal status: On Going 02/09/2022   PLAN: PT FREQUENCY: 2x/week   PT DURATION: 8 weeks   PLANNED INTERVENTIONS: Therapeutic exercises, Therapeutic activity, Neuromuscular re-education, Balance training, Gait training, Patient/Family education, Stair training, Electrical stimulation, Cryotherapy, Vasopneumatic device, and BFR   PLAN FOR NEXT SESSION: Progress functional quadriceps strength, balance work, limited range step downs/up and over with slow eccentrics, limited range leg press with slow eccentrics.  Try seated SLR again.  Vaso to end as needed.      Farley Ly, PT, MPT 02/09/2022, 5:12 PM

## 2022-02-11 ENCOUNTER — Ambulatory Visit: Payer: Medicare Other | Admitting: Rehabilitative and Restorative Service Providers"

## 2022-02-11 ENCOUNTER — Telehealth: Payer: Self-pay | Admitting: Pharmacist

## 2022-02-11 ENCOUNTER — Encounter: Payer: Self-pay | Admitting: Rehabilitative and Restorative Service Providers"

## 2022-02-11 DIAGNOSIS — M25661 Stiffness of right knee, not elsewhere classified: Secondary | ICD-10-CM

## 2022-02-11 DIAGNOSIS — R262 Difficulty in walking, not elsewhere classified: Secondary | ICD-10-CM

## 2022-02-11 DIAGNOSIS — M6281 Muscle weakness (generalized): Secondary | ICD-10-CM

## 2022-02-11 DIAGNOSIS — M25561 Pain in right knee: Secondary | ICD-10-CM

## 2022-02-11 NOTE — Chronic Care Management (AMB) (Signed)
    Chronic Care Management Pharmacy Assistant   Name: [name] [name]  MRN: [id number] DOB: 1945-11-18  02/14/2022 APPOINTMENT [name] wife was reminded to have all medications, supplements and any blood glucose and blood pressure readings available for review with Jeni Salles, Pharm. D, at his telephone visit on 02/14/2022 at 10:30.  Care Gaps: AWV - scheduled 08/17/2022 Last BP - 170/90 on 12/29/2021 Last A1C - 7.1 on 11/16/2021 Shingrix - never done Covid booster - overdue Flu - due  Star Rating Drug: Nateglinide '60mg'$  - last filled 12/13/2021 90 DS at [facility] Valsartan '160mg'$  - last filled 01/14/2022 90 DS at [facility]  Any gaps in medications fill history? No  [name]  Catering manager [phone]

## 2022-02-11 NOTE — Therapy (Signed)
OUTPATIENT PHYSICAL THERAPY TREATMENT  Patient Name: [name] [name] MRN: [id number] DOB:Feb 22, 1946, 76 y.o., male Today's Date: 02/11/2022  PCP: Dorothyann Peng, NP REFERRING PROVIDER: Aundra Dubin, PA-C    END OF SESSION:   PT End of Session - 02/11/22 1523     Visit Number 8    Number of Visits 16    PT Start Time 1779    PT Stop Time 3903    PT Time Calculation (min) 40 min    Activity Tolerance Patient tolerated treatment well;No increased pain    Behavior During Therapy [facility] for tasks assessed/performed              Past Medical History:  Diagnosis Date   Anemia    Cataract    DIABETES MELLITUS, TYPE II 01/02/2007   HYPERLIPIDEMIA 06/18/2007   HYPERTENSION 01/02/2007   RENAL INSUFFICIENCY 02/05/2008   Stroke ([place]) 2019   Past Surgical History:  Procedure Laterality Date   LOOP RECORDER INSERTION N/A 07/11/2017   Procedure: LOOP RECORDER INSERTION;  Surgeon: [place] Grayer, MD;  Location: [place];  Service: Cardiovascular;  Laterality: N/A;   SHOULDER SURGERY     left   TEE WITHOUT CARDIOVERSION N/A 07/11/2017   Procedure: TRANSESOPHAGEAL ECHOCARDIOGRAM (TEE);  Surgeon: Dorothy Spark, MD;  Location: [facility] ENDOSCOPY;  Service: Cardiovascular;  Laterality: N/A;   TRANSURETHRAL RESECTION OF PROSTATE     history of retention/hematuria    Patient Active Problem List   Diagnosis Date Noted   Strain of quadriceps, right, initial encounter 12/10/2021   Acute pain of right knee 11/24/2021   Drug-induced myopathy 01/08/2020   Iron deficiency anemia 04/03/2019   Anemia of chronic renal failure, stage 4 (severe) ([place]) 04/03/2019   Late effect of cerebrovascular accident (CVA) 12/07/2017   Hypoglycemia    Uncontrolled hypertension    Hypokalemia    Acute ischemic left MCA stroke ([place]) 07/12/2017   Diabetes mellitus type 2 in obese ([place])    Stage 3 chronic kidney disease ([place])    Left middle cerebral artery stroke ([place]) 00/92/3300   Acute embolic stroke  ([place])    Aphasia 07/08/2017   Gout 07/21/2011   Prostatitis, chronic 07/14/2010   Hyperlipidemia 06/18/2007   Hypertensive kidney disease 01/02/2007    REFERRING DIAG: T62.263F (ICD-10-CM) - Strain of quadriceps, right, initial encounter   THERAPY DIAG:  Difficulty in walking, not elsewhere classified  Muscle weakness (generalized)  Stiffness of right knee, not elsewhere classified  Acute pain of right knee  Rationale for Evaluation and Treatment Rehabilitation  PERTINENT HISTORY: Type 2 DM, renal insufficiency, previous stroke, HTN, HLD, L shoulder surgery  PRECAUTIONS: No longer in knee brace as of 02/09/2022  SUBJECTIVE: [name] [name] reports his L groin is improving, although there is still discomfort.  He also notes some low back pain over the past week, likely due to changes in his gait.  PAIN:  Are you having pain? Yes: NPRS scale: 0-5/10 Pain location: Inferior patellar pole and suprapatellar, some LBP and L groin Pain description: Ache Aggravating factors: Prolonged sitting Relieving factors: Pain medication   OBJECTIVE: (objective measures completed at initial evaluation unless otherwise dated)  OBJECTIVE:    DIAGNOSTIC FINDINGS: High-grade quadriceps tendon tear primarily involving the vastus intermedius component with 1.6 cm retraction. There are few intact anterior, lateral, and medial fibers.   Nondisplaced degenerative tearing of the posterior horn and body of the medial meniscus with substance loss.   Intrasubstance degenerative signal in the lateral meniscus with extension  to the free edge in the lateral meniscal body, indicating a possible nondisplaced degenerative tear.   Tricompartment osteoarthritis, worst in the medial and patellofemoral compartments.   Large joint effusion.   PATIENT SURVEYS:  01/27/2022 FOTO 62 (Goal met) 01/05/2022 FOTO 47 (Goal 62 in 13 visits)   COGNITION:           Overall cognitive status: Within functional limits for tasks  assessed                          SENSATION: No complaints of peripheral pain or paresthesias   EDEMA:  Noted but not measured (mild)     LOWER EXTREMITY ROM:   Active ROM Right eval Left eval Right 01/27/2022  Hip flexion       Hip extension       Hip abduction       Hip adduction       Hip internal rotation       Hip external rotation       Knee flexion 113 130 118  Knee extension 1 0 0  Ankle dorsiflexion       Ankle plantarflexion       Ankle inversion       Ankle eversion        (Blank rows = not tested)   LOWER EXTREMITY MMT:   Thigh girth assessed 15 cm proximal to the superior patellar pole: R 50 cm and L 51.5 cm   Right eval Left eval Thigh girth R vs L assessed 15 cm proximal to the superior patellar pole  R 52.5 cm and L 53 cm  Hip flexion       Hip extension       Hip abduction       Hip adduction       Hip internal rotation       Hip external rotation       Knee flexion       Knee extension       Ankle dorsiflexion       Ankle plantarflexion       Ankle inversion       Ankle eversion        (Blank rows = not tested)   GAIT: Distance walked: In the office Assistive device utilized:  Knee brace locked at 0-90 degrees Level of assistance: Complete Independence Comments: No mention of AROM limitations in eval.  Focus is on strength and not recommended to push AROM with a tear.       TODAY'S TREATMENT: 02/11/2022: Functional Activities for sit to stand and stairs: Step-down off 4 inch step 3 sets of 10X each slow eccentrics, avoid hyperextension, cues for posture (avoid trunk flexion) Leg Press R leg only 43#  2 sets of 10X slow eccentrics  Therapeutic Exercises: Side lie hip abduction 10X B 3 seconds (still needs lots of help from PT to avoid rolling back) Side lie clams with Blue theraband resistance 10X B 3 seconds 2 sets Lumbar extension AROM 10X 3 seconds Seated SLR 5X B (still quad lag on R)  Neuromuscular re-education: Tandem balance  eyes open and dynamic tandem balance 4X each 20+ seconds   02/09/2022: Functional Activities for sit to stand and stairs: Step-down off 4 inch step 2 sets of 10X each slow eccentrics, avoid hyperextension, cues for posture (avoid trunk flexion) Leg Press R leg only 43#  2 sets of 10X slow eccentrics  Sit to stand with  UE use and slow eccentrics 2 sets of 5 (add standing lumbar extension at the top)  Therapeutic Exercises: Side lie hip abduction 10X B 3 seconds (needs lots of help from PT to avoid rolling back) Side lie clams 10X B 3 seconds Lumbar extension AROM 10X 3 seconds  Neuromuscular re-education: Tandem balance eyes open and dynamic tandem balance 4X each 20+ seconds   02/03/2022: Functional Activities for sit to stand and stairs: Step-down off 4 inch step 2 sets of 10X each slow eccentrics Leg Press double leg 87# (up next time) 15X slow eccentrics and R leg only 37# 5X slow eccentrics and 43# 5X slow eccentrics (better with 37#) Sit to stand with UE use and slow eccentrics 2 sets of 5  Therapeutic Exercises: Hip hike in doorway 5X each side 2 seconds (attempted but weak hip abductors prevented proper form)  Neuromuscular re-education: Tandem balance eyes open and single leg stance eyes open    PATIENT EDUCATION:  Education details: See above Person educated: Patient and Spouse Education method: Explanation, Demonstration, Tactile cues, Verbal cues, and Handouts Education comprehension: verbalized understanding, returned demonstration, verbal cues required, tactile cues required, and needs further education     HOME EXERCISE PROGRAM: Access Code: [email or web address] URL: [email or web address] Date: 02/09/2022 Prepared by: Vista Mink  Exercises - Supine Quadricep Sets  - 3-5 x daily - 7 x weekly - 2-3 sets - 10 reps - 5 second hold - Small Range Straight Leg Raise  - 3-5 x daily - 1 x weekly - 1 sets - 5 reps - 3 seconds hold - Tandem Stance  - 2-3 x daily - 7 x  weekly - 1 sets - 5 reps - 20 second hold - Sit to Stand with Armchair  - 2 x daily - 7 x weekly - 1 sets - 5 reps - Standing Lumbar Extension at Wall - Forearms  - 5 x daily - 7 x weekly - 1 sets - 5 reps - 3 seconds hold - Clamshell  - 2 x daily - 7 x weekly - 2 sets - 10 reps - 3 seconds hold   ASSESSMENT:   CLINICAL IMPRESSION: Quadriceps, hip abductors and lower extremity strengthening continue to be the focus with Larry's supervised and home program.  He has had some increased hip and back pain this week, likely due to his gait and weak hip abductors.  His HEP and clinic program are addressing these areas and a progression of his hip abductors strengthening was added today (Blue theraband with clams).   Continue current POC.   OBJECTIVE IMPAIRMENTS Abnormal gait, decreased activity tolerance, decreased endurance, difficulty walking, decreased ROM, decreased strength, increased edema, impaired perceived functional ability, impaired flexibility, and pain.    ACTIVITY LIMITATIONS carrying, lifting, bending, standing, squatting, stairs, and locomotion level   PARTICIPATION LIMITATIONS: driving, community activity, and going to the gym   PERSONAL FACTORS Type 2 DM, renal insufficiency, previous stroke, HTN, HLD, previous L shoulder surgery are also affecting patient's functional outcome.    REHAB POTENTIAL: Good   CLINICAL DECISION MAKING: Stable/uncomplicated   EVALUATION COMPLEXITY: Low     GOALS: Goals reviewed with patient? Yes   SHORT TERM GOALS: Target date: 02/02/2022  Improve R quadriceps strength as assessed by his ability to complete a set of 5 seated straight leg raises without quadriceps lag Baseline: Quadriceps lag evident on rep 1 Goal status: Better but still On Going 02/11/2022   2.  [name] [name] will be independent with his day 1 HEP  Baseline: Started 01/05/2022 Goal status: Met 01/27/2022   3.  [name] [name] will not need pain medication for his R knee Baseline: Prescription pain  meds PRN Goal status: 1X this week 02/11/2022     LONG TERM GOALS: Target date: 03/02/2022    Improve FOTO to 62 in 13 visits Baseline: 47 at visit 1 Goal status: Met 01/27/2022   2.  Improve R thigh/knee pain to consistently 0-2/10 on the Numeric Pain Rating Scale Baseline: 0-5/10 Goal status: On Going 02/11/2022   3.  Improve R knee AROM for flexion to pain-free 130 degrees Baseline: 113 degrees with pain at end range Goal status: On Going (118) 01/27/2022   4.  Improve R quadriceps strength as assessed by improved thigh girth assessed 15 cm proximal to the superior patellar pole Baseline: 50 cm Goal status: On Going, improved 01/27/2022   5.  [name] [name] will be independent with his long-term HEP at [place] Baseline: Started 01/05/2022 Goal status: On Going 02/11/2022   PLAN: PT FREQUENCY: 2x/week   PT DURATION: 8 weeks   PLANNED INTERVENTIONS: Therapeutic exercises, Therapeutic activity, Neuromuscular re-education, Balance training, Gait training, Patient/Family education, Stair training, Electrical stimulation, Cryotherapy, Vasopneumatic device, and BFR   PLAN FOR NEXT SESSION: Progress functional quadriceps strength, balance work, limited range step downs/up and over with slow eccentrics, limited range leg press with slow eccentrics.  Check seated SLR 1X/week.  Vaso to end as needed.      Farley Ly, PT, MPT 02/11/2022, 3:27 PM

## 2022-02-14 ENCOUNTER — Ambulatory Visit (INDEPENDENT_AMBULATORY_CARE_PROVIDER_SITE_OTHER): Payer: Medicare Other | Admitting: Pharmacist

## 2022-02-14 DIAGNOSIS — E1122 Type 2 diabetes mellitus with diabetic chronic kidney disease: Secondary | ICD-10-CM

## 2022-02-14 DIAGNOSIS — I129 Hypertensive chronic kidney disease with stage 1 through stage 4 chronic kidney disease, or unspecified chronic kidney disease: Secondary | ICD-10-CM

## 2022-02-14 NOTE — Progress Notes (Signed)
Chronic Care Management Pharmacy Note  02/14/2022 Name:  [name] [name] MRN:  [id number] DOB:  1945/10/05  Summary: BP home readings are mostly controlled A1c at goal < 8% and patient is having some lower readings LDL not at goal < 70  Recommendations/Changes made from today's visit: -Recommended trial of decreasing dose of hydralazine and discussing with nephrologist  -Recommended decreasing Lantus to 3 units in the evening due to some morning lows -Consider the addition of bempedoic acid or inclisiran for LDL lowering  Plan: Scheduled CPE Follow up in 4 months  Subjective: [name] [name] is an 76 y.o. year old male who is a primary patient of Dorothyann Peng, NP.  The CCM team was consulted for assistance with disease management and care coordination needs.    Engaged with patient by telephone for follow up visit in response to provider referral for pharmacy case management and/or care coordination services.   Consent to Services:  The patient was given information about Chronic Care Management services, agreed to services, and gave verbal consent prior to initiation of services.  Please see initial visit note for detailed documentation.   Patient Care Team: Dorothyann Peng, NP as PCP - General (Family Medicine) Adegoroye, Wynona Luna, MD (Nephrology) Viona Gilmore, [facility] as Pharmacist (Pharmacist)  Recent office visits: 11/16/21 Dorothyann Peng, NP: Patient presented for DM follow up. No change in medications. Consider water therapy.  Recent consult visits: 02/11/22 Vista Mink, PT (ortho): Patient presented for PT treatment for difficulty walking.  02/03/22 Frankey Shown, MD (ortho): Patient presented for right knee pain follow up. Follow up in 4 weeks.  01/13/22 Frankey Shown, MD (ortho): Patient presented for right knee pain follow up. Follow up in 4 weeks.  12/31/21 Patient presented for colonoscopy.  12/29/21 Owens Loffler, MD (gastro): Patient presented for positive  cologuard test screening. Plan for colonoscopy.  12/24/21 Gifford Shave, MD (nephrology): Patient presented for CKD follow up. Decreased calcitriol to every other day.  12/22/21 Frankey Shown, MD (ortho): Patient presented for right knee pain follow up. Referred to PT. Follow up in 3 weeks.  12/10/21 Frankey Shown, MD (ortho): Patient presented for right knee pain follow up. Follow up in 2 weeks.  11/24/21 Frankey Shown, MD (ortho): Patient presented for right knee pain follow up. Prescribed tramadol.  11/02/21 Ardis Rowan, PA-C (vascular surgery): Patient presented for PAD follow up. Patient has agreed to talk to his PCP about starting Pravastatin for optimal CVE protection. Recommend rechallenge with Pravastatin $RemoveBeforeDE'80mg'sWlwyYjmruXebIw$  and stepping down by half if intolerable until he reaches a dose he can take.  Hospital visits: None in previous 6 months  Objective:  Lab Results  Component Value Date   CREATININE 2.70 (H) 05/20/2021   BUN 43 (H) 05/20/2021   GFR 22.52 (L) 05/20/2021   GFRNONAA 23 (L) 08/07/2020   GFRAA 30 05/24/2019   NA 138 05/20/2021   K 4.3 05/20/2021   CALCIUM 9.2 05/20/2021   CO2 24 05/20/2021   GLUCOSE 188 (H) 05/20/2021    Lab Results  Component Value Date/Time   HGBA1C 7.1 (A) 11/16/2021 11:24 AM   HGBA1C 7.9 (A) 08/19/2021 07:09 AM   HGBA1C 7.2 (H) 05/20/2021 10:09 AM   HGBA1C 6.5 01/08/2021 08:18 AM   HGBA1C 6.5 06/19/2018 08:43 AM   HGBA1C 5.5 03/21/2018 07:14 AM   GFR 22.52 (L) 05/20/2021 10:09 AM   GFR 23.19 (L) 01/08/2021 08:18 AM   MICROALBUR 2.2 (H) 02/13/2014 09:53 AM   MICROALBUR 2.4 (H) 09/30/2013 09:50  AM    Last diabetic Eye exam:  Lab Results  Component Value Date/Time   HMDIABEYEEXA No Retinopathy 03/24/2021 12:00 AM    Last diabetic Foot exam:  Lab Results  Component Value Date/Time   HMDIABFOOTEX done 09/30/2013 12:00 AM     Lab Results  Component Value Date   CHOL 193 01/08/2021   HDL 44.10 01/08/2021   LDLCALC 125 (H) 01/08/2021    LDLDIRECT 180.7 06/24/2013   TRIG 115.0 01/08/2021   CHOLHDL 4 01/08/2021       Latest Ref Rng & Units 01/08/2021    8:18 AM 08/07/2020    8:53 AM 01/15/2020    8:11 AM  Hepatic Function  Total Protein 6.0 - 8.3 g/dL 6.8  6.9  6.6   Albumin 3.5 - 5.2 g/dL 4.3  3.9    AST 0 - 37 U/L 13  14  15    ALT 0 - 53 U/L 13  14  13    Alk Phosphatase 39 - 117 U/L 47  58    Total Bilirubin 0.2 - 1.2 mg/dL 0.4  0.4  0.4     Lab Results  Component Value Date/Time   TSH 2.43 01/08/2021 08:18 AM   TSH 2.83 01/15/2020 08:11 AM       Latest Ref Rng & Units 01/08/2021    8:18 AM 09/01/2020    7:08 AM 08/07/2020    8:53 AM  CBC  WBC 4.0 - 10.5 K/uL 7.6  6.9  7.2   Hemoglobin 13.0 - 17.0 g/dL 11.1  13.0  12.0   Hematocrit 39.0 - 52.0 % 33.5  38.6  35.8   Platelets 150.0 - 400.0 K/uL 240.0  221.0  217     Lab Results  Component Value Date/Time   VD25OH 45 09/04/2019 12:00 AM   VD25OH 50.29 11/02/2018 08:16 AM   VD25OH 46 09/18/2018 12:00 AM   VD25OH 12.19 (L) 09/19/2017 10:00 AM    Clinical ASCVD: Yes  The ASCVD Risk score (Arnett DK, et al., 2019) failed to calculate for the following reasons:   The patient has a prior MI or stroke diagnosis       11/16/2021    7:09 AM 08/19/2021    7:00 AM 08/16/2021    9:50 AM  Depression screen PHQ 2/9  Decreased Interest 1 2 0  Down, Depressed, Hopeless 1 1 1   PHQ - 2 Score 2 3 1   Altered sleeping 2 2   Tired, decreased energy 1 2   Change in appetite 2 3   Feeling bad or failure about yourself  1 1   Trouble concentrating 1 0   Moving slowly or fidgety/restless 1 2   Suicidal thoughts 0 0   PHQ-9 Score 10 13   Difficult doing work/chores Somewhat difficult Somewhat difficult       Social History   Tobacco Use  Smoking Status Never  Smokeless Tobacco Never   BP Readings from Last 3 Encounters:  12/31/21 (!) 145/59  12/29/21 (!) 170/90  11/16/21 130/80   Pulse Readings from Last 3 Encounters:  12/31/21 64  12/29/21 64  11/16/21 60    Wt Readings from Last 3 Encounters:  12/31/21 227 lb (103 kg)  12/29/21 227 lb 2 oz (103 kg)  11/16/21 228 lb (103.4 kg)   BMI Readings from Last 3 Encounters:  12/31/21 30.79 kg/m  12/29/21 30.17 kg/m  11/16/21 28.50 kg/m    Assessment/Interventions: Review of patient past medical history, allergies, medications, health status, including  review of consultants reports, laboratory and other test data, was performed as part of comprehensive evaluation and provision of chronic care management services.   SDOH:  (Social Determinants of Health) assessments and interventions performed: Yes SDOH Interventions    Flowsheet Row Most Recent Value  SDOH Interventions   Financial Strain Interventions Intervention Not Indicated       SDOH Screenings   Alcohol Screen: Low Risk  (08/16/2021)   Alcohol Screen    Last Alcohol Screening Score (AUDIT): 0  Depression (PHQ2-9): Medium Risk (11/16/2021)   Depression (PHQ2-9)    PHQ-2 Score: 10  Financial Resource Strain: Low Risk  (02/14/2022)   Overall Financial Resource Strain (CARDIA)    Difficulty of Paying Living Expenses: Not hard at all  Food Insecurity: No Food Insecurity (08/16/2021)   Hunger Vital Sign    Worried About Running Out of Food in the Last Year: Never true    [place] in the Last Year: Never true  Housing: Low Risk  (08/16/2021)   Housing    Last Housing Risk Score: 0  Physical Activity: Inactive (08/16/2021)   Exercise Vital Sign    Days of Exercise per Week: 0 days    Minutes of Exercise per Session: 0 min  Social Connections: Socially Integrated (08/16/2021)   Social Connection and Isolation Panel [NHANES]    Frequency of Communication with Friends and Family: More than three times a week    Frequency of Social Gatherings with Friends and Family: More than three times a week    Attends Religious Services: More than 4 times per year    Active Member of [employer] or Organizations: Yes    Attends Theatre manager Meetings: More than 4 times per year    Marital Status: Married  Stress: No Stress Concern Present (08/16/2021)   [employer] [place]    Feeling of Stress : Only a little  Tobacco Use: Low Risk  (02/11/2022)   Patient History    Smoking Tobacco Use: Never    Smokeless Tobacco Use: Never    Passive Exposure: Not on file  Transportation Needs: No Transportation Needs (08/16/2021)   PRAPARE - Transportation    Lack of Transportation (Medical): No    Lack of Transportation (Non-Medical): No    CCM Care Plan  Allergies  Allergen Reactions   Evolocumab Hypertension    Other reaction(s): Other (see comments)   Bactrim [Sulfamethoxazole-Trimethoprim] Hives, Itching and Swelling   Penicillins Hives    Has patient had a PCN reaction causing immediate rash, facial/tongue/throat swelling, SOB or lightheadedness with hypotension: Unk Has patient had a PCN reaction causing severe rash involving mucus membranes or skin necrosis: Unk Has patient had a PCN reaction that required hospitalization: Unk Has patient had a PCN reaction occurring within the last 10 years: No If all of the above answers are "NO", then may proceed with Cephalosporin use. Other reaction(s): Other (see comments)   Statins     myalgia Other reaction(s): Other (see comments)   Sulfa Antibiotics     Other reaction(s): Other (see comments)   Sulfa Drugs Cross Reactors Hives, Itching and Swelling   Sulfamethoxazole-Trimethoprim Hives and Itching    Other reaction(s): Other (see comments)    Medications Reviewed Today     Reviewed by Maricela Bo, PT (Physical Therapist) on 02/11/22 at 1523  Med List Status: <None>   Medication Order Taking? Sig Documenting Provider Last Dose Status Informant  amLODipine (NORVASC)  5 MG tablet [id number] No Take 5 mg by mouth in the morning and at bedtime. [provider] Taking Active   aspirin 325 MG tablet  [id number] No Take 325 mg by mouth daily. [provider] Taking Active   B Complex Vitamins (VITAMIN B COMPLEX) TABS [id number] No Take by mouth. [provider] Taking Active   Blood Glucose Monitoring Suppl [facility] VERIO) w/Device KIT [id number] No Use with existing test strips Nafziger, [name], NP Taking Active   brimonidine (ALPHAGAN) 0.2 % ophthalmic solution [id number] No Place 1 drop into both eyes 2 (two) times daily. [provider] Taking Active Self  brimonidine-timolol (COMBIGAN) 0.2-0.5 % ophthalmic solution [id number] No Apply to eye. [provider] Taking Active   calcitRIOL (ROCALTROL) 0.25 MCG capsule [id number] No Take 1 capsule by mouth daily. [provider] Taking Active Multiple Informants  Cholecalciferol 50 MCG (2000 UT) CAPS [id number] No Take 1 capsule by mouth daily. [provider] Taking Active   ezetimibe (ZETIA) 10 MG tablet [id number] No TAKE 1 TABLET(10 MG) BY MOUTH DAILY Nafziger, [name], NP Taking Active   ferrous sulfate 325 (65 FE) MG tablet [id number] No Take 325 mg by mouth 2 (two) times daily with a meal. [provider] Taking Active   hydrALAZINE (APRESOLINE) 100 MG tablet [id number] No Take 100 mg by mouth 3 (three) times daily. [provider] Taking Active   insulin glargine (LANTUS SOLOSTAR) 100 UNIT/ML Solostar Pen [id number] No INJECT 7 TO 8 UNITS UNDER THE SKIN EVERY NIGHT AT BEDTIME Nafziger, [name], NP Taking Active   Insulin Pen Needle (B-D UF III MINI PEN NEEDLES) 31G X 5 MM MISC [id number] No USE TO TEST BLOOD GLUCOSE FOUR TIMES DAILY Nafziger, [name], NP Taking Active   labetalol (NORMODYNE) 200 MG tablet [id number] No TAKE 1 TABLET(200 MG) BY MOUTH THREE TIMES DAILY Nafziger, [name], NP Taking Active   nateglinide (STARLIX) 60 MG tablet [id number] No TAKE 1 TABLET BY MOUTH ONCE A DAY WITH MEALS [provider] Taking Active   ONETOUCH VERIO test strip [id number] No USE TO TEST TWICE DAILY  AS DIRECTED Nafziger, [name], NP Taking Active   traMADol (ULTRAM) 50 MG tablet [id number] No Take 1-2 tablets (50-100 mg total) by mouth daily as needed. Aundra Dubin, PA-C Taking Active   valsartan (DIOVAN) 160 MG tablet [id number] No Take 80 mg by mouth 2 (two) times daily. [provider] Taking Active             Patient Active Problem List   Diagnosis Date Noted   Strain of quadriceps, right, initial encounter 12/10/2021   Acute pain of right knee 11/24/2021   Drug-induced myopathy 01/08/2020   Iron deficiency anemia 04/03/2019   Anemia of chronic renal failure, stage 4 (severe) ([place]) 04/03/2019   Late effect of cerebrovascular accident (CVA) 12/07/2017   Hypoglycemia    Uncontrolled hypertension    Hypokalemia    Acute ischemic left MCA stroke ([place]) 07/12/2017   Diabetes mellitus type 2 in obese ([place])    Stage 3 chronic kidney disease ([place])    Left middle cerebral artery stroke ([place]) 34/19/6222   Acute embolic stroke ([place])    Aphasia 07/08/2017   Gout 07/21/2011   Prostatitis, chronic 07/14/2010   Hyperlipidemia 06/18/2007   Hypertensive kidney disease 01/02/2007    Immunization History  Administered Date(s) Administered   Influenza, High Dose Seasonal PF 06/19/2018   PFIZER(Purple Top)SARS-COV-2 Vaccination 01/18/2020, 02/08/2020, 05/21/2021   Pneumococcal Conjugate-13 01/03/2019   Pneumococcal Polysaccharide-23  12/20/2011   Td 11/13/2009   Patient's did decrease his hydralazine to 50 mg TID and his BP was doing better then. She felt like she shouldn't do this so she went back to his normal dose. Patient has had lower readings 120s - 140s recently. Patient does have higher readings when he sees nephrologist.   Patient is having an OK day today. He has been off and on with how he is doing. He is working hard in PT to strengthen the ligaments and the exercises are getting more difficult. Patient got the brace taken off and he is trying to go without it.  Patient has a prescription for tramadol and only uses this PRN.  Patient and wife were disappointed with the visit with vascular surgery appointment. They were unsure of the sleep apnea referral. The PA was pushing for him to start on a statin and he was against doing this and didn't feel like they got their questions answered.  Conditions to be addressed/monitored:  Hypertension, Hyperlipidemia, Diabetes, Chronic Kidney Disease, Gout and glaucoma, anemia, history of stroke  Conditions addressed this visit: Diabetes, hypertension, hyperlipidemia  Care Plan : CCM Pharmacy Care Plan  Updates made by Viona Gilmore, [facility] since 02/14/2022 12:00 AM     Problem: Problem: Hypertension, Hyperlipidemia, Diabetes, Chronic Kidney Disease, Gout and glaucoma, anemia, history of stroke      Long-Range Goal: Patient-Specific Goal   Start Date: 09/30/2020  Expected End Date: 09/30/2021  Recent Progress: On track  Priority: High  Note:   Current Barriers:  Unable to maintain control of blood pressure Suboptimal therapeutic regimen for cholesterol  Pharmacist Clinical Goal(s):  Patient will maintain control of blood pressure as evidenced by home blood pressure readings  through collaboration with PharmD and provider.    Interventions: 1:1 collaboration with Dorothyann Peng, NP regarding development and update of comprehensive plan of care as evidenced by provider attestation and co-signature Inter-disciplinary care team collaboration (see longitudinal plan of care) Comprehensive medication review performed; medication list updated in electronic medical record  Hypertension (BP goal <140/90) -Controlled -Current treatment: Amlodipine 5 mg 1 tablet twice daily - Appropriate, Query effective, Safe, Accessible Hydralazine 100 mg 1 tablet three times daily - Appropriate, Query effective, Safe, Accessible Labetalol 200 mg 1 tablet three times daily - Appropriate, Query effective, Safe,  Accessible Valsartan 160 mg 1/2 tablet twice daily - Appropriate, Query effective, Safe, Accessible -Medications previously tried: benazepril atenolol-chlorthalidone  -Current home readings:mostly 120-140s with a couple of higher readings with more pain -Current dietary habits: patient is limiting salt and trying to eat cleaner since the stroke -Current exercise habits: going to the gym and using an elliptical 3 days a week -Denies hypotensive/hypertensive symptoms -Educated on Exercise goal of 150 minutes per week; Importance of home blood pressure monitoring; Proper BP monitoring technique; -Counseled to monitor BP at home daily, document, and provide log at future appointments -Counseled on diet and exercise extensively Recommended to continue current medication Recommended trial of lower dose of hydralazine and discussion with nephrologist.  Hyperlipidemia: (LDL goal < 70) -Uncontrolled -Current treatment: Ezetimibe 10 mg 1 tablet daily - Appropriate, Query effective, Safe, Accessible -Medications previously tried: Repatha (high BP), statins (myalgias) (pravastatin, atorvastatin, simvastatin) -Current dietary patterns: baking foods instead of frying; eating lots of fiber; has been consuming more ice cream lately -Current exercise habits: not active right now -Educated on Cholesterol goals;  Importance of limiting foods high in cholesterol; Exercise goal of 150 minutes per week; -Counseled on diet  and exercise extensively Recommended to continue current medication Recommended limiting ice cream consumption.  History of stroke (Goal: prevent recurrent strokes) -Controlled -Current treatment  Aspirin 325 mg 1 tablet daily - Appropriate, Effective, Query Safe, Accessible -Medications previously tried: none  -Recommended to continue current medication Counseled on monitoring for signs of bleeding such as unexplained and excessive bleeding from a cut or injury, easy or excessive  bruising, blood in urine or stools, and nosebleeds without a known cause  Diabetes (A1c goal <8%) (per PCP) -Controlled -Current medications: Lantus inject 9 units in the morning and 5 units in the evening - Appropriate, Query effective, Safe, Accessible Nateglinide 60 mg 1 tablet once daily with meals (needs to be 15 mins before meals) - Appropriate, Query effective, Safe, Accessible -Medications previously tried: glyburide, Novolog, Januvia -Current home glucose readings fasting glucose: 119 (felt lethargic), 139 - morning and evening are about the same, evening is higher some post prandial glucose: not checking after meals -Reports hypoglycemic/hyperglycemic symptoms -Current meal patterns:  breakfast: did not discuss  lunch: did not discuss   dinner: did not discuss  snacks: did not discuss  drinks: did not discuss  -Current exercise: has the intention of going back to the gym - wants to get back to the pool -Educated on Exercise goal of 150 minutes per week; Benefits of routine self-monitoring of blood sugar; Continuous glucose monitoring; Carbohydrate counting and/or plate method -Counseled to check feet daily and get yearly eye exams -Counseled on diet and exercise extensively Recommended to continue current medication Recommended checking blood sugars after meals to make sure it is not running lower.  Anemia (Goal: Hgb > 11) -Controlled -Current treatment  Ferrous sulfate 325 mg 1 tablet daily - Appropriate, Effective, Safe, Accessible -Medications previously tried: none  -Recommended to continue current medication Counseled on taking with food to prevent upset stomach  Chronic kidney disease (Goal: prevent worsening of kidney function) -Controlled -Current treatment  No medications -Medications previously tried: none  -Counseled on diet and exercise extensively  Glaucoma (Goal: lower intraocular pressure) -Controlled -Current treatment  Alphagan 0.2% daily -  Appropriate, Effective, Safe, Accessible Cosopt 22.3-6.8 daily - Appropriate, Effective, Safe, Accessible -Medications previously tried: none  -Recommended to continue current medication  Health Maintenance -Vaccine gaps: tetanus, shingles -Current therapy:  Vitamin D 2000 units daily Vitamin B complex daily -Educated on Cost vs benefit of each product must be carefully weighed by individual consumer -Patient is satisfied with current therapy and denies issues -Recommended to continue current medication  Patient Goals/Self-Care Activities Patient will:  - take medications as prescribed check glucose daily, document, and provide at future appointments check blood pressure a few times daily, document, and provide at future appointments  Follow Up Plan: The care management team will reach out to the patient again over the next 7 days.         Medication Assistance: None required.  Patient affirms current coverage meets needs.  Compliance/Adherence/Medication fill history: Care Gaps: Shingrix, COVID booster, influenza vaccine Last BP - 170/90 on 12/29/2021 Last A1C - 7.1 on 11/16/2021  Star-Rating Drugs: Nateglinide 60mg  - last filled 12/13/2021 90 DS at [facility] Valsartan 160mg  - last filled 01/14/2022 90 DS at [facility]  Patient's preferred pharmacy is:  [facility], [address] AT [address] [employer] [place] [address] Phone: [phone] Fax: [phone]   Uses pill box? No - basket on the counter Pt endorses 100% compliance  We discussed: Current pharmacy is preferred with insurance plan and patient is satisfied with pharmacy services Patient  to: Continue current medication management strategy  Care Plan and Follow Up Patient Decision:  Patient agrees to Care Plan and Follow-up.  Plan: The care management team will reach out to the patient again over the next 30 days.  Jeni Salles, PharmD [facility] Clinical Pharmacist [place] [facility] [place]

## 2022-02-14 NOTE — Patient Instructions (Signed)
Hi Rual and Emerson,  It was great to catch up again! I will let you know what [name] says later this week.  Please reach out to me if you have any questions or need anything!  Best, Maddie  Jeni Salles, PharmD, [facility]   Visit Information   Goals Addressed   None    Patient Care Plan: CCM Pharmacy Care Plan     Problem Identified: Problem: Hypertension, Hyperlipidemia, Diabetes, Chronic Kidney Disease, Gout and glaucoma, anemia, history of stroke      Long-Range Goal: Patient-Specific Goal   Start Date: 09/30/2020  Expected End Date: 09/30/2021  Recent Progress: On track  Priority: High  Note:   Current Barriers:  Unable to maintain control of blood pressure Suboptimal therapeutic regimen for cholesterol  Pharmacist Clinical Goal(s):  Patient will maintain control of blood pressure as evidenced by home blood pressure readings  through collaboration with PharmD and provider.    Interventions: 1:1 collaboration with Dorothyann Peng, NP regarding development and update of comprehensive plan of care as evidenced by provider attestation and co-signature Inter-disciplinary care team collaboration (see longitudinal plan of care) Comprehensive medication review performed; medication list updated in electronic medical record  Hypertension (BP goal <140/90) -Controlled -Current treatment: Amlodipine 5 mg 1 tablet twice daily - Appropriate, Query effective, Safe, Accessible Hydralazine 100 mg 1 tablet three times daily - Appropriate, Query effective, Safe, Accessible Labetalol 200 mg 1 tablet three times daily - Appropriate, Query effective, Safe, Accessible Valsartan 160 mg 1/2 tablet twice daily - Appropriate, Query effective, Safe, Accessible -Medications previously tried: benazepril atenolol-chlorthalidone  -Current home readings:mostly 120-140s with a couple of higher readings with more pain -Current  dietary habits: patient is limiting salt and trying to eat cleaner since the stroke -Current exercise habits: going to the gym and using an elliptical 3 days a week -Denies hypotensive/hypertensive symptoms -Educated on Exercise goal of 150 minutes per week; Importance of home blood pressure monitoring; Proper BP monitoring technique; -Counseled to monitor BP at home daily, document, and provide log at future appointments -Counseled on diet and exercise extensively Recommended to continue current medication Recommended trial of lower dose of hydralazine and discussion with nephrologist.  Hyperlipidemia: (LDL goal < 70) -Uncontrolled -Current treatment: Ezetimibe 10 mg 1 tablet daily - Appropriate, Query effective, Safe, Accessible -Medications previously tried: Repatha (high BP), statins (myalgias) (pravastatin, atorvastatin, simvastatin) -Current dietary patterns: baking foods instead of frying; eating lots of fiber; has been consuming more ice cream lately -Current exercise habits: not active right now -Educated on Cholesterol goals;  Importance of limiting foods high in cholesterol; Exercise goal of 150 minutes per week; -Counseled on diet and exercise extensively Recommended to continue current medication Recommended limiting ice cream consumption.  History of stroke (Goal: prevent recurrent strokes) -Controlled -Current treatment  Aspirin 325 mg 1 tablet daily - Appropriate, Effective, Query Safe, Accessible -Medications previously tried: none  -Recommended to continue current medication Counseled on monitoring for signs of bleeding such as unexplained and excessive bleeding from a cut or injury, easy or excessive bruising, blood in urine or stools, and nosebleeds without a known cause  Diabetes (A1c goal <8%) (per PCP) -Controlled -Current medications: Lantus inject 9 units in the morning and 5 units in the evening - Appropriate, Query effective, Safe,  Accessible Nateglinide 60 mg 1 tablet once daily with meals (needs to be 15 mins before meals) - Appropriate, Query effective, Safe, Accessible -Medications previously tried: glyburide, Novolog, Januvia -Current  home glucose readings fasting glucose: 119 (felt lethargic), 139 - morning and evening are about the same, evening is higher some post prandial glucose: not checking after meals -Reports hypoglycemic/hyperglycemic symptoms -Current meal patterns:  breakfast: did not discuss  lunch: did not discuss   dinner: did not discuss  snacks: did not discuss  drinks: did not discuss  -Current exercise: has the intention of going back to the gym - wants to get back to the pool -Educated on Exercise goal of 150 minutes per week; Benefits of routine self-monitoring of blood sugar; Continuous glucose monitoring; Carbohydrate counting and/or plate method -Counseled to check feet daily and get yearly eye exams -Counseled on diet and exercise extensively Recommended to continue current medication Recommended checking blood sugars after meals to make sure it is not running lower.  Anemia (Goal: Hgb > 11) -Controlled -Current treatment  Ferrous sulfate 325 mg 1 tablet daily - Appropriate, Effective, Safe, Accessible -Medications previously tried: none  -Recommended to continue current medication Counseled on taking with food to prevent upset stomach  Chronic kidney disease (Goal: prevent worsening of kidney function) -Controlled -Current treatment  No medications -Medications previously tried: none  -Counseled on diet and exercise extensively  Glaucoma (Goal: lower intraocular pressure) -Controlled -Current treatment  Alphagan 0.2% daily - Appropriate, Effective, Safe, Accessible Cosopt 22.3-6.8 daily - Appropriate, Effective, Safe, Accessible -Medications previously tried: none  -Recommended to continue current medication  Health Maintenance -Vaccine gaps: tetanus,  shingles -Current therapy:  Vitamin D 2000 units daily Vitamin B complex daily -Educated on Cost vs benefit of each product must be carefully weighed by individual consumer -Patient is satisfied with current therapy and denies issues -Recommended to continue current medication  Patient Goals/Self-Care Activities Patient will:  - take medications as prescribed check glucose daily, document, and provide at future appointments check blood pressure a few times daily, document, and provide at future appointments  Follow Up Plan: The care management team will reach out to the patient again over the next 7 days.         Patient verbalizes understanding of instructions and care plan provided today and agrees to view in [facility]. Active MyChart status and patient understanding of how to access instructions and care plan via MyChart confirmed with patient.    The pharmacy team will reach out to the patient again over the next 7 days.   Viona Gilmore, [facility]

## 2022-02-15 ENCOUNTER — Telehealth: Payer: Self-pay | Admitting: Pharmacist

## 2022-02-15 ENCOUNTER — Other Ambulatory Visit: Payer: Self-pay | Admitting: Adult Health

## 2022-02-15 DIAGNOSIS — E1122 Type 2 diabetes mellitus with diabetic chronic kidney disease: Secondary | ICD-10-CM

## 2022-02-15 MED ORDER — LANTUS SOLOSTAR 100 UNIT/ML ~~LOC~~ SOPN
9.0000 [IU] | PEN_INJECTOR | Freq: Two times a day (BID) | SUBCUTANEOUS | 0 refills | Status: DC
Start: 2022-02-15 — End: 2022-08-30

## 2022-02-15 NOTE — Telephone Encounter (Signed)
Called patient's wife after telephone visit yesterday to follow up on insulin dose change. Patient and wife are aware to decrease his evening dose to 3 units and continue on 9 units in the morning. Per request, PCP also sent in a prescription for up to 9 units BID so the pharmacy would give him a fill box of insulin each time rather that partial fills. Patient's wife verbalized her understanding and appreciated the communication.

## 2022-02-16 ENCOUNTER — Encounter: Payer: Self-pay | Admitting: Rehabilitative and Restorative Service Providers"

## 2022-02-16 ENCOUNTER — Ambulatory Visit: Payer: Medicare Other | Admitting: Rehabilitative and Restorative Service Providers"

## 2022-02-16 DIAGNOSIS — M25561 Pain in right knee: Secondary | ICD-10-CM | POA: Diagnosis not present

## 2022-02-16 DIAGNOSIS — M6281 Muscle weakness (generalized): Secondary | ICD-10-CM

## 2022-02-16 DIAGNOSIS — M25661 Stiffness of right knee, not elsewhere classified: Secondary | ICD-10-CM

## 2022-02-16 DIAGNOSIS — R262 Difficulty in walking, not elsewhere classified: Secondary | ICD-10-CM

## 2022-02-16 NOTE — Therapy (Signed)
OUTPATIENT PHYSICAL THERAPY TREATMENT  Patient Name: [name] [name] MRN: [id number] DOB:05/15/1946, 76 y.o., male Today's Date: 02/16/2022  PCP: [name] Nafziger, NP REFERRING PROVIDER: Mary L Stanbery, PA-C    END OF SESSION:   PT End of Session - 02/16/22 1430     Visit Number 9    Number of Visits 16    PT Start Time 1347    PT Stop Time 1428    PT Time Calculation (min) 41 min    Activity Tolerance Patient tolerated treatment well;No increased pain    Behavior During Therapy WFL for tasks assessed/performed               Past Medical History:  Diagnosis Date   Anemia    Cataract    DIABETES MELLITUS, TYPE II 01/02/2007   HYPERLIPIDEMIA 06/18/2007   HYPERTENSION 01/02/2007   RENAL INSUFFICIENCY 02/05/2008   Stroke (HCC) 2019   Past Surgical History:  Procedure Laterality Date   LOOP RECORDER INSERTION N/A 07/11/2017   Procedure: LOOP RECORDER INSERTION;  Surgeon: Allred, James, MD;  Location: [facility];  Service: Cardiovascular;  Laterality: N/A;   SHOULDER SURGERY     left   TEE WITHOUT CARDIOVERSION N/A 07/11/2017   Procedure: TRANSESOPHAGEAL ECHOCARDIOGRAM (TEE);  Surgeon: Nelson, Katarina H, MD;  Location: [facility] ENDOSCOPY;  Service: Cardiovascular;  Laterality: N/A;   TRANSURETHRAL RESECTION OF PROSTATE     history of retention/hematuria    Patient Active Problem List   Diagnosis Date Noted   Strain of quadriceps, right, initial encounter 12/10/2021   Acute pain of right knee 11/24/2021   Drug-induced myopathy 01/08/2020   Iron deficiency anemia 04/03/2019   Anemia of chronic renal failure, stage 4 (severe) (HCC) 04/03/2019   Late effect of cerebrovascular accident (CVA) 12/07/2017   Hypoglycemia    Uncontrolled hypertension    Hypokalemia    Acute ischemic left MCA stroke (HCC) 07/12/2017   Diabetes mellitus type 2 in obese (HCC)    Stage 3 chronic kidney disease (HCC)    Left middle cerebral artery stroke (HCC) 07/09/2017   Acute embolic  stroke (HCC)    Aphasia 07/08/2017   Gout 07/21/2011   Prostatitis, chronic 07/14/2010   Hyperlipidemia 06/18/2007   Hypertensive kidney disease 01/02/2007    REFERRING DIAG: S76.111A (ICD-10-CM) - Strain of quadriceps, right, initial encounter   THERAPY DIAG:  Difficulty in walking, not elsewhere classified  Muscle weakness (generalized)  Stiffness of right knee, not elsewhere classified  Acute pain of right knee  Rationale for Evaluation and Treatment Rehabilitation  PERTINENT HISTORY: Type 2 DM, renal insufficiency, previous stroke, HTN, HLD, L shoulder surgery  PRECAUTIONS: No longer in knee brace as of 02/09/2022  SUBJECTIVE: [name] [name] reports he is having less difficulty with ADLs around the house, making his bed for example.  His L groin pain is intermittent.  He also notes similar low back pain over the past week, likely due to changes in his gait.  PAIN:  Are you having pain? Yes: NPRS scale: 0-5/10 Pain location: Inferior patellar pole and suprapatellar, some LBP and L groin Pain description: Ache Aggravating factors: Prolonged sitting Relieving factors: Pain medication   OBJECTIVE: (objective measures completed at initial evaluation unless otherwise dated)  OBJECTIVE:    DIAGNOSTIC FINDINGS: High-grade quadriceps tendon tear primarily involving the vastus intermedius component with 1.6 cm retraction. There are few intact anterior, lateral, and medial fibers.   Nondisplaced degenerative tearing of the posterior horn and body of the medial meniscus with   substance loss.   Intrasubstance degenerative signal in the lateral meniscus with extension to the free edge in the lateral meniscal body, indicating a possible nondisplaced degenerative tear.   Tricompartment osteoarthritis, worst in the medial and patellofemoral compartments.   Large joint effusion.   PATIENT SURVEYS:  01/27/2022 FOTO 62 (Goal met) 01/05/2022 FOTO 47 (Goal 62 in 13 visits)   COGNITION:            Overall cognitive status: Within functional limits for tasks assessed                          SENSATION: No complaints of peripheral pain or paresthesias   EDEMA:  Noted but not measured (mild)     LOWER EXTREMITY ROM:   Active ROM Right eval Left eval Right 01/27/2022 Right 02/16/2022  Hip flexion        Hip extension        Hip abduction        Hip adduction        Hip internal rotation        Hip external rotation        Knee flexion 113 130 118 120  Knee extension 1 0 0 0  Ankle dorsiflexion        Ankle plantarflexion        Ankle inversion        Ankle eversion         (Blank rows = not tested)   LOWER EXTREMITY MMT:   Thigh girth assessed 15 cm proximal to the superior patellar pole: R 50 cm and L 51.5 cm   Right eval Left eval Thigh girth R vs L assessed 15 cm proximal to the superior patellar pole  R 52.5 cm and L 53 cm  Hip flexion       Hip extension       Hip abduction       Hip adduction       Hip internal rotation       Hip external rotation       Knee flexion       Knee extension       Ankle dorsiflexion       Ankle plantarflexion       Ankle inversion       Ankle eversion        (Blank rows = not tested)   GAIT: Distance walked: In the office Assistive device utilized:  Knee brace locked at 0-90 degrees Level of assistance: Complete Independence Comments: No mention of AROM limitations in eval.  Focus is on strength and not recommended to push AROM with a tear.       TODAY'S TREATMENT: 02/16/2022: Functional Activities for sit to stand and stairs: Step-down off 4 inch step 2 sets of 10X each slow eccentrics, avoid hyperextension, cues for posture (avoid trunk flexion), visual feedback in a mirror Leg Press R leg only 43#  2 sets of 10X slow eccentrics  Therapeutic Exercises: Side lie clams with Blue theraband resistance 10X B 3 seconds 2 sets Lumbar extension AROM 10X 3 seconds Seated SLR 3X B (still quad lag on R) Knee extension  machine 90-40 degrees with slow eccentrics 10X @ 10# Bridging 10X 5 seconds  Neuromuscular re-education: Tandem balance eyes open 3X each 20+ seconds   02/11/2022: Functional Activities for sit to stand and stairs: Step-down off 4 inch step 3 sets of 10X each slow eccentrics, avoid hyperextension,   cues for posture (avoid trunk flexion) Leg Press R leg only 43#  2 sets of 10X slow eccentrics  Therapeutic Exercises: Side lie hip abduction 10X B 3 seconds (still needs lots of help from PT to avoid rolling back) Side lie clams with Blue theraband resistance 10X B 3 seconds 2 sets Lumbar extension AROM 10X 3 seconds Seated SLR 5X B (still quad lag on R)  Neuromuscular re-education: Tandem balance eyes open and dynamic tandem balance 4X each 20+ seconds   02/09/2022: Functional Activities for sit to stand and stairs: Step-down off 4 inch step 2 sets of 10X each slow eccentrics, avoid hyperextension, cues for posture (avoid trunk flexion) Leg Press R leg only 43#  2 sets of 10X slow eccentrics  Sit to stand with UE use and slow eccentrics 2 sets of 5 (add standing lumbar extension at the top)  Therapeutic Exercises: Side lie hip abduction 10X B 3 seconds (needs lots of help from PT to avoid rolling back) Side lie clams 10X B 3 seconds Lumbar extension AROM 10X 3 seconds  Neuromuscular re-education: Tandem balance eyes open and dynamic tandem balance 4X each 20+ seconds    PATIENT EDUCATION:  Education details: See above Person educated: Patient and Spouse Education method: Explanation, Demonstration, Tactile cues, Verbal cues, and Handouts Education comprehension: verbalized understanding, returned demonstration, verbal cues required, tactile cues required, and needs further education     HOME EXERCISE PROGRAM: Access Code: [email or web address] URL: [email or web address] Date: 02/09/2022 Prepared by: Robert Lovell  Exercises - Supine Quadricep Sets  - 3-5 x daily - 7 x weekly  - 2-3 sets - 10 reps - 5 second hold - Small Range Straight Leg Raise  - 3-5 x daily - 1 x weekly - 1 sets - 5 reps - 3 seconds hold - Tandem Stance  - 2-3 x daily - 7 x weekly - 1 sets - 5 reps - 20 second hold - Sit to Stand with Armchair  - 2 x daily - 7 x weekly - 1 sets - 5 reps - Standing Lumbar Extension at Wall - Forearms  - 5 x daily - 7 x weekly - 1 sets - 5 reps - 3 seconds hold - Clamshell  - 2 x daily - 7 x weekly - 2 sets - 10 reps - 3 seconds hold   ASSESSMENT:   CLINICAL IMPRESSION: Quadriceps, hip abductors and lower extremity strengthening continue to be the focus with [name] [name]'s supervised and home program.  We were able to add some light knee extension machine (90-40 degrees) with slow eccentrics to complement his functional and proprioceptive activities.  Continue strength progressions as appropriate to improve comfort and endurance with WB function.   OBJECTIVE IMPAIRMENTS Abnormal gait, decreased activity tolerance, decreased endurance, difficulty walking, decreased ROM, decreased strength, increased edema, impaired perceived functional ability, impaired flexibility, and pain.    ACTIVITY LIMITATIONS carrying, lifting, bending, standing, squatting, stairs, and locomotion level   PARTICIPATION LIMITATIONS: driving, community activity, and going to the gym   PERSONAL FACTORS Type 2 DM, renal insufficiency, previous stroke, HTN, HLD, previous L shoulder surgery are also affecting patient's functional outcome.    REHAB POTENTIAL: Good   CLINICAL DECISION MAKING: Stable/uncomplicated   EVALUATION COMPLEXITY: Low     GOALS: Goals reviewed with patient? Yes   SHORT TERM GOALS: Target date: 02/02/2022  Improve R quadriceps strength as assessed by his ability to complete a set of 5 seated straight leg raises without quadriceps lag Baseline: Quadriceps lag evident on   rep 1 Goal status: Better but still On Going 02/16/2022   2.  [name] [name] will be independent with his day 1  HEP Baseline: Started 01/05/2022 Goal status: Met 01/27/2022   3.  [name] [name] will not need pain medication for his R knee Baseline: Prescription pain meds PRN Goal status: 1X this week 02/16/2022     LONG TERM GOALS: Target date: 03/02/2022    Improve FOTO to 62 in 13 visits Baseline: 47 at visit 1 Goal status: Met 01/27/2022   2.  Improve R thigh/knee pain to consistently 0-2/10 on the Numeric Pain Rating Scale Baseline: 0-5/10 Goal status: On Going 02/16/2022   3.  Improve R knee AROM for flexion to pain-free 130 degrees Baseline: 113 degrees with pain at end range Goal status: On Going (120) 02/16/2022   4.  Improve R quadriceps strength as assessed by improved thigh girth assessed 15 cm proximal to the superior patellar pole Baseline: 50 cm Goal status: On Going, improved 01/27/2022   5.  [name] [name] will be independent with his long-term HEP at [place] Baseline: Started 01/05/2022 Goal status: On Going 02/16/2022   PLAN: PT FREQUENCY: 2x/week   PT DURATION: 8 weeks   PLANNED INTERVENTIONS: Therapeutic exercises, Therapeutic activity, Neuromuscular re-education, Balance training, Gait training, Patient/Family education, Stair training, Electrical stimulation, Cryotherapy, Vasopneumatic device, and BFR   PLAN FOR NEXT SESSION: Progress functional quadriceps strength, balance work, limited range step downs/up and over with slow eccentrics, limited range leg press, knee extensions with slow eccentrics.  Check seated SLR 1X/week.  Vaso to end as needed.      Farley Ly, PT, MPT 02/16/2022, 2:35 PM

## 2022-02-25 DIAGNOSIS — H2513 Age-related nuclear cataract, bilateral: Secondary | ICD-10-CM | POA: Diagnosis not present

## 2022-02-25 DIAGNOSIS — H0102B Squamous blepharitis left eye, upper and lower eyelids: Secondary | ICD-10-CM | POA: Diagnosis not present

## 2022-02-25 DIAGNOSIS — H35033 Hypertensive retinopathy, bilateral: Secondary | ICD-10-CM | POA: Diagnosis not present

## 2022-02-25 DIAGNOSIS — H40023 Open angle with borderline findings, high risk, bilateral: Secondary | ICD-10-CM | POA: Diagnosis not present

## 2022-02-25 DIAGNOSIS — H34212 Partial retinal artery occlusion, left eye: Secondary | ICD-10-CM | POA: Diagnosis not present

## 2022-02-25 DIAGNOSIS — H0102A Squamous blepharitis right eye, upper and lower eyelids: Secondary | ICD-10-CM | POA: Diagnosis not present

## 2022-02-25 DIAGNOSIS — H353131 Nonexudative age-related macular degeneration, bilateral, early dry stage: Secondary | ICD-10-CM | POA: Diagnosis not present

## 2022-03-02 ENCOUNTER — Telehealth: Payer: Self-pay | Admitting: Pharmacist

## 2022-03-02 NOTE — Chronic Care Management (AMB) (Signed)
    Chronic Care Management Pharmacy Assistant   Name: [name] [name]  MRN: [id number] DOB: 1946/02/27  Reason for Encounter: [place] reports for eye exam.  Spoke with patients wife, she states patient had his eye exam last week 02/24/2022 with Dr. Katy Fitch.  Called Dr. Patrici Ranks office, the note for the visit is complete and they will be faxing over the exam notes.    [place] Pharmacist Assistant [phone]

## 2022-03-03 ENCOUNTER — Encounter: Payer: Self-pay | Admitting: Rehabilitative and Restorative Service Providers"

## 2022-03-03 ENCOUNTER — Ambulatory Visit: Payer: Medicare Other | Admitting: Rehabilitative and Restorative Service Providers"

## 2022-03-03 DIAGNOSIS — R262 Difficulty in walking, not elsewhere classified: Secondary | ICD-10-CM | POA: Diagnosis not present

## 2022-03-03 DIAGNOSIS — N184 Chronic kidney disease, stage 4 (severe): Secondary | ICD-10-CM

## 2022-03-03 DIAGNOSIS — E785 Hyperlipidemia, unspecified: Secondary | ICD-10-CM

## 2022-03-03 DIAGNOSIS — H409 Unspecified glaucoma: Secondary | ICD-10-CM | POA: Diagnosis not present

## 2022-03-03 DIAGNOSIS — M25561 Pain in right knee: Secondary | ICD-10-CM

## 2022-03-03 DIAGNOSIS — M6281 Muscle weakness (generalized): Secondary | ICD-10-CM

## 2022-03-03 DIAGNOSIS — E1122 Type 2 diabetes mellitus with diabetic chronic kidney disease: Secondary | ICD-10-CM

## 2022-03-03 DIAGNOSIS — Z794 Long term (current) use of insulin: Secondary | ICD-10-CM

## 2022-03-03 DIAGNOSIS — I129 Hypertensive chronic kidney disease with stage 1 through stage 4 chronic kidney disease, or unspecified chronic kidney disease: Secondary | ICD-10-CM | POA: Diagnosis not present

## 2022-03-03 DIAGNOSIS — D649 Anemia, unspecified: Secondary | ICD-10-CM

## 2022-03-03 DIAGNOSIS — M25661 Stiffness of right knee, not elsewhere classified: Secondary | ICD-10-CM

## 2022-03-03 NOTE — Therapy (Addendum)
OUTPATIENT PHYSICAL THERAPY TREATMENT/DISCHARGE  Patient Name: [name] [name] MRN: [id number] DOB:19-Jan-1946, 76 y.o., male Today's Date: 03/03/2022  PCP: [name] Peng, NP REFERRING PROVIDER: Aundra Dubin, PA-C   PHYSICAL THERAPY DISCHARGE SUMMARY  Visits from Start of Care: 10  Current functional level related to goals / functional outcomes: See note   Remaining deficits: See note   Education / Equipment: HEP   Patient agrees to discharge. Patient goals were met. Patient is being discharged due to being pleased with the current functional level.   END OF SESSION:   PT End of Session - 03/03/22 1301     Visit Number 10    Number of Visits 16    PT Start Time 1302    PT Stop Time 1346    PT Time Calculation (min) 44 min    Activity Tolerance Patient tolerated treatment well;No increased pain    Behavior During Therapy [facility] for tasks assessed/performed             Past Medical History:  Diagnosis Date   Anemia    Cataract    DIABETES MELLITUS, TYPE II 01/02/2007   HYPERLIPIDEMIA 06/18/2007   HYPERTENSION 01/02/2007   RENAL INSUFFICIENCY 02/05/2008   Stroke ([place]) 2019   Past Surgical History:  Procedure Laterality Date   LOOP RECORDER INSERTION N/A 07/11/2017   Procedure: LOOP RECORDER INSERTION;  Surgeon: [name] Grayer, MD;  Location: [place] [facility];  Service: Cardiovascular;  Laterality: N/A;   SHOULDER SURGERY     left   TEE WITHOUT CARDIOVERSION N/A 07/11/2017   Procedure: TRANSESOPHAGEAL ECHOCARDIOGRAM (TEE);  Surgeon: [name] Spark, MD;  Location: [facility] ENDOSCOPY;  Service: Cardiovascular;  Laterality: N/A;   TRANSURETHRAL RESECTION OF PROSTATE     history of retention/hematuria    Patient Active Problem List   Diagnosis Date Noted   Strain of quadriceps, right, initial encounter 12/10/2021   Acute pain of right knee 11/24/2021   Drug-induced myopathy 01/08/2020   Iron deficiency anemia 04/03/2019   Anemia of chronic renal failure, stage 4  (severe) ([place]) 04/03/2019   Late effect of cerebrovascular accident (CVA) 12/07/2017   Hypoglycemia    Uncontrolled hypertension    Hypokalemia    Acute ischemic left MCA stroke ([place]) 07/12/2017   Diabetes mellitus type 2 in obese ([place])    Stage 3 chronic kidney disease ([place])    Left middle cerebral artery stroke ([place]) 25/00/3704   Acute embolic stroke ([place])    Aphasia 07/08/2017   Gout 07/21/2011   Prostatitis, chronic 07/14/2010   Hyperlipidemia 06/18/2007   Hypertensive kidney disease 01/02/2007    REFERRING DIAG: U88.916X (ICD-10-CM) - Strain of quadriceps, right, initial encounter   THERAPY DIAG:  Difficulty in walking, not elsewhere classified - Plan: PT plan of care cert/re-cert  Muscle weakness (generalized) - Plan: PT plan of care cert/re-cert  Stiffness of right knee, not elsewhere classified - Plan: PT plan of care cert/re-cert  Acute pain of right knee - Plan: PT plan of care cert/re-cert  Rationale for Evaluation and Treatment Rehabilitation  PERTINENT HISTORY: Type 2 DM, renal insufficiency, previous stroke, HTN, HLD, L shoulder surgery  PRECAUTIONS: No longer in knee brace as of 02/09/2022  SUBJECTIVE: [name] [name] reports he continues to have less difficulty with ADLs around the house, including stairs.  His L groin pain is less frequent but still intermittent.  He also notes similar low back pain over the past 2 weeks, likely due to changes in his gait.  PAIN:  Are  you having pain? Yes: NPRS scale: 0-4/10 Pain location: Inferior patellar pole and suprapatellar, some LBP and L groin Pain description: Ache Aggravating factors: Prolonged sitting Relieving factors: Pain medication   OBJECTIVE: (objective measures completed at initial evaluation unless otherwise dated)  OBJECTIVE:    DIAGNOSTIC FINDINGS: High-grade quadriceps tendon tear primarily involving the vastus intermedius component with 1.6 cm retraction. There are few intact anterior, lateral, and medial  fibers.   Nondisplaced degenerative tearing of the posterior horn and body of the medial meniscus with substance loss.   Intrasubstance degenerative signal in the lateral meniscus with extension to the free edge in the lateral meniscal body, indicating a possible nondisplaced degenerative tear.   Tricompartment osteoarthritis, worst in the medial and patellofemoral compartments.   Large joint effusion.   PATIENT SURVEYS:  03/03/2022 FOTO 65 (Goal met) 01/27/2022 FOTO 62 (Goal met) 01/05/2022 FOTO 47 (Goal 62 in 13 visits)   COGNITION:           Overall cognitive status: Within functional limits for tasks assessed                          SENSATION: No complaints of peripheral pain or paresthesias   EDEMA:  Noted but not measured (mild)     LOWER EXTREMITY ROM:   Active ROM Right eval Left eval Right 01/27/2022 Right 02/16/2022 Right 03/03/2022  Hip flexion         Hip extension         Hip abduction         Hip adduction         Hip internal rotation         Hip external rotation         Knee flexion 113 130 118 120 125  Knee extension 1 0 0 0 1  Ankle dorsiflexion         Ankle plantarflexion         Ankle inversion         Ankle eversion          (Blank rows = not tested)   LOWER EXTREMITY MMT:   Thigh girth assessed 15 cm proximal to the superior patellar pole: R 50 cm and L 51.5 cm   Right eval Left eval Thigh girth R vs L assessed 15 cm proximal to the superior patellar pole  R 52.5 cm and L 53 cm 03/03/2022 Thigh girth 15 cm proximal to the superior patellar pole (L/R in cm) 53.5 cm/52.0  Hip flexion        Hip extension        Hip abduction        Hip adduction        Hip internal rotation        Hip external rotation        Knee flexion        Knee extension        Ankle dorsiflexion        Ankle plantarflexion        Ankle inversion        Ankle eversion         (Blank rows = not tested)   GAIT: Distance walked: In the office Assistive device  utilized:  Knee brace locked at 0-90 degrees Level of assistance: Complete Independence Comments: No mention of AROM limitations in eval.  Focus is on strength and not recommended to push AROM with a tear.  TODAY'S TREATMENT: 03/03/2022: Functional Activities for sit to stand and stairs: Step-down off 4 inch step 2 sets of 10X each slow eccentrics, avoid hyperextension, cues for posture (avoid trunk flexion), visual feedback in a mirror  Step-up and over 8 inch step 10X (Up R, down L)  Stairs (reciprocal) 4 flights in the office  Therapeutic Exercises: Lumbar extension AROM 10X 3 seconds Seated SLR 3 sets of 3 B (able to avoid quad lag on R with no hold)   02/16/2022: Functional Activities for sit to stand and stairs: Step-down off 4 inch step 2 sets of 10X each slow eccentrics, avoid hyperextension, cues for posture (avoid trunk flexion), visual feedback in a mirror Leg Press R leg only 43#  2 sets of 10X slow eccentrics  Therapeutic Exercises: Side lie clams with Blue theraband resistance 10X B 3 seconds 2 sets Lumbar extension AROM 10X 3 seconds Seated SLR 3X B (still quad lag on R) Knee extension machine 90-40 degrees with slow eccentrics 10X @ 10# Bridging 10X 5 seconds  Neuromuscular re-education: Tandem balance eyes open 3X each 20+ seconds   02/11/2022: Functional Activities for sit to stand and stairs: Step-down off 4 inch step 3 sets of 10X each slow eccentrics, avoid hyperextension, cues for posture (avoid trunk flexion) Leg Press R leg only 43#  2 sets of 10X slow eccentrics  Therapeutic Exercises: Side lie hip abduction 10X B 3 seconds (still needs lots of help from PT to avoid rolling back) Side lie clams with Blue theraband resistance 10X B 3 seconds 2 sets Lumbar extension AROM 10X 3 seconds Seated SLR 5X B (still quad lag on R)  Neuromuscular re-education: Tandem balance eyes open and dynamic tandem balance 4X each 20+ seconds    PATIENT EDUCATION:   Education details: See above Person educated: Patient and Spouse Education method: Explanation, Demonstration, Tactile cues, Verbal cues, and Handouts Education comprehension: verbalized understanding, returned demonstration, verbal cues required, tactile cues required, and needs further education     HOME EXERCISE PROGRAM: Access Code: [email or web address] URL: [email or web address] Date: 03/03/2022 Prepared by: Vista Mink  Exercises - Supine Quadricep Sets  - 3-5 x daily - 7 x weekly - 2-3 sets - 10 reps - 5 second hold - Small Range Straight Leg Raise  - 3 x daily - 7 x weekly - 1 sets - 3 reps - 3 seconds hold - Tandem Stance  - 2 x daily - 3 x weekly - 1 sets - 5 reps - 20 second hold - Sit to Stand with Armchair  - 2 x daily - 3 x weekly - 1 sets - 5 reps - Standing Lumbar Extension at [place]  - 5 x daily - 7 x weekly - 1 sets - 5 reps - 3 seconds hold - Clamshell  - 2 x daily - 3 x weekly - 2 sets - 10 reps - 3 seconds hold - Yoga Bridge  - 1 x daily - 3 x weekly - 1 sets - 10 reps - 5 seconds hold - Lateral Step Down  - 1 x daily - 3 x weekly - 3 sets - 10 reps   ASSESSMENT:   CLINICAL IMPRESSION: [name] [name] is happy with his functional PT progress.  Subjectively, he rates himself "about 80%" as compared to where he would like to be.  [name] [name] is independent with his HEP and was given the option of continued PT vs independent rehabilitation.  He would like to follow-up with Dr. Erlinda Hong to decide and  will let me know after his appointment with Dr. Erlinda Hong 03/11/22.   OBJECTIVE IMPAIRMENTS Abnormal gait, decreased activity tolerance, decreased endurance, difficulty walking, decreased ROM, decreased strength, increased edema, impaired perceived functional ability, impaired flexibility, and pain.    ACTIVITY LIMITATIONS carrying, lifting, bending, standing, squatting, stairs, and locomotion level   PARTICIPATION LIMITATIONS: driving, community activity, and going to the gym    PERSONAL FACTORS Type 2 DM, renal insufficiency, previous stroke, HTN, HLD, previous L shoulder surgery are also affecting patient's functional outcome.    REHAB POTENTIAL: Good   CLINICAL DECISION MAKING: Stable/uncomplicated   EVALUATION COMPLEXITY: Low     GOALS: Goals reviewed with patient? Yes   SHORT TERM GOALS: Target date: 02/02/2022  Improve R quadriceps strength as assessed by his ability to complete a set of 5 seated straight leg raises without quadriceps lag Baseline: Quadriceps lag evident on rep 1 Goal status: Better but still On Going 03/03/2022   2.  [name] [name] will be independent with his day 1 HEP Baseline: Started 01/05/2022 Goal status: Met 01/27/2022   3.  [name] [name] will not need pain medication for his R knee Baseline: Prescription pain meds PRN Goal status: Met 03/03/2022     LONG TERM GOALS: Target date: 03/02/2022    Improve FOTO to 62 in 13 visits Baseline: 47 at visit 1 Goal status: Met 01/27/2022   2.  Improve R thigh/knee pain to consistently 0-2/10 on the Numeric Pain Rating Scale Baseline: 0-5/10 Goal status: On Going 03/03/2022   3.  Improve R knee AROM for flexion to pain-free 130 degrees Baseline: 113 degrees with pain at end range Goal status: On Going (125) 03/03/2022   4.  Improve R quadriceps strength as assessed by improved thigh girth assessed 15 cm proximal to the superior patellar pole Baseline: 50 cm Goal status: On Going, improved 03/03/2022   5.  [name] [name] will be independent with his long-term HEP at [place] Baseline: Started 01/05/2022 Goal status: On Going 03/03/2022   PLAN: PT FREQUENCY: 1-2x/week   PT DURATION: 4 weeks   PLANNED INTERVENTIONS: Therapeutic exercises, Therapeutic activity, Neuromuscular re-education, Balance training, Gait training, Patient/Family education, Stair training, Electrical stimulation, Cryotherapy, Vasopneumatic device, and BFR   PLAN FOR NEXT SESSION: [name] [name] will check by after his appointment with Dr. Erlinda Hong and decide  if he is comfortable with his HEP and ready to be independent or if he would like another 4 weeks of supervised strength work.      Farley Ly, PT, MPT 03/03/2022, 2:02 PM

## 2022-03-11 ENCOUNTER — Encounter: Payer: Self-pay | Admitting: Orthopaedic Surgery

## 2022-03-11 ENCOUNTER — Ambulatory Visit: Payer: Medicare Other | Admitting: Orthopaedic Surgery

## 2022-03-11 DIAGNOSIS — S76111A Strain of right quadriceps muscle, fascia and tendon, initial encounter: Secondary | ICD-10-CM | POA: Diagnosis not present

## 2022-03-11 NOTE — Progress Notes (Signed)
Patient: [name] [name]           Date of Birth: 1945-11-01           MRN: [id number] Visit Date: 03/11/2022 PCP: [name] Peng, NP   Assessment & Plan:  Chief Complaint:  Chief Complaint  Patient presents with   Right Knee - Follow-up    Quadriceps tear   Visit Diagnoses:  1. Strain of quadriceps, right, initial encounter     Plan: [name] [name] is following up today approximately 15 to 16 weeks status post partial right quadriceps tear.  He is doing well at physical therapy.  No real complaints.  Physical exam shows excellent range of motion.  Slight tenderness to the superior aspect of the patella and the distal quadriceps.  He has excellent strength.  From my standpoint [name] [name] can discontinue outpatient PT and go to home exercises.  He will continue to be careful with activities to prevent reinjury.  We will see him back as needed.  Follow-Up Instructions: No follow-ups on file.   Orders:  No orders of the defined types were placed in this encounter.  No orders of the defined types were placed in this encounter.   Imaging: No results found.  PMFS History: Patient Active Problem List   Diagnosis Date Noted   Strain of quadriceps, right, initial encounter 12/10/2021   Acute pain of right knee 11/24/2021   Drug-induced myopathy 01/08/2020   Iron deficiency anemia 04/03/2019   Anemia of chronic renal failure, stage 4 (severe) ([place]) 04/03/2019   Late effect of cerebrovascular accident (CVA) 12/07/2017   Hypoglycemia    Uncontrolled hypertension    Hypokalemia    Acute ischemic left MCA stroke ([place]) 07/12/2017   Diabetes mellitus type 2 in obese [facility])    Stage 3 chronic kidney disease ([place])    Left middle cerebral artery stroke ([place]) 83/38/2505   Acute embolic stroke ([place])    Aphasia 07/08/2017   Gout 07/21/2011   Prostatitis, chronic 07/14/2010   Hyperlipidemia 06/18/2007   Hypertensive kidney disease 01/02/2007   Past Medical History:  Diagnosis Date    Anemia    Cataract    DIABETES MELLITUS, TYPE II 01/02/2007   HYPERLIPIDEMIA 06/18/2007   HYPERTENSION 01/02/2007   RENAL INSUFFICIENCY 02/05/2008   Stroke ([place]) 2019    Family History  Problem Relation Age of Onset   Hypertension Mother    Diabetes Mother    Stroke Father    Stroke Maternal Grandmother    Colon cancer Neg Hx    Esophageal cancer Neg Hx    Rectal cancer Neg Hx    Stomach cancer Neg Hx     Past Surgical History:  Procedure Laterality Date   LOOP RECORDER INSERTION N/A 07/11/2017   Procedure: LOOP RECORDER INSERTION;  Surgeon: Thompson Grayer, MD;  Location: [place] [facility];  Service: Cardiovascular;  Laterality: N/A;   SHOULDER SURGERY     left   TEE WITHOUT CARDIOVERSION N/A 07/11/2017   Procedure: TRANSESOPHAGEAL ECHOCARDIOGRAM (TEE);  Surgeon: Dorothy Spark, MD;  Location: [facility] ENDOSCOPY;  Service: Cardiovascular;  Laterality: N/A;   TRANSURETHRAL RESECTION OF PROSTATE     history of retention/hematuria    Social History   Occupational History   Not on file  Tobacco Use   Smoking status: Never   Smokeless tobacco: Never  Vaping Use   Vaping Use: Never used  Substance and Sexual Activity   Alcohol use: No   Drug use: No  Sexual activity: Not on file

## 2022-03-15 ENCOUNTER — Encounter: Payer: Self-pay | Admitting: Pharmacist

## 2022-03-15 DIAGNOSIS — G720 Drug-induced myopathy: Secondary | ICD-10-CM

## 2022-03-15 NOTE — Progress Notes (Signed)
[facility])                                            [place] Team                                        Statin Quality Measure Assessment    03/15/2022  [name] [name] 25-Jul-1945 [id number]   Per review of chart and payor information, patient has a diagnosis of diabetes but is not currently filling a statin prescription.  This places patient into the SUPD (Statin Use In Patients with Diabetes) measure for CMS. He has documented statin intolerance and is taking ezetimibe.    Patient has documented allergy to statin but no corresponding CPT codes that would exclude patient from SUPD measure.   If deemed therapeutically appropriate, a statin exclusion code could be associated with the upcoming visit on 03/16/22.  Associating a statin exclusion code would remove the patient from the measure for 2023.  M79.10 was associated with a visit earlier this year but that code will only remove patients in the [facility] measure.  If deemed therapeutically appropriate, an "approved" statin exclusion code could be associated with the upcoming visit.    The ASCVD Risk score (Arnett DK, et al., 2019) failed to calculate for the following reasons:   The patient has a prior MI or stroke diagnosis 01/08/2021     Component Value Date/Time   CHOL 193 01/08/2021 0818   TRIG 115.0 01/08/2021 0818   TRIG 323 (HH) 06/13/2006 1050   HDL 44.10 01/08/2021 0818   CHOLHDL 4 01/08/2021 0818   VLDL 23.0 01/08/2021 0818   LDLCALC 125 (H) 01/08/2021 0818   LDLCALC 98 01/15/2020 0811   LDLDIRECT 180.7 06/24/2013 1106    Please consider ONE of the following recommendations:  Initiate high intensity statin Atorvastatin '40mg'$  once daily, #90, 3 refills   Rosuvastatin '20mg'$  once daily, #90, 3 refills    Initiate moderate intensity          statin with reduced frequency if prior          statin intolerance 1x weekly, #13, 3 refills   2x weekly, #26, 3  refills   3x weekly, #39, 3 refills    Code for past statin intolerance or  other exclusions (required annually)   Provider Requirements:   Associate code during an office visit or telehealth encounter  Drug Induced Myopathy G72.0   Myopathy, unspecified G72.9   Myositis, unspecified M60.9   Rhabdomyolysis W38.88   Alcoholic fatty liver K80.0   Cirrhosis of liver K74.69   Prediabetes R73.03   PCOS E28.2   Toxic liver disease, unspecified K71.9        Plan: Route note to PCP prior to upcoming appointment.   Elayne Guerin, PharmD, [place] Clinical Pharmacist [phone]

## 2022-03-16 ENCOUNTER — Encounter: Payer: Self-pay | Admitting: Adult Health

## 2022-03-16 ENCOUNTER — Ambulatory Visit (INDEPENDENT_AMBULATORY_CARE_PROVIDER_SITE_OTHER): Payer: Medicare Other | Admitting: Adult Health

## 2022-03-16 VITALS — BP 140/70 | HR 62 | Temp 98.8°F | Ht 74.0 in | Wt 225.0 lb

## 2022-03-16 DIAGNOSIS — D509 Iron deficiency anemia, unspecified: Secondary | ICD-10-CM

## 2022-03-16 DIAGNOSIS — T466X5A Adverse effect of antihyperlipidemic and antiarteriosclerotic drugs, initial encounter: Secondary | ICD-10-CM | POA: Diagnosis not present

## 2022-03-16 DIAGNOSIS — Z0000 Encounter for general adult medical examination without abnormal findings: Secondary | ICD-10-CM | POA: Diagnosis not present

## 2022-03-16 DIAGNOSIS — Z125 Encounter for screening for malignant neoplasm of prostate: Secondary | ICD-10-CM | POA: Diagnosis not present

## 2022-03-16 DIAGNOSIS — I739 Peripheral vascular disease, unspecified: Secondary | ICD-10-CM | POA: Diagnosis not present

## 2022-03-16 DIAGNOSIS — M791 Myalgia, unspecified site: Secondary | ICD-10-CM

## 2022-03-16 DIAGNOSIS — I63512 Cerebral infarction due to unspecified occlusion or stenosis of left middle cerebral artery: Secondary | ICD-10-CM | POA: Diagnosis not present

## 2022-03-16 DIAGNOSIS — I129 Hypertensive chronic kidney disease with stage 1 through stage 4 chronic kidney disease, or unspecified chronic kidney disease: Secondary | ICD-10-CM | POA: Diagnosis not present

## 2022-03-16 DIAGNOSIS — E1122 Type 2 diabetes mellitus with diabetic chronic kidney disease: Secondary | ICD-10-CM

## 2022-03-16 DIAGNOSIS — N184 Chronic kidney disease, stage 4 (severe): Secondary | ICD-10-CM

## 2022-03-16 DIAGNOSIS — Z794 Long term (current) use of insulin: Secondary | ICD-10-CM

## 2022-03-16 DIAGNOSIS — E782 Mixed hyperlipidemia: Secondary | ICD-10-CM

## 2022-03-16 LAB — COMPREHENSIVE METABOLIC PANEL
ALT: 11 U/L (ref 0–53)
AST: 11 U/L (ref 0–37)
Albumin: 4 g/dL (ref 3.5–5.2)
Alkaline Phosphatase: 49 U/L (ref 39–117)
BUN: 43 mg/dL — ABNORMAL HIGH (ref 6–23)
CO2: 24 mEq/L (ref 19–32)
Calcium: 9.3 mg/dL (ref 8.4–10.5)
Chloride: 107 mEq/L (ref 96–112)
Creatinine, Ser: 2.86 mg/dL — ABNORMAL HIGH (ref 0.40–1.50)
GFR: 20.89 mL/min — ABNORMAL LOW (ref >60.00)
Glucose, Bld: 136 mg/dL — ABNORMAL HIGH (ref 70–99)
Potassium: 4.4 mEq/L (ref 3.5–5.1)
Sodium: 140 mEq/L (ref 135–145)
Total Bilirubin: 0.5 mg/dL (ref 0.2–1.2)
Total Protein: 6.9 g/dL (ref 6.0–8.3)

## 2022-03-16 LAB — CBC WITH DIFFERENTIAL/PLATELET
Basophils Absolute: 0.1 10*3/uL (ref 0.0–0.1)
Basophils Relative: 0.8 % (ref 0.0–3.0)
Eosinophils Absolute: 0.7 10*3/uL (ref 0.0–0.7)
Eosinophils Relative: 9.4 % — ABNORMAL HIGH (ref 0.0–5.0)
HCT: 34.3 % — ABNORMAL LOW (ref 39.0–52.0)
Hemoglobin: 11.4 g/dL — ABNORMAL LOW (ref 13.0–17.0)
Lymphocytes Relative: 25 % (ref 12.0–46.0)
Lymphs Abs: 2 10*3/uL (ref 0.7–4.0)
MCHC: 33.1 g/dL (ref 30.0–36.0)
MCV: 84.6 fl (ref 78.0–100.0)
Monocytes Absolute: 0.5 10*3/uL (ref 0.1–1.0)
Monocytes Relative: 6 % (ref 3.0–12.0)
Neutro Abs: 4.6 10*3/uL (ref 1.4–7.7)
Neutrophils Relative %: 58.8 % (ref 43.0–77.0)
Platelets: 230 10*3/uL (ref 150.0–400.0)
RBC: 4.05 Mil/uL — ABNORMAL LOW (ref 4.22–5.81)
RDW: 14.6 % (ref 11.5–15.5)
WBC: 7.8 10*3/uL (ref 4.0–10.5)

## 2022-03-16 LAB — IBC + FERRITIN
Ferritin: 70.4 ng/mL (ref 22.0–322.0)
Iron: 82 ug/dL (ref 42–165)
Saturation Ratios: 31 % (ref 20.0–50.0)
TIBC: 264.6 ug/dL (ref 250.0–450.0)
Transferrin: 189 mg/dL — ABNORMAL LOW (ref 212.0–360.0)

## 2022-03-16 LAB — VITAMIN B12: Vitamin B-12: 550 pg/mL (ref 211–911)

## 2022-03-16 LAB — LIPID PANEL
Cholesterol: 181 mg/dL (ref 0–200)
HDL: 41.6 mg/dL (ref >39.00)
LDL Cholesterol: 113 mg/dL — ABNORMAL HIGH (ref 0–99)
NonHDL: 139.62
Total CHOL/HDL Ratio: 4
Triglycerides: 135 mg/dL (ref 0.0–149.0)
VLDL: 27 mg/dL (ref 0.0–40.0)

## 2022-03-16 LAB — HEMOGLOBIN A1C: Hgb A1c MFr Bld: 7 % — ABNORMAL HIGH (ref 4.6–6.5)

## 2022-03-16 LAB — TSH: TSH: 2.86 u[IU]/mL (ref 0.35–5.50)

## 2022-03-16 LAB — PSA: PSA: 0.78 ng/mL (ref 0.10–4.00)

## 2022-03-16 MED ORDER — BD PEN NEEDLE MINI U/F 31G X 5 MM MISC: 1.0000 | Freq: Two times a day (BID) | 3 refills | Status: AC

## 2022-03-16 NOTE — Patient Instructions (Signed)
It was great seeing you today   We will follow up with you regarding your lab work   Please let me know if you need anything   

## 2022-03-16 NOTE — Progress Notes (Signed)
Subjective:    Patient ID: [name] [name], male    DOB: 10-31-1945, 76 y.o.   MRN: [id number]  HPI Patient presents for yearly preventative medicine examination. He is a pleasant 76 year old male who  has a past medical history of Anemia, Cataract, DIABETES MELLITUS, TYPE II (01/02/2007), HYPERLIPIDEMIA (06/18/2007), HYPERTENSION (01/02/2007), RENAL INSUFFICIENCY (02/05/2008), and Stroke (HCC) (2019).  Diabetes mellitus type 2-managed with Starlix 60 mg daily and Lantus 9 units in the morning and 3 units in the evening.  He does monitor his blood sugars at home and reports is in the 80s to 200s.  He denies episodes of hypoglycemia. He reports that his blood sugars are mostly in the 120's to 130's.   Hypertension with chronic kidney disease stage 4-managed by nephrology.  Currently prescribed hydralazine 50 mg 3 times daily, Norvasc 5 mg daily, valsartan 160 mg daily, labetalol 200 mg 3 times daily. His blood pressures are usually in the 120'-130's.   Hyperlipidemia-intolerant to statins due to myalgia.  He does take Zetia 10 mg daily without any side effects Lab Results  Component Value Date   CHOL 193 01/08/2021   HDL 44.10 01/08/2021   LDLCALC 125 (H) 01/08/2021   LDLDIRECT 180.7 06/24/2013   TRIG 115.0 01/08/2021   CHOLHDL 4 01/08/2021   Deficiency anemia-managed by hematology every 6 months.  He has received IV iron infusions in the past.  Currently taking OTC iron B12 supplementation Lab Results  Component Value Date   IRON 74 08/07/2020   TIBC 255 08/07/2020   FERRITIN 152 08/07/2020   Lab Results  Component Value Date   VITAMINB12 459 08/07/2020    History of left middle cerebral stroke-followed by neurology every 6 months.  Has no residual deficit.  He does have a loop recorder in aspirin 325 mg daily and Zetia for secondary stroke prevention  PVD- is managed by vein and vascular - is seen yearly now.   All immunizations and health maintenance protocols were reviewed  with the patient and needed orders were placed.  Appropriate screening laboratory values were ordered for the patient including screening of hyperlipidemia, renal function and hepatic function. If indicated by BPH, a PSA was ordered.  Medication reconciliation,  past medical history, social history, problem list and allergies were reviewed in detail with the patient  Goals were established with regard to weight loss, exercise, and  diet in compliance with medications. He plans to get back into the gym and exercise more often  Wt Readings from Last 3 Encounters:  03/16/22 225 lb (102.1 kg)  12/31/21 227 lb (103 kg)  12/29/21 227 lb 2 oz (103 kg)    Review of Systems  HENT: Negative.    Eyes: Negative.   Respiratory: Negative.    Cardiovascular: Negative.   Gastrointestinal: Negative.   Endocrine: Negative.   Genitourinary: Negative.   Musculoskeletal:  Positive for arthralgias and back pain.  Skin: Negative.   Allergic/Immunologic: Negative.   Neurological: Negative.   Hematological: Negative.   Psychiatric/Behavioral: Negative.     Past Medical History:  Diagnosis Date   Anemia    Cataract    DIABETES MELLITUS, TYPE II 01/02/2007   HYPERLIPIDEMIA 06/18/2007   HYPERTENSION 01/02/2007   RENAL INSUFFICIENCY 02/05/2008   Stroke [facility]) 2019    Social History   Socioeconomic History   Marital status: Married    Spouse name: Not on file   Number of children: Not on file   Years of education: Not on file  Highest education level: Not on file  Occupational History   Not on file  Tobacco Use   Smoking status: Never   Smokeless tobacco: Never  Vaping Use   Vaping Use: Never used  Substance and Sexual Activity   Alcohol use: No   Drug use: No   Sexual activity: Not on file  Other Topics Concern   Not on file  Social History Narrative   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 Likes to go to football games.    Social Determinants of Health   Financial Resource Strain: Low Risk  (02/14/2022)   Overall Financial Resource Strain (CARDIA)    Difficulty of Paying Living Expenses: Not hard at all  Food Insecurity: No Food Insecurity (08/16/2021)   Hunger Vital Sign    Worried About Running Out of Food in the Last Year: Never true    Ran Out of Food in the Last Year: Never true  Transportation Needs: No Transportation Needs (08/16/2021)   PRAPARE - Hydrologist (Medical): No    Lack of Transportation (Non-Medical): No  Physical Activity: Inactive (08/16/2021)   Exercise Vital Sign    Days of Exercise per Week: 0 days    Minutes of Exercise per Session: 0 min  Stress: No Stress Concern Present (08/16/2021)   [place]    Feeling of Stress : Only a little  Social Connections: Socially Integrated (08/16/2021)   Social Connection and Isolation Panel [NHANES]    Frequency of Communication with Friends and Family: More than three times a week    Frequency of Social Gatherings with Friends and Family: More than three times a week    Attends Religious Services: More than 4 times per year    Active Member of Clubs or Organizations: Yes    Attends Archivist Meetings: More than 4 times per year    Marital Status: Married  Human resources officer Violence: Not At Risk (08/16/2021)   Humiliation, Afraid, Rape, and Kick questionnaire    Fear of Current or Ex-Partner: No    Emotionally Abused: No    Physically Abused: No    Sexually Abused: No    Past Surgical History:  Procedure Laterality Date   LOOP RECORDER INSERTION N/A 07/11/2017   Procedure: LOOP RECORDER INSERTION;  Surgeon: Thompson Grayer, MD;  Location: [facility];  Service: Cardiovascular;  Laterality: N/A;   SHOULDER SURGERY     left   TEE WITHOUT CARDIOVERSION N/A 07/11/2017   Procedure:  TRANSESOPHAGEAL ECHOCARDIOGRAM (TEE);  Surgeon: Dorothy Spark, MD;  Location: [facility] ENDOSCOPY;  Service: Cardiovascular;  Laterality: N/A;   TRANSURETHRAL RESECTION OF PROSTATE     history of retention/hematuria     Family History  Problem Relation Age of Onset   Hypertension Mother    Diabetes Mother    Stroke Father    Stroke Maternal Grandmother    Colon cancer Neg Hx    Esophageal cancer Neg Hx    Rectal cancer Neg Hx    Stomach cancer Neg Hx     Allergies  Allergen Reactions   Evolocumab Hypertension    Other reaction(s): Other (see comments)   5-Alpha Reductase Inhibitors Other (See Comments)    myalgia Other reaction(s): Other (see comments)   Bactrim [  Sulfamethoxazole-Trimethoprim] Hives, Itching and Swelling   Penicillins Hives    Has patient had a PCN reaction causing immediate rash, facial/tongue/throat swelling, SOB or lightheadedness with hypotension: Unk Has patient had a PCN reaction causing severe rash involving mucus membranes or skin necrosis: Unk Has patient had a PCN reaction that required hospitalization: Unk Has patient had a PCN reaction occurring within the last 10 years: No If all of the above answers are "NO", then may proceed with Cephalosporin use. Other reaction(s): Other (see comments)   Statins     myalgia Other reaction(s): Other (see comments)   Sulfa Antibiotics     Other reaction(s): Other (see comments)   Sulfa Drugs Cross Reactors Hives, Itching and Swelling   Sulfamethoxazole-Trimethoprim Hives and Itching    Other reaction(s): Other (see comments)    Current Outpatient Medications on File Prior to Visit  Medication Sig Dispense Refill   amLODipine (NORVASC) 5 MG tablet Take 5 mg by mouth in the morning and at bedtime.     aspirin 325 MG tablet Take 325 mg by mouth daily.     B Complex Vitamins (VITAMIN B COMPLEX) TABS Take by mouth.     Blood Glucose Monitoring Suppl (ONETOUCH VERIO) w/Device KIT Use with existing test strips 1  kit kit   brimonidine (ALPHAGAN) 0.2 % ophthalmic solution Place 1 drop into both eyes 2 (two) times daily.     brimonidine-timolol (COMBIGAN) 0.2-0.5 % ophthalmic solution Apply to eye.     calcitRIOL (ROCALTROL) 0.25 MCG capsule Take 1 capsule by mouth every other day.     Cholecalciferol 50 MCG (2000 UT) CAPS Take 1 capsule by mouth daily.     ezetimibe (ZETIA) 10 MG tablet TAKE 1 TABLET(10 MG) BY MOUTH DAILY 90 tablet 3   ferrous sulfate 325 (65 FE) MG tablet Take 325 mg by mouth 2 (two) times daily with a meal.     hydrALAZINE (APRESOLINE) 100 MG tablet Take 100 mg by mouth 3 (three) times daily.     insulin glargine (LANTUS SOLOSTAR) 100 UNIT/ML Solostar Pen Inject 9 Units into the skin 2 (two) times daily. 15 mL 0   labetalol (NORMODYNE) 200 MG tablet TAKE 1 TABLET(200 MG) BY MOUTH THREE TIMES DAILY 270 tablet 3   latanoprost (XALATAN) 0.005 % ophthalmic solution 1 drop at bedtime.     nateglinide (STARLIX) 60 MG tablet TAKE 1 TABLET BY MOUTH ONCE A DAY WITH MEALS     ONETOUCH VERIO test strip USE TO TEST TWICE DAILY AS DIRECTED 200 strip 2   timolol (TIMOPTIC) 0.5 % ophthalmic solution 1 drop 2 (two) times daily.     traMADol (ULTRAM) 50 MG tablet Take 1-2 tablets (50-100 mg total) by mouth daily as needed. 20 tablet 0   valsartan (DIOVAN) 160 MG tablet Take 80 mg by mouth 2 (two) times daily.     No current facility-administered medications on file prior to visit.    BP (!) 140/70   Pulse 62   Temp 98.8 F (37.1 C) (Oral)   Ht $R'6\' 2"'Ol$  (1.88 m)   Wt 225 lb (102.1 kg)   SpO2 98%   BMI 28.89 kg/m       Objective:   Physical Exam Vitals and nursing note reviewed.  Constitutional:      General: He is not in acute distress.    Appearance: Normal appearance. He is well-developed and normal weight.  HENT:     Head: Normocephalic and atraumatic.     Right Ear:  Tympanic membrane, ear canal and external ear normal. There is no impacted cerumen.     Left Ear: Tympanic membrane,  ear canal and external ear normal. There is no impacted cerumen.     Nose: Nose normal. No congestion or rhinorrhea.     Mouth/Throat:     Mouth: Mucous membranes are moist.     Pharynx: Oropharynx is clear. No oropharyngeal exudate or posterior oropharyngeal erythema.  Eyes:     General:        Right eye: No discharge.        Left eye: No discharge.     Extraocular Movements: Extraocular movements intact.     Conjunctiva/sclera: Conjunctivae normal.     Pupils: Pupils are equal, round, and reactive to light.  Neck:     Vascular: No carotid bruit.     Trachea: No tracheal deviation.  Cardiovascular:     Rate and Rhythm: Normal rate and regular rhythm.     Pulses: Normal pulses.     Heart sounds: Normal heart sounds. No murmur heard.    No friction rub. No gallop.  Pulmonary:     Effort: Pulmonary effort is normal. No respiratory distress.     Breath sounds: Normal breath sounds. No stridor. No wheezing, rhonchi or rales.  Chest:     Chest wall: No tenderness.  Abdominal:     General: Bowel sounds are normal. There is no distension.     Palpations: Abdomen is soft. There is no mass.     Tenderness: There is no abdominal tenderness. There is no right CVA tenderness, left CVA tenderness, guarding or rebound.     Hernia: No hernia is present.  Musculoskeletal:        General: No swelling, tenderness, deformity or signs of injury. Normal range of motion.     Right lower leg: No edema.     Left lower leg: No edema.  Lymphadenopathy:     Cervical: No cervical adenopathy.  Skin:    General: Skin is warm and dry.     Capillary Refill: Capillary refill takes less than 2 seconds.     Coloration: Skin is not jaundiced or pale.     Findings: No bruising, erythema, lesion or rash.  Neurological:     General: No focal deficit present.     Mental Status: He is alert and oriented to person, place, and time.     Cranial Nerves: No cranial nerve deficit.     Sensory: No sensory deficit.      Motor: No weakness.     Coordination: Coordination normal.     Gait: Gait normal.     Deep Tendon Reflexes: Reflexes normal.  Psychiatric:        Mood and Affect: Mood normal.        Behavior: Behavior normal.        Thought Content: Thought content normal.        Judgment: Judgment normal.       Assessment & Plan:   1. Routine general medical examination at a health care facility - Follow up one year or sooner if needed - Continue to exercise and eat healthy  - Follow up in one year or sooner if needed - CBC with Differential/Platelet; Future - Comprehensive metabolic panel; Future - Hemoglobin A1c; Future - Lipid panel; Future - TSH; Future - Vitamin B12; Future - IBC + Ferritin; Future  2. Type 2 diabetes mellitus with stage 4 chronic kidney disease, with long-term current use  of insulin ([place]) - Consider increase in insuline therapy - 3 month follow up  - CBC with Differential/Platelet; Future - Comprehensive metabolic panel; Future - Hemoglobin A1c; Future - Lipid panel; Future - TSH; Future - Vitamin B12; Future - IBC + Ferritin; Future - Insulin Pen Needle (B-D UF III MINI PEN NEEDLES) 31G X 5 MM MISC; Inject 1 Needle into the skin in the morning and at bedtime. USE TO TEST BLOOD GLUCOSE FOUR TIMES DAILY  Dispense: 200 each; Refill: 3  3. Hypertensive kidney disease - Per nephrology  - CBC with Differential/Platelet; Future - Comprehensive metabolic panel; Future - Hemoglobin A1c; Future - Lipid panel; Future - TSH; Future - Vitamin B12; Future - IBC + Ferritin; Future  4. Myalgia due to statin - Continue with zetia   5. PVD (peripheral vascular disease) ([place]) - Per vascular surgery  - CBC with Differential/Platelet; Future - Comprehensive metabolic panel; Future - Hemoglobin A1c; Future - Lipid panel; Future - TSH; Future - Vitamin B12; Future - IBC + Ferritin; Future  6. Mixed hyperlipidemia - Continue with zetia  - CBC with  Differential/Platelet; Future - Comprehensive metabolic panel; Future - Hemoglobin A1c; Future - Lipid panel; Future - TSH; Future - Vitamin B12; Future - IBC + Ferritin; Future  7. Iron deficiency anemia, unspecified iron deficiency anemia type - Continue with OTC iron and B12 - CBC with Differential/Platelet; Future - Comprehensive metabolic panel; Future - Hemoglobin A1c; Future - Lipid panel; Future - TSH; Future - Vitamin B12; Future - IBC + Ferritin; Future  8. Left middle cerebral artery stroke ([place]) - Follow up with neurology as directed - CBC with Differential/Platelet; Future - Comprehensive metabolic panel; Future - Hemoglobin A1c; Future - Lipid panel; Future - TSH; Future - Vitamin B12; Future - IBC + Ferritin; Future  9. Prostate cancer screening  - PSA; Future  Dorothyann Peng, NP

## 2022-03-23 ENCOUNTER — Encounter: Payer: Self-pay | Admitting: *Deleted

## 2022-03-23 NOTE — Progress Notes (Signed)
[facility] Quality Team Note  Name: PEDROHENRIQUE MCCONVILLE Date of Birth: 04/04/1946 MRN: [id number] Date: 03/23/2022  [facility] Quality Team has reviewed this patient's chart, please see recommendations below:  [facility] Quality Other; (Pt has open gap for KED measure.  Had eGFR and creatinine but did not have albumin.  Can you see if provider can order urine albumin creatinine ratio test to help close gap?)

## 2022-04-06 DIAGNOSIS — H2513 Age-related nuclear cataract, bilateral: Secondary | ICD-10-CM | POA: Diagnosis not present

## 2022-04-06 DIAGNOSIS — H1045 Other chronic allergic conjunctivitis: Secondary | ICD-10-CM | POA: Diagnosis not present

## 2022-04-06 DIAGNOSIS — H11123 Conjunctival concretions, bilateral: Secondary | ICD-10-CM | POA: Diagnosis not present

## 2022-04-06 DIAGNOSIS — H40023 Open angle with borderline findings, high risk, bilateral: Secondary | ICD-10-CM | POA: Diagnosis not present

## 2022-04-15 ENCOUNTER — Other Ambulatory Visit: Payer: Self-pay | Admitting: Physician Assistant

## 2022-04-15 ENCOUNTER — Telehealth: Payer: Self-pay | Admitting: Orthopaedic Surgery

## 2022-04-15 MED ORDER — TRAMADOL HCL 50 MG PO TABS: 50.0000 mg | ORAL_TABLET | Freq: Every day | ORAL | 0 refills | Status: DC | PRN

## 2022-04-15 NOTE — Telephone Encounter (Signed)
Pt wants to know if he could get a  refill on tramodal  '50mg'$ .  Please call  wife [phone] if meds can be filled.

## 2022-04-15 NOTE — Telephone Encounter (Signed)
Notified patient's wife

## 2022-05-10 ENCOUNTER — Other Ambulatory Visit: Payer: Self-pay | Admitting: Adult Health

## 2022-05-20 ENCOUNTER — Other Ambulatory Visit: Payer: Self-pay | Admitting: Adult Health

## 2022-05-20 DIAGNOSIS — E119 Type 2 diabetes mellitus without complications: Secondary | ICD-10-CM

## 2022-06-03 ENCOUNTER — Ambulatory Visit (INDEPENDENT_AMBULATORY_CARE_PROVIDER_SITE_OTHER): Payer: Medicare Other

## 2022-06-03 ENCOUNTER — Ambulatory Visit (INDEPENDENT_AMBULATORY_CARE_PROVIDER_SITE_OTHER): Payer: Medicare Other | Admitting: Adult Health

## 2022-06-03 VITALS — BP 150/90 | HR 65 | Temp 97.2°F | Ht 74.0 in | Wt 225.0 lb

## 2022-06-03 DIAGNOSIS — I129 Hypertensive chronic kidney disease with stage 1 through stage 4 chronic kidney disease, or unspecified chronic kidney disease: Secondary | ICD-10-CM | POA: Diagnosis not present

## 2022-06-03 DIAGNOSIS — N184 Chronic kidney disease, stage 4 (severe): Secondary | ICD-10-CM

## 2022-06-03 DIAGNOSIS — R0600 Dyspnea, unspecified: Secondary | ICD-10-CM | POA: Diagnosis not present

## 2022-06-03 DIAGNOSIS — Z794 Long term (current) use of insulin: Secondary | ICD-10-CM

## 2022-06-03 DIAGNOSIS — E1122 Type 2 diabetes mellitus with diabetic chronic kidney disease: Secondary | ICD-10-CM | POA: Diagnosis not present

## 2022-06-03 DIAGNOSIS — R0609 Other forms of dyspnea: Secondary | ICD-10-CM

## 2022-06-03 LAB — CBC WITH DIFFERENTIAL/PLATELET
Basophils Absolute: 0.1 10*3/uL (ref 0.0–0.1)
Basophils Relative: 0.7 % (ref 0.0–3.0)
Eosinophils Absolute: 0.7 10*3/uL (ref 0.0–0.7)
Eosinophils Relative: 8.1 % — ABNORMAL HIGH (ref 0.0–5.0)
HCT: 35.6 % — ABNORMAL LOW (ref 39.0–52.0)
Hemoglobin: 11.8 g/dL — ABNORMAL LOW (ref 13.0–17.0)
Lymphocytes Relative: 25 % (ref 12.0–46.0)
Lymphs Abs: 2.3 10*3/uL (ref 0.7–4.0)
MCHC: 33.2 g/dL (ref 30.0–36.0)
MCV: 83.8 fl (ref 78.0–100.0)
Monocytes Absolute: 0.7 10*3/uL (ref 0.1–1.0)
Monocytes Relative: 7.8 % (ref 3.0–12.0)
Neutro Abs: 5.3 10*3/uL (ref 1.4–7.7)
Neutrophils Relative %: 58.4 % (ref 43.0–77.0)
Platelets: 270 10*3/uL (ref 150.0–400.0)
RBC: 4.24 Mil/uL (ref 4.22–5.81)
RDW: 14.3 % (ref 11.5–15.5)
WBC: 9.1 10*3/uL (ref 4.0–10.5)

## 2022-06-03 LAB — MICROALBUMIN / CREATININE URINE RATIO
Creatinine,U: 116.2 mg/dL
Microalb Creat Ratio: 9 mg/g (ref 0.0–30.0)
Microalb, Ur: 10.4 mg/dL — ABNORMAL HIGH (ref 0.0–1.9)

## 2022-06-03 LAB — BASIC METABOLIC PANEL
BUN: 43 mg/dL — ABNORMAL HIGH (ref 6–23)
CO2: 26 mEq/L (ref 19–32)
Calcium: 9.3 mg/dL (ref 8.4–10.5)
Chloride: 105 mEq/L (ref 96–112)
Creatinine, Ser: 3.09 mg/dL — ABNORMAL HIGH (ref 0.40–1.50)
GFR: 19.01 mL/min — ABNORMAL LOW (ref >60.00)
Glucose, Bld: 137 mg/dL — ABNORMAL HIGH (ref 70–99)
Potassium: 3.6 mEq/L (ref 3.5–5.1)
Sodium: 141 mEq/L (ref 135–145)

## 2022-06-03 LAB — POCT GLYCOSYLATED HEMOGLOBIN (HGB A1C): Hemoglobin A1C: 6.6 % — AB (ref 4.0–5.6)

## 2022-06-03 NOTE — Patient Instructions (Addendum)
Your A1c was 6.6.   I am going to have you continue with Starlix and stop your nighttime insulin  I am going to check some lab work and a chest xray   I may refer you to cardiology

## 2022-06-03 NOTE — Addendum Note (Signed)
Addended by: Apolinar Junes on: 06/03/2022 01:59 PM   Modules accepted: Orders

## 2022-06-03 NOTE — Progress Notes (Addendum)
Subjective:    Patient ID: [name] [name], male    DOB: 1945-08-17, 76 y.o.   MRN: [id number]  HPI 76 year old male who  has a past medical history of Anemia, Cataract, DIABETES MELLITUS, TYPE II (01/02/2007), HYPERLIPIDEMIA (06/18/2007), HYPERTENSION (01/02/2007), RENAL INSUFFICIENCY (02/05/2008), and Stroke ([place]) (2019).  He presents to the office today for follow-up regarding diabetes mellitus and hypertension  Diabetes mellitus type 2-managed with Starlix 60 mg daily and Lantus 9 units in the morning and 3 units in the evening.  He does monitor his blood sugars at home and reports readings in the 80s to 200s.  He denies episodes of hypoglycemia.  Most of his blood sugars are in the 120s to 130s. He denies hypoglycemic episodes  Lab Results  Component Value Date   HGBA1C 7.0 (H) 03/16/2022   Hypertension with chronic kidney disease stage IV-managed by nephrology.  He is currently prescribed hydralazine 50 mg 3 times daily, Norvasc 5 mg daily, valsartan 160 mg daily, and labetalol 200 mg 3 times daily.  He does check his blood sugars at home with readings usually in the 120s to 130s BP Readings from Last 3 Encounters:  06/03/22 (!) 150/90  03/16/22 (!) 140/70  12/31/21 (!) 145/59   Additionally he reports that over the last few months he has been having DOE. When he goes on a walk he becomes very winded and has has a hard time breathing. There is no associated CP. It take him. He does not have dyspnea with rest. His wife reports tat he continues to state " I just don't feel right".   Review of Systems See HPI   Past Medical History:  Diagnosis Date   Anemia    Cataract    DIABETES MELLITUS, TYPE II 01/02/2007   HYPERLIPIDEMIA 06/18/2007   HYPERTENSION 01/02/2007   RENAL INSUFFICIENCY 02/05/2008   Stroke ([place]) 2019    Social History   Socioeconomic History   Marital status: Married    Spouse name: Not on file   Number of children: Not on file   Years of education: Not  on file   Highest education level: Not on file  Occupational History   Not on file  Tobacco Use   Smoking status: Never   Smokeless tobacco: Never  Vaping Use   Vaping Use: Never used  Substance and Sexual Activity   Alcohol use: No   Drug use: No   Sexual activity: Not on file  Other Topics Concern   Not on file  Social History Narrative   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 Likes to go to football games.    Social Determinants of Health   Financial Resource Strain: Low Risk  (02/14/2022)   Overall Financial Resource Strain (CARDIA)    Difficulty of Paying Living Expenses: Not hard at all  Food Insecurity: No Food Insecurity (08/16/2021)   Hunger Vital Sign    Worried About Running Out of Food in the Last Year: Never true    Ran Out of Food in the Last Year: Never true  Transportation Needs: No Transportation Needs (08/16/2021)   PRAPARE - Hydrologist (Medical): No    Lack of Transportation (Non-Medical): No  Physical Activity: Inactive (08/16/2021)   Exercise Vital Sign    Days of Exercise per  Week: 0 days    Minutes of Exercise per Session: 0 min  Stress: No Stress Concern Present (08/16/2021)   [place]    Feeling of Stress : Only a little  Social Connections: Socially Integrated (08/16/2021)   Social Connection and Isolation Panel [NHANES]    Frequency of Communication with Friends and Family: More than three times a week    Frequency of Social Gatherings with Friends and Family: More than three times a week    Attends Religious Services: More than 4 times per year    Active Member of Clubs or Organizations: Yes    Attends Archivist Meetings: More than 4 times per year    Marital Status: Married  Human resources officer Violence: Not At Risk (08/16/2021)    Humiliation, Afraid, Rape, and Kick questionnaire    Fear of Current or Ex-Partner: No    Emotionally Abused: No    Physically Abused: No    Sexually Abused: No    Past Surgical History:  Procedure Laterality Date   LOOP RECORDER INSERTION N/A 07/11/2017   Procedure: LOOP RECORDER INSERTION;  Surgeon: Thompson Grayer, MD;  Location: [facility];  Service: Cardiovascular;  Laterality: N/A;   SHOULDER SURGERY     left   TEE WITHOUT CARDIOVERSION N/A 07/11/2017   Procedure: TRANSESOPHAGEAL ECHOCARDIOGRAM (TEE);  Surgeon: Dorothy Spark, MD;  Location: [facility]OS[facility];  Service: Cardiovascular;  Laterality: N/A;   TRANSURETHRAL RESECTION OF PROSTATE     history of retention/hematuria     Family History  Problem Relation Age of Onset   Hypertension Mother    Diabetes Mother    Stroke Father    Stroke Maternal Grandmother    Colon cancer Neg Hx    Esophageal cancer Neg Hx    Rectal cancer Neg Hx    Stomach cancer Neg Hx     Allergies  Allergen Reactions   Evolocumab Hypertension    Other reaction(s): Other (see comments)   5-Alpha Reductase Inhibitors Other (See Comments)    myalgia Other reaction(s): Other (see comments)   Bactrim [Sulfamethoxazole-Trimethoprim] Hives, Itching and Swelling   Penicillins Hives    Has patient had a PCN reaction causing immediate rash, facial/tongue/throat swelling, SOB or lightheadedness with hypotension: Unk Has patient had a PCN reaction causing severe rash involving mucus membranes or skin necrosis: Unk Has patient had a PCN reaction that required hospitalization: Unk Has patient had a PCN reaction occurring within the last 10 years: No If all of the above answers are "NO", then may proceed with Cephalosporin use. Other reaction(s): Other (see comments)   Statins     myalgia Other reaction(s): Other (see comments)   Sulfa Antibiotics     Other reaction(s): Other (see comments)   Sulfa Drugs Cross Reactors Hives, Itching and Swelling    Sulfamethoxazole-Trimethoprim Hives and Itching    Other reaction(s): Other (see comments)    Current Outpatient Medications on File Prior to Visit  Medication Sig Dispense Refill   amLODipine (NORVASC) 5 MG tablet Take 5 mg by mouth in the morning and at bedtime.     aspirin 325 MG tablet Take 325 mg by mouth daily.     B Complex Vitamins (VITAMIN B COMPLEX) TABS Take by mouth.     Blood Glucose Monitoring Suppl (ONETOUCH VERIO) w/Device KIT Use with existing test strips 1 kit kit   brimonidine (ALPHAGAN) 0.2 % ophthalmic solution Place 1 drop into both eyes 2 (  two) times daily.     calcitRIOL (ROCALTROL) 0.25 MCG capsule Take 1 capsule by mouth every other day.     Cholecalciferol 50 MCG (2000 UT) CAPS Take 1 capsule by mouth daily.     ezetimibe (ZETIA) 10 MG tablet TAKE 1 TABLET(10 MG) BY MOUTH DAILY 90 tablet 3   ferrous sulfate 325 (65 FE) MG tablet Take 325 mg by mouth 2 (two) times daily with a meal.     hydrALAZINE (APRESOLINE) 100 MG tablet Take 100 mg by mouth 3 (three) times daily.     Insulin Pen Needle (B-D UF III MINI PEN NEEDLES) 31G X 5 MM MISC Inject 1 Needle into the skin in the morning and at bedtime. USE TO TEST BLOOD GLUCOSE FOUR TIMES DAILY 200 each 3   labetalol (NORMODYNE) 200 MG tablet TAKE 1 TABLET(200 MG) BY MOUTH THREE TIMES DAILY 270 tablet 3   latanoprost (XALATAN) 0.005 % ophthalmic solution 1 drop at bedtime.     nateglinide (STARLIX) 60 MG tablet TAKE 1 TABLET BY MOUTH ONCE A DAY WITH MEALS     ONETOUCH VERIO test strip USE TO TEST TWICE DAILY AS DIRECTED 200 strip 2   timolol (TIMOPTIC) 0.5 % ophthalmic solution 1 drop 2 (two) times daily.     traMADol (ULTRAM) 50 MG tablet Take 1-2 tablets (50-100 mg total) by mouth daily as needed. 20 tablet 0   valsartan (DIOVAN) 160 MG tablet Take 80 mg by mouth 2 (two) times daily.     insulin glargine (LANTUS SOLOSTAR) 100 UNIT/ML Solostar Pen Inject 9 Units into the skin 2 (two) times daily. 15 mL 0   No current  facility-administered medications on file prior to visit.    BP (!) 150/90   Pulse 65   Temp (!) 97.2 F (36.2 C) (Oral)   Ht _0  (1.88 m)   Wt 225 lb (102.1 kg)   SpO2 100%   BMI 28.89 kg/m       Objective:   Physical Exam Vitals and nursing note reviewed.  Constitutional:      Appearance: Normal appearance.  Cardiovascular:     Rate and Rhythm: Normal rate and regular rhythm.     Pulses: Normal pulses.     Heart sounds: Normal heart sounds.  Pulmonary:     Effort: Pulmonary effort is normal.     Breath sounds: Normal breath sounds.  Musculoskeletal:        General: Normal range of motion.     Right lower leg: No edema.     Left lower leg: No edema.  Skin:    General: Skin is warm and dry.     Capillary Refill: Capillary refill takes less than 2 seconds.  Neurological:     General: No focal deficit present.     Mental Status: He is alert and oriented to person, place, and time.  Psychiatric:        Mood and Affect: Mood normal.        Behavior: Behavior normal.        Thought Content: Thought content normal.        Judgment: Judgment normal.       Assessment & Plan:  1. Type 2 diabetes mellitus with stage 4 chronic kidney disease, with long-term current use of insulin (HCC)  - POC HgB A1c- 6.6 - has improved. Will have him D/c his evening insulin  - Continue with Starlix and morning lantus   2. Hypertensive kidney disease - at  goal. No change in medication   3. DOE (dyspnea on exertion)  - DG Chest 2 View; Future - EKG 12-Lead- sinus bradycardia, non specific ST depression, rate 59. ST depression consistent with previous EKGs.  - Amb referral to Cardiology  - CBC with Differential/Platelet; Future - Basic Metabolic Panel; Future - Microalbumin/Creatinine Ratio, Urine; Future - Microalbumin/Creatinine Ratio, Urine - Basic Metabolic Panel - CBC with Differential/Platelet  Dorothyann Peng, NP

## 2022-06-08 ENCOUNTER — Encounter: Payer: Self-pay | Admitting: *Deleted

## 2022-06-08 ENCOUNTER — Telehealth: Payer: Self-pay | Admitting: *Deleted

## 2022-06-08 ENCOUNTER — Telehealth (HOSPITAL_COMMUNITY): Payer: Self-pay | Admitting: *Deleted

## 2022-06-08 ENCOUNTER — Encounter: Payer: Self-pay | Admitting: Cardiology

## 2022-06-08 ENCOUNTER — Ambulatory Visit: Payer: Medicare Other | Attending: Cardiology | Admitting: Cardiology

## 2022-06-08 VITALS — BP 155/79 | HR 65 | Ht 74.0 in | Wt 227.0 lb

## 2022-06-08 DIAGNOSIS — N184 Chronic kidney disease, stage 4 (severe): Secondary | ICD-10-CM

## 2022-06-08 DIAGNOSIS — Z794 Long term (current) use of insulin: Secondary | ICD-10-CM | POA: Diagnosis not present

## 2022-06-08 DIAGNOSIS — E118 Type 2 diabetes mellitus with unspecified complications: Secondary | ICD-10-CM | POA: Diagnosis not present

## 2022-06-08 DIAGNOSIS — I10 Essential (primary) hypertension: Secondary | ICD-10-CM

## 2022-06-08 DIAGNOSIS — R0609 Other forms of dyspnea: Secondary | ICD-10-CM | POA: Diagnosis not present

## 2022-06-08 NOTE — Patient Instructions (Signed)
Medication Instructions:  Continue same medications   Lab Work: None ordered   Testing/Procedures: Echo  [place]: At [facility], you and your health needs are our priority.  As part of our continuing mission to provide you with exceptional heart care, we have created designated Provider Care Teams.  These Care Teams include your primary Cardiologist (physician) and Advanced Practice Providers (APPs -  Physician Assistants and Nurse Practitioners) who all work together to provide you with the care you need, when you need it.  We recommend signing up for the patient portal called "MyChart".  Sign up information is provided on this After Visit Summary.  MyChart is used to connect with patients for Virtual Visits (Telemedicine).  Patients are able to view lab/test results, encounter notes, upcoming appointments, etc.  Non-urgent messages can be sent to your provider as well.   To learn more about what you can do with MyChart, go to [email or web address].    Your next appointment:  After Test    The format for your next appointment: Office    Provider:  Dr.Jordan   Important Information About Sugar

## 2022-06-08 NOTE — Patient Instructions (Signed)
Visit Information  Thank you for taking time to visit with me today. Please don't hesitate to contact me if I can be of assistance to you.   Following are the goals we discussed today:   Goals Addressed               This Visit's Progress     COMPLETED: living will update and needs a blood pressure cuff (pt-stated)        Care Coordination Interventions: Provided assistance with obtaining home blood pressure monitor via mail out for ongoing monitoring and management of care; Advised patient, providing education and rationale, to monitor blood pressure daily and record, calling PCP for findings outside established parameters Reviewed scheduled/upcoming provider appointments including:  Advised patient to discuss adjustment of new monitoring on the upcoming appointment with provider Discussed complications of poorly controlled blood pressure such as heart disease, stroke, circulatory complications, vision complications, kidney impairment, sexual dysfunction Screening for signs and symptoms of depression related to chronic disease state  Assessed social determinant of health barriers         Please call the care guide team at [phone] if you need to cancel or reschedule your appointment.   If you are experiencing a Mental Health or [place] or need someone to talk to, please call the Suicide and Crisis Lifeline: 988 call the [place] National Suicide Prevention Lifeline: (361) 884-1855 or TTY: 414-692-5816 TTY 530-183-5472) to talk to a trained counselor call 1-800-273-TALK (toll free, 24 hour hotline)  Patient verbalizes understanding of instructions and care plan provided today and agrees to view in [place]. Active MyChart status and patient understanding of how to access instructions and care plan via MyChart confirmed with patient.     No further follow up required: No further needs at this time.   Raina Mina, RN Care Management Coordinator [place] Office [phone]

## 2022-06-08 NOTE — Telephone Encounter (Signed)
Patient's wife, per dpr, given detailed instructions per Myocardial Perfusion Study Information Sheet for the test on 06/09/22 Patient notified to arrive 15 minutes early and that it is imperative to arrive on time for appointment to keep from having the test rescheduled.  If you need to cancel or reschedule your appointment, please call the office within 24 hours of your appointment. . Patient verbalized understanding.Kirstie Peri

## 2022-06-08 NOTE — Patient Outreach (Addendum)
  Care Coordination   Initial Visit Note   06/08/2022 Name: [name] [name] MRN: [id number] DOB: Jul 03, 1946  [name] [name] is a 76 y.o. year old male who sees Nafziger, [name], NP for primary care. I spoke with  [name] [name] by phone today.  What matters to the patients health and wellness today?  "Need to update my living will and a blood pressure cuff".    Goals Addressed               This Visit's Progress     COMPLETED: living will update and needs a blood pressure cuff (pt-stated)        Care Coordination Interventions: Provided assistance with obtaining home blood pressure monitor via mail out for ongoing monitoring and management of care; Advised patient, providing education and rationale, to monitor blood pressure daily and record, calling PCP for findings outside established parameters Reviewed scheduled/upcoming provider appointments including:  Advised patient to discuss adjustment of new monitoring on the upcoming appointment with provider Discussed complications of poorly controlled blood pressure such as heart disease, stroke, circulatory complications, vision complications, kidney impairment, sexual dysfunction Screening for signs and symptoms of depression related to chronic disease state  Assessed social determinant of health barriers Will send A.D packet as requested for update on pt's existing living will.         SDOH assessments and interventions completed:  Yes  SDOH Interventions Today    Flowsheet Row Most Recent Value  SDOH Interventions   Food Insecurity Interventions Intervention Not Indicated  Housing Interventions Intervention Not Indicated  Transportation Interventions Intervention Not Indicated  Utilities Interventions Intervention Not Indicated        Care Coordination Interventions:  Yes, provided   Follow up plan: No further intervention required.   Encounter Outcome:  Pt. Visit Completed   Raina Mina, RN Care Management  Coordinator [place] Office [phone]

## 2022-06-08 NOTE — Progress Notes (Signed)
Cardiology Office Note:    Date:  06/08/2022   ID:  NASER SCHULD, DOB 1946/03/31, MRN [id number]  PCP:  Dorothyann Peng, NP   [place] Providers Cardiologist:  None     Referring MD: Dorothyann Peng, NP   Chief Complaint  Patient presents with   Shortness of Breath    History of Present Illness:    TEMESGEN WEIGHTMAN is a 76 y.o. male who is seen at the request of Dr Carlisle Cater for evaluation of dyspnea on exertion. He has a history of DM type 2, HTN, HLD and CKD. Prior CVA in 2019. Had ILR placed in 2019 with no arrhythmias noted to date. Echo and TEE in 2019 were normal. He has moderate carotid arterial disease followed by Vascular surgery in [place].  The patient reports that over the past month he has experienced symptoms of SOB with exertion. Relieved with rest. If he walks far also notes his heart beats hard. No real chest pain. No palpitations or swelling. Is under a lot of stress at home since his daughter is quite ill.   Past Medical History:  Diagnosis Date   Anemia    Cataract    DIABETES MELLITUS, TYPE II 01/02/2007   HYPERLIPIDEMIA 06/18/2007   HYPERTENSION 01/02/2007   RENAL INSUFFICIENCY 02/05/2008   Stroke ([place]) 2019    Past Surgical History:  Procedure Laterality Date   LOOP RECORDER INSERTION N/A 07/11/2017   Procedure: LOOP RECORDER INSERTION;  Surgeon: Thompson Grayer, MD;  Location: [facility];  Service: Cardiovascular;  Laterality: N/A;   SHOULDER SURGERY     left   TEE WITHOUT CARDIOVERSION N/A 07/11/2017   Procedure: TRANSESOPHAGEAL ECHOCARDIOGRAM (TEE);  Surgeon: Dorothy Spark, MD;  Location: [facility];  Service: Cardiovascular;  Laterality: N/A;   TRANSURETHRAL RESECTION OF PROSTATE     history of retention/hematuria     Current Medications: Current Meds  Medication Sig   amLODipine (NORVASC) 5 MG tablet Take 5 mg by mouth in the morning and at bedtime.   aspirin 325 MG tablet Take 325 mg by mouth daily.   B Complex  Vitamins (VITAMIN B COMPLEX) TABS Take by mouth.   Blood Glucose Monitoring Suppl (ONETOUCH VERIO) w/Device KIT Use with existing test strips   calcitRIOL (ROCALTROL) 0.25 MCG capsule Take 1 capsule by mouth every other day.   Cholecalciferol 50 MCG (2000 UT) CAPS Take 1 capsule by mouth daily.   ezetimibe (ZETIA) 10 MG tablet TAKE 1 TABLET(10 MG) BY MOUTH DAILY   ferrous sulfate 325 (65 FE) MG tablet Take 325 mg by mouth 2 (two) times daily with a meal.   hydrALAZINE (APRESOLINE) 100 MG tablet Take 100 mg by mouth 3 (three) times daily.   insulin glargine (LANTUS SOLOSTAR) 100 UNIT/ML Solostar Pen Inject 9 Units into the skin 2 (two) times daily. (Patient taking differently: Inject 9 Units into the skin daily.)   Insulin Pen Needle (B-D UF III MINI PEN NEEDLES) 31G X 5 MM MISC Inject 1 Needle into the skin in the morning and at bedtime. USE TO TEST BLOOD GLUCOSE FOUR TIMES DAILY   labetalol (NORMODYNE) 200 MG tablet TAKE 1 TABLET(200 MG) BY MOUTH THREE TIMES DAILY   latanoprost (XALATAN) 0.005 % ophthalmic solution 1 drop at bedtime.   nateglinide (STARLIX) 60 MG tablet TAKE 1 TABLET BY MOUTH ONCE A DAY WITH MEALS   ONETOUCH VERIO test strip USE TO TEST TWICE DAILY AS DIRECTED   timolol (TIMOPTIC) 0.5 %  ophthalmic solution 1 drop 2 (two) times daily.   traMADol (ULTRAM) 50 MG tablet Take 1-2 tablets (50-100 mg total) by mouth daily as needed.   valsartan (DIOVAN) 160 MG tablet Take 80 mg by mouth 2 (two) times daily.     Allergies:   Evolocumab, 5-alpha reductase inhibitors, Bactrim [sulfamethoxazole-trimethoprim], Penicillins, Statins, Sulfa antibiotics, Sulfa drugs cross reactors, and Sulfamethoxazole-trimethoprim   Social History   Socioeconomic History   Marital status: Married    Spouse name: Not on file   Number of children: Not on file   Years of education: Not on file   Highest education level: Not on file  Occupational History   Not on file  Tobacco Use   Smoking status:  Never   Smokeless tobacco: Never  Vaping Use   Vaping Use: Never used  Substance and Sexual Activity   Alcohol use: No   Drug use: No   Sexual activity: Not on file  Other Topics Concern   Not on file  Social History Narrative   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   He goes to the gym and works out. Likes to go to football games.    Social Determinants of Health   Financial Resource Strain: Low Risk  (02/14/2022)   Overall Financial Resource Strain (CARDIA)    Difficulty of Paying Living Expenses: Not hard at all  Food Insecurity: No Food Insecurity (08/16/2021)   Hunger Vital Sign    Worried About Running Out of Food in the Last Year: Never true    Ran Out of Food in the Last Year: Never true  Transportation Needs: No Transportation Needs (08/16/2021)   PRAPARE - Hydrologist (Medical): No    Lack of Transportation (Non-Medical): No  Physical Activity: Inactive (08/16/2021)   Exercise Vital Sign    Days of Exercise per Week: 0 days    Minutes of Exercise per Session: 0 min  Stress: No Stress Concern Present (08/16/2021)   [place]    Feeling of Stress : Only a little  Social Connections: Socially Integrated (08/16/2021)   Social Connection and Isolation Panel [NHANES]    Frequency of Communication with Friends and Family: More than three times a week    Frequency of Social Gatherings with Friends and Family: More than three times a week    Attends Religious Services: More than 4 times per year    Active Member of [employer] or Organizations: Yes    Attends Music therapist: More than 4 times per year    Marital Status: Married     Family History: The patient's family history includes Diabetes in his mother; Hypertension in his mother; Stroke in his father and maternal grandmother. There is no  history of Colon cancer, Esophageal cancer, Rectal cancer, or Stomach cancer.  ROS:   Please see the history of present illness.     All other systems reviewed and are negative.  EKGs/Labs/Other Studies Reviewed:    The following studies were reviewed today: Echo 07/09/17: Study Conclusions   - Left ventricle: The cavity size was mildly dilated. Wall    thickness was increased in a pattern of mild LVH. Systolic    function was normal. The estimated ejection fraction was in the    range of 55% to 60%. Wall motion was normal; there  were no    regional wall motion abnormalities. Doppler parameters are    consistent with abnormal left ventricular relaxation (grade 1    diastolic dysfunction).  - Left atrium: The atrium was mildly dilated.   TEE 07/11/17: Study Conclusions   - Left ventricle: There was mild concentric hypertrophy. Systolic    function was normal. The estimated ejection fraction was in the    range of 55% to 60%. Wall motion was normal; there were no    regional wall motion abnormalities.  - Aorta: There was moderate non-mobile atheroma.  - Left atrium: The atrium was dilated. No evidence of thrombus in    the atrial cavity or appendage. No evidence of thrombus in the    atrial cavity or appendage. The appendage was morphologically a    left appendage, multilobulated, and of normal size. Emptying    velocity was normal.  - Right atrium: No evidence of thrombus in the atrial cavity or    appendage.  - Atrial septum: No defect or patent foramen ovale was identified.  - Tricuspid valve: There was mild regurgitation.   Impressions:   - No cardiac source of emboli was indentified.   EKG:  EKG is not ordered today.  The ekg ordered 06/03/22 demonstrates NSR rate 59. Nonspecific ST-T abnormality. I have personally reviewed and interpreted this study.  CHEST - 2 VIEW   COMPARISON:  January 25, 2019   FINDINGS: The heart size and mediastinal contours are stable. Both lungs  are clear. The visualized skeletal structures are stable.   IMPRESSION: No active cardiopulmonary disease.     Electronically Signed   By: Abelardo Diesel M.D.   On: 06/04/2022 12:30   Recent Labs: 03/16/2022: ALT 11; TSH 2.86 06/03/2022: BUN 43; Creatinine, Ser 3.09; Hemoglobin 11.8; Platelets 270.0; Potassium 3.6; Sodium 141  Recent Lipid Panel    Component Value Date/Time   CHOL 181 03/16/2022 0744   TRIG 135.0 03/16/2022 0744   TRIG 323 (HH) 06/13/2006 1050   HDL 41.60 03/16/2022 0744   CHOLHDL 4 03/16/2022 0744   VLDL 27.0 03/16/2022 0744   LDLCALC 113 (H) 03/16/2022 0744   LDLCALC 98 01/15/2020 0811   LDLDIRECT 180.7 06/24/2013 1106      Physical Exam:    VS:  BP (!) 155/79 (BP Location: Left Arm, Patient Position: Sitting, Cuff Size: Large)   Pulse 65   Ht _0  (1.88 m)   Wt 227 lb (103 kg)   SpO2 99%   BMI 29.15 kg/m     Wt Readings from Last 3 Encounters:  06/08/22 227 lb (103 kg)  06/03/22 225 lb (102.1 kg)  03/16/22 225 lb (102.1 kg)     GEN:  Well nourished, well developed in no acute distress HEENT: Normal NECK: No JVD; No carotid bruits LYMPHATICS: No lymphadenopathy CARDIAC: RRR, no murmurs, rubs, gallops RESPIRATORY:  Clear to auscultation without rales, wheezing or rhonchi  ABDOMEN: Soft, non-tender, non-distended MUSCULOSKELETAL:  No edema; No deformity  SKIN: Warm and dry NEUROLOGIC:  Alert and oriented x 3 PSYCHIATRIC:  Normal affect   ASSESSMENT:    1. Dyspnea on exertion   2. Primary hypertension   3. CKD (chronic kidney disease) stage 4, GFR 15-29 ml/min (HCC)   4. Type 2 diabetes mellitus with complication, with long-term current use of insulin ([place])    PLAN:    In order of problems listed above:  DOE. This could represent LV dysfunction given longstanding DM and HTN. Also need to  consider ischemia. Will arrange for Echo and Lexiscan myoview to assess. Would try and avoid contrast studies given CKD.  Follow up post  studies. HTN poorly controlled on multiple medications.  DM type 2 on insulin HLD - on Zetia. Intolerant to multiple meds Carotid arterial disease followed by vascular surgery in [place] CKD stage 4 History of prior CVA      Shared Decision Making/Informed Consent The risks [chest pain, shortness of breath, cardiac arrhythmias, dizziness, blood pressure fluctuations, myocardial infarction, stroke/transient ischemic attack, nausea, vomiting, allergic reaction, radiation exposure, metallic taste sensation and life-threatening complications (estimated to be 1 in 10,000)], benefits (risk stratification, diagnosing coronary artery disease, treatment guidance) and alternatives of a nuclear stress test were discussed in detail with Mr. Barella and he agrees to proceed.    Medication Adjustments/Labs and Tests Ordered: Current medicines are reviewed at length with the patient today.  Concerns regarding medicines are outlined above.  Orders Placed This Encounter  Procedures   Cardiac Stress Test: Informed Consent Details: Physician/Practitioner Attestation; Transcribe to consent form and obtain patient signature   No orders of the defined types were placed in this encounter.   There are no Patient Instructions on file for this visit.   Signed, Aydan Phoenix [place], MD  06/08/2022 10:51 AM    [place]

## 2022-06-09 ENCOUNTER — Other Ambulatory Visit (HOSPITAL_COMMUNITY): Payer: Self-pay | Admitting: Cardiology

## 2022-06-09 ENCOUNTER — Ambulatory Visit (HOSPITAL_COMMUNITY): Payer: Medicare Other | Attending: Cardiology

## 2022-06-09 ENCOUNTER — Ambulatory Visit (HOSPITAL_COMMUNITY): Payer: Medicare Other

## 2022-06-09 ENCOUNTER — Encounter (HOSPITAL_COMMUNITY): Payer: Self-pay

## 2022-06-09 VITALS — Ht 74.0 in | Wt 227.0 lb

## 2022-06-09 DIAGNOSIS — R0609 Other forms of dyspnea: Secondary | ICD-10-CM | POA: Diagnosis not present

## 2022-06-09 DIAGNOSIS — I10 Essential (primary) hypertension: Secondary | ICD-10-CM | POA: Diagnosis not present

## 2022-06-09 LAB — MYOCARDIAL PERFUSION IMAGING
LV dias vol: 218 mL (ref 62–150)
LV sys vol: 116 mL
Nuc Stress EF: 47 %
Rest Nuclear Isotope Dose: 10.1 mCi
SDS: 0
SRS: 0
SSS: 0
ST Depression (mm): 0 mm
Stress Nuclear Isotope Dose: 32.8 mCi
TID: 1.02

## 2022-06-09 MED ORDER — REGADENOSON 0.4 MG/5ML IV SOLN
0.4000 mg | Freq: Once | INTRAVENOUS | Status: AC
  Administered 2022-06-09: 0.4 mg via INTRAVENOUS

## 2022-06-09 MED ORDER — TECHNETIUM TC 99M TETROFOSMIN IV KIT
32.8000 | PACK | Freq: Once | INTRAVENOUS | Status: AC | PRN
  Administered 2022-06-09: 32.8 via INTRAVENOUS

## 2022-06-09 MED ORDER — TECHNETIUM TC 99M TETROFOSMIN IV KIT
10.1000 | PACK | Freq: Once | INTRAVENOUS | Status: AC | PRN
  Administered 2022-06-09: 10.1 via INTRAVENOUS

## 2022-06-14 ENCOUNTER — Telehealth: Payer: Self-pay | Admitting: Pharmacist

## 2022-06-14 DIAGNOSIS — R309 Painful micturition, unspecified: Secondary | ICD-10-CM | POA: Diagnosis not present

## 2022-06-14 DIAGNOSIS — I10 Essential (primary) hypertension: Secondary | ICD-10-CM | POA: Diagnosis not present

## 2022-06-14 DIAGNOSIS — E211 Secondary hyperparathyroidism, not elsewhere classified: Secondary | ICD-10-CM | POA: Diagnosis not present

## 2022-06-14 DIAGNOSIS — E559 Vitamin D deficiency, unspecified: Secondary | ICD-10-CM | POA: Diagnosis not present

## 2022-06-14 DIAGNOSIS — M1000 Idiopathic gout, unspecified site: Secondary | ICD-10-CM | POA: Diagnosis not present

## 2022-06-14 DIAGNOSIS — E1169 Type 2 diabetes mellitus with other specified complication: Secondary | ICD-10-CM | POA: Diagnosis not present

## 2022-06-14 DIAGNOSIS — N189 Chronic kidney disease, unspecified: Secondary | ICD-10-CM | POA: Diagnosis not present

## 2022-06-14 DIAGNOSIS — N184 Chronic kidney disease, stage 4 (severe): Secondary | ICD-10-CM | POA: Diagnosis not present

## 2022-06-14 DIAGNOSIS — R809 Proteinuria, unspecified: Secondary | ICD-10-CM | POA: Diagnosis not present

## 2022-06-14 DIAGNOSIS — D649 Anemia, unspecified: Secondary | ICD-10-CM | POA: Diagnosis not present

## 2022-06-14 NOTE — Chronic Care Management (AMB) (Signed)
    Chronic Care Management Pharmacy Assistant   Name: DALEY GOSSE  MRN: [id number] DOB: 29-Nov-1945  06/15/2022 APPOINTMENT [place] wife was reminded to have all medications, supplements and any blood glucose and blood pressure readings available for review with Jeni Salles, Pharm. D, at his telephone visit on 06/15/2022 at 10:45.  Care Gaps: AWV - scheduled 08/17/2022 Last BP - 155/79 on 06/08/2022 Last A1C - 6.6 on 06/03/2022 Shingrix - never done Tdap - overdue Covid - overdue Flu - postponed  Star Rating Drug: Nateglinide '60mg'$  - last filled 06/13/2022 90 DS at [facility] Valsartan '160mg'$  - last filled 05/13/2022 90 DS at [facility]  Any gaps in medications fill history? No  Gennie Alma [facility]  Catering manager [phone]

## 2022-06-15 ENCOUNTER — Ambulatory Visit: Payer: Medicare Other | Admitting: Adult Health

## 2022-06-15 ENCOUNTER — Ambulatory Visit (INDEPENDENT_AMBULATORY_CARE_PROVIDER_SITE_OTHER): Payer: Medicare Other | Admitting: Pharmacist

## 2022-06-15 DIAGNOSIS — I10 Essential (primary) hypertension: Secondary | ICD-10-CM

## 2022-06-15 DIAGNOSIS — N184 Chronic kidney disease, stage 4 (severe): Secondary | ICD-10-CM

## 2022-06-15 NOTE — Progress Notes (Signed)
Chronic Care Management Pharmacy Note  06/17/2022 Name:  [name] [name] MRN:  [id number] DOB:  Apr 16, 1946  Summary: BP home readings are mostly controlled A1c at goal < 8%  LDL not at goal < 55  Recommendations/Changes made from today's visit: -Recommended discussing decreasing dose of hydralazine with nephrologist  -Recommended bringing new glucometer to office if unable to figure out -Consider the addition of bempedoic acid or inclisiran for LDL lowering  Plan: BP and DM assessment in 2 months Follow up in 4 months  Subjective: [name] [name] is an 76 y.o. year old male who is a primary patient of Dorothyann Peng, NP.  The CCM team was consulted for assistance with disease management and care coordination needs.    Engaged with patient by telephone for follow up visit in response to provider referral for pharmacy case management and/or care coordination services.   Consent to Services:  The patient was given information about Chronic Care Management services, agreed to services, and gave verbal consent prior to initiation of services.  Please see initial visit note for detailed documentation.   Patient Care Team: Dorothyann Peng, NP as PCP - General (Family Medicine) Adegoroye, Wynona Luna, MD (Nephrology) Viona Gilmore, [facility] as Pharmacist (Pharmacist)  Recent office visits: 06/03/22 Dorothyann Peng, NP: Patient presented for DM follow up. D/c'd evening insulin. Referred to cardiology.   03/16/22 Dorothyann Peng, NP: Patient presented for annual exam. Follow up in 3 months.  Recent consult visits: 06/25/22 Gifford Shave, MD (nephrology): Patient presented for CKD follow up. No medication changes. Follow up in 4 months.  06/08/22 Peter Martinique, MD (cardiology): Patient presented for dyspnea on exertion.  Plan for Echo.   03/11/22 Frankey Shown, MD (ortho): Patient presented for right knee follow up.   02/25/22 Warden Fillers (ophthalmology): Patient presented for eye  exam. Unable to access notes.  02/11/22 Vista Mink, PT (ortho): Patient presented for PT treatment for difficulty walking.  02/03/22 Frankey Shown, MD (ortho): Patient presented for right knee pain follow up. Follow up in 4 weeks.  01/13/22 Frankey Shown, MD (ortho): Patient presented for right knee pain follow up. Follow up in 4 weeks.  12/31/21 Patient presented for colonoscopy.  12/29/21 Owens Loffler, MD (gastro): Patient presented for positive cologuard test screening. Plan for colonoscopy.  12/24/21 Gifford Shave, MD (nephrology): Patient presented for CKD follow up. Decreased calcitriol to every other day.  12/22/21 Frankey Shown, MD (ortho): Patient presented for right knee pain follow up. Referred to PT. Follow up in 3 weeks.  Hospital visits: None in previous 6 months  Objective:  Lab Results  Component Value Date   CREATININE 3.09 (H) 06/03/2022   BUN 43 (H) 06/03/2022   GFR 19.01 (L) 06/03/2022   GFRNONAA 23 (L) 08/07/2020   GFRAA 30 05/24/2019   NA 141 06/03/2022   K 3.6 06/03/2022   CALCIUM 9.3 06/03/2022   CO2 26 06/03/2022   GLUCOSE 137 (H) 06/03/2022    Lab Results  Component Value Date/Time   HGBA1C 6.6 (A) 06/03/2022 07:45 AM   HGBA1C 7.0 (H) 03/16/2022 07:44 AM   HGBA1C 7.1 (A) 11/16/2021 11:24 AM   HGBA1C 7.2 (H) 05/20/2021 10:09 AM   HGBA1C 6.5 06/19/2018 08:43 AM   HGBA1C 5.5 03/21/2018 07:14 AM   GFR 19.01 (L) 06/03/2022 08:22 AM   GFR 20.89 (L) 03/16/2022 07:44 AM   MICROALBUR 10.4 (H) 06/03/2022 08:22 AM   MICROALBUR 2.2 (H) 02/13/2014 09:53 AM    Last diabetic Eye exam:  Lab Results  Component Value Date/Time   HMDIABEYEEXA No Retinopathy 03/24/2021 12:00 AM    Last diabetic Foot exam:  Lab Results  Component Value Date/Time   HMDIABFOOTEX done 09/30/2013 12:00 AM     Lab Results  Component Value Date   CHOL 181 03/16/2022   HDL 41.60 03/16/2022   LDLCALC 113 (H) 03/16/2022   LDLDIRECT 180.7 06/24/2013   TRIG 135.0 03/16/2022    CHOLHDL 4 03/16/2022       Latest Ref Rng & Units 03/16/2022    7:44 AM 01/08/2021    8:18 AM 08/07/2020    8:53 AM  Hepatic Function  Total Protein 6.0 - 8.3 g/dL 6.9  6.8  6.9   Albumin 3.5 - 5.2 g/dL 4.0  4.3  3.9   AST 0 - 37 U/L _0 ALT 0 - 53 U/L _1 Alk Phosphatase 39 - 117 U/L 49  47  58   Total Bilirubin 0.2 - 1.2 mg/dL 0.5  0.4  0.4     Lab Results  Component Value Date/Time   TSH 2.86 03/16/2022 07:44 AM   TSH 2.43 01/08/2021 08:18 AM       Latest Ref Rng & Units 06/03/2022    8:22 AM 03/16/2022    7:44 AM 01/08/2021    8:18 AM  CBC  WBC 4.0 - 10.5 K/uL 9.1  7.8  7.6   Hemoglobin 13.0 - 17.0 g/dL 11.8  11.4  11.1   Hematocrit 39.0 - 52.0 % 35.6  34.3  33.5   Platelets 150.0 - 400.0 K/uL 270.0  230.0  240.0     Lab Results  Component Value Date/Time   VD25OH 45 09/04/2019 12:00 AM   VD25OH 50.29 11/02/2018 08:16 AM   VD25OH 46 09/18/2018 12:00 AM   VD25OH 12.19 (L) 09/19/2017 10:00 AM    Clinical ASCVD: Yes  The ASCVD Risk score (Arnett DK, et al., 2019) failed to calculate for the following reasons:   The patient has a prior MI or stroke diagnosis       11/16/2021    7:09 AM 08/19/2021    7:00 AM 08/16/2021    9:50 AM  Depression screen PHQ 2/9  Decreased Interest 1 2 0  Down, Depressed, Hopeless _2 PHQ - 2 Score _3 Altered sleeping 2 2   Tired, decreased energy 1 2   Change in appetite 2 3   Feeling bad or failure about yourself  1 1   Trouble concentrating 1 0   Moving slowly or fidgety/restless 1 2   Suicidal thoughts 0 0   PHQ-9 Score 10 13   Difficult doing work/chores Somewhat difficult Somewhat difficult       Social History   Tobacco Use  Smoking Status Never  Smokeless Tobacco Never   BP Readings from Last 3 Encounters:  06/08/22 (!) 155/79  06/03/22 (!) 150/90  03/16/22 (!) 140/70   Pulse Readings from Last 3 Encounters:  06/08/22 65  06/03/22 65  03/16/22 62   Wt Readings from Last 3 Encounters:   06/09/22 227 lb (103 kg)  06/09/22 227 lb (103 kg)  06/08/22 227 lb (103 kg)   BMI Readings from Last 3 Encounters:  06/09/22 29.15 kg/m  06/09/22 29.15 kg/m  06/08/22 29.15 kg/m    Assessment/Interventions: Review of patient past medical history, allergies, medications, health status, including review of consultants reports, laboratory and other test  data, was performed as part of comprehensive evaluation and provision of chronic care management services.   SDOH:  (Social Determinants of Health) assessments and interventions performed: Yes (last 02/14/22) SDOH Interventions    Flowsheet Row Telephone from 06/08/2022 in [place] Management from 02/14/2022 in [place] at [place] from 08/16/2021 in [place] at [place] Management from 09/30/2020 in [place] at [place] from 08/13/2020 in [place] at [place] Interventions Intervention Not Indicated -- Intervention Not Indicated -- Intervention Not Indicated  Housing Interventions Intervention Not Indicated -- Intervention Not Indicated -- Intervention Not Indicated  Transportation Interventions Intervention Not Indicated -- Intervention Not Indicated Intervention Not Indicated Intervention Not Indicated  Utilities Interventions Intervention Not Indicated -- -- -- --  Financial Strain Interventions -- Intervention Not Indicated Intervention Not Indicated Intervention Not Indicated Intervention Not Indicated  Physical Activity Interventions -- -- Intervention Not Indicated -- Intervention Not Indicated  [Patient awaiting foot procedure and plans to restart exercise regimen after procedure is complete]  Stress Interventions -- -- Intervention Not Indicated -- Intervention Not Indicated  Social Connections Interventions -- -- Intervention Not Indicated --  Intervention Not Indicated       SDOH Screenings   Food Insecurity: No Food Insecurity (06/08/2022)  Housing: Low Risk  (06/08/2022)  Transportation Needs: No Transportation Needs (06/08/2022)  Utilities: Not At Risk (06/08/2022)  Alcohol Screen: Low Risk  (08/16/2021)  Depression (PHQ2-9): Medium Risk (11/16/2021)  Financial Resource Strain: Low Risk  (02/14/2022)  Physical Activity: Inactive (08/16/2021)  Social Connections: Socially Integrated (08/16/2021)  Stress: No Stress Concern Present (08/16/2021)  Tobacco Use: Low Risk  (06/08/2022)    [place]  Allergies  Allergen Reactions   Evolocumab Hypertension    Other reaction(s): Other (see comments)   5-Alpha Reductase Inhibitors Other (See Comments)    myalgia Other reaction(s): Other (see comments)   Bactrim [Sulfamethoxazole-Trimethoprim] Hives, Itching and Swelling   Penicillins Hives    Has patient had a PCN reaction causing immediate rash, facial/tongue/throat swelling, SOB or lightheadedness with hypotension: Unk Has patient had a PCN reaction causing severe rash involving mucus membranes or skin necrosis: Unk Has patient had a PCN reaction that required hospitalization: Unk Has patient had a PCN reaction occurring within the last 10 years: No If all of the above answers are "NO", then may proceed with Cephalosporin use. Other reaction(s): Other (see comments)   Statins     myalgia Other reaction(s): Other (see comments)   Sulfa Antibiotics     Other reaction(s): Other (see comments)   Sulfa Drugs Cross Reactors Hives, Itching and Swelling   Sulfamethoxazole-Trimethoprim Hives and Itching    Other reaction(s): Other (see comments)    Medications Reviewed Today     Reviewed by Tobi Bastos, RN (Registered Nurse) on 06/08/22 at [place] List Status: <None>   Medication Order Taking? Sig Documenting Provider Last Dose Status Informant  amLODipine (NORVASC) 5 MG tablet [id number] No Take 5 mg by mouth in the  morning and at bedtime. [provider] Taking Active   aspirin 325 MG tablet [id number] No Take 325 mg by mouth daily. [provider] Taking Active   B Complex Vitamins (VITAMIN B COMPLEX) TABS [id number] No Take by mouth. [provider] Taking Active   Blood Glucose Monitoring Suppl [facility] VERIO) w/Device KIT [id number] No Use with existing test strips Nafziger,  [name], NP Taking Active   calcitRIOL (ROCALTROL) 0.25 MCG capsule [id number] No Take 1 capsule by mouth every other day. [provider] Taking Active Multiple Informants  Cholecalciferol 50 MCG (2000 UT) CAPS [id number] No Take 1 capsule by mouth daily. [provider] Taking Active   ezetimibe (ZETIA) 10 MG tablet [id number] No TAKE 1 TABLET(10 MG) BY MOUTH DAILY Nafziger, [name], NP Taking Active   ferrous sulfate 325 (65 FE) MG tablet [id number] No Take 325 mg by mouth 2 (two) times daily with a meal. [provider] Taking Active            Med Note Lorne Skeens, [place]   Wed Jun 08, 2022 10:31 AM) Patient takes 1 tab daily  hydrALAZINE (APRESOLINE) 100 MG tablet [id number] No Take 100 mg by mouth 3 (three) times daily. [provider] Taking Active   insulin glargine (LANTUS SOLOSTAR) 100 UNIT/ML Solostar Pen [id number] No Inject 9 Units into the skin 2 (two) times daily.  Patient taking differently: Inject 9 Units into the skin daily.   Nafziger, [name], NP Taking Active   Insulin Pen Needle (B-D UF III MINI PEN NEEDLES) 31G X 5 MM MISC [id number] No Inject 1 Needle into the skin in the morning and at bedtime. USE TO TEST BLOOD GLUCOSE FOUR TIMES DAILY Nafziger, [name], NP Taking Active   labetalol (NORMODYNE) 200 MG tablet [id number] No TAKE 1 TABLET(200 MG) BY MOUTH THREE TIMES DAILY Nafziger, [name], NP Taking Active   latanoprost (XALATAN) 0.005 % ophthalmic solution [id number] No 1 drop at bedtime. [provider] Taking Active   nateglinide (STARLIX) 60 MG tablet [id number]  No TAKE 1 TABLET BY MOUTH ONCE A DAY WITH MEALS [provider] Taking Active   ONETOUCH VERIO test strip [id number] No USE TO TEST TWICE DAILY AS DIRECTED Nafziger, [name], NP Taking Active   timolol (TIMOPTIC) 0.5 % ophthalmic solution [id number] No 1 drop 2 (two) times daily. [provider] Taking Active   traMADol (ULTRAM) 50 MG tablet [id number] No Take 1-2 tablets (50-100 mg total) by mouth daily as needed. Persons, Bevely Palmer, [place] Taking Active   valsartan (DIOVAN) 160 MG tablet [id number] No Take 80 mg by mouth 2 (two) times daily. [provider] Taking Active             Patient Active Problem List   Diagnosis Date Noted   Strain of quadriceps, right, initial encounter 12/10/2021   Acute pain of right knee 11/24/2021   Drug-induced myopathy 01/08/2020   Iron deficiency anemia 04/03/2019   Anemia of chronic renal failure, stage 4 (severe) ([place]) 04/03/2019   Late effect of cerebrovascular accident (CVA) 12/07/2017   Hypoglycemia    Uncontrolled hypertension    Hypokalemia    Acute ischemic left MCA stroke ([place]) 07/12/2017   Diabetes mellitus type 2 in obese [facility])    Stage 3 chronic kidney disease ([place])    Left middle cerebral artery stroke ([place]) 82/64/1583   Acute embolic stroke ([place])    Aphasia 07/08/2017   Gout 07/21/2011   Prostatitis, chronic 07/14/2010   Hyperlipidemia 06/18/2007   Hypertensive kidney disease 01/02/2007    Immunization History  Administered Date(s) Administered   Influenza, High Dose Seasonal PF 06/19/2018   PFIZER(Purple Top)SARS-COV-2 Vaccination 01/18/2020, 02/08/2020, 05/21/2021   Pneumococcal Conjugate-13 01/03/2019   Pneumococcal Polysaccharide-23 12/20/2011   Td 11/13/2009   Patient's did decrease his hydralazine to 50 mg TID and his BP was doing better then. She felt like she shouldn't  do this so she went back to his normal dose. Patient has had lower readings 120s - 140s recently. Patient does have higher readings  when he sees nephrologist.   Patient's daughter is sick and they have been stressed a lot more. Patient's wife is surprised that his BP and BGs aren't higher due to this. Patient's BP is still erratic sometimes though.  Patient was having some SOB and cardiologist didn't see anything just with checking him. They are hoping to find out more from the testing with cardiology.  Patient currently can't get the new glucometer to work. Patient's wife will bring it to the pharmacy to sort it out and if she is unable to, she will bring it to our office.  Patient's wife wants ideas of where to go in January with medications. She is making him go back to eating better and exercising more in the new year so she wants him to be able to decrease his medication burden. Discussed the option for decreasing hydralazine first of BP medications and probably insulin for DM medications.  Conditions to be addressed/monitored:  Hypertension, Hyperlipidemia, Diabetes, Chronic Kidney Disease, Gout and glaucoma, anemia, history of stroke  Conditions addressed this visit: Diabetes, hypertension, hyperlipidemia  Care Plan : CCM Pharmacy Care Plan  Updates made by Viona Gilmore, [place] since 06/17/2022 12:00 AM     Problem: Problem: Hypertension, Hyperlipidemia, Diabetes, Chronic Kidney Disease, Gout and glaucoma, anemia, history of stroke      Long-Range Goal: Patient-Specific Goal   Start Date: 09/30/2020  Expected End Date: 09/30/2021  Recent Progress: On track  Priority: High  Note:   Current Barriers:  Unable to maintain control of blood pressure Suboptimal therapeutic regimen for cholesterol  Pharmacist Clinical Goal(s):  Patient will maintain control of blood pressure as evidenced by home blood pressure readings  through collaboration with PharmD and provider.    Interventions: 1:1 collaboration with Dorothyann Peng, NP regarding development and update of comprehensive plan of care as evidenced by  provider attestation and co-signature Inter-disciplinary care team collaboration (see longitudinal plan of care) Comprehensive medication review performed; medication list updated in electronic medical record  Hypertension (BP goal <140/90) -Controlled -Current treatment: Amlodipine 5 mg 1 tablet twice daily - Appropriate, Query effective, Safe, Accessible Hydralazine 100 mg 1 tablet three times daily - Appropriate, Query effective, Safe, Accessible Labetalol 200 mg 1 tablet three times daily - Appropriate, Query effective, Safe, Accessible Valsartan 160 mg 1/2 tablet twice daily - Appropriate, Query effective, Safe, Accessible -Medications previously tried: benazepril atenolol-chlorthalidone  -Current home readings: 142/80 today; mostly 130-140s with a couple of 120s and 110s -Current dietary habits: patient is limiting salt and trying to eat cleaner since the stroke -Current exercise habits: going to the gym and using an elliptical 3 days a week -Denies hypotensive/hypertensive symptoms -Educated on Exercise goal of 150 minutes per week; Importance of home blood pressure monitoring; Proper BP monitoring technique; -Counseled to monitor BP at home daily, document, and provide log at future appointments -Counseled on diet and exercise extensively Recommended to continue current medication Recommended trial of lower dose of hydralazine and discussion with nephrologist.  Hyperlipidemia: (LDL goal < 70) -Uncontrolled -Current treatment: Ezetimibe 10 mg 1 tablet daily - Appropriate, Query effective, Safe, Accessible -Medications previously tried: Repatha (high BP), statins (myalgias) (pravastatin, atorvastatin, simvastatin) -Current dietary patterns: baking foods instead of frying; eating lots of fiber; has been consuming more ice cream lately -Current exercise habits: not active right now -Educated on Cholesterol goals;  Importance of limiting foods high in cholesterol; Exercise goal of  150 minutes per week; -Counseled on diet and exercise extensively Recommended to continue current medication Recommended limiting ice cream consumption.  History of stroke (Goal: prevent recurrent strokes) -Controlled -Current treatment  Aspirin 325 mg 1 tablet daily - Appropriate, Effective, Query Safe, Accessible -Medications previously tried: none  -Recommended to continue current medication Counseled on monitoring for signs of bleeding such as unexplained and excessive bleeding from a cut or injury, easy or excessive bruising, blood in urine or stools, and nosebleeds without a known cause  Diabetes (A1c goal <8%) (per PCP) -Controlled -Current medications: Lantus inject 9 units in the morning  - Appropriate, Effective, Safe, Accessible Nateglinide 60 mg 1 tablet once daily with meals (needs to be 15 mins before meals) - Appropriate, Effective, Safe, Accessible -Medications previously tried: glyburide, Novolog, Januvia -Current home glucose readings fasting glucose: 139, 130s -  mornings were a bit lower than evenings recently post prandial glucose: not checking after meals -Reports hypoglycemic/hyperglycemic symptoms -Current meal patterns:  breakfast: did not discuss  lunch: did not discuss   dinner: did not discuss  snacks: did not discuss  drinks: did not discuss  -Current exercise: has the intention of going back to the gym - wants to get back to the pool -Educated on Exercise goal of 150 minutes per week; Benefits of routine self-monitoring of blood sugar; Continuous glucose monitoring; Carbohydrate counting and/or plate method -Counseled to check feet daily and get yearly eye exams -Counseled on diet and exercise extensively Recommended to continue current medication Recommended checking blood sugars after meals to make sure it is not running lower.  Anemia (Goal: Hgb > 11) -Controlled -Current treatment  Ferrous sulfate 325 mg 1 tablet daily - Appropriate,  Effective, Safe, Accessible -Medications previously tried: none  -Recommended to continue current medication Counseled on taking with food to prevent upset stomach  Chronic kidney disease (Goal: prevent worsening of kidney function) -Controlled -Current treatment  No medications -Medications previously tried: none  -Counseled on diet and exercise extensively  Glaucoma (Goal: lower intraocular pressure) -Controlled -Current treatment  Alphagan 0.2% daily - Appropriate, Effective, Safe, Accessible Cosopt 22.3-6.8 daily - Appropriate, Effective, Safe, Accessible -Medications previously tried: none  -Recommended to continue current medication  Health Maintenance -Vaccine gaps: tetanus, shingles -Current therapy:  Vitamin D 2000 units daily Vitamin B complex daily -Educated on Cost vs benefit of each product must be carefully weighed by individual consumer -Patient is satisfied with current therapy and denies issues -Recommended to continue current medication  Patient Goals/Self-Care Activities Patient will:  - take medications as prescribed check glucose daily, document, and provide at future appointments check blood pressure a few times daily, document, and provide at future appointments  Follow Up Plan: The care management team will reach out to the patient again over the next 60 days.        Medication Assistance: None required.  Patient affirms current coverage meets needs.  Compliance/Adherence/Medication fill history: Care Gaps: Shingrix, COVID booster, tetanus, influenza vaccine Last BP - 155/79 on 06/08/2022 Last A1C - 6.6 on 06/03/2022  Star-Rating Drugs: Nateglinide 85m - last filled 06/13/2022 90 DS at [facility] Valsartan 1624m- [facility]  [id number]:  [phone]: [phone] Uses pill box? No - basket  on the counter Pt endorses 100% compliance  We discussed: Current pharmacy is preferred with insurance plan and patient is satisfied with pharmacy services Patient  to: Continue current medication management strategy  Care Plan and Follow Up Patient Decision:  Patient agrees to Care Plan and Follow-up.  Plan: The care management team will reach out to the patient again over the next 60 days.  Jeni Salles, PharmD [facility] Clinical Pharmacist [place] at [place]

## 2022-06-16 ENCOUNTER — Encounter (INDEPENDENT_AMBULATORY_CARE_PROVIDER_SITE_OTHER): Payer: Medicare Other | Admitting: Ophthalmology

## 2022-06-16 DIAGNOSIS — E113293 Type 2 diabetes mellitus with mild nonproliferative diabetic retinopathy without macular edema, bilateral: Secondary | ICD-10-CM | POA: Diagnosis not present

## 2022-06-16 DIAGNOSIS — H34232 Retinal artery branch occlusion, left eye: Secondary | ICD-10-CM | POA: Diagnosis not present

## 2022-06-16 DIAGNOSIS — H33301 Unspecified retinal break, right eye: Secondary | ICD-10-CM | POA: Diagnosis not present

## 2022-06-16 DIAGNOSIS — I10 Essential (primary) hypertension: Secondary | ICD-10-CM

## 2022-06-16 DIAGNOSIS — H34212 Partial retinal artery occlusion, left eye: Secondary | ICD-10-CM

## 2022-06-16 DIAGNOSIS — H35033 Hypertensive retinopathy, bilateral: Secondary | ICD-10-CM | POA: Diagnosis not present

## 2022-06-16 DIAGNOSIS — H43813 Vitreous degeneration, bilateral: Secondary | ICD-10-CM

## 2022-06-17 NOTE — Patient Instructions (Signed)
Hi Sajjad and Helena,  It was great to catch up again! Please let me know if you need any help with figuring out the new glucometer.  Please reach out to me if you have any questions or need anything!  Best, Maddie  Jeni Salles, PharmD, [facility]   Visit Information   Goals Addressed   None    Patient Care Plan: CCM Pharmacy Care Plan     Problem Identified: Problem: Hypertension, Hyperlipidemia, Diabetes, Chronic Kidney Disease, Gout and glaucoma, anemia, history of stroke      Long-Range Goal: Patient-Specific Goal   Start Date: 09/30/2020  Expected End Date: 09/30/2021  Recent Progress: On track  Priority: High  Note:   Current Barriers:  Unable to maintain control of blood pressure Suboptimal therapeutic regimen for cholesterol  Pharmacist Clinical Goal(s):  Patient will maintain control of blood pressure as evidenced by home blood pressure readings  through collaboration with PharmD and provider.    Interventions: 1:1 collaboration with Dorothyann Peng, NP regarding development and update of comprehensive plan of care as evidenced by provider attestation and co-signature Inter-disciplinary care team collaboration (see longitudinal plan of care) Comprehensive medication review performed; medication list updated in electronic medical record  Hypertension (BP goal <140/90) -Controlled -Current treatment: Amlodipine 5 mg 1 tablet twice daily - Appropriate, Query effective, Safe, Accessible Hydralazine 100 mg 1 tablet three times daily - Appropriate, Query effective, Safe, Accessible Labetalol 200 mg 1 tablet three times daily - Appropriate, Query effective, Safe, Accessible Valsartan 160 mg 1/2 tablet twice daily - Appropriate, Query effective, Safe, Accessible -Medications previously tried: benazepril atenolol-chlorthalidone  -Current home readings: 142/80 today; mostly 130-140s with a couple of  120s and 110s -Current dietary habits: patient is limiting salt and trying to eat cleaner since the stroke -Current exercise habits: going to the gym and using an elliptical 3 days a week -Denies hypotensive/hypertensive symptoms -Educated on Exercise goal of 150 minutes per week; Importance of home blood pressure monitoring; Proper BP monitoring technique; -Counseled to monitor BP at home daily, document, and provide log at future appointments -Counseled on diet and exercise extensively Recommended to continue current medication Recommended trial of lower dose of hydralazine and discussion with nephrologist.  Hyperlipidemia: (LDL goal < 70) -Uncontrolled -Current treatment: Ezetimibe 10 mg 1 tablet daily - Appropriate, Query effective, Safe, Accessible -Medications previously tried: Repatha (high BP), statins (myalgias) (pravastatin, atorvastatin, simvastatin) -Current dietary patterns: baking foods instead of frying; eating lots of fiber; has been consuming more ice cream lately -Current exercise habits: not active right now -Educated on Cholesterol goals;  Importance of limiting foods high in cholesterol; Exercise goal of 150 minutes per week; -Counseled on diet and exercise extensively Recommended to continue current medication Recommended limiting ice cream consumption.  History of stroke (Goal: prevent recurrent strokes) -Controlled -Current treatment  Aspirin 325 mg 1 tablet daily - Appropriate, Effective, Query Safe, Accessible -Medications previously tried: none  -Recommended to continue current medication Counseled on monitoring for signs of bleeding such as unexplained and excessive bleeding from a cut or injury, easy or excessive bruising, blood in urine or stools, and nosebleeds without a known cause  Diabetes (A1c goal <8%) (per PCP) -Controlled -Current medications: Lantus inject 9 units in the morning  - Appropriate, Effective, Safe, Accessible Nateglinide 60 mg  1 tablet once daily with meals (needs to be 15 mins before meals) - Appropriate, Effective, Safe, Accessible -Medications previously tried: glyburide, Novolog, Januvia -Current home glucose  readings fasting glucose: 139, 130s -  mornings were a bit lower than evenings recently post prandial glucose: not checking after meals -Reports hypoglycemic/hyperglycemic symptoms -Current meal patterns:  breakfast: did not discuss  lunch: did not discuss   dinner: did not discuss  snacks: did not discuss  drinks: did not discuss  -Current exercise: has the intention of going back to the gym - wants to get back to the pool -Educated on Exercise goal of 150 minutes per week; Benefits of routine self-monitoring of blood sugar; Continuous glucose monitoring; Carbohydrate counting and/or plate method -Counseled to check feet daily and get yearly eye exams -Counseled on diet and exercise extensively Recommended to continue current medication Recommended checking blood sugars after meals to make sure it is not running lower.  Anemia (Goal: Hgb > 11) -Controlled -Current treatment  Ferrous sulfate 325 mg 1 tablet daily - Appropriate, Effective, Safe, Accessible -Medications previously tried: none  -Recommended to continue current medication Counseled on taking with food to prevent upset stomach  Chronic kidney disease (Goal: prevent worsening of kidney function) -Controlled -Current treatment  No medications -Medications previously tried: none  -Counseled on diet and exercise extensively  Glaucoma (Goal: lower intraocular pressure) -Controlled -Current treatment  Alphagan 0.2% daily - Appropriate, Effective, Safe, Accessible Cosopt 22.3-6.8 daily - Appropriate, Effective, Safe, Accessible -Medications previously tried: none  -Recommended to continue current medication  Health Maintenance -Vaccine gaps: tetanus, shingles -Current therapy:  Vitamin D 2000 units daily Vitamin B complex  daily -Educated on Cost vs benefit of each product must be carefully weighed by individual consumer -Patient is satisfied with current therapy and denies issues -Recommended to continue current medication  Patient Goals/Self-Care Activities Patient will:  - take medications as prescribed check glucose daily, document, and provide at future appointments check blood pressure a few times daily, document, and provide at future appointments  Follow Up Plan: The care management team will reach out to the patient again over the next 60 days.         Patient verbalizes understanding of instructions and care plan provided today and agrees to view in [place]. Active MyChart status and patient understanding of how to access instructions and care plan via MyChart confirmed with patient.    The pharmacy team will reach out to the patient again over the next 60 days.   Viona Gilmore, [facility]

## 2022-07-03 DIAGNOSIS — N184 Chronic kidney disease, stage 4 (severe): Secondary | ICD-10-CM

## 2022-07-03 DIAGNOSIS — D649 Anemia, unspecified: Secondary | ICD-10-CM

## 2022-07-03 DIAGNOSIS — E1122 Type 2 diabetes mellitus with diabetic chronic kidney disease: Secondary | ICD-10-CM

## 2022-07-03 DIAGNOSIS — I129 Hypertensive chronic kidney disease with stage 1 through stage 4 chronic kidney disease, or unspecified chronic kidney disease: Secondary | ICD-10-CM

## 2022-07-03 DIAGNOSIS — Z794 Long term (current) use of insulin: Secondary | ICD-10-CM

## 2022-07-05 ENCOUNTER — Ambulatory Visit (HOSPITAL_COMMUNITY): Payer: Medicare Other | Attending: Cardiology

## 2022-07-05 DIAGNOSIS — N184 Chronic kidney disease, stage 4 (severe): Secondary | ICD-10-CM | POA: Insufficient documentation

## 2022-07-05 DIAGNOSIS — R0609 Other forms of dyspnea: Secondary | ICD-10-CM | POA: Diagnosis not present

## 2022-07-05 DIAGNOSIS — E118 Type 2 diabetes mellitus with unspecified complications: Secondary | ICD-10-CM | POA: Insufficient documentation

## 2022-07-05 DIAGNOSIS — Z794 Long term (current) use of insulin: Secondary | ICD-10-CM | POA: Insufficient documentation

## 2022-07-05 DIAGNOSIS — I10 Essential (primary) hypertension: Secondary | ICD-10-CM | POA: Diagnosis not present

## 2022-07-05 LAB — ECHOCARDIOGRAM COMPLETE
Area-P 1/2: 3.99 cm2
Calc EF: 48.2 %
S' Lateral: 4.6 cm
Single Plane A2C EF: 44.9 %
Single Plane A4C EF: 48.8 %

## 2022-07-05 NOTE — Progress Notes (Signed)
Cardiology Office Note:    Date:  07/15/2022   ID:  [name] [name], DOB September 13, 1945, MRN [id number]  PCP:  Dorothyann Peng, NP   [place] Providers Cardiologist:  None     Referring MD: Dorothyann Peng, NP   Chief Complaint  Patient presents with   Congestive Heart Failure   Shortness of Breath    History of Present Illness:    [name] [name] is a 77 y.o. male who is seen for follow up evaluation of dyspnea on exertion. He has a history of DM type 2, HTN, HLD and CKD. Prior CVA in 2019. Had ILR placed in 2019 with no arrhythmias noted to date. Echo and TEE in 2019 were normal. He has moderate carotid arterial disease followed by Vascular surgery in [place].  When seen in Dec  he complained of symptoms of SOB with exertion. Relieved with rest. If he walks far also notes his heart beats hard. No real chest pain. No palpitations or swelling. Is under a lot of stress at home since his daughter is quite ill.   To evaluate further we performed a Myoview study and Echo with results noted below. He has mild LV dysfunction with EF 40-45% and no significant perfusion abnormality.  On follow up today he still gets SOB with walking. Weight has gone up over the holidays but no edema. He is careful with his salt intake. BP is better. No chest pain.   Past Medical History:  Diagnosis Date   Anemia    Cataract    DIABETES MELLITUS, TYPE II 01/02/2007   HYPERLIPIDEMIA 06/18/2007   HYPERTENSION 01/02/2007   RENAL INSUFFICIENCY 02/05/2008   Stroke ([place]) 2019    Past Surgical History:  Procedure Laterality Date   LOOP RECORDER INSERTION N/A 07/11/2017   Procedure: LOOP RECORDER INSERTION;  Surgeon: Thompson Grayer, MD;  Location: [facility];  Service: Cardiovascular;  Laterality: N/A;   SHOULDER SURGERY     left   TEE WITHOUT CARDIOVERSION N/A 07/11/2017   Procedure: TRANSESOPHAGEAL ECHOCARDIOGRAM (TEE);  Surgeon: Dorothy Spark, MD;  Location: [facility] ENDOSCOPY;  Service:  Cardiovascular;  Laterality: N/A;   TRANSURETHRAL RESECTION OF PROSTATE     history of retention/hematuria     Current Medications: Current Meds  Medication Sig   amLODipine (NORVASC) 5 MG tablet Take 5 mg by mouth in the morning and at bedtime.   aspirin EC 81 MG tablet Take 1 tablet (81 mg total) by mouth daily. Swallow whole.   B Complex Vitamins (VITAMIN B COMPLEX) TABS Take by mouth.   Blood Glucose Monitoring Suppl (ONETOUCH VERIO) w/Device KIT Use with existing test strips   calcitRIOL (ROCALTROL) 0.25 MCG capsule Take 1 capsule by mouth every other day.   Cholecalciferol 50 MCG (2000 UT) CAPS Take 1 capsule by mouth daily.   ezetimibe (ZETIA) 10 MG tablet TAKE 1 TABLET(10 MG) BY MOUTH DAILY   ferrous sulfate 325 (65 FE) MG tablet Take 325 mg by mouth 2 (two) times daily with a meal.   hydrALAZINE (APRESOLINE) 100 MG tablet Take 100 mg by mouth 3 (three) times daily.   labetalol (NORMODYNE) 200 MG tablet TAKE 1 TABLET(200 MG) BY MOUTH THREE TIMES DAILY   latanoprost (XALATAN) 0.005 % ophthalmic solution 1 drop at bedtime.   nateglinide (STARLIX) 60 MG tablet TAKE 1 TABLET BY MOUTH ONCE A DAY WITH MEALS   ONETOUCH VERIO test strip USE TO TEST TWICE DAILY AS DIRECTED   timolol (TIMOPTIC) 0.5 %  ophthalmic solution 1 drop 2 (two) times daily.   valsartan (DIOVAN) 160 MG tablet Take 80 mg by mouth 2 (two) times daily.   [DISCONTINUED] aspirin 325 MG tablet Take 325 mg by mouth daily.     Allergies:   Evolocumab, 5-alpha reductase inhibitors, Bactrim [sulfamethoxazole-trimethoprim], Penicillins, Statins, Sulfa antibiotics, Sulfa drugs cross reactors, and Sulfamethoxazole-trimethoprim   Social History   Socioeconomic History   Marital status: Married    Spouse name: Not on file   Number of children: Not on file   Years of education: Not on file   Highest education level: Not on file  Occupational History   Not on file  Tobacco Use   Smoking status: Never   Smokeless tobacco:  Never  Vaping Use   Vaping Use: Never used  Substance and Sexual Activity   Alcohol use: No   Drug use: No   Sexual activity: Not on file  Other Topics Concern   Not on file  Social History Narrative   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 Likes to go to football games.    Social Determinants of Health   Financial Resource Strain: Low Risk  (02/14/2022)   Overall Financial Resource Strain (CARDIA)    Difficulty of Paying Living Expenses: Not hard at all  Food Insecurity: No Food Insecurity (06/08/2022)   Hunger Vital Sign    Worried About Running Out of Food in the Last Year: Never true    Ran Out of Food in the Last Year: Never true  Transportation Needs: No Transportation Needs (06/08/2022)   PRAPARE - Hydrologist (Medical): No    Lack of Transportation (Non-Medical): No  Physical Activity: Inactive (08/16/2021)   Exercise Vital Sign    Days of Exercise per Week: 0 days    Minutes of Exercise per Session: 0 min  Stress: No Stress Concern Present (08/16/2021)   [place]    Feeling of Stress : Only a little  Social Connections: Socially Integrated (08/16/2021)   Social Connection and Isolation Panel [NHANES]    Frequency of Communication with Friends and Family: More than three times a week    Frequency of Social Gatherings with Friends and Family: More than three times a week    Attends Religious Services: More than 4 times per year    Active Member of [employer] or Organizations: Yes    Attends Music therapist: More than 4 times per year    Marital Status: Married     Family History: The patient's family history includes Diabetes in his mother; Hypertension in his mother; Stroke in his father and maternal grandmother. There is no history of Colon cancer,  Esophageal cancer, Rectal cancer, or Stomach cancer.  ROS:   Please see the history of present illness.     All other systems reviewed and are negative.  EKGs/Labs/Other Studies Reviewed:    The following studies were reviewed today: Echo 07/09/17: Study Conclusions   - Left ventricle: The cavity size was mildly dilated. Wall    thickness was increased in a pattern of mild LVH. Systolic    function was normal. The estimated ejection fraction was in the    range of 55% to 60%. Wall motion was normal; there were no  regional wall motion abnormalities. Doppler parameters are    consistent with abnormal left ventricular relaxation (grade 1    diastolic dysfunction).  - Left atrium: The atrium was mildly dilated.   TEE 07/11/17: Study Conclusions   - Left ventricle: There was mild concentric hypertrophy. Systolic    function was normal. The estimated ejection fraction was in the    range of 55% to 60%. Wall motion was normal; there were no    regional wall motion abnormalities.  - Aorta: There was moderate non-mobile atheroma.  - Left atrium: The atrium was dilated. No evidence of thrombus in    the atrial cavity or appendage. No evidence of thrombus in the    atrial cavity or appendage. The appendage was morphologically a    left appendage, multilobulated, and of normal size. Emptying    velocity was normal.  - Right atrium: No evidence of thrombus in the atrial cavity or    appendage.  - Atrial septum: No defect or patent foramen ovale was identified.  - Tricuspid valve: There was mild regurgitation.   Impressions:   - No cardiac source of emboli was indentified.   Echo 07/05/22: IMPRESSIONS     1. Left ventricular ejection fraction, by estimation, is 45 to 50%. Left  ventricular ejection fraction by 3D volume is 45 %. The left ventricle has  mildly decreased function. The left ventricle demonstrates global  hypokinesis. The left ventricular  internal cavity size was mildly  dilated. Left ventricular diastolic  parameters are consistent with Grade II diastolic dysfunction  (pseudonormalization). Elevated left ventricular end-diastolic pressure.   2. Right ventricular systolic function is normal. The right ventricular  size is normal.   3. Left atrial size was severely dilated.   4. The mitral valve is normal in structure. Trivial mitral valve  regurgitation. No evidence of mitral stenosis.   5. The aortic valve is tricuspid. There is moderate calcification of the  aortic valve. There is moderate thickening of the aortic valve. Aortic  valve regurgitation is not visualized. Aortic valve  sclerosis/calcification is present, without any evidence  of aortic stenosis.   6. Aortic dilatation noted. There is mild dilatation of the ascending  aorta, measuring 38 mm.   7. The inferior vena cava is dilated in size with >50% respiratory  variability, suggesting right atrial pressure of 8 mmHg.   Myoview 06/09/22: Study Highlights      Findings are consistent with no prior ischemia. The study is intermediate risk.   No ST deviation was noted.   LV perfusion is abnormal. There is no evidence of ischemia. There is no evidence of infarction. Defect 1: There is a small defect with mild reduction in uptake present in the apical apex location(s) that is fixed. There is normal wall motion in the defect area. Consistent with artifact.   Left ventricular function is abnormal. Global function is mildly reduced. End diastolic cavity size is moderately enlarged. End systolic cavity size is moderately enlarged.   Prior study not available for comparison.   Small size, mild intensity fixed apical perfusion defect, c/w apical thinning artifact. No reversible ischemia. LVEF 47% with moderately dilated LV and global hypokinesis. This is an intermediate risk study. No prior study for comparison.      EKG:  EKG is not ordered today.  CHEST - 2 VIEW   COMPARISON:  January 25, 2019    FINDINGS: The heart size and mediastinal contours are stable. Both lungs are clear. The visualized skeletal  structures are stable.   IMPRESSION: No active cardiopulmonary disease.     Electronically Signed   By: Abelardo Diesel M.D.   On: 06/04/2022 12:30     Recent Labs: 03/16/2022: ALT 11; TSH 2.86 06/03/2022: BUN 43; Creatinine, Ser 3.09; Hemoglobin 11.8; Platelets 270.0; Potassium 3.6; Sodium 141  Recent Lipid Panel    Component Value Date/Time   CHOL 181 03/16/2022 0744   TRIG 135.0 03/16/2022 0744   TRIG 323 (HH) 06/13/2006 1050   HDL 41.60 03/16/2022 0744   CHOLHDL 4 03/16/2022 0744   VLDL 27.0 03/16/2022 0744   LDLCALC 113 (H) 03/16/2022 0744   LDLCALC 98 01/15/2020 0811   LDLDIRECT 180.7 06/24/2013 1106   Labs dated 06/14/22: BUN 40, creatinine 2.98. GFR 22. Otherwise CMET normal. PTH 177. Phosphorus 3.3. UA normal.   Physical Exam:    VS:  BP 138/72 (BP Location: Left Arm, Patient Position: Sitting, Cuff Size: Large)   Pulse 84   Ht '6\' 3"'$  (1.905 m)   Wt 232 lb 12.8 oz (105.6 kg)   SpO2 95%   BMI 29.10 kg/m     Wt Readings from Last 3 Encounters:  07/15/22 232 lb 12.8 oz (105.6 kg)  06/09/22 227 lb (103 kg)  06/09/22 227 lb (103 kg)     GEN:  Well nourished, well developed in no acute distress HEENT: Normal NECK: No JVD; No carotid bruits LYMPHATICS: No lymphadenopathy CARDIAC: RRR, no murmurs, rubs, gallops RESPIRATORY:  Clear to auscultation without rales, wheezing or rhonchi  ABDOMEN: Soft, non-tender, non-distended MUSCULOSKELETAL:  No edema; No deformity  SKIN: Warm and dry NEUROLOGIC:  Alert and oriented x 3 PSYCHIATRIC:  Normal affect   ASSESSMENT:    1. Dyspnea on exertion   2. Primary hypertension   3. CKD (chronic kidney disease) stage 4, GFR 15-29 ml/min (HCC)   4. Type 2 diabetes mellitus with complication, with long-term current use of insulin ([place])   5. Cerebrovascular accident (CVA), unspecified mechanism ([place])     PLAN:     In order of problems listed above:  DOE. He does have mild LV dysfunction given longstanding DM and HTN.  No significant perfusion abnormality on Myoview so I don't think this is ischemic. Would try and avoid contrast studies given CKD.  He does not appear to be volume overloaded at this time. If he were to develop increased edema would need to consider a diuretic but this may result in worsened renal function. He is on labetalol, valsartan, hydralazine. Not a candidate for aldactone or SGLT 2 inhibitor with his renal function.  HTN control is improved today on multiple medications.  DM type 2 on insulin HLD - on Zetia. Intolerant to multiple meds Carotid arterial disease followed by vascular surgery in [place] CKD stage 4 History of prior CVA 4 years ago- recommend reducing ASA to 81 mg daily.       Follow up in 4-6 months with APP  Medication Adjustments/Labs and Tests Ordered: Current medicines are reviewed at length with the patient today.  Concerns regarding medicines are outlined above.  No orders of the defined types were placed in this encounter.  Meds ordered this encounter  Medications   aspirin EC 81 MG tablet    Sig: Take 1 tablet (81 mg total) by mouth daily. Swallow whole.    Dispense:  90 tablet    Refill:  3    There are no Patient Instructions on file for this visit.   Signed, Emaline Karnes [place],  MD  07/15/2022 8:41 AM    [place]

## 2022-07-11 DIAGNOSIS — H34212 Partial retinal artery occlusion, left eye: Secondary | ICD-10-CM | POA: Diagnosis not present

## 2022-07-11 DIAGNOSIS — H40023 Open angle with borderline findings, high risk, bilateral: Secondary | ICD-10-CM | POA: Diagnosis not present

## 2022-07-11 DIAGNOSIS — H1045 Other chronic allergic conjunctivitis: Secondary | ICD-10-CM | POA: Diagnosis not present

## 2022-07-11 DIAGNOSIS — H0102B Squamous blepharitis left eye, upper and lower eyelids: Secondary | ICD-10-CM | POA: Diagnosis not present

## 2022-07-11 DIAGNOSIS — H353131 Nonexudative age-related macular degeneration, bilateral, early dry stage: Secondary | ICD-10-CM | POA: Diagnosis not present

## 2022-07-11 DIAGNOSIS — H0102A Squamous blepharitis right eye, upper and lower eyelids: Secondary | ICD-10-CM | POA: Diagnosis not present

## 2022-07-11 DIAGNOSIS — H35033 Hypertensive retinopathy, bilateral: Secondary | ICD-10-CM | POA: Diagnosis not present

## 2022-07-11 DIAGNOSIS — H2513 Age-related nuclear cataract, bilateral: Secondary | ICD-10-CM | POA: Diagnosis not present

## 2022-07-11 DIAGNOSIS — H11123 Conjunctival concretions, bilateral: Secondary | ICD-10-CM | POA: Diagnosis not present

## 2022-07-15 ENCOUNTER — Ambulatory Visit: Payer: Medicare Other | Attending: Cardiology | Admitting: Cardiology

## 2022-07-15 ENCOUNTER — Encounter: Payer: Self-pay | Admitting: Cardiology

## 2022-07-15 VITALS — BP 138/72 | HR 84 | Ht 75.0 in | Wt 232.8 lb

## 2022-07-15 DIAGNOSIS — E118 Type 2 diabetes mellitus with unspecified complications: Secondary | ICD-10-CM | POA: Diagnosis not present

## 2022-07-15 DIAGNOSIS — Z794 Long term (current) use of insulin: Secondary | ICD-10-CM | POA: Diagnosis not present

## 2022-07-15 DIAGNOSIS — T466X5A Adverse effect of antihyperlipidemic and antiarteriosclerotic drugs, initial encounter: Secondary | ICD-10-CM

## 2022-07-15 DIAGNOSIS — I639 Cerebral infarction, unspecified: Secondary | ICD-10-CM

## 2022-07-15 DIAGNOSIS — N184 Chronic kidney disease, stage 4 (severe): Secondary | ICD-10-CM | POA: Diagnosis not present

## 2022-07-15 DIAGNOSIS — G720 Drug-induced myopathy: Secondary | ICD-10-CM | POA: Diagnosis not present

## 2022-07-15 DIAGNOSIS — R0609 Other forms of dyspnea: Secondary | ICD-10-CM

## 2022-07-15 DIAGNOSIS — I10 Essential (primary) hypertension: Secondary | ICD-10-CM

## 2022-07-15 MED ORDER — ASPIRIN 81 MG PO TBEC: 81.0000 mg | DELAYED_RELEASE_TABLET | Freq: Every day | ORAL | 3 refills | Status: DC

## 2022-08-11 ENCOUNTER — Ambulatory Visit (INDEPENDENT_AMBULATORY_CARE_PROVIDER_SITE_OTHER): Payer: Medicare Other | Admitting: Adult Health

## 2022-08-11 ENCOUNTER — Encounter: Payer: Self-pay | Admitting: Adult Health

## 2022-08-11 VITALS — BP 160/92 | HR 70 | Temp 98.0°F | Ht 75.0 in | Wt 230.0 lb

## 2022-08-11 DIAGNOSIS — N184 Chronic kidney disease, stage 4 (severe): Secondary | ICD-10-CM

## 2022-08-11 DIAGNOSIS — I129 Hypertensive chronic kidney disease with stage 1 through stage 4 chronic kidney disease, or unspecified chronic kidney disease: Secondary | ICD-10-CM | POA: Diagnosis not present

## 2022-08-11 DIAGNOSIS — Z794 Long term (current) use of insulin: Secondary | ICD-10-CM

## 2022-08-11 DIAGNOSIS — E1122 Type 2 diabetes mellitus with diabetic chronic kidney disease: Secondary | ICD-10-CM

## 2022-08-11 LAB — POCT GLYCOSYLATED HEMOGLOBIN (HGB A1C): Hemoglobin A1C: 7.1 % — AB (ref 4.0–5.6)

## 2022-08-11 MED ORDER — METHYLPREDNISOLONE ACETATE 80 MG/ML IJ SUSP: 80.0000 mg | Freq: Once | INTRAMUSCULAR | Status: DC

## 2022-08-11 NOTE — Patient Instructions (Signed)
Your A1c was 7.1 - this is up from 6.6   I am going to put you back on Lantus 3 units at night time.   Follow up in 3 months

## 2022-08-11 NOTE — Progress Notes (Signed)
Subjective:    Patient ID: [name] [name], male    DOB: [birth year], 77 y.o.   MRN: [id number]  HPI 77 year old male who  has a past medical history of Anemia, Cataract, DIABETES MELLITUS, TYPE II (01/02/2007), HYPERLIPIDEMIA (06/18/2007), HYPERTENSION (01/02/2007), RENAL INSUFFICIENCY (02/05/2008), and Stroke ([place]) (2019).  He presents to the office today for three month follow up regarding DM and HTN   Diabetes mellitus type 2-managed with Starlix 60 mg daily and Lantus 9 units in the morning. During his last visit we d/c his evening lantus of 3 units. Since this change he reports that his blood sugars have been in the 140's.  Lab Results  Component Value Date   HGBA1C 7.1 (A) 08/11/2022   Hypertension with chronic kidney disease stage IV-managed by nephrology.  He is currently prescribed hydralazine 50 mg 3 times daily, Norvasc 5 mg daily, valsartan 160 mg daily, and labetalol 200 mg 3 times daily.  He does check his blood pressure at home with readings and they report readings in the 170's in the mornings and 120's in the evening. He does have an appointment with nephrology next month  BP Readings from Last 3 Encounters:  08/11/22 (!) 160/92  07/15/22 138/72  06/08/22 (!) 155/79   Review of Systems See HPI   Past Medical History:  Diagnosis Date   Anemia    Cataract    DIABETES MELLITUS, TYPE II 01/02/2007   HYPERLIPIDEMIA 06/18/2007   HYPERTENSION 01/02/2007   RENAL INSUFFICIENCY 02/05/2008   Stroke ([place]) 2019    Social History   Socioeconomic History   Marital status: Married    Spouse name: Not on file   Number of children: Not on file   Years of education: Not on file   Highest education level: Not on file  Occupational History   Not on file  Tobacco Use   Smoking status: Never   Smokeless tobacco: Never  Vaping Use   Vaping Use: Never used  Substance and Sexual Activity   Alcohol use: No   Drug use: No   Sexual activity: Not on file  Other Topics  Concern   Not on file  Social History Narrative   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 Likes to go to football games.    Social Determinants of Health   Financial Resource Strain: Low Risk  (02/14/2022)   Overall Financial Resource Strain (CARDIA)    Difficulty of Paying Living Expenses: Not hard at all  Food Insecurity: No Food Insecurity (06/08/2022)   Hunger Vital Sign    Worried About Running Out of Food in the Last Year: Never true    Ran Out of Food in the Last Year: Never true  Transportation Needs: No Transportation Needs (06/08/2022)   PRAPARE - Hydrologist (Medical): No    Lack of Transportation (Non-Medical): No  Physical Activity: Inactive (08/16/2021)   Exercise Vital Sign    Days of Exercise per Week: 0 days    Minutes of Exercise per Session: 0 min  Stress: No Stress Concern Present (08/16/2021)   [place]    Feeling of Stress : Only a little  Social Connections: Socially Integrated (08/16/2021)   Social Connection and Isolation Panel [NHANES]    Frequency of  Communication with Friends and Family: More than three times a week    Frequency of Social Gatherings with Friends and Family: More than three times a week    Attends Religious Services: More than 4 times per year    Active Member of Clubs or Organizations: Yes    Attends Archivist Meetings: More than 4 times per year    Marital Status: Married  Human resources officer Violence: Not At Risk (08/16/2021)   Humiliation, Afraid, Rape, and Kick questionnaire    Fear of Current or Ex-Partner: No    Emotionally Abused: No    Physically Abused: No    Sexually Abused: No    Past Surgical History:  Procedure Laterality Date   LOOP RECORDER INSERTION N/A 07/11/2017   Procedure: LOOP RECORDER INSERTION;   Surgeon: Thompson Grayer, MD;  Location: [facility];  Service: Cardiovascular;  Laterality: N/A;   SHOULDER SURGERY     left   TEE WITHOUT CARDIOVERSION N/A 07/11/2017   Procedure: TRANSESOPHAGEAL ECHOCARDIOGRAM (TEE);  Surgeon: Dorothy Spark, MD;  Location: [facility]OSCOPY;  Service: Cardiovascular;  Laterality: N/A;   TRANSURETHRAL RESECTION OF PROSTATE     history of retention/hematuria     Family History  Problem Relation Age of Onset   Hypertension Mother    Diabetes Mother    Stroke Father    Stroke Maternal Grandmother    Colon cancer Neg Hx    Esophageal cancer Neg Hx    Rectal cancer Neg Hx    Stomach cancer Neg Hx     Allergies  Allergen Reactions   Evolocumab Hypertension    Other reaction(s): Other (see comments)   5-Alpha Reductase Inhibitors Other (See Comments)    myalgia Other reaction(s): Other (see comments)   Bactrim [Sulfamethoxazole-Trimethoprim] Hives, Itching and Swelling   Penicillins Hives    Has patient had a PCN reaction causing immediate rash, facial/tongue/throat swelling, SOB or lightheadedness with hypotension: Unk Has patient had a PCN reaction causing severe rash involving mucus membranes or skin necrosis: Unk Has patient had a PCN reaction that required hospitalization: Unk Has patient had a PCN reaction occurring within the last 10 years: No If all of the above answers are "NO", then may proceed with Cephalosporin use. Other reaction(s): Other (see comments)   Statins     myalgia Other reaction(s): Other (see comments)   Sulfa Antibiotics     Other reaction(s): Other (see comments)   Sulfa Drugs Cross Reactors Hives, Itching and Swelling   Sulfamethoxazole-Trimethoprim Hives and Itching    Other reaction(s): Other (see comments)    Current Outpatient Medications on File Prior to Visit  Medication Sig Dispense Refill   amLODipine (NORVASC) 5 MG tablet Take 5 mg by mouth in the morning and at bedtime.     aspirin EC 81 MG tablet  Take 1 tablet (81 mg total) by mouth daily. Swallow whole. 90 tablet 3   B Complex Vitamins (VITAMIN B COMPLEX) TABS Take by mouth.     Blood Glucose Monitoring Suppl (ONETOUCH VERIO) w/Device KIT Use with existing test strips 1 kit kit   brimonidine (ALPHAGAN) 0.2 % ophthalmic solution SMARTSIG:In Eye(s)     calcitRIOL (ROCALTROL) 0.25 MCG capsule Take 1 capsule by mouth every other day.     Cholecalciferol 50 MCG (2000 UT) CAPS Take 1 capsule by mouth daily.     ezetimibe (ZETIA) 10 MG tablet TAKE 1 TABLET(10 MG) BY MOUTH DAILY 90 tablet 3   ferrous sulfate 325 (65  FE) MG tablet Take 325 mg by mouth 2 (two) times daily with a meal.     hydrALAZINE (APRESOLINE) 100 MG tablet Take 100 mg by mouth 3 (three) times daily.     labetalol (NORMODYNE) 200 MG tablet TAKE 1 TABLET(200 MG) BY MOUTH THREE TIMES DAILY 270 tablet 3   latanoprost (XALATAN) 0.005 % ophthalmic solution 1 drop at bedtime.     nateglinide (STARLIX) 60 MG tablet TAKE 1 TABLET BY MOUTH ONCE A DAY WITH MEALS     ONETOUCH VERIO test strip USE TO TEST TWICE DAILY AS DIRECTED 200 strip 2   timolol (TIMOPTIC) 0.5 % ophthalmic solution 1 drop 2 (two) times daily.     valsartan (DIOVAN) 160 MG tablet Take 80 mg by mouth 2 (two) times daily.     insulin glargine (LANTUS SOLOSTAR) 100 UNIT/ML Solostar Pen Inject 9 Units into the skin 2 (two) times daily. (Patient taking differently: Inject 9 Units into the skin 2 (two) times daily. 9 units in the morning and 3 units in the evening) 15 mL 0   No current facility-administered medications on file prior to visit.    BP (!) 160/92   Pulse 70   Temp 98 F (36.7 C) (Oral)   Ht '6\' 3"'$  (1.905 m)   Wt 230 lb (104.3 kg)   SpO2 99%   BMI 28.75 kg/m       Objective:   Physical Exam Vitals and nursing note reviewed.  Constitutional:      Appearance: Normal appearance.  Cardiovascular:     Rate and Rhythm: Normal rate and regular rhythm.     Pulses: Normal pulses.     Heart sounds:  Normal heart sounds.  Pulmonary:     Effort: Pulmonary effort is normal.     Breath sounds: Normal breath sounds.  Skin:    General: Skin is warm and dry.  Neurological:     General: No focal deficit present.     Mental Status: He is alert and oriented to person, place, and time.  Psychiatric:        Mood and Affect: Mood normal.        Behavior: Behavior normal.        Thought Content: Thought content normal.        Judgment: Judgment normal.        Assessment & Plan:  1. Type 2 diabetes mellitus with stage 4 chronic kidney disease, with long-term current use of insulin (HCC)  - POC HgB A1c- 7.1  - Will add back lantus 3 units QHS   2. Hypertensive kidney disease - elevated today. Follow up with nephrology   Dorothyann Peng, NP

## 2022-08-17 ENCOUNTER — Ambulatory Visit (INDEPENDENT_AMBULATORY_CARE_PROVIDER_SITE_OTHER): Payer: Medicare Other

## 2022-08-17 VITALS — Ht 75.0 in | Wt 230.0 lb

## 2022-08-17 DIAGNOSIS — Z0000 Encounter for general adult medical examination without abnormal findings: Secondary | ICD-10-CM

## 2022-08-17 NOTE — Patient Instructions (Addendum)
[name] [name] , Thank you for taking time to come for your Medicare Wellness Visit. I appreciate your ongoing commitment to your health goals. Please review the following plan we discussed and let me know if I can assist you in the future.   These are the goals we discussed:  Goals       Exercise 3x per week (30 min per time)      Patient stated (pt-stated)      To Get healthy.      Weight (lb) < 220 lb (99.8 kg)        This is a list of the screening recommended for you and due dates:  Health Maintenance  Topic Date Due   DTaP/Tdap/Td vaccine (2 - Tdap) 11/14/2019   COVID-19 Vaccine (4 - 2023-24 season) 09/02/2022*   Flu Shot  10/02/2022*   Zoster (Shingles) Vaccine (1 of 2) 11/15/2022*   Yearly kidney function blood test for diabetes  06/04/2023   Yearly kidney health urinalysis for diabetes  06/04/2023   Medicare Annual Wellness Visit  08/18/2023   Cologuard (Stool DNA test)  10/05/2024   Pneumonia Vaccine  Completed   Hepatitis C Screening: USPSTF Recommendation to screen - Ages 14-79 yo.  Completed   HPV Vaccine  Aged Out  *Topic was postponed. The date shown is not the original due date.    Advanced directives: Please bring a copy of your health care power of attorney and living will to the office to be added to your chart at your convenience.   Conditions/risks identified: None  Next appointment: Follow up in one year for your annual wellness visit.    Preventive Care 24 Years and Older, Male  Preventive care refers to lifestyle choices and visits with your health care provider that can promote health and wellness. What does preventive care include? A yearly physical exam. This is also called an annual well check. Dental exams once or twice a year. Routine eye exams. Ask your health care provider how often you should have your eyes checked. Personal lifestyle choices, including: Daily care of your teeth and gums. Regular physical activity. Eating a healthy  diet. Avoiding tobacco and drug use. Limiting alcohol use. Practicing safe sex. Taking low doses of aspirin every day. Taking vitamin and mineral supplements as recommended by your health care provider. What happens during an annual well check? The services and screenings done by your health care provider during your annual well check will depend on your age, overall health, lifestyle risk factors, and family history of disease. Counseling  Your health care provider may ask you questions about your: Alcohol use. Tobacco use. Drug use. Emotional well-being. Home and relationship well-being. Sexual activity. Eating habits. History of falls. Memory and ability to understand (cognition). Work and work Statistician. Screening  You may have the following tests or measurements: Height, weight, and BMI. Blood pressure. Lipid and cholesterol levels. These may be checked every 5 years, or more frequently if you are over 86 years old. Skin check. Lung cancer screening. You may have this screening every year starting at age 43 if you have a 30-pack-year history of smoking and currently smoke or have quit within the past 15 years. Fecal occult blood test (FOBT) of the stool. You may have this test every year starting at age 52. Flexible sigmoidoscopy or colonoscopy. You may have a sigmoidoscopy every 5 years or a colonoscopy every 10 years starting at age 18. Prostate cancer screening. Recommendations will vary depending on your  family history and other risks. Hepatitis C blood test. Hepatitis B blood test. Sexually transmitted disease (STD) testing. Diabetes screening. This is done by checking your blood sugar (glucose) after you have not eaten for a while (fasting). You may have this done every 1-3 years. Abdominal aortic aneurysm (AAA) screening. You may need this if you are a current or former smoker. Osteoporosis. You may be screened starting at age 19 if you are at high risk. Talk with  your health care provider about your test results, treatment options, and if necessary, the need for more tests. Vaccines  Your health care provider may recommend certain vaccines, such as: Influenza vaccine. This is recommended every year. Tetanus, diphtheria, and acellular pertussis (Tdap, Td) vaccine. You may need a Td booster every 10 years. Zoster vaccine. You may need this after age 50. Pneumococcal 13-valent conjugate (PCV13) vaccine. One dose is recommended after age 49. Pneumococcal polysaccharide (PPSV23) vaccine. One dose is recommended after age 39. Talk to your health care provider about which screenings and vaccines you need and how often you need them. This information is not intended to replace advice given to you by your health care provider. Make sure you discuss any questions you have with your health care provider. Document Released: 07/17/2015 Document Revised: 03/09/2016 Document Reviewed: 04/21/2015 Elsevier Interactive Patient Education  2017 [place] Prevention in the Home Falls can cause injuries. They can happen to people of all ages. There are many things you can do to make your home safe and to help prevent falls. What can I do on the outside of my home? Regularly fix the edges of walkways and driveways and fix any cracks. Remove anything that might make you trip as you walk through a door, such as a raised step or threshold. Trim any bushes or trees on the path to your home. Use bright outdoor lighting. Clear any walking paths of anything that might make someone trip, such as rocks or tools. Regularly check to see if handrails are loose or broken. Make sure that both sides of any steps have handrails. Any raised decks and porches should have guardrails on the edges. Have any leaves, snow, or ice cleared regularly. Use sand or salt on walking paths during winter. Clean up any spills in your garage right away. This includes oil or grease spills. What  can I do in the bathroom? Use night lights. Install grab bars by the toilet and in the tub and shower. Do not use towel bars as grab bars. Use non-skid mats or decals in the tub or shower. If you need to sit down in the shower, use a plastic, non-slip stool. Keep the floor dry. Clean up any water that spills on the floor as soon as it happens. Remove soap buildup in the tub or shower regularly. Attach bath mats securely with double-sided non-slip rug tape. Do not have throw rugs and other things on the floor that can make you trip. What can I do in the bedroom? Use night lights. Make sure that you have a light by your bed that is easy to reach. Do not use any sheets or blankets that are too big for your bed. They should not hang down onto the floor. Have a firm chair that has side arms. You can use this for support while you get dressed. Do not have throw rugs and other things on the floor that can make you trip. What can I do in the kitchen? Clean up  any spills right away. Avoid walking on wet floors. Keep items that you use a lot in easy-to-reach places. If you need to reach something above you, use a strong step stool that has a grab bar. Keep electrical cords out of the way. Do not use floor polish or wax that makes floors slippery. If you must use wax, use non-skid floor wax. Do not have throw rugs and other things on the floor that can make you trip. What can I do with my stairs? Do not leave any items on the stairs. Make sure that there are handrails on both sides of the stairs and use them. Fix handrails that are broken or loose. Make sure that handrails are as long as the stairways. Check any carpeting to make sure that it is firmly attached to the stairs. Fix any carpet that is loose or worn. Avoid having throw rugs at the top or bottom of the stairs. If you do have throw rugs, attach them to the floor with carpet tape. Make sure that you have a light switch at the top of the  stairs and the bottom of the stairs. If you do not have them, ask someone to add them for you. What else can I do to help prevent falls? Wear shoes that: Do not have high heels. Have rubber bottoms. Are comfortable and fit you well. Are closed at the toe. Do not wear sandals. If you use a stepladder: Make sure that it is fully opened. Do not climb a closed stepladder. Make sure that both sides of the stepladder are locked into place. Ask someone to hold it for you, if possible. Clearly mark and make sure that you can see: Any grab bars or handrails. First and last steps. Where the edge of each step is. Use tools that help you move around (mobility aids) if they are needed. These include: Canes. Walkers. Scooters. Crutches. Turn on the lights when you go into a dark area. Replace any light bulbs as soon as they burn out. Set up your furniture so you have a clear path. Avoid moving your furniture around. If any of your floors are uneven, fix them. If there are any pets around you, be aware of where they are. Review your medicines with your doctor. Some medicines can make you feel dizzy. This can increase your chance of falling. Ask your doctor what other things that you can do to help prevent falls. This information is not intended to replace advice given to you by your health care provider. Make sure you discuss any questions you have with your health care provider. Document Released: 04/16/2009 Document Revised: 11/26/2015 Document Reviewed: 07/25/2014 Elsevier Interactive Patient Education  2017 [employer].

## 2022-08-17 NOTE — Progress Notes (Signed)
Subjective:   [name] [name] is a 77 y.o. male who presents for Medicare Annual/Subsequent preventive examination.  Review of Systems    Virtual Visit via Telephone Note  I connected with  [name] [name] on 08/17/22 at  9:45 AM EST by telephone and verified that I am speaking with the correct person using two identifiers.  Location: Patient: Home Provider: Office Persons participating in the virtual visit: patient/Nurse Health Advisor   I discussed the limitations, risks, security and privacy concerns of performing an evaluation and management service by telephone and the availability of in person appointments. The patient expressed understanding and agreed to proceed.  Interactive audio and video telecommunications were attempted between this nurse and patient, however failed, due to patient having technical difficulties OR patient did not have access to video capability.  We continued and completed visit with audio only.  Some vital signs may be absent or patient reported.   [name], LPN  Cardiac Risk Factors include: advanced age (>36mn, >>30women);diabetes mellitus;male gender;hypertension     Objective:    Today's Vitals   08/17/22 1000  Weight: 230 lb (104.3 kg)  Height: 6' 3"$  (1.905 m)   Body mass index is 28.75 kg/m.     08/17/2022   10:14 AM 06/08/2022    3:10 PM 01/05/2022    9:00 AM 08/16/2021    9:54 AM 08/13/2020    9:11 AM 08/07/2020    9:40 AM 02/18/2020   10:04 AM  Advanced Directives  Does Patient Have a Medical Advance Directive? Yes Yes Yes Yes Yes Yes Yes  Type of AParamedicof AF[place] will Living will;Healthcare Power of Attorney Living will;Healthcare Power of [place] CityLiving will [place]Living will [place] will   Does patient want to make changes to medical advance directive?    No - Patient declined No - Patient declined    Copy  of HIolain Chart? No - copy requested  No - copy requested No - copy requested No - copy requested      Current Medications (verified) Outpatient Encounter Medications as of 08/17/2022  Medication Sig   amLODipine (NORVASC) 5 MG tablet Take 5 mg by mouth in the morning and at bedtime.   aspirin EC 81 MG tablet Take 1 tablet (81 mg total) by mouth daily. Swallow whole.   B Complex Vitamins (VITAMIN B COMPLEX) TABS Take by mouth.   Blood Glucose Monitoring Suppl (ONETOUCH VERIO) w/Device KIT Use with existing test strips   brimonidine (ALPHAGAN) 0.2 % ophthalmic solution SMARTSIG:In Eye(s)   calcitRIOL (ROCALTROL) 0.25 MCG capsule Take 1 capsule by mouth every other day.   Cholecalciferol 50 MCG (2000 UT) CAPS Take 1 capsule by mouth daily.   ezetimibe (ZETIA) 10 MG tablet TAKE 1 TABLET(10 MG) BY MOUTH DAILY   ferrous sulfate 325 (65 FE) MG tablet Take 325 mg by mouth 2 (two) times daily with a meal.   hydrALAZINE (APRESOLINE) 100 MG tablet Take 100 mg by mouth 3 (three) times daily.   insulin glargine (LANTUS SOLOSTAR) 100 UNIT/ML Solostar Pen Inject 9 Units into the skin 2 (two) times daily. (Patient taking differently: Inject 9 Units into the skin 2 (two) times daily. 9 units in the morning and 3 units in the evening)   labetalol (NORMODYNE) 200 MG tablet TAKE 1 TABLET(200 MG) BY MOUTH THREE TIMES DAILY   latanoprost (XALATAN) 0.005 % ophthalmic solution 1  drop at bedtime.   nateglinide (STARLIX) 60 MG tablet TAKE 1 TABLET BY MOUTH ONCE A DAY WITH MEALS   ONETOUCH VERIO test strip USE TO TEST TWICE DAILY AS DIRECTED   timolol (TIMOPTIC) 0.5 % ophthalmic solution 1 drop 2 (two) times daily.   valsartan (DIOVAN) 160 MG tablet Take 80 mg by mouth 2 (two) times daily.   No facility-administered encounter medications on file as of 08/17/2022.    Allergies (verified) Evolocumab, 5-alpha reductase inhibitors, Bactrim [sulfamethoxazole-trimethoprim], Penicillins, Statins,  Sulfa antibiotics, Sulfa drugs cross reactors, and Sulfamethoxazole-trimethoprim   History: Past Medical History:  Diagnosis Date   Anemia    Cataract    DIABETES MELLITUS, TYPE II 01/02/2007   HYPERLIPIDEMIA 06/18/2007   HYPERTENSION 01/02/2007   RENAL INSUFFICIENCY 02/05/2008   Stroke ([place]) 2019   Past Surgical History:  Procedure Laterality Date   LOOP RECORDER INSERTION N/A 07/11/2017   Procedure: LOOP RECORDER INSERTION;  Surgeon: Thompson Grayer, MD;  Location: [facility];  Service: Cardiovascular;  Laterality: N/A;   SHOULDER SURGERY     left   TEE WITHOUT CARDIOVERSION N/A 07/11/2017   Procedure: TRANSESOPHAGEAL ECHOCARDIOGRAM (TEE);  Surgeon: Dorothy Spark, MD;  Location: [facility] ENDOSCOPY;  Service: Cardiovascular;  Laterality: N/A;   TRANSURETHRAL RESECTION OF PROSTATE     history of retention/hematuria    Family History  Problem Relation Age of Onset   Hypertension Mother    Diabetes Mother    Stroke Father    Stroke Maternal Grandmother    Colon cancer Neg Hx    Esophageal cancer Neg Hx    Rectal cancer Neg Hx    Stomach cancer Neg Hx    Social History   Socioeconomic History   Marital status: Married    Spouse name: Not on file   Number of children: Not on file   Years of education: Not on file   Highest education level: Not on file  Occupational History   Not on file  Tobacco Use   Smoking status: Never   Smokeless tobacco: Never  Vaping Use   Vaping Use: Never used  Substance and Sexual Activity   Alcohol use: No   Drug use: No   Sexual activity: Not on file  Other Topics Concern   Not on file  Social History Narrative   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 Likes to go to football games.    Social Determinants of Health   Financial Resource Strain: Low Risk  (02/14/2022)   Overall Financial Resource Strain (CARDIA)     Difficulty of Paying Living Expenses: Not hard at all  Food Insecurity: No Food Insecurity (08/17/2022)   Hunger Vital Sign    Worried About Running Out of Food in the Last Year: Never true    Ran Out of Food in the Last Year: Never true  Transportation Needs: No Transportation Needs (08/17/2022)   PRAPARE - Hydrologist (Medical): No    Lack of Transportation (Non-Medical): No  Physical Activity: Inactive (08/17/2022)   Exercise Vital Sign    Days of Exercise per Week: 0 days    Minutes of Exercise per Session: 0 min  Stress: No Stress Concern Present (08/17/2022)   [place]    Feeling of Stress :  Not at all  Social Connections: Socially Integrated (08/17/2022)   Social Connection and Isolation Panel [NHANES]    Frequency of Communication with Friends and Family: More than three times a week    Frequency of Social Gatherings with Friends and Family: More than three times a week    Attends Religious Services: More than 4 times per year    Active Member of [employer] or Organizations: Yes    Attends Music therapist: More than 4 times per year    Marital Status: Married    Tobacco Counseling Counseling given: Not Answered   Clinical Intake:  Pre-visit preparation completed: Yes  Pain : No/denies pain     BMI - recorded: 28.75 Nutritional Status: BMI 25 -29 Overweight Nutritional Risks: None Diabetes: Yes CBG done?: Yes (CBG 161 Taken by patient) CBG resulted in Enter/ Edit results?: Yes Did pt. bring in CBG monitor from home?: NoNutrition Risk Assessment:  Has the patient had any N/V/D within the last 2 months?  No  Does the patient have any non-healing wounds?  No  Has the patient had any unintentional weight loss or weight gain?  No   Diabetes:  Is the patient diabetic?  Yes  If diabetic, was a CBG obtained today?  Yes CBG 161 Taken by patient Did the patient bring  in their glucometer from home?  No  How often do you monitor your CBG's? 2 X Daily.   Financial Strains and Diabetes Management:  Are you having any financial strains with the device, your supplies or your medication? No .  Does the patient want to be seen by Chronic Care Management for management of their diabetes?  No  Would the patient like to be referred to a Nutritionist or for Diabetic Management?  No   Diabetic Exams:  Diabetic Eye Exam: Completed No. Overdue for diabetic eye exam. Pt has been advised about the importance in completing this exam. A referral has been placed today. Message sent to referral coordinator for scheduling purposes. Advised pt to expect a call from office referred to regarding appt.  Diabetic Foot Exam: Completed No. Pt has been advised about the importance in completing this exam. Pt is scheduled for diabetic foot exam on  Followed by PCP.    How often do you need to have someone help you when you read instructions, pamphlets, or other written materials from your doctor or pharmacy?: 1 - Never  Diabetic?  Yes  Interpreter Needed?: No  Information entered by :: Rolene Arbour LPN   Activities of Daily Living    08/17/2022   10:07 AM  In your present state of health, do you have any difficulty performing the following activities:  Hearing? 0  Vision? 0  Difficulty concentrating or making decisions? 0  Walking or climbing stairs? 1  Comment Due to Rt Knee Issues  Dressing or bathing? 0  Doing errands, shopping? 0  Preparing Food and eating ? N  Using the Toilet? N  In the past six months, have you accidently leaked urine? N  Do you have problems with loss of bowel control? N  Managing your Medications? N  Managing your Finances? N  Housekeeping or managing your Housekeeping? N    Patient Care Team: Dorothyann Peng, NP as PCP - General (Family Medicine) Adegoroye, Wynona Luna, MD (Nephrology) Viona Gilmore, [place] (Inactive) as Pharmacist  (Pharmacist)  Indicate any recent Medical Services you may have received from other than [facility] providers in the past year (date  may be approximate).     Assessment:   This is a routine wellness examination for [facility].  Hearing/Vision screen Hearing Screening - Comments:: Denies hearing difficulties   Vision Screening - Comments:: Wears rx glasses - up to date with routine eye exams with  Dr Katy Fitch  Dietary issues and exercise activities discussed: Current Exercise Habits: The patient does not participate in regular exercise at present, Exercise limited by: orthopedic condition(s)   Goals Addressed               This Visit's Progress     Patient stated (pt-stated)        To Get healthy.       Depression Screen    08/17/2022   10:06 AM 11/16/2021    7:09 AM 08/19/2021    7:00 AM 08/16/2021    9:50 AM 08/13/2020    9:13 AM 01/08/2020   10:00 AM 01/08/2020    8:37 AM  PHQ 2/9 Scores  PHQ - 2 Score 0 2 3 1 $ 0 0 0  PHQ- 9 Score  10 13        Fall Risk    08/17/2022   10:09 AM 11/16/2021    7:08 AM 08/16/2021    9:53 AM 08/13/2020    9:12 AM 01/08/2020   10:00 AM  Fall Risk   Falls in the past year? 1 1 0 0 1  Number falls in past yr: 0 0 0 0 0  Injury with Fall? 1 0 0 0 0  Comment Fx knee, followed by Orthopedic      Risk for fall due to : No Fall Risks History of fall(s) No Fall Risks No Fall Risks   Follow up Falls prevention discussed Falls evaluation completed  Falls evaluation completed;Falls prevention discussed     FALL RISK PREVENTION PERTAINING TO THE HOME:  Any stairs in or around the home? Yes  If so, are there any without handrails? No  Home free of loose throw rugs in walkways, pet beds, electrical cords, etc? Yes  Adequate lighting in your home to reduce risk of falls? Yes   ASSISTIVE DEVICES UTILIZED TO PREVENT FALLS:  Life alert? No  Use of a cane, walker or w/c? No  Grab bars in the bathroom? No  Shower chair or bench in shower? No  Elevated toilet  seat or a handicapped toilet? No   TIMED UP AND GO:  Was the test performed? No . Audio Visit  Cognitive Function:        08/17/2022   10:14 AM 08/16/2021    9:55 AM  6CIT Screen  What Year? 0 points 0 points  What month? 0 points 0 points  What time? 0 points 0 points  Count back from 20 0 points 0 points  Months in reverse 0 points 0 points  Repeat phrase 0 points 0 points  Total Score 0 points 0 points    Immunizations Immunization History  Administered Date(s) Administered   Influenza, High Dose Seasonal PF 06/19/2018   PFIZER(Purple Top)SARS-COV-2 Vaccination 01/18/2020, 02/08/2020, 05/21/2021   Pneumococcal Conjugate-13 01/03/2019   Pneumococcal Polysaccharide-23 12/20/2011   Td 11/13/2009    TDAP status: Due, Education has been provided regarding the importance of this vaccine. Advised may receive this vaccine at local pharmacy or Health Dept. Aware to provide a copy of the vaccination record if obtained from local pharmacy or Health Dept. Verbalized acceptance and understanding.  Flu Vaccine status: Declined, Education has been provided regarding the importance  of this vaccine but patient still declined. Advised may receive this vaccine at local pharmacy or Health Dept. Aware to provide a copy of the vaccination record if obtained from local pharmacy or Health Dept. Verbalized acceptance and understanding.  Pneumococcal vaccine status: Up to date  Covid-19 vaccine status: Completed vaccines  Qualifies for Shingles Vaccine? Yes   Zostavax completed No   Shingrix Completed?: No.    Education has been provided regarding the importance of this vaccine. Patient has been advised to call insurance company to determine out of pocket expense if they have not yet received this vaccine. Advised may also receive vaccine at local pharmacy or Health Dept. Verbalized acceptance and understanding.  Screening Tests Health Maintenance  Topic Date Due   DTaP/Tdap/Td (2 - Tdap)  11/14/2019   COVID-19 Vaccine (4 - 2023-24 season) 09/02/2022 (Originally 03/04/2022)   INFLUENZA VACCINE  10/02/2022 (Originally 02/01/2022)   Zoster Vaccines- Shingrix (1 of 2) 11/15/2022 (Originally 11/26/1996)   Diabetic kidney evaluation - eGFR measurement  06/04/2023   Diabetic kidney evaluation - Urine ACR  06/04/2023   Medicare Annual Wellness (AWV)  08/18/2023   Fecal DNA (Cologuard)  10/05/2024   Pneumonia Vaccine 21+ Years old  Completed   Hepatitis C Screening  Completed   HPV VACCINES  Aged Out    Health Maintenance  Health Maintenance Due  Topic Date Due   DTaP/Tdap/Td (2 - Tdap) 11/14/2019    Colorectal cancer screening: Type of screening: Cologuard. Completed 10/05/21. Repeat every 3 years  Lung Cancer Screening: (Low Dose CT Chest recommended if Age 68-80 years, 30 pack-year currently smoking OR have quit w/in 15years.) does not qualify.     Additional Screening:  Hepatitis C Screening: does qualify; Completed 01/03/19  Vision Screening: Recommended annual ophthalmology exams for early detection of glaucoma and other disorders of the eye. Is the patient up to date with their annual eye exam?  Yes  Who is the provider or what is the name of the office in which the patient attends annual eye exams? Dr Katy Fitch If pt is not established with a provider, would they like to be referred to a provider to establish care? No .   Dental Screening: Recommended annual dental exams for proper oral hygiene  Community Resource Referral / Chronic Care Management:  CRR required this visit?  No   CCM required this visit?  No      Plan:     I have personally reviewed and noted the following in the patient's chart:   Medical and social history Use of alcohol, tobacco or illicit drugs  Current medications and supplements including opioid prescriptions. Patient is not currently taking opioid prescriptions. Functional ability and status Nutritional status Physical  activity Advanced directives List of other physicians Hospitalizations, surgeries, and ER visits in previous 12 months Vitals Screenings to include cognitive, depression, and falls Referrals and appointments  In addition, I have reviewed and discussed with patient certain preventive protocols, quality metrics, and best practice recommendations. A written personalized care plan for preventive services as well as general preventive health recommendations were provided to patient.     [name], LPN   [id number]   Nurse Notes: None

## 2022-08-24 ENCOUNTER — Telehealth: Payer: Self-pay

## 2022-08-24 NOTE — Progress Notes (Signed)
Care Management & Coordination Services Pharmacy Team  Reason for Encounter: Hypertension  Contacted patient to discuss hypertension and diabetic disease state. Spoke with  patients wife  on 08/24/2022   Current antihypertensive regimen:  Amlodipine 5 mg twice daily Hydralazine 100 mg three times daily Valsartan 160 mg daily Current antihyperglycemic regimen:  Lantus Solostar 100 un/ml  Inject 9 units in the morning and 3 units in the evening  Nateglinide 60 mg once daily Patient verbally confirms he is taking the above medications as directed. Yes  How often are you checking your Blood Pressure?  Patients wife states twice daily  he checks his blood pressure at various times  Current home BP readings:  DATE:             BP               PULSE 08/20/22 141/72  60    129/57  61 08/21/22 109/66  56   159/64  61 08/22/22 140/64  63   130/49  64 02/20/204 165/64  61   128/56  63 08/24/22 154/58  64  Wrist or arm cuff: Arm  OTC medications including pseudoephedrine or NSAIDs? Patients wife denies  Any readings above 180/100?  Patients wife denies  What recent interventions/DTPs have been made by any provider to improve Blood Pressure control since last CPP Visit: No recent interventions  Any recent hospitalizations or ED visits since last visit with CPP? No recent hospital visits  What diet changes have been made to improve Blood Pressure and Blood Sugar Control?  Patient tries to eat low carb and lower sugar Breakfast - patient will eat cereal, walnuts, applesauce or egg, grits and toast Lunch - patient will eat a sandwich or left overs Dinner - patient will have a meal with meat, vegetable, starch and fruit Caffeine intake: 1 cup of coffee daily and tea Salt intake: doesn't add salt  What exercise is being done to improve your Blood Pressure and Blood Sugar Control?  Patient will walk up and down stairs multiple times daily  Patient reports hypoglycemic symptoms,  including Shaky  Patient reports hyperglycemic symptoms, including fatigue  How often are you checking your blood sugar? Patients wife states twice daily  What are your blood sugars ranging?  Fasting: 08/20/22 161          08/21/22  151                     08/22/22  146                     08/23/22  129                     08/24/22  151                      After meals: 08/20/22 71                08/21/22 151     08/22/22 127     08/23/22 121      During the week, how often does your blood glucose drop below 70? Patients wife states very rare, he will feel shaky, have something to eat and his blood sugar will come back up quickly.  Are you checking your feet daily/regularly? Patients wife states no however, she will start checking his feet daily.   Adherence Review: Is the patient currently on a STATIN medication? No  Is the patient currently on ACE/ARB medication? Yes Does the patient have >5 day gap between last estimated fill dates? No  Care Gaps: AWV - scheduled 08/17/2022 Next appointment - 11/04/2022 Last eye exam - patients wife states Jan 2023 Last foot exam - patients wife states unsure and needs to schedule this Tdap - overdue Covid - postponed Flu - postponed Shingrix - postponed   Star Rating Drug: Nateglinide 31m - last filled 06/13/2022 90 DS at [facility] Valsartan 1635m- [facility] Recent office visits:  08/11/2022 CoDorothyann PengP - Patient was seen for Type 2 diabetes mellitus with stage 4 chronic kidney disease, with long-term current use of insulin and an additional concern. Discontinued Tramadol, Changed Lantus  back to 3 units at night time.  Recent consult visits:  07/15/2022 Peter JoMartiniqueD (cardiology) - Patient was seen for dyspnea on exertion and additional concerns. Decreased Aspirin to 81 mg daily  Hospital visits:  None  Medications: Outpatient Encounter Medications as of 08/24/2022   Medication Sig Note   amLODipine (NORVASC) 5 MG tablet Take 5 mg by mouth in the morning and at bedtime.    aspirin EC 81 MG tablet Take 1 tablet (81 mg total) by mouth daily. Swallow whole.    B Complex Vitamins (VITAMIN B COMPLEX) TABS Take by mouth.    Blood Glucose Monitoring Suppl (ONETOUCH VERIO) w/Device KIT Use with existing test strips    brimonidine (ALPHAGAN) 0.2 % ophthalmic solution SMARTSIG:In Eye(s)    calcitRIOL (ROCALTROL) 0.25 MCG capsule Take 1 capsule by mouth every other day.    Cholecalciferol 50 MCG (2000 UT) CAPS Take 1 capsule by mouth daily.    ezetimibe (ZETIA) 10 MG tablet TAKE 1 TABLET(10 MG) BY MOUTH DAILY    ferrous sulfate 325 (65 FE) MG tablet Take 325 mg by mouth 2 (two) times daily with a meal. 06/08/2022: Patient takes 1 tab daily   hydrALAZINE (APRESOLINE) 100 MG tablet Take 100 mg by mouth 3 (three) times daily.    insulin glargine (LANTUS SOLOSTAR) 100 UNIT/ML Solostar Pen Inject 9 Units into the skin 2 (two) times daily. (Patient taking differently: Inject 9 Units into the skin 2 (two) times daily. 9 units in the morning and 3 units in the evening)    labetalol (NORMODYNE) 200 MG tablet TAKE 1 TABLET(200 MG) BY MOUTH THREE TIMES DAILY    latanoprost (XALATAN) 0.005 % ophthalmic solution 1 drop at bedtime.    nateglinide (STARLIX) 60 MG tablet TAKE 1 TABLET BY MOUTH ONCE A DAY WITH MEALS    ONETOUCH VERIO test strip USE TO TEST TWICE DAILY AS DIRECTED    timolol (TIMOPTIC) 0.5 % ophthalmic solution 1 drop 2 (two) times daily.    valsartan (DIOVAN) 160 MG tablet Take 80 mg by mouth 2 (two) times daily.    No facility-administered encounter medications on file as of 08/24/2022.  Fill History:   Dispensed Days Supply Quantity Provider Pharmacy  AMLODIPINE BESYLATE 5MG TABLETS 08/15/2022 30 60 each      Dispensed Days Supply Quantity Provider Pharmacy  BRIMONIDINE TARTRATE  0.2 % SOLN 07/31/2022 90 30 mL      Dispensed Days Supply Quantity Provider  Pharmacy  CALCITRIOL 0.25MCG CAPSULES 08/11/2022 90 90 each      Dispensed Days Supply Quantity Provider Pharmacy  EZETIMIBE 10MG TABLETS 08/17/2022 90 90 each      Dispensed Days Supply Quantity Provider Pharmacy  HYDRALAZINE 100MG (HUNDRED MG) TABS  07/29/2022 90 270 each      Dispensed Days Supply Quantity Provider Pharmacy  LANTUS SOLOSTAR PEN INJ 3ML 04/04/2022 68 15 mL      Dispensed Days Supply Quantity Provider Pharmacy  LABETALOL 200MG TABLETS 06/13/2022 90 270 each      Dispensed Days Supply Quantity Provider Pharmacy  LATANOPROST 0.005% OPHTH SOLN 2.5ML 07/14/2022 75 7.5 mL      Dispensed Days Supply Quantity Provider Pharmacy  TIMOLOL MALEATE 0.5% OPTH SOL 15ML 07/20/2022 75 15 mL      Dispensed Days Supply Quantity Provider Pharmacy  VALSARTAN 160MG TABLETS 08/11/2022 30 30 each      Recent Office Vitals: BP Readings from Last 3 Encounters:  08/11/22 (!) 160/92  07/15/22 138/72  06/08/22 (!) 155/79   Pulse Readings from Last 3 Encounters:  08/11/22 70  07/15/22 84  06/08/22 65    Wt Readings from Last 3 Encounters:  08/17/22 230 lb (104.3 kg)  08/11/22 230 lb (104.3 kg)  07/15/22 232 lb 12.8 oz (105.6 kg)     Kidney Function Lab Results  Component Value Date/Time   CREATININE 3.09 (H) 06/03/2022 08:22 AM   CREATININE 2.86 (H) 03/16/2022 07:44 AM   CREATININE 2.80 (H) 08/07/2020 08:53 AM   CREATININE 2.52 (H) 01/15/2020 08:11 AM   CREATININE 2.77 (H) 04/03/2019 11:06 AM   GFR 19.01 (L) 06/03/2022 08:22 AM   GFRNONAA 23 (L) 08/07/2020 08:53 AM   GFRAA 30 05/24/2019 12:00 AM   GFRAA 25 (L) 04/03/2019 11:06 AM       Latest Ref Rng & Units 06/03/2022    8:22 AM 03/16/2022    7:44 AM 05/20/2021   10:09 AM  BMP  Glucose 70 - 99 mg/dL 137  136  188   BUN 6 - 23 mg/dL 43  43  43   Creatinine 0.40 - 1.50 mg/dL 3.09  2.86  2.70   Sodium 135 - 145 mEq/L 141  140  138   Potassium 3.5 - 5.1 mEq/L 3.6  4.4  4.3   Chloride 96 - 112 mEq/L 105  107  107    CO2 19 - 32 mEq/L 26  24  24   $ Calcium 8.4 - 10.5 mg/dL 9.3  9.3  9.2     Recent Relevant Labs: Lab Results  Component Value Date/Time   HGBA1C 7.1 (A) 08/11/2022 07:33 AM   HGBA1C 6.6 (A) 06/03/2022 07:45 AM   HGBA1C 7.0 (H) 03/16/2022 07:44 AM   HGBA1C 7.2 (H) 05/20/2021 10:09 AM   HGBA1C 6.5 06/19/2018 08:43 AM   HGBA1C 5.5 03/21/2018 07:14 AM   MICROALBUR 10.4 (H) 06/03/2022 08:22 AM   MICROALBUR 2.2 (H) 02/13/2014 09:53 AM    Kidney Function Lab Results  Component Value Date/Time   CREATININE 3.09 (H) 06/03/2022 08:22 AM   CREATININE 2.86 (H) 03/16/2022 07:44 AM   CREATININE 2.80 (H) 08/07/2020 08:53 AM   CREATININE 2.52 (H) 01/15/2020 08:11 AM   CREATININE 2.77 (H) 04/03/2019 11:06 AM   GFR 19.01 (L) 06/03/2022 08:22 AM   GFRNONAA 23 (L) 08/07/2020 08:53 AM   GFRAA 30 05/24/2019 12:00 AM   GFRAA 25 (L) 04/03/2019 11:06 AM   [place]  Clinical Pharmacist Assistant [phone]

## 2022-08-29 ENCOUNTER — Other Ambulatory Visit: Payer: Self-pay | Admitting: Adult Health

## 2022-08-29 DIAGNOSIS — N184 Chronic kidney disease, stage 4 (severe): Secondary | ICD-10-CM

## 2022-09-06 ENCOUNTER — Ambulatory Visit: Payer: Medicare Other | Admitting: Adult Health

## 2022-09-16 ENCOUNTER — Telehealth: Payer: Self-pay | Admitting: Adult Health

## 2022-09-16 NOTE — Progress Notes (Unsigned)
Called pt to screen for Depression and Anxiety due to spouse concerns. Pt was unable to be seen due to not having appts. Pt also declined an appt. Pt stated that his daughter is very ill and a lot of his concerns and his illnesses are coming from stress. Pt stated it is manageable and he can cope with it. I advised pt that if he needs to have an appointment to speak to [facility] about concerns to call us back. Pt verbalized understanding.

## 2022-09-16 NOTE — Telephone Encounter (Signed)
Spoke to pt spouse and she said she thinks pt is depressed due to their daughter being ill. Will call pt later to go over PHQ9 as he was not home.

## 2022-09-16 NOTE — Telephone Encounter (Signed)
Patient's spouse feels "something isn't right" with her husband, feels something isn't right, he has been very lethargic, in pain but has trouble verbalizing what the issue is. Has OV in May

## 2022-09-16 NOTE — Telephone Encounter (Signed)
Pt wife is calling and said she missed a call from office ?

## 2022-09-27 DIAGNOSIS — I10 Essential (primary) hypertension: Secondary | ICD-10-CM | POA: Diagnosis not present

## 2022-09-27 DIAGNOSIS — R809 Proteinuria, unspecified: Secondary | ICD-10-CM | POA: Diagnosis not present

## 2022-09-27 DIAGNOSIS — E211 Secondary hyperparathyroidism, not elsewhere classified: Secondary | ICD-10-CM | POA: Diagnosis not present

## 2022-09-27 DIAGNOSIS — E1169 Type 2 diabetes mellitus with other specified complication: Secondary | ICD-10-CM | POA: Diagnosis not present

## 2022-09-27 DIAGNOSIS — E559 Vitamin D deficiency, unspecified: Secondary | ICD-10-CM | POA: Diagnosis not present

## 2022-09-27 DIAGNOSIS — N189 Chronic kidney disease, unspecified: Secondary | ICD-10-CM | POA: Diagnosis not present

## 2022-09-27 DIAGNOSIS — M1000 Idiopathic gout, unspecified site: Secondary | ICD-10-CM | POA: Diagnosis not present

## 2022-09-27 DIAGNOSIS — N184 Chronic kidney disease, stage 4 (severe): Secondary | ICD-10-CM | POA: Diagnosis not present

## 2022-09-27 DIAGNOSIS — D6489 Other specified anemias: Secondary | ICD-10-CM | POA: Diagnosis not present

## 2022-09-27 DIAGNOSIS — R309 Painful micturition, unspecified: Secondary | ICD-10-CM | POA: Diagnosis not present

## 2022-11-03 ENCOUNTER — Telehealth: Payer: Self-pay

## 2022-11-03 ENCOUNTER — Encounter: Payer: Self-pay | Admitting: Adult Health

## 2022-11-03 ENCOUNTER — Ambulatory Visit (INDEPENDENT_AMBULATORY_CARE_PROVIDER_SITE_OTHER): Payer: Medicare Other | Admitting: Adult Health

## 2022-11-03 VITALS — BP 162/88 | HR 60 | Temp 98.3°F | Ht 75.0 in | Wt 229.0 lb

## 2022-11-03 DIAGNOSIS — I129 Hypertensive chronic kidney disease with stage 1 through stage 4 chronic kidney disease, or unspecified chronic kidney disease: Secondary | ICD-10-CM | POA: Diagnosis not present

## 2022-11-03 DIAGNOSIS — R61 Generalized hyperhidrosis: Secondary | ICD-10-CM | POA: Diagnosis not present

## 2022-11-03 DIAGNOSIS — Z794 Long term (current) use of insulin: Secondary | ICD-10-CM | POA: Diagnosis not present

## 2022-11-03 DIAGNOSIS — N184 Chronic kidney disease, stage 4 (severe): Secondary | ICD-10-CM

## 2022-11-03 DIAGNOSIS — E1122 Type 2 diabetes mellitus with diabetic chronic kidney disease: Secondary | ICD-10-CM

## 2022-11-03 DIAGNOSIS — R5383 Other fatigue: Secondary | ICD-10-CM | POA: Diagnosis not present

## 2022-11-03 LAB — POCT GLYCOSYLATED HEMOGLOBIN (HGB A1C): Hemoglobin A1C: 6.7 % — AB (ref 4.0–5.6)

## 2022-11-03 LAB — MICROALBUMIN / CREATININE URINE RATIO
Creatinine,U: 135.4 mg/dL
Microalb Creat Ratio: 23.2 mg/g (ref 0.0–30.0)
Microalb, Ur: 31.4 mg/dL — ABNORMAL HIGH (ref 0.0–1.9)

## 2022-11-03 LAB — TESTOSTERONE: Testosterone: 415.13 ng/dL (ref 300.00–890.00)

## 2022-11-03 NOTE — Progress Notes (Signed)
Subjective:    Patient ID: [name] [name], male    DOB: 1946-02-18, 77 y.o.   MRN: [id number]  HPI 77 year old male who  has a past medical history of Anemia, Cataract, DIABETES MELLITUS, TYPE II (01/02/2007), HYPERLIPIDEMIA (06/18/2007), HYPERTENSION (01/02/2007), RENAL INSUFFICIENCY (02/05/2008), and Stroke (HCC) (2019).  iabetes mellitus type 2-managed with Starlix 60 mg daily and Lantus 9 units in the morning and 3 units in the evening.  During his last visit we d/c his evening lantus of 3 units. Since this change he reports that his blood sugars have been in the 140's.  Lab Results  Component Value Date   HGBA1C 7.1 (A) 08/11/2022   Hypertension with chronic kidney disease stage IV-managed by nephrology.  He is currently prescribed hydralazine 50 mg 3 times daily, Norvasc 5 mg daily, valsartan 160 mg daily, and labetalol 200 mg 3 times daily.  He does check his blood pressure at home with readings and they report readings in the 170's in the mornings and 120's in the evening.  His serum creatinine level ranges mostly between 2.69 and 3.09. BP Readings from Last 3 Encounters:  11/03/22 (!) 162/88  08/11/22 (!) 160/92  07/15/22 138/72   Fatigue - Chronic and long standing. Has been advised of sleepy study in the past and does not want to do it.  Night Sweats - has been going on for over a year and is intermittent. Has found lately that he has to get up and change his shirts due to soaking through them. Had recent CBC done by nephrology did not show a white count. Last chest xray 6 months ago did not show active infection.   Review of Systems See HPI   Past Medical History:  Diagnosis Date   Anemia    Cataract    DIABETES MELLITUS, TYPE II 01/02/2007   HYPERLIPIDEMIA 06/18/2007   HYPERTENSION 01/02/2007   RENAL INSUFFICIENCY 02/05/2008   Stroke (HCC) 2019    Social History   Socioeconomic History   Marital status: Married    Spouse name: Not on file   Number of  children: Not on file   Years of education: Not on file   Highest education level: Not on file  Occupational History   Not on file  Tobacco Use   Smoking status: Never   Smokeless tobacco: Never  Vaping Use   Vaping Use: Never used  Substance and Sexual Activity   Alcohol use: No   Drug use: No   Sexual activity: Not on file  Other Topics Concern   Not on file  Social History Narrative   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 Likes to go to football games.    Social Determinants of Health   Financial Resource Strain: Low Risk  (02/14/2022)   Overall Financial Resource Strain (CARDIA)    Difficulty of Paying Living Expenses: Not hard at all  Food Insecurity: No Food Insecurity (08/17/2022)   Hunger Vital Sign    Worried About Running Out of Food in the Last Year: Never true    Ran Out of Food in the Last Year: Never true  Transportation Needs: No Transportation Needs (08/17/2022)   PRAPARE - Administrator, Civil Service (Medical): No    Lack of Transportation (Non-Medical): No  Physical Activity: Inactive (08/17/2022)  Exercise Vital Sign    Days of Exercise per Week: 0 days    Minutes of Exercise per Session: 0 min  Stress: No Stress Concern Present (08/17/2022)   [employer] of Occupational Health - Occupational Stress Questionnaire    Feeling of Stress : Not at all  Social Connections: Socially Integrated (08/17/2022)   Social Connection and Isolation Panel [NHANES]    Frequency of Communication with Friends and Family: More than three times a week    Frequency of Social Gatherings with Friends and Family: More than three times a week    Attends Religious Services: More than 4 times per year    Active Member of Clubs or Organizations: Yes    Attends Banker Meetings: More than 4 times per year    Marital Status: Married   Catering manager Violence: Not At Risk (08/17/2022)   Humiliation, Afraid, Rape, and Kick questionnaire    Fear of Current or Ex-Partner: No    Emotionally Abused: No    Physically Abused: No    Sexually Abused: No    Past Surgical History:  Procedure Laterality Date   LOOP RECORDER INSERTION N/A 07/11/2017   Procedure: LOOP RECORDER INSERTION;  Surgeon: Hillis Range, MD;  Location: [facility] INVASIVE CV LAB;  Service: Cardiovascular;  Laterality: N/A;   SHOULDER SURGERY     left   TEE WITHOUT CARDIOVERSION N/A 07/11/2017   Procedure: TRANSESOPHAGEAL ECHOCARDIOGRAM (TEE);  Surgeon: Lars Masson, MD;  Location: [facility] ENDOSCOPY;  Service: Cardiovascular;  Laterality: N/A;   TRANSURETHRAL RESECTION OF PROSTATE     history of retention/hematuria     Family History  Problem Relation Age of Onset   Hypertension Mother    Diabetes Mother    Stroke Father    Stroke Maternal Grandmother    Colon cancer Neg Hx    Esophageal cancer Neg Hx    Rectal cancer Neg Hx    Stomach cancer Neg Hx     Allergies  Allergen Reactions   Evolocumab Hypertension    Other reaction(s): Other (see comments)   5-Alpha Reductase Inhibitors Other (See Comments)    myalgia Other reaction(s): Other (see comments)   Bactrim [Sulfamethoxazole-Trimethoprim] Hives, Itching and Swelling   Penicillins Hives    Has patient had a PCN reaction causing immediate rash, facial/tongue/throat swelling, SOB or lightheadedness with hypotension: Unk Has patient had a PCN reaction causing severe rash involving mucus membranes or skin necrosis: Unk Has patient had a PCN reaction that required hospitalization: Unk Has patient had a PCN reaction occurring within the last 10 years: No If all of the above answers are "NO", then may proceed with Cephalosporin use. Other reaction(s): Other (see comments)   Statins     myalgia Other reaction(s): Other (see comments)   Sulfa Antibiotics     Other reaction(s): Other (see comments)    Sulfa Drugs Cross Reactors Hives, Itching and Swelling   Sulfamethoxazole-Trimethoprim Hives and Itching    Other reaction(s): Other (see comments)    Current Outpatient Medications on File Prior to Visit  Medication Sig Dispense Refill   amLODipine (NORVASC) 5 MG tablet Take 5 mg by mouth in the morning and at bedtime.     aspirin EC 81 MG tablet Take 1 tablet (81 mg total) by mouth daily. Swallow whole. 90 tablet 3   B Complex Vitamins (VITAMIN B COMPLEX) TABS Take by mouth.     Blood Glucose Monitoring Suppl (ONETOUCH VERIO) w/Device KIT Use with existing test strips 1  kit kit   brimonidine (ALPHAGAN) 0.2 % ophthalmic solution SMARTSIG:In Eye(s)     calcitRIOL (ROCALTROL) 0.25 MCG capsule Take 1 capsule by mouth every other day.     Cholecalciferol 50 MCG (2000 UT) CAPS Take 1 capsule by mouth daily.     ezetimibe (ZETIA) 10 MG tablet TAKE 1 TABLET(10 MG) BY MOUTH DAILY 90 tablet 3   ferrous sulfate 325 (65 FE) MG tablet Take 325 mg by mouth 2 (two) times daily with a meal.     hydrALAZINE (APRESOLINE) 100 MG tablet Take 100 mg by mouth 3 (three) times daily.     labetalol (NORMODYNE) 200 MG tablet TAKE 1 TABLET(200 MG) BY MOUTH THREE TIMES DAILY 270 tablet 3   LANTUS SOLOSTAR 100 UNIT/ML Solostar Pen ADMINISTER 9 UNITS UNDER THE SKIN TWICE DAILY 15 mL 0   latanoprost (XALATAN) 0.005 % ophthalmic solution 1 drop at bedtime.     nateglinide (STARLIX) 60 MG tablet TAKE 1 TABLET BY MOUTH ONCE A DAY WITH MEALS     ONETOUCH VERIO test strip USE TO TEST TWICE DAILY AS DIRECTED 200 strip 2   timolol (TIMOPTIC) 0.5 % ophthalmic solution 1 drop 2 (two) times daily.     valsartan (DIOVAN) 160 MG tablet Take 80 mg by mouth 2 (two) times daily.     No current facility-administered medications on file prior to visit.    BP (!) 162/88   Pulse 60   Temp 98.3 F (36.8 C) (Oral)   Ht 6\' 3"  (1.905 m)   Wt 229 lb (103.9 kg)   SpO2 99%   BMI 28.62 kg/m       Objective:   Physical Exam Vitals  and nursing note reviewed.  Constitutional:      Appearance: Normal appearance.  Cardiovascular:     Rate and Rhythm: Normal rate and regular rhythm.     Pulses: Normal pulses.     Heart sounds: Normal heart sounds.  Pulmonary:     Effort: Pulmonary effort is normal.     Breath sounds: Normal breath sounds.  Skin:    General: Skin is warm and dry.  Neurological:     General: No focal deficit present.     Mental Status: He is alert and oriented to person, place, and time.  Psychiatric:        Mood and Affect: Mood normal.        Behavior: Behavior normal.        Thought Content: Thought content normal.        Judgment: Judgment normal.       Assessment & Plan:  1. Type 2 diabetes mellitus with stage 4 chronic kidney disease, with long-term current use of insulin (HCC)  - POC HgB A1c- 6.7  - No change in medication - Follow up in September for CPE   - Microalbumin/Creatinine Ratio, Urine; Future  2. Hypertensive kidney disease - Per Nephrology  - Microalbumin/Creatinine Ratio, Urine; Future  3. Other fatigue - Needs sleep study to r/o sleep apnea  - Testosterone; Future  4. Night sweats - Doubt bacterial infection. Will have him check his BS when this happens  - Testosterone; Future  Shirline Frees, NP

## 2022-11-03 NOTE — Patient Instructions (Signed)
Health Maintenance Due  Topic Date Due   DTaP/Tdap/Td (2 - Tdap) 11/14/2019   COVID-19 Vaccine (4 - 2023-24 season) 03/04/2022      Row Labels 09/16/2022    4:08 PM 08/17/2022   10:06 AM 11/16/2021    7:09 AM  Depression screen PHQ 2/9   Section Header. No data exists in this row.     Decreased Interest   0 0 1  Down, Depressed, Hopeless   0 0 1  PHQ - 2 Score   0 0 2  Altered sleeping   3  2  Tired, decreased energy   1  1  Change in appetite   0  2  Feeling bad or failure about yourself    0  1  Trouble concentrating   0  1  Moving slowly or fidgety/restless   0  1  Suicidal thoughts   0  0  PHQ-9 Score   4  10  Difficult doing work/chores   Not difficult at all  Somewhat difficult

## 2022-11-03 NOTE — Progress Notes (Signed)
Care Management & Coordination Services Pharmacy Team  Reason for Encounter: Appointment Reminder  Contacted patient to confirm telephone appointment with Delano Metz, PharmD on 11/04/2022 at 1:00. Spoke with patients wife on 11/03/2022   Do you have any problems getting your medications? None  What is your top health concern you would like to discuss at your upcoming visit? None  Have you seen any other providers since your last visit with PCP? None  Care Gaps: AWV - scheduled 08/17/2022 Last eye exam - 07/11/2022 Dr Dione Booze Last foot exam - not noted Last BP - 162/88 on 11/03/2022 Last A1C - 6.7 on 11/03/2022 Tdap - overdue Covid - overdue Shingrix - postponed  Star Rating Drug: Nateglinide 60mg  - last filled 09/06/2022 90 DS at [facility] Valsartan 160mg  - last filled 10/28/2022 90 DS at [facility]  Clinical Pharmacist Assistant [phone]

## 2022-11-03 NOTE — Progress Notes (Signed)
Care Management & Coordination Services Pharmacy Note  11/04/2022 Name:  [name] [name] MRN:  [id number] DOB:  [birth year]  Summary: BP not at goal <140/90 in OV; reading was taking prior to patient meds, reports WNL at home, checking twice daily LDL not at goal <70  Recommendations/Changes made from today's visit: -Counseled to continue to check BP 2-3x/day, and keep a detailed log that shows time taken and last med dose -Counseled to limit sodium intake -Counseled on cholesterol-lowering diet as patient has been unable to tolerate medications other than zetia  Follow up plan: -Nephrology in July -BP call in August -PCP visit in Sept -Pharmacist visit in October   Subjective: [name] [name] is an 77 y.o. year old male who is a primary patient of Shirline Frees, NP.  The care coordination team was consulted for assistance with disease management and care coordination needs.    Engaged with patient by telephone for follow up visit.  Recent office visits: 11/03/22 Shirline Frees, NP - For T2DM with stage 4 CKD - no med changes  08/17/22 Theresa Mulligan, LPN - For AWV, no med changes  08/11/2022 Shirline Frees NP - Patient was seen for Type 2 diabetes mellitus with stage 4 chronic kidney disease, with long-term current use of insulin and an additional concern. Discontinued Tramadol, Changed Lantus  back to 3 units at night time.   06/03/22 Shirline Frees, NP: Patient presented for DM follow up. D/c'd evening insulin. Referred to cardiology.   Recent consult visits: 09/27/22 Aaron Edelman, MD (Nephrology) - For CKD, no medication changes  07/15/2022 [name] MD (cardiology) - Patient was seen for dyspnea on exertion and additional concerns. Decreased Aspirin to 81 mg daily   07/11/22 Sallye Lat (opthamology) - For glaucoma, no med changes  06/25/22 Aaron Edelman, MD (nephrology): Patient presented for CKD follow up. No medication changes. Follow up in 4 months.    06/08/22 [name], MD (cardiology): Patient presented for dyspnea on exertion.  Plan for Echo.   Hospital visits: None in previous 6 months   Objective:  Lab Results  Component Value Date   CREATININE 3.09 (H) 06/03/2022   BUN 43 (H) 06/03/2022   GFR 19.01 (L) 06/03/2022   GFRNONAA 23 (L) 08/07/2020   GFRAA 30 05/24/2019   NA 141 06/03/2022   K 3.6 06/03/2022   CALCIUM 9.3 06/03/2022   CO2 26 06/03/2022   GLUCOSE 137 (H) 06/03/2022    Lab Results  Component Value Date/Time   HGBA1C 6.7 (A) 11/03/2022 07:40 AM   HGBA1C 7.1 (A) 08/11/2022 07:33 AM   HGBA1C 7.0 (H) 03/16/2022 07:44 AM   HGBA1C 7.2 (H) 05/20/2021 10:09 AM   HGBA1C 6.5 06/19/2018 08:43 AM   HGBA1C 5.5 03/21/2018 07:14 AM   GFR 19.01 (L) 06/03/2022 08:22 AM   GFR 20.89 (L) 03/16/2022 07:44 AM   MICROALBUR 31.4 (H) 11/03/2022 08:08 AM   MICROALBUR 10.4 (H) 06/03/2022 08:22 AM    Last diabetic Eye exam:  Lab Results  Component Value Date/Time   HMDIABEYEEXA No Retinopathy 03/24/2021 12:00 AM    Last diabetic Foot exam:  Lab Results  Component Value Date/Time   HMDIABFOOTEX done 09/30/2013 12:00 AM     Lab Results  Component Value Date   CHOL 181 03/16/2022   HDL 41.60 03/16/2022   LDLCALC 113 (H) 03/16/2022   LDLDIRECT 180.7 06/24/2013   TRIG 135.0 03/16/2022   CHOLHDL 4 03/16/2022       Latest Ref Rng & Units 03/16/2022  7:44 AM 01/08/2021    8:18 AM 08/07/2020    8:53 AM  Hepatic Function  Total Protein 6.0 - 8.3 g/dL 6.9  6.8  6.9   Albumin 3.5 - 5.2 g/dL 4.0  4.3  3.9   AST 0 - 37 U/L 11  13  14    ALT 0 - 53 U/L 11  13  14    Alk Phosphatase 39 - 117 U/L 49  47  58   Total Bilirubin 0.2 - 1.2 mg/dL 0.5  0.4  0.4     Lab Results  Component Value Date/Time   TSH 2.86 03/16/2022 07:44 AM   TSH 2.43 01/08/2021 08:18 AM       Latest Ref Rng & Units 06/03/2022    8:22 AM 03/16/2022    7:44 AM 01/08/2021    8:18 AM  CBC  WBC 4.0 - 10.5 K/uL 9.1  7.8  7.6   Hemoglobin 13.0 - 17.0  g/dL 16.1  09.6  04.5   Hematocrit 39.0 - 52.0 % 35.6  34.3  33.5   Platelets 150.0 - 400.0 K/uL 270.0  230.0  240.0     Lab Results  Component Value Date/Time   VD25OH 45 09/04/2019 12:00 AM   VD25OH 50.29 11/02/2018 08:16 AM   VD25OH 46 09/18/2018 12:00 AM   VD25OH 12.19 (L) 09/19/2017 10:00 AM   VITAMINB12 550 03/16/2022 07:44 AM   VITAMINB12 459 08/07/2020 08:53 AM    Clinical ASCVD: Yes  The ASCVD Risk score (Arnett DK, et al., 2019) failed to calculate for the following reasons:   The patient has a prior MI or stroke diagnosis       09/16/2022    4:08 PM 08/17/2022   10:06 AM 11/16/2021    7:09 AM  Depression screen PHQ 2/9  Decreased Interest 0 0 1  Down, Depressed, Hopeless 0 0 1  PHQ - 2 Score 0 0 2  Altered sleeping 3  2  Tired, decreased energy 1  1  Change in appetite 0  2  Feeling bad or failure about yourself  0  1  Trouble concentrating 0  1  Moving slowly or fidgety/restless 0  1  Suicidal thoughts 0  0  PHQ-9 Score 4  10  Difficult doing work/chores Not difficult at all  Somewhat difficult     Social History   Tobacco Use  Smoking Status Never  Smokeless Tobacco Never   BP Readings from Last 3 Encounters:  11/03/22 (!) 162/88  08/11/22 (!) 160/92  07/15/22 138/72   Pulse Readings from Last 3 Encounters:  11/03/22 60  08/11/22 70  07/15/22 84   Wt Readings from Last 3 Encounters:  11/03/22 229 lb (103.9 kg)  08/17/22 230 lb (104.3 kg)  08/11/22 230 lb (104.3 kg)   BMI Readings from Last 3 Encounters:  11/03/22 28.62 kg/m  08/17/22 28.75 kg/m  08/11/22 28.75 kg/m    Allergies  Allergen Reactions   Evolocumab Hypertension    Other reaction(s): Other (see comments)   5-Alpha Reductase Inhibitors Other (See Comments)    myalgia Other reaction(s): Other (see comments)   Bactrim [Sulfamethoxazole-Trimethoprim] Hives, Itching and Swelling   Penicillins Hives    Has patient had a PCN reaction causing immediate rash,  facial/tongue/throat swelling, SOB or lightheadedness with hypotension: Unk Has patient had a PCN reaction causing severe rash involving mucus membranes or skin necrosis: Unk Has patient had a PCN reaction that required hospitalization: Unk Has patient had a PCN reaction occurring  within the last 10 years: No If all of the above answers are "NO", then may proceed with Cephalosporin use. Other reaction(s): Other (see comments)   Statins     myalgia Other reaction(s): Other (see comments)   Sulfa Antibiotics     Other reaction(s): Other (see comments)   Sulfa Drugs Cross Reactors Hives, Itching and Swelling   Sulfamethoxazole-Trimethoprim Hives and Itching    Other reaction(s): Other (see comments)    Medications Reviewed Today     Reviewed by Sherrin Daisy, CMA (Certified Medical Assistant) on 11/03/22 at [phone]  Med List Status: <None>   Medication Order Taking? Sig Documenting Provider Last Dose Status Informant  amLODipine (NORVASC) 5 MG tablet [id number] Yes Take 5 mg by mouth in the morning and at bedtime. [provider] Taking Active   aspirin EC 81 MG tablet [id number] Yes Take 1 tablet (81 mg total) by mouth daily. Swallow whole. Swaziland, Peter M, MD Taking Active   B Complex Vitamins (VITAMIN B COMPLEX) TABS [id number] Yes Take by mouth. [provider] Taking Active   Blood Glucose Monitoring Suppl [facility] VERIO) w/Device Andria Rhein [id number] Yes Use with existing test strips Nafziger, Kandee Keen, NP Taking Active   brimonidine (ALPHAGAN) 0.2 % ophthalmic solution [id number] Yes SMARTSIG:In Eye(s) [provider] Taking Active   calcitRIOL (ROCALTROL) 0.25 MCG capsule [id number] Yes Take 1 capsule by mouth every other day. [provider] Taking Active Multiple Informants  Cholecalciferol 50 MCG (2000 UT) CAPS [id number] Yes Take 1 capsule by mouth daily. [provider] Taking Active   ezetimibe (ZETIA) 10 MG tablet [id number] Yes TAKE 1 TABLET(10 MG)  BY MOUTH DAILY Nafziger, Kandee Keen, NP Taking Active   ferrous sulfate 325 (65 FE) MG tablet [id number] Yes Take 325 mg by mouth 2 (two) times daily with a meal. [provider] Taking Active            Med Note Glenetta Hew, Lowell Guitar   Wed Jun 08, 2022 10:31 AM) Patient takes 1 tab daily  hydrALAZINE (APRESOLINE) 100 MG tablet [id number] Yes Take 100 mg by mouth 3 (three) times daily. [provider] Taking Active   labetalol (NORMODYNE) 200 MG tablet [id number] Yes TAKE 1 TABLET(200 MG) BY MOUTH THREE TIMES DAILY Nafziger, Kandee Keen, NP Taking Active   LANTUS SOLOSTAR 100 UNIT/ML Solostar Pen [id number] Yes ADMINISTER 9 UNITS UNDER THE SKIN TWICE DAILY Nafziger, Kandee Keen, NP Taking Active   latanoprost (XALATAN) 0.005 % ophthalmic solution [id number] Yes 1 drop at bedtime. [provider] Taking Active   nateglinide (STARLIX) 60 MG tablet [id number] Yes TAKE 1 TABLET BY MOUTH ONCE A DAY WITH MEALS [provider] Taking Active   ONETOUCH VERIO test strip [id number] Yes USE TO TEST TWICE DAILY AS DIRECTED Nafziger, Kandee Keen, NP Taking Active   timolol (TIMOPTIC) 0.5 % ophthalmic solution [id number] Yes 1 drop 2 (two) times daily. [provider] Taking Active   valsartan (DIOVAN) 160 MG tablet [id number] Yes Take 80 mg by mouth 2 (two) times daily. [provider] Taking Active             SDOH:  (Social Determinants of Health) assessments and interventions performed: Yes SDOH Interventions    Flowsheet Row Care Coordination from 11/04/2022 in [facility] Clinical Support from 08/17/2022 in [facility] [place] Telephone from 06/08/2022 in [facility] Coordination Chronic Care Management from 02/14/2022 in [facility] [place] Clinical Support from 08/16/2021 in [facility] Chronic Care Management from 09/30/2020 in [facility]  [place]  SDOH Interventions        Food Insecurity Interventions Intervention Not Indicated Intervention Not Indicated Intervention Not Indicated -- Intervention Not Indicated --  Housing Interventions Intervention Not Indicated Intervention Not Indicated Intervention Not Indicated -- Intervention Not Indicated --  Transportation Interventions -- Intervention Not Indicated Intervention Not Indicated -- Intervention Not Indicated Intervention Not Indicated  Utilities Interventions -- Intervention Not Indicated Intervention Not Indicated -- -- --  Alcohol Usage Interventions -- Intervention Not Indicated (Score <7) -- -- -- --  Financial Strain Interventions -- -- -- Intervention Not Indicated Intervention Not Indicated Intervention Not Indicated  Physical Activity Interventions -- Intervention Not Indicated -- -- Intervention Not Indicated --  Stress Interventions -- Intervention Not Indicated -- -- Intervention Not Indicated --  Social Connections Interventions -- Intervention Not Indicated -- -- Intervention Not Indicated --       Medication Assistance: None required.  Patient affirms current coverage meets needs.  Medication Access: Within the past 30 days, how often has patient missed a dose of medication? None Is a pillbox or other method used to improve adherence? Yes  Factors that may affect medication adherence? no barriers identified Are meds synced by current pharmacy? No  Are meds delivered by current pharmacy? No  Does patient experience delays in picking up medications due to transportation concerns? No   [facility] Reviewed: Is patient disadvantaged to use [facility]?: Yes  Current Rx insurance plan: [facility] Name and location of Current pharmacy:  [id number] - [place], [place] - [address] AT [facility] [address] [facility] [address] [place] [address] Phone: [phone] Fax: [phone]  [facility] services  reviewed with patient today?: No  Patient requests to transfer care to [facility]?: No  Reason patient declined to change pharmacies: Disadvantaged due to insurance/mail order  Compliance/Adherence/Medication fill history: Care Gaps: AWV - scheduled 08/17/2022 Last eye exam - 07/11/2022 Dr Dione Booze Last foot exam - not noted Last BP - 162/88 on 11/03/2022 Last A1C - 6.7 on 11/03/2022 Tdap - overdue Covid - overdue Shingrix - postponed  [facility]: Nateglinide 60mg  - last filled 09/06/2022 90 DS at [facility] Valsartan 160mg  - last filled 10/28/2022 90 DS at [facility]     Assessment/Plan   Hypertension (BP goal <140/90) -Not ideally controlled -Current treatment: Amlodipine 5mg  BID Appropriate, Effective, Safe, Accessible Hydralazine 100mg  TID Appropriate, Effective, Safe, Accessible Labetalol 200mg  TID Appropriate, Effective, Safe, Accessible Valsartan 160mg  1/2 tab BID Appropriate, Effective, Safe, Accessible -Medications previously tried: Atenolol/Chlorthalidone, Clonidine, Lasix -Current home readings: morning before breakfast and evening around 8-9pm, driving so no log but reports at home they are generally below 140/90 unless he is late taking meds because he slept in. -Current dietary habits: be very mindful of salt intake -Current exercise habits: Not discussed -Denies hypotensive/hypertensive symptoms -Educated on BP goals and benefits of medications for prevention of heart attack, stroke and kidney damage; Daily salt intake goal < 2300 mg; Exercise goal of 150 minutes per week; Importance of home blood pressure monitoring; Proper BP monitoring technique; -Counseled to monitor BP at home 2-3x/day, document, and provide log at future appointments -Counseled on diet and exercise extensively Recommended to continue current medication  Hyperlipidemia: (LDL goal < 55) -Uncontrolled -Current treatment: Zetia 10mg  1 qd Appropriate, Effective, Safe,  Accessible -Medications previously tried: statins (myalgias), repatha  -Current dietary patterns: does like to try  to sneak ice cream and does not like veggies but wife tries to enforce -Current exercise habits: not discussed -Educated on Cholesterol goals;  Importance of limiting foods high in cholesterol; -Counseled on diet and exercise extensively Recommended to continue current medication  Sherrill Raring Clinical Pharmacist [phone]

## 2022-11-04 ENCOUNTER — Ambulatory Visit: Payer: Medicare Other

## 2022-11-09 ENCOUNTER — Ambulatory Visit: Payer: Medicare Other | Admitting: Adult Health

## 2022-11-09 ENCOUNTER — Other Ambulatory Visit: Payer: Self-pay | Admitting: Adult Health

## 2022-11-09 DIAGNOSIS — E1122 Type 2 diabetes mellitus with diabetic chronic kidney disease: Secondary | ICD-10-CM

## 2022-11-16 ENCOUNTER — Telehealth: Payer: Self-pay

## 2022-11-16 DIAGNOSIS — H2512 Age-related nuclear cataract, left eye: Secondary | ICD-10-CM | POA: Diagnosis not present

## 2022-11-16 DIAGNOSIS — H40021 Open angle with borderline findings, high risk, right eye: Secondary | ICD-10-CM | POA: Diagnosis not present

## 2022-11-16 DIAGNOSIS — H1045 Other chronic allergic conjunctivitis: Secondary | ICD-10-CM | POA: Diagnosis not present

## 2022-11-16 DIAGNOSIS — H0102A Squamous blepharitis right eye, upper and lower eyelids: Secondary | ICD-10-CM | POA: Diagnosis not present

## 2022-11-16 DIAGNOSIS — H0102B Squamous blepharitis left eye, upper and lower eyelids: Secondary | ICD-10-CM | POA: Diagnosis not present

## 2022-11-16 DIAGNOSIS — H353131 Nonexudative age-related macular degeneration, bilateral, early dry stage: Secondary | ICD-10-CM | POA: Diagnosis not present

## 2022-11-16 DIAGNOSIS — H401122 Primary open-angle glaucoma, left eye, moderate stage: Secondary | ICD-10-CM | POA: Diagnosis not present

## 2022-11-16 DIAGNOSIS — H34212 Partial retinal artery occlusion, left eye: Secondary | ICD-10-CM | POA: Diagnosis not present

## 2022-11-16 DIAGNOSIS — H35033 Hypertensive retinopathy, bilateral: Secondary | ICD-10-CM | POA: Diagnosis not present

## 2022-11-16 DIAGNOSIS — H11123 Conjunctival concretions, bilateral: Secondary | ICD-10-CM | POA: Diagnosis not present

## 2022-11-16 NOTE — Telephone Encounter (Signed)
   Pre-operative Risk Assessment    Patient Name: [name] [name]  DOB: 10-Mar-1946 MRN: [id number]      Request for Surgical Clearance    Procedure:   KAHOOK GONIOTOMY OS  Date of Surgery:  Clearance 12/27/22                                 Surgeon:  DR. Hollace Kinnier Surgeon's Group or Practice Name:  [facility]  Phone number:  [phone] Fax number:  [phone]   Type of Clearance Requested:   - Pharmacy:  Hold Aspirin WHEN TO HOLD PRIOR TO PROCEDURE.   Type of Anesthesia:  MAC   Additional requests/questions:    Signed, Michaelle Copas   11/16/2022, 1:51 PM

## 2022-11-17 NOTE — Telephone Encounter (Signed)
Primary Cardiologist:[name], MD   Preoperative team, please contact this patient and set up a phone call appointment for further preoperative risk assessment. Please obtain consent and complete medication review. Thank you for your help.   On aspirin for history of CVA, however most current Rx by Dr. Swaziland for reduction in aspirin from 325 mg to 81 mg. No arrhythmia  noted on ILR from 2019 to 2022. Recommendation to discuss at time of phone call whether he continues to see neurology or this is managed by PCP.  Will send note to Dr. Dione Booze to also seek request to hold from PCP.   Levi Aland, NP-C  11/17/2022, 10:14 AM [address], [address] Office [phone] Fax [phone]

## 2022-11-17 NOTE — Telephone Encounter (Signed)
DPR ok to s/w the pt's wife. I s/w pt's wife and explained the pt would need a tele appt for pre op clearance and keep the appt with [facility] for the f/u that has been scheduled for 01/12/23. I gave the option we could move the in office appt up sooner, which that way their insurance is not billed for 2 appts. Pt's wife states they would like to just move appt up sooner to come in the office. I have rescheduled the 01/12/23 appt to 11/23/22 with Azalee Course, [facility] @ 1:55. Pt's wife thanked me for the call and the help. I will update all parties involved.

## 2022-11-17 NOTE — Telephone Encounter (Signed)
Request to Dr. Laruth Bouchard office to reach out to patient regarding whether he is followed by neurology or PCP for history of CVA in 2019.   Cardiology will address medical clearance with a virtual visit in the near future.   [name] Aland, NP-C  11/17/2022, 10:19 AM [address], [address] Office [phone] Fax [phone]

## 2022-11-18 ENCOUNTER — Encounter: Payer: Self-pay | Admitting: Hematology

## 2022-11-18 ENCOUNTER — Other Ambulatory Visit (HOSPITAL_BASED_OUTPATIENT_CLINIC_OR_DEPARTMENT_OTHER): Payer: Self-pay

## 2022-11-18 MED ORDER — COMIRNATY 30 MCG/0.3ML IM SUSY
0.3000 mL | PREFILLED_SYRINGE | INTRAMUSCULAR | 0 refills | Status: DC
  Filled 2022-11-18: qty 0.3, 1d supply, fill #0

## 2022-11-23 ENCOUNTER — Encounter: Payer: Self-pay | Admitting: Physician Assistant

## 2022-11-23 ENCOUNTER — Ambulatory Visit: Payer: Medicare Other | Attending: Physician Assistant | Admitting: Physician Assistant

## 2022-11-23 VITALS — BP 130/60 | HR 61 | Ht 75.0 in | Wt 230.8 lb

## 2022-11-23 DIAGNOSIS — I10 Essential (primary) hypertension: Secondary | ICD-10-CM

## 2022-11-23 DIAGNOSIS — E785 Hyperlipidemia, unspecified: Secondary | ICD-10-CM

## 2022-11-23 DIAGNOSIS — Z794 Long term (current) use of insulin: Secondary | ICD-10-CM

## 2022-11-23 DIAGNOSIS — R0609 Other forms of dyspnea: Secondary | ICD-10-CM | POA: Diagnosis not present

## 2022-11-23 DIAGNOSIS — Z01818 Encounter for other preprocedural examination: Secondary | ICD-10-CM | POA: Diagnosis not present

## 2022-11-23 DIAGNOSIS — N184 Chronic kidney disease, stage 4 (severe): Secondary | ICD-10-CM

## 2022-11-23 DIAGNOSIS — E119 Type 2 diabetes mellitus without complications: Secondary | ICD-10-CM

## 2022-11-23 NOTE — Progress Notes (Unsigned)
Cardiology Office Note:    Date:  11/24/2022   ID:  [name] [name], DOB 08/24/1945, MRN [id number]  PCP:  Shirline Frees, NP   [facility] Providers Cardiologist:  [name], MD     Referring MD: Shirline Frees, NP   Chief Complaint  Patient presents with   Follow-up    Seen for Dr. Swaziland    History of Present Illness:    [name] [name] is a 77 y.o. male with a hx of hypertension, hyperlipidemia, DM2, CKD and history of CVA.  Patient had a CVA in 2019 and had a loop recorder placed which has not shown any significant arrhythmia.  Echocardiogram and a TEE in 2019 were normal.  Patient did have moderate carotid artery disease being followed by vascular surgery in [place].  When he was seen in late 2023 by Dr. Swaziland, he complained of dyspnea on exertion.  He was under a lot of stress taking care of his daughter who was quite ill at the time.  Subsequent Myoview obtained on 06/09/2022 demonstrated no evidence of infarction with ischemia, small defect of mild reduction in uptake present in the apical apex location consistent with artifact, EF 47%.  Echocardiogram obtained on 07/05/2022 showed EF 45 to 50%, global hypokinesis, grade 2 DD, severe LAE, trivial MR mildly dilated ascending aorta measurement of 38 mm.  He was seen by Dr. Swaziland in January 2024 who recommended medical management given CKD.  He was felt not to be a candidate for Aldactone or SGLT2 inhibitor due to renal function.  He is on labetalol, valsartan and hydralazine.  Patient presents today for preoperative clearance prior to goniotomy by [facility].  He is dyspnea on exertion has not changed.  He has no lower extremity edema, orthopnea or PND.  He denies any chest pain.  He does complain of persistent shortness of breath with exertion that is unchanged from last year.  We ambulated the patient in the hallway and that his heart rate improved from 60s to 80s with exercise.  O2 saturation stayed around 96 to  97%.  He is at acceptable risk to proceed with eye surgery.  He does not need to hold aspirin for cataract surgery, however if more extensive surgery is needed, he may hold aspirin for 7 days prior to the procedure and restart as soon as possible afterward at the surgeon's discretion.  Patient can follow-up in 4 to 6 weeks.  He continued to have labile blood pressure, some of which I suspect is related to CKD stage IV.  Fortunately he does not appear to be volume overloaded on physical exam.   Past Medical History:  Diagnosis Date   Anemia    Cataract    DIABETES MELLITUS, TYPE II 01/02/2007   HYPERLIPIDEMIA 06/18/2007   HYPERTENSION 01/02/2007   RENAL INSUFFICIENCY 02/05/2008   Stroke (HCC) 2019    Past Surgical History:  Procedure Laterality Date   LOOP RECORDER INSERTION N/A 07/11/2017   Procedure: LOOP RECORDER INSERTION;  Surgeon: Hillis Range, MD;  Location: [facility] INVASIVE CV LAB;  Service: Cardiovascular;  Laterality: N/A;   SHOULDER SURGERY     left   TEE WITHOUT CARDIOVERSION N/A 07/11/2017   Procedure: TRANSESOPHAGEAL ECHOCARDIOGRAM (TEE);  Surgeon: Lars Masson, MD;  Location: [facility] ENDOSCOPY;  Service: Cardiovascular;  Laterality: N/A;   TRANSURETHRAL RESECTION OF PROSTATE     history of retention/hematuria     Current Medications: Current Meds  Medication Sig   amLODipine (  NORVASC) 5 MG tablet Take 5 mg by mouth in the morning and at bedtime.   aspirin EC 81 MG tablet Take 1 tablet (81 mg total) by mouth daily. Swallow whole.   B Complex Vitamins (VITAMIN B COMPLEX) TABS Take by mouth.   Blood Glucose Monitoring Suppl (ONETOUCH VERIO) w/Device KIT Use with existing test strips   brimonidine (ALPHAGAN) 0.2 % ophthalmic solution SMARTSIG:In Eye(s)   calcitRIOL (ROCALTROL) 0.25 MCG capsule Take 1 capsule by mouth every other day.   Cholecalciferol 50 MCG (2000 UT) CAPS Take 1 capsule by mouth daily.   COVID-19 mRNA vaccine 2023-2024 (COMIRNATY) syringe Inject 0.3 mLs into  the muscle.   ezetimibe (ZETIA) 10 MG tablet TAKE 1 TABLET(10 MG) BY MOUTH DAILY   ferrous sulfate 325 (65 FE) MG tablet Take 325 mg by mouth 2 (two) times daily with a meal.   hydrALAZINE (APRESOLINE) 100 MG tablet Take 100 mg by mouth 3 (three) times daily.   labetalol (NORMODYNE) 200 MG tablet TAKE 1 TABLET(200 MG) BY MOUTH THREE TIMES DAILY   LANTUS SOLOSTAR 100 UNIT/ML Solostar Pen ADMINISTER 9 UNITS UNDER THE SKIN TWICE DAILY (Patient taking differently: Inject 9 unit under skin the morning and 3 units in the evening)   latanoprost (XALATAN) 0.005 % ophthalmic solution 1 drop at bedtime.   nateglinide (STARLIX) 60 MG tablet TAKE 1 TABLET BY MOUTH ONCE A DAY WITH MEALS   ONETOUCH VERIO test strip USE TO TEST TWICE DAILY AS DIRECTED   timolol (TIMOPTIC) 0.5 % ophthalmic solution 1 drop 2 (two) times daily.   valsartan (DIOVAN) 160 MG tablet Take 80 mg by mouth 2 (two) times daily.     Allergies:   Evolocumab, 5-alpha reductase inhibitors, Bactrim [sulfamethoxazole-trimethoprim], Penicillins, Statins, Sulfa antibiotics, Sulfa drugs cross reactors, and Sulfamethoxazole-trimethoprim   Social History   Socioeconomic History   Marital status: Married    Spouse name: Not on file   Number of children: Not on file   Years of education: Not on file   Highest education level: Not on file  Occupational History   Not on file  Tobacco Use   Smoking status: Never   Smokeless tobacco: Never  Vaping Use   Vaping Use: Never used  Substance and Sexual Activity   Alcohol use: No   Drug use: No   Sexual activity: Not on file  Other Topics Concern   Not on file  Social History Narrative   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 Likes to go to football games.    Social Determinants of Health   Financial Resource Strain: Low Risk  (02/14/2022)   Overall Financial Resource Strain  (CARDIA)    Difficulty of Paying Living Expenses: Not hard at all  Food Insecurity: No Food Insecurity (11/04/2022)   Hunger Vital Sign    Worried About Running Out of Food in the Last Year: Never true    Ran Out of Food in the Last Year: Never true  Transportation Needs: No Transportation Needs (08/17/2022)   PRAPARE - Administrator, Civil Service (Medical): No    Lack of Transportation (Non-Medical): No  Physical Activity: Inactive (08/17/2022)   Exercise Vital Sign    Days of Exercise per Week: 0 days    Minutes of Exercise per Session: 0 min  Stress: No Stress Concern  Present (08/17/2022)   [employer] of Occupational Health - Occupational Stress Questionnaire    Feeling of Stress : Not at all  Social Connections: Socially Integrated (08/17/2022)   Social Connection and Isolation Panel [NHANES]    Frequency of Communication with Friends and Family: More than three times a week    Frequency of Social Gatherings with Friends and Family: More than three times a week    Attends Religious Services: More than 4 times per year    Active Member of [employer] or Organizations: Yes    Attends Engineer, structural: More than 4 times per year    Marital Status: Married     Family History: The patient's family history includes Diabetes in his mother; Hypertension in his mother; Stroke in his father and maternal grandmother. There is no history of Colon cancer, Esophageal cancer, Rectal cancer, or Stomach cancer.  ROS:   Please see the history of present illness.     All other systems reviewed and are negative.  EKGs/Labs/Other Studies Reviewed:    The following studies were reviewed today:  Echo 07/05/2022 1. Left ventricular ejection fraction, by estimation, is 45 to 50%. Left  ventricular ejection fraction by 3D volume is 45 %. The left ventricle has  mildly decreased function. The left ventricle demonstrates global  hypokinesis. The left ventricular  internal cavity  size was mildly dilated. Left ventricular diastolic  parameters are consistent with Grade II diastolic dysfunction  (pseudonormalization). Elevated left ventricular end-diastolic pressure.   2. Right ventricular systolic function is normal. The right ventricular  size is normal.   3. Left atrial size was severely dilated.   4. The mitral valve is normal in structure. Trivial mitral valve  regurgitation. No evidence of mitral stenosis.   5. The aortic valve is tricuspid. There is moderate calcification of the  aortic valve. There is moderate thickening of the aortic valve. Aortic  valve regurgitation is not visualized. Aortic valve  sclerosis/calcification is present, without any evidence  of aortic stenosis.   6. Aortic dilatation noted. There is mild dilatation of the ascending  aorta, measuring 38 mm.   7. The inferior vena cava is dilated in size with >50% respiratory  variability, suggesting right atrial pressure of 8 mmHg.   EKG:  EKG is ordered today.  The ekg ordered today demonstrates normal sinus rhythm, no significant ST-T wave changes.  Recent Labs: 03/16/2022: ALT 11; TSH 2.86 06/03/2022: BUN 43; Creatinine, Ser 3.09; Hemoglobin 11.8; Platelets 270.0; Potassium 3.6; Sodium 141  Recent Lipid Panel    Component Value Date/Time   CHOL 181 03/16/2022 0744   TRIG 135.0 03/16/2022 0744   TRIG 323 (HH) 06/13/2006 1050   HDL 41.60 03/16/2022 0744   CHOLHDL 4 03/16/2022 0744   VLDL 27.0 03/16/2022 0744   LDLCALC 113 (H) 03/16/2022 0744   LDLCALC 98 01/15/2020 0811   LDLDIRECT 180.7 06/24/2013 1106     Risk Assessment/Calculations:           Physical Exam:    VS:  BP 130/60   Pulse 61   Ht 6\' 3"  (1.905 m)   Wt 230 lb 12.8 oz (104.7 kg)   SpO2 98%   BMI 28.85 kg/m         Wt Readings from Last 3 Encounters:  11/23/22 230 lb 12.8 oz (104.7 kg)  11/03/22 229 lb (103.9 kg)  08/17/22 230 lb (104.3 kg)     GEN:  Well nourished, well developed in no acute  distress HEENT: Normal NECK: No JVD; No carotid bruits LYMPHATICS: No lymphadenopathy CARDIAC: RRR, no murmurs, rubs, gallops RESPIRATORY:  Clear to auscultation without rales, wheezing or rhonchi  ABDOMEN: Soft, non-tender, non-distended MUSCULOSKELETAL:  No edema; No deformity  SKIN: Warm and dry NEUROLOGIC:  Alert and oriented x 3 PSYCHIATRIC:  Normal affect   ASSESSMENT:    1. Pre-operative clearance   2. DOE (dyspnea on exertion)   3. Primary hypertension   4. Hyperlipidemia LDL goal <70   5. Controlled type 2 diabetes mellitus without complication, without long-term current use of insulin (HCC)   6. CKD (chronic kidney disease) stage 4, GFR 15-29 ml/min (HCC)    PLAN:    In order of problems listed above:  Preoperative clearance: He denies any recent chest pain.  Upcoming eye surgery is low risk, he may proceed from the cardiac perspective without additional testing  Chronic dyspnea on exertion: Unchanged recently.  We ambulated the patient in the hallway, O2 saturation remained 96-97% with ambulation.  He has normal heart rate response with exercise.  Will continue observation  Hypertension: Blood pressure stable  Hyperlipidemia: On Zetia  DM2: Managed by primary care provider  CKD stage IV: Followed by nephrology service.           Medication Adjustments/Labs and Tests Ordered: Current medicines are reviewed at length with the patient today.  Concerns regarding medicines are outlined above.  Orders Placed This Encounter  Procedures   EKG 12-Lead   No orders of the defined types were placed in this encounter.   Patient Instructions  Medication Instructions:  No Changes *If you need a refill on your cardiac medications before your next appointment, please call your pharmacy*   Lab Work: No labs If you have labs (blood work) drawn today and your tests are completely normal, you will receive your results only by: MyChart Message (if you have MyChart)  OR A paper copy in the mail If you have any lab test that is abnormal or we need to change your treatment, we will call you to review the results.   Testing/Procedures: No Testing   Follow-Up: At [facility], you and your health needs are our priority.  As part of our continuing mission to provide you with exceptional heart care, we have created designated Provider Care Teams.  These Care Teams include your primary Cardiologist (physician) and Advanced Practice Providers (APPs -  Physician Assistants and Nurse Practitioners) who all work together to provide you with the care you need, when you need it.  We recommend signing up for the patient portal called "MyChart".  Sign up information is provided on this After Visit Summary.  MyChart is used to connect with patients for Virtual Visits (Telemedicine).  Patients are able to view lab/test results, encounter notes, upcoming appointments, etc.  Non-urgent messages can be sent to your provider as well.   To learn more about what you can do with MyChart, go to [email or web address].    Your next appointment:   4-35month(s)  Provider:   [name], MD     Signed, Azalee Course, [place]  11/24/2022 11:13 PM    [facility]

## 2022-11-23 NOTE — Telephone Encounter (Signed)
   Patient Name: [name] [name]  DOB: [birth year] MRN: [id number]  Primary Cardiologist: [name], MD  Chart reviewed as part of pre-operative protocol coverage. Given past medical history and time since last visit, based on ACC/AHA guidelines, [name] [name] is at acceptable risk for the planned procedure without further cardiovascular testing.   Patient was seen today in the cardiology office for preoperative clearance.  His dyspnea on exertion has not changed.  Recent Lexiscan nuclear stress test and echocardiogram were reassuring.  He is at acceptable risk to proceed from the cardiac perspective.  He does not need to hold aspirin for cataract surgery, however if more extensive surgery is needed, he may hold aspirin for 7 days prior to the procedure and restart as soon as possible afterward at the surgeon's discretion.  The patient was advised that if he develops new symptoms prior to surgery to contact our office to arrange for a follow-up visit, and he verbalized understanding.  I will route this recommendation to the requesting party via Epic fax function and remove from pre-op pool.  Please call with questions.  Gutierrez, [place] 11/23/2022, 2:41 PM

## 2022-11-23 NOTE — Patient Instructions (Signed)
Medication Instructions:  No Changes *If you need a refill on your cardiac medications before your next appointment, please call your pharmacy*   Lab Work: No labs If you have labs (blood work) drawn today and your tests are completely normal, you will receive your results only by: MyChart Message (if you have MyChart) OR A paper copy in the mail If you have any lab test that is abnormal or we need to change your treatment, we will call you to review the results.   Testing/Procedures: No Testing   Follow-Up: At [facility], you and your health needs are our priority.  As part of our continuing mission to provide you with exceptional heart care, we have created designated Provider Care Teams.  These Care Teams include your primary Cardiologist (physician) and Advanced Practice Providers (APPs -  Physician Assistants and Nurse Practitioners) who all work together to provide you with the care you need, when you need it.  We recommend signing up for the patient portal called "MyChart".  Sign up information is provided on this After Visit Summary.  MyChart is used to connect with patients for Virtual Visits (Telemedicine).  Patients are able to view lab/test results, encounter notes, upcoming appointments, etc.  Non-urgent messages can be sent to your provider as well.   To learn more about what you can do with MyChart, go to [email or web address].    Your next appointment:   4-46month(s)  Provider:   [name], MD

## 2022-11-24 ENCOUNTER — Encounter: Payer: Self-pay | Admitting: Physician Assistant

## 2022-12-13 DIAGNOSIS — H2512 Age-related nuclear cataract, left eye: Secondary | ICD-10-CM | POA: Diagnosis not present

## 2022-12-13 DIAGNOSIS — H401122 Primary open-angle glaucoma, left eye, moderate stage: Secondary | ICD-10-CM | POA: Diagnosis not present

## 2022-12-14 DIAGNOSIS — Z961 Presence of intraocular lens: Secondary | ICD-10-CM | POA: Diagnosis not present

## 2022-12-20 ENCOUNTER — Other Ambulatory Visit: Payer: Self-pay

## 2022-12-20 MED ORDER — LABETALOL HCL 200 MG PO TABS: ORAL_TABLET | ORAL | 3 refills | Status: DC

## 2022-12-27 DIAGNOSIS — H2511 Age-related nuclear cataract, right eye: Secondary | ICD-10-CM | POA: Diagnosis not present

## 2023-01-04 ENCOUNTER — Other Ambulatory Visit: Payer: Self-pay | Admitting: Adult Health

## 2023-01-04 DIAGNOSIS — E1122 Type 2 diabetes mellitus with diabetic chronic kidney disease: Secondary | ICD-10-CM

## 2023-01-09 ENCOUNTER — Telehealth: Payer: Self-pay | Admitting: Adult Health

## 2023-01-09 NOTE — Telephone Encounter (Signed)
[name] [name] with triage nurse called in and stated pt is lightheaded, dizzy and has chest discomfort. She recommended pt go to the ED, pt has refused. I let RN Selena Batten know msg would be sent back to careteam.

## 2023-01-10 NOTE — Telephone Encounter (Signed)
Spoke with pt spouse and she stated she declined the ED. She stated that the people in the hospital would not know pt hx. I advised that charts could be viewed by ED providers. She also informed me that pt has been sleeping and  has swelling in legs and developed a dry cough. I advised that pt needs immediate help and that we did not have any appts. She stated that she will try to get him to go to the [facility] but wanted me to schedule pt for ov for tomorrow. Pt has been scheduled.

## 2023-01-11 ENCOUNTER — Encounter: Payer: Self-pay | Admitting: Family Medicine

## 2023-01-11 ENCOUNTER — Ambulatory Visit (INDEPENDENT_AMBULATORY_CARE_PROVIDER_SITE_OTHER): Payer: Medicare Other | Admitting: Family Medicine

## 2023-01-11 VITALS — BP 130/78 | HR 65 | Temp 98.4°F | Wt 233.0 lb

## 2023-01-11 DIAGNOSIS — E1169 Type 2 diabetes mellitus with other specified complication: Secondary | ICD-10-CM

## 2023-01-11 DIAGNOSIS — I10 Essential (primary) hypertension: Secondary | ICD-10-CM | POA: Diagnosis not present

## 2023-01-11 DIAGNOSIS — Z794 Long term (current) use of insulin: Secondary | ICD-10-CM | POA: Diagnosis not present

## 2023-01-11 DIAGNOSIS — E162 Hypoglycemia, unspecified: Secondary | ICD-10-CM

## 2023-01-11 DIAGNOSIS — E669 Obesity, unspecified: Secondary | ICD-10-CM

## 2023-01-11 DIAGNOSIS — R0782 Intercostal pain: Secondary | ICD-10-CM | POA: Diagnosis not present

## 2023-01-11 NOTE — Progress Notes (Signed)
   Subjective:    Patient ID: [name] [name], male    DOB: 11-01-1945, 77 y.o.   MRN: [id number]  HPI Here with his wife for 2 issues. First he often has spells during the night where is gets sweaty and shaky. If he checks his glucose during these spells, it is always low. Several nights ago it was 66. These spells never happen during the day. He takes Lantus 9 units in the AM and 3 units in the PM, as well as Nateglinide 60 mg in the evenings. He also asks about a mild sharp pain in the right chest that started as he was getting up out of a lounge chair last Friday. No SOB. The pain has been gradually easing up, and this morning he can barely feel it. His BP has been stable.    Review of Systems  Constitutional:  Positive for diaphoresis.  Respiratory: Negative.    Cardiovascular:  Positive for chest pain. Negative for palpitations and leg swelling.  Neurological: Negative.        Objective:   Physical Exam Constitutional:      Appearance: Normal appearance. He is not ill-appearing.  Cardiovascular:     Rate and Rhythm: Normal rate and regular rhythm.     Pulses: Normal pulses.     Heart sounds: Normal heart sounds.  Pulmonary:     Effort: Pulmonary effort is normal.     Breath sounds: Normal breath sounds.  Chest:     Chest wall: No tenderness.  Neurological:     General: No focal deficit present.     Mental Status: He is alert and oriented to person, place, and time.           Assessment & Plan:  He is having hypoglycemic spells at night, so agreed to change his Nateglinide to dosing it in the mornings rather than evenings. The chest pain is a musculoskeletal pain, and it is resolving as expected. I asked him to report back to [place] what his glucose readings are doing in a week. Gershon Crane, MD

## 2023-01-12 ENCOUNTER — Ambulatory Visit: Payer: Medicare Other | Admitting: Physician Assistant

## 2023-01-31 DIAGNOSIS — D649 Anemia, unspecified: Secondary | ICD-10-CM | POA: Diagnosis not present

## 2023-01-31 DIAGNOSIS — E211 Secondary hyperparathyroidism, not elsewhere classified: Secondary | ICD-10-CM | POA: Diagnosis not present

## 2023-01-31 DIAGNOSIS — E1169 Type 2 diabetes mellitus with other specified complication: Secondary | ICD-10-CM | POA: Diagnosis not present

## 2023-01-31 DIAGNOSIS — D6489 Other specified anemias: Secondary | ICD-10-CM | POA: Diagnosis not present

## 2023-01-31 DIAGNOSIS — N189 Chronic kidney disease, unspecified: Secondary | ICD-10-CM | POA: Diagnosis not present

## 2023-01-31 DIAGNOSIS — I10 Essential (primary) hypertension: Secondary | ICD-10-CM | POA: Diagnosis not present

## 2023-01-31 DIAGNOSIS — M1000 Idiopathic gout, unspecified site: Secondary | ICD-10-CM | POA: Diagnosis not present

## 2023-01-31 DIAGNOSIS — R309 Painful micturition, unspecified: Secondary | ICD-10-CM | POA: Diagnosis not present

## 2023-01-31 DIAGNOSIS — N184 Chronic kidney disease, stage 4 (severe): Secondary | ICD-10-CM | POA: Diagnosis not present

## 2023-01-31 DIAGNOSIS — E559 Vitamin D deficiency, unspecified: Secondary | ICD-10-CM | POA: Diagnosis not present

## 2023-01-31 DIAGNOSIS — R809 Proteinuria, unspecified: Secondary | ICD-10-CM | POA: Diagnosis not present

## 2023-02-16 ENCOUNTER — Encounter (INDEPENDENT_AMBULATORY_CARE_PROVIDER_SITE_OTHER): Payer: Self-pay

## 2023-03-17 ENCOUNTER — Other Ambulatory Visit: Payer: Self-pay | Admitting: Adult Health

## 2023-03-17 DIAGNOSIS — E119 Type 2 diabetes mellitus without complications: Secondary | ICD-10-CM

## 2023-03-21 ENCOUNTER — Encounter: Payer: Self-pay | Admitting: Adult Health

## 2023-03-21 ENCOUNTER — Ambulatory Visit (INDEPENDENT_AMBULATORY_CARE_PROVIDER_SITE_OTHER): Payer: Medicare Other | Admitting: Adult Health

## 2023-03-21 VITALS — BP 140/80 | HR 64 | Temp 98.3°F | Ht 73.0 in | Wt 227.0 lb

## 2023-03-21 DIAGNOSIS — M791 Myalgia, unspecified site: Secondary | ICD-10-CM | POA: Diagnosis not present

## 2023-03-21 DIAGNOSIS — D631 Anemia in chronic kidney disease: Secondary | ICD-10-CM | POA: Diagnosis not present

## 2023-03-21 DIAGNOSIS — Z8673 Personal history of transient ischemic attack (TIA), and cerebral infarction without residual deficits: Secondary | ICD-10-CM

## 2023-03-21 DIAGNOSIS — I129 Hypertensive chronic kidney disease with stage 1 through stage 4 chronic kidney disease, or unspecified chronic kidney disease: Secondary | ICD-10-CM

## 2023-03-21 DIAGNOSIS — Z794 Long term (current) use of insulin: Secondary | ICD-10-CM | POA: Diagnosis not present

## 2023-03-21 DIAGNOSIS — E119 Type 2 diabetes mellitus without complications: Secondary | ICD-10-CM | POA: Diagnosis not present

## 2023-03-21 DIAGNOSIS — T466X5A Adverse effect of antihyperlipidemic and antiarteriosclerotic drugs, initial encounter: Secondary | ICD-10-CM | POA: Diagnosis not present

## 2023-03-21 DIAGNOSIS — Z0000 Encounter for general adult medical examination without abnormal findings: Secondary | ICD-10-CM | POA: Diagnosis not present

## 2023-03-21 DIAGNOSIS — N184 Chronic kidney disease, stage 4 (severe): Secondary | ICD-10-CM | POA: Diagnosis not present

## 2023-03-21 DIAGNOSIS — I63512 Cerebral infarction due to unspecified occlusion or stenosis of left middle cerebral artery: Secondary | ICD-10-CM

## 2023-03-21 DIAGNOSIS — E782 Mixed hyperlipidemia: Secondary | ICD-10-CM

## 2023-03-21 DIAGNOSIS — Z125 Encounter for screening for malignant neoplasm of prostate: Secondary | ICD-10-CM

## 2023-03-21 DIAGNOSIS — Z7984 Long term (current) use of oral hypoglycemic drugs: Secondary | ICD-10-CM

## 2023-03-21 LAB — COMPREHENSIVE METABOLIC PANEL
ALT: 10 U/L (ref 0–53)
AST: 12 U/L (ref 0–37)
Albumin: 4.2 g/dL (ref 3.5–5.2)
Alkaline Phosphatase: 55 U/L (ref 39–117)
BUN: 42 mg/dL — ABNORMAL HIGH (ref 6–23)
CO2: 23 meq/L (ref 19–32)
Calcium: 9.3 mg/dL (ref 8.4–10.5)
Chloride: 110 meq/L (ref 96–112)
Creatinine, Ser: 3.12 mg/dL — ABNORMAL HIGH (ref 0.40–1.50)
GFR: 18.69 mL/min — ABNORMAL LOW (ref >60.00)
Glucose, Bld: 152 mg/dL — ABNORMAL HIGH (ref 70–99)
Potassium: 4.5 meq/L (ref 3.5–5.1)
Sodium: 140 meq/L (ref 135–145)
Total Bilirubin: 0.6 mg/dL (ref 0.2–1.2)
Total Protein: 6.9 g/dL (ref 6.0–8.3)

## 2023-03-21 LAB — IBC + FERRITIN
Ferritin: 80.2 ng/mL (ref 22.0–322.0)
Iron: 58 ug/dL (ref 42–165)
Saturation Ratios: 19.8 % — ABNORMAL LOW (ref 20.0–50.0)
TIBC: 292.6 ug/dL (ref 250.0–450.0)
Transferrin: 209 mg/dL — ABNORMAL LOW (ref 212.0–360.0)

## 2023-03-21 LAB — LIPID PANEL
Cholesterol: 170 mg/dL (ref 0–200)
HDL: 39.9 mg/dL (ref >39.00)
LDL Cholesterol: 98 mg/dL (ref 0–99)
NonHDL: 130.33
Total CHOL/HDL Ratio: 4
Triglycerides: 160 mg/dL — ABNORMAL HIGH (ref 0.0–149.0)
VLDL: 32 mg/dL (ref 0.0–40.0)

## 2023-03-21 LAB — PSA: PSA: 0.65 ng/mL (ref 0.10–4.00)

## 2023-03-21 LAB — CBC
HCT: 36.4 % — ABNORMAL LOW (ref 39.0–52.0)
Hemoglobin: 11.7 g/dL — ABNORMAL LOW (ref 13.0–17.0)
MCHC: 32.2 g/dL (ref 30.0–36.0)
MCV: 84.4 fl (ref 78.0–100.0)
Platelets: 264 10*3/uL (ref 150.0–400.0)
RBC: 4.31 Mil/uL (ref 4.22–5.81)
RDW: 14.9 % (ref 11.5–15.5)
WBC: 7.6 10*3/uL (ref 4.0–10.5)

## 2023-03-21 LAB — HEMOGLOBIN A1C: Hgb A1c MFr Bld: 6.9 % — ABNORMAL HIGH (ref 4.6–6.5)

## 2023-03-21 LAB — MICROALBUMIN / CREATININE URINE RATIO
Creatinine,U: 138.9 mg/dL
Microalb Creat Ratio: 14.3 mg/g (ref 0.0–30.0)
Microalb, Ur: 19.9 mg/dL — ABNORMAL HIGH (ref 0.0–1.9)

## 2023-03-21 LAB — TSH: TSH: 1.98 u[IU]/mL (ref 0.35–5.50)

## 2023-03-21 LAB — VITAMIN B12: Vitamin B-12: 713 pg/mL (ref 211–911)

## 2023-03-21 NOTE — Progress Notes (Signed)
Subjective:    Patient ID: [name] [name], male    DOB: [birth year], 77 y.o.   MRN: [id number]  HPI Patient presents for yearly preventative medicine examination. He is a pleasant 77 year old male who  has a past medical history of Anemia, Cataract, DIABETES MELLITUS, TYPE II (01/02/2007), HYPERLIPIDEMIA (06/18/2007), HYPERTENSION (01/02/2007), RENAL INSUFFICIENCY (02/05/2008), and Stroke (HCC) (2019).  Diabetes mellitus type 2-managed with Starlix 60 mg daily and Lantus 9 units in the morning . He does check his blood sugar at home and reports reading in the 70-150's. Lunch is his biggest meal.  Lab Results  Component Value Date   HGBA1C 6.7 (A) 11/03/2022   HGBA1C 7.1 (A) 08/11/2022   HGBA1C 6.6 (A) 06/03/2022   Hypertension with chronic kidney disease stage IV-managed by nephrology.  He is currently prescribed hydralazine 50 mg 3 times daily, Norvasc 5 mg daily, valsartan 160 mg daily, and labetalol 200 mg 3 times daily.  He does check his blood pressure at home with readings and they report readings in the 170's in the mornings and 120's in the evening.  He was last seen by Nephrology in July 2024 with a GFR 19 and Cr 3.23.  BP Readings from Last 3 Encounters:  03/21/23 (!) 140/80  01/11/23 130/78  11/23/22 130/60   Hyperlipidemia-intolerant to statins due to myalgia.  He does take Zetia 10 mg daily without any side effects Lab Results  Component Value Date   CHOL 181 03/16/2022   HDL 41.60 03/16/2022   LDLCALC 113 (H) 03/16/2022   LDLDIRECT 180.7 06/24/2013   TRIG 135.0 03/16/2022   CHOLHDL 4 03/16/2022   Deficiency anemia-managed by hematology every 6 months.  He has received IV iron infusions in the past.  Currently taking OTC iron B12 supplementation Lab Results  Component Value Date   IRON 82 03/16/2022   TIBC 264.6 03/16/2022   FERRITIN 70.4 03/16/2022   Lab Results  Component Value Date   VITAMINB12 550 03/16/2022   History of left middle cerebral  stroke-followed by neurology every 6 months.  Has no residual deficit.  He does have a loop recorder in aspirin 325 mg daily and Zetia for secondary stroke prevention  PVD- is managed by vein and vascular - is seen yearly now.    All immunizations and health maintenance protocols were reviewed with the patient and needed orders were placed.  Appropriate screening laboratory values were ordered for the patient including screening of hyperlipidemia, renal function and hepatic function. If indicated by BPH, a PSA was ordered.  Medication reconciliation,  past medical history, social history, problem list and allergies were reviewed in detail with the patient  Goals were established with regard to weight loss, exercise, and  diet in compliance with medications Wt Readings from Last 3 Encounters:  03/21/23 227 lb (103 kg)  01/11/23 233 lb (105.7 kg)  11/23/22 230 lb 12.8 oz (104.7 kg)    Review of Systems  Constitutional: Negative.   HENT: Negative.    Eyes: Negative.   Respiratory: Negative.    Cardiovascular: Negative.   Gastrointestinal: Negative.   Endocrine: Negative.   Genitourinary: Negative.   Musculoskeletal: Negative.   Skin: Negative.   Allergic/Immunologic: Negative.   Neurological: Negative.   Hematological: Negative.   Psychiatric/Behavioral: Negative.    All other systems reviewed and are negative.  Past Medical History:  Diagnosis Date   Anemia    Cataract    DIABETES MELLITUS, TYPE II 01/02/2007   HYPERLIPIDEMIA 06/18/2007  HYPERTENSION 01/02/2007   RENAL INSUFFICIENCY 02/05/2008   Stroke [facility]) 2019    Social History   Socioeconomic History   Marital status: Married    Spouse name: Not on file   Number of children: Not on file   Years of education: Not on file   Highest education level: Not on file  Occupational History   Not on file  Tobacco Use   Smoking status: Never   Smokeless tobacco: Never  Vaping Use   Vaping status: Never Used   Substance and Sexual Activity   Alcohol use: No   Drug use: No   Sexual activity: Not on file  Other Topics Concern   Not on file  Social History Narrative   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 Likes to go to football games.    Social Determinants of Health   Financial Resource Strain: Low Risk  (02/14/2022)   Overall Financial Resource Strain (CARDIA)    Difficulty of Paying Living Expenses: Not hard at all  Food Insecurity: No Food Insecurity (11/04/2022)   Hunger Vital Sign    Worried About Running Out of Food in the Last Year: Never true    Ran Out of Food in the Last Year: Never true  Transportation Needs: No Transportation Needs (08/17/2022)   PRAPARE - Administrator, Civil Service (Medical): No    Lack of Transportation (Non-Medical): No  Physical Activity: Inactive (08/17/2022)   Exercise Vital Sign    Days of Exercise per Week: 0 days    Minutes of Exercise per Session: 0 min  Stress: No Stress Concern Present (08/17/2022)   [employer] of Occupational Health - Occupational Stress Questionnaire    Feeling of Stress : Not at all  Social Connections: Socially Integrated (08/17/2022)   Social Connection and Isolation Panel [NHANES]    Frequency of Communication with Friends and Family: More than three times a week    Frequency of Social Gatherings with Friends and Family: More than three times a week    Attends Religious Services: More than 4 times per year    Active Member of Clubs or Organizations: Yes    Attends Banker Meetings: More than 4 times per year    Marital Status: Married  Catering manager Violence: Not At Risk (08/17/2022)   Humiliation, Afraid, Rape, and Kick questionnaire    Fear of Current or Ex-Partner: No    Emotionally Abused: No    Physically Abused: No    Sexually Abused: No    Past Surgical History:   Procedure Laterality Date   LOOP RECORDER INSERTION N/A 07/11/2017   Procedure: LOOP RECORDER INSERTION;  Surgeon: Hillis Range, MD;  Location: [facility]VAS[facility];  Service: Cardiovascular;  Laterality: N/A;   SHOULDER SURGERY     left   TEE WITHOUT CARDIOVERSION N/A 07/11/2017   Procedure: TRANSESOPHAGEAL ECHOCARDIOGRAM (TEE);  Surgeon: Lars Masson, MD;  Location: [facility] ENDOSCOPY;  Service: Cardiovascular;  Laterality: N/A;   TRANSURETHRAL RESECTION OF PROSTATE     history of retention/hematuria     Family History  Problem Relation Age of Onset   Hypertension Mother    Diabetes Mother    Stroke Father    Stroke Maternal Grandmother    Colon cancer Neg Hx    Esophageal cancer Neg Hx  Rectal cancer Neg Hx    Stomach cancer Neg Hx     Allergies  Allergen Reactions   Evolocumab Hypertension    Other reaction(s): Other (see comments)   5-Alpha Reductase Inhibitors Other (See Comments)    myalgia Other reaction(s): Other (see comments)   Bactrim [Sulfamethoxazole-Trimethoprim] Hives, Itching and Swelling   Penicillins Hives    Has patient had a PCN reaction causing immediate rash, facial/tongue/throat swelling, SOB or lightheadedness with hypotension: Unk Has patient had a PCN reaction causing severe rash involving mucus membranes or skin necrosis: Unk Has patient had a PCN reaction that required hospitalization: Unk Has patient had a PCN reaction occurring within the last 10 years: No If all of the above answers are "NO", then may proceed with Cephalosporin use. Other reaction(s): Other (see comments)   Statins     myalgia Other reaction(s): Other (see comments)   Sulfa Antibiotics     Other reaction(s): Other (see comments)   Sulfa Drugs Cross Reactors Hives, Itching and Swelling   Sulfamethoxazole-Trimethoprim Hives and Itching    Other reaction(s): Other (see comments)    Current Outpatient Medications on File Prior to Visit  Medication Sig Dispense Refill    amLODipine (NORVASC) 5 MG tablet Take 5 mg by mouth in the morning and at bedtime.     aspirin EC 81 MG tablet Take 1 tablet (81 mg total) by mouth daily. Swallow whole. 90 tablet 3   B Complex Vitamins (VITAMIN B COMPLEX) TABS Take by mouth.     Blood Glucose Monitoring Suppl (ONETOUCH VERIO) w/Device KIT Use with existing test strips 1 kit kit   calcitRIOL (ROCALTROL) 0.25 MCG capsule Take 1 capsule by mouth every other day.     Cholecalciferol 50 MCG (2000 UT) CAPS Take 1 capsule by mouth daily.     ezetimibe (ZETIA) 10 MG tablet TAKE 1 TABLET(10 MG) BY MOUTH DAILY 90 tablet 3   ferrous sulfate 325 (65 FE) MG tablet Take 325 mg by mouth 2 (two) times daily with a meal.     hydrALAZINE (APRESOLINE) 100 MG tablet Take 100 mg by mouth 3 (three) times daily.     labetalol (NORMODYNE) 200 MG tablet TAKE 1 TABLET(200 MG) BY MOUTH THREE TIMES DAILY 270 tablet 3   LANTUS SOLOSTAR 100 UNIT/ML Solostar Pen ADMINISTER 9 UNITS UNDER THE SKIN TWICE DAILY (Patient taking differently: Takes 9 units in AM and 3 units at PM) 15 mL 0   latanoprost (XALATAN) 0.005 % ophthalmic solution 1 drop at bedtime.     nateglinide (STARLIX) 60 MG tablet TAKE 1 TABLET BY MOUTH ONCE A DAY WITH MEALS     ONETOUCH VERIO test strip USE TO TEST TWICE DAILY AS DIRECTED 200 strip 2   timolol (TIMOPTIC) 0.5 % ophthalmic solution 1 drop 2 (two) times daily.     valsartan (DIOVAN) 160 MG tablet Take 80 mg by mouth 2 (two) times daily.     No current facility-administered medications on file prior to visit.    BP (!) 140/80   Pulse 64   Temp 98.3 F (36.8 C) (Oral)   Ht 6\' 1"  (1.854 m)   Wt 227 lb (103 kg)   SpO2 98%   BMI 29.95 kg/m       Objective:   Physical Exam Vitals and nursing note reviewed.  Constitutional:      General: He is not in acute distress.    Appearance: Normal appearance. He is not ill-appearing.  HENT:  Head: Normocephalic and atraumatic.     Right Ear: Tympanic membrane, ear canal and  external ear normal. There is no impacted cerumen.     Left Ear: Tympanic membrane, ear canal and external ear normal. There is no impacted cerumen.     Nose: Nose normal. No congestion or rhinorrhea.     Mouth/Throat:     Mouth: Mucous membranes are moist.     Pharynx: Oropharynx is clear.  Eyes:     Extraocular Movements: Extraocular movements intact.     Conjunctiva/sclera: Conjunctivae normal.     Pupils: Pupils are equal, round, and reactive to light.  Neck:     Vascular: No carotid bruit.  Cardiovascular:     Rate and Rhythm: Normal rate and regular rhythm.     Pulses: Normal pulses.     Heart sounds: No murmur heard.    No friction rub. No gallop.  Pulmonary:     Effort: Pulmonary effort is normal.     Breath sounds: Normal breath sounds.  Abdominal:     General: Abdomen is flat. Bowel sounds are normal. There is no distension.     Palpations: Abdomen is soft. There is no mass.     Tenderness: There is no abdominal tenderness. There is no guarding or rebound.     Hernia: No hernia is present.  Musculoskeletal:        General: Normal range of motion.     Cervical back: Normal range of motion and neck supple.  Lymphadenopathy:     Cervical: No cervical adenopathy.  Skin:    General: Skin is warm and dry.     Capillary Refill: Capillary refill takes less than 2 seconds.  Neurological:     General: No focal deficit present.     Mental Status: He is alert and oriented to person, place, and time.  Psychiatric:        Mood and Affect: Mood normal.        Behavior: Behavior normal.        Thought Content: Thought content normal.        Judgment: Judgment normal.        Assessment & Plan:   1. Routine general medical examination at a health care facility Today patient counseled on age appropriate routine health concerns for screening and prevention, each reviewed and up to date or declined. Immunizations reviewed and up to date or declined. Labs ordered and reviewed.  Risk factors for depression reviewed and negative. Hearing function and visual acuity are intact. ADLs screened and addressed as needed. Functional ability and level of safety reviewed and appropriate. Education, counseling and referrals performed based on assessed risks today. Patient provided with a copy of personalized plan for preventive services. - Follow up in one year or sooner - Continue to eat healthy and exercise  2. Diabetes mellitus treated with oral medication (HCC) - Continue with Starlix as directed  - Lipid panel; Future - TSH; Future - CBC; Future - Comprehensive metabolic panel; Future - Hemoglobin A1c; Future - Microalbumin/Creatinine Ratio, Urine; Future - Microalbumin/Creatinine Ratio, Urine - Hemoglobin A1c - Comprehensive metabolic panel - CBC - TSH - Lipid panel  3. Current use of insulin (HCC) - Consider change in insulin  - Lipid panel; Future - TSH; Future - CBC; Future - Comprehensive metabolic panel; Future - Hemoglobin A1c; Future - Microalbumin/Creatinine Ratio, Urine; Future - Microalbumin/Creatinine Ratio, Urine - Hemoglobin A1c - Comprehensive metabolic panel - CBC - TSH - Lipid panel  4.  Hypertensive kidney disease - Per nephrology  - Lipid panel; Future - TSH; Future - CBC; Future - Comprehensive metabolic panel; Future - Comprehensive metabolic panel - CBC - TSH - Lipid panel  5. Mixed hyperlipidemia - Continue with zetia  - Lipid panel; Future - TSH; Future - CBC; Future - Comprehensive metabolic panel; Future - Comprehensive metabolic panel - CBC - TSH - Lipid panel  6. Myalgia due to statin - Continue with Zetia   7. Left middle cerebral artery stroke [facility]) - Per neurology  - Lipid panel; Future - TSH; Future - CBC; Future - Comprehensive metabolic panel; Future - Comprehensive metabolic panel - CBC - TSH - Lipid panel  8. Prostate cancer screening  - PSA; Future - PSA  9. Anemia of chronic renal  failure, stage 4 (severe) (HCC) - Per hematology  - Lipid panel; Future - TSH; Future - CBC; Future - Comprehensive metabolic panel; Future - Vitamin B12; Future - IBC + Ferritin; Future - IBC + Ferritin - Vitamin B12 - Comprehensive metabolic panel - CBC - TSH - Lipid panel   Shirline Frees, NP

## 2023-03-21 NOTE — Patient Instructions (Addendum)
It was great seeing you today   We will follow up with you regarding your lab work   Please let me know if you need anything

## 2023-03-28 ENCOUNTER — Telehealth: Payer: Self-pay

## 2023-03-28 NOTE — Telephone Encounter (Signed)
  Spoke to pt spouse and she stated that her husband is "not right" she stated he got like this when he needed an infusion. She stated that pt is very fatigue, gait is off and pt is sleeping a lot. She is very worried.

## 2023-03-29 ENCOUNTER — Encounter: Payer: Self-pay | Admitting: Adult Health

## 2023-03-29 ENCOUNTER — Ambulatory Visit (INDEPENDENT_AMBULATORY_CARE_PROVIDER_SITE_OTHER): Payer: Medicare Other | Admitting: Adult Health

## 2023-03-29 VITALS — BP 140/80 | HR 62 | Temp 98.3°F | Ht 73.0 in | Wt 227.0 lb

## 2023-03-29 DIAGNOSIS — R5382 Chronic fatigue, unspecified: Secondary | ICD-10-CM

## 2023-03-29 NOTE — Progress Notes (Signed)
Subjective:    Patient ID: [name] [name], male    DOB: October 25, 1945, 77 y.o.   MRN: [id number]  HPI  77 year old male who presents to the office today with his wife with a complaint of fatigue.  Patient's wife reports that seems to be more fatigued than usual, sleeps a little bit later and goes to bed a little bit earlier.  Patient reports feeling "sluggish".  He denies fevers, chills, shortness of breath, wheezing, or chest pain.  He does have a history of chronic knee disease stage IV.  He was recently seen CPE in the last 10 days.  TSH, vitamin B12, and iron panel within normal limits.  His vitamin D level was checked in July by nephrology and this was normal.  There has been concern for sleep apnea for many years on the behalf of multiple providers including myself but patient has been reluctant to do a sleep study.  Review of Systems See HPI   Past Medical History:  Diagnosis Date   Anemia    Cataract    DIABETES MELLITUS, TYPE II 01/02/2007   HYPERLIPIDEMIA 06/18/2007   HYPERTENSION 01/02/2007   RENAL INSUFFICIENCY 02/05/2008   Stroke (HCC) 2019    Social History   Socioeconomic History   Marital status: Married    Spouse name: Not on file   Number of children: Not on file   Years of education: Not on file   Highest education level: Not on file  Occupational History   Not on file  Tobacco Use   Smoking status: Never   Smokeless tobacco: Never  Vaping Use   Vaping status: Never Used  Substance and Sexual Activity   Alcohol use: No   Drug use: No   Sexual activity: Not on file  Other Topics Concern   Not on file  Social History Narrative   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 Likes to go to football games.    Social Determinants of Health   Financial Resource Strain: Low Risk  (02/14/2022)   Overall Financial Resource Strain (CARDIA)     Difficulty of Paying Living Expenses: Not hard at all  Food Insecurity: No Food Insecurity (11/04/2022)   Hunger Vital Sign    Worried About Running Out of Food in the Last Year: Never true    Ran Out of Food in the Last Year: Never true  Transportation Needs: No Transportation Needs (08/17/2022)   PRAPARE - Administrator, Civil Service (Medical): No    Lack of Transportation (Non-Medical): No  Physical Activity: Inactive (08/17/2022)   Exercise Vital Sign    Days of Exercise per Week: 0 days    Minutes of Exercise per Session: 0 min  Stress: No Stress Concern Present (08/17/2022)   [employer] of Occupational Health - Occupational Stress Questionnaire    Feeling of Stress : Not at all  Social Connections: Socially Integrated (08/17/2022)   Social Connection and Isolation Panel [NHANES]    Frequency of Communication with Friends and Family: More than three times a week    Frequency of Social Gatherings with Friends and Family: More than three times a week    Attends Religious Services: More than 4 times per year    Active Member of [employer] or Organizations: Yes    Attends Banker Meetings:  More than 4 times per year    Marital Status: Married  Catering manager Violence: Not At Risk (08/17/2022)   Humiliation, Afraid, Rape, and Kick questionnaire    Fear of Current or Ex-Partner: No    Emotionally Abused: No    Physically Abused: No    Sexually Abused: No    Past Surgical History:  Procedure Laterality Date   LOOP RECORDER INSERTION N/A 07/11/2017   Procedure: LOOP RECORDER INSERTION;  Surgeon: Hillis Range, MD;  Location: [facility] INVASIVE CV LAB;  Service: Cardiovascular;  Laterality: N/A;   SHOULDER SURGERY     left   TEE WITHOUT CARDIOVERSION N/A 07/11/2017   Procedure: TRANSESOPHAGEAL ECHOCARDIOGRAM (TEE);  Surgeon: Lars Masson, MD;  Location: [facility] ENDOSCOPY;  Service: Cardiovascular;  Laterality: N/A;   TRANSURETHRAL RESECTION OF PROSTATE     history of  retention/hematuria     Family History  Problem Relation Age of Onset   Hypertension Mother    Diabetes Mother    Stroke Father    Stroke Maternal Grandmother    Colon cancer Neg Hx    Esophageal cancer Neg Hx    Rectal cancer Neg Hx    Stomach cancer Neg Hx     Allergies  Allergen Reactions   Evolocumab Hypertension    Other reaction(s): Other (see comments)   5-Alpha Reductase Inhibitors Other (See Comments)    myalgia Other reaction(s): Other (see comments)   Bactrim [Sulfamethoxazole-Trimethoprim] Hives, Itching and Swelling   Penicillins Hives    Has patient had a PCN reaction causing immediate rash, facial/tongue/throat swelling, SOB or lightheadedness with hypotension: Unk Has patient had a PCN reaction causing severe rash involving mucus membranes or skin necrosis: Unk Has patient had a PCN reaction that required hospitalization: Unk Has patient had a PCN reaction occurring within the last 10 years: No If all of the above answers are "NO", then may proceed with Cephalosporin use. Other reaction(s): Other (see comments)   Statins     myalgia Other reaction(s): Other (see comments)   Sulfa Antibiotics     Other reaction(s): Other (see comments)   Sulfa Drugs Cross Reactors Hives, Itching and Swelling   Sulfamethoxazole-Trimethoprim Hives and Itching    Other reaction(s): Other (see comments)    Current Outpatient Medications on File Prior to Visit  Medication Sig Dispense Refill   amLODipine (NORVASC) 5 MG tablet Take 5 mg by mouth in the morning and at bedtime.     aspirin EC 81 MG tablet Take 1 tablet (81 mg total) by mouth daily. Swallow whole. 90 tablet 3   B Complex Vitamins (VITAMIN B COMPLEX) TABS Take by mouth.     Blood Glucose Monitoring Suppl (ONETOUCH VERIO) w/Device KIT Use with existing test strips 1 kit kit   calcitRIOL (ROCALTROL) 0.25 MCG capsule Take 1 capsule by mouth every other day.     Cholecalciferol 50 MCG (2000 UT) CAPS Take 1 capsule by  mouth daily.     ezetimibe (ZETIA) 10 MG tablet TAKE 1 TABLET(10 MG) BY MOUTH DAILY 90 tablet 3   ferrous sulfate 325 (65 FE) MG tablet Take 325 mg by mouth 2 (two) times daily with a meal.     hydrALAZINE (APRESOLINE) 100 MG tablet Take 100 mg by mouth 3 (three) times daily.     labetalol (NORMODYNE) 200 MG tablet TAKE 1 TABLET(200 MG) BY MOUTH THREE TIMES DAILY 270 tablet 3   LANTUS SOLOSTAR 100 UNIT/ML Solostar Pen ADMINISTER 9 UNITS UNDER THE SKIN TWICE DAILY (  Patient taking differently: Takes 9 units in AM and 3 units at PM) 15 mL 0   latanoprost (XALATAN) 0.005 % ophthalmic solution 1 drop at bedtime.     nateglinide (STARLIX) 60 MG tablet TAKE 1 TABLET BY MOUTH ONCE A DAY WITH MEALS     ONETOUCH VERIO test strip USE TO TEST TWICE DAILY AS DIRECTED 200 strip 2   timolol (TIMOPTIC) 0.5 % ophthalmic solution 1 drop 2 (two) times daily.     valsartan (DIOVAN) 160 MG tablet Take 80 mg by mouth 2 (two) times daily.     No current facility-administered medications on file prior to visit.    BP (!) 140/80   Pulse 62   Temp 98.3 F (36.8 C) (Oral)   Ht 6\' 1"  (1.854 m)   Wt 227 lb (103 kg)   SpO2 98%   BMI 29.95 kg/m       Objective:   Physical Exam Vitals and nursing note reviewed.  Constitutional:      Appearance: Normal appearance.  Cardiovascular:     Rate and Rhythm: Normal rate and regular rhythm.     Pulses: Normal pulses.     Heart sounds: Normal heart sounds.  Pulmonary:     Effort: Pulmonary effort is normal.     Breath sounds: Normal breath sounds.  Skin:    General: Skin is warm and dry.  Neurological:     General: No focal deficit present.     Mental Status: He is alert and oriented to person, place, and time.  Psychiatric:        Mood and Affect: Mood normal.        Behavior: Behavior normal.        Thought Content: Thought content normal.        Judgment: Judgment normal.        Assessment & Plan:  1. Chronic fatigue -Discussed rechecking iron panel  and vitamin D but patient did want to do so and I do not disagree.  Advised himself and his wife that the chronic fatigue is likely due to chronic kidney disease but we cannot rule out sleep apnea without a sleep study.  Patient is willing to meet to discuss his sleep study with the pulmonologist at this time - Ambulatory referral to Pulmonology  Shirline Frees, NP

## 2023-03-29 NOTE — Telephone Encounter (Signed)
Pt notified of update and will keep the appt. For today. No further actions needed.

## 2023-04-13 ENCOUNTER — Institutional Professional Consult (permissible substitution): Payer: Medicare Other | Admitting: Adult Health

## 2023-04-19 NOTE — Progress Notes (Signed)
Cardiology Office Note:    Date:  04/26/2023   ID:  [name] [name], DOB 1946-04-25, MRN [id number]  PCP:  Shirline Frees, NP   [facility] Providers Cardiologist:  Siddhartha Hoback [place], MD     Referring MD: Shirline Frees, NP   Chief Complaint  Patient presents with   Shortness of Breath    History of Present Illness:    [name] [name] is a 77 y.o. male who is seen for follow up evaluation of dyspnea on exertion. He has a history of DM type 2, HTN, HLD and CKD. Prior CVA in 2019. Had ILR placed in 2019 with no arrhythmias noted to date. Echo and TEE in 2019 were normal. He has moderate carotid arterial disease followed by Vascular surgery in [place].  When seen in Dec  he complained of symptoms of SOB with exertion.  To evaluate further we performed a Myoview study and Echo with results noted below. He has mild LV dysfunction with EF 40-45% and no significant perfusion abnormality.  On follow up today he still gets SOB with walking. Weight has been stable and no edema. He is careful with his salt intake. BP is better. No chest pain.  He does complain of a lot of fatigue. Planning to do sleep study per pulmonary. He is quite sedentary.   Past Medical History:  Diagnosis Date   Anemia    Cataract    CKD (chronic kidney disease) stage 4, GFR 15-29 ml/min (HCC)    DIABETES MELLITUS, TYPE II 01/02/2007   HYPERLIPIDEMIA 06/18/2007   HYPERTENSION 01/02/2007   RENAL INSUFFICIENCY 02/05/2008   Stroke (HCC) 2019    Past Surgical History:  Procedure Laterality Date   LOOP RECORDER INSERTION N/A 07/11/2017   Procedure: LOOP RECORDER INSERTION;  Surgeon: Hillis Range, MD;  Location: [facility] INVASIVE CV LAB;  Service: Cardiovascular;  Laterality: N/A;   SHOULDER SURGERY     left   TEE WITHOUT CARDIOVERSION N/A 07/11/2017   Procedure: TRANSESOPHAGEAL ECHOCARDIOGRAM (TEE);  Surgeon: Lars Masson, MD;  Location: [facility] ENDOSCOPY;  Service: Cardiovascular;  Laterality: N/A;    TRANSURETHRAL RESECTION OF PROSTATE     history of retention/hematuria     Current Medications: Current Meds  Medication Sig   amLODipine (NORVASC) 5 MG tablet Take 5 mg by mouth in the morning and at bedtime.   aspirin EC 81 MG tablet Take 1 tablet (81 mg total) by mouth daily. Swallow whole.   B Complex Vitamins (VITAMIN B COMPLEX) TABS Take by mouth.   Blood Glucose Monitoring Suppl (ONETOUCH VERIO) w/Device KIT Use with existing test strips   calcitRIOL (ROCALTROL) 0.25 MCG capsule Take 1 capsule by mouth every other day.   Cholecalciferol 50 MCG (2000 UT) CAPS Take 1 capsule by mouth daily.   ezetimibe (ZETIA) 10 MG tablet TAKE 1 TABLET(10 MG) BY MOUTH DAILY   ferrous sulfate 325 (65 FE) MG tablet Take 325 mg by mouth 2 (two) times daily with a meal.   hydrALAZINE (APRESOLINE) 100 MG tablet Take 100 mg by mouth 3 (three) times daily.   labetalol (NORMODYNE) 200 MG tablet TAKE 1 TABLET(200 MG) BY MOUTH THREE TIMES DAILY   LANTUS SOLOSTAR 100 UNIT/ML Solostar Pen ADMINISTER 9 UNITS UNDER THE SKIN TWICE DAILY (Patient taking differently: Takes 9 units in AM and 3 units at PM)   latanoprost (XALATAN) 0.005 % ophthalmic solution 1 drop at bedtime.   nateglinide (STARLIX) 60 MG tablet TAKE 1 TABLET BY MOUTH ONCE A DAY  WITH MEALS   ONETOUCH VERIO test strip USE TO TEST TWICE DAILY AS DIRECTED   timolol (TIMOPTIC) 0.5 % ophthalmic solution 1 drop 2 (two) times daily.   valsartan (DIOVAN) 160 MG tablet Take 80 mg by mouth 2 (two) times daily.     Allergies:   Evolocumab, 5-alpha reductase inhibitors, Bactrim [sulfamethoxazole-trimethoprim], Penicillins, Statins, Sulfa antibiotics, Sulfa drugs cross reactors, and Sulfamethoxazole-trimethoprim   Social History   Socioeconomic History   Marital status: Married    Spouse name: Not on file   Number of children: Not on file   Years of education: Not on file   Highest education level: Not on file  Occupational History   Not on file  Tobacco  Use   Smoking status: Never   Smokeless tobacco: Never  Vaping Use   Vaping status: Never Used  Substance and Sexual Activity   Alcohol use: No   Drug use: No   Sexual activity: Not on file  Other Topics Concern   Not on file  Social History Narrative   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 Likes to go to football games.    Social Determinants of Health   Financial Resource Strain: Low Risk  (02/14/2022)   Overall Financial Resource Strain (CARDIA)    Difficulty of Paying Living Expenses: Not hard at all  Food Insecurity: No Food Insecurity (11/04/2022)   Hunger Vital Sign    Worried About Running Out of Food in the Last Year: Never true    Ran Out of Food in the Last Year: Never true  Transportation Needs: No Transportation Needs (08/17/2022)   PRAPARE - Administrator, Civil Service (Medical): No    Lack of Transportation (Non-Medical): No  Physical Activity: Inactive (08/17/2022)   Exercise Vital Sign    Days of Exercise per Week: 0 days    Minutes of Exercise per Session: 0 min  Stress: No Stress Concern Present (08/17/2022)   [employer] of Occupational Health - Occupational Stress Questionnaire    Feeling of Stress : Not at all  Social Connections: Socially Integrated (08/17/2022)   Social Connection and Isolation Panel [NHANES]    Frequency of Communication with Friends and Family: More than three times a week    Frequency of Social Gatherings with Friends and Family: More than three times a week    Attends Religious Services: More than 4 times per year    Active Member of [employer] or Organizations: Yes    Attends Engineer, structural: More than 4 times per year    Marital Status: Married     Family History: The patient's family history includes Diabetes in his mother; Hypertension in his mother; Stroke in his father and maternal  grandmother. There is no history of Colon cancer, Esophageal cancer, Rectal cancer, or Stomach cancer.  ROS:   Please see the history of present illness.     All other systems reviewed and are negative.  EKGs/Labs/Other Studies Reviewed:    The following studies were reviewed today: Echo 07/09/17: Study Conclusions   - Left ventricle: The cavity size was mildly dilated. Wall    thickness was increased in a pattern of mild LVH. Systolic    function was normal. The estimated ejection fraction was in the    range of 55% to 60%. Wall motion  was normal; there were no    regional wall motion abnormalities. Doppler parameters are    consistent with abnormal left ventricular relaxation (grade 1    diastolic dysfunction).  - Left atrium: The atrium was mildly dilated.   TEE 07/11/17: Study Conclusions   - Left ventricle: There was mild concentric hypertrophy. Systolic    function was normal. The estimated ejection fraction was in the    range of 55% to 60%. Wall motion was normal; there were no    regional wall motion abnormalities.  - Aorta: There was moderate non-mobile atheroma.  - Left atrium: The atrium was dilated. No evidence of thrombus in    the atrial cavity or appendage. No evidence of thrombus in the    atrial cavity or appendage. The appendage was morphologically a    left appendage, multilobulated, and of normal size. Emptying    velocity was normal.  - Right atrium: No evidence of thrombus in the atrial cavity or    appendage.  - Atrial septum: No defect or patent foramen ovale was identified.  - Tricuspid valve: There was mild regurgitation.   Impressions:   - No cardiac source of emboli was indentified.   Echo 07/05/22: IMPRESSIONS     1. Left ventricular ejection fraction, by estimation, is 45 to 50%. Left  ventricular ejection fraction by 3D volume is 45 %. The left ventricle has  mildly decreased function. The left ventricle demonstrates global  hypokinesis. The  left ventricular  internal cavity size was mildly dilated. Left ventricular diastolic  parameters are consistent with Grade II diastolic dysfunction  (pseudonormalization). Elevated left ventricular end-diastolic pressure.   2. Right ventricular systolic function is normal. The right ventricular  size is normal.   3. Left atrial size was severely dilated.   4. The mitral valve is normal in structure. Trivial mitral valve  regurgitation. No evidence of mitral stenosis.   5. The aortic valve is tricuspid. There is moderate calcification of the  aortic valve. There is moderate thickening of the aortic valve. Aortic  valve regurgitation is not visualized. Aortic valve  sclerosis/calcification is present, without any evidence  of aortic stenosis.   6. Aortic dilatation noted. There is mild dilatation of the ascending  aorta, measuring 38 mm.   7. The inferior vena cava is dilated in size with >50% respiratory  variability, suggesting right atrial pressure of 8 mmHg.   Myoview 06/09/22: Study Highlights      Findings are consistent with no prior ischemia. The study is intermediate risk.   No ST deviation was noted.   LV perfusion is abnormal. There is no evidence of ischemia. There is no evidence of infarction. Defect 1: There is a small defect with mild reduction in uptake present in the apical apex location(s) that is fixed. There is normal wall motion in the defect area. Consistent with artifact.   Left ventricular function is abnormal. Global function is mildly reduced. End diastolic cavity size is moderately enlarged. End systolic cavity size is moderately enlarged.   Prior study not available for comparison.   Small size, mild intensity fixed apical perfusion defect, c/w apical thinning artifact. No reversible ischemia. LVEF 47% with moderately dilated LV and global hypokinesis. This is an intermediate risk study. No prior study for comparison.      EKG:  EKG is not ordered today.    CHEST - 2 VIEW   COMPARISON:  January 25, 2019   FINDINGS: The heart size and mediastinal contours  are stable. Both lungs are clear. The visualized skeletal structures are stable.   IMPRESSION: No active cardiopulmonary disease.     Electronically Signed   By: Sherian Rein M.D.   On: 06/04/2022 12:30     Recent Labs: 03/21/2023: ALT 10; BUN 42; Creatinine, Ser 3.12; Hemoglobin 11.7; Platelets 264.0; Potassium 4.5; Sodium 140; TSH 1.98  Recent Lipid Panel    Component Value Date/Time   CHOL 170 03/21/2023 0835   TRIG 160.0 (H) 03/21/2023 0835   TRIG 323 (HH) 06/13/2006 1050   HDL 39.90 03/21/2023 0835   CHOLHDL 4 03/21/2023 0835   VLDL 32.0 03/21/2023 0835   LDLCALC 98 03/21/2023 0835   LDLCALC 98 01/15/2020 0811   LDLDIRECT 180.7 06/24/2013 1106   Labs dated 06/14/22: BUN 40, creatinine 2.98. GFR 22. Otherwise CMET normal. PTH 177. Phosphorus 3.3. UA normal.   Physical Exam:    VS:  BP 136/84   Pulse 61   Ht 6\' 3"  (1.905 m)   Wt 230 lb 3.2 oz (104.4 kg)   SpO2 97%   BMI 28.77 kg/m     Wt Readings from Last 3 Encounters:  04/26/23 230 lb 3.2 oz (104.4 kg)  04/20/23 228 lb 12.8 oz (103.8 kg)  03/29/23 227 lb (103 kg)     GEN:  Well nourished, well developed in no acute distress HEENT: Normal NECK: No JVD; No carotid bruits LYMPHATICS: No lymphadenopathy CARDIAC: RRR, no murmurs, rubs, gallops RESPIRATORY:  Clear to auscultation without rales, wheezing or rhonchi  ABDOMEN: Soft, non-tender, non-distended MUSCULOSKELETAL:  No edema; No deformity  SKIN: Warm and dry NEUROLOGIC:  Alert and oriented x 3 PSYCHIATRIC:  Normal affect   ASSESSMENT:    1. DOE (dyspnea on exertion)   2. Primary hypertension   3. Controlled type 2 diabetes mellitus without complication, without long-term current use of insulin (HCC)   4. Type 2 diabetes mellitus with complication, with long-term current use of insulin (HCC)      PLAN:    In order of problems listed  above:  Chronic combined systolic/diastolic CHF.  He does have mild LV dysfunction given longstanding DM and HTN.  No significant perfusion abnormality on Myoview. He is not volume overloaded today. His DOE is multifactorial  Would try and avoid contrast studies given CKD.  Encourage regular aerobic exercise.  Not a candidate for aldactone or SGLT 2 inhibitor with his renal function. Agree with sleep study HTN control is OK  today on multiple medications.  DM type 2 on insulin. Last A1c 6.9% HLD - on Zetia. Intolerant to multiple meds Carotid arterial disease followed by vascular surgery in [place] CKD stage 4- followed by Nephrology History of prior CVA 4 years ago- recommend reducing ASA to 81 mg daily.        Follow up in 6 months with APP  Medication Adjustments/Labs and Tests Ordered: Current medicines are reviewed at length with the patient today.  Concerns regarding medicines are outlined above.  No orders of the defined types were placed in this encounter.  No orders of the defined types were placed in this encounter.   There are no Patient Instructions on file for this visit.   Signed, Loriana Samad [place], MD  04/26/2023 8:15 AM    [facility]

## 2023-04-20 ENCOUNTER — Ambulatory Visit: Payer: Medicare Other | Admitting: Adult Health

## 2023-04-20 ENCOUNTER — Telehealth: Payer: Self-pay | Admitting: *Deleted

## 2023-04-20 ENCOUNTER — Encounter: Payer: Self-pay | Admitting: Adult Health

## 2023-04-20 VITALS — BP 140/80 | HR 64 | Ht 75.0 in | Wt 228.8 lb

## 2023-04-20 DIAGNOSIS — I639 Cerebral infarction, unspecified: Secondary | ICD-10-CM | POA: Diagnosis not present

## 2023-04-20 DIAGNOSIS — I509 Heart failure, unspecified: Secondary | ICD-10-CM

## 2023-04-20 DIAGNOSIS — R400 Somnolence: Secondary | ICD-10-CM | POA: Diagnosis not present

## 2023-04-20 DIAGNOSIS — I10 Essential (primary) hypertension: Secondary | ICD-10-CM

## 2023-04-20 NOTE — Patient Instructions (Signed)
Set up for Split Night Sleep study  Healthy sleep regimen -try to decrease naps/daytime sleeping.  Do not drive if sleepy  Follow up in 6 weeks and As needed

## 2023-04-20 NOTE — Progress Notes (Signed)
@Patient  ID: [name] [name], male    DOB: 01/01/1946, 77 y.o.   MRN: [id number]  Chief Complaint  Patient presents with   Consult    Referring provider: Shirline Frees, NP  HPI: 77 year old male seen for sleep consult April 20, 2023 for restless sleep and daytime sleepiness and fatigue.  TEST/EVENTS :   04/20/2023 Sleep consult  Patient presents for sleep consult today.  Kindly referred by primary care provider Shirline Frees, NP .  Patient complains of very restless sleep, low energy, daytime sleepiness.  Feels tired all the time.  Has no energy.  Feels like he can sleep all day.  No matter how much sleep he gets he feels tired.  Epworth score is 8 out of 24.  Typically goes to bed about 11 PM.  Takes up to 30 minutes to go to sleep.  Is up several times throughout the night.  Gets up about 7 AM.  Typically is up for couple hours and goes back to sleep for 2 hours and then takes a nap later in the afternoon for about 2 hours.  Sometimes will fall asleep watching TV.  Patient has severe hypertension and chronic kidney disease.  Is on multiple antihypertensive medications.  Patient has a history of stroke with balance and speech issues.  Typically drinks about 2 cups of caffeine daily.  Has a history of congestive heart failure.  No symptoms suspicious for cataplexy or sleep paralysis.  Does not take any sleep aids.  Social history patient is married.  He is retired from the [employer].  He is a never smoker.  No alcohol or drug use.  Has adult children.  Patient is from [place].  Family history positive for heart disease.  Surgical history left shoulder surgery  Allergies  Allergen Reactions   Evolocumab Hypertension    Other reaction(s): Other (see comments)   5-Alpha Reductase Inhibitors Other (See Comments)    myalgia Other reaction(s): Other (see comments)   Bactrim [Sulfamethoxazole-Trimethoprim] Hives, Itching and Swelling   Penicillins Hives    Has patient had  a PCN reaction causing immediate rash, facial/tongue/throat swelling, SOB or lightheadedness with hypotension: Unk Has patient had a PCN reaction causing severe rash involving mucus membranes or skin necrosis: Unk Has patient had a PCN reaction that required hospitalization: Unk Has patient had a PCN reaction occurring within the last 10 years: No If all of the above answers are "NO", then may proceed with Cephalosporin use. Other reaction(s): Other (see comments)   Statins     myalgia Other reaction(s): Other (see comments)   Sulfa Antibiotics     Other reaction(s): Other (see comments)   Sulfa Drugs Cross Reactors Hives, Itching and Swelling   Sulfamethoxazole-Trimethoprim Hives and Itching    Other reaction(s): Other (see comments)    Immunization History  Administered Date(s) Administered   Influenza, High Dose Seasonal PF 06/19/2018   PFIZER(Purple Top)SARS-COV-2 Vaccination 01/18/2020, 02/08/2020, 05/21/2021   Pfizer(Comirnaty)Fall Seasonal Vaccine 12 years and older 11/18/2022   Pneumococcal Conjugate-13 01/03/2019   Pneumococcal Polysaccharide-23 12/20/2011   Td 11/13/2009    Past Medical History:  Diagnosis Date   Anemia    Cataract    CKD (chronic kidney disease) stage 4, GFR 15-29 ml/min (HCC)    DIABETES MELLITUS, TYPE II 01/02/2007   HYPERLIPIDEMIA 06/18/2007   HYPERTENSION 01/02/2007   RENAL INSUFFICIENCY 02/05/2008   Stroke (HCC) 2019    Tobacco History: Social History   Tobacco Use  Smoking Status Never  Smokeless Tobacco Never   Counseling given: Not Answered   Outpatient Medications Prior to Visit  Medication Sig Dispense Refill   amLODipine (NORVASC) 5 MG tablet Take 5 mg by mouth in the morning and at bedtime.     aspirin EC 81 MG tablet Take 1 tablet (81 mg total) by mouth daily. Swallow whole. 90 tablet 3   B Complex Vitamins (VITAMIN B COMPLEX) TABS Take by mouth.     Blood Glucose Monitoring Suppl (ONETOUCH VERIO) w/Device KIT Use with  existing test strips 1 kit kit   calcitRIOL (ROCALTROL) 0.25 MCG capsule Take 1 capsule by mouth every other day.     Cholecalciferol 50 MCG (2000 UT) CAPS Take 1 capsule by mouth daily.     ezetimibe (ZETIA) 10 MG tablet TAKE 1 TABLET(10 MG) BY MOUTH DAILY 90 tablet 3   ferrous sulfate 325 (65 FE) MG tablet Take 325 mg by mouth 2 (two) times daily with a meal.     hydrALAZINE (APRESOLINE) 100 MG tablet Take 100 mg by mouth 3 (three) times daily.     labetalol (NORMODYNE) 200 MG tablet TAKE 1 TABLET(200 MG) BY MOUTH THREE TIMES DAILY 270 tablet 3   LANTUS SOLOSTAR 100 UNIT/ML Solostar Pen ADMINISTER 9 UNITS UNDER THE SKIN TWICE DAILY (Patient taking differently: Takes 9 units in AM and 3 units at PM) 15 mL 0   latanoprost (XALATAN) 0.005 % ophthalmic solution 1 drop at bedtime.     nateglinide (STARLIX) 60 MG tablet TAKE 1 TABLET BY MOUTH ONCE A DAY WITH MEALS     ONETOUCH VERIO test strip USE TO TEST TWICE DAILY AS DIRECTED 200 strip 2   timolol (TIMOPTIC) 0.5 % ophthalmic solution 1 drop 2 (two) times daily.     valsartan (DIOVAN) 160 MG tablet Take 80 mg by mouth 2 (two) times daily.     No facility-administered medications prior to visit.     Review of Systems:   Constitutional:   No  weight loss, night sweats,  Fevers, chills, +fatigue, or  lassitude.  HEENT:   No headaches,  Difficulty swallowing,  Tooth/dental problems, or  Sore throat,                No sneezing, itching, ear ache, nasal congestion, post nasal drip,   CV:  No chest pain,  Orthopnea, PND, , anasarca, dizziness, palpitations, syncope.   GI  No heartburn, indigestion, abdominal pain, nausea, vomiting, diarrhea, change in bowel habits, loss of appetite, bloody stools.   Resp: No shortness of breath with exertion or at rest.  No excess mucus, no productive cough,  No non-productive cough,  No coughing up of blood.  No change in color of mucus.  No wheezing.  No chest wall deformity  Skin: no rash or lesions.  GU:  no dysuria, change in color of urine, no urgency or frequency.  No flank pain, no hematuria   MS:  No joint pain or swelling.  No decreased range of motion.  No back pain.    Physical Exam  BP (!) 140/80 (BP Location: Left Arm, Patient Position: Sitting, Cuff Size: Normal) Comment: Just took BP meds  Pulse 64   Ht 6\' 3"  (1.905 m)   Wt 228 lb 12.8 oz (103.8 kg)   SpO2 100%   BMI 28.60 kg/m   GEN: A/Ox3; pleasant , NAD, well nourished    HEENT:  [place]/AT,   NOSE-clear, THROAT-clear, no lesions, no postnasal drip or exudate noted. Class 3  MP airway, poor dentition    NECK:  Supple w/ fair ROM; no JVD; normal carotid impulses w/o bruits; no thyromegaly or nodules palpated; no lymphadenopathy.    RESP  Clear  P & A; w/o, wheezes/ rales/ or rhonchi. no accessory muscle use, no dullness to percussion  CARD:  RRR, no m/r/g, no peripheral edema, pulses intact, no cyanosis or clubbing.  GI:   Soft & nt; nml bowel sounds; no organomegaly or masses detected.   Musco: Warm bil, no deformities or joint swelling noted.   Neuro: alert, no focal deficits noted.    Skin: Warm, no lesions or rashes    Lab Results:  CBC    Component Value Date/Time   WBC 7.6 03/21/2023 0835   RBC 4.31 03/21/2023 0835   HGB 11.7 (L) 03/21/2023 0835   HGB 11.1 (L) 02/06/2020 0838   HCT 36.4 (L) 03/21/2023 0835   PLT 264.0 03/21/2023 0835   PLT 238 02/06/2020 0838   MCV 84.4 03/21/2023 0835   MCH 27.4 08/07/2020 0853   MCHC 32.2 03/21/2023 0835   RDW 14.9 03/21/2023 0835   LYMPHSABS 2.3 06/03/2022 0822   MONOABS 0.7 06/03/2022 0822   EOSABS 0.7 06/03/2022 0822   BASOSABS 0.1 06/03/2022 0822    BMET    Component Value Date/Time   NA 140 03/21/2023 0835   NA 137 09/04/2019 0000   K 4.5 03/21/2023 0835   CL 110 03/21/2023 0835   CO2 23 03/21/2023 0835   GLUCOSE 152 (H) 03/21/2023 0835   GLUCOSE 251 (H) 06/13/2006 1050   BUN 42 (H) 03/21/2023 0835   BUN 37 (A) 09/04/2019 0000   CREATININE  3.12 (H) 03/21/2023 0835   CREATININE 2.80 (H) 08/07/2020 0853   CREATININE 2.52 (H) 01/15/2020 0811   CALCIUM 9.3 03/21/2023 0835   GFRNONAA 23 (L) 08/07/2020 0853   GFRAA 30 05/24/2019 0000   GFRAA 25 (L) 04/03/2019 1106    BNP No results found for: "BNP"  ProBNP No results found for: "PROBNP"  Imaging: No results found.  Administration History     None           No data to display          No results found for: "NITRICOXIDE"      Assessment & Plan:   Daytime sleepiness Daytime sleepiness, restless sleep in the setting of multiple comorbidities with diabetes, severe hypertension, chronic kidney disease, previous stroke and congestive heart failure.  Will set patient up for a split-night sleep study.  Discussed with patient regarding decrease in his number of naps during the daytime. - discussed how weight can impact sleep and risk for sleep disordered breathing - discussed options to assist with weight loss: combination of diet modification, cardiovascular and strength training exercises   - had an extensive discussion regarding the adverse health consequences related to untreated sleep disordered breathing - specifically discussed the risks for hypertension, coronary artery disease, cardiac dysrhythmias, cerebrovascular disease, and diabetes - lifestyle modification discussed   - discussed how sleep disruption can increase risk of accidents, particularly when driving - safe driving practices were discussed   Plan  Patient Instructions  Set up for Split Night Sleep study  Healthy sleep regimen -try to decrease naps/daytime sleeping.  Do not drive if sleepy  Follow up in 6 weeks and As needed       Uncontrolled hypertension Continue follow-up with primary care and cardiology     Rubye Oaks, NP 04/20/2023

## 2023-04-20 NOTE — Telephone Encounter (Signed)
Spoke with patient/spouse regarding f/u after split night sleep study.  Advised to call to schedule a 1-2 week out follow up after the study is completed to give the sleep doctor time to read the study.  They verified understanding.  Per [facility]), they are 12 weeks out getting prior auths for split night sleep studies.

## 2023-04-20 NOTE — Assessment & Plan Note (Signed)
Daytime sleepiness, restless sleep in the setting of multiple comorbidities with diabetes, severe hypertension, chronic kidney disease, previous stroke and congestive heart failure.  Will set patient up for a split-night sleep study.  Discussed with patient regarding decrease in his number of naps during the daytime. - discussed how weight can impact sleep and risk for sleep disordered breathing - discussed options to assist with weight loss: combination of diet modification, cardiovascular and strength training exercises   - had an extensive discussion regarding the adverse health consequences related to untreated sleep disordered breathing - specifically discussed the risks for hypertension, coronary artery disease, cardiac dysrhythmias, cerebrovascular disease, and diabetes - lifestyle modification discussed   - discussed how sleep disruption can increase risk of accidents, particularly when driving - safe driving practices were discussed   Plan  Patient Instructions  Set up for Split Night Sleep study  Healthy sleep regimen -try to decrease naps/daytime sleeping.  Do not drive if sleepy  Follow up in 6 weeks and As needed

## 2023-04-20 NOTE — Assessment & Plan Note (Signed)
Continue follow-up with primary care and cardiology

## 2023-04-26 ENCOUNTER — Ambulatory Visit: Payer: Medicare Other | Attending: Cardiology | Admitting: Cardiology

## 2023-04-26 ENCOUNTER — Encounter: Payer: Self-pay | Admitting: Cardiology

## 2023-04-26 VITALS — BP 136/84 | HR 61 | Ht 75.0 in | Wt 230.2 lb

## 2023-04-26 DIAGNOSIS — I10 Essential (primary) hypertension: Secondary | ICD-10-CM

## 2023-04-26 DIAGNOSIS — R0609 Other forms of dyspnea: Secondary | ICD-10-CM

## 2023-04-26 DIAGNOSIS — I5042 Chronic combined systolic (congestive) and diastolic (congestive) heart failure: Secondary | ICD-10-CM

## 2023-04-26 DIAGNOSIS — Z794 Long term (current) use of insulin: Secondary | ICD-10-CM | POA: Diagnosis not present

## 2023-04-26 DIAGNOSIS — E118 Type 2 diabetes mellitus with unspecified complications: Secondary | ICD-10-CM

## 2023-04-26 DIAGNOSIS — E119 Type 2 diabetes mellitus without complications: Secondary | ICD-10-CM | POA: Diagnosis not present

## 2023-04-26 NOTE — Patient Instructions (Addendum)
Medication Instructions:  None Continue to take all current medications *If you need a refill on your cardiac medications before your next appointment, please call your pharmacy*   Lab Work: none  Testing/Procedures: none   Follow-Up: At [facility], you and your health needs are our priority.  As part of our continuing mission to provide you with exceptional heart care, we have created designated Provider Care Teams.  These Care Teams include your primary Cardiologist (physician) and Advanced Practice Providers (APPs -  Physician Assistants and Nurse Practitioners) who all work together to provide you with the care you need, when you need it.   Your next appointment:   6 months . Please call office in Jan 2025 to schedule appt for April 2025  Provider:   [name], MD     Other Instructions Please exercise at least 5 days a week

## 2023-05-08 DIAGNOSIS — Z961 Presence of intraocular lens: Secondary | ICD-10-CM | POA: Diagnosis not present

## 2023-05-08 DIAGNOSIS — H02132 Senile ectropion of right lower eyelid: Secondary | ICD-10-CM | POA: Diagnosis not present

## 2023-05-08 DIAGNOSIS — H0102A Squamous blepharitis right eye, upper and lower eyelids: Secondary | ICD-10-CM | POA: Diagnosis not present

## 2023-05-08 DIAGNOSIS — H1045 Other chronic allergic conjunctivitis: Secondary | ICD-10-CM | POA: Diagnosis not present

## 2023-05-08 DIAGNOSIS — H0102B Squamous blepharitis left eye, upper and lower eyelids: Secondary | ICD-10-CM | POA: Diagnosis not present

## 2023-05-08 DIAGNOSIS — H34212 Partial retinal artery occlusion, left eye: Secondary | ICD-10-CM | POA: Diagnosis not present

## 2023-05-08 DIAGNOSIS — H35033 Hypertensive retinopathy, bilateral: Secondary | ICD-10-CM | POA: Diagnosis not present

## 2023-05-08 DIAGNOSIS — H353131 Nonexudative age-related macular degeneration, bilateral, early dry stage: Secondary | ICD-10-CM | POA: Diagnosis not present

## 2023-05-08 DIAGNOSIS — H401122 Primary open-angle glaucoma, left eye, moderate stage: Secondary | ICD-10-CM | POA: Diagnosis not present

## 2023-05-08 DIAGNOSIS — H40021 Open angle with borderline findings, high risk, right eye: Secondary | ICD-10-CM | POA: Diagnosis not present

## 2023-05-12 ENCOUNTER — Telehealth: Payer: Self-pay | Admitting: Adult Health

## 2023-05-12 NOTE — Telephone Encounter (Signed)
Patient's wife is calling about a sleep study for her husband. I advised her per last encounter that it is taking up to 12 weeks to get prior auths for sleep studies and that someone will reach out closer to that time to get her husband scheduled. They can be reached at [phone].

## 2023-05-15 ENCOUNTER — Other Ambulatory Visit: Payer: Self-pay | Admitting: Adult Health

## 2023-06-06 DIAGNOSIS — R809 Proteinuria, unspecified: Secondary | ICD-10-CM | POA: Diagnosis not present

## 2023-06-06 DIAGNOSIS — E559 Vitamin D deficiency, unspecified: Secondary | ICD-10-CM | POA: Diagnosis not present

## 2023-06-06 DIAGNOSIS — R309 Painful micturition, unspecified: Secondary | ICD-10-CM | POA: Diagnosis not present

## 2023-06-06 DIAGNOSIS — E211 Secondary hyperparathyroidism, not elsewhere classified: Secondary | ICD-10-CM | POA: Diagnosis not present

## 2023-06-06 DIAGNOSIS — M1000 Idiopathic gout, unspecified site: Secondary | ICD-10-CM | POA: Diagnosis not present

## 2023-06-06 DIAGNOSIS — I10 Essential (primary) hypertension: Secondary | ICD-10-CM | POA: Diagnosis not present

## 2023-06-06 DIAGNOSIS — E119 Type 2 diabetes mellitus without complications: Secondary | ICD-10-CM | POA: Diagnosis not present

## 2023-06-06 DIAGNOSIS — E1169 Type 2 diabetes mellitus with other specified complication: Secondary | ICD-10-CM | POA: Diagnosis not present

## 2023-06-06 DIAGNOSIS — N184 Chronic kidney disease, stage 4 (severe): Secondary | ICD-10-CM | POA: Diagnosis not present

## 2023-06-06 DIAGNOSIS — D649 Anemia, unspecified: Secondary | ICD-10-CM | POA: Diagnosis not present

## 2023-06-08 ENCOUNTER — Institutional Professional Consult (permissible substitution): Payer: Medicare Other | Admitting: Adult Health

## 2023-06-13 ENCOUNTER — Encounter (INDEPENDENT_AMBULATORY_CARE_PROVIDER_SITE_OTHER): Payer: Medicare Other | Admitting: Ophthalmology

## 2023-06-13 DIAGNOSIS — H35033 Hypertensive retinopathy, bilateral: Secondary | ICD-10-CM | POA: Diagnosis not present

## 2023-06-13 DIAGNOSIS — Z794 Long term (current) use of insulin: Secondary | ICD-10-CM | POA: Diagnosis not present

## 2023-06-13 DIAGNOSIS — H43813 Vitreous degeneration, bilateral: Secondary | ICD-10-CM | POA: Diagnosis not present

## 2023-06-13 DIAGNOSIS — I10 Essential (primary) hypertension: Secondary | ICD-10-CM | POA: Diagnosis not present

## 2023-06-13 DIAGNOSIS — H34213 Partial retinal artery occlusion, bilateral: Secondary | ICD-10-CM | POA: Diagnosis not present

## 2023-06-13 DIAGNOSIS — E113393 Type 2 diabetes mellitus with moderate nonproliferative diabetic retinopathy without macular edema, bilateral: Secondary | ICD-10-CM

## 2023-06-13 DIAGNOSIS — H33301 Unspecified retinal break, right eye: Secondary | ICD-10-CM | POA: Diagnosis not present

## 2023-06-13 DIAGNOSIS — H34232 Retinal artery branch occlusion, left eye: Secondary | ICD-10-CM | POA: Diagnosis not present

## 2023-06-16 ENCOUNTER — Ambulatory Visit (HOSPITAL_BASED_OUTPATIENT_CLINIC_OR_DEPARTMENT_OTHER): Payer: Medicare Other | Attending: Adult Health | Admitting: Pulmonary Disease

## 2023-06-16 DIAGNOSIS — R400 Somnolence: Secondary | ICD-10-CM

## 2023-06-16 DIAGNOSIS — I509 Heart failure, unspecified: Secondary | ICD-10-CM

## 2023-06-16 DIAGNOSIS — I639 Cerebral infarction, unspecified: Secondary | ICD-10-CM

## 2023-06-16 DIAGNOSIS — G4734 Idiopathic sleep related nonobstructive alveolar hypoventilation: Secondary | ICD-10-CM | POA: Insufficient documentation

## 2023-06-20 ENCOUNTER — Ambulatory Visit (INDEPENDENT_AMBULATORY_CARE_PROVIDER_SITE_OTHER): Payer: Medicare Other | Admitting: Adult Health

## 2023-06-20 ENCOUNTER — Encounter: Payer: Self-pay | Admitting: Adult Health

## 2023-06-20 VITALS — BP 160/100 | HR 69 | Temp 98.3°F | Ht 74.0 in | Wt 230.0 lb

## 2023-06-20 DIAGNOSIS — I129 Hypertensive chronic kidney disease with stage 1 through stage 4 chronic kidney disease, or unspecified chronic kidney disease: Secondary | ICD-10-CM | POA: Diagnosis not present

## 2023-06-20 DIAGNOSIS — E119 Type 2 diabetes mellitus without complications: Secondary | ICD-10-CM | POA: Diagnosis not present

## 2023-06-20 DIAGNOSIS — Z794 Long term (current) use of insulin: Secondary | ICD-10-CM | POA: Diagnosis not present

## 2023-06-20 DIAGNOSIS — Z7984 Long term (current) use of oral hypoglycemic drugs: Secondary | ICD-10-CM

## 2023-06-20 LAB — COMPREHENSIVE METABOLIC PANEL
ALT: 11 U/L (ref 0–53)
AST: 14 U/L (ref 0–37)
Albumin: 4.3 g/dL (ref 3.5–5.2)
Alkaline Phosphatase: 57 U/L (ref 39–117)
BUN: 36 mg/dL — ABNORMAL HIGH (ref 6–23)
CO2: 25 meq/L (ref 19–32)
Calcium: 9.4 mg/dL (ref 8.4–10.5)
Chloride: 106 meq/L (ref 96–112)
Creatinine, Ser: 3.01 mg/dL — ABNORMAL HIGH (ref 0.40–1.50)
GFR: 19.48 mL/min — ABNORMAL LOW (ref >60.00)
Glucose, Bld: 118 mg/dL — ABNORMAL HIGH (ref 70–99)
Potassium: 3.8 meq/L (ref 3.5–5.1)
Sodium: 140 meq/L (ref 135–145)
Total Bilirubin: 0.5 mg/dL (ref 0.2–1.2)
Total Protein: 7.3 g/dL (ref 6.0–8.3)

## 2023-06-20 LAB — POCT GLYCOSYLATED HEMOGLOBIN (HGB A1C): Hemoglobin A1C: 7.1 % — AB (ref 4.0–5.6)

## 2023-06-20 NOTE — Progress Notes (Signed)
Subjective:    Patient ID: [name] [name], male    DOB: [birth year], 77 y.o.   MRN: [id number]  HPI  77 year old male who  has a past medical history of Anemia, Cataract, CKD (chronic kidney disease) stage 4, GFR 15-29 ml/min (HCC), DIABETES MELLITUS, TYPE II (01/02/2007), HYPERLIPIDEMIA (06/18/2007), HYPERTENSION (01/02/2007), RENAL INSUFFICIENCY (02/05/2008), and Stroke (HCC) (2019).  He presents to the office today for follow up regarding DM and HTN  Diabetes mellitus type 2-managed with Starlix 60 mg daily and Lantus 9 units in the morning and 3 units in the evening ( His nephrologist had him stop his evening dose due to hypoglycemia, since coming off his evening dose his blood sugars have been below 130.  Lab Results  Component Value Date   HGBA1C 6.9 (H) 03/21/2023   HGBA1C 6.7 (A) 11/03/2022   HGBA1C 7.1 (A) 08/11/2022   Hypertension with chronic kidney disease stage IV-managed by nephrology.  He is currently prescribed hydralazine 50 mg 3 times daily, Norvasc 5 mg daily, valsartan 160 mg daily, and labetalol 200 mg 3 times daily.  He does check his blood pressure at home with readings and they report readings in the 170's in the mornings and 120's in the evening.  He was last seen by Nephrology in  December 2024 at this time his Cr was 3.17 and GFR 20. He would like to recheck this today. He did not take his medication this morning   BP Readings from Last 3 Encounters:  06/20/23 (!) 160/100  04/26/23 136/84  04/20/23 (!) 140/80   Review of Systems See HPI   Past Medical History:  Diagnosis Date   Anemia    Cataract    CKD (chronic kidney disease) stage 4, GFR 15-29 ml/min (HCC)    DIABETES MELLITUS, TYPE II 01/02/2007   HYPERLIPIDEMIA 06/18/2007   HYPERTENSION 01/02/2007   RENAL INSUFFICIENCY 02/05/2008   Stroke (HCC) 2019    Social History   Socioeconomic History   Marital status: Married    Spouse name: Not on file   Number of children: Not on file   Years of  education: Not on file   Highest education level: Not on file  Occupational History   Not on file  Tobacco Use   Smoking status: Never   Smokeless tobacco: Never  Vaping Use   Vaping status: Never Used  Substance and Sexual Activity   Alcohol use: No   Drug use: No   Sexual activity: Not on file  Other Topics Concern   Not on file  Social History Narrative   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 Likes to go to football games.    Social Drivers of Corporate investment banker Strain: Low Risk  (02/14/2022)   Overall Financial Resource Strain (CARDIA)    Difficulty of Paying Living Expenses: Not hard at all  Food Insecurity: No Food Insecurity (11/04/2022)   Hunger Vital Sign    Worried About Running Out of Food in the Last Year: Never true    Ran Out of Food in the Last Year: Never true  Transportation Needs: No Transportation Needs (08/17/2022)   PRAPARE - Administrator, Civil Service (Medical): No    Lack of Transportation (Non-Medical): No  Physical Activity: Inactive (08/17/2022)   Exercise Vital Sign  Days of Exercise per Week: 0 days    Minutes of Exercise per Session: 0 min  Stress: No Stress Concern Present (08/17/2022)   [employer] of Occupational Health - Occupational Stress Questionnaire    Feeling of Stress : Not at all  Social Connections: Socially Integrated (08/17/2022)   Social Connection and Isolation Panel [NHANES]    Frequency of Communication with Friends and Family: More than three times a week    Frequency of Social Gatherings with Friends and Family: More than three times a week    Attends Religious Services: More than 4 times per year    Active Member of Clubs or Organizations: Yes    Attends Banker Meetings: More than 4 times per year    Marital Status: Married  Catering manager Violence: Not At Risk  (08/17/2022)   Humiliation, Afraid, Rape, and Kick questionnaire    Fear of Current or Ex-Partner: No    Emotionally Abused: No    Physically Abused: No    Sexually Abused: No    Past Surgical History:  Procedure Laterality Date   LOOP RECORDER INSERTION N/A 07/11/2017   Procedure: LOOP RECORDER INSERTION;  Surgeon: Hillis Range, MD;  Location: [facility] INVASIVE CV LAB;  Service: Cardiovascular;  Laterality: N/A;   SHOULDER SURGERY     left   TEE WITHOUT CARDIOVERSION N/A 07/11/2017   Procedure: TRANSESOPHAGEAL ECHOCARDIOGRAM (TEE);  Surgeon: Lars Masson, MD;  [facility] ENDOSCOPY;  Service: Cardiovascular;  Laterality: N/A;   TRANSURETHRAL RESECTION OF PROSTATE     history of retention/hematuria     Family History  Problem Relation Age of Onset   Hypertension Mother    Diabetes Mother    Stroke Father    Stroke Maternal Grandmother    Colon cancer Neg Hx    Esophageal cancer Neg Hx    Rectal cancer Neg Hx    Stomach cancer Neg Hx     Allergies  Allergen Reactions   Evolocumab Hypertension    Other reaction(s): Other (see comments)   5-Alpha Reductase Inhibitors Other (See Comments)    myalgia Other reaction(s): Other (see comments)   Bactrim [Sulfamethoxazole-Trimethoprim] Hives, Itching and Swelling   Penicillins Hives    Has patient had a PCN reaction causing immediate rash, facial/tongue/throat swelling, SOB or lightheadedness with hypotension: Unk Has patient had a PCN reaction causing severe rash involving mucus membranes or skin necrosis: Unk Has patient had a PCN reaction that required hospitalization: Unk Has patient had a PCN reaction occurring within the last 10 years: No If all of the above answers are "NO", then may proceed with Cephalosporin use. Other reaction(s): Other (see comments)   Statins     myalgia Other reaction(s): Other (see comments)   Sulfa Antibiotics     Other reaction(s): Other (see comments)   Sulfa Drugs Cross Reactors Hives, Itching  and Swelling   Sulfamethoxazole-Trimethoprim Hives and Itching    Other reaction(s): Other (see comments)    Current Outpatient Medications on File Prior to Visit  Medication Sig Dispense Refill   amLODipine (NORVASC) 5 MG tablet Take 5 mg by mouth in the morning and at bedtime.     aspirin EC 81 MG tablet Take 1 tablet (81 mg total) by mouth daily. Swallow whole. 90 tablet 3   B Complex Vitamins (VITAMIN B COMPLEX) TABS Take by mouth.     Blood Glucose Monitoring Suppl (ONETOUCH VERIO) w/Device KIT Use with existing test strips 1 kit kit   calcitRIOL (ROCALTROL)  0.25 MCG capsule Take 1 capsule by mouth every other day.     Cholecalciferol 50 MCG (2000 UT) CAPS Take 1 capsule by mouth daily.     ezetimibe (ZETIA) 10 MG tablet TAKE 1 TABLET(10 MG) BY MOUTH DAILY 90 tablet 3   ferrous sulfate 325 (65 FE) MG tablet Take 325 mg by mouth 2 (two) times daily with a meal.     hydrALAZINE (APRESOLINE) 100 MG tablet Take 100 mg by mouth 3 (three) times daily.     labetalol (NORMODYNE) 200 MG tablet TAKE 1 TABLET(200 MG) BY MOUTH THREE TIMES DAILY 270 tablet 3   LANTUS SOLOSTAR 100 UNIT/ML Solostar Pen ADMINISTER 9 UNITS UNDER THE SKIN TWICE DAILY (Patient taking differently: Takes 9 units in AM and 3 units at PM) 15 mL 0   latanoprost (XALATAN) 0.005 % ophthalmic solution 1 drop at bedtime.     nateglinide (STARLIX) 60 MG tablet TAKE 1 TABLET BY MOUTH ONCE A DAY WITH MEALS     ONETOUCH VERIO test strip USE TO TEST TWICE DAILY AS DIRECTED 200 strip 2   timolol (TIMOPTIC) 0.5 % ophthalmic solution 1 drop 2 (two) times daily.     valsartan (DIOVAN) 160 MG tablet Take 80 mg by mouth 2 (two) times daily.     No current facility-administered medications on file prior to visit.    BP (!) 160/100   Pulse 69   Temp 98.3 F (36.8 C) (Oral)   Ht 6\' 2"  (1.88 m)   Wt 230 lb (104.3 kg)   SpO2 99%   BMI 29.53 kg/m       Objective:   Physical Exam Vitals and nursing note reviewed.  Constitutional:       Appearance: Normal appearance. He is obese.  Cardiovascular:     Rate and Rhythm: Normal rate and regular rhythm.     Pulses: Normal pulses.     Heart sounds: Normal heart sounds.  Pulmonary:     Effort: Pulmonary effort is normal.     Breath sounds: Normal breath sounds.  Skin:    General: Skin is warm and dry.  Neurological:     General: No focal deficit present.     Mental Status: He is alert and oriented to person, place, and time.  Psychiatric:        Mood and Affect: Mood normal.        Behavior: Behavior normal.        Thought Content: Thought content normal.        Judgment: Judgment normal.       Assessment & Plan:  1. Diabetes mellitus treated with oral medication (HCC) (Primary)  - POC HgB A1c- 7.1 - Continue with Lantus 9 units daily and Starlix. He can add back the night time insulin if BS start to elevate - Follow up in 3 months   2. Current use of insulin (HCC)  - POC HgB A1c  3. Hypertensive kidney disease - elevated today but he did not take his medication  - Comprehensive metabolic panel; Future   Shirline Frees, NP

## 2023-06-20 NOTE — Patient Instructions (Signed)
Health Maintenance Due  Topic Date Due   Zoster Vaccines- Shingrix (1 of 2) Never done   DTaP/Tdap/Td (2 - Tdap) 11/14/2019   COVID-19 Vaccine (5 - 2024-25 season) 03/05/2023   Medicare Annual Wellness (AWV)  08/18/2023       09/16/2022    4:08 PM 08/17/2022   10:06 AM 11/16/2021    7:09 AM  Depression screen PHQ 2/9  Decreased Interest 0 0 1  Down, Depressed, Hopeless 0 0 1  PHQ - 2 Score 0 0 2  Altered sleeping 3  2  Tired, decreased energy 1  1  Change in appetite 0  2  Feeling bad or failure about yourself  0  1  Trouble concentrating 0  1  Moving slowly or fidgety/restless 0  1  Suicidal thoughts 0  0  PHQ-9 Score 4  10  Difficult doing work/chores Not difficult at all  Somewhat difficult

## 2023-06-21 DIAGNOSIS — N2889 Other specified disorders of kidney and ureter: Secondary | ICD-10-CM | POA: Diagnosis not present

## 2023-06-21 DIAGNOSIS — N200 Calculus of kidney: Secondary | ICD-10-CM | POA: Diagnosis not present

## 2023-06-21 DIAGNOSIS — N281 Cyst of kidney, acquired: Secondary | ICD-10-CM | POA: Diagnosis not present

## 2023-06-22 DIAGNOSIS — R400 Somnolence: Secondary | ICD-10-CM

## 2023-06-22 DIAGNOSIS — G4736 Sleep related hypoventilation in conditions classified elsewhere: Secondary | ICD-10-CM | POA: Diagnosis not present

## 2023-06-22 NOTE — Procedures (Signed)
Patient Name: [name] [name], [name] [name] Date: 06/16/2023 Gender: Male D.O.B: 09/11/1945 Age (years): 13 Referring Provider: Babette Relic Parrett Height (inches): 72 Interpreting Physician: Cyril Mourning MD, ABSM Weight (lbs): 229 RPSGT: Cherylann Parr BMI: 31 MRN: [id number] Neck Size: 17.00 <br> <br> CLINICAL INFORMATION Sleep Study Type: NPSG  Indication for sleep study: Snoring, Witnesses Apnea / Gasping During Sleep   Epworth Sleepiness Score: 9   SLEEP STUDY TECHNIQUE As per the AASM Manual for the Scoring of Sleep and Associated Events v2.3 (April 2016) with a hypopnea requiring 4% desaturations.  The channels recorded and monitored were frontal, central and occipital EEG, electrooculogram (EOG), submentalis EMG (chin), nasal and oral airflow, thoracic and abdominal wall motion, anterior tibialis EMG, snore microphone, electrocardiogram, and pulse oximetry.  MEDICATIONS Medications self-administered by patient taken the night of the study : N/A  SLEEP ARCHITECTURE The study was initiated at 9:32:45 PM and ended at 4:56:17 AM.  Sleep onset time was 11.7 minutes and the sleep efficiency was 41.6%. The total sleep time was 184.3 minutes.  Stage REM latency was 398.0 minutes.  The patient spent 5.2% of the night in stage N1 sleep, 88.3% in stage N2 sleep, 0.3% in stage N3 and 6.2% in REM.  Alpha intrusion was absent.  Supine sleep was 63.11%.  RESPIRATORY PARAMETERS The overall apnea/hypopnea index (AHI) was 1.0 per hour. There were 2 total apneas, including 1 obstructive, 1 central and 0 mixed apneas. There were 1 hypopneas and 37 RERAs.  The AHI during Stage REM sleep was 0.0 per hour.  AHI while supine was 0.5 per hour.  The mean oxygen saturation was 96.0%. The minimum SpO2 during sleep was 91.0%.  moderate snoring was noted during this study.  CARDIAC DATA The 2 lead EKG demonstrated sinus rhythm. The mean heart rate was 58.3 beats per minute. Other EKG findings  include: None.   LEG MOVEMENT DATA The total PLMS were 0 with a resulting PLMS index of 0.0. Associated arousal with leg movement index was 0.0 .  IMPRESSIONS - No significant obstructive sleep apnea occurred during this study (AHI = 1.0/h). - No significant central sleep apnea occurred during this study (CAI = 0.3/h). - The patient had minimal or no oxygen desaturation during the study (Min O2 = 91.0%) - The patient snored with moderate snoring volume. - No cardiac abnormalities were noted during this study. - Clinically significant periodic limb movements did not occur during sleep. No significant associated arousals.   DIAGNOSIS - Mild Nocturnal Hypoxia (G47.36)   RECOMMENDATIONS - Avoid alcohol, sedatives and other CNS depressants that may worsen sleep apnea and disrupt normal sleep architecture. - Sleep hygiene should be reviewed to assess factors that may improve sleep quality. - Weight management and regular exercise should be initiated or continued if appropriate.  [Electronically signed] 06/22/2023 01:48 PM  Cyril Mourning MD, ABSM Diplomate, American Board of Sleep Medicine NPI: [id number]

## 2023-06-26 ENCOUNTER — Telehealth: Payer: Self-pay | Admitting: Adult Health

## 2023-06-26 NOTE — Telephone Encounter (Signed)
LVM for patient to return call. 

## 2023-06-26 NOTE — Telephone Encounter (Signed)
Please let patient know sleep study 06/16/23  was negative for sleep apnea.  Continue with office visit recommendations.

## 2023-06-29 ENCOUNTER — Encounter: Payer: Self-pay | Admitting: *Deleted

## 2023-06-29 NOTE — Telephone Encounter (Signed)
Msg sent via mychart

## 2023-08-01 ENCOUNTER — Ambulatory Visit: Payer: Self-pay | Admitting: Adult Health

## 2023-08-01 NOTE — Telephone Encounter (Signed)
Fyi: pt has been scheduled

## 2023-08-01 NOTE — Telephone Encounter (Signed)
Chief Complaint: Fluctuating BP and HR Symptoms: Lethargy  Frequency: A few days Pertinent Negatives: Patient denies chest pain, headache, vision changes, SOB Disposition: [] ED /[] Urgent Care (no appt availability in office) / [x] Appointment(In office/virtual)/ []  [facility]/ [] Home Care/ [] Refused Recommended Disposition /[] [facility]/ []  Follow-up with PCP Additional Notes: Spoke to patient's wife on behalf of her husband who has been experiencing fluctuating BP and HR. Wife stated the the patient's BP this morning was 107/76, but the systolic reading was in the 170s over the weekend. Wife also stated that his HR has been measuring in the 130s, while at rest. Wife stated that the patient feels lethargic and tired and sometimes has dizzy spells. Wife denied chest pain, vision changes, SOB, and headache in her husband. Patient is currently taking multiple BP medications. Advised patient to be seen in the office within 3 days. Scheduled patient for tomorrow morning with PCP. Advised wife/patient to call back if symptoms worsen. Patient complied.     Reason for Disposition  Systolic BP  >= 160 OR Diastolic >= 100  Answer Assessment - Initial Assessment Questions 1. BLOOD PRESSURE: "What is the blood pressure?" "Did you take at least two measurements 5 minutes apart?"     States on Saturday or Sunday- systolic was in the 170s     This morning- 107/76  2. ONSET: "When did you take your blood pressure?"     Off and on for a few days  3. HOW: "How did you take your blood pressure?" (e.g., automatic home BP monitor, visiting nurse)     Dual BP monitor at home   4. HISTORY: "Do you have a history of high blood pressure?"     Yes  5. MEDICINES: "Are you taking any medicines for blood pressure?" "Have you missed any doses recently?"     Yes, taking a few BP medications, missed 1 dose of labetalol  6. OTHER SYMPTOMS: "Do you have any symptoms?" (e.g., blurred vision, chest  pain, difficulty breathing, headache, weakness)     "Low energy"/lethargic, elevated HR (in 130s when resting), sometimes has dizzy spells, denies blurred vision, denies chest pain, denies difficulty breathing, denies headache  Protocols used: Blood Pressure - High-A-AH

## 2023-08-02 ENCOUNTER — Encounter: Payer: Self-pay | Admitting: Adult Health

## 2023-08-02 ENCOUNTER — Ambulatory Visit (INDEPENDENT_AMBULATORY_CARE_PROVIDER_SITE_OTHER): Payer: Medicare Other | Admitting: Adult Health

## 2023-08-02 ENCOUNTER — Ambulatory Visit: Payer: Medicare Other | Attending: Adult Health

## 2023-08-02 VITALS — BP 140/88 | HR 86 | Temp 98.0°F | Wt 225.0 lb

## 2023-08-02 DIAGNOSIS — N184 Chronic kidney disease, stage 4 (severe): Secondary | ICD-10-CM

## 2023-08-02 DIAGNOSIS — R009 Unspecified abnormalities of heart beat: Secondary | ICD-10-CM

## 2023-08-02 DIAGNOSIS — I129 Hypertensive chronic kidney disease with stage 1 through stage 4 chronic kidney disease, or unspecified chronic kidney disease: Secondary | ICD-10-CM

## 2023-08-02 LAB — PHOSPHORUS: Phosphorus: 3.7 mg/dL (ref 2.3–4.6)

## 2023-08-02 LAB — COMPREHENSIVE METABOLIC PANEL
ALT: 11 U/L (ref 0–53)
AST: 13 U/L (ref 0–37)
Albumin: 4.3 g/dL (ref 3.5–5.2)
Alkaline Phosphatase: 58 U/L (ref 39–117)
BUN: 39 mg/dL — ABNORMAL HIGH (ref 6–23)
CO2: 23 meq/L (ref 19–32)
Calcium: 9.3 mg/dL (ref 8.4–10.5)
Chloride: 108 meq/L (ref 96–112)
Creatinine, Ser: 3.07 mg/dL — ABNORMAL HIGH (ref 0.40–1.50)
GFR: 19.01 mL/min — ABNORMAL LOW (ref >60.00)
Glucose, Bld: 185 mg/dL — ABNORMAL HIGH (ref 70–99)
Potassium: 4 meq/L (ref 3.5–5.1)
Sodium: 139 meq/L (ref 135–145)
Total Bilirubin: 0.5 mg/dL (ref 0.2–1.2)
Total Protein: 7.2 g/dL (ref 6.0–8.3)

## 2023-08-02 LAB — MAGNESIUM: Magnesium: 1.9 mg/dL (ref 1.5–2.5)

## 2023-08-02 NOTE — Progress Notes (Unsigned)
EP to read.

## 2023-08-02 NOTE — Progress Notes (Signed)
Subjective:    Patient ID: [name] [name], male    DOB: [birth year], 78 y.o.   MRN: [id number]  HPI 78 year old male who  has a past medical history of Anemia, Cataract, CKD (chronic kidney disease) stage 4, GFR 15-29 ml/min (HCC), DIABETES MELLITUS, TYPE II (01/02/2007), HYPERLIPIDEMIA (06/18/2007), HYPERTENSION (01/02/2007), RENAL INSUFFICIENCY (02/05/2008), and Stroke (HCC) (2019).  He presents to the office today with his wife. He has been experiencing fluctuations in his BP and HR at home over the last two weeks. Marland Kitchen His wife reports that over last week his systolic BP readings were in the 170's. His heart rate has been going as high as 130's while at rest and as ow as 50.   He has not had any chest pain or syncope  but he will feel dizzy,short of breath  lethargic and tired.   His BP is currently controlled with Hydralazine 100 mg TID, Labetalol 200 mg TID, Norvasc 5 mg BID, and Valsartan 160 mg daily.   He had lab work done by Nephrology about two months ago which showed a Vit D 47, Cr 3.17, BUN 47, GFR 20, Phos normal at 3.5, Magnesium at 2, and PTH elevated at 159.   Patient does have a loop recorder that was placed in 2019   Review of Systems See HPI   Past Medical History:  Diagnosis Date   Anemia    Cataract    CKD (chronic kidney disease) stage 4, GFR 15-29 ml/min (HCC)    DIABETES MELLITUS, TYPE II 01/02/2007   HYPERLIPIDEMIA 06/18/2007   HYPERTENSION 01/02/2007   RENAL INSUFFICIENCY 02/05/2008   Stroke (HCC) 2019    Social History   Socioeconomic History   Marital status: Married    Spouse name: Not on file   Number of children: Not on file   Years of education: Not on file   Highest education level: Not on file  Occupational History   Not on file  Tobacco Use   Smoking status: Never   Smokeless tobacco: Never  Vaping Use   Vaping status: Never Used  Substance and Sexual Activity   Alcohol use: No   Drug use: No   Sexual activity: Not on file  Other  Topics Concern   Not on file  Social History Narrative   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 Likes to go to football games.    Social Drivers of Corporate investment banker Strain: Low Risk  (02/14/2022)   Overall Financial Resource Strain (CARDIA)    Difficulty of Paying Living Expenses: Not hard at all  Food Insecurity: No Food Insecurity (11/04/2022)   Hunger Vital Sign    Worried About Running Out of Food in the Last Year: Never true    Ran Out of Food in the Last Year: Never true  Transportation Needs: No Transportation Needs (08/17/2022)   PRAPARE - Administrator, Civil Service (Medical): No    Lack of Transportation (Non-Medical): No  Physical Activity: Inactive (08/17/2022)   Exercise Vital Sign    Days of Exercise per Week: 0 days    Minutes of Exercise per Session: 0 min  Stress: No Stress Concern Present (08/17/2022)   [employer] of Occupational Health - Occupational Stress Questionnaire    Feeling of Stress : Not at all  Social Connections: Socially Integrated (08/17/2022)   Social Connection and Isolation Panel [NHANES]    Frequency of Communication with Friends and Family: More than three times a week    Frequency of Social Gatherings with Friends and Family: More than three times a week    Attends Religious Services: More than 4 times per year    Active Member of Clubs or Organizations: Yes    Attends Banker Meetings: More than 4 times per year    Marital Status: Married  Catering manager Violence: Not At Risk (08/17/2022)   Humiliation, Afraid, Rape, and Kick questionnaire    Fear of Current or Ex-Partner: No    Emotionally Abused: No    Physically Abused: No    Sexually Abused: No    Past Surgical History:  Procedure Laterality Date   LOOP RECORDER INSERTION N/A 07/11/2017   Procedure: LOOP RECORDER INSERTION;   Surgeon: Hillis Range, MD;  Location: [facility] INVASIVE CV LAB;  Service: Cardiovascular;  Laterality: N/A;   SHOULDER SURGERY     left   TEE WITHOUT CARDIOVERSION N/A 07/11/2017   Procedure: TRANSESOPHAGEAL ECHOCARDIOGRAM (TEE);  Surgeon: Lars Masson, MD;  Location: [facility] ENDOSCOPY;  Service: Cardiovascular;  Laterality: N/A;   TRANSURETHRAL RESECTION OF PROSTATE     history of retention/hematuria     Family History  Problem Relation Age of Onset   Hypertension Mother    Diabetes Mother    Stroke Father    Stroke Maternal Grandmother    Colon cancer Neg Hx    Esophageal cancer Neg Hx    Rectal cancer Neg Hx    Stomach cancer Neg Hx     Allergies  Allergen Reactions   Evolocumab Hypertension    Other reaction(s): Other (see comments)   5-Alpha Reductase Inhibitors Other (See Comments)    myalgia Other reaction(s): Other (see comments)   Bactrim [Sulfamethoxazole-Trimethoprim] Hives, Itching and Swelling   Penicillins Hives    Has patient had a PCN reaction causing immediate rash, facial/tongue/throat swelling, SOB or lightheadedness with hypotension: Unk Has patient had a PCN reaction causing severe rash involving mucus membranes or skin necrosis: Unk Has patient had a PCN reaction that required hospitalization: Unk Has patient had a PCN reaction occurring within the last 10 years: No If all of the above answers are "NO", then may proceed with Cephalosporin use. Other reaction(s): Other (see comments)   Statins     myalgia Other reaction(s): Other (see comments)   Sulfa Antibiotics     Other reaction(s): Other (see comments)   Sulfa Drugs Cross Reactors Hives, Itching and Swelling   Sulfamethoxazole-Trimethoprim Hives and Itching    Other reaction(s): Other (see comments)    Current Outpatient Medications on File Prior to Visit  Medication Sig Dispense Refill   amLODipine (NORVASC) 5 MG tablet Take 5 mg by mouth in the morning and at bedtime.     aspirin EC 81 MG tablet  Take 1 tablet (81 mg total) by mouth daily. Swallow whole. 90 tablet 3   B Complex Vitamins (VITAMIN B COMPLEX) TABS Take by mouth.     Blood Glucose Monitoring Suppl (ONETOUCH VERIO) w/Device KIT Use with existing test strips 1 kit kit   calcitRIOL (ROCALTROL) 0.25 MCG capsule Take 1 capsule by mouth every other day.     Cholecalciferol 50 MCG (2000 UT) CAPS Take 1 capsule by mouth daily.     ezetimibe (ZETIA) 10 MG tablet TAKE 1 TABLET(10 MG) BY MOUTH DAILY 90 tablet 3  ferrous sulfate 325 (65 FE) MG tablet Take 325 mg by mouth 2 (two) times daily with a meal.     hydrALAZINE (APRESOLINE) 100 MG tablet Take 100 mg by mouth 3 (three) times daily.     labetalol (NORMODYNE) 200 MG tablet TAKE 1 TABLET(200 MG) BY MOUTH THREE TIMES DAILY 270 tablet 3   LANTUS SOLOSTAR 100 UNIT/ML Solostar Pen ADMINISTER 9 UNITS UNDER THE SKIN TWICE DAILY (Patient taking differently: Takes 9 units in AM and 3 units at PM) 15 mL 0   latanoprost (XALATAN) 0.005 % ophthalmic solution 1 drop at bedtime.     nateglinide (STARLIX) 60 MG tablet TAKE 1 TABLET BY MOUTH ONCE A DAY WITH MEALS     ONETOUCH VERIO test strip USE TO TEST TWICE DAILY AS DIRECTED 200 strip 2   timolol (TIMOPTIC) 0.5 % ophthalmic solution 1 drop 2 (two) times daily.     valsartan (DIOVAN) 160 MG tablet Take 80 mg by mouth 2 (two) times daily.     No current facility-administered medications on file prior to visit.    BP (!) 140/88   Pulse 86   Temp 98 F (36.7 C) (Oral)   Wt 225 lb (102.1 kg)   SpO2 100%   BMI 28.89 kg/m       Objective:   Physical Exam Vitals and nursing note reviewed.  Constitutional:      Appearance: Normal appearance.  Cardiovascular:     Rate and Rhythm: Normal rate and regular rhythm.     Pulses: Normal pulses.     Heart sounds: Normal heart sounds.  Pulmonary:     Effort: Pulmonary effort is normal.     Breath sounds: Normal breath sounds.  Musculoskeletal:        General: Normal range of motion.   Skin:    General: Skin is warm and dry.     Capillary Refill: Capillary refill takes less than 2 seconds.  Neurological:     General: No focal deficit present.     Mental Status: He is alert and oriented to person, place, and time.  Psychiatric:        Mood and Affect: Mood normal.        Behavior: Behavior normal.        Thought Content: Thought content normal.        Judgment: Judgment normal.        Assessment & Plan:  1. Abnormal heart rate (Primary) - Likely from CKD but will r/o cardiac arrhythmia with Zio Patch  - EKG 12-Lead - Rate 61  Atrial Rhythm.Poor R-wave progression -may be secondary to pulmonary disease  consider old anterior infarct. -Diffuse nonspecific T-abnormality.Comparable to previous EKGs.  - Will check for electrolyte abnormality and will order Zio patch since battery to loop recorder is dead.  - Comprehensive metabolic panel; Future - Magnesium; Future - Phosphorus; Future - Phosphorus - Magnesium - Comprehensive metabolic panel - LONG TERM MONITOR (3-14 DAYS); Future  2. Hypertensive kidney disease - Varying blood pressure readings from CKD. Reassurance given to family and patient.  - Comprehensive metabolic panel; Future - Magnesium; Future - Phosphorus; Future - Phosphorus - Magnesium - Comprehensive metabolic panel  3. CKD (chronic kidney disease) stage 4, GFR 15-29 ml/min (HCC) - Continue with current medications - Follow up with Nephrology as directed  Shirline Frees, NP  Time spent with patient today was 34 minutes which consisted of chart review, discussing pulse ate, HTN, and CKD  work up, treatment answering  questions and documentation.

## 2023-08-08 ENCOUNTER — Ambulatory Visit: Payer: Medicare Other | Admitting: Adult Health

## 2023-08-09 ENCOUNTER — Ambulatory Visit (INDEPENDENT_AMBULATORY_CARE_PROVIDER_SITE_OTHER): Payer: Medicare Other

## 2023-08-09 ENCOUNTER — Ambulatory Visit: Payer: Medicare Other | Admitting: Adult Health

## 2023-08-09 ENCOUNTER — Encounter: Payer: Self-pay | Admitting: Adult Health

## 2023-08-09 VITALS — BP 130/60 | HR 66 | Temp 98.7°F | Ht 75.0 in | Wt 227.4 lb

## 2023-08-09 DIAGNOSIS — J40 Bronchitis, not specified as acute or chronic: Secondary | ICD-10-CM | POA: Diagnosis not present

## 2023-08-09 DIAGNOSIS — I517 Cardiomegaly: Secondary | ICD-10-CM | POA: Diagnosis not present

## 2023-08-09 DIAGNOSIS — R0600 Dyspnea, unspecified: Secondary | ICD-10-CM | POA: Insufficient documentation

## 2023-08-09 DIAGNOSIS — R0609 Other forms of dyspnea: Secondary | ICD-10-CM | POA: Diagnosis not present

## 2023-08-09 DIAGNOSIS — R0602 Shortness of breath: Secondary | ICD-10-CM | POA: Diagnosis not present

## 2023-08-09 DIAGNOSIS — R400 Somnolence: Secondary | ICD-10-CM | POA: Diagnosis not present

## 2023-08-09 NOTE — Assessment & Plan Note (Signed)
 Dyspnea-seems to be have been going on for a long time.  Gets short of breath and winded with walking along with general fatigue.  Patient has no history of known pulmonary disease such as asthma or COPD.  He is a never smoker.  Has no associated symptoms such as cough or wheezing.  Suspect this is multifactorial.  Will check a chest x-ray.  He has no ambulatory desaturations.  O2 saturations remain very good on room air at 98 to 100%.  Suspect this is multifactorial with multiple associated comorbidities including congestive heart failure, chronic kidney disease, mild anemia along with physical deconditioning. As long as chest x-ray is unremarkable would recommend ongoing follow-up with cardiology and primary care.  If needed we can set patient up for pulmonary function testing if symptoms persist

## 2023-08-09 NOTE — Progress Notes (Signed)
 @Patient  ID: [name] [name], male    DOB: Mar 21, 1946, 78 y.o.   MRN: [id number]  Chief Complaint  Patient presents with   Follow-up    Referring provider: Merna Huxley, NP  HPI: 78 year old male never smoker seen for sleep consult April 20, 2023 for sleep consult for restless sleep and daytime sleepiness with workup negative for sleep apnea or nocturnal hypoxemia. Medical history is significant for hypertension, stage IV chronic kidney disease, congestive heart failure and stroke, DM , mild anemia  Retired from the fire department  TEST/EVENTS :  In-lab sleep study June 16, 2023 showed no significant sleep apnea with AHI at 1.0/hour and no significant desaturations with minimum O2 saturations at 91%.  Positive snoring.  08/09/2023 Follow up : Snoring  Patient returns for a follow-up visit.  Patient was seen in October for sleep consult for restless sleep and daytime sleepiness.  Patient was set up for an in lab sleep study that showed no significant sleep apnea with AHI at 1.0/hour and SpO2 low at 91%.  We discussed his sleep study results with patient and wife today in the office. Patient says he continues to have fatigue and low energy. Does complain that he gets winded with activities and walking.  Has no history of asthma or COPD.  He is a never smoker.  Has no significant cough or wheezing.  Patient does have a history of congestive heart failure, chronic kidney disease and mild anemia.  He is followed by cardiology.  Also is currently doing a Zio patch from primary care that he is starting this week. He is a retired it sales professional.  Denies any chest pain, increased leg swelling nausea vomiting or diarrhea.  Walk test today in office shows no desaturations O2 saturations remained not at 98% to 100% on room air.          Allergies  Allergen Reactions   Evolocumab  Hypertension    Other reaction(s): Other (see comments)   5-Alpha Reductase Inhibitors Other (See Comments)     myalgia Other reaction(s): Other (see comments)   Bactrim [Sulfamethoxazole-Trimethoprim] Hives, Itching and Swelling   Penicillins Hives    Has patient had a PCN reaction causing immediate rash, facial/tongue/throat swelling, SOB or lightheadedness with hypotension: Unk Has patient had a PCN reaction causing severe rash involving mucus membranes or skin necrosis: Unk Has patient had a PCN reaction that required hospitalization: Unk Has patient had a PCN reaction occurring within the last 10 years: No If all of the above answers are NO, then may proceed with Cephalosporin use. Other reaction(s): Other (see comments)   Statins     myalgia Other reaction(s): Other (see comments)   Sulfa Antibiotics     Other reaction(s): Other (see comments)   Sulfa Drugs Cross Reactors Hives, Itching and Swelling   Sulfamethoxazole-Trimethoprim Hives and Itching    Other reaction(s): Other (see comments)    Immunization History  Administered Date(s) Administered   Influenza, High Dose Seasonal PF 06/19/2018   PFIZER(Purple Top)SARS-COV-2 Vaccination 01/18/2020, 02/08/2020, 05/21/2021   Pfizer(Comirnaty )Fall Seasonal Vaccine 12 years and older 11/18/2022   Pneumococcal Conjugate-13 01/03/2019   Pneumococcal Polysaccharide-23 12/20/2011   Td 11/13/2009    Past Medical History:  Diagnosis Date   Anemia    Cataract    CKD (chronic kidney disease) stage 4, GFR 15-29 ml/min (HCC)    DIABETES MELLITUS, TYPE II 01/02/2007   HYPERLIPIDEMIA 06/18/2007   HYPERTENSION 01/02/2007   RENAL INSUFFICIENCY 02/05/2008   Stroke (HCC) 2019  Tobacco History: Social History   Tobacco Use  Smoking Status Never  Smokeless Tobacco Never   Counseling given: Not Answered   Outpatient Medications Prior to Visit  Medication Sig Dispense Refill   amLODipine  (NORVASC ) 5 MG tablet Take 5 mg by mouth in the morning and at bedtime.     aspirin  EC 81 MG tablet Take 1 tablet (81 mg total) by mouth daily.  Swallow whole. 90 tablet 3   B Complex Vitamins (VITAMIN B COMPLEX) TABS Take by mouth.     Blood Glucose Monitoring Suppl (ONETOUCH VERIO) w/Device KIT Use with existing test strips 1 kit kit   calcitRIOL  (ROCALTROL ) 0.25 MCG capsule Take 1 capsule by mouth every other day.     Cholecalciferol  50 MCG (2000 UT) CAPS Take 1 capsule by mouth daily.     ezetimibe  (ZETIA ) 10 MG tablet TAKE 1 TABLET(10 MG) BY MOUTH DAILY 90 tablet 3   ferrous sulfate 325 (65 FE) MG tablet Take 325 mg by mouth 2 (two) times daily with a meal.     hydrALAZINE  (APRESOLINE ) 100 MG tablet Take 100 mg by mouth 3 (three) times daily.     labetalol  (NORMODYNE ) 200 MG tablet TAKE 1 TABLET(200 MG) BY MOUTH THREE TIMES DAILY 270 tablet 3   LANTUS  SOLOSTAR 100 UNIT/ML Solostar Pen ADMINISTER 9 UNITS UNDER THE SKIN TWICE DAILY (Patient taking differently: 9 Units daily. Takes 9 units in AM) 15 mL 0   latanoprost  (XALATAN ) 0.005 % ophthalmic solution 1 drop at bedtime.     nateglinide  (STARLIX ) 60 MG tablet TAKE 1 TABLET BY MOUTH ONCE A DAY WITH MEALS     ONETOUCH VERIO test strip USE TO TEST TWICE DAILY AS DIRECTED 200 strip 2   timolol  (TIMOPTIC ) 0.5 % ophthalmic solution 1 drop 2 (two) times daily.     valsartan (DIOVAN) 160 MG tablet Take 80 mg by mouth 2 (two) times daily.     No facility-administered medications prior to visit.     Review of Systems:   Constitutional:   No  weight loss, night sweats,  Fevers, chills, +fatigue, or  lassitude.  HEENT:   No headaches,  Difficulty swallowing,  Tooth/dental problems, or  Sore throat,                No sneezing, itching, ear ache, nasal congestion, post nasal drip,   CV:  No chest pain,  Orthopnea, PND, swelling in lower extremities, anasarca, dizziness, palpitations, syncope.   GI  No heartburn, indigestion, abdominal pain, nausea, vomiting, diarrhea, change in bowel habits, loss of appetite, bloody stools.   Resp:   No chest wall deformity  Skin: no rash or  lesions.  GU: no dysuria, change in color of urine, no urgency or frequency.  No flank pain, no hematuria   MS:  No joint pain or swelling.  No decreased range of motion.  No back pain.    Physical Exam  BP 130/60 (BP Location: Left Arm, Cuff Size: Large)   Pulse 66   Temp 98.7 F (37.1 C) (Oral)   Ht 6' 3 (1.905 m)   Wt 227 lb 6.4 oz (103.1 kg)   SpO2 100%   BMI 28.42 kg/m   GEN: A/Ox3; pleasant , NAD, well nourished    HEENT:  [place]/AT,  NOSE-clear, THROAT-clear, no lesions, no postnasal drip or exudate noted.   NECK:  Supple w/ fair ROM; no JVD; normal carotid impulses w/o bruits; no thyromegaly or nodules palpated; no lymphadenopathy.  RESP  Clear  P & A; w/o, wheezes/ rales/ or rhonchi. no accessory muscle use, no dullness to percussion  CARD:  RRR, no m/r/g, no peripheral edema, pulses intact, no cyanosis or clubbing.  GI:   Soft & nt; nml bowel sounds; no organomegaly or masses detected.   Musco: Warm bil, no deformities or joint swelling noted.   Neuro: alert, no focal deficits noted.    Skin: Warm, no lesions or rashes    Lab Results:  CBC    Component Value Date/Time   WBC 7.6 03/21/2023 0835   RBC 4.31 03/21/2023 0835   HGB 11.7 (L) 03/21/2023 0835   HGB 11.1 (L) 02/06/2020 0838   HCT 36.4 (L) 03/21/2023 0835   PLT 264.0 03/21/2023 0835   PLT 238 02/06/2020 0838   MCV 84.4 03/21/2023 0835   MCH 27.4 08/07/2020 0853   MCHC 32.2 03/21/2023 0835   RDW 14.9 03/21/2023 0835   LYMPHSABS 2.3 06/03/2022 0822   MONOABS 0.7 06/03/2022 0822   EOSABS 0.7 06/03/2022 0822   BASOSABS 0.1 06/03/2022 0822    BMET    Component Value Date/Time   NA 139 08/02/2023 1136   NA 137 09/04/2019 0000   K 4.0 08/02/2023 1136   CL 108 08/02/2023 1136   CO2 23 08/02/2023 1136   GLUCOSE 185 (H) 08/02/2023 1136   GLUCOSE 251 (H) 06/13/2006 1050   BUN 39 (H) 08/02/2023 1136   BUN 37 (A) 09/04/2019 0000   CREATININE 3.07 (H) 08/02/2023 1136   CREATININE 2.80 (H)  08/07/2020 0853   CREATININE 2.52 (H) 01/15/2020 0811   CALCIUM 9.3 08/02/2023 1136   GFRNONAA 23 (L) 08/07/2020 0853   GFRAA 30 05/24/2019 0000   GFRAA 25 (L) 04/03/2019 1106    BNP No results found for: BNP  ProBNP No results found for: PROBNP  Administration History     None           No data to display          No results found for: NITRICOXIDE      Assessment & Plan:   Daytime sleepiness Daytime sleepiness, restless sleep and fatigue-workup shows no significant sleep apnea.  In lab sleep study results were reviewed in detail.  Negative - for sleep apnea or nocturnal hypoxemia.  Plan Patient Instructions  Chest xray  Healthy sleep regimen -try to decrease naps/daytime sleeping.  Sleep with head of bed at incline at 30 degree Avoid sleeping on your back.  Do not drive if sleepy  Follow up with Cardiology, Nephrology .  Keep follow up with PCP with Zio patch as planned.  Follow up with our office As needed      Dyspnea Dyspnea-seems to be have been going on for a long time.  Gets short of breath and winded with walking along with general fatigue.  Patient has no history of known pulmonary disease such as asthma or COPD.  He is a never smoker.  Has no associated symptoms such as cough or wheezing.  Suspect this is multifactorial.  Will check a chest x-ray.  He has no ambulatory desaturations.  O2 saturations remain very good on room air at 98 to 100%.  Suspect this is multifactorial with multiple associated comorbidities including congestive heart failure, chronic kidney disease, mild anemia along with physical deconditioning. As long as chest x-ray is unremarkable would recommend ongoing follow-up with cardiology and primary care.  If needed we can set patient up for pulmonary function testing if symptoms  persist     Madelin Stank, NP 08/09/2023

## 2023-08-09 NOTE — Patient Instructions (Addendum)
 Chest xray  Healthy sleep regimen -try to decrease naps/daytime sleeping.  Sleep with head of bed at incline at 30 degree Avoid sleeping on your back.  Do not drive if sleepy  Follow up with Cardiology, Nephrology .  Keep follow up with PCP with Zio patch as planned.  Follow up with our office As needed

## 2023-08-09 NOTE — Assessment & Plan Note (Signed)
 Daytime sleepiness, restless sleep and fatigue-workup shows no significant sleep apnea.  In lab sleep study results were reviewed in detail.  Negative - for sleep apnea or nocturnal hypoxemia.  Plan Patient Instructions  Chest xray  Healthy sleep regimen -try to decrease naps/daytime sleeping.  Sleep with head of bed at incline at 30 degree Avoid sleeping on your back.  Do not drive if sleepy  Follow up with Cardiology, Nephrology .  Keep follow up with PCP with Zio patch as planned.  Follow up with our office As needed

## 2023-08-11 ENCOUNTER — Encounter: Payer: Self-pay | Admitting: *Deleted

## 2023-08-11 NOTE — Progress Notes (Signed)
 ATC patient, no answer.  Left VM to return call Monday after 8 am.  [facility] message with results.

## 2023-08-14 DIAGNOSIS — I480 Paroxysmal atrial fibrillation: Secondary | ICD-10-CM

## 2023-08-14 DIAGNOSIS — R009 Unspecified abnormalities of heart beat: Secondary | ICD-10-CM | POA: Diagnosis not present

## 2023-08-15 ENCOUNTER — Telehealth: Payer: Self-pay | Admitting: Adult Health

## 2023-08-15 NOTE — Telephone Encounter (Signed)
See encounter for more on this [facility] message.

## 2023-08-15 NOTE — Telephone Encounter (Signed)
Please see last [facility] message we sent to PT.   PT's wife (DPR) calling. She did get the [facility] message below and wondered if she should make an appt to address the issues on the Xray and will a CT be ordered or not. Her # is [phone].

## 2023-08-17 NOTE — Telephone Encounter (Signed)
Spoke with patient's spouse advised on cxr results per TP. If sx change or get worse call back and CT will be considered; No changes to be made at this time.

## 2023-08-24 ENCOUNTER — Other Ambulatory Visit: Payer: Self-pay | Admitting: *Deleted

## 2023-08-24 DIAGNOSIS — R059 Cough, unspecified: Secondary | ICD-10-CM

## 2023-08-24 DIAGNOSIS — R0609 Other forms of dyspnea: Secondary | ICD-10-CM

## 2023-08-24 NOTE — Progress Notes (Signed)
 Called and spoke with patient's wife, [name] [name] ([place]), provided results per Madelin Stank NP.   She states that he is still coughing a lot and is very tired from coughing.  She is agreeable to ordering the CT scan.  Advised that I would order the CT scan and once it is approved by insurance they will be contacted by one of our PCCs to schedule.  She verbalized understanding.  Nothing further needed.

## 2023-08-25 ENCOUNTER — Ambulatory Visit: Payer: Medicare Other

## 2023-08-25 VITALS — Ht 74.5 in | Wt 227.0 lb

## 2023-08-25 DIAGNOSIS — Z0000 Encounter for general adult medical examination without abnormal findings: Secondary | ICD-10-CM

## 2023-08-25 NOTE — Progress Notes (Signed)
 Subjective:   [name] [name] is a 78 y.o. male who presents for Medicare Annual/Subsequent preventive examination.  Visit Complete: Virtual I connected with  [name] [name] on 08/25/23 by a audio enabled telemedicine application and verified that I am speaking with the correct person using two identifiers.  Patient Location: Home  Provider Location: Home Office  I discussed the limitations of evaluation and management by telemedicine. The patient expressed understanding and agreed to proceed.  Vital Signs: Because this visit was a virtual/telehealth visit, some criteria may be missing or patient reported. Any vitals not documented were not able to be obtained and vitals that have been documented are patient reported.    Cardiac Risk Factors include: advanced age (>69men, >64 women);diabetes mellitus;hypertension;male gender     Objective:    Today's Vitals   08/25/23 1503  Weight: 227 lb (103 kg)  Height: 6' 2.5" (1.892 m)   Body mass index is 28.76 kg/m.     08/25/2023    3:13 PM 08/17/2022   10:14 AM 06/08/2022    3:10 PM 01/05/2022    9:00 AM 08/16/2021    9:54 AM 08/13/2020    9:11 AM 08/07/2020    9:40 AM  Advanced Directives  Does Patient Have a Medical Advance Directive? Yes Yes Yes Yes Yes Yes Yes  Type of Estate agent of [place];Living will Healthcare Power of [place];Living will Living will;Healthcare Power of Attorney Living will;Healthcare Power of [employer] Power of [place];Living will Healthcare Power of [place];Living will Healthcare Power of [place];Living will  Does patient want to make changes to medical advance directive?   --  No - Patient declined No - Patient declined   Copy of Healthcare Power of Attorney in Chart? No - copy requested No - copy requested  No - copy requested No - copy requested No - copy requested     Current Medications (verified) Outpatient Encounter Medications as of 08/25/2023  Medication Sig    amLODipine (NORVASC) 5 MG tablet Take 5 mg by mouth in the morning and at bedtime.   aspirin EC 81 MG tablet Take 1 tablet (81 mg total) by mouth daily. Swallow whole.   B Complex Vitamins (VITAMIN B COMPLEX) TABS Take by mouth.   Blood Glucose Monitoring Suppl (ONETOUCH VERIO) w/Device KIT Use with existing test strips   calcitRIOL (ROCALTROL) 0.25 MCG capsule Take 1 capsule by mouth every other day.   Cholecalciferol 50 MCG (2000 UT) CAPS Take 1 capsule by mouth daily.   ezetimibe (ZETIA) 10 MG tablet TAKE 1 TABLET(10 MG) BY MOUTH DAILY   ferrous sulfate 325 (65 FE) MG tablet Take 325 mg by mouth 2 (two) times daily with a meal.   hydrALAZINE (APRESOLINE) 100 MG tablet Take 100 mg by mouth 3 (three) times daily.   labetalol (NORMODYNE) 200 MG tablet TAKE 1 TABLET(200 MG) BY MOUTH THREE TIMES DAILY   LANTUS SOLOSTAR 100 UNIT/ML Solostar Pen ADMINISTER 9 UNITS UNDER THE SKIN TWICE DAILY (Patient taking differently: 9 Units daily. Takes 9 units in AM)   latanoprost (XALATAN) 0.005 % ophthalmic solution 1 drop at bedtime.   nateglinide (STARLIX) 60 MG tablet TAKE 1 TABLET BY MOUTH ONCE A DAY WITH MEALS   ONETOUCH VERIO test strip USE TO TEST TWICE DAILY AS DIRECTED   timolol (TIMOPTIC) 0.5 % ophthalmic solution 1 drop 2 (two) times daily.   valsartan (DIOVAN) 160 MG tablet Take 80 mg by mouth 2 (two) times daily.   No facility-administered  encounter medications on file as of 08/25/2023.    Allergies (verified) Evolocumab, 5-alpha reductase inhibitors, Bactrim [sulfamethoxazole-trimethoprim], Penicillins, Statins, Sulfa antibiotics, Sulfa drugs cross reactors, and Sulfamethoxazole-trimethoprim   History: Past Medical History:  Diagnosis Date   Anemia    Cataract    CKD (chronic kidney disease) stage 4, GFR 15-29 ml/min (HCC)    DIABETES MELLITUS, TYPE II 01/02/2007   HYPERLIPIDEMIA 06/18/2007   HYPERTENSION 01/02/2007   RENAL INSUFFICIENCY 02/05/2008   Stroke (HCC) 2019   Past  Surgical History:  Procedure Laterality Date   LOOP RECORDER INSERTION N/A 07/11/2017   Procedure: LOOP RECORDER INSERTION;  Surgeon: Hillis Range, MD;  Location: [facility] INVASIVE CV LAB;  Service: Cardiovascular;  Laterality: N/A;   SHOULDER SURGERY     left   TEE WITHOUT CARDIOVERSION N/A 07/11/2017   Procedure: TRANSESOPHAGEAL ECHOCARDIOGRAM (TEE);  Surgeon: Lars Masson, MD;  Location: [facility] ENDOSCOPY;  Service: Cardiovascular;  Laterality: N/A;   TRANSURETHRAL RESECTION OF PROSTATE     history of retention/hematuria    Family History  Problem Relation Age of Onset   Hypertension Mother    Diabetes Mother    Stroke Father    Stroke Maternal Grandmother    Colon cancer Neg Hx    Esophageal cancer Neg Hx    Rectal cancer Neg Hx    Stomach cancer Neg Hx    Social History   Socioeconomic History   Marital status: Married    Spouse name: Not on file   Number of children: Not on file   Years of education: Not on file   Highest education level: Not on file  Occupational History   Not on file  Tobacco Use   Smoking status: Never   Smokeless tobacco: Never  Vaping Use   Vaping status: Never Used  Substance and Sexual Activity   Alcohol use: No   Drug use: No   Sexual activity: Not on file  Other Topics Concern   Not on file  Social History Narrative   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 Likes to go to football games.    Social Drivers of Corporate investment banker Strain: Low Risk  (08/25/2023)   Overall Financial Resource Strain (CARDIA)    Difficulty of Paying Living Expenses: Not hard at all  Food Insecurity: No Food Insecurity (08/25/2023)   Hunger Vital Sign    Worried About Running Out of Food in the Last Year: Never true    Ran Out of Food in the Last Year: Never true  Transportation Needs: No Transportation Needs (08/25/2023)   PRAPARE -  Administrator, Civil Service (Medical): No    Lack of Transportation (Non-Medical): No  Physical Activity: Inactive (08/25/2023)   Exercise Vital Sign    Days of Exercise per Week: 0 days    Minutes of Exercise per Session: 0 min  Stress: No Stress Concern Present (08/25/2023)   [employer] of Occupational Health - Occupational Stress Questionnaire    Feeling of Stress : Not at all  Social Connections: Socially Integrated (08/25/2023)   Social Connection and Isolation Panel [NHANES]    Frequency of Communication with Friends and Family: More than three times a week    Frequency of Social Gatherings with Friends and Family: More than three times a week    Attends  Religious Services: More than 4 times per year    Active Member of Clubs or Organizations: Yes    Attends Banker Meetings: More than 4 times per year    Marital Status: Married    Tobacco Counseling Counseling given: Not Answered   Clinical Intake:  Pre-visit preparation completed: Yes  Pain : No/denies pain     BMI - recorded: 28.76 Nutritional Status: BMI 25 -29 Overweight Nutritional Risks: None Diabetes: Yes CBG done?: Yes (CBG 140 Per patient) CBG resulted in Enter/ Edit results?: Yes Did pt. bring in CBG monitor from home?: No  How often do you need to have someone help you when you read instructions, pamphlets, or other written materials from your doctor or pharmacy?: 1 - Never  Interpreter Needed?: No  Information entered by :: Theresa Mulligan LPN   Activities of Daily Living    08/25/2023    3:11 PM  In your present state of health, do you have any difficulty performing the following activities:  Hearing? 0  Vision? 0  Difficulty concentrating or making decisions? 0  Walking or climbing stairs? 0  Dressing or bathing? 0  Doing errands, shopping? 0  Preparing Food and eating ? N  Using the Toilet? N  In the past six months, have you accidently leaked urine? N  Do  you have problems with loss of bowel control? N  Managing your Medications? N  Managing your Finances? N  Housekeeping or managing your Housekeeping? N    Patient Care Team: Shirline Frees, NP as PCP - General (Family Medicine) [place], Peter M, MD as PCP - Cardiology (Cardiology) Adegoroye, Charlies Silvers, MD (Nephrology) Sherrill Raring, [facility] (Pharmacist)  Indicate any recent Medical Services you may have received from other than [facility] providers in the past year (date may be approximate).     Assessment:   This is a routine wellness examination for [facility].  Hearing/Vision screen Hearing Screening - Comments:: Denies hearing difficulties   Vision Screening - Comments:: Wears rx glasses - up to date with routine eye exams with  Dr Dione Booze   Goals Addressed               This Visit's Progress     Increase physical activity (pt-stated)         Depression Screen    08/25/2023    3:11 PM 09/16/2022    4:08 PM 08/17/2022   10:06 AM 11/16/2021    7:09 AM 08/19/2021    7:00 AM 08/16/2021    9:50 AM 08/13/2020    9:13 AM  PHQ 2/9 Scores  PHQ - 2 Score 0 0 0 2 3 1  0  PHQ- 9 Score  4  10 13       Fall Risk    08/25/2023    3:12 PM 08/09/2023    9:02 AM 08/17/2022   10:09 AM 11/16/2021    7:08 AM 08/16/2021    9:53 AM  Fall Risk   Falls in the past year? 1 1 1 1  0  Number falls in past yr: 0 0 0 0 0  Injury with Fall? 1  1 0 0  Comment Knee injury followed by medical attention  Fx knee, followed by Orthopedic    Risk for fall due to : No Fall Risks  No Fall Risks History of fall(s) No Fall Risks  Follow up Falls prevention discussed;Falls evaluation completed  Falls prevention discussed Falls evaluation completed     MEDICARE RISK AT HOME:  Medicare Risk at Home Any stairs in or around the home?: Yes If so, are there any without handrails?: No Home free of loose throw rugs in walkways, pet beds, electrical cords, etc?: Yes Adequate lighting in your home to reduce risk of falls?:  Yes Life alert?: No Use of a cane, walker or w/c?: No Grab bars in the bathroom?: No Shower chair or bench in shower?: No Elevated toilet seat or a handicapped toilet?: No  TIMED UP AND GO:  Was the test performed?  No    Cognitive Function:        08/25/2023    3:17 PM 08/17/2022   10:14 AM 08/16/2021    9:55 AM  6CIT Screen  What Year? 0 points 0 points 0 points  What month? 0 points 0 points 0 points  What time? 0 points 0 points 0 points  Count back from 20 0 points 0 points 0 points  Months in reverse 0 points 0 points 0 points  Repeat phrase 0 points 0 points 0 points  Total Score 0 points 0 points 0 points    Immunizations Immunization History  Administered Date(s) Administered   Influenza, High Dose Seasonal PF 06/19/2018   PFIZER(Purple Top)SARS-COV-2 Vaccination 01/18/2020, 02/08/2020, 05/21/2021   Pfizer(Comirnaty)Fall Seasonal Vaccine 12 years and older 11/18/2022   Pneumococcal Conjugate-13 01/03/2019   Pneumococcal Polysaccharide-23 12/20/2011   Td 11/13/2009    TDAP status: Due, Education has been provided regarding the importance of this vaccine. Advised may receive this vaccine at local pharmacy or Health Dept. Aware to provide a copy of the vaccination record if obtained from local pharmacy or Health Dept. Verbalized acceptance and understanding.    Pneumococcal vaccine status: Up to date  Covid-19 vaccine status: Declined, Education has been provided regarding the importance of this vaccine but patient still declined. Advised may receive this vaccine at local pharmacy or Health Dept.or vaccine clinic. Aware to provide a copy of the vaccination record if obtained from local pharmacy or Health Dept. Verbalized acceptance and understanding.  Qualifies for Shingles Vaccine? Yes   Zostavax completed No   Shingrix Completed?: No.    Education has been provided regarding the importance of this vaccine. Patient has been advised to call insurance company to  determine out of pocket expense if they have not yet received this vaccine. Advised may also receive vaccine at local pharmacy or Health Dept. Verbalized acceptance and understanding.  Screening Tests Health Maintenance  Topic Date Due   Zoster Vaccines- Shingrix (1 of 2) Never done   DTaP/Tdap/Td (2 - Tdap) 11/14/2019   COVID-19 Vaccine (5 - 2024-25 season) 03/05/2023   Diabetic kidney evaluation - Urine ACR  03/20/2024   Diabetic kidney evaluation - eGFR measurement  08/01/2024   Medicare Annual Wellness (AWV)  08/24/2024   Pneumonia Vaccine 76+ Years old  Completed   Hepatitis C Screening  Completed   HPV VACCINES  Aged Out   INFLUENZA VACCINE  Discontinued   Fecal DNA (Cologuard)  Discontinued    Health Maintenance  Health Maintenance Due  Topic Date Due   Zoster Vaccines- Shingrix (1 of 2) Never done   DTaP/Tdap/Td (2 - Tdap) 11/14/2019   COVID-19 Vaccine (5 - 2024-25 season) 03/05/2023       Additional Screening:  Hepatitis C Screening: does qualify; Completed 01/03/19  Vision Screening: Recommended annual ophthalmology exams for early detection of glaucoma and other disorders of the eye. Is the patient up to date with their annual eye exam?  Yes  Who is the provider or what is the name of the office in which the patient attends annual eye exams? Dr Dione Booze If pt is not established with a provider, would they like to be referred to a provider to establish care? No .   Dental Screening: Recommended annual dental exams for proper oral hygiene    Community Resource Referral / Chronic Care Management:  CRR required this visit?  No   CCM required this visit?  No     Plan:     I have personally reviewed and noted the following in the patient's chart:   Medical and social history Use of alcohol, tobacco or illicit drugs  Current medications and supplements including opioid prescriptions. Patient is not currently taking opioid prescriptions. Functional ability and  status Nutritional status Physical activity Advanced directives List of other physicians Hospitalizations, surgeries, and ER visits in previous 12 months Vitals Screenings to include cognitive, depression, and falls Referrals and appointments  In addition, I have reviewed and discussed with patient certain preventive protocols, quality metrics, and best practice recommendations. A written personalized care plan for preventive services as well as general preventive health recommendations were provided to patient.     Tillie Rung, LPN   12/22/3084   After Visit Summary: (MyChart) Due to this being a telephonic visit, the after visit summary with patients personalized plan was offered to patient via MyChart   Nurse Notes: None

## 2023-08-25 NOTE — Patient Instructions (Addendum)
 [name] [name] , Thank you for taking time to come for your Medicare Wellness Visit. I appreciate your ongoing commitment to your health goals. Please review the following plan we discussed and let me know if I can assist you in the future.   Referrals/Orders/Follow-Ups/Clinician Recommendations: Increase physical activity.  This is a list of the screening recommended for you and due dates:  Health Maintenance  Topic Date Due   Zoster (Shingles) Vaccine (1 of 2) Never done   DTaP/Tdap/Td vaccine (2 - Tdap) 11/14/2019   COVID-19 Vaccine (5 - 2024-25 season) 03/05/2023   Yearly kidney health urinalysis for diabetes  03/20/2024   Yearly kidney function blood test for diabetes  08/01/2024   Medicare Annual Wellness Visit  08/24/2024   Pneumonia Vaccine  Completed   Hepatitis C Screening  Completed   HPV Vaccine  Aged Out   Flu Shot  Discontinued   Cologuard (Stool DNA test)  Discontinued    Advanced directives: (Copy Requested) Please bring a copy of your health care power of attorney and living will to the office to be added to your chart at your convenience.  Next Medicare Annual Wellness Visit scheduled for next year: Yes

## 2023-08-29 ENCOUNTER — Other Ambulatory Visit: Payer: Self-pay

## 2023-08-29 ENCOUNTER — Other Ambulatory Visit: Payer: Self-pay | Admitting: Adult Health

## 2023-08-29 DIAGNOSIS — I4891 Unspecified atrial fibrillation: Secondary | ICD-10-CM

## 2023-08-30 ENCOUNTER — Telehealth: Payer: Self-pay | Admitting: Adult Health

## 2023-08-30 NOTE — Telephone Encounter (Signed)
 Copied from CRM [id number]. Topic: Clinical - Medication Question >> Aug 30, 2023  9:32 AM Clayton Bibles wrote: Reason for CRM: [name] called and she has a question about his new medication for AFIB, xarellto. She wants to discuss the side effects and dosage for xarellto. >> Aug 30, 2023  9:36 AM Clayton Bibles wrote: [name]'s number is [phone]

## 2023-08-30 NOTE — Telephone Encounter (Signed)
**Note De-identified Hilliary Jock Obfuscation** Please advise 

## 2023-08-31 ENCOUNTER — Other Ambulatory Visit: Payer: Self-pay | Admitting: Adult Health

## 2023-09-01 ENCOUNTER — Telehealth: Payer: Self-pay

## 2023-09-01 NOTE — Telephone Encounter (Signed)
 Copied from CRM [id number]. Topic: General - Other >> Sep 01, 2023 10:56 AM Eunice Blase wrote: Reason for CRM: Pt's wife called asking Xarelto exact dosage, if it needs to be taken with food? And what time of day should it be taken? Please call [phone].

## 2023-09-01 NOTE — Telephone Encounter (Signed)
 Please see other note

## 2023-09-01 NOTE — Telephone Encounter (Signed)
 Patient notified of update  and verbalized understanding.

## 2023-09-04 DIAGNOSIS — H35033 Hypertensive retinopathy, bilateral: Secondary | ICD-10-CM | POA: Diagnosis not present

## 2023-09-04 DIAGNOSIS — H401122 Primary open-angle glaucoma, left eye, moderate stage: Secondary | ICD-10-CM | POA: Diagnosis not present

## 2023-09-04 DIAGNOSIS — Z961 Presence of intraocular lens: Secondary | ICD-10-CM | POA: Diagnosis not present

## 2023-09-04 DIAGNOSIS — H34212 Partial retinal artery occlusion, left eye: Secondary | ICD-10-CM | POA: Diagnosis not present

## 2023-09-04 DIAGNOSIS — H02132 Senile ectropion of right lower eyelid: Secondary | ICD-10-CM | POA: Diagnosis not present

## 2023-09-04 DIAGNOSIS — H0102A Squamous blepharitis right eye, upper and lower eyelids: Secondary | ICD-10-CM | POA: Diagnosis not present

## 2023-09-04 DIAGNOSIS — H40021 Open angle with borderline findings, high risk, right eye: Secondary | ICD-10-CM | POA: Diagnosis not present

## 2023-09-04 DIAGNOSIS — H353131 Nonexudative age-related macular degeneration, bilateral, early dry stage: Secondary | ICD-10-CM | POA: Diagnosis not present

## 2023-09-04 DIAGNOSIS — H0102B Squamous blepharitis left eye, upper and lower eyelids: Secondary | ICD-10-CM | POA: Diagnosis not present

## 2023-09-04 DIAGNOSIS — H1045 Other chronic allergic conjunctivitis: Secondary | ICD-10-CM | POA: Diagnosis not present

## 2023-09-12 ENCOUNTER — Telehealth: Payer: Self-pay | Admitting: Adult Health

## 2023-09-12 ENCOUNTER — Other Ambulatory Visit: Payer: Self-pay | Admitting: Adult Health

## 2023-09-12 DIAGNOSIS — I4891 Unspecified atrial fibrillation: Secondary | ICD-10-CM

## 2023-09-12 NOTE — Telephone Encounter (Signed)
 Wife is aware

## 2023-09-12 NOTE — Telephone Encounter (Signed)
 Copied from CRM [id number]. Topic: Referral - Question >> Sep 12, 2023 10:17 AM Mackie Pai E wrote: Reason for CRM: Patient's wife called in requesting if patient's cardiology referral can go to the "A-fib clinic" instead. Asked wife if she had the specific name of the clinic and she did not know the name. Callback number for wife is [phone] for clarification.

## 2023-09-14 ENCOUNTER — Ambulatory Visit (HOSPITAL_COMMUNITY)
Admission: RE | Admit: 2023-09-14 | Discharge: 2023-09-14 | Disposition: A | Discharge status: Home / Self Care | Source: Ambulatory Visit | Attending: Internal Medicine | Admitting: Internal Medicine

## 2023-09-14 VITALS — BP 160/82 | HR 70 | Ht 74.5 in | Wt 225.4 lb

## 2023-09-14 DIAGNOSIS — D6869 Other thrombophilia: Secondary | ICD-10-CM | POA: Diagnosis not present

## 2023-09-14 DIAGNOSIS — Z794 Long term (current) use of insulin: Secondary | ICD-10-CM | POA: Diagnosis not present

## 2023-09-14 DIAGNOSIS — I480 Paroxysmal atrial fibrillation: Secondary | ICD-10-CM | POA: Diagnosis not present

## 2023-09-14 DIAGNOSIS — Z8673 Personal history of transient ischemic attack (TIA), and cerebral infarction without residual deficits: Secondary | ICD-10-CM | POA: Diagnosis not present

## 2023-09-14 DIAGNOSIS — N184 Chronic kidney disease, stage 4 (severe): Secondary | ICD-10-CM | POA: Diagnosis not present

## 2023-09-14 DIAGNOSIS — I130 Hypertensive heart and chronic kidney disease with heart failure and stage 1 through stage 4 chronic kidney disease, or unspecified chronic kidney disease: Secondary | ICD-10-CM | POA: Diagnosis not present

## 2023-09-14 DIAGNOSIS — Z7901 Long term (current) use of anticoagulants: Secondary | ICD-10-CM | POA: Insufficient documentation

## 2023-09-14 DIAGNOSIS — E1122 Type 2 diabetes mellitus with diabetic chronic kidney disease: Secondary | ICD-10-CM | POA: Insufficient documentation

## 2023-09-14 DIAGNOSIS — I509 Heart failure, unspecified: Secondary | ICD-10-CM | POA: Insufficient documentation

## 2023-09-14 MED ORDER — RIVAROXABAN 15 MG PO TABS
15.0000 mg | ORAL_TABLET | Freq: Every day | ORAL | 3 refills | Status: DC
Start: 2023-09-14 — End: 2024-01-16

## 2023-09-14 NOTE — Patient Instructions (Addendum)
 Stop aspirin  Xarelto 15mg  once a day with supper.    Have lab drawn in 1 month - order attached. Can take to any [facility].

## 2023-09-14 NOTE — Progress Notes (Addendum)
 Primary Care Physician: Shirline Frees, NP Primary Cardiologist: [name], MD Electrophysiologist: None     Referring Physician: Shirline Frees, NP     [name] [name] is a 78 y.o. male with a history of CKD stage 4, CVA, systolic LV dysfunction, HTN, Z6XW, and paroxysmal atrial fibrillation who presents for consultation in the [facility]. Cardiac monitor 08/2023 placed by PCP showed 12% PAF burden. Patient is taking Xarelto 15 mg samples. He is taking ASA 81 mg daily. He has a CHADS2VASC score of 7.  On evaluation today, he is currently in NSR. Patient is here with wife today and they acknowledge he can feel when he is in Afib with palpitations and SOB. He has been compliant with Xarelto 15 mg daily.   Today, he denies symptoms of chest pain, orthopnea, PND, lower extremity edema, dizziness, presyncope, syncope, snoring, daytime somnolence, bleeding, or neurologic sequela. The patient is tolerating medications without difficulties and is otherwise without complaint today.    he has a BMI of Body mass index is 28.55 kg/m.Marland Kitchen Filed Weights   09/14/23 0849  Weight: 102.2 kg    Current Outpatient Medications  Medication Sig Dispense Refill   amLODipine (NORVASC) 5 MG tablet Take 5 mg by mouth in the morning and at bedtime.     B Complex Vitamins (VITAMIN B COMPLEX) TABS Take 1 tablet by mouth every morning.     Blood Glucose Monitoring Suppl (ONETOUCH VERIO) w/Device KIT Use with existing test strips 1 kit kit   calcitRIOL (ROCALTROL) 0.25 MCG capsule Take 1 capsule by mouth every other day.     Cholecalciferol 50 MCG (2000 UT) CAPS Take 1 capsule by mouth daily.     ezetimibe (ZETIA) 10 MG tablet TAKE 1 TABLET(10 MG) BY MOUTH DAILY 90 tablet 3   ferrous sulfate 325 (65 FE) MG tablet Take 325 mg by mouth 2 (two) times daily with a meal.     hydrALAZINE (APRESOLINE) 100 MG tablet Take 100 mg by mouth 3 (three) times daily.     labetalol (NORMODYNE) 200  MG tablet TAKE 1 TABLET(200 MG) BY MOUTH THREE TIMES DAILY 270 tablet 3   LANTUS SOLOSTAR 100 UNIT/ML Solostar Pen ADMINISTER 9 UNITS UNDER THE SKIN TWICE DAILY (Patient taking differently: 9 Units daily. Takes 9 units in AM) 15 mL 0   latanoprost (XALATAN) 0.005 % ophthalmic solution 1 drop at bedtime.     nateglinide (STARLIX) 60 MG tablet TAKE 1 TABLET BY MOUTH ONCE A DAY WITH MEALS     ONETOUCH VERIO test strip USE TO TEST TWICE DAILY AS DIRECTED 200 strip 2   timolol (TIMOPTIC) 0.5 % ophthalmic solution 1 drop 2 (two) times daily.     valsartan (DIOVAN) 160 MG tablet Take 80 mg by mouth 2 (two) times daily.     Rivaroxaban (XARELTO) 15 MG TABS tablet Take 1 tablet (15 mg total) by mouth daily with supper. 30 tablet 3   No current facility-administered medications for this encounter.    Atrial Fibrillation Management history:  Previous antiarrhythmic drugs: none Previous cardioversions: none Previous ablations: none Anticoagulation history: none   ROS- All systems are reviewed and negative except as per the HPI above.  Physical Exam: BP (!) 160/82   Pulse 70   Ht 6' 2.5" (1.892 m)   Wt 102.2 kg   BMI 28.55 kg/m   GEN: Well nourished, well developed in no acute distress NECK: No JVD; No carotid bruits CARDIAC: Regular rate  and rhythm, no murmurs, rubs, gallops RESPIRATORY:  Clear to auscultation without rales, wheezing or rhonchi  ABDOMEN: Soft, non-tender, non-distended EXTREMITIES:  No edema; No deformity   EKG today demonstrates  Vent. rate 70 BPM PR interval 176 ms QRS duration 102 ms QT/QTcB 412/444 ms P-R-T axes -19 120 23 Sinus rhythm with occasional Premature ventricular complexes Right axis deviation Possible Anterior infarct , age undetermined Abnormal ECG When compared with ECG of 08-Jul-2017 09:27, PREVIOUS ECG IS PRESENT  Echo 07/05/22 demonstrated   1. Left ventricular ejection fraction, by estimation, is 45 to 50%. Left  ventricular ejection  fraction by 3D volume is 45 %. The left ventricle has  mildly decreased function. The left ventricle demonstrates global  hypokinesis. The left ventricular  internal cavity size was mildly dilated. Left ventricular diastolic  parameters are consistent with Grade II diastolic dysfunction  (pseudonormalization). Elevated left ventricular end-diastolic pressure.   2. Right ventricular systolic function is normal. The right ventricular  size is normal.   3. Left atrial size was severely dilated.   4. The mitral valve is normal in structure. Trivial mitral valve  regurgitation. No evidence of mitral stenosis.   5. The aortic valve is tricuspid. There is moderate calcification of the  aortic valve. There is moderate thickening of the aortic valve. Aortic  valve regurgitation is not visualized. Aortic valve  sclerosis/calcification is present, without any evidence  of aortic stenosis.   6. Aortic dilatation noted. There is mild dilatation of the ascending  aorta, measuring 38 mm.   7. The inferior vena cava is dilated in size with >50% respiratory  variability, suggesting right atrial pressure of 8 mmHg.   ASSESSMENT & PLAN CHA2DS2-VASc Score = 7  The patient's score is based upon: CHF History: 1 HTN History: 1 Diabetes History: 1 Stroke History: 2 Vascular Disease History: 0 Age Score: 2 Gender Score: 0       ASSESSMENT AND PLAN: Paroxysmal Atrial Fibrillation (ICD10:  I48.0) The patient's CHA2DS2-VASc score is 7, indicating a 11.2% annual risk of stroke.    He is currently in NSR. We discussed rhythm control options, and given his creatinine of 3.07, his choices are limited. Mainly, his option for AAD therapy would be amiodarone. We discussed benefits vs risks of this medication with its possible long term organ effects. After discussion, patient and wife appear they would like to discuss with nephrologist on appointment Monday.  Secondary Hypercoagulable State (ICD10:   D68.69) The patient is at significant risk for stroke/thromboembolism based upon his CHA2DS2-VASc Score of 7.  Continue Rivaroxaban (Xarelto).  Continue Xarelto 15 mg daily without interruption. Dose is appropriate based on CrCl 30 mL/min. Stop ASA.   Patient will call clinic if decides to proceed with amiodarone therapy.   Lake Bells, PA-C  Afib Clinic [facility] [address] [place], [place] [address] [phone]

## 2023-09-18 DIAGNOSIS — N184 Chronic kidney disease, stage 4 (severe): Secondary | ICD-10-CM | POA: Diagnosis not present

## 2023-09-18 DIAGNOSIS — D6489 Other specified anemias: Secondary | ICD-10-CM | POA: Diagnosis not present

## 2023-09-18 DIAGNOSIS — R309 Painful micturition, unspecified: Secondary | ICD-10-CM | POA: Diagnosis not present

## 2023-09-18 DIAGNOSIS — E211 Secondary hyperparathyroidism, not elsewhere classified: Secondary | ICD-10-CM | POA: Diagnosis not present

## 2023-09-18 DIAGNOSIS — E1169 Type 2 diabetes mellitus with other specified complication: Secondary | ICD-10-CM | POA: Diagnosis not present

## 2023-09-18 DIAGNOSIS — R809 Proteinuria, unspecified: Secondary | ICD-10-CM | POA: Diagnosis not present

## 2023-09-18 DIAGNOSIS — M1000 Idiopathic gout, unspecified site: Secondary | ICD-10-CM | POA: Diagnosis not present

## 2023-09-18 DIAGNOSIS — I10 Essential (primary) hypertension: Secondary | ICD-10-CM | POA: Diagnosis not present

## 2023-09-18 DIAGNOSIS — M109 Gout, unspecified: Secondary | ICD-10-CM | POA: Diagnosis not present

## 2023-09-18 DIAGNOSIS — D520 Dietary folate deficiency anemia: Secondary | ICD-10-CM | POA: Diagnosis not present

## 2023-09-18 DIAGNOSIS — E559 Vitamin D deficiency, unspecified: Secondary | ICD-10-CM | POA: Diagnosis not present

## 2023-09-19 ENCOUNTER — Ambulatory Visit
Admission: RE | Admit: 2023-09-19 | Discharge: 2023-09-19 | Disposition: A | Discharge status: Home / Self Care | Payer: Medicare Other | Source: Ambulatory Visit | Attending: Adult Health | Admitting: Adult Health

## 2023-09-19 DIAGNOSIS — R0609 Other forms of dyspnea: Secondary | ICD-10-CM | POA: Diagnosis not present

## 2023-09-19 DIAGNOSIS — I2510 Atherosclerotic heart disease of native coronary artery without angina pectoris: Secondary | ICD-10-CM | POA: Diagnosis not present

## 2023-09-19 DIAGNOSIS — R599 Enlarged lymph nodes, unspecified: Secondary | ICD-10-CM | POA: Diagnosis not present

## 2023-09-19 DIAGNOSIS — I517 Cardiomegaly: Secondary | ICD-10-CM | POA: Diagnosis not present

## 2023-09-19 DIAGNOSIS — R059 Cough, unspecified: Secondary | ICD-10-CM

## 2023-09-20 ENCOUNTER — Ambulatory Visit: Payer: Medicare Other | Admitting: Adult Health

## 2023-09-20 ENCOUNTER — Encounter: Payer: Self-pay | Admitting: Adult Health

## 2023-09-20 VITALS — BP 120/60 | HR 52 | Temp 98.5°F | Ht 74.5 in | Wt 224.0 lb

## 2023-09-20 DIAGNOSIS — Z794 Long term (current) use of insulin: Secondary | ICD-10-CM

## 2023-09-20 DIAGNOSIS — I4891 Unspecified atrial fibrillation: Secondary | ICD-10-CM | POA: Diagnosis not present

## 2023-09-20 DIAGNOSIS — I129 Hypertensive chronic kidney disease with stage 1 through stage 4 chronic kidney disease, or unspecified chronic kidney disease: Secondary | ICD-10-CM | POA: Diagnosis not present

## 2023-09-20 DIAGNOSIS — Z7984 Long term (current) use of oral hypoglycemic drugs: Secondary | ICD-10-CM | POA: Diagnosis not present

## 2023-09-20 DIAGNOSIS — E119 Type 2 diabetes mellitus without complications: Secondary | ICD-10-CM | POA: Diagnosis not present

## 2023-09-20 LAB — POCT GLYCOSYLATED HEMOGLOBIN (HGB A1C): Hemoglobin A1C: 6.9 % — AB (ref 4.0–5.6)

## 2023-09-20 NOTE — Progress Notes (Signed)
 Subjective:    Patient ID: [name] [name], male    DOB: 1946/06/30, 78 y.o.   MRN: [id number]  HPI 78 year old male who  has a past medical history of Anemia, Cataract, CKD (chronic kidney disease) stage 4, GFR 15-29 ml/min (HCC), DIABETES MELLITUS, TYPE II (01/02/2007), HYPERLIPIDEMIA (06/18/2007), HYPERTENSION (01/02/2007), RENAL INSUFFICIENCY (02/05/2008), and Stroke (HCC) (2019).  Diabetes mellitus type 2-managed with Starlix 60 mg daily and Lantus 9 units in the morning.  Lab Results  Component Value Date   HGBA1C 7.1 (A) 06/20/2023   HGBA1C 6.9 (H) 03/21/2023   HGBA1C 6.7 (A) 11/03/2022   Hypertension with chronic kidney disease stage IV-managed by nephrology.  He is currently prescribed hydralazine 50 mg 3 times daily, Norvasc 5 mg daily, valsartan 160 mg daily, and labetalol 200 mg 3 times daily.  He does check his blood pressure at home with readings and they report readings in the 170's in the mornings and 120's in the evening.  He was last seen by Nephrology  two days ago at this time his Cr was 2.79 and GFR 23.   BP Readings from Last 3 Encounters:  09/20/23 120/60  09/14/23 (!) 160/82  08/09/23 130/60    Review of Systems See HPI   Past Medical History:  Diagnosis Date   Anemia    Cataract    CKD (chronic kidney disease) stage 4, GFR 15-29 ml/min (HCC)    DIABETES MELLITUS, TYPE II 01/02/2007   HYPERLIPIDEMIA 06/18/2007   HYPERTENSION 01/02/2007   RENAL INSUFFICIENCY 02/05/2008   Stroke (HCC) 2019    Social History   Socioeconomic History   Marital status: Married    Spouse name: Not on file   Number of children: Not on file   Years of education: Not on file   Highest education level: Not on file  Occupational History   Not on file  Tobacco Use   Smoking status: Never   Smokeless tobacco: Never  Vaping Use   Vaping status: Never Used  Substance and Sexual Activity   Alcohol use: No   Drug use: No   Sexual activity: Not on file  Other Topics  Concern   Not on file  Social History Narrative   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 Likes to go to football games.    Social Drivers of Corporate investment banker Strain: Low Risk  (08/25/2023)   Overall Financial Resource Strain (CARDIA)    Difficulty of Paying Living Expenses: Not hard at all  Food Insecurity: No Food Insecurity (08/25/2023)   Hunger Vital Sign    Worried About Running Out of Food in the Last Year: Never true    Ran Out of Food in the Last Year: Never true  Transportation Needs: No Transportation Needs (08/25/2023)   PRAPARE - Administrator, Civil Service (Medical): No    Lack of Transportation (Non-Medical): No  Physical Activity: Inactive (08/25/2023)   Exercise Vital Sign    Days of Exercise per Week: 0 days    Minutes of Exercise per Session: 0 min  Stress: No Stress Concern Present (08/25/2023)   [employer] of Occupational Health - Occupational Stress Questionnaire    Feeling of Stress : Not at all  Social Connections: Socially Integrated (08/25/2023)   Social Connection and Isolation Panel [NHANES]  Frequency of Communication with Friends and Family: More than three times a week    Frequency of Social Gatherings with Friends and Family: More than three times a week    Attends Religious Services: More than 4 times per year    Active Member of Clubs or Organizations: Yes    Attends Banker Meetings: More than 4 times per year    Marital Status: Married  Catering manager Violence: Not At Risk (08/25/2023)   Humiliation, Afraid, Rape, and Kick questionnaire    Fear of Current or Ex-Partner: No    Emotionally Abused: No    Physically Abused: No    Sexually Abused: No    Past Surgical History:  Procedure Laterality Date   LOOP RECORDER INSERTION N/A 07/11/2017   Procedure: LOOP RECORDER INSERTION;  Surgeon:  Hillis Range, MD;  Location: [facility];  Service: Cardiovascular;  Laterality: N/A;   SHOULDER SURGERY     left   TEE WITHOUT CARDIOVERSION N/A 07/11/2017   Procedure: TRANSESOPHAGEAL ECHOCARDIOGRAM (TEE);  Surgeon: Lars Masson, MD;  Location: [facility] ENDOSCOPY;  Service: Cardiovascular;  Laterality: N/A;   TRANSURETHRAL RESECTION OF PROSTATE     history of retention/hematuria     Family History  Problem Relation Age of Onset   Hypertension Mother    Diabetes Mother    Stroke Father    Stroke Maternal Grandmother    Colon cancer Neg Hx    Esophageal cancer Neg Hx    Rectal cancer Neg Hx    Stomach cancer Neg Hx     Allergies  Allergen Reactions   Evolocumab Hypertension    Other reaction(s): Other (see comments)   5-Alpha Reductase Inhibitors Other (See Comments)    myalgia Other reaction(s): Other (see comments)   Bactrim [Sulfamethoxazole-Trimethoprim] Hives, Itching and Swelling   Penicillins Hives    Has patient had a PCN reaction causing immediate rash, facial/tongue/throat swelling, SOB or lightheadedness with hypotension: Unk Has patient had a PCN reaction causing severe rash involving mucus membranes or skin necrosis: Unk Has patient had a PCN reaction that required hospitalization: Unk Has patient had a PCN reaction occurring within the last 10 years: No If all of the above answers are "NO", then may proceed with Cephalosporin use. Other reaction(s): Other (see comments)   Statins     myalgia Other reaction(s): Other (see comments)   Sulfa Antibiotics     Other reaction(s): Other (see comments)   Sulfa Drugs Cross Reactors Hives, Itching and Swelling   Sulfamethoxazole-Trimethoprim Hives and Itching    Other reaction(s): Other (see comments)    Current Outpatient Medications on File Prior to Visit  Medication Sig Dispense Refill   amLODipine (NORVASC) 5 MG tablet Take 5 mg by mouth in the morning and at bedtime.     B Complex Vitamins (VITAMIN B  COMPLEX) TABS Take 1 tablet by mouth every morning.     Blood Glucose Monitoring Suppl (ONETOUCH VERIO) w/Device KIT Use with existing test strips 1 kit kit   calcitRIOL (ROCALTROL) 0.25 MCG capsule Take 1 capsule by mouth every other day.     Cholecalciferol 50 MCG (2000 UT) CAPS Take 1 capsule by mouth daily.     ezetimibe (ZETIA) 10 MG tablet TAKE 1 TABLET(10 MG) BY MOUTH DAILY 90 tablet 3   ferrous sulfate 325 (65 FE) MG tablet Take 325 mg by mouth 2 (two) times daily with a meal.     hydrALAZINE (APRESOLINE) 100 MG tablet Take 100 mg  by mouth 3 (three) times daily.     labetalol (NORMODYNE) 200 MG tablet TAKE 1 TABLET(200 MG) BY MOUTH THREE TIMES DAILY 270 tablet 3   LANTUS SOLOSTAR 100 UNIT/ML Solostar Pen ADMINISTER 9 UNITS UNDER THE SKIN TWICE DAILY (Patient taking differently: 9 Units daily. Takes 9 units in AM) 15 mL 0   latanoprost (XALATAN) 0.005 % ophthalmic solution 1 drop at bedtime.     nateglinide (STARLIX) 60 MG tablet TAKE 1 TABLET BY MOUTH ONCE A DAY WITH MEALS     ONETOUCH VERIO test strip USE TO TEST TWICE DAILY AS DIRECTED 200 strip 2   Rivaroxaban (XARELTO) 15 MG TABS tablet Take 1 tablet (15 mg total) by mouth daily with supper. 30 tablet 3   timolol (TIMOPTIC) 0.5 % ophthalmic solution 1 drop 2 (two) times daily.     valsartan (DIOVAN) 160 MG tablet Take 80 mg by mouth 2 (two) times daily.     No current facility-administered medications on file prior to visit.    BP 120/60   Pulse (!) 52   Temp 98.5 F (36.9 C) (Oral)   Ht 6' 2.5" (1.892 m)   Wt 224 lb (101.6 kg)   SpO2 97%   BMI 28.38 kg/m       Objective:   Physical Exam Vitals and nursing note reviewed.  Constitutional:      Appearance: Normal appearance. He is obese.  Cardiovascular:     Rate and Rhythm: Normal rate and regular rhythm.     Pulses: Normal pulses.     Heart sounds: Normal heart sounds.  Pulmonary:     Effort: Pulmonary effort is normal.     Breath sounds: Normal breath sounds.   Skin:    General: Skin is warm and dry.  Neurological:     General: No focal deficit present.     Mental Status: He is alert and oriented to person, place, and time.  Psychiatric:        Mood and Affect: Mood normal.        Behavior: Behavior normal.        Thought Content: Thought content normal.        Judgment: Judgment normal.       Assessment & Plan:  1. Diabetes mellitus treated with oral medication (HCC) (Primary)  - POC HgB A1c- 6.9  - Continue with Starlix 60 mg daily  - Follow up in 3 months   2. Current use of insulin (HCC) - Continue with current dose of lantus   3. Hypertensive kidney disease - At goal today. Continue to monitor.    4. Atrial fibrillation, unspecified type (HCC) Samples of this drug were given to the patient, quantity 2, Lot Number [id number]   The patient has been instructed regarding the correct time, dose, and frequency of taking this medication, including desired effects and most common side effects.   Shirline Frees 7:31 AM 09/20/2023

## 2023-09-20 NOTE — Patient Instructions (Signed)
 Health Maintenance Due  Topic Date Due   Zoster Vaccines- Shingrix (1 of 2) Never done   DTaP/Tdap/Td (2 - Tdap) 11/14/2019   COVID-19 Vaccine (5 - 2024-25 season) 03/05/2023       08/25/2023    3:11 PM 09/16/2022    4:08 PM 08/17/2022   10:06 AM  Depression screen PHQ 2/9  Decreased Interest 0 0 0  Down, Depressed, Hopeless 0 0 0  PHQ - 2 Score 0 0 0  Altered sleeping  3   Tired, decreased energy  1   Change in appetite  0   Feeling bad or failure about yourself   0   Trouble concentrating  0   Moving slowly or fidgety/restless  0   Suicidal thoughts  0   PHQ-9 Score  4   Difficult doing work/chores  Not difficult at all

## 2023-09-28 ENCOUNTER — Telehealth (HOSPITAL_COMMUNITY): Payer: Self-pay | Admitting: *Deleted

## 2023-09-28 MED ORDER — AMIODARONE HCL 200 MG PO TABS
ORAL_TABLET | ORAL | 1 refills | Status: DC
Start: 2023-09-28 — End: 2023-11-24

## 2023-09-28 NOTE — Telephone Encounter (Signed)
 Patient daughter stated patient would like to proceed with amiodarone start. Per Landry Mellow PA will start Amiodarone 200mg  twice a day for 14 days then reduce to once a day. Follow up 1 month.

## 2023-10-04 ENCOUNTER — Other Ambulatory Visit: Payer: Self-pay | Admitting: Adult Health

## 2023-10-04 ENCOUNTER — Telehealth: Payer: Self-pay

## 2023-10-04 DIAGNOSIS — I4891 Unspecified atrial fibrillation: Secondary | ICD-10-CM

## 2023-10-04 NOTE — Telephone Encounter (Signed)
 Copied from CRM [id number]. Topic: Clinical - Medical Advice >> Oct 04, 2023 11:52 AM Melissa C wrote: Reason for CRM: patient needs lab orders switched from [facility] to [facility] for Rivaroxaban (XARELTO) 15 MG TABS tablet. I was going to go ahead and schedule an appointment but wanted to make sure orders were switched over first, therefore once patient has orders he also needs a lab appointment for [facility] lab. Please advise with wife [place].

## 2023-10-04 NOTE — Telephone Encounter (Signed)
 Do you know anything about labs?

## 2023-10-05 ENCOUNTER — Other Ambulatory Visit

## 2023-10-05 ENCOUNTER — Ambulatory Visit (INDEPENDENT_AMBULATORY_CARE_PROVIDER_SITE_OTHER): Admitting: Adult Health

## 2023-10-05 VITALS — BP 120/60 | HR 105 | Temp 98.5°F | Ht 74.5 in | Wt 224.0 lb

## 2023-10-05 DIAGNOSIS — I4891 Unspecified atrial fibrillation: Secondary | ICD-10-CM

## 2023-10-05 DIAGNOSIS — R195 Other fecal abnormalities: Secondary | ICD-10-CM | POA: Diagnosis not present

## 2023-10-05 DIAGNOSIS — R5382 Chronic fatigue, unspecified: Secondary | ICD-10-CM

## 2023-10-05 NOTE — Telephone Encounter (Signed)
 Pt has been scheduled. Per pt spouse pt is having sx of diarrhea, chills, hot flashes and headache. She think this may be coming from the Xarelto. Pt has been scheduled.

## 2023-10-05 NOTE — Progress Notes (Signed)
 Subjective:    Patient ID: [name] [name], male    DOB: 08-May-1946, 78 y.o.   MRN: [id number]  HPI 78 year old male who  has a past medical history of Anemia, Cataract, CKD (chronic kidney disease) stage 4, GFR 15-29 ml/min (HCC), DIABETES MELLITUS, TYPE II (01/02/2007), HYPERLIPIDEMIA (06/18/2007), HYPERTENSION (01/02/2007), RENAL INSUFFICIENCY (02/05/2008), and Stroke (HCC) (2019).  He presents to the office today with his wife for worsening fatigue and decreased appetite.  He reports that his symptoms have been present for the last month has been progressively getting worse.  He feels sleepy all the time and does not necessarily want to get out of bed.  His wife reports that he has been going to bed sooner.  At times he feels off balance because of the fatigue.  He also reports having some mild loose stools but contributes this to drinking milk and his usually does not handle milk very well.  He and his wife are wondering if starting on Xarelto has caused an increase in fatigue and loss of appetite.  He was started on Xarelto about 3 to 4 weeks ago due to new A-fib, was seen by A-fib clinic and prescribed amiodarone which she has not started yet.   Review of Systems See HPI   Past Medical History:  Diagnosis Date   Anemia    Cataract    CKD (chronic kidney disease) stage 4, GFR 15-29 ml/min (HCC)    DIABETES MELLITUS, TYPE II 01/02/2007   HYPERLIPIDEMIA 06/18/2007   HYPERTENSION 01/02/2007   RENAL INSUFFICIENCY 02/05/2008   Stroke (HCC) 2019    Social History   Socioeconomic History   Marital status: Married    Spouse name: Not on file   Number of children: Not on file   Years of education: Not on file   Highest education level: Not on file  Occupational History   Not on file  Tobacco Use   Smoking status: Never   Smokeless tobacco: Never  Vaping Use   Vaping status: Never Used  Substance and Sexual Activity   Alcohol use: No   Drug use: No   Sexual activity: Not  on file  Other Topics Concern   Not on file  Social History Narrative   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 Likes to go to football games.    Social Drivers of Corporate investment banker Strain: Low Risk  (08/25/2023)   Overall Financial Resource Strain (CARDIA)    Difficulty of Paying Living Expenses: Not hard at all  Food Insecurity: No Food Insecurity (08/25/2023)   Hunger Vital Sign    Worried About Running Out of Food in the Last Year: Never true    Ran Out of Food in the Last Year: Never true  Transportation Needs: No Transportation Needs (08/25/2023)   PRAPARE - Administrator, Civil Service (Medical): No    Lack of Transportation (Non-Medical): No  Physical Activity: Inactive (08/25/2023)   Exercise Vital Sign    Days of Exercise per Week: 0 days    Minutes of Exercise per Session: 0 min  Stress: No Stress Concern Present (08/25/2023)   [employer] of Occupational Health - Occupational Stress Questionnaire    Feeling of Stress : Not at all  Social Connections: Socially Integrated (08/25/2023)   Social Connection  and Isolation Panel [NHANES]    Frequency of Communication with Friends and Family: More than three times a week    Frequency of Social Gatherings with Friends and Family: More than three times a week    Attends Religious Services: More than 4 times per year    Active Member of Clubs or Organizations: Yes    Attends Banker Meetings: More than 4 times per year    Marital Status: Married  Catering manager Violence: Not At Risk (08/25/2023)   Humiliation, Afraid, Rape, and Kick questionnaire    Fear of Current or Ex-Partner: No    Emotionally Abused: No    Physically Abused: No    Sexually Abused: No    Past Surgical History:  Procedure Laterality Date   LOOP RECORDER INSERTION N/A 07/11/2017   Procedure: LOOP RECORDER  INSERTION;  Surgeon: Hillis Range, MD;  Location: [facility] INVASIVE [facility] LAB;  Service: Cardiovascular;  Laterality: N/A;   SHOULDER SURGERY     left   TEE WITHOUT CARDIOVERSION N/A 07/11/2017   Procedure: TRANSESOPHAGEAL ECHOCARDIOGRAM (TEE);  Surgeon: Lars Masson, MD;  Location: [facility] ENDOSCOPY;  Service: Cardiovascular;  Laterality: N/A;   TRANSURETHRAL RESECTION OF PROSTATE     history of retention/hematuria     Family History  Problem Relation Age of Onset   Hypertension Mother    Diabetes Mother    Stroke Father    Stroke Maternal Grandmother    Colon cancer Neg Hx    Esophageal cancer Neg Hx    Rectal cancer Neg Hx    Stomach cancer Neg Hx     Allergies  Allergen Reactions   Evolocumab Hypertension    Other reaction(s): Other (see comments)   5-Alpha Reductase Inhibitors Other (See Comments)    myalgia Other reaction(s): Other (see comments)   Bactrim [Sulfamethoxazole-Trimethoprim] Hives, Itching and Swelling   Penicillins Hives    Has patient had a PCN reaction causing immediate rash, facial/tongue/throat swelling, SOB or lightheadedness with hypotension: Unk Has patient had a PCN reaction causing severe rash involving mucus membranes or skin necrosis: Unk Has patient had a PCN reaction that required hospitalization: Unk Has patient had a PCN reaction occurring within the last 10 years: No If all of the above answers are "NO", then may proceed with Cephalosporin use. Other reaction(s): Other (see comments)   Statins     myalgia Other reaction(s): Other (see comments)   Sulfa Antibiotics     Other reaction(s): Other (see comments)   Sulfa Drugs Cross Reactors Hives, Itching and Swelling   Sulfamethoxazole-Trimethoprim Hives and Itching    Other reaction(s): Other (see comments)    Current Outpatient Medications on File Prior to Visit  Medication Sig Dispense Refill   amLODipine (NORVASC) 5 MG tablet Take 5 mg by mouth in the morning and at bedtime.     B Complex  Vitamins (VITAMIN B COMPLEX) TABS Take 1 tablet by mouth every morning.     Blood Glucose Monitoring Suppl (ONETOUCH VERIO) w/Device KIT Use with existing test strips 1 kit kit   calcitRIOL (ROCALTROL) 0.25 MCG capsule Take 1 capsule by mouth every other day.     Cholecalciferol 50 MCG (2000 UT) CAPS Take 1 capsule by mouth daily.     ezetimibe (ZETIA) 10 MG tablet TAKE 1 TABLET(10 MG) BY MOUTH DAILY 90 tablet 3   ferrous sulfate 325 (65 FE) MG tablet Take 325 mg by mouth 2 (two) times daily with a meal.     hydrALAZINE (  APRESOLINE) 100 MG tablet Take 100 mg by mouth 3 (three) times daily.     labetalol (NORMODYNE) 200 MG tablet TAKE 1 TABLET(200 MG) BY MOUTH THREE TIMES DAILY 270 tablet 3   LANTUS SOLOSTAR 100 UNIT/ML Solostar Pen ADMINISTER 9 UNITS UNDER THE SKIN TWICE DAILY (Patient taking differently: 9 Units daily. Takes 9 units in AM) 15 mL 0   latanoprost (XALATAN) 0.005 % ophthalmic solution 1 drop at bedtime.     nateglinide (STARLIX) 60 MG tablet TAKE 1 TABLET BY MOUTH ONCE A DAY WITH MEALS     ONETOUCH VERIO test strip USE TO TEST TWICE DAILY AS DIRECTED 200 strip 2   Rivaroxaban (XARELTO) 15 MG TABS tablet Take 1 tablet (15 mg total) by mouth daily with supper. 30 tablet 3   timolol (TIMOPTIC) 0.5 % ophthalmic solution 1 drop 2 (two) times daily.     valsartan (DIOVAN) 160 MG tablet Take 80 mg by mouth 2 (two) times daily.     amiodarone (PACERONE) 200 MG tablet Take 1 tablet (200 mg total) by mouth 2 (two) times daily for 14 days, THEN 1 tablet (200 mg total) daily. (Patient not taking: Reported on 10/05/2023) 90 tablet 1   No current facility-administered medications on file prior to visit.    BP 120/60 (BP Location: Right Arm, Patient Position: Sitting, Cuff Size: Normal)   Pulse 74   Temp 98.5 F (36.9 C) (Oral)   Ht 6' 2.5" (1.892 m)   Wt 224 lb (101.6 kg)   SpO2 98%   BMI 28.38 kg/m       Objective:   Physical Exam Constitutional:      Appearance: Normal appearance.   Cardiovascular:     Rate and Rhythm: Normal rate. Rhythm irregularly irregular.     Pulses: Normal pulses.     Heart sounds: Normal heart sounds.  Pulmonary:     Effort: Pulmonary effort is normal.     Breath sounds: Normal breath sounds.  Skin:    General: Skin is warm and dry.  Neurological:     General: No focal deficit present.     Mental Status: He is alert and oriented to person, place, and time.  Psychiatric:        Mood and Affect: Mood normal.        Behavior: Behavior normal.        Thought Content: Thought content normal.        Judgment: Judgment normal.        Assessment & Plan:  1. Atrial fibrillation, unspecified type (HCC) (Primary) - Will have him start Amiodorone per instructions by cardiology clinic to see if this helps his fatigue  - CBC; Future - Comprehensive metabolic panel with GFR; Future - Comprehensive metabolic panel with GFR - CBC - CBC  2. Chronic fatigue  - CBC; Future - Comprehensive metabolic panel with GFR; Future - Comprehensive metabolic panel with GFR - CBC - CBC  3. Loose stools - Encouraged almond milk or lactose free milk. Doubt loose stools are coming from Xarelto.  - CBC; Future - Comprehensive metabolic panel with GFR; Future - Comprehensive metabolic panel with GFR - CBC - CBC  Shirline Frees, NP

## 2023-10-06 LAB — COMPREHENSIVE METABOLIC PANEL WITH GFR
ALT: 9 U/L (ref 0–53)
AST: 9 U/L (ref 0–37)
Albumin: 4.4 g/dL (ref 3.5–5.2)
Alkaline Phosphatase: 59 U/L (ref 39–117)
BUN: 39 mg/dL — ABNORMAL HIGH (ref 6–23)
CO2: 23 meq/L (ref 19–32)
Calcium: 9.5 mg/dL (ref 8.4–10.5)
Chloride: 107 meq/L (ref 96–112)
Creatinine, Ser: 3.08 mg/dL — ABNORMAL HIGH (ref 0.40–1.50)
GFR: 18.91 mL/min — ABNORMAL LOW (ref >60.00)
Glucose, Bld: 180 mg/dL — ABNORMAL HIGH (ref 70–99)
Potassium: 4.6 meq/L (ref 3.5–5.1)
Sodium: 139 meq/L (ref 135–145)
Total Bilirubin: 0.4 mg/dL (ref 0.2–1.2)
Total Protein: 6.9 g/dL (ref 6.0–8.3)

## 2023-10-10 ENCOUNTER — Encounter: Payer: Self-pay | Admitting: Adult Health

## 2023-10-10 ENCOUNTER — Other Ambulatory Visit

## 2023-10-10 ENCOUNTER — Other Ambulatory Visit: Payer: Self-pay | Admitting: Adult Health

## 2023-10-10 DIAGNOSIS — I480 Paroxysmal atrial fibrillation: Secondary | ICD-10-CM

## 2023-10-10 DIAGNOSIS — R590 Localized enlarged lymph nodes: Secondary | ICD-10-CM

## 2023-10-11 ENCOUNTER — Telehealth: Payer: Self-pay | Admitting: *Deleted

## 2023-10-11 ENCOUNTER — Other Ambulatory Visit: Payer: Self-pay | Admitting: Adult Health

## 2023-10-11 ENCOUNTER — Encounter: Payer: Self-pay | Admitting: *Deleted

## 2023-10-11 DIAGNOSIS — E1122 Type 2 diabetes mellitus with diabetic chronic kidney disease: Secondary | ICD-10-CM

## 2023-10-11 LAB — CBC
Hematocrit: 37.2 % — ABNORMAL LOW (ref 37.5–51.0)
Hemoglobin: 12 g/dL — ABNORMAL LOW (ref 13.0–17.7)
MCH: 27.3 pg (ref 26.6–33.0)
MCHC: 32.3 g/dL (ref 31.5–35.7)
MCV: 85 fL (ref 79–97)
Platelets: 272 10*3/uL (ref 150–450)
RBC: 4.39 x10E6/uL (ref 4.14–5.80)
RDW: 14.7 % (ref 11.6–15.4)
WBC: 7.1 10*3/uL (ref 3.4–10.8)

## 2023-10-11 LAB — SPECIMEN STATUS REPORT

## 2023-10-11 NOTE — Telephone Encounter (Signed)
 Copied from CRM [id number]. Topic: Clinical - Lab/Test Results >> Oct 11, 2023  8:47 AM Renie Ora wrote: Reason for CRM: Patient wife [name] returning a phone call from [facility] regarding test results for patient. Upon checking MyChart, no recent message left regarding results for patient. Please follow up with [place].     CT chest showed some mild interstitial edema consistent with chronic congestive heart failure.  Very minimum atherosclerosis.  There was some enlarged lymph nodes that we will need ongoing monitoring.  Would recommend a repeat CT chest in 3 months for evaluation. Will need office visit after this to discuss in detail.  This can be set up with myself or Dr. Delton Coombes (30 min slot) Called to discuss results with patient unable to speak to patient.  Message was left on voicemail to return call for results   Spoke with the pt's spouse ok per DPR and notified of results Spoke with the pt and scheduled appt with RB for 11/22/22.

## 2023-10-11 NOTE — Progress Notes (Signed)
 ATC x1.  Left detailed message per DPR to return call.  Mychart messge sent.

## 2023-10-12 ENCOUNTER — Telehealth: Payer: Self-pay

## 2023-10-12 NOTE — Telephone Encounter (Signed)
 Copied from CRM [id number]. Topic: Clinical - Lab/Test Results >> Oct 12, 2023 12:31 PM Theodis Sato wrote: Reason for CRM: Please call patients wife [name] back so that she may have a better understanding of the patients most recent imaging results.  I called and spoke with [place], pts wife. (DPR) [place] informed of Tammys note and is scheduled for May 2025. [name] verbalized understanding. nfn

## 2023-11-01 NOTE — Progress Notes (Signed)
 Cardiology Office Note:    Date:  11/06/2023   ID:  [name] [name], DOB 27-Aug-1945, MRN [id number]  PCP:  [name], NP    [facility]Care Providers Cardiologist:  Doil Kamara [place], MD     Referring MD: [name], NP   Chief Complaint  Patient presents with   Atrial Fibrillation    History of Present Illness:    [name] [name] is a 78 y.o. male who is seen for follow up evaluation of dyspnea on exertion. He has a history of DM type 2, HTN, HLD and CKD. Prior CVA in 2019. Had ILR placed in 2019 with no arrhythmias noted to date. Echo and TEE in 2019 were normal. He has moderate carotid arterial disease followed by Vascular surgery in [place].  When seen in Dec  he complained of symptoms of SOB with exertion.  To evaluate further we performed a Myoview  study and Echo with results noted below. He has mild LV dysfunction with EF 40-45% and no significant perfusion abnormality.  Since I last saw him in October he noted symptoms of palpitations. Was seen by PCP and event monitor placed showing Afib with burden of 12%. Symptoms correlated with Afib. He was started on Xarelto . Was seen in Afib clinic. Given CKD only antiarrhythmic option would be amiodarone . He did see his primary care on April 3 and was started on amiodarone  at that time. Started 400 mg daily for 2 weeks then 200 mg daily. He did have a sleep study showing no significant sleep apnea.   On follow up today he states he is not doing well. Still notes heart beating abnormal with rates 110-120. Feels exhausted and still SOB. Finds pulse is actually faster since on amiodarone . Weight has been stable and no edema. He is careful with his salt intake. BP is better. No chest pain.  Notes appetite is "different".   Past Medical History:  Diagnosis Date   Anemia    Cataract    CKD (chronic kidney disease) stage 4, GFR 15-29 ml/min (HCC)    DIABETES MELLITUS, TYPE II 01/02/2007   HYPERLIPIDEMIA 06/18/2007    HYPERTENSION 01/02/2007   RENAL INSUFFICIENCY 02/05/2008   Stroke (HCC) 2019    Past Surgical History:  Procedure Laterality Date   LOOP RECORDER INSERTION N/A 07/11/2017   Procedure: LOOP RECORDER INSERTION;  Surgeon: [name], MD;  Location: [facility] INVASIVE CV LAB;  Service: Cardiovascular;  Laterality: N/A;   SHOULDER SURGERY     left   TEE WITHOUT CARDIOVERSION N/A 07/11/2017   Procedure: TRANSESOPHAGEAL ECHOCARDIOGRAM (TEE);  Surgeon: [name], MD;  Location: [facility] ENDOSCOPY;  Service: Cardiovascular;  Laterality: N/A;   TRANSURETHRAL RESECTION OF PROSTATE     history of retention/hematuria     Current Medications: Current Meds  Medication Sig   amiodarone  (PACERONE ) 200 MG tablet Take 1 tablet (200 mg total) by mouth 2 (two) times daily for 14 days, THEN 1 tablet (200 mg total) daily.   amLODipine  (NORVASC ) 5 MG tablet Take 5 mg by mouth in the morning and at bedtime.   B Complex Vitamins (VITAMIN B COMPLEX) TABS Take 1 tablet by mouth every morning.   Blood Glucose Monitoring Suppl (ONETOUCH VERIO) w/Device KIT Use with existing test strips   calcitRIOL  (ROCALTROL ) 0.25 MCG capsule Take 1 capsule by mouth every other day.   Cholecalciferol  50 MCG (2000 UT) CAPS Take 1 capsule by mouth daily.   ezetimibe  (ZETIA ) 10 MG tablet TAKE 1 TABLET(10 MG) BY MOUTH  DAILY   ferrous sulfate 325 (65 FE) MG tablet Take 325 mg by mouth 2 (two) times daily with a meal.   hydrALAZINE  (APRESOLINE ) 100 MG tablet Take 100 mg by mouth 3 (three) times daily.   labetalol  (NORMODYNE ) 200 MG tablet TAKE 1 TABLET(200 MG) BY MOUTH THREE TIMES DAILY   LANTUS  SOLOSTAR 100 UNIT/ML Solostar Pen ADMINISTER 9 UNITS UNDER THE SKIN TWICE DAILY (Patient taking differently: 9 Units daily.)   latanoprost (XALATAN) 0.005 % ophthalmic solution 1 drop at bedtime.   nateglinide  (STARLIX ) 60 MG tablet TAKE 1 TABLET BY MOUTH ONCE A DAY WITH MEALS   ONETOUCH VERIO test strip USE TO TEST TWICE DAILY AS DIRECTED    Rivaroxaban  (XARELTO ) 15 MG TABS tablet Take 1 tablet (15 mg total) by mouth daily with supper.   timolol (TIMOPTIC) 0.5 % ophthalmic solution 1 drop 2 (two) times daily.   valsartan (DIOVAN) 160 MG tablet Take 80 mg by mouth 2 (two) times daily.   valsartan (DIOVAN) 160 MG tablet Take 160 mg by mouth daily.     Allergies:   Evolocumab , 5-alpha reductase inhibitors, Bactrim [sulfamethoxazole-trimethoprim], Penicillins, Statins, Sulfa antibiotics, Sulfa drugs cross reactors, and Sulfamethoxazole-trimethoprim   Social History   Socioeconomic History   Marital status: Married    Spouse name: Not on file   Number of children: Not on file   Years of education: Not on file   Highest education level: Not on file  Occupational History   Not on file  Tobacco Use   Smoking status: Never   Smokeless tobacco: Never  Vaping Use   Vaping status: Never Used  Substance and Sexual Activity   Alcohol use: No   Drug use: No   Sexual activity: Not on file  Other Topics Concern   Not on file  Social History Narrative   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 Likes to go to football games.    Social Drivers of Corporate investment banker Strain: Low Risk  (08/25/2023)   Overall Financial Resource Strain (CARDIA)    Difficulty of Paying Living Expenses: Not hard at all  Food Insecurity: No Food Insecurity (08/25/2023)   Hunger Vital Sign    Worried About Running Out of Food in the Last Year: Never true    Ran Out of Food in the Last Year: Never true  Transportation Needs: No Transportation Needs (08/25/2023)   PRAPARE - Administrator, Civil Service (Medical): No    Lack of Transportation (Non-Medical): No  Physical Activity: Inactive (08/25/2023)   Exercise Vital Sign    Days of Exercise per Week: 0 days    Minutes of Exercise per Session: 0 min  Stress: No Stress  Concern Present (08/25/2023)   [employer] of Occupational Health - Occupational Stress Questionnaire    Feeling of Stress : Not at all  Social Connections: Socially Integrated (08/25/2023)   Social Connection and Isolation Panel [NHANES]    Frequency of Communication with Friends and Family: More than three times a week    Frequency of Social Gatherings with Friends and Family: More than three times a week    Attends Religious Services: More than 4 times per year    Active Member of [employer] or Organizations: Yes    Attends Banker Meetings: More than 4 times  per year    Marital Status: Married     Family History: The patient's family history includes Diabetes in his mother; Hypertension in his mother; Stroke in his father and maternal grandmother. There is no history of Colon cancer, Esophageal cancer, Rectal cancer, or Stomach cancer.  ROS:   Please see the history of present illness.     All other systems reviewed and are negative.  EKGs/Labs/Other Studies Reviewed:    The following studies were reviewed today: Echo 07/09/17: Study Conclusions   - Left ventricle: The cavity size was mildly dilated. Wall    thickness was increased in a pattern of mild LVH. Systolic    function was normal. The estimated ejection fraction was in the    range of 55% to 60%. Wall motion was normal; there were no    regional wall motion abnormalities. Doppler parameters are    consistent with abnormal left ventricular relaxation (grade 1    diastolic dysfunction).  - Left atrium: The atrium was mildly dilated.   TEE 07/11/17: Study Conclusions   - Left ventricle: There was mild concentric hypertrophy. Systolic    function was normal. The estimated ejection fraction was in the    range of 55% to 60%. Wall motion was normal; there were no    regional wall motion abnormalities.  - Aorta: There was moderate non-mobile atheroma.  - Left atrium: The atrium was dilated. No evidence of  thrombus in    the atrial cavity or appendage. No evidence of thrombus in the    atrial cavity or appendage. The appendage was morphologically a    left appendage, multilobulated, and of normal size. Emptying    velocity was normal.  - Right atrium: No evidence of thrombus in the atrial cavity or    appendage.  - Atrial septum: No defect or patent foramen ovale was identified.  - Tricuspid valve: There was mild regurgitation.   Impressions:   - No cardiac source of emboli was indentified.   Echo 07/05/22: IMPRESSIONS     1. Left ventricular ejection fraction, by estimation, is 45 to 50%. Left  ventricular ejection fraction by 3D volume is 45 %. The left ventricle has  mildly decreased function. The left ventricle demonstrates global  hypokinesis. The left ventricular  internal cavity size was mildly dilated. Left ventricular diastolic  parameters are consistent with Grade II diastolic dysfunction  (pseudonormalization). Elevated left ventricular end-diastolic pressure.   2. Right ventricular systolic function is normal. The right ventricular  size is normal.   3. Left atrial size was severely dilated.   4. The mitral valve is normal in structure. Trivial mitral valve  regurgitation. No evidence of mitral stenosis.   5. The aortic valve is tricuspid. There is moderate calcification of the  aortic valve. There is moderate thickening of the aortic valve. Aortic  valve regurgitation is not visualized. Aortic valve  sclerosis/calcification is present, without any evidence  of aortic stenosis.   6. Aortic dilatation noted. There is mild dilatation of the ascending  aorta, measuring 38 mm.   7. The inferior vena cava is dilated in size with >50% respiratory  variability, suggesting right atrial pressure of 8 mmHg.   Myoview  06/09/22: Study Highlights      Findings are consistent with no prior ischemia. The study is intermediate risk.   No ST deviation was noted.   LV perfusion is  abnormal. There is no evidence of ischemia. There is no evidence of infarction. Defect 1: There  is a small defect with mild reduction in uptake present in the apical apex location(s) that is fixed. There is normal wall motion in the defect area. Consistent with artifact.   Left ventricular function is abnormal. Global function is mildly reduced. End diastolic cavity size is moderately enlarged. End systolic cavity size is moderately enlarged.   Prior study not available for comparison.   Small size, mild intensity fixed apical perfusion defect, c/w apical thinning artifact. No reversible ischemia. LVEF 47% with moderately dilated LV and global hypokinesis. This is an intermediate risk study. No prior study for comparison.      EKG Interpretation Date/Time:  Monday Nov 06 2023 08:38:53 EDT Ventricular Rate:  108 PR Interval:  264 QRS Duration:  122 QT Interval:  316 QTC Calculation: 423 R Axis:   70  Text Interpretation: Atrial flutter with 2 to 1 block Cannot rule out Anterior infarct (cited on or before 14-Sep-2023) ST & T wave abnormality, consider inferolateral ischemia When compared with ECG of 14-Sep-2023 08:58, Premature ventricular complexes are no longer Present atrial fllutter is new. T wave inversion now evident in Inferior leads Confirmed by [place], Shaiden Aldous [phone]) on 11/06/2023 8:40:41 AM    CHEST - 2 VIEW   COMPARISON:  January 25, 2019   FINDINGS: The heart size and mediastinal contours are stable. Both lungs are clear. The visualized skeletal structures are stable.   IMPRESSION: No active cardiopulmonary disease.     Electronically Signed   By: Anna Barnes M.D.   On: 06/04/2022 12:30   Event monitor 08/25/23: Study Highlights  NSR with sinus brady (50/min) and sinus tachy (120/min) ave HR 70/min. PAF (12%) with rates of 79-169/min.  No VT.  NS SVT, longest 20 beats at 121/min, likely atrial tachycardia Isolated PVC's (8.4%) and PAC's (1%).  No prolonged pauses. No  symptoms recorded. Patch Wear Time:  4 days and 17 hours (2025-02-10T21:41:07-498 to 2025-02-15T14:52:41-0500)  Recent Labs: 03/21/2023: TSH 1.98 08/02/2023: Magnesium 1.9 10/05/2023: ALT 9; BUN 39; Creatinine, Ser 3.08; Potassium 4.6; Sodium 139 10/10/2023: Hemoglobin 12.0; Platelets 272  Recent Lipid Panel    Component Value Date/Time   CHOL 170 03/21/2023 0835   TRIG 160.0 (H) 03/21/2023 0835   TRIG 323 (HH) 06/13/2006 1050   HDL 39.90 03/21/2023 0835   CHOLHDL 4 03/21/2023 0835   VLDL 32.0 03/21/2023 0835   LDLCALC 98 03/21/2023 0835   LDLCALC 98 01/15/2020 0811   LDLDIRECT 180.7 06/24/2013 1106   Labs dated 06/14/22: BUN 40, creatinine 2.98. GFR 22. Otherwise CMET normal. PTH 177. Phosphorus 3.3. UA normal.   Physical Exam:    VS:  BP (!) 138/90 (BP Location: Left Arm, Patient Position: Sitting, Cuff Size: Normal)   Pulse (!) 108   Ht 6\' 3"  (1.905 m)   Wt 225 lb (102.1 kg)   SpO2 97%   BMI 28.12 kg/m     Wt Readings from Last 3 Encounters:  11/06/23 225 lb (102.1 kg)  10/05/23 224 lb (101.6 kg)  09/20/23 224 lb (101.6 kg)     GEN:  Well nourished, well developed in no acute distress HEENT: Normal NECK: No JVD; No carotid bruits LYMPHATICS: No lymphadenopathy CARDIAC: RRR, no murmurs, rubs, gallops RESPIRATORY:  Clear to auscultation without rales, wheezing or rhonchi  ABDOMEN: Soft, non-tender, non-distended MUSCULOSKELETAL:  tr edema; No deformity  SKIN: Warm and dry NEUROLOGIC:  Alert and oriented x 3 PSYCHIATRIC:  Normal affect    CHA2DS2-VASc Score = 8   This indicates a 10.8%  annual risk of stroke. The patient's score is based upon: CHF History: 1 HTN History: 1 Diabetes History: 1 Stroke History: 2 Vascular Disease History: 1 Age Score: 2 Gender Score: 0      ASSESSMENT:    1. Paroxysmal atrial fibrillation (HCC)   2. DOE (dyspnea on exertion)   3. Hyperlipidemia LDL goal <70   4. Primary hypertension   5. CKD (chronic kidney disease)  stage 4, GFR 15-29 ml/min (HCC)   6. Typical atrial flutter (HCC)       PLAN:    In order of problems listed above:  Chronic combined systolic/diastolic CHF.  He does have mild LV dysfunction given longstanding DM and HTN.  Low risk Myoview . He is not volume overloaded today. His DOE is multifactorial. I think Afib/flutter is playing a major role.   Would try and avoid contrast studies given CKD.  Encourage regular aerobic exercise.  Not a candidate for aldactone or SGLT 2 inhibitor with his renal function. No evidence of OSA.  Paroxysmal Afib. [place] vasc score of 8. Now on Xarelto  and amiodarone . In atrial flutter today. Patient is symptomatic. AAD therapy limited by CKD. Has been on amiodarone  for one month without improvement. Recommend referral to EP to consider ablation DM type 2 on insulin . Last A1c 6.9% HLD - on Zetia . Intolerant to multiple meds Carotid arterial disease followed by vascular surgery in [place] CKD stage 4- followed by Nephrology History of prior CVA 4 years ago- no ASA since now on Xarelto .  HTN on multiple meds including labetalol  200 mg tid, hydralazine  100 mg tid, valsartan 160 mg daily, amlodipine  5 mg daily with fair control       Follow up with me in 4 months. Await EP evaluation.   Medication Adjustments/Labs and Tests Ordered: Current medicines are reviewed at length with the patient today.  Concerns regarding medicines are outlined above.  Orders Placed This Encounter  Procedures   EKG 12-Lead   No orders of the defined types were placed in this encounter.   There are no Patient Instructions on file for this visit.   Signed, Arkin Imran [place], MD  11/06/2023 8:46 AM    [facility]

## 2023-11-06 ENCOUNTER — Encounter: Payer: Self-pay | Admitting: Cardiology

## 2023-11-06 ENCOUNTER — Ambulatory Visit: Payer: Medicare Other | Attending: Cardiology | Admitting: Cardiology

## 2023-11-06 VITALS — BP 138/90 | HR 108 | Ht 75.0 in | Wt 225.0 lb

## 2023-11-06 DIAGNOSIS — I483 Typical atrial flutter: Secondary | ICD-10-CM

## 2023-11-06 DIAGNOSIS — I480 Paroxysmal atrial fibrillation: Secondary | ICD-10-CM

## 2023-11-06 DIAGNOSIS — I10 Essential (primary) hypertension: Secondary | ICD-10-CM

## 2023-11-06 DIAGNOSIS — E785 Hyperlipidemia, unspecified: Secondary | ICD-10-CM | POA: Diagnosis not present

## 2023-11-06 DIAGNOSIS — N184 Chronic kidney disease, stage 4 (severe): Secondary | ICD-10-CM

## 2023-11-06 DIAGNOSIS — R0609 Other forms of dyspnea: Secondary | ICD-10-CM

## 2023-11-06 NOTE — Patient Instructions (Signed)
 Medication Instructions:  Continue same medications *If you need a refill on your cardiac medications before your next appointment, please call your pharmacy*  Lab Work: None ordered  Testing/Procedures: None ordered  Follow-Up: At [facility], you and your health needs are our priority.  As part of our continuing mission to provide you with exceptional heart care, our providers are all part of one team.  This team includes your primary Cardiologist (physician) and Advanced Practice Providers or APPs (Physician Assistants and Nurse Practitioners) who all work together to provide you with the care you need, when you need it.  Your next appointment:  Friday 9/15 at 11:00 am    Provider:  Dr.Jordan     Dr.Lambert    Wed 5/14 at 10:30 am   We recommend signing up for the patient portal called "MyChart".  Sign up information is provided on this After Visit Summary.  MyChart is used to connect with patients for Virtual Visits (Telemedicine).  Patients are able to view lab/test results, encounter notes, upcoming appointments, etc.  Non-urgent messages can be sent to your provider as well.   To learn more about what you can do with MyChart, go to [email or web address].

## 2023-11-13 ENCOUNTER — Ambulatory Visit: Payer: Self-pay

## 2023-11-13 NOTE — Telephone Encounter (Signed)
 Chief Complaint: HTN Symptoms: weakness, loss of appetite, cough, malaise  Frequency: cough since the weekend, ongoing weakness and loss of appetite, high BP today Pertinent Negatives: Patient denies CP and SOB at this time, headache, dizziness Disposition: [x] ED /[] Urgent Care (no appt availability in office) / [] Appointment(In office/virtual)/ []   Virtual Care/ [] Home Care/ [] Refused Recommended Disposition /[]  Mobile Bus/ []  Follow-up with PCP Additional Notes: RN spoke with pt's spouse [place]. [name] states pt's BP today was 155/115 and then 142/83 despite taking his BP medications. Before recently [place] states his BP has been well-controlled. [place] endorses pt has been weak and developed a dry cough over the weekend. Pt has been spending most his time in bed. Yesterday pt walked the trash to the end of the driveway and had to lean against a tree as he became SOB. [place] states the pt was flushed and "breathing so hard." [place] states pt complained of chest pain yesterday that has resolved. Today pt has not reported CP or SOB. Pt has had ongoing decreased appetite and [place] has had to encourage him to eat and drink in order to take his medications. [place] denies he has reported dizziness or headaches and states it is hard to determine visual changes given his he has visual deficits after his CVA as well as cataracts. Pt saw a cardiologist last week and has an appt with an electrophysiologist tomorrow. RN advised [place] pt should go to the ED given the SOB and CP he experiences yesterday to rule out any acute worsening. [name] agreeable to that plan but states she will have to discuss it with the pt. [place] is not sure if he will go, but will speak with him about it. RN advised [place] if the pt develops CP again, SOB, dizziness, palpitations, diaphoresis, nausea or vomiting, she should call 911. [name] verbalized understanding.    Copied from CRM  [id number]. Topic: Clinical - Red Word Triage >> Nov 13, 2023  3:06 PM Alyse July wrote: Red Word that prompted transfer to Nurse Triage: BP:155/115 Reason for Disposition  [1] Systolic BP  >= 160 OR Diastolic >= 100 AND [2] cardiac (e.g., breathing difficulty, chest pain) or neurologic symptoms (e.g., new-onset blurred or double vision, unsteady gait)    155/115 and then 142/83  Answer Assessment - Initial Assessment Questions 1. BLOOD PRESSURE: "What is the blood pressure?" "Did you take at least two measurements 5 minutes apart?"     Most recent was 142/83, earlier today was 155/115 2. ONSET: "When did you take your blood pressure?"     Today  3. HOW: "How did you take your blood pressure?" (e.g., automatic home BP monitor, visiting nurse)     Automatic cuff at home 4. HISTORY: "Do you have a history of high blood pressure?"     Yes  5. MEDICINES: "Are you taking any medicines for blood pressure?" "Have you missed any doses recently?"     Amlodipine , labetalol , valsartan, hydralazine , amiodarone  w/ no missed doses 6. OTHER SYMPTOMS: "Do you have any symptoms?" (e.g., blurred vision, chest pain, difficulty breathing, headache, weakness)     Went to cardiologist last week who referred him to an electrophysiologist. Electrophysiologist appt on Wednesday. Has been weak. BP was high at the cardiologist. Seen 4/3 with [facility]. Took his BP meds today  Yesterday pt said "I don't feel right." Yesterday, per wife, pt was in bed all day. Pt developed a dry cough Saturday night. Did not sleep well. Cough last night as well. Reported  chills recently but no other flu-like symptoms  Wife states yesterday they took the trash out for exercise. Wife states taking the trash out "took a long time." Pt walked 3 ft and then he walked over to a tree and leaned over on the tree. Pt was "breathing so hard" and was flushed. Wife states now instead of breathing heavily or too fast, he's breathing slow "like he's  exhausted." Determined he has Afib. Loss of appetite today/ongoing problem  Complained of CP "once" yesterday. Spouse states pt struggles to express himself since stroke. "He has not said anything about a headache, vision is touch and go because he had damage to his L eye from his stroke, he had cataract surgery in the summer, so his vision is sometimes good, sometimes bad."  Protocols used: Blood Pressure - High-A-AH

## 2023-11-14 NOTE — H&P (View-Only) (Signed)
 Electrophysiology Office Note:    Date:  11/15/2023   ID:  BENHARD ALPIZAR, DOB Aug 06, 1945, MRN [id number]  [facility] Cardiologist:  [name], MD  [facility] Electrophysiologist:  [name], MD   Referring MD: [name], NP   Chief Complaint: Atrial fibrillation  History of Present Illness:    Mr. Septer is a 78 year old man who I am seeing today for an evaluation of atrial fibrillation at the request of Dr. Swaziland.  He has a history of diabetes, hypertension, hyperlipidemia, stroke and CKD.  He had a loop recorder implanted at the time of his stroke in 2019. A heart monitor ordered by a PCP late in 2024 showed AF burden of 12% with symptoms. On xarelto  now for stroke ppx.   Mr. Crilley is quite symptomatic with his atrial fibrillation.  He experiences fatigue and an internal anxiety feeling/uneasiness.  He takes his Xarelto  without missed doses.      Their past medical, social and family history was reviewed.   ROS:   Please see the history of present illness.    All other systems reviewed and are negative.  EKGs/Labs/Other Studies Reviewed:    The following studies were reviewed today:  08/26/2023 zio AF burden 12% Frequent PVC, 8.4% No pauses  07/05/2022 Echo EF 45 RV normal LA severely dilated  05/21/2007 ECG shows sinus rhythm  07/08/2017 ECG shows sinus rhythm  11/06/2023 ECG shows AFL        Physical Exam:    VS:  BP 110/72   Pulse (!) 109   Ht 6' (1.829 m)   Wt 227 lb 8 oz (103.2 kg)   SpO2 97%   BMI 30.85 kg/m     Wt Readings from Last 3 Encounters:  11/15/23 227 lb 8 oz (103.2 kg)  11/06/23 225 lb (102.1 kg)  10/05/23 224 lb (101.6 kg)     GEN: no distress CARD: Regular rhythm, tachycardic, No MRG RESP: No IWOB. CTAB.        ASSESSMENT AND PLAN:    1. Primary hypertension   2. Typical atrial flutter (HCC)   3. Persistent atrial fibrillation (HCC)     #AF #AFL Symptomatic. Burden 12% on recent  monitor. On xarelto  for stroke ppx.  I discussed treatment options for the patient's atrial fibrillation flutter during his clinic appointment occluding conservative management, antiarrhythmic drugs and catheter ablation.  I discussed the procedural details including the risks, recovery and likelihood of success.  Discussed treatment options today for AF including antiarrhythmic drug therapy and ablation. Discussed risks, recovery and likelihood of success with each treatment strategy. Risk, benefits, and alternatives to EP study and ablation for afib were discussed. These risks include but are not limited to stroke, bleeding, vascular damage, tamponade, perforation, damage to the esophagus, lungs, phrenic nerve and other structures, pulmonary vein stenosis, worsening renal function, coronary vasospasm and death.  Discussed potential need for repeat ablation procedures and antiarrhythmic drugs after an initial ablation. The patient understands these risk and wishes to proceed.  We will therefore proceed with catheter ablation at the next available time.  Carto, ICE, anesthesia are requested for the procedure.  Will also obtain CT PV protocol prior to the procedure to exclude LAA thrombus and further evaluate atrial anatomy.  Ablation strategy would be PVI plus posterior wall plus CTI.  Temporarily, plan for cardioversion to provide some immediate relief.  I discussed the cardioversion procedure in detail including the risks and he wishes to proceed.   #Hypertension  At goal today.  Recommend checking blood pressures 1-2 times per week at home and recording the values.  Recommend bringing these recordings to the primary care physician.       Signed, Leanora Prophet. [name], MD, [facility], [facility] 11/15/2023 10:39 AM    Electrophysiology [facility]

## 2023-11-14 NOTE — Telephone Encounter (Signed)
**Note De-identified  [place] Obfuscation** Please advise 

## 2023-11-14 NOTE — Progress Notes (Unsigned)
 Electrophysiology Office Note:    Date:  11/15/2023   ID:  BENHARD ALPIZAR, DOB Aug 06, 1945, MRN [id number]  [facility] Cardiologist:  [name], MD  [facility] Electrophysiologist:  [name], MD   Referring MD: [name], NP   Chief Complaint: Atrial fibrillation  History of Present Illness:    Mr. Septer is a 78 year old man who I am seeing today for an evaluation of atrial fibrillation at the request of Dr. Swaziland.  He has a history of diabetes, hypertension, hyperlipidemia, stroke and CKD.  He had a loop recorder implanted at the time of his stroke in 2019. A heart monitor ordered by a PCP late in 2024 showed AF burden of 12% with symptoms. On xarelto  now for stroke ppx.   Mr. Crilley is quite symptomatic with his atrial fibrillation.  He experiences fatigue and an internal anxiety feeling/uneasiness.  He takes his Xarelto  without missed doses.      Their past medical, social and family history was reviewed.   ROS:   Please see the history of present illness.    All other systems reviewed and are negative.  EKGs/Labs/Other Studies Reviewed:    The following studies were reviewed today:  08/26/2023 zio AF burden 12% Frequent PVC, 8.4% No pauses  07/05/2022 Echo EF 45 RV normal LA severely dilated  05/21/2007 ECG shows sinus rhythm  07/08/2017 ECG shows sinus rhythm  11/06/2023 ECG shows AFL        Physical Exam:    VS:  BP 110/72   Pulse (!) 109   Ht 6' (1.829 m)   Wt 227 lb 8 oz (103.2 kg)   SpO2 97%   BMI 30.85 kg/m     Wt Readings from Last 3 Encounters:  11/15/23 227 lb 8 oz (103.2 kg)  11/06/23 225 lb (102.1 kg)  10/05/23 224 lb (101.6 kg)     GEN: no distress CARD: Regular rhythm, tachycardic, No MRG RESP: No IWOB. CTAB.        ASSESSMENT AND PLAN:    1. Primary hypertension   2. Typical atrial flutter (HCC)   3. Persistent atrial fibrillation (HCC)     #AF #AFL Symptomatic. Burden 12% on recent  monitor. On xarelto  for stroke ppx.  I discussed treatment options for the patient's atrial fibrillation flutter during his clinic appointment occluding conservative management, antiarrhythmic drugs and catheter ablation.  I discussed the procedural details including the risks, recovery and likelihood of success.  Discussed treatment options today for AF including antiarrhythmic drug therapy and ablation. Discussed risks, recovery and likelihood of success with each treatment strategy. Risk, benefits, and alternatives to EP study and ablation for afib were discussed. These risks include but are not limited to stroke, bleeding, vascular damage, tamponade, perforation, damage to the esophagus, lungs, phrenic nerve and other structures, pulmonary vein stenosis, worsening renal function, coronary vasospasm and death.  Discussed potential need for repeat ablation procedures and antiarrhythmic drugs after an initial ablation. The patient understands these risk and wishes to proceed.  We will therefore proceed with catheter ablation at the next available time.  Carto, ICE, anesthesia are requested for the procedure.  Will also obtain CT PV protocol prior to the procedure to exclude LAA thrombus and further evaluate atrial anatomy.  Ablation strategy would be PVI plus posterior wall plus CTI.  Temporarily, plan for cardioversion to provide some immediate relief.  I discussed the cardioversion procedure in detail including the risks and he wishes to proceed.   #Hypertension  At goal today.  Recommend checking blood pressures 1-2 times per week at home and recording the values.  Recommend bringing these recordings to the primary care physician.       Signed, Leanora Prophet. [name], MD, [facility], [facility] 11/15/2023 10:39 AM    Electrophysiology [facility]

## 2023-11-15 ENCOUNTER — Other Ambulatory Visit: Payer: Self-pay

## 2023-11-15 ENCOUNTER — Ambulatory Visit: Attending: Cardiology | Admitting: Cardiology

## 2023-11-15 ENCOUNTER — Encounter: Payer: Self-pay | Admitting: Cardiology

## 2023-11-15 VITALS — BP 110/72 | HR 109 | Ht 72.0 in | Wt 227.5 lb

## 2023-11-15 DIAGNOSIS — I4819 Other persistent atrial fibrillation: Secondary | ICD-10-CM

## 2023-11-15 DIAGNOSIS — I483 Typical atrial flutter: Secondary | ICD-10-CM | POA: Diagnosis not present

## 2023-11-15 DIAGNOSIS — I10 Essential (primary) hypertension: Secondary | ICD-10-CM

## 2023-11-15 LAB — CBC

## 2023-11-15 NOTE — Telephone Encounter (Signed)
 Pt followed triage advise and is now in the ED.

## 2023-11-15 NOTE — Patient Instructions (Addendum)
 Medication Instructions:  Your physician recommends that you continue on your current medications as directed. Please refer to the Current Medication list given to you today.  *If you need a refill on your cardiac medications before your next appointment, please call your pharmacy*  Lab Work: TODAY: BMET and CBC  Testing/Procedures: Cardioversion  Your physician has recommended that you have a Cardioversion (DCCV). Electrical Cardioversion uses a jolt of electricity to your heart either through paddles or wired patches attached to your chest. This is a controlled, usually prescheduled, procedure. Defibrillation is done under light anesthesia in the hospital, and you usually go home the day of the procedure. This is done to get your heart back into a normal rhythm. You are not awake for the procedure. Please see the instruction sheet given to you today.  Cardiac CT Your physician has requested that you have cardiac CT. Cardiac computed tomography (CT) is a painless test that uses an x-ray machine to take clear, detailed pictures of your heart. For further information please visit [email or web address] We will call you to schedule your CT scan. It will be done about three weeks prior to your ablation.  Ablation Your physician has recommended that you have an ablation. Catheter ablation is a medical procedure used to treat some cardiac arrhythmias (irregular heartbeats). During catheter ablation, a long, thin, flexible tube is put into a blood vessel in your groin (upper thigh), or neck. This tube is called an ablation catheter. It is then guided to your heart through the blood vessel. Radio frequency waves destroy small areas of heart tissue where abnormal heartbeats may cause an arrhythmia to start. You are scheduled for Atrial Fibrillation Ablation on Tuesday, August 5 with Dr. Harvie Liner.Please arrive at the Main Entrance A at [facility]: [address] [place], [place] [address] at  5:30 AM    Follow-Up: At [facility], you and your health needs are our priority.  As part of our continuing mission to provide you with exceptional heart care, we have created designated Provider Care Teams.  These Care Teams include your primary Cardiologist (physician) and Advanced Practice Providers (APPs -  Physician Assistants and Nurse Practitioners) who all work together to provide you with the care you need, when you need it.   Your next appointment:   We will contact you about your post-procedure follow up appointments.

## 2023-11-16 ENCOUNTER — Ambulatory Visit: Payer: Self-pay

## 2023-11-16 LAB — BASIC METABOLIC PANEL WITH GFR
BUN/Creatinine Ratio: 10 (ref 10–24)
BUN: 30 mg/dL — ABNORMAL HIGH (ref 8–27)
CO2: 19 mmol/L — ABNORMAL LOW (ref 20–29)
Calcium: 9.5 mg/dL (ref 8.6–10.2)
Chloride: 105 mmol/L (ref 96–106)
Creatinine, Ser: 3.13 mg/dL — ABNORMAL HIGH (ref 0.76–1.27)
Glucose: 192 mg/dL — ABNORMAL HIGH (ref 70–99)
Potassium: 4.6 mmol/L (ref 3.5–5.2)
Sodium: 141 mmol/L (ref 134–144)
eGFR: 20 mL/min/{1.73_m2} — ABNORMAL LOW (ref >59)

## 2023-11-16 LAB — CBC
Hematocrit: 35.7 % — ABNORMAL LOW (ref 37.5–51.0)
Hemoglobin: 11.6 g/dL — ABNORMAL LOW (ref 13.0–17.7)
MCH: 27.2 pg (ref 26.6–33.0)
MCHC: 32.5 g/dL (ref 31.5–35.7)
MCV: 84 fL (ref 79–97)
Platelets: 312 10*3/uL (ref 150–450)
RBC: 4.27 x10E6/uL (ref 4.14–5.80)
RDW: 15.2 % (ref 11.6–15.4)
WBC: 8.3 10*3/uL (ref 3.4–10.8)

## 2023-11-17 NOTE — Progress Notes (Signed)
 Spoke to patient and instructed them to come at 0730  and to be NPO after 0000.  Medications reviewed.    Confirmed that patient will have a ride home and someone to stay with them for 24 hours after the procedure.

## 2023-11-20 ENCOUNTER — Ambulatory Visit (HOSPITAL_COMMUNITY)

## 2023-11-20 ENCOUNTER — Encounter (HOSPITAL_COMMUNITY): Payer: Self-pay | Admitting: Cardiovascular Disease

## 2023-11-20 ENCOUNTER — Ambulatory Visit (HOSPITAL_COMMUNITY)
Admission: RE | Admit: 2023-11-20 | Discharge: 2023-11-20 | Disposition: A | Discharge status: Home / Self Care | Source: Home / Self Care | Attending: Cardiovascular Disease | Admitting: Cardiovascular Disease

## 2023-11-20 ENCOUNTER — Encounter (HOSPITAL_COMMUNITY)
Admission: RE | Disposition: A | Discharge status: Home / Self Care | Payer: Self-pay | Source: Home / Self Care | Attending: Cardiovascular Disease

## 2023-11-20 ENCOUNTER — Other Ambulatory Visit: Payer: Self-pay

## 2023-11-20 DIAGNOSIS — I472 Ventricular tachycardia, unspecified: Secondary | ICD-10-CM | POA: Diagnosis not present

## 2023-11-20 DIAGNOSIS — I5043 Acute on chronic combined systolic (congestive) and diastolic (congestive) heart failure: Secondary | ICD-10-CM | POA: Diagnosis not present

## 2023-11-20 DIAGNOSIS — I129 Hypertensive chronic kidney disease with stage 1 through stage 4 chronic kidney disease, or unspecified chronic kidney disease: Secondary | ICD-10-CM | POA: Diagnosis not present

## 2023-11-20 DIAGNOSIS — J811 Chronic pulmonary edema: Secondary | ICD-10-CM | POA: Diagnosis not present

## 2023-11-20 DIAGNOSIS — I509 Heart failure, unspecified: Secondary | ICD-10-CM | POA: Diagnosis not present

## 2023-11-20 DIAGNOSIS — I4819 Other persistent atrial fibrillation: Secondary | ICD-10-CM | POA: Insufficient documentation

## 2023-11-20 DIAGNOSIS — Z7901 Long term (current) use of anticoagulants: Secondary | ICD-10-CM | POA: Insufficient documentation

## 2023-11-20 DIAGNOSIS — I5023 Acute on chronic systolic (congestive) heart failure: Secondary | ICD-10-CM | POA: Diagnosis not present

## 2023-11-20 DIAGNOSIS — R918 Other nonspecific abnormal finding of lung field: Secondary | ICD-10-CM | POA: Diagnosis not present

## 2023-11-20 DIAGNOSIS — I4891 Unspecified atrial fibrillation: Secondary | ICD-10-CM

## 2023-11-20 DIAGNOSIS — I480 Paroxysmal atrial fibrillation: Secondary | ICD-10-CM | POA: Diagnosis not present

## 2023-11-20 DIAGNOSIS — R0602 Shortness of breath: Secondary | ICD-10-CM | POA: Diagnosis not present

## 2023-11-20 DIAGNOSIS — I499 Cardiac arrhythmia, unspecified: Secondary | ICD-10-CM | POA: Diagnosis not present

## 2023-11-20 DIAGNOSIS — I110 Hypertensive heart disease with heart failure: Secondary | ICD-10-CM | POA: Diagnosis not present

## 2023-11-20 DIAGNOSIS — Z8673 Personal history of transient ischemic attack (TIA), and cerebral infarction without residual deficits: Secondary | ICD-10-CM | POA: Insufficient documentation

## 2023-11-20 DIAGNOSIS — I483 Typical atrial flutter: Secondary | ICD-10-CM | POA: Insufficient documentation

## 2023-11-20 DIAGNOSIS — I5031 Acute diastolic (congestive) heart failure: Secondary | ICD-10-CM | POA: Diagnosis not present

## 2023-11-20 DIAGNOSIS — D631 Anemia in chronic kidney disease: Secondary | ICD-10-CM | POA: Diagnosis not present

## 2023-11-20 DIAGNOSIS — R609 Edema, unspecified: Secondary | ICD-10-CM | POA: Diagnosis not present

## 2023-11-20 DIAGNOSIS — Z95818 Presence of other cardiac implants and grafts: Secondary | ICD-10-CM | POA: Insufficient documentation

## 2023-11-20 DIAGNOSIS — R079 Chest pain, unspecified: Secondary | ICD-10-CM | POA: Diagnosis not present

## 2023-11-20 DIAGNOSIS — E1122 Type 2 diabetes mellitus with diabetic chronic kidney disease: Secondary | ICD-10-CM

## 2023-11-20 DIAGNOSIS — R069 Unspecified abnormalities of breathing: Secondary | ICD-10-CM | POA: Diagnosis not present

## 2023-11-20 DIAGNOSIS — I130 Hypertensive heart and chronic kidney disease with heart failure and stage 1 through stage 4 chronic kidney disease, or unspecified chronic kidney disease: Secondary | ICD-10-CM | POA: Diagnosis not present

## 2023-11-20 DIAGNOSIS — N184 Chronic kidney disease, stage 4 (severe): Secondary | ICD-10-CM | POA: Diagnosis not present

## 2023-11-20 DIAGNOSIS — Z794 Long term (current) use of insulin: Secondary | ICD-10-CM | POA: Diagnosis not present

## 2023-11-20 DIAGNOSIS — I10 Essential (primary) hypertension: Secondary | ICD-10-CM | POA: Diagnosis not present

## 2023-11-20 DIAGNOSIS — Z888 Allergy status to other drugs, medicaments and biological substances status: Secondary | ICD-10-CM | POA: Diagnosis not present

## 2023-11-20 DIAGNOSIS — N179 Acute kidney failure, unspecified: Secondary | ICD-10-CM | POA: Diagnosis not present

## 2023-11-20 DIAGNOSIS — Z833 Family history of diabetes mellitus: Secondary | ICD-10-CM | POA: Diagnosis not present

## 2023-11-20 DIAGNOSIS — Z881 Allergy status to other antibiotic agents status: Secondary | ICD-10-CM | POA: Diagnosis not present

## 2023-11-20 DIAGNOSIS — Z79899 Other long term (current) drug therapy: Secondary | ICD-10-CM | POA: Insufficient documentation

## 2023-11-20 DIAGNOSIS — R7989 Other specified abnormal findings of blood chemistry: Secondary | ICD-10-CM | POA: Diagnosis not present

## 2023-11-20 DIAGNOSIS — N189 Chronic kidney disease, unspecified: Secondary | ICD-10-CM | POA: Insufficient documentation

## 2023-11-20 DIAGNOSIS — J9601 Acute respiratory failure with hypoxia: Secondary | ICD-10-CM | POA: Diagnosis not present

## 2023-11-20 DIAGNOSIS — E785 Hyperlipidemia, unspecified: Secondary | ICD-10-CM | POA: Diagnosis not present

## 2023-11-20 DIAGNOSIS — Z882 Allergy status to sulfonamides status: Secondary | ICD-10-CM | POA: Diagnosis not present

## 2023-11-20 DIAGNOSIS — I484 Atypical atrial flutter: Secondary | ICD-10-CM

## 2023-11-20 DIAGNOSIS — E876 Hypokalemia: Secondary | ICD-10-CM | POA: Diagnosis not present

## 2023-11-20 DIAGNOSIS — D638 Anemia in other chronic diseases classified elsewhere: Secondary | ICD-10-CM | POA: Diagnosis not present

## 2023-11-20 DIAGNOSIS — E119 Type 2 diabetes mellitus without complications: Secondary | ICD-10-CM | POA: Diagnosis not present

## 2023-11-20 DIAGNOSIS — I2489 Other forms of acute ischemic heart disease: Secondary | ICD-10-CM | POA: Diagnosis not present

## 2023-11-20 DIAGNOSIS — J90 Pleural effusion, not elsewhere classified: Secondary | ICD-10-CM | POA: Diagnosis not present

## 2023-11-20 DIAGNOSIS — E66811 Obesity, class 1: Secondary | ICD-10-CM | POA: Diagnosis not present

## 2023-11-20 DIAGNOSIS — Z8249 Family history of ischemic heart disease and other diseases of the circulatory system: Secondary | ICD-10-CM | POA: Diagnosis not present

## 2023-11-20 DIAGNOSIS — Z01818 Encounter for other preprocedural examination: Secondary | ICD-10-CM

## 2023-11-20 HISTORY — PX: CARDIOVERSION: EP1203

## 2023-11-20 LAB — GLUCOSE, CAPILLARY: Glucose-Capillary: 177 mg/dL — ABNORMAL HIGH (ref 70–99)

## 2023-11-20 SURGERY — CARDIOVERSION (CATH LAB)
Anesthesia: General

## 2023-11-20 MED ORDER — SODIUM CHLORIDE 0.9 % IV SOLN: INTRAVENOUS | Status: DC

## 2023-11-20 MED ORDER — PROPOFOL 10 MG/ML IV BOLUS
INTRAVENOUS | Status: DC | PRN
  Administered 2023-11-20: 100 mg via INTRAVENOUS

## 2023-11-20 MED ORDER — LIDOCAINE 2% (20 MG/ML) 5 ML SYRINGE
INTRAMUSCULAR | Status: DC | PRN
  Administered 2023-11-20: 100 mg via INTRAVENOUS

## 2023-11-20 SURGICAL SUPPLY — 1 items: PAD DEFIB RADIO PHYSIO CONN (PAD) ×1 IMPLANT

## 2023-11-20 NOTE — Anesthesia Preprocedure Evaluation (Signed)
 Anesthesia Evaluation    Airway Mallampati: II  TM Distance: >3 FB Neck ROM: Full    Dental no notable dental hx.    Pulmonary    Pulmonary exam normal breath sounds clear to auscultation       Cardiovascular hypertension, Pt. on medications Normal cardiovascular exam+ dysrhythmias Atrial Fibrillation  Rhythm:Irregular Rate:Normal     Neuro/Psych CVA    GI/Hepatic   Endo/Other  diabetes, Type 2    Renal/GU Renal InsufficiencyRenal disease     Musculoskeletal   Abdominal  (+) + obese  Peds  Hematology  (+) Blood dyscrasia, anemia   Anesthesia Other Findings   Reproductive/Obstetrics                             Anesthesia Physical Anesthesia Plan  ASA: 3  Anesthesia Plan: General   Post-op Pain Management: Minimal or no pain anticipated   Induction: Intravenous  PONV Risk Score and Plan: 2 and Propofol  infusion, TIVA and Treatment may vary due to age or medical condition  Airway Management Planned: Mask  Additional Equipment:   Intra-op Plan:   Post-operative Plan:   Informed Consent: I have reviewed the patients History and Physical, chart, labs and discussed the procedure including the risks, benefits and alternatives for the proposed anesthesia with the patient or authorized representative who has indicated his/her understanding and acceptance.     Dental advisory given  Plan Discussed with: CRNA  Anesthesia Plan Comments:        Anesthesia Quick Evaluation

## 2023-11-20 NOTE — Interval H&P Note (Signed)
 History and Physical Interval Note:  11/20/2023 7:52 AM  [name] [name]  has presented today for surgery, with the diagnosis of AFIB.  The various methods of treatment have been discussed with the patient and family. After consideration of risks, benefits and other options for treatment, the patient has consented to  Procedure(s): CARDIOVERSION (N/A) as a surgical intervention.  The patient's history has been reviewed, patient examined, no change in status, stable for surgery.  I have reviewed the patient's chart and labs.  Questions were answered to the patient's satisfaction.     Kennadie Brenner

## 2023-11-20 NOTE — Anesthesia Postprocedure Evaluation (Signed)
 Anesthesia Post Note  Patient: [name] [name]  Procedure(s) Performed: CARDIOVERSION     Patient location during evaluation: PACU Anesthesia Type: General Level of consciousness: awake and alert Pain management: pain level controlled Vital Signs Assessment: post-procedure vital signs reviewed and stable Respiratory status: spontaneous breathing, nonlabored ventilation and respiratory function stable Cardiovascular status: blood pressure returned to baseline and stable Postop Assessment: no apparent nausea or vomiting Anesthetic complications: no   No notable events documented.  Last Vitals:  Vitals:   11/20/23 0850 11/20/23 0900  BP: 129/77 136/82  Pulse: 68 70  Resp: (!) 27 (!) 25  Temp:  36.7 C  SpO2: 95% 95%    Last Pain:  Vitals:   11/20/23 0900  TempSrc: Temporal  PainSc:                  [name]

## 2023-11-20 NOTE — Op Note (Signed)
 Procedure: Electrical Cardioversion Indications:  Atrial Fibrillation  Procedure Details:  Consent: Risks of procedure as well as the alternatives and risks of each were explained to the (patient/caregiver).  Consent for procedure obtained.  Time Out: Verified patient identification, verified procedure, site/side was marked, verified correct patient position, special equipment/implants available, medications/allergies/relevent history reviewed, required imaging and test results available.  Performed  Patient placed on cardiac monitor, pulse oximetry, supplemental oxygen as necessary.  Sedation given: propofol  IV, Dr. Annabell Key Pacer pads placed anterior and posterior chest.  Cardioverted 1 time(s).  Cardioversion with synchronized biphasic 200J shock.  Evaluation: Findings: Post procedure EKG shows: NSR Complications: None Patient did tolerate procedure well.  Time Spent Directly with the Patient:  30 minutes   Neesa Knapik 11/20/2023, 8:38 AM

## 2023-11-20 NOTE — Discharge Instructions (Signed)

## 2023-11-20 NOTE — Transfer of Care (Signed)
 Immediate Anesthesia Transfer of Care Note  Patient: [name] [name]  Procedure(s) Performed: CARDIOVERSION  Patient Location: Cath Lab  Anesthesia Type:General  Level of Consciousness: awake, alert , and oriented  Airway & Oxygen Therapy: Patient Spontanous Breathing and Patient connected to nasal cannula oxygen  Post-op Assessment: Report given to RN and Post -op Vital signs reviewed and stable  Post vital signs: Reviewed and stable  Last Vitals:  Vitals Value Taken Time  BP    Temp    Pulse    Resp    SpO2      Last Pain:  Vitals:   11/20/23 0809  PainSc: 0-No pain         Complications: No notable events documented.

## 2023-11-21 ENCOUNTER — Emergency Department (HOSPITAL_COMMUNITY)

## 2023-11-21 ENCOUNTER — Other Ambulatory Visit: Payer: Self-pay

## 2023-11-21 ENCOUNTER — Observation Stay (HOSPITAL_COMMUNITY)

## 2023-11-21 ENCOUNTER — Encounter (HOSPITAL_COMMUNITY): Payer: Self-pay | Admitting: *Deleted

## 2023-11-21 ENCOUNTER — Inpatient Hospital Stay (HOSPITAL_COMMUNITY)
Admission: EM | Admit: 2023-11-21 | Discharge: 2023-11-24 | DRG: 291 | Disposition: A | Discharge status: Home / Self Care | Attending: Family Medicine | Admitting: Family Medicine

## 2023-11-21 DIAGNOSIS — Z91041 Radiographic dye allergy status: Secondary | ICD-10-CM

## 2023-11-21 DIAGNOSIS — I5031 Acute diastolic (congestive) heart failure: Secondary | ICD-10-CM

## 2023-11-21 DIAGNOSIS — I480 Paroxysmal atrial fibrillation: Secondary | ICD-10-CM | POA: Diagnosis not present

## 2023-11-21 DIAGNOSIS — J9601 Acute respiratory failure with hypoxia: Secondary | ICD-10-CM | POA: Diagnosis not present

## 2023-11-21 DIAGNOSIS — D638 Anemia in other chronic diseases classified elsewhere: Secondary | ICD-10-CM | POA: Diagnosis present

## 2023-11-21 DIAGNOSIS — N184 Chronic kidney disease, stage 4 (severe): Secondary | ICD-10-CM | POA: Diagnosis not present

## 2023-11-21 DIAGNOSIS — Z823 Family history of stroke: Secondary | ICD-10-CM

## 2023-11-21 DIAGNOSIS — Z6830 Body mass index (BMI) 30.0-30.9, adult: Secondary | ICD-10-CM

## 2023-11-21 DIAGNOSIS — F419 Anxiety disorder, unspecified: Secondary | ICD-10-CM | POA: Diagnosis present

## 2023-11-21 DIAGNOSIS — Z8249 Family history of ischemic heart disease and other diseases of the circulatory system: Secondary | ICD-10-CM

## 2023-11-21 DIAGNOSIS — Z881 Allergy status to other antibiotic agents status: Secondary | ICD-10-CM

## 2023-11-21 DIAGNOSIS — I5043 Acute on chronic combined systolic (congestive) and diastolic (congestive) heart failure: Secondary | ICD-10-CM

## 2023-11-21 DIAGNOSIS — I4819 Other persistent atrial fibrillation: Secondary | ICD-10-CM

## 2023-11-21 DIAGNOSIS — Z833 Family history of diabetes mellitus: Secondary | ICD-10-CM

## 2023-11-21 DIAGNOSIS — Z95818 Presence of other cardiac implants and grafts: Secondary | ICD-10-CM

## 2023-11-21 DIAGNOSIS — I2489 Other forms of acute ischemic heart disease: Secondary | ICD-10-CM | POA: Diagnosis present

## 2023-11-21 DIAGNOSIS — N179 Acute kidney failure, unspecified: Secondary | ICD-10-CM | POA: Diagnosis present

## 2023-11-21 DIAGNOSIS — E119 Type 2 diabetes mellitus without complications: Secondary | ICD-10-CM

## 2023-11-21 DIAGNOSIS — I10 Essential (primary) hypertension: Secondary | ICD-10-CM

## 2023-11-21 DIAGNOSIS — R7989 Other specified abnormal findings of blood chemistry: Secondary | ICD-10-CM | POA: Diagnosis not present

## 2023-11-21 DIAGNOSIS — Z794 Long term (current) use of insulin: Secondary | ICD-10-CM

## 2023-11-21 DIAGNOSIS — E876 Hypokalemia: Secondary | ICD-10-CM | POA: Diagnosis present

## 2023-11-21 DIAGNOSIS — D631 Anemia in chronic kidney disease: Secondary | ICD-10-CM | POA: Diagnosis present

## 2023-11-21 DIAGNOSIS — E785 Hyperlipidemia, unspecified: Secondary | ICD-10-CM | POA: Diagnosis present

## 2023-11-21 DIAGNOSIS — I5023 Acute on chronic systolic (congestive) heart failure: Secondary | ICD-10-CM

## 2023-11-21 DIAGNOSIS — Z79899 Other long term (current) drug therapy: Secondary | ICD-10-CM

## 2023-11-21 DIAGNOSIS — E1122 Type 2 diabetes mellitus with diabetic chronic kidney disease: Secondary | ICD-10-CM | POA: Diagnosis present

## 2023-11-21 DIAGNOSIS — Z888 Allergy status to other drugs, medicaments and biological substances status: Secondary | ICD-10-CM

## 2023-11-21 DIAGNOSIS — I483 Typical atrial flutter: Secondary | ICD-10-CM | POA: Diagnosis present

## 2023-11-21 DIAGNOSIS — I509 Heart failure, unspecified: Principal | ICD-10-CM

## 2023-11-21 DIAGNOSIS — Z8673 Personal history of transient ischemic attack (TIA), and cerebral infarction without residual deficits: Secondary | ICD-10-CM

## 2023-11-21 DIAGNOSIS — Z882 Allergy status to sulfonamides status: Secondary | ICD-10-CM

## 2023-11-21 DIAGNOSIS — Z7901 Long term (current) use of anticoagulants: Secondary | ICD-10-CM

## 2023-11-21 DIAGNOSIS — I130 Hypertensive heart and chronic kidney disease with heart failure and stage 1 through stage 4 chronic kidney disease, or unspecified chronic kidney disease: Principal | ICD-10-CM | POA: Diagnosis present

## 2023-11-21 DIAGNOSIS — E66811 Obesity, class 1: Secondary | ICD-10-CM

## 2023-11-21 DIAGNOSIS — Z880 Allergy status to penicillin: Secondary | ICD-10-CM

## 2023-11-21 DIAGNOSIS — I472 Ventricular tachycardia, unspecified: Secondary | ICD-10-CM | POA: Diagnosis not present

## 2023-11-21 HISTORY — DX: Unspecified atrial fibrillation: I48.91

## 2023-11-21 HISTORY — DX: Heart failure, unspecified: I50.9

## 2023-11-21 LAB — CBC WITH DIFFERENTIAL/PLATELET
Abs Immature Granulocytes: 0.03 10*3/uL (ref 0.00–0.07)
Basophils Absolute: 0.1 10*3/uL (ref 0.0–0.1)
Basophils Relative: 1 %
Eosinophils Absolute: 0.2 10*3/uL (ref 0.0–0.5)
Eosinophils Relative: 2 %
HCT: 33.7 % — ABNORMAL LOW (ref 39.0–52.0)
Hemoglobin: 11.3 g/dL — ABNORMAL LOW (ref 13.0–17.0)
Immature Granulocytes: 0 %
Lymphocytes Relative: 15 %
Lymphs Abs: 1.4 10*3/uL (ref 0.7–4.0)
MCH: 26.6 pg (ref 26.0–34.0)
MCHC: 33.5 g/dL (ref 30.0–36.0)
MCV: 79.3 fL — ABNORMAL LOW (ref 80.0–100.0)
Monocytes Absolute: 0.7 10*3/uL (ref 0.1–1.0)
Monocytes Relative: 7 %
Neutro Abs: 7.1 10*3/uL (ref 1.7–7.7)
Neutrophils Relative %: 75 %
Platelets: 296 10*3/uL (ref 150–400)
RBC: 4.25 MIL/uL (ref 4.22–5.81)
RDW: 14.9 % (ref 11.5–15.5)
WBC: 9.6 10*3/uL (ref 4.0–10.5)
nRBC: 0 % (ref 0.0–0.2)

## 2023-11-21 LAB — COMPREHENSIVE METABOLIC PANEL WITH GFR
ALT: 10 U/L (ref 0–44)
AST: 14 U/L — ABNORMAL LOW (ref 15–41)
Albumin: 3.8 g/dL (ref 3.5–5.0)
Alkaline Phosphatase: 58 U/L (ref 38–126)
Anion gap: 11 (ref 5–15)
BUN: 34 mg/dL — ABNORMAL HIGH (ref 8–23)
CO2: 19 mmol/L — ABNORMAL LOW (ref 22–32)
Calcium: 9.2 mg/dL (ref 8.9–10.3)
Chloride: 108 mmol/L (ref 98–111)
Creatinine, Ser: 2.9 mg/dL — ABNORMAL HIGH (ref 0.61–1.24)
GFR, Estimated: 22 mL/min — ABNORMAL LOW (ref >60)
Glucose, Bld: 199 mg/dL — ABNORMAL HIGH (ref 70–99)
Potassium: 3.9 mmol/L (ref 3.5–5.1)
Sodium: 138 mmol/L (ref 135–145)
Total Bilirubin: 1.2 mg/dL (ref 0.0–1.2)
Total Protein: 7.2 g/dL (ref 6.5–8.1)

## 2023-11-21 LAB — ECHOCARDIOGRAM COMPLETE
Area-P 1/2: 5.58 cm2
S' Lateral: 4.9 cm

## 2023-11-21 LAB — LIPASE, BLOOD: Lipase: 34 U/L (ref 11–51)

## 2023-11-21 LAB — CBG MONITORING, ED: Glucose-Capillary: 174 mg/dL — ABNORMAL HIGH (ref 70–99)

## 2023-11-21 LAB — TROPONIN I (HIGH SENSITIVITY)
Troponin I (High Sensitivity): 47 ng/L — ABNORMAL HIGH (ref <18)
Troponin I (High Sensitivity): 52 ng/L — ABNORMAL HIGH (ref <18)

## 2023-11-21 LAB — BRAIN NATRIURETIC PEPTIDE: B Natriuretic Peptide: 595.2 pg/mL — ABNORMAL HIGH (ref 0.0–100.0)

## 2023-11-21 LAB — PROCALCITONIN: Procalcitonin: <0.1 ng/mL

## 2023-11-21 LAB — GLUCOSE, CAPILLARY
Glucose-Capillary: 156 mg/dL — ABNORMAL HIGH (ref 70–99)
Glucose-Capillary: 192 mg/dL — ABNORMAL HIGH (ref 70–99)

## 2023-11-21 MED ORDER — FUROSEMIDE 10 MG/ML IJ SOLN
40.0000 mg | Freq: Two times a day (BID) | INTRAMUSCULAR | Status: DC
  Administered 2023-11-21 – 2023-11-22 (×3): 40 mg via INTRAVENOUS
  Filled 2023-11-21 (×3): qty 4

## 2023-11-21 MED ORDER — ONDANSETRON HCL 4 MG/2ML IJ SOLN: 4.0000 mg | Freq: Four times a day (QID) | INTRAMUSCULAR | Status: DC | PRN

## 2023-11-21 MED ORDER — SODIUM CHLORIDE 0.9% FLUSH: 3.0000 mL | INTRAVENOUS | Status: DC | PRN

## 2023-11-21 MED ORDER — SODIUM CHLORIDE 0.9% FLUSH
3.0000 mL | Freq: Two times a day (BID) | INTRAVENOUS | Status: DC
  Administered 2023-11-21 – 2023-11-24 (×7): 3 mL via INTRAVENOUS

## 2023-11-21 MED ORDER — INSULIN ASPART 100 UNIT/ML IJ SOLN
0.0000 [IU] | Freq: Three times a day (TID) | INTRAMUSCULAR | Status: DC
  Administered 2023-11-21: 2 [IU] via SUBCUTANEOUS

## 2023-11-21 MED ORDER — AMLODIPINE BESYLATE 5 MG PO TABS
5.0000 mg | ORAL_TABLET | Freq: Every day | ORAL | Status: DC
  Administered 2023-11-21 – 2023-11-24 (×4): 5 mg via ORAL
  Filled 2023-11-21 (×4): qty 1

## 2023-11-21 MED ORDER — INSULIN ASPART 100 UNIT/ML IJ SOLN
0.0000 [IU] | Freq: Three times a day (TID) | INTRAMUSCULAR | Status: DC
  Administered 2023-11-22: 1 [IU] via SUBCUTANEOUS
  Administered 2023-11-22: 2 [IU] via SUBCUTANEOUS
  Administered 2023-11-22: 1 [IU] via SUBCUTANEOUS

## 2023-11-21 MED ORDER — IRBESARTAN 300 MG PO TABS
150.0000 mg | ORAL_TABLET | Freq: Every day | ORAL | Status: DC
  Administered 2023-11-21 – 2023-11-22 (×2): 150 mg via ORAL
  Filled 2023-11-21 (×2): qty 1

## 2023-11-21 MED ORDER — HYDRALAZINE HCL 50 MG PO TABS
100.0000 mg | ORAL_TABLET | Freq: Three times a day (TID) | ORAL | Status: DC
  Administered 2023-11-21 – 2023-11-24 (×10): 100 mg via ORAL
  Filled 2023-11-21 (×10): qty 2

## 2023-11-21 MED ORDER — INSULIN GLARGINE-YFGN 100 UNIT/ML ~~LOC~~ SOLN
9.0000 [IU] | Freq: Every day | SUBCUTANEOUS | Status: DC
  Administered 2023-11-22 – 2023-11-24 (×3): 9 [IU] via SUBCUTANEOUS
  Filled 2023-11-21 (×4): qty 0.09

## 2023-11-21 MED ORDER — FUROSEMIDE 10 MG/ML IJ SOLN
60.0000 mg | Freq: Once | INTRAMUSCULAR | Status: AC
  Administered 2023-11-21: 60 mg via INTRAVENOUS
  Filled 2023-11-21: qty 6

## 2023-11-21 MED ORDER — INSULIN ASPART 100 UNIT/ML IJ SOLN: 0.0000 [IU] | Freq: Every day | INTRAMUSCULAR | Status: DC

## 2023-11-21 MED ORDER — LATANOPROST 0.005 % OP SOLN
1.0000 [drp] | Freq: Every day | OPHTHALMIC | Status: DC
  Filled 2023-11-21 (×2): qty 2.5

## 2023-11-21 MED ORDER — EZETIMIBE 10 MG PO TABS
10.0000 mg | ORAL_TABLET | Freq: Every day | ORAL | Status: DC
  Administered 2023-11-21 – 2023-11-24 (×4): 10 mg via ORAL
  Filled 2023-11-21 (×4): qty 1

## 2023-11-21 MED ORDER — AMIODARONE HCL 200 MG PO TABS
200.0000 mg | ORAL_TABLET | Freq: Every day | ORAL | Status: DC
  Administered 2023-11-21 – 2023-11-22 (×2): 200 mg via ORAL
  Filled 2023-11-21 (×2): qty 1

## 2023-11-21 MED ORDER — CALCITRIOL 0.25 MCG PO CAPS
0.2500 ug | ORAL_CAPSULE | ORAL | Status: DC
  Administered 2023-11-21 – 2023-11-23 (×2): 0.25 ug via ORAL
  Filled 2023-11-21 (×2): qty 1

## 2023-11-21 MED ORDER — ENSURE ENLIVE PO LIQD
237.0000 mL | Freq: Two times a day (BID) | ORAL | Status: DC
  Administered 2023-11-23 – 2023-11-24 (×3): 237 mL via ORAL

## 2023-11-21 MED ORDER — ACETAMINOPHEN 325 MG PO TABS: 650.0000 mg | ORAL_TABLET | ORAL | Status: DC | PRN

## 2023-11-21 MED ORDER — LABETALOL HCL 200 MG PO TABS
200.0000 mg | ORAL_TABLET | Freq: Three times a day (TID) | ORAL | Status: DC
  Administered 2023-11-21 – 2023-11-24 (×10): 200 mg via ORAL
  Filled 2023-11-21 (×10): qty 1

## 2023-11-21 MED ORDER — HYDRALAZINE HCL 50 MG PO TABS
100.0000 mg | ORAL_TABLET | Freq: Three times a day (TID) | ORAL | Status: DC
  Administered 2023-11-21: 100 mg via ORAL
  Filled 2023-11-21 (×4): qty 2

## 2023-11-21 MED ORDER — RIVAROXABAN 15 MG PO TABS
15.0000 mg | ORAL_TABLET | Freq: Every day | ORAL | Status: DC
  Administered 2023-11-21 – 2023-11-23 (×3): 15 mg via ORAL
  Filled 2023-11-21 (×3): qty 1

## 2023-11-21 MED ORDER — TIMOLOL MALEATE 0.5 % OP SOLN
1.0000 [drp] | Freq: Two times a day (BID) | OPHTHALMIC | Status: DC
  Administered 2023-11-21 – 2023-11-24 (×6): 1 [drp] via OPHTHALMIC
  Filled 2023-11-21 (×3): qty 5

## 2023-11-21 MED ORDER — SODIUM CHLORIDE 0.9 % IV SOLN: 250.0000 mL | INTRAVENOUS | Status: AC | PRN

## 2023-11-21 NOTE — ED Triage Notes (Signed)
 Pt arrives via [facility]. Per report, [facility] was called out earlier in the night for SOB, pt saturations were mid 90's. Pt refused transport at that time. EMS was called back out to the home again, for SOB. On arrival, pt was having SOB, tachypnea, said he felt like his abdomen and legs were swelling. Initial saturations 88%, placed on [place] and then NRB without improvement in WOB. Pt placed on CPAP for transport, with improved WOB. IV established in the left [facility]. Pt has hx of CHF and was seen yesterday for elective cardioversion for afib. Given 1 nitroglycerin SL and 4 ASA during transport.    RT at bedside on arrival.

## 2023-11-21 NOTE — ED Provider Notes (Signed)
 [place] [facility] AT [facility] Provider Note   CSN: [id number] Arrival date & time: 11/21/23  0453     History  Chief Complaint  Patient presents with   Shortness of Breath    [name] [name] is a 78 y.o. male.  Patient presents to the emergency department by ambulance from home.  Patient experiencing shortness of breath.  Patient reports that he underwent elective cardioversion for atrial fibrillation during the day yesterday.  He started having left upper abdominal discomfort after the procedure.  Overnight he developed shortness of breath.  He called EMS earlier in the night and his oxygen saturations were in the low 90s so he did not except transport.  He reports through the night the breathing worsened and he called EMS back to the house and came to the ED.  Oxygen saturations in the high 80s for EMS.  They do, however, report that he had significantly increased work of breathing and therefore brought him to the ED on CPAP.       Home Medications Prior to Admission medications   Medication Sig Start Date End Date Taking? Authorizing Provider  amiodarone  (PACERONE ) 200 MG tablet Take 1 tablet (200 mg total) by mouth 2 (two) times daily for 14 days, THEN 1 tablet (200 mg total) daily. Patient taking differently: Take 1 tablet (200 mg total) daily. 09/28/23 10/11/24  [name], PA-C  amLODipine  (NORVASC ) 5 MG tablet Take 5 mg by mouth in the morning and at bedtime. 11/29/19   [provider]  B Complex Vitamins (VITAMIN B COMPLEX) TABS Take 1 tablet by mouth every morning.    [provider]  Blood Glucose Monitoring Suppl (ONETOUCH VERIO) w/Device KIT Use with existing test strips 09/22/20   Nafziger, [name], NP  calcitRIOL  (ROCALTROL ) 0.25 MCG capsule Take 1 capsule by mouth every other day. 03/19/13   [provider]  Cholecalciferol  50 MCG (2000 UT) CAPS Take 1 capsule by mouth daily.    [provider]  ezetimibe   (ZETIA ) 10 MG tablet TAKE 1 TABLET(10 MG) BY MOUTH DAILY 05/16/23   Nafziger, [name], NP  ferrous sulfate 325 (65 FE) MG tablet Take 325 mg by mouth 2 (two) times daily with a meal.    [provider]  hydrALAZINE  (APRESOLINE ) 100 MG tablet Take 100 mg by mouth 3 (three) times daily. 09/09/19   [provider]  insulin  glargine (LANTUS  SOLOSTAR) 100 UNIT/ML Solostar Pen Inject 9 Units into the skin daily.    [provider]  labetalol  (NORMODYNE ) 200 MG tablet TAKE 1 TABLET(200 MG) BY MOUTH THREE TIMES DAILY 12/20/22   Nafziger, [name], NP  latanoprost (XALATAN) 0.005 % ophthalmic solution Place 1 drop into both eyes at bedtime. 03/08/22   [provider]  nateglinide  (STARLIX ) 60 MG tablet Take 60 mg by mouth daily. 07/13/20   [provider]  ONETOUCH VERIO test strip USE TO TEST TWICE DAILY AS DIRECTED 03/17/23   Nafziger, [name], NP  Rivaroxaban  (XARELTO ) 15 MG TABS tablet Take 1 tablet (15 mg total) by mouth daily with supper. 09/14/23   [name], PA-C  timolol (TIMOPTIC) 0.5 % ophthalmic solution Place 1 drop into both eyes 2 (two) times daily. 03/08/22   [provider]  valsartan (DIOVAN) 160 MG tablet Take 80 mg by mouth 2 (two) times daily. 10/26/23   [provider]      Allergies    Evolocumab , 5-alpha reductase inhibitors, Iodinated contrast media, Penicillins, Statins,  Sulfa antibiotics, Sulfa drugs cross reactors, and Sulfamethoxazole-trimethoprim    Review of Systems   Review of Systems  Physical Exam Updated Vital Signs BP (!) 158/91 (BP Location: Right Arm)   Pulse 73   Temp 97.9 F (36.6 C)   Resp (!) 28   SpO2 100%  Physical Exam Vitals and nursing note reviewed.  Constitutional:      General: He is not in acute distress.    Appearance: He is well-developed.  HENT:     Head: Normocephalic and atraumatic.     Mouth/Throat:     Mouth: Mucous membranes are moist.  Eyes:     General: Vision grossly intact. Gaze  aligned appropriately.     Extraocular Movements: Extraocular movements intact.     Conjunctiva/sclera: Conjunctivae normal.  Cardiovascular:     Rate and Rhythm: Normal rate and regular rhythm.     Pulses: Normal pulses.     Heart sounds: Normal heart sounds, S1 normal and S2 normal. No murmur heard.    No friction rub. No gallop.  Pulmonary:     Effort: Pulmonary effort is normal. No respiratory distress.     Breath sounds: Normal breath sounds.  Abdominal:     Palpations: Abdomen is soft.     Tenderness: There is abdominal tenderness in the left upper quadrant. There is no guarding or rebound.     Hernia: No hernia is present.  Musculoskeletal:        General: No swelling.     Cervical back: Full passive range of motion without pain, normal range of motion and neck supple. No pain with movement, spinous process tenderness or muscular tenderness. Normal range of motion.     Right lower leg: Edema present.     Left lower leg: Edema present.  Skin:    General: Skin is warm and dry.     Capillary Refill: Capillary refill takes less than 2 seconds.     Findings: No ecchymosis, erythema, lesion or wound.  Neurological:     Mental Status: He is alert and oriented to person, place, and time.     GCS: GCS eye subscore is 4. GCS verbal subscore is 5. GCS motor subscore is 6.     Cranial Nerves: Cranial nerves 2-12 are intact.     Sensory: Sensation is intact.     Motor: Motor function is intact. No weakness or abnormal muscle tone.     Coordination: Coordination is intact.  Psychiatric:        Mood and Affect: Mood normal.        Speech: Speech normal.        Behavior: Behavior normal.     ED Results / Procedures / Treatments   Labs (all labs ordered are listed, but only abnormal results are displayed) Labs Reviewed  CBC WITH DIFFERENTIAL/PLATELET - Abnormal; Notable for the following components:      Result Value   Hemoglobin 11.3 (*)    HCT 33.7 (*)    MCV 79.3 (*)    All  other components within normal limits  COMPREHENSIVE METABOLIC PANEL WITH GFR - Abnormal; Notable for the following components:   CO2 19 (*)    Glucose, Bld 199 (*)    BUN 34 (*)    Creatinine, Ser 2.90 (*)    AST 14 (*)    GFR, Estimated 22 (*)    All other components within normal limits  BRAIN NATRIURETIC PEPTIDE - Abnormal; Notable for the following components:  B Natriuretic Peptide 595.2 (*)    All other components within normal limits  TROPONIN I (HIGH SENSITIVITY) - Abnormal; Notable for the following components:   Troponin I (High Sensitivity) 47 (*)    All other components within normal limits  LIPASE, BLOOD    EKG None  Radiology DG Chest Port 1 View Result Date: 11/21/2023 CLINICAL DATA:  Shortness of breath. EXAM: PORTABLE CHEST 1 VIEW COMPARISON:  08/09/2023 FINDINGS: Cardiac enlargement. Blunting of the left costophrenic angle. Mild increase interstitial markings. Patchy opacities identified within the left mid and left lower lung. The visualized osseous structures are unremarkable. IMPRESSION: 1. Cardiac enlargement with small left pleural effusion and mild interstitial edema. 2. Patchy opacities within the left mid and left lower lung compatible with pneumonia versus asymmetric edema. Electronically Signed   By: Kimberley Penman M.D.   On: 11/21/2023 05:34   EP STUDY Result Date: 11/20/2023 See surgical note for result.   Procedures Procedures    Medications Ordered in ED Medications  furosemide  (LASIX ) injection 60 mg (has no administration in time range)    ED Course/ Medical Decision Making/ A&P                                 Medical Decision Making Amount and/or Complexity of Data Reviewed External Data Reviewed: labs, radiology, ECG and notes.    Details: Echo 07/05/2022 EF 45-50% Labs: ordered. Radiology: ordered.  Risk Prescription drug management.   Patient presents to the emergency department for evaluation of shortness of breath.  Patient  underwent elective DC cardioversion yesterday.  He reports that he felt some discomfort in the left upper abdomen after the procedure.  Overnight he developed progressively worsening shortness of breath.  Patient was mildly hypoxic when EMS got to the scene.  He arrives on CPAP.  Patient continued on BiPAP.  Workup consistent with decompensated congestive heart failure.  Patient has a known decreased ejection fraction of 45 to 50%, seen in January 2024.  No recent echo.  He is not currently on diuretics.  Patient will be diuresed with IV Lasix , admit to hospital for management.  CRITICAL CARE Performed by: Ballard Bongo   Total critical care time: 30 minutes  Critical care time was exclusive of separately billable procedures and treating other patients.  Critical care was necessary to treat or prevent imminent or life-threatening deterioration.  Critical care was time spent personally by me on the following activities: development of treatment plan with patient and/or surrogate as well as nursing, discussions with consultants, evaluation of patient's response to treatment, examination of patient, obtaining history from patient or surrogate, ordering and performing treatments and interventions, ordering and review of laboratory studies, ordering and review of radiographic studies, pulse oximetry and re-evaluation of patient's condition.         Final Clinical Impression(s) / ED Diagnoses Final diagnoses:  Acute on chronic congestive heart failure, unspecified heart failure type (HCC)  Acute respiratory failure with hypoxia [facility])    Rx / DC Orders ED Discharge Orders     None         Ballard Bongo, MD 11/21/23 [phone]

## 2023-11-21 NOTE — Consult Note (Addendum)
 Cardiology Consultation   Patient ID: [name] [name] MRN: [id number]; DOB: 1945-10-03  Admit date: 11/21/2023 Date of Consult: 11/21/2023  PCP:  [name], NP   [facility] Providers Cardiologist:  [name], MD  Electrophysiologist:  [name], MD  {  Patient Profile:   [name] [name] is a 78 y.o. male with a hx of HTN, HLD, T2DM, stroke (2019), CKD stage 4, chronic combined CHF, carotid artery disease, gout, chronic prostatitis, chronic anemia who is being seen 11/21/2023 for the evaluation of acute on chronic CHF at the request of Dr. [name].  History of Present Illness:   [name] [name] has past medical history as stated above. He presented to [name] ED via EMS on 11/21/2023 complaining of shortness of breath. He underwent elective cardioversion yesterday with Dr. [name] and was feeling short of breath before that did not resolve post-procedure. He reports a weight gain of 5 lb over the weekend. EMS noted that the patient SpO2 dropped to 88% on RA which ended up requiring CPAP during transport.   Relevant workup in the ED includes: CBC and CMP stable, troponin level 47 > 52, BNP elevated at 595, CXR showed cardiomegaly, small L pleural effusion, mild interstitial edema, possible PNA vs edema. Pending updated echocardiogram. EKG showed no acute ischemic changes.   He a patient of Dr. Swaziland and Dr. [name]. She saw them both recently. Last saw Dr. Swaziland 11/06/2023 for DOE. He had previously had a low risk Myoview  and it was thought that the dyspnea was likely multifactorial with A. Fib/flutter playing a large role. Patient was referred to EP at that time for possible ablation.   He was seen by Dr. [name] on 11/15/2023 for A. Fib. The patient was noted to be fairly symptomatic from his A. Fib and it was decided that they would plan for cardioversion for immediate relief in symptoms and then plan for ablation in future, with plans for PVI + posterior wall +  CTI ablation. Patient then underwent successful cardioversion on 11/20/2023 with 200 J x 1 with conversion to NSR.   Since being admitted to the hospital under the medicine service, patient was placed on BiPAP with plans to wean, started on IV Lasix  40 mg BID, continued on amiodarone  200 mg daily, amlodipine  5 mg daily, hydralazine  100 mg TID, irbesartan 150 mg daily, labetalol  200 mg TID, Zetia  10 mg daily, Xarelto  15 mg daily. Updated echocardiogram has been ordered.   After speaking with patient and his wife present in the room, they agree with the history as stated above. He states that he has been experiencing some DOE for a couple of months but it had gotten progressively worse over the last couple of days. Last night he says he was unable to fall asleep because he wasn't able to get comfortable enough to go to bed as he felt he was unable to breathe. They report that his HR has been more controlled since his cardioversion and he has been doing well on all his medications. His wife expresses some interest in potentially changing around his medications in terms of dosing as he is on a couple TID medications which are inconvenient.   Past Medical History:  Diagnosis Date   Afib (HCC)    Anemia    Cataract    CHF (congestive heart failure) (HCC)    CKD (chronic kidney disease) stage 4, GFR 15-29 ml/min (HCC)    DIABETES MELLITUS, TYPE II 01/02/2007   HYPERLIPIDEMIA  06/18/2007   HYPERTENSION 01/02/2007   RENAL INSUFFICIENCY 02/05/2008   Stroke (HCC) 2019   Past Surgical History:  Procedure Laterality Date   CARDIOVERSION N/A 11/20/2023   Procedure: CARDIOVERSION;  Surgeon: [name], MD;  Location: [facility] INVASIVE CV LAB;  Service: Cardiovascular;  Laterality: N/A;   LOOP RECORDER INSERTION N/A 07/11/2017   Procedure: LOOP RECORDER INSERTION;  Surgeon: [name], MD;  Location: [facility] INVASIVE CV LAB;  Service: Cardiovascular;  Laterality: N/A;   SHOULDER SURGERY     left   TEE WITHOUT  CARDIOVERSION N/A 07/11/2017   Procedure: TRANSESOPHAGEAL ECHOCARDIOGRAM (TEE);  Surgeon: [name], MD;  Location: [facility] ENDOSCOPY;  Service: Cardiovascular;  Laterality: N/A;   TRANSURETHRAL RESECTION OF PROSTATE     history of retention/hematuria     Home Medications:  Prior to Admission medications   Medication Sig Start Date End Date Taking? Authorizing Provider  amiodarone  (PACERONE ) 200 MG tablet Take 1 tablet (200 mg total) by mouth 2 (two) times daily for 14 days, THEN 1 tablet (200 mg total) daily. Patient taking differently: Take 1 tablet (200 mg total) daily. 09/28/23 10/11/24 Yes [name], PA-C  amLODipine  (NORVASC ) 5 MG tablet Take 5 mg by mouth in the morning and at bedtime. 11/29/19  Yes [provider]  B Complex Vitamins (VITAMIN B COMPLEX) TABS Take 1 tablet by mouth every morning.   Yes [provider]  calcitRIOL  (ROCALTROL ) 0.25 MCG capsule Take 1 capsule by mouth every other day. 03/19/13  Yes [provider]  Cholecalciferol  50 MCG (2000 UT) CAPS Take 1 capsule by mouth daily.   Yes [provider]  ezetimibe  (ZETIA ) 10 MG tablet TAKE 1 TABLET(10 MG) BY MOUTH DAILY 05/16/23  Yes Nafziger, [name], NP  ferrous sulfate 325 (65 FE) MG tablet Take 325 mg by mouth 2 (two) times daily with a meal.   Yes [provider]  hydrALAZINE  (APRESOLINE ) 100 MG tablet Take 100 mg by mouth 3 (three) times daily. 09/09/19  Yes [provider]  insulin  glargine (LANTUS  SOLOSTAR) 100 UNIT/ML Solostar Pen Inject 9 Units into the skin daily.   Yes [provider]  labetalol  (NORMODYNE ) 200 MG tablet TAKE 1 TABLET(200 MG) BY MOUTH THREE TIMES DAILY 12/20/22  Yes Nafziger, [name], NP  latanoprost (XALATAN) 0.005 % ophthalmic solution Place 1 drop into both eyes at bedtime. 03/08/22  Yes [provider]  nateglinide  (STARLIX ) 60 MG tablet Take 60 mg by mouth daily. 07/13/20  Yes [provider]  Rivaroxaban  (XARELTO ) 15  MG TABS tablet Take 1 tablet (15 mg total) by mouth daily with supper. 09/14/23  Yes [name], PA-C  timolol (TIMOPTIC) 0.5 % ophthalmic solution Place 1 drop into both eyes 2 (two) times daily. 03/08/22  Yes [provider]  valsartan (DIOVAN) 160 MG tablet Take 80 mg by mouth 2 (two) times daily. 10/26/23  Yes [provider]  Blood Glucose Monitoring Suppl (ONETOUCH VERIO) w/Device KIT Use with existing test strips 09/22/20   Nafziger, [name], NP  [facility] VERIO test strip USE TO TEST TWICE DAILY AS DIRECTED 03/17/23   [name], NP   Inpatient Medications: Scheduled Meds:  amiodarone   200 mg Oral Daily   amLODipine   5 mg Oral Daily   calcitRIOL   0.25 mcg Oral QODAY   ezetimibe   10 mg Oral Daily   furosemide   40 mg Intravenous BID   hydrALAZINE   100 mg Oral Q8H   insulin  aspart  0-6 Units Subcutaneous TID  WC   insulin  glargine-yfgn  9 Units Subcutaneous Daily   irbesartan  150 mg Oral Daily   labetalol   200 mg Oral TID   latanoprost  1 drop Both Eyes QHS   Rivaroxaban   15 mg Oral Q supper   sodium chloride  flush  3 mL Intravenous Q12H   timolol  1 drop Both Eyes BID   Continuous Infusions:  sodium chloride      PRN Meds: sodium chloride , acetaminophen , ondansetron (ZOFRAN) IV, sodium chloride  flush  Allergies:    Allergies  Allergen Reactions   Evolocumab  Hypertension   5-Alpha Reductase Inhibitors Other (See Comments)    myalgia    Iodinated Contrast Media     Avoid due to chronic kidney disease    Penicillins Hives   Statins     myalgia    Sulfa Antibiotics Hives, Itching and Swelling   Sulfa Drugs Cross Reactors Hives, Itching and Swelling   Sulfamethoxazole-Trimethoprim Hives, Itching and Swelling   Social History:   Social History   Socioeconomic History   Marital status: Married    Spouse name: Not on file   Number of children: Not on file   Years of education: Not on file   Highest education level: Not on file  Occupational  History   Not on file  Tobacco Use   Smoking status: Never   Smokeless tobacco: Never  Vaping Use   Vaping status: Never Used  Substance and Sexual Activity   Alcohol use: No   Drug use: No   Sexual activity: Not on file  Other Topics Concern   Not on file  Social History Narrative   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 Likes to go to football games.    Social Drivers of Corporate investment banker Strain: Low Risk  (08/25/2023)   Overall Financial Resource Strain (CARDIA)    Difficulty of Paying Living Expenses: Not hard at all  Food Insecurity: No Food Insecurity (08/25/2023)   Hunger Vital Sign    Worried About Running Out of Food in the Last Year: Never true    Ran Out of Food in the Last Year: Never true  Transportation Needs: No Transportation Needs (08/25/2023)   PRAPARE - Administrator, Civil Service (Medical): No    Lack of Transportation (Non-Medical): No  Physical Activity: Inactive (08/25/2023)   Exercise Vital Sign    Days of Exercise per Week: 0 days    Minutes of Exercise per Session: 0 min  Stress: No Stress Concern Present (08/25/2023)   [employer] of Occupational Health - Occupational Stress Questionnaire    Feeling of Stress : Not at all  Social Connections: Socially Integrated (08/25/2023)   Social Connection and Isolation Panel [NHANES]    Frequency of Communication with Friends and Family: More than three times a week    Frequency of Social Gatherings with Friends and Family: More than three times a week    Attends Religious Services: More than 4 times per year    Active Member of [employer] or Organizations: Yes    Attends Banker Meetings: More than 4 times per year    Marital Status: Married  Catering manager Violence: Not At Risk (08/25/2023)   Humiliation, Afraid, Rape, and Kick questionnaire    Fear of  Current or Ex-Partner: No  Emotionally Abused: No    Physically Abused: No    Sexually Abused: No    Family History:   Family History  Problem Relation Age of Onset   Hypertension Mother    Diabetes Mother    Stroke Father    Stroke Maternal Grandmother    Colon cancer Neg Hx    Esophageal cancer Neg Hx    Rectal cancer Neg Hx    Stomach cancer Neg Hx     ROS:  Please see the history of present illness.  All other ROS reviewed and negative.     Physical Exam/Data:   Vitals:   11/21/23 1200 11/21/23 1215 11/21/23 1300 11/21/23 1345  BP: 139/81  (!) 141/90 (!) 140/76  Pulse:   65 68  Resp: (!) 22 (!) 28 (!) 24 (!) 22  Temp:    98.1 F (36.7 C)  TempSrc:    Oral  SpO2:   94% 96%    Intake/Output Summary (Last 24 hours) at 11/21/2023 1448 Last data filed at 11/21/2023 1249 Gross per 24 hour  Intake --  Output 525 ml  Net -525 ml      11/15/2023   10:22 AM 11/06/2023    8:16 AM 10/05/2023    3:31 PM  Last 3 Weights  Weight (lbs) 227 lb 8 oz 225 lb 224 lb  Weight (kg) 103.193 kg 102.059 kg 101.606 kg     There is no height or weight on file to calculate BMI.   General:  Well nourished, well developed, in no acute distress HEENT: normal Neck: + JVD Vascular: No carotid bruits; Distal pulses 2+ bilaterally Cardiac:  normal S1, S2; RRR; no murmur  Lungs:  clear to auscultation bilaterally Abd: soft, nontender Ext: 1+ bilateral pitting edema Musculoskeletal:  No deformities Skin: warm and dry  Neuro:  no focal abnormalities noted Psych:  Normal affect   EKG:  The EKG was personally reviewed and demonstrates:  sinus rhythm, HR 73, low voltage  Telemetry:  Telemetry was personally reviewed and demonstrates:  sinus rhythm, HR 70  Relevant CV Studies:  Echocardiogram, 11/21/2023 Ordered, pending updated results   Cardiac monitor, 08/25/2023 NSR with sinus brady (50/min) and sinus tachy (120/min) ave HR 70/min. PAF (12%) with rates of 79-169/min.  No VT.  NS  SVT, longest 20 beats at 121/min, likely atrial tachycardia Isolated PVC's (8.4%) and PAC's (1%).  No prolonged pauses. No symptoms recorded.  Echocardiogram 07/05/2022 Left ventricular ejection fraction, by estimation, is 45 to 50% . Left ventricular ejection fraction by 3D volume is 45 % . The left ventricle has mildly decreased function. The left ventricle demonstrates global hypokinesis. The left ventricular internal cavity size was mildly dilated. Left ventricular diastolic parameters are consistent with Grade II diastolic dysfunction ( pseudonormalization) . Elevated left ventricular end- diastolic pressure.  Right ventricular systolic function is normal. The right ventricular size is normal.  Left atrial size was severely dilated.  The mitral valve is normal in structure. Trivial mitral valve regurgitation. No evidence of mitral stenosis.  The aortic valve is tricuspid. There is moderate calcification of the aortic valve. There is moderate thickening of the aortic valve. Aortic valve regurgitation is not visualized. Aortic valve sclerosis/ calcification is present, without any evidence of aortic stenosis.  Aortic dilatation noted. There is mild dilatation of the ascending aorta, measuring 38 mm.  The inferior vena cava is dilated in size with > 50% respiratory variability, suggesting right atrial pressure of 8 mmHg.  Myoview , 06/09/2022  Findings are consistent with no prior ischemia. The study is intermediate risk.   No ST deviation was noted.   LV perfusion is abnormal. There is no evidence of ischemia. There is no evidence of infarction. Defect 1: There is a small defect with mild reduction in uptake present in the apical apex location(s) that is fixed. There is normal wall motion in the defect area. Consistent with artifact.   Left ventricular function is abnormal. Global function is mildly reduced. End diastolic cavity size is moderately enlarged. End systolic cavity size is moderately  enlarged.   Prior study not available for comparison.   Small size, mild intensity fixed apical perfusion defect, c/w apical thinning artifact. No reversible ischemia. LVEF 47% with moderately dilated LV and global hypokinesis. This is an intermediate risk study. No prior study for comparison.   Laboratory Data:  High Sensitivity Troponin:   Recent Labs  Lab 11/21/23 0510 11/21/23 0722  TROPONINIHS 47* 52*     Chemistry Recent Labs  Lab 11/15/23 1122 11/21/23 0510  NA 141 138  K 4.6 3.9  CL 105 108  CO2 19* 19*  GLUCOSE 192* 199*  BUN 30* 34*  CREATININE 3.13* 2.90*  CALCIUM 9.5 9.2  GFRNONAA  --  22*  ANIONGAP  --  11    Recent Labs  Lab 11/21/23 0510  PROT 7.2  ALBUMIN 3.8  AST 14*  ALT 10  ALKPHOS 58  BILITOT 1.2   Lipids No results for input(s): "CHOL", "TRIG", "HDL", "LABVLDL", "LDLCALC", "CHOLHDL" in the last 168 hours.  Hematology Recent Labs  Lab 11/15/23 1122 11/21/23 0510  WBC 8.3 9.6  RBC 4.27 4.25  HGB 11.6* 11.3*  HCT 35.7* 33.7*  MCV 84 79.3*  MCH 27.2 26.6  MCHC 32.5 33.5  RDW 15.2 14.9  PLT 312 296   Thyroid  No results for input(s): "TSH", "FREET4" in the last 168 hours.  BNP Recent Labs  Lab 11/21/23 0510  BNP 595.2*    DDimer No results for input(s): "DDIMER" in the last 168 hours.  Radiology/Studies:  DG Chest Port 1 View Result Date: 11/21/2023 CLINICAL DATA:  Shortness of breath. EXAM: PORTABLE CHEST 1 VIEW COMPARISON:  08/09/2023 FINDINGS: Cardiac enlargement. Blunting of the left costophrenic angle. Mild increase interstitial markings. Patchy opacities identified within the left mid and left lower lung. The visualized osseous structures are unremarkable. IMPRESSION: 1. Cardiac enlargement with small left pleural effusion and mild interstitial edema. 2. Patchy opacities within the left mid and left lower lung compatible with pneumonia versus asymmetric edema. Electronically Signed   By: Kimberley Penman M.D.   On: 11/21/2023 05:34    EP STUDY Result Date: 11/20/2023 See surgical note for result.    Assessment and Plan:   Acute on chronic combined CHF  Chronic dyspnea on exertion Hypertension  Echo from 07/2022 showed: EF 45-50%, G2DD, elevated LVEDP, normal RV function, dilated LA, mild dilation of AA at 38 mm, dilated IVC  BNP elevated at 595 CXR showed cardiomegaly, pulmonary edema Troponin level 47 > 52, likely demand in setting of acute CHF exacerbation  Creatinine 2.90 today, down from 3.31 last week  Appears mildly volume overloaded on exam  Patient reports good urine output with IV Lasix  doses so far Pending updated echocardiogram results  Currently on IV Lasix  40 mg BID, hydralazine  100 mg TID, irbesartan 150 mg daily, labetalol  200 mg TID, amlodipine  5 mg daily  Continue with IV Lasix  at this dose if patient reports good response Continue to  monitor strict I&O's, daily BMPs, and daily weights  Will keep a close eye on renal function during this admission   Persistent atrial fibrillation s/p DCCV  Recent successful cardioversion 5/19  Awaiting PVI + posterior wall + CTI ablation in future Currently in sinus rhythm with HR 70s Currently on amiodarone  200 mg daily, Xarelto  15 mg   Hyperlipidemia  03/21/2023: HDL 39.90; LDL Cholesterol 98 11/21/2023: ALT 10  Currently on Zetia  10 mg daily Reports intolerant to statins   Per primary CKD stage 4  Diabetes  History of CVA  Anemia Carotid arterial disease   Risk Assessment/Risk Scores:       New York  Heart Association (NYHA) Functional Class NYHA Class II  CHA2DS2-VASc Score = 8   This indicates a 10.8% annual risk of stroke. The patient's score is based upon: CHF History: 1 HTN History: 1 Diabetes History: 1 Stroke History: 2 Vascular Disease History: 1 Age Score: 2 Gender Score: 0         For questions or updates, please contact [facility] Please consult [email or web address] for contact info under    Signed, Jiles Mote, PA-C  11/21/2023 2:48 PM   Patient seen and examined.  Agree with above documentation.  [name] [name] is a 78 year old male with a history of atrial fibrillation, CVA, CKD stage IV, chronic combined heart failure, T2DM, hypertension who we are consulted for evaluation of heart failure at the request of Dr. [name].  He underwent cardioversion for A-fib yesterday.  Reports worsening shortness of breath, prompting presentation to ED today.  In the ED, vital signs notable for BP 158/91, pulse 73.  Initially required BiPAP, now weaned to room air.  Labs notable for creatinine 2.9, was 3.1 on 5/14, BNP 595, troponin 47 > 52, procalcitonin undetectable, hemoglobin 11.3, WBC 9.6.  EKG shows sinus rhythm, rate 76, low voltage, poor R wave progression.  Echocardiogram shows EF 30 to 35%, grade 3 diastolic dysfunction, normal RV function, RVSP 42, severe biatrial enlargement, small pericardial effusion, RAP 15.  Prior echo 07/2022 showed EF 45 to 50%.  On exam, patient is alert and oriented, regular rate and rhythm, no murmurs, lungs CTAB, 1+ lower extremity edema, + JVD.  For his acute on chronic combined heart failure, he appears volume overloaded, would continue IV Lasix .  Closely monitor renal function.  Will add GDMT as able, limited by renal function.  Suspected worsening systolic function is tachycardia induced, he is status post cardioversion yesterday.  Can plan repeat echocardiogram in 3 months, if not improving could consider ischemic evaluation, likely stress PET given renal function.  Wendie Hamburg, MD

## 2023-11-21 NOTE — Progress Notes (Signed)
 Patient removed from bipap and on RA at this time.  Vitals, and sats, are stable and within normal limits.  Will continue to monitor and assess for bipap needs.

## 2023-11-21 NOTE — Progress Notes (Signed)
  Echocardiogram 2D Echocardiogram has been performed.  Fain Home RDCS 11/21/2023, 11:26 AM

## 2023-11-21 NOTE — H&P (Addendum)
 History and Physical    Patient: [name] [name]:[id number] DOB: 09-03-1945 DOA: 11/21/2023 DOS: the patient was seen and examined on 11/21/2023 PCP: [name], NP  Patient coming from: Home via EMS  Chief Complaint:  Chief Complaint  Patient presents with   Shortness of Breath   HPI: [name] [name] is a 78 y.o. male with medical history significant of hypertension, hyperlipidemia, CHF, atrial fibrillation on Xarelto , CVA, diabetes mellitus type 2, and chronic kidney disease stage IV who presents with shortness of breath.  Patient had just underwent cardioversion with Dr. [name] on 5/19. He did not feel well before the procedure, experiencing shortness of breath and exhaustion.  Following the procedure patient reported symptoms did not seem to improve.  Also reported some abdominal discomfort following the procedure.  He had been having palpitations, but stated that those have resolved following cardioversion. Over the weekend the patient had reported weight gain of approximately five pounds, but is not on any diuretics normally.    En route with EMS O2 saturations noted to be as low as 88% for which patient was placed on CPAP prior to transport with improvement in work of breathing.  He had also received 1 sublingual nitroglycerin and 324 mg of aspirin .  In the ED patient was noted to be afebrile with tachypnea and blood pressures elevated up to 159/89.  O2 saturations were maintained on BiPAP.  Labs significant for BNP 595.2, high-sensitivity troponin 47, BUN 34, creatinine 2.9(baseline 3-3.1), and glucose 199.  Chest x-ray noted cardiac enlargement with small left pleural effusion, mild interstitial edema, and patchy opacities within the left mid and left lower lung concerning for pneumonia versus atypical edema.  Patient had been given Lasix  60 mg IV x 1 dose. He has been given Lasix  and has urinated twice since administration, noting some improvement in breathing.  Review of  Systems: As mentioned in the history of present illness. All other systems reviewed and are negative. Past Medical History:  Diagnosis Date   Afib (HCC)    Anemia    Cataract    CHF (congestive heart failure) (HCC)    CKD (chronic kidney disease) stage 4, GFR 15-29 ml/min (HCC)    DIABETES MELLITUS, TYPE II 01/02/2007   HYPERLIPIDEMIA 06/18/2007   HYPERTENSION 01/02/2007   RENAL INSUFFICIENCY 02/05/2008   Stroke (HCC) 2019   Past Surgical History:  Procedure Laterality Date   CARDIOVERSION N/A 11/20/2023   Procedure: CARDIOVERSION;  Surgeon: [name], MD;  Location: [facility] IN[facility]ASIVE CV [facility];  Service: Cardiovascular;  Laterality: N/A;   LOOP RECORDER INSERTION N/A 07/11/2017   Procedure: LOOP RECORDER INSERTION;  Surgeon: [name], MD;  Location: [facility]AS[facility] CV [facility];  Service: Cardiovascular;  Laterality: N/A;   SHOULDER SURGERY     left   TEE WITHOUT CARDIOVERSION N/A 07/11/2017   Procedure: TRANSESOPHAGEAL ECHOCARDIOGRAM (TEE);  Surgeon: [name], MD;  Location: [facility] ENDOSCOPY;  Service: Cardiovascular;  Laterality: N/A;   TRANSURETHRAL RESECTION OF PROSTATE     history of retention/hematuria    Social History:  reports that he has never smoked. He has never used smokeless tobacco. He reports that he does not drink alcohol and does not use drugs.  Allergies  Allergen Reactions   Evolocumab  Hypertension   5-Alpha Reductase Inhibitors Other (See Comments)    myalgia    Iodinated Contrast Media     Avoid due to chronic kidney disease    Penicillins Hives   Statins     myalgia  Sulfa Antibiotics Hives, Itching and Swelling   Sulfa Drugs Cross Reactors Hives, Itching and Swelling   Sulfamethoxazole-Trimethoprim Hives, Itching and Swelling    Family History  Problem Relation Age of Onset   Hypertension Mother    Diabetes Mother    Stroke Father    Stroke Maternal Grandmother    Colon cancer Neg Hx    Esophageal cancer Neg Hx    Rectal cancer Neg Hx     Stomach cancer Neg Hx     Prior to Admission medications   Medication Sig Start Date End Date Taking? Authorizing Provider  amiodarone  (PACERONE ) 200 MG tablet Take 1 tablet (200 mg total) by mouth 2 (two) times daily for 14 days, THEN 1 tablet (200 mg total) daily. Patient taking differently: Take 1 tablet (200 mg total) daily. 09/28/23 10/11/24  [name], PA-C  amLODipine  (NORVASC ) 5 MG tablet Take 5 mg by mouth in the morning and at bedtime. 11/29/19   [provider]  B Complex Vitamins (VITAMIN B COMPLEX) TABS Take 1 tablet by mouth every morning.    [provider]  Blood Glucose Monitoring Suppl (ONETOUCH VERIO) w/Device KIT Use with existing test strips 09/22/20   Nafziger, [name], NP  calcitRIOL  (ROCALTROL ) 0.25 MCG capsule Take 1 capsule by mouth every other day. 03/19/13   [provider]  Cholecalciferol  50 MCG (2000 UT) CAPS Take 1 capsule by mouth daily.    [provider]  ezetimibe  (ZETIA ) 10 MG tablet TAKE 1 TABLET(10 MG) BY MOUTH DAILY 05/16/23   Nafziger, [name], NP  ferrous sulfate 325 (65 FE) MG tablet Take 325 mg by mouth 2 (two) times daily with a meal.    [provider]  hydrALAZINE  (APRESOLINE ) 100 MG tablet Take 100 mg by mouth 3 (three) times daily. 09/09/19   [provider]  insulin  glargine (LANTUS  SOLOSTAR) 100 UNIT/ML Solostar Pen Inject 9 Units into the skin daily.    [provider]  labetalol  (NORMODYNE ) 200 MG tablet TAKE 1 TABLET(200 MG) BY MOUTH THREE TIMES DAILY 12/20/22   Nafziger, [name], NP  latanoprost (XALATAN) 0.005 % ophthalmic solution Place 1 drop into both eyes at bedtime. 03/08/22   [provider]  nateglinide  (STARLIX ) 60 MG tablet Take 60 mg by mouth daily. 07/13/20   [provider]  ONETOUCH VERIO test strip USE TO TEST TWICE DAILY AS DIRECTED 03/17/23   Nafziger, [name], NP  Rivaroxaban  (XARELTO ) 15 MG TABS tablet Take 1 tablet (15 mg total) by mouth daily with supper.  09/14/23   [name], PA-C  timolol (TIMOPTIC) 0.5 % ophthalmic solution Place 1 drop into both eyes 2 (two) times daily. 03/08/22   [provider]  valsartan (DIOVAN) 160 MG tablet Take 80 mg by mouth 2 (two) times daily. 10/26/23   [provider]    Physical Exam: Vitals:   11/21/23 0505 11/21/23 0600 11/21/23 0630 11/21/23 0700  BP: (!) 158/91 (!) 159/89 (!) 155/92 (!) 158/95  Pulse: 73 67 73 72  Resp: (!) 28 (!) 23 (!) 25 (!) 24  Temp: 97.9 F (36.6 C)     SpO2: 100% 100% 99% 99%    Constitutional: Elderly male who appears to be in no acute distress at this time. Eyes: PERRL, lids and conjunctivae normal ENMT: Mucous membranes are moist. Posterior pharynx clear of any exudate or lesions.Normal dentition.  Neck: normal, supple.  JVD present. Respiratory: clear to auscultation bilaterally, no wheezing, no crackles. Normal respiratory effort. No  accessory muscle use.  Cardiovascular: Regular rate and rhythm, no murmurs / rubs / gallops.  1+ pitting lower extremity edema.. 2+ pedal pulses. No carotid bruits.  Abdomen: no tenderness, no masses palpated. Bowel sounds positive.  Musculoskeletal: no clubbing / cyanosis. No joint deformity upper and lower extremities. Good ROM, no contractures. Normal muscle tone.  Skin: no rashes, lesions, ulcers. No induration Neurologic: CN 2-12 grossly intact. Sensation intact, DTR normal. Strength 5/5 in all 4.  Psychiatric: Normal judgment and insight. Alert and oriented x 3. Normal mood.   Data Reviewed:  EKG revealed a sinus rhythm at 74 bpm with first-degree heart block and left atrial enlargement.  Reviewed labs, imaging, and pertinent records as documented.  Assessment and Plan:  Acute respiratory failure with hypoxia secondary to heart failure with mildly reduced ejection fraction Patient presented with complaints of shortness of breath.  Found to be hypoxic down to 88% for which she was ultimately placed on BiPAP.   Chest x-ray showing concern for pulmonary edema with left-sided opacities that may be pneumonia versus edema.  BNP elevated at 559.2.  Last echocardiogram revealed EF to be 45 to 50% with grade 2 diastolic dysfunction patient was given Lasix  60 mg IV x 1 dose while in the ED with output of approximately 1 L. - Admit to a progressive bed - Continuous pulse oximetry with oxygen maintain O2 saturation greater than 92%  - BiPAP wean off as tolerated - Check I&O's and daily weights - Check procalcitonin(<0.1.  Low suspicion for infection) - Check echo - Lasix  40 mg IV twice daily - Continue ARB - Cardiology consulted, will follow-up for any further recommendations  Elevated troponin High-sensitivity troponins noted to be 47->52.  Previously high-sensitivity troponins have been within normal limits.  EKG appears similar to priors.  Previous stress test done in 06/2022 was noted to be normal.  Possibly secondary to demand in setting of patient being acutely fluid overloaded. - Follow-up echocardiogram  Paroxysmal atrial fibrillation on chronic anticoagulation Patient had just underwent electrical cardioversion yesterday with Dr. [name].  Currently appears to be in sinus rhythm with occasional PVCs. - Continue amiodarone  and Xarelto  - Goal potassium at least 4 and magnesium at least 2.  Replace electrolytes to goal  Essential hypertension Blood pressures currently elevated 152/97-159/94. - Continue amlodipine , hydralazine , labetalol , and valsartan  Diabetes mellitus type 2, with long-term use of insulin  On admission glucose noted to be 199.  Last hemoglobin A1c noted to be 6.9 when checked 09/23/2023.  Home medication regimen includes Lantus  9 units daily and nateglinide  - Hypoglycemic protocols - Held nateglinide  - Continue pharmacy substitution for Lantus  - CBGs before every meal with very sensitive SSI  - Adjust insulin  regimen as needed  Chronic kidney disease stage IV Creatinine noted  to be 2.9 which appears around patient's baseline of 3-3.1. - Continue to monitor kidney function with diuresis  Normocytic anemia Hemoglobin 11.3 which appears around patient's baseline which ranges from 11-12. - Continue to monitor  Obesity BMI 30.85 kg/m  DVT prophylaxis: Xarelto   Advance Care Planning:   Code Status: Full Code    Consults: Cardiology  Family Communication: Wife updated at bedside  Severity of Illness: The appropriate patient status for this patient is OBSERVATION. Observation status is judged to be reasonable and necessary in order to provide the required intensity of service to ensure the patient's safety. The patient's presenting symptoms, physical exam findings, and initial radiographic and laboratory data in the context of their medical condition is  felt to place them at decreased risk for further clinical deterioration. Furthermore, it is anticipated that the patient will be medically stable for discharge from the hospital within 2 midnights of admission.   Author: Lena Qualia, MD 11/21/2023 8:04 AM  For on call review [email or web address].

## 2023-11-21 NOTE — ED Notes (Signed)
 RT called to start weaning patient off BIPAP.

## 2023-11-22 ENCOUNTER — Ambulatory Visit: Admitting: Emergency Medicine

## 2023-11-22 ENCOUNTER — Other Ambulatory Visit: Payer: Self-pay

## 2023-11-22 DIAGNOSIS — N184 Chronic kidney disease, stage 4 (severe): Secondary | ICD-10-CM | POA: Diagnosis not present

## 2023-11-22 DIAGNOSIS — I5043 Acute on chronic combined systolic (congestive) and diastolic (congestive) heart failure: Secondary | ICD-10-CM | POA: Diagnosis not present

## 2023-11-22 DIAGNOSIS — I4819 Other persistent atrial fibrillation: Secondary | ICD-10-CM | POA: Diagnosis not present

## 2023-11-22 DIAGNOSIS — E66811 Obesity, class 1: Secondary | ICD-10-CM | POA: Diagnosis present

## 2023-11-22 DIAGNOSIS — Z8249 Family history of ischemic heart disease and other diseases of the circulatory system: Secondary | ICD-10-CM | POA: Diagnosis not present

## 2023-11-22 DIAGNOSIS — E1122 Type 2 diabetes mellitus with diabetic chronic kidney disease: Secondary | ICD-10-CM | POA: Diagnosis present

## 2023-11-22 DIAGNOSIS — R7989 Other specified abnormal findings of blood chemistry: Secondary | ICD-10-CM | POA: Diagnosis not present

## 2023-11-22 DIAGNOSIS — Z881 Allergy status to other antibiotic agents status: Secondary | ICD-10-CM | POA: Diagnosis not present

## 2023-11-22 DIAGNOSIS — F419 Anxiety disorder, unspecified: Secondary | ICD-10-CM | POA: Diagnosis present

## 2023-11-22 DIAGNOSIS — Z794 Long term (current) use of insulin: Secondary | ICD-10-CM | POA: Diagnosis not present

## 2023-11-22 DIAGNOSIS — I472 Ventricular tachycardia, unspecified: Secondary | ICD-10-CM | POA: Diagnosis not present

## 2023-11-22 DIAGNOSIS — I130 Hypertensive heart and chronic kidney disease with heart failure and stage 1 through stage 4 chronic kidney disease, or unspecified chronic kidney disease: Secondary | ICD-10-CM | POA: Diagnosis not present

## 2023-11-22 DIAGNOSIS — N179 Acute kidney failure, unspecified: Secondary | ICD-10-CM | POA: Diagnosis not present

## 2023-11-22 DIAGNOSIS — I483 Typical atrial flutter: Secondary | ICD-10-CM | POA: Diagnosis present

## 2023-11-22 DIAGNOSIS — Z882 Allergy status to sulfonamides status: Secondary | ICD-10-CM | POA: Diagnosis not present

## 2023-11-22 DIAGNOSIS — Z6830 Body mass index (BMI) 30.0-30.9, adult: Secondary | ICD-10-CM | POA: Diagnosis not present

## 2023-11-22 DIAGNOSIS — E876 Hypokalemia: Secondary | ICD-10-CM | POA: Diagnosis present

## 2023-11-22 DIAGNOSIS — Z833 Family history of diabetes mellitus: Secondary | ICD-10-CM | POA: Diagnosis not present

## 2023-11-22 DIAGNOSIS — E785 Hyperlipidemia, unspecified: Secondary | ICD-10-CM | POA: Diagnosis present

## 2023-11-22 DIAGNOSIS — Z79899 Other long term (current) drug therapy: Secondary | ICD-10-CM | POA: Diagnosis not present

## 2023-11-22 DIAGNOSIS — I5023 Acute on chronic systolic (congestive) heart failure: Secondary | ICD-10-CM | POA: Diagnosis not present

## 2023-11-22 DIAGNOSIS — Z888 Allergy status to other drugs, medicaments and biological substances status: Secondary | ICD-10-CM | POA: Diagnosis not present

## 2023-11-22 DIAGNOSIS — Z7901 Long term (current) use of anticoagulants: Secondary | ICD-10-CM | POA: Diagnosis not present

## 2023-11-22 DIAGNOSIS — D631 Anemia in chronic kidney disease: Secondary | ICD-10-CM | POA: Diagnosis present

## 2023-11-22 DIAGNOSIS — J9601 Acute respiratory failure with hypoxia: Secondary | ICD-10-CM | POA: Diagnosis not present

## 2023-11-22 DIAGNOSIS — I2489 Other forms of acute ischemic heart disease: Secondary | ICD-10-CM | POA: Diagnosis not present

## 2023-11-22 LAB — MAGNESIUM: Magnesium: 1.6 mg/dL — ABNORMAL LOW (ref 1.7–2.4)

## 2023-11-22 LAB — GLUCOSE, CAPILLARY
Glucose-Capillary: 172 mg/dL — ABNORMAL HIGH (ref 70–99)
Glucose-Capillary: 207 mg/dL — ABNORMAL HIGH (ref 70–99)
Glucose-Capillary: 197 mg/dL — ABNORMAL HIGH (ref 70–99)
Glucose-Capillary: 269 mg/dL — ABNORMAL HIGH (ref 70–99)

## 2023-11-22 LAB — BASIC METABOLIC PANEL WITH GFR
Anion gap: 13 (ref 5–15)
BUN: 35 mg/dL — ABNORMAL HIGH (ref 8–23)
CO2: 22 mmol/L (ref 22–32)
Calcium: 9.2 mg/dL (ref 8.9–10.3)
Chloride: 106 mmol/L (ref 98–111)
Creatinine, Ser: 2.96 mg/dL — ABNORMAL HIGH (ref 0.61–1.24)
GFR, Estimated: 21 mL/min — ABNORMAL LOW (ref >60)
Glucose, Bld: 176 mg/dL — ABNORMAL HIGH (ref 70–99)
Potassium: 3.5 mmol/L (ref 3.5–5.1)
Sodium: 141 mmol/L (ref 135–145)

## 2023-11-22 MED ORDER — AMIODARONE HCL IN DEXTROSE 360-4.14 MG/200ML-% IV SOLN
60.0000 mg/h | INTRAVENOUS | Status: AC
  Administered 2023-11-22 (×2): 60 mg/h via INTRAVENOUS
  Filled 2023-11-22 (×2): qty 200

## 2023-11-22 MED ORDER — MAGNESIUM SULFATE 2 GM/50ML IV SOLN
2.0000 g | Freq: Once | INTRAVENOUS | Status: AC
  Administered 2023-11-22: 2 g via INTRAVENOUS
  Filled 2023-11-22: qty 50

## 2023-11-22 MED ORDER — AMIODARONE HCL IN DEXTROSE 360-4.14 MG/200ML-% IV SOLN
30.0000 mg/h | INTRAVENOUS | Status: DC
  Administered 2023-11-23 (×2): 30 mg/h via INTRAVENOUS
  Filled 2023-11-22: qty 200

## 2023-11-22 MED ORDER — AMIODARONE LOAD VIA INFUSION
150.0000 mg | Freq: Once | INTRAVENOUS | Status: AC
  Administered 2023-11-22: 150 mg via INTRAVENOUS
  Filled 2023-11-22: qty 83.34

## 2023-11-22 MED ORDER — METOPROLOL TARTRATE 5 MG/5ML IV SOLN
2.5000 mg | Freq: Four times a day (QID) | INTRAVENOUS | Status: DC | PRN
  Administered 2023-11-22: 2.5 mg via INTRAVENOUS
  Filled 2023-11-22: qty 5

## 2023-11-22 MED ORDER — INSULIN ASPART 100 UNIT/ML IJ SOLN
0.0000 [IU] | Freq: Three times a day (TID) | INTRAMUSCULAR | Status: DC
  Administered 2023-11-22: 3 [IU] via SUBCUTANEOUS
  Administered 2023-11-23: 1 [IU] via SUBCUTANEOUS
  Administered 2023-11-23: 3 [IU] via SUBCUTANEOUS
  Administered 2023-11-23: 1 [IU] via SUBCUTANEOUS
  Administered 2023-11-23: 2 [IU] via SUBCUTANEOUS
  Administered 2023-11-23: 1 [IU] via SUBCUTANEOUS
  Administered 2023-11-24: 3 [IU] via SUBCUTANEOUS
  Administered 2023-11-24: 1 [IU] via SUBCUTANEOUS

## 2023-11-22 NOTE — Care Management Obs Status (Signed)
 MEDICARE OBSERVATION STATUS NOTIFICATION   Patient Details  Name: [name] [name] MRN: [id number] Date of Birth: April 27, 1946   Medicare Observation Status Notification Given:  Yes  Obs/Moon letter signed and copy given  Wynonia Hedges 11/22/2023, 9:44 AM

## 2023-11-22 NOTE — Plan of Care (Signed)
  Problem: Fluid Volume: Goal: Ability to maintain a balanced intake and output will improve Outcome: Progressing   Problem: Metabolic: Goal: Ability to maintain appropriate glucose levels will improve Outcome: Progressing   Problem: Nutritional: Goal: Maintenance of adequate nutrition will improve Outcome: Progressing   Problem: Nutritional: Goal: Progress toward achieving an optimal weight will improve Outcome: Progressing   Problem: Skin Integrity: Goal: Risk for impaired skin integrity will decrease Outcome: Progressing   Problem: Tissue Perfusion: Goal: Adequacy of tissue perfusion will improve Outcome: Progressing

## 2023-11-22 NOTE — Progress Notes (Addendum)
 Rounding Note    Patient Name: [name] [name] Date of Encounter: 11/22/2023  [facility] Cardiologist: [name], MD   Subjective   Net -900 cc yesterday, creatinine stable at 2.96.  Reports dyspnea improving  Inpatient Medications    Scheduled Meds:  amiodarone   200 mg Oral Daily   amLODipine   5 mg Oral Daily   calcitRIOL   0.25 mcg Oral QODAY   ezetimibe   10 mg Oral Daily   feeding supplement  237 mL Oral BID BM   furosemide   40 mg Intravenous BID   hydrALAZINE   100 mg Oral Q8H   insulin  aspart  0-6 Units Subcutaneous TID WC   insulin  glargine-yfgn  9 Units Subcutaneous Daily   irbesartan  150 mg Oral Daily   labetalol   200 mg Oral TID   latanoprost  1 drop Both Eyes QHS   Rivaroxaban   15 mg Oral Q supper   sodium chloride  flush  3 mL Intravenous Q12H   timolol  1 drop Both Eyes BID   Continuous Infusions:  magnesium sulfate bolus IVPB     PRN Meds: acetaminophen , ondansetron (ZOFRAN) IV, sodium chloride  flush   Vital Signs    Vitals:   11/22/23 0054 11/22/23 0456 11/22/23 0825 11/22/23 0935  BP: (!) 148/80 (!) 152/82 (!) 123/53 (!) 132/53  Pulse: 73 77 79   Resp: 20 20 19    Temp: 99.2 F (37.3 C) (!) 100.7 F (38.2 C) 99 F (37.2 C)   TempSrc: Oral Oral Oral   SpO2: 98% 100% 97%   Weight:  98.5 kg    Height:        Intake/Output Summary (Last 24 hours) at 11/22/2023 1021 Last data filed at 11/22/2023 0455 Gross per 24 hour  Intake 600 ml  Output 1225 ml  Net -625 ml      11/22/2023    4:56 AM 11/21/2023    3:00 PM 11/15/2023   10:22 AM  Last 3 Weights  Weight (lbs) 217 lb 3.2 oz 224 lb 6.9 oz 227 lb 8 oz  Weight (kg) 98.521 kg 101.8 kg 103.193 kg      Telemetry    Normal sinus rhythm, PVCs- Personally Reviewed  ECG    No new ECG- Personally Reviewed  Physical Exam   GEN: No acute distress.   Neck: + JVD Cardiac: RRR, no murmurs, rubs, or gallops.  Respiratory: Diminished breath sounds GI: Soft, nontender,  non-distended  MS: Trace edema; No deformity. Neuro:  Nonfocal  Psych: Normal affect   Labs    High Sensitivity Troponin:   Recent Labs  Lab 11/21/23 0510 11/21/23 0722  TROPONINIHS 47* 52*     Chemistry Recent Labs  Lab 11/15/23 1122 11/21/23 0510 11/22/23 0222  NA 141 138 141  K 4.6 3.9 3.5  CL 105 108 106  CO2 19* 19* 22  GLUCOSE 192* 199* 176*  BUN 30* 34* 35*  CREATININE 3.13* 2.90* 2.96*  CALCIUM 9.5 9.2 9.2  MG  --   --  1.6*  PROT  --  7.2  --   ALBUMIN  --  3.8  --   AST  --  14*  --   ALT  --  10  --   ALKPHOS  --  58  --   BILITOT  --  1.2  --   GFRNONAA  --  22* 21*  ANIONGAP  --  11 13    Lipids No results for input(s): "CHOL", "TRIG", "HDL", "LABVLDL", "LDLCALC", "  CHOLHDL" in the last 168 hours.  Hematology Recent Labs  Lab 11/15/23 1122 11/21/23 0510  WBC 8.3 9.6  RBC 4.27 4.25  HGB 11.6* 11.3*  HCT 35.7* 33.7*  MCV 84 79.3*  MCH 27.2 26.6  MCHC 32.5 33.5  RDW 15.2 14.9  PLT 312 296   Thyroid  No results for input(s): "TSH", "FREET4" in the last 168 hours.  BNP Recent Labs  Lab 11/21/23 0510  BNP 595.2*    DDimer No results for input(s): "DDIMER" in the last 168 hours.   Radiology    ECHOCARDIOGRAM COMPLETE Result Date: 11/21/2023    ECHOCARDIOGRAM REPORT   Patient Name:   ALLON COSTLOW Date of Exam: 11/21/2023 Medical Rec #:  [id number]        Height:       72.0 in Accession #:    [id number]       Weight:       227.5 lb Date of Birth:  [birth year]        BSA:          2.250 m Patient Age:    78 years         BP:           145/95 mmHg Patient Gender: M                HR:           71 bpm. Exam Location:  Inpatient Procedure: 2D Echo, Color Doppler and Cardiac Doppler (Both Spectral and Color            Flow Doppler were utilized during procedure). Indications:    CHF-Acute Diastolic I50.31  History:        Patient has no prior history of Echocardiogram examinations,                 most recent 07/05/2022.  Sonographer:    Hersey Lorenzo  RDCS Referring Phys: [phone] RONDELL A SMITH IMPRESSIONS  1. Left ventricular ejection fraction, by estimation, is 30 to 35%. The left ventricle has moderately decreased function. The left ventricle demonstrates global hypokinesis. The left ventricular internal cavity size was mildly dilated. There is mild concentric left ventricular hypertrophy. Left ventricular diastolic parameters are consistent with Grade III diastolic dysfunction (restrictive). Elevated left atrial pressure.  2. Right ventricular systolic function is normal. The right ventricular size is mildly enlarged. There is mildly elevated pulmonary artery systolic pressure. The estimated right ventricular systolic pressure is 42.0 mmHg.  3. Left atrial size was severely dilated.  4. Right atrial size was severely dilated.  5. A small pericardial effusion is present. The pericardial effusion is circumferential. There is no evidence of cardiac tamponade.  6. The mitral valve is normal in structure. No evidence of mitral valve regurgitation. No evidence of mitral stenosis.  7. The aortic valve is tricuspid. There is mild calcification of the aortic valve. There is mild thickening of the aortic valve. Aortic valve regurgitation is not visualized. Aortic valve sclerosis/calcification is present, without any evidence of aortic stenosis.  8. The inferior vena cava is dilated in size with <50% respiratory variability, suggesting right atrial pressure of 15 mmHg. Comparison(s): Note low QRS complex voltage on ECG. Consider restrictive/infiltrative cardiomyopathy such as cardiac amyloidosis, probably with superimposed tachycardia cardiomyopathy, as well as possible mechanical atrial stunning shortly after cardioversion. FINDINGS  Left Ventricle: Left ventricular ejection fraction, by estimation, is 30 to 35%. The left ventricle has moderately decreased function. The left ventricle demonstrates global hypokinesis.  The left ventricular internal cavity size was  mildly dilated. There is mild concentric left ventricular hypertrophy. Left ventricular diastolic parameters are consistent with Grade III diastolic dysfunction (restrictive). Elevated left atrial pressure. Right Ventricle: The right ventricular size is mildly enlarged. No increase in right ventricular wall thickness. Right ventricular systolic function is normal. There is mildly elevated pulmonary artery systolic pressure. The tricuspid regurgitant velocity is 2.60 m/s, and with an assumed right atrial pressure of 15 mmHg, the estimated right ventricular systolic pressure is 42.0 mmHg. Left Atrium: Left atrial size was severely dilated. Right Atrium: Right atrial size was severely dilated. Pericardium: A small pericardial effusion is present. The pericardial effusion is circumferential. There is no evidence of cardiac tamponade. Mitral Valve: The mitral valve is normal in structure. No evidence of mitral valve regurgitation. No evidence of mitral valve stenosis. Tricuspid Valve: The tricuspid valve is normal in structure. Tricuspid valve regurgitation is mild. Aortic Valve: The aortic valve is tricuspid. There is mild calcification of the aortic valve. There is mild thickening of the aortic valve. Aortic valve regurgitation is not visualized. Aortic valve sclerosis/calcification is present, without any evidence of aortic stenosis. Pulmonic Valve: The pulmonic valve was grossly normal. Pulmonic valve regurgitation is not visualized. No evidence of pulmonic stenosis. Aorta: The aortic root and ascending aorta are structurally normal, with no evidence of dilitation. Venous: The inferior vena cava is dilated in size with less than 50% respiratory variability, suggesting right atrial pressure of 15 mmHg. IAS/Shunts: No atrial level shunt detected by color flow Doppler.  LEFT VENTRICLE PLAX 2D LVIDd:         5.80 cm   Diastology LVIDs:         4.90 cm   LV e' medial:    4.24 cm/s LV PW:         1.30 cm   LV E/e' medial:   23.1 LV IVS:        1.30 cm   LV e' lateral:   4.57 cm/s LVOT diam:     2.30 cm   LV E/e' lateral: 21.4 LV SV:         85 LV SV Index:   38 LVOT Area:     4.15 cm  RIGHT VENTRICLE             IVC RV S prime:     15.60 cm/s  IVC diam: 2.90 cm TAPSE (M-mode): 2.4 cm LEFT ATRIUM              Index        RIGHT ATRIUM           Index LA diam:        5.30 cm  2.36 cm/m   RA Area:     24.20 cm LA Vol (A2C):   163.0 ml 72.43 ml/m  RA Volume:   77.10 ml  34.26 ml/m LA Vol (A4C):   160.0 ml 71.10 ml/m LA Biplane Vol: 165.0 ml 73.32 ml/m  AORTIC VALVE LVOT Vmax:   97.80 cm/s LVOT Vmean:  70.300 cm/s LVOT VTI:    0.205 m  AORTA Ao Root diam: 3.10 cm Ao Asc diam:  3.40 cm MITRAL VALVE               TRICUSPID VALVE MV Area (PHT): 5.58 cm    TR Peak grad:   27.0 mmHg MV Decel Time: 136 msec    TR Vmax:        260.00 cm/s MV  E velocity: 97.90 cm/s MV A velocity: 39.90 cm/s  SHUNTS MV E/A ratio:  2.45        Systemic VTI:  0.20 m                            Systemic Diam: 2.30 cm [name] MD Electronically signed by [name] MD Signature Date/Time: 11/21/2023/3:13:04 PM    Final    DG Chest Port 1 View Result Date: 11/21/2023 CLINICAL DATA:  Shortness of breath. EXAM: PORTABLE CHEST 1 VIEW COMPARISON:  08/09/2023 FINDINGS: Cardiac enlargement. Blunting of the left costophrenic angle. Mild increase interstitial markings. Patchy opacities identified within the left mid and left lower lung. The visualized osseous structures are unremarkable. IMPRESSION: 1. Cardiac enlargement with small left pleural effusion and mild interstitial edema. 2. Patchy opacities within the left mid and left lower lung compatible with pneumonia versus asymmetric edema. Electronically Signed   By: Kimberley Penman M.D.   On: 11/21/2023 05:34    Cardiac Studies     Patient Profile     78 y.o. male with a hx of HTN, HLD, T2DM, stroke (2019), CKD stage 4, chronic combined CHF, carotid artery disease, gout, chronic prostatitis, chronic  anemia who is being seen 11/21/2023 for the evaluation of acute on chronic CHF   Assessment & Plan    Acute on chronic combined CHF  Chronic dyspnea on exertion Hypertension  Echo from 07/2022 showed: EF 45-50%.  Echo this admission shows EF 30 to 35% BNP elevated at 595 CXR showed cardiomegaly, pulmonary edema Troponin level 47 > 52, likely demand in setting of acute CHF exacerbation  Creatinine 2.96 today, down from 3.31 last week  Appears mildly volume overloaded on exam  Suspect worsening systolic function is tachycardia induced.  He underwent successful cardioversion on 5/19.  Not currently candidate for invasive evaluation given recent cardioversion and his CKD stage IV Currently on IV Lasix  40 mg BID, hydralazine  100 mg TID, irbesartan 150 mg daily, labetalol  200 mg TID, amlodipine  5 mg daily  Continue with IV Lasix   Continue to monitor strict I&O's, daily BMPs, and daily weights  Will keep a close eye on renal function during this admission  Replete K greater than 4, mag greater than 2   Persistent atrial fibrillation s/p DCCV  Recent successful cardioversion 5/19  Awaiting PVI + posterior wall + CTI ablation in future Currently maintaining sinus rhythm Currently on amiodarone  200 mg daily, Xarelto  15 mg   Addendum: went back into Afib with RVR this afternoon.  Will start amio gtt.   Hyperlipidemia  03/21/2023: HDL 39.90; LDL Cholesterol 98 11/21/2023: ALT 10  Currently on Zetia  10 mg daily Reports intolerant to statins    Per primary CKD stage 4  Diabetes  History of CVA  Anemia Carotid arterial disease   For questions or updates, please contact [facility] Please consult [email or web address] for contact info under        Signed, Wendie Hamburg, MD  11/22/2023, 10:21 AM

## 2023-11-22 NOTE — Progress Notes (Signed)
 PROGRESS NOTE    [name] [name] [name]  VHQ:[id number] DOB: 05/13/1946 DOA: 11/21/2023 PCP: [name], NP   Brief Narrative:  This 78 y.o. male with medical history significant of hypertension, hyperlipidemia, CHF, atrial fibrillation on Xarelto , CVA, diabetes mellitus type 2, and chronic kidney disease stage IV who presents with shortness of breath.  Patient had just underwent cardioversion with Dr. [name] on 5/19. He did not feel well before the procedure, experiencing shortness of breath and exhaustion.  Following the procedure patient reported symptoms did not seem to improve.  Patient was brought in the ED found to have SpO2 of 88% on room air requiring CPAP.  Patient was given sublingual nitro and aspirin  in the ED. Chest x-ray shows cardiac enlargement with small left pleural effusion, mild gestational edema, patchy opacities within the left mid and lower lung concerning for pneumonia versus atypical edema.  Patient was given IV Lasix  and was admitted for further evaluation cardiology is consulted.  Assessment & Plan:   Principal Problem:   Acute on chronic heart failure with mildly reduced ejection fraction (HFmrEF, 41-49%) (HCC) Active Problems:   Acute respiratory failure with hypoxia (HCC)   Elevated troponin   Paroxysmal atrial fibrillation (HCC)   Essential hypertension   Controlled type 2 diabetes mellitus without complication, without long-term current use of insulin  (HCC)   CKD (chronic kidney disease), stage IV (HCC)   Anemia of chronic disease   Obesity, Class I, BMI 30-34.9   Acute on chronic combined systolic and diastolic CHF (congestive heart failure) (HCC)   Persistent atrial fibrillation (HCC)   Acute on chronic HFrEF (heart failure with reduced ejection fraction) (HCC)   Acute hypoxic respiratory failure: Acute on chronic heart failure with reduced EF: Patient presented with shortness of breath, Hypoxia subsequently requiring BiPAP. Chest x-ray showing  concern for pulmonary edema with left-sided opacities that may be pneumonia versus edema.  BNP elevated at 559.2.  Last echocardiogram revealed EF to be 45 to 50% with grade 2 diastolic dysfunction. Continuous pulse oximetry with oxygen maintain O2 saturation greater than 92%  Continue BiPAP,  wean off as tolerated Check I&O's and daily weights ProCalcitonin 0.1 low suspicion for infection. Repeat echo shows LVEF 35 to 25%. Continue Lasix  40 mg IV twice daily Continue ARB, hydralazine  100 mg 3 times daily, labetalol  200 mg 3 times daily Cardiology consulted, continue with above medications.   Elevated troponin: High-sensitivity troponins noted to be 47->52.   Likely demand ischemia in the setting of acute CHF exacerbation  EKG appears similar to priors.  Previous stress test done in 06/2022 was noted to be normal.     Paroxysmal atrial fibrillation on chronic anticoagulation Patient had just underwent electrical cardioversion yesterday with Dr. [name].   Currently appears to be in sinus rhythm with occasional PVCs.Continue amiodarone  and Xarelto  Goal potassium at least 4 and magnesium at least 2.  Replace electrolytes to goal   Essential hypertension: Blood pressures currently elevated 152/97-159/94. Continue amlodipine , hydralazine , labetalol , and valsartan   Diabetes mellitus type 2, with long-term use of insulin : Last hemoglobin A1c noted to be 6.9 when checked 09/23/2023.   Home medication regimen includes Lantus  9 units daily and nateglinide  Continue Hypoglycemic protocols Held nateglinide  Continue pharmacy substitution for Lantus  CBGs before every meal with very sensitive SSI  Adjust insulin  regimen as needed   Chronic kidney disease stage IV: Creatinine noted to be 2.9 which appears around patient's baseline of 3-3.1. Continue to monitor kidney function with diuresis   Normocytic anemia:  Hemoglobin 11.3 which appears around patient's baseline which ranges from 11-12. -  Continue to monitor   Obesity BMI 30.85 kg/m  DVT prophylaxis: Xarelto  Code Status: Full code Family Communication: No family at bedside. Disposition Plan:    Status is: Inpatient Remains inpatient appropriate because: Admitted for CHF exacerbation    Consultants:  Cardiology  Procedures:  Antimicrobials:  Anti-infectives (From admission, onward)    None       Subjective: Patient was seen and examined at bedside.  Overnight events noted. Patient reports feeling better,  Patient had a fever today 100.7.  Objective: Vitals:   11/22/23 0456 11/22/23 0825 11/22/23 0935 11/22/23 1133  BP: (!) 152/82 (!) 123/53 (!) 132/53 124/75  Pulse: 77 79  73  Resp: 20 19  (!) 30  Temp: (!) 100.7 F (38.2 C) 99 F (37.2 C)  99.2 F (37.3 C)  TempSrc: Oral Oral  Oral  SpO2: 100% 97%  97%  Weight: 98.5 kg     Height:        Intake/Output Summary (Last 24 hours) at 11/22/2023 1213 Last data filed at 11/22/2023 0455 Gross per 24 hour  Intake 600 ml  Output 1225 ml  Net -625 ml   Filed Weights   11/21/23 1500 11/22/23 0456  Weight: 101.8 kg 98.5 kg    Examination:  General exam: Appears calm and comfortable, not in any acute distress . Respiratory system: CTA Bilaterally.  Respiratory effort normal. RR 15 Cardiovascular system: S1 & S2 heard, Irregular rhythm. No JVD, murmurs, rubs, gallops or clicks. Gastrointestinal system: Abdomen is non distended, soft and non tender. No organomegaly or masses felt. Normal bowel sounds heard. Central nervous system: Alert and oriented x 3. No focal neurological deficits. Extremities: Edema+, No cyanosis, no clubbing Skin: No rashes, lesions or ulcers Psychiatry: Judgement and insight appear normal. Mood & affect appropriate.     Data Reviewed: I have personally reviewed following labs and imaging studies  CBC: Recent Labs  Lab 11/21/23 0510  WBC 9.6  NEUTROABS 7.1  HGB 11.3*  HCT 33.7*  MCV 79.3*  PLT 296   Basic Metabolic  Panel: Recent Labs  Lab 11/21/23 0510 11/22/23 0222  NA 138 141  K 3.9 3.5  CL 108 106  CO2 19* 22  GLUCOSE 199* 176*  BUN 34* 35*  CREATININE 2.90* 2.96*  CALCIUM 9.2 9.2  MG  --  1.6*   GFR: Estimated Creatinine Clearance: 25.4 mL/min (A) (by C-G formula based on SCr of 2.96 mg/dL (H)). Liver Function Tests: Recent Labs  Lab 11/21/23 0510  AST 14*  ALT 10  ALKPHOS 58  BILITOT 1.2  PROT 7.2  ALBUMIN 3.8   Recent Labs  Lab 11/21/23 0510  LIPASE 34   No results for input(s): "AMMONIA" in the last 168 hours. Coagulation Profile: No results for input(s): "INR", "PROTIME" in the last 168 hours. Cardiac Enzymes: No results for input(s): "CKTOTAL", "CKMB", "CKMBINDEX", "TROPONINI" in the last 168 hours. BNP (last 3 results) No results for input(s): "PROBNP" in the last 8760 hours. HbA1C: No results for input(s): "HGBA1C" in the last 72 hours. CBG: Recent Labs  Lab 11/21/23 1145 11/21/23 1603 11/21/23 2150 11/22/23 0629 11/22/23 1135  GLUCAP 174* 156* 192* 172* 207*   Lipid Profile: No results for input(s): "CHOL", "HDL", "LDLCALC", "TRIG", "CHOLHDL", "LDLDIRECT" in the last 72 hours. Thyroid  Function Tests: No results for input(s): "TSH", "T4TOTAL", "FREET4", "T3FREE", "THYROIDAB" in the last 72 hours. Anemia Panel: No results for input(s): "VITAMINB12", "  FOLATE", "FERRITIN", "TIBC", "IRON", "RETICCTPCT" in the last 72 hours. Sepsis Labs: Recent Labs  Lab 11/21/23 0722  PROCALCITON <0.10    No results found for this or any previous visit (from the past 240 hours).   Radiology Studies: ECHOCARDIOGRAM COMPLETE Result Date: 11/21/2023    ECHOCARDIOGRAM REPORT   Patient Name:   BRIAN ZEITLIN Date of Exam: 11/21/2023 Medical Rec #:  [id number]        Height:       72.0 in Accession #:    [id number]       Weight:       227.5 lb Date of Birth:  1945-08-24        BSA:          2.250 m Patient Age:    77 years         BP:           145/95 mmHg Patient Gender: M                 HR:           71 bpm. Exam Location:  Inpatient Procedure: 2D Echo, Color Doppler and Cardiac Doppler (Both Spectral and Color            Flow Doppler were utilized during procedure). Indications:    CHF-Acute Diastolic I50.31  History:        Patient has no prior history of Echocardiogram examinations,                 most recent 07/05/2022.  Sonographer:    Hersey Lorenzo RDCS Referring Phys: [phone] RONDELL A SMITH IMPRESSIONS  1. Left ventricular ejection fraction, by estimation, is 30 to 35%. The left ventricle has moderately decreased function. The left ventricle demonstrates global hypokinesis. The left ventricular internal cavity size was mildly dilated. There is mild concentric left ventricular hypertrophy. Left ventricular diastolic parameters are consistent with Grade III diastolic dysfunction (restrictive). Elevated left atrial pressure.  2. Right ventricular systolic function is normal. The right ventricular size is mildly enlarged. There is mildly elevated pulmonary artery systolic pressure. The estimated right ventricular systolic pressure is 42.0 mmHg.  3. Left atrial size was severely dilated.  4. Right atrial size was severely dilated.  5. A small pericardial effusion is present. The pericardial effusion is circumferential. There is no evidence of cardiac tamponade.  6. The mitral valve is normal in structure. No evidence of mitral valve regurgitation. No evidence of mitral stenosis.  7. The aortic valve is tricuspid. There is mild calcification of the aortic valve. There is mild thickening of the aortic valve. Aortic valve regurgitation is not visualized. Aortic valve sclerosis/calcification is present, without any evidence of aortic stenosis.  8. The inferior vena cava is dilated in size with <50% respiratory variability, suggesting right atrial pressure of 15 mmHg. Comparison(s): Note low QRS complex voltage on ECG. Consider restrictive/infiltrative cardiomyopathy such as cardiac  amyloidosis, probably with superimposed tachycardia cardiomyopathy, as well as possible mechanical atrial stunning shortly after cardioversion. FINDINGS  Left Ventricle: Left ventricular ejection fraction, by estimation, is 30 to 35%. The left ventricle has moderately decreased function. The left ventricle demonstrates global hypokinesis. The left ventricular internal cavity size was mildly dilated. There is mild concentric left ventricular hypertrophy. Left ventricular diastolic parameters are consistent with Grade III diastolic dysfunction (restrictive). Elevated left atrial pressure. Right Ventricle: The right ventricular size is mildly enlarged. No increase in right ventricular wall thickness. Right ventricular systolic function is normal.  There is mildly elevated pulmonary artery systolic pressure. The tricuspid regurgitant velocity is 2.60 m/s, and with an assumed right atrial pressure of 15 mmHg, the estimated right ventricular systolic pressure is 42.0 mmHg. Left Atrium: Left atrial size was severely dilated. Right Atrium: Right atrial size was severely dilated. Pericardium: A small pericardial effusion is present. The pericardial effusion is circumferential. There is no evidence of cardiac tamponade. Mitral Valve: The mitral valve is normal in structure. No evidence of mitral valve regurgitation. No evidence of mitral valve stenosis. Tricuspid Valve: The tricuspid valve is normal in structure. Tricuspid valve regurgitation is mild. Aortic Valve: The aortic valve is tricuspid. There is mild calcification of the aortic valve. There is mild thickening of the aortic valve. Aortic valve regurgitation is not visualized. Aortic valve sclerosis/calcification is present, without any evidence of aortic stenosis. Pulmonic Valve: The pulmonic valve was grossly normal. Pulmonic valve regurgitation is not visualized. No evidence of pulmonic stenosis. Aorta: The aortic root and ascending aorta are structurally normal,  with no evidence of dilitation. Venous: The inferior vena cava is dilated in size with less than 50% respiratory variability, suggesting right atrial pressure of 15 mmHg. IAS/Shunts: No atrial level shunt detected by color flow Doppler.  LEFT VENTRICLE PLAX 2D LVIDd:         5.80 cm   Diastology LVIDs:         4.90 cm   LV e' medial:    4.24 cm/s LV PW:         1.30 cm   LV E/e' medial:  23.1 LV IVS:        1.30 cm   LV e' lateral:   4.57 cm/s LVOT diam:     2.30 cm   LV E/e' lateral: 21.4 LV SV:         85 LV SV Index:   38 LVOT Area:     4.15 cm  RIGHT VENTRICLE             IVC RV S prime:     15.60 cm/s  IVC diam: 2.90 cm TAPSE (M-mode): 2.4 cm LEFT ATRIUM              Index        RIGHT ATRIUM           Index LA diam:        5.30 cm  2.36 cm/m   RA Area:     24.20 cm LA Vol (A2C):   163.0 ml 72.43 ml/m  RA Volume:   77.10 ml  34.26 ml/m LA Vol (A4C):   160.0 ml 71.10 ml/m LA Biplane Vol: 165.0 ml 73.32 ml/m  AORTIC VALVE LVOT Vmax:   97.80 cm/s LVOT Vmean:  70.300 cm/s LVOT VTI:    0.205 m  AORTA Ao Root diam: 3.10 cm Ao Asc diam:  3.40 cm MITRAL VALVE               TRICUSPID VALVE MV Area (PHT): 5.58 cm    TR Peak grad:   27.0 mmHg MV Decel Time: 136 msec    TR Vmax:        260.00 cm/s MV E velocity: 97.90 cm/s MV A velocity: 39.90 cm/s  SHUNTS MV E/A ratio:  2.45        Systemic VTI:  0.20 m  Systemic Diam: 2.30 cm [name] MD Electronically signed by [name] MD Signature Date/Time: 11/21/2023/3:13:04 PM    Final    DG Chest Port 1 View Result Date: 11/21/2023 CLINICAL DATA:  Shortness of breath. EXAM: PORTABLE CHEST 1 VIEW COMPARISON:  08/09/2023 FINDINGS: Cardiac enlargement. Blunting of the left costophrenic angle. Mild increase interstitial markings. Patchy opacities identified within the left mid and left lower lung. The visualized osseous structures are unremarkable. IMPRESSION: 1. Cardiac enlargement with small left pleural effusion and mild interstitial  edema. 2. Patchy opacities within the left mid and left lower lung compatible with pneumonia versus asymmetric edema. Electronically Signed   By: Kimberley Penman M.D.   On: 11/21/2023 05:34   Scheduled Meds:  amiodarone   200 mg Oral Daily   amLODipine   5 mg Oral Daily   calcitRIOL   0.25 mcg Oral QODAY   ezetimibe   10 mg Oral Daily   feeding supplement  237 mL Oral BID BM   furosemide   40 mg Intravenous BID   hydrALAZINE   100 mg Oral Q8H   insulin  aspart  0-6 Units Subcutaneous TID WC   insulin  glargine-yfgn  9 Units Subcutaneous Daily   irbesartan  150 mg Oral Daily   labetalol   200 mg Oral TID   latanoprost  1 drop Both Eyes QHS   Rivaroxaban   15 mg Oral Q supper   sodium chloride  flush  3 mL Intravenous Q12H   timolol  1 drop Both Eyes BID   Continuous Infusions:  magnesium sulfate bolus IVPB       LOS: 0 days    Time spent: 50 mins    Magdalene School, MD [facility]   If 7PM-7AM, please contact night-coverage

## 2023-11-23 ENCOUNTER — Ambulatory Visit: Payer: Self-pay

## 2023-11-23 DIAGNOSIS — N179 Acute kidney failure, unspecified: Secondary | ICD-10-CM

## 2023-11-23 DIAGNOSIS — I5023 Acute on chronic systolic (congestive) heart failure: Secondary | ICD-10-CM | POA: Diagnosis not present

## 2023-11-23 LAB — BASIC METABOLIC PANEL WITH GFR
Anion gap: 11 (ref 5–15)
BUN: 36 mg/dL — ABNORMAL HIGH (ref 8–23)
CO2: 23 mmol/L (ref 22–32)
Calcium: 8.9 mg/dL (ref 8.9–10.3)
Chloride: 106 mmol/L (ref 98–111)
Creatinine, Ser: 3.26 mg/dL — ABNORMAL HIGH (ref 0.61–1.24)
GFR, Estimated: 19 mL/min — ABNORMAL LOW (ref >60)
Glucose, Bld: 176 mg/dL — ABNORMAL HIGH (ref 70–99)
Potassium: 3.4 mmol/L — ABNORMAL LOW (ref 3.5–5.1)
Sodium: 140 mmol/L (ref 135–145)

## 2023-11-23 LAB — CBC
HCT: 34.4 % — ABNORMAL LOW (ref 39.0–52.0)
Hemoglobin: 11.9 g/dL — ABNORMAL LOW (ref 13.0–17.0)
MCH: 26.8 pg (ref 26.0–34.0)
MCHC: 34.6 g/dL (ref 30.0–36.0)
MCV: 77.5 fL — ABNORMAL LOW (ref 80.0–100.0)
Platelets: 318 10*3/uL (ref 150–400)
RBC: 4.44 MIL/uL (ref 4.22–5.81)
RDW: 14.6 % (ref 11.5–15.5)
WBC: 9 10*3/uL (ref 4.0–10.5)
nRBC: 0 % (ref 0.0–0.2)

## 2023-11-23 LAB — GLUCOSE, CAPILLARY
Glucose-Capillary: 175 mg/dL — ABNORMAL HIGH (ref 70–99)
Glucose-Capillary: 156 mg/dL — ABNORMAL HIGH (ref 70–99)
Glucose-Capillary: 289 mg/dL — ABNORMAL HIGH (ref 70–99)
Glucose-Capillary: 229 mg/dL — ABNORMAL HIGH (ref 70–99)
Glucose-Capillary: 186 mg/dL — ABNORMAL HIGH (ref 70–99)

## 2023-11-23 LAB — MAGNESIUM: Magnesium: 1.9 mg/dL (ref 1.7–2.4)

## 2023-11-23 LAB — PHOSPHORUS: Phosphorus: 3.7 mg/dL (ref 2.5–4.6)

## 2023-11-23 MED ORDER — AMIODARONE HCL 200 MG PO TABS
200.0000 mg | ORAL_TABLET | Freq: Two times a day (BID) | ORAL | Status: DC
  Administered 2023-11-23 – 2023-11-24 (×3): 200 mg via ORAL
  Filled 2023-11-23 (×3): qty 1

## 2023-11-23 MED ORDER — TORSEMIDE 20 MG PO TABS
40.0000 mg | ORAL_TABLET | Freq: Every day | ORAL | Status: DC
  Administered 2023-11-23 – 2023-11-24 (×2): 40 mg via ORAL
  Filled 2023-11-23 (×2): qty 2

## 2023-11-23 MED ORDER — AMIODARONE HCL 200 MG PO TABS: 200.0000 mg | ORAL_TABLET | Freq: Every day | ORAL | Status: DC

## 2023-11-23 MED ORDER — POTASSIUM CHLORIDE 20 MEQ PO PACK
40.0000 meq | PACK | Freq: Once | ORAL | Status: AC
  Administered 2023-11-23: 40 meq via ORAL
  Filled 2023-11-23: qty 2

## 2023-11-23 NOTE — Telephone Encounter (Signed)
 Copied from CRM [id number]. Topic: Clinical - Red Word Triage >> Nov 23, 2023 10:57 AM [name] [name] wrote: Red Word that prompted transfer to Nurse Triage: Pt is currently at [facility] and wife [name] [name]) is on the line and is very concerned because she feels like the clinical staff is not listening to her about his blood sugar.  He is on a set dose for his blood sugar and they are not giving it to him as they should. States they are giving small doses rather than his needed doses.  States that his blood sugar has been in the 200's and was reading over 250 about 10-minutes ago.  Blood pressure was reading high as well (not Red word high though). Pulse rate was 68 but has been in the 110's   Chief Complaint: Wife reports pt. Currently in the hospital and she does not agree with the"way they are doing his insulin . They give him short acting doses instead of 1 dose daily."  Symptoms: n/a Frequency: This week Pertinent Negatives: Patient denies  Disposition: [] ED /[] Urgent Care (no appt availability in office) / [] Appointment(In office/virtual)/ []  [place] [facility]/ [] Home Care/ [] Refused Recommended Disposition /[] [facility]/ [x]  Follow-up with PCP Additional Notes: Will follow up after discharge.  Reason for Disposition  [1] Caller has URGENT medication or insulin  pump question AND [2] triager unable to answer question  Answer Assessment - Initial Assessment Questions 1. BLOOD GLUCOSE: "What is your blood glucose level?"      200-250 2. ONSET: "When did you check the blood glucose?"     Pt. Is admitted to hospital 3. USUAL RANGE: "What is your glucose level usually?" (e.g., usual fasting morning value, usual evening value)     200 4. KETONES: "Do you check for ketones (urine or blood test strips)?" If Yes, ask: "What does the test show now?"      no 5. TYPE 1 or 2:  "Do you know what type of diabetes you have?"  (e.g., Type 1, Type 2, Gestational; doesn't know)      Type 1   6. INSULIN : "Do you take insulin ?" "What type of insulin (s) do you use? What is the mode of delivery? (syringe, pen; injection or pump)?"      yes 7. DIABETES PILLS: "Do you take any pills for your diabetes?" If Yes, ask: "Have you missed taking any pills recently?"     N/a 8. OTHER SYMPTOMS: "Do you have any symptoms?" (e.g., fever, frequent urination, difficulty breathing, dizziness, weakness, vomiting)     In hospital 9. PREGNANCY: "Is there any chance you are pregnant?" "When was your last menstrual period?"     N/a  Protocols used: Diabetes - High Blood Sugar-A-AH

## 2023-11-23 NOTE — Plan of Care (Signed)
  Problem: Education: Goal: Ability to describe self-care measures that may prevent or decrease complications (Diabetes Survival Skills Education) will improve Outcome: Progressing   Problem: Coping: Goal: Ability to adjust to condition or change in health will improve Outcome: Progressing   Problem: Fluid Volume: Goal: Ability to maintain a balanced intake and output will improve Outcome: Progressing   Problem: Health Behavior/Discharge Planning: Goal: Ability to identify and utilize available resources and services will improve Outcome: Progressing   Problem: Health Behavior/Discharge Planning: Goal: Ability to manage health-related needs will improve Outcome: Progressing   Problem: Metabolic: Goal: Ability to maintain appropriate glucose levels will improve Outcome: Progressing

## 2023-11-23 NOTE — Progress Notes (Signed)
 Rounding Note    Patient Name: [name] [name] Date of Encounter: 11/23/2023  [facility] Cardiologist: [name], MD   Subjective   Shelah Derry into A-fib yesterday afternoon, started on amiodarone  drip and overnight converted back to sinus rhythm.  Creatinine rising (2.96 > 3.26).  BP 128/73 this morning.  He reports dyspnea improved  Inpatient Medications    Scheduled Meds:  amLODipine   5 mg Oral Daily   calcitRIOL   0.25 mcg Oral QODAY   ezetimibe   10 mg Oral Daily   feeding supplement  237 mL Oral BID BM   hydrALAZINE   100 mg Oral Q8H   insulin  aspart  0-6 Units Subcutaneous TID PC,HS,0200   insulin  glargine-yfgn  9 Units Subcutaneous Daily   labetalol   200 mg Oral TID   latanoprost  1 drop Both Eyes QHS   Rivaroxaban   15 mg Oral Q supper   sodium chloride  flush  3 mL Intravenous Q12H   timolol  1 drop Both Eyes BID   Continuous Infusions:  amiodarone  30 mg/hr (11/23/23 0728)   PRN Meds: acetaminophen , metoprolol  tartrate, ondansetron (ZOFRAN) IV, sodium chloride  flush   Vital Signs    Vitals:   11/23/23 0017 11/23/23 0611 11/23/23 0732 11/23/23 1043  BP: 123/74 (!) 148/78 (!) 143/72 128/73  Pulse: 67 69 64 64  Resp: 18 20 (!) 31 20  Temp: 98.9 F (37.2 C) 99.2 F (37.3 C) 99 F (37.2 C) 98.8 F (37.1 C)  TempSrc: Oral Oral Oral Oral  SpO2: 98% 98% 98% 98%  Weight:  97 kg    Height:        Intake/Output Summary (Last 24 hours) at 11/23/2023 1134 Last data filed at 11/23/2023 0858 Gross per 24 hour  Intake 1640 ml  Output 1875 ml  Net -235 ml      11/23/2023    6:11 AM 11/22/2023    4:56 AM 11/21/2023    3:00 PM  Last 3 Weights  Weight (lbs) 213 lb 14.4 oz 217 lb 3.2 oz 224 lb 6.9 oz  Weight (kg) 97.024 kg 98.521 kg 101.8 kg      Telemetry    Normal sinus rhythm, PVCs- Personally Reviewed  ECG    No new ECG- Personally Reviewed  Physical Exam   GEN: No acute distress.   Neck: + JVD Cardiac: RRR, no murmurs, rubs, or gallops.   Respiratory: Diminished breath sounds GI: Soft, nontender, non-distended  MS: Trace edema; No deformity. Neuro:  Nonfocal  Psych: Normal affect   Labs    High Sensitivity Troponin:   Recent Labs  Lab 11/21/23 0510 11/21/23 0722  TROPONINIHS 47* 52*     Chemistry Recent Labs  Lab 11/21/23 0510 11/22/23 0222 11/23/23 0248  NA 138 141 140  K 3.9 3.5 3.4*  CL 108 106 106  CO2 19* 22 23  GLUCOSE 199* 176* 176*  BUN 34* 35* 36*  CREATININE 2.90* 2.96* 3.26*  CALCIUM 9.2 9.2 8.9  MG  --  1.6* 1.9  PROT 7.2  --   --   ALBUMIN 3.8  --   --   AST 14*  --   --   ALT 10  --   --   ALKPHOS 58  --   --   BILITOT 1.2  --   --   GFRNONAA 22* 21* 19*  ANIONGAP 11 13 11     Lipids No results for input(s): "CHOL", "TRIG", "HDL", "LABVLDL", "LDLCALC", "CHOLHDL" in the last 168 hours.  Hematology Recent Labs  Lab 11/21/23 0510 11/23/23 0248  WBC 9.6 9.0  RBC 4.25 4.44  HGB 11.3* 11.9*  HCT 33.7* 34.4*  MCV 79.3* 77.5*  MCH 26.6 26.8  MCHC 33.5 34.6  RDW 14.9 14.6  PLT 296 318   Thyroid  No results for input(s): "TSH", "FREET4" in the last 168 hours.  BNP Recent Labs  Lab 11/21/23 0510  BNP 595.2*    DDimer No results for input(s): "DDIMER" in the last 168 hours.   Radiology    US  EKG SITE RITE Result Date: 11/22/2023 If Site Rite image not attached, placement could not be confirmed due to current cardiac rhythm.   Cardiac Studies     Patient Profile     78 y.o. male with a hx of HTN, HLD, T2DM, stroke (2019), CKD stage 4, chronic combined CHF, carotid artery disease, gout, chronic prostatitis, chronic anemia who is being seen 11/21/2023 for the evaluation of acute on chronic CHF   Assessment & Plan    Acute on chronic combined CHF  Chronic dyspnea on exertion Hypertension  Echo from 07/2022 showed: EF 45-50%.  Echo this admission shows EF 30 to 35% BNP elevated at 595 CXR showed cardiomegaly, pulmonary edema Troponin level 47 > 52, likely demand in  setting of acute CHF exacerbation  Suspect worsening systolic function is tachycardia induced.  He underwent successful cardioversion on 5/19.  Not currently candidate for invasive evaluation given recent cardioversion and his CKD stage IV Currently on IV Lasix  40 mg BID, hydralazine  100 mg TID, irbesartan 150 mg daily, labetalol  200 mg TID, amlodipine  5 mg daily  Stop IV Lasix  given bump in creatinine, will transition to p.o. torsemide Hold irbesartan given worsening renal function Continue to monitor strict I&O's, daily BMPs, and daily weights  Will keep a close eye on renal function Replete K greater than 4, mag greater than 2   Persistent atrial fibrillation s/p DCCV  Recent successful cardioversion 5/19  Awaiting PVI + posterior wall + CTI ablation in future Went into A-fib yesterday afternoon, started on amiodarone  drip and overnight converted back to sinus rhythm.   Currently on amiodarone  gtt, Xarelto  15 mg.  Has converted back to sinus rhythm, will transition from amiodarone  drip to p.o. amiodarone     Hyperlipidemia  03/21/2023: HDL 39.90; LDL Cholesterol 98 11/21/2023: ALT 10  Currently on Zetia  10 mg daily Reports intolerant to statins    AKI on CKD stage 4  Closely monitor renal function with diuresis.  Creatinine up to 3.36 today, will discontinue IV Lasix  and transition to p.o. torsemide as above  Per primary team Diabetes  History of CVA  Anemia Carotid arterial disease   For questions or updates, please contact  [facility] Please consult [email or web address] for contact info under        Signed, Wendie Hamburg, MD  11/23/2023, 11:34 AM

## 2023-11-23 NOTE — Telephone Encounter (Signed)
Patient spouse notified of update  and verbalized understanding. 

## 2023-11-23 NOTE — Progress Notes (Signed)
 0004: CCMD called to inform this RN that patient rhythm is now NSR.  0010: EKG is done and it shows NSR with PAC.  0016: Dr. Annitta Kindler is notified.

## 2023-11-23 NOTE — Telephone Encounter (Signed)
**Note De-identified  [place] Obfuscation** Please advise 

## 2023-11-23 NOTE — Progress Notes (Signed)
 PROGRESS NOTE    [name] [name]  WUJ:[id number] DOB: [birth year] DOA: 11/21/2023 PCP: [name], NP   Brief Narrative:  This 78 y.o. male with medical history significant of hypertension, hyperlipidemia, CHF, atrial fibrillation on Xarelto , CVA, diabetes mellitus type 2, and chronic kidney disease stage IV who presents with shortness of breath.  Patient had just underwent cardioversion with Dr. [name] on 5/19. He did not feel well before the procedure, experiencing shortness of breath and exhaustion.  Following the procedure patient reported symptoms did not seem to improve.  Patient was brought in the ED found to have SpO2 of 88% on room air requiring CPAP.  Patient was given sublingual nitro and aspirin  in the ED. Chest x-ray shows cardiac enlargement with small left pleural effusion, mild gestational edema, patchy opacities within the left mid and lower lung concerning for pneumonia versus atypical edema.  Patient was given IV Lasix  and was admitted for further evaluation cardiology is consulted.  Assessment & Plan:   Principal Problem:   Acute on chronic heart failure with mildly reduced ejection fraction (HFmrEF, 41-49%) (HCC) Active Problems:   Acute respiratory failure with hypoxia (HCC)   Elevated troponin   Paroxysmal atrial fibrillation (HCC)   Essential hypertension   Controlled type 2 diabetes mellitus without complication, without long-term current use of insulin  (HCC)   CKD (chronic kidney disease), stage IV (HCC)   Anemia of chronic disease   Obesity, Class I, BMI 30-34.9   Acute on chronic combined systolic and diastolic CHF (congestive heart failure) (HCC)   Persistent atrial fibrillation (HCC)   Acute on chronic HFrEF (heart failure with reduced ejection fraction) (HCC)   AKI (acute kidney injury) (HCC)   Acute hypoxic respiratory failure: Acute on chronic heart failure with reduced EF: Patient presented with shortness of breath, Hypoxia subsequently  requiring BiPAP. Chest x-ray showing concern for pulmonary edema with left-sided opacities that may be pneumonia versus edema.   BNP elevated at 559.2.  Last echocardiogram revealed EF to be 45 to 50% with grade 2 diastolic dysfunction. Continuous pulse oximetry with oxygen maintain O2 saturation greater than 92%  Continue BiPAP,  wean off as tolerated Check I&O's and daily weights ProCalcitonin 0.1 low suspicion for infection. Repeat echo shows LVEF 35 to 25%. Tachycardia induced IV Lasix  changed with torsemide given increase in Serum Creatinine. Hold ARB, Continue hydralazine  100 mg 3 times daily, labetalol  200 mg 3 times daily Cardiology consulted, continue with above medications.   Elevated troponin: High-sensitivity troponins noted to be 47->52.   Likely demand ischemia in the setting of acute CHF exacerbation  EKG appears similar to priors.  Previous stress test done in 06/2022 was noted to be normal.     Paroxysmal atrial fibrillation on chronic anticoagulation Patient had just underwent electrical cardioversion 5/20 with Dr. [name].   Currently appears to be in sinus rhythm with occasional PVCs. Continue amiodarone  and Xarelto  Patient went into A-fib with RVR again and started on amiodarone  infusion.   Overnight converted to normal sinus rhythm. Medication changed to amiodarone  p.o. Cardiology following.  Essential hypertension: Blood pressures currently elevated 152/97-159/94. Continue amlodipine , hydralazine , labetalol , and hold valsartan   Diabetes mellitus type 2, with long-term use of insulin : Last hemoglobin A1c noted to be 6.9 when checked 09/23/2023.   Home medication regimen includes Lantus  9 units daily and nateglinide  Continue Hypoglycemic protocols Held nateglinide  Continue pharmacy substitution for Lantus  CBGs before every meal with very sensitive SSI  Adjust insulin  regimen as needed   Chronic  kidney disease stage IV: Creatinine noted to be 2.9 which  appears around patient's baseline of 3-3.1. Continue to monitor kidney function with diuresis   Normocytic anemia: Hemoglobin 11.3 which appears around patient's baseline which ranges from 11-12. - Continue to monitor   Obesity BMI 30.85 kg/m  DVT prophylaxis: Xarelto  Code Status: Full code Family Communication: No family at bedside. Disposition Plan:    Status is: Inpatient Remains inpatient appropriate because: Admitted for CHF exacerbation    Consultants:  Cardiology  Procedures:  Antimicrobials:  Anti-infectives (From admission, onward)    None       Subjective: Patient was seen and examined at bedside.  Overnight events noted. Patient reports feeling better, He remains afebrile for 36 hrs, He denies any palpitations.  Objective: Vitals:   11/23/23 0017 11/23/23 0611 11/23/23 0732 11/23/23 1043  BP: 123/74 (!) 148/78 (!) 143/72 128/73  Pulse: 67 69 64 64  Resp: 18 20 (!) 31 20  Temp: 98.9 F (37.2 C) 99.2 F (37.3 C) 99 F (37.2 C) 98.8 F (37.1 C)  TempSrc: Oral Oral Oral Oral  SpO2: 98% 98% 98% 98%  Weight:  97 kg    Height:        Intake/Output Summary (Last 24 hours) at 11/23/2023 1206 Last data filed at 11/23/2023 0858 Gross per 24 hour  Intake 1440 ml  Output 1875 ml  Net -435 ml   Filed Weights   11/21/23 1500 11/22/23 0456 11/23/23 0611  Weight: 101.8 kg 98.5 kg 97 kg    Examination:  General exam: Appears calm and comfortable, not in any acute distress . Respiratory system: CTA Bilaterally.  Respiratory effort normal. RR 14 Cardiovascular system: S1 & S2 heard, Irregular rhythm. No JVD, murmurs, rubs, gallops or clicks.  Gastrointestinal system: Abdomen is non distended, soft and non tender.  Normal bowel sounds heard. Central nervous system: Alert and oriented x 3. No focal neurological deficits. Extremities: Edema+, No cyanosis, no clubbing Skin: No rashes, lesions or ulcers Psychiatry: Judgement and insight appear normal. Mood &  affect appropriate.     Data Reviewed: I have personally reviewed following labs and imaging studies  CBC: Recent Labs  Lab 11/21/23 0510 11/23/23 0248  WBC 9.6 9.0  NEUTROABS 7.1  --   HGB 11.3* 11.9*  HCT 33.7* 34.4*  MCV 79.3* 77.5*  PLT 296 318   Basic Metabolic Panel: Recent Labs  Lab 11/21/23 0510 11/22/23 0222 11/23/23 0248  NA 138 141 140  K 3.9 3.5 3.4*  CL 108 106 106  CO2 19* 22 23  GLUCOSE 199* 176* 176*  BUN 34* 35* 36*  CREATININE 2.90* 2.96* 3.26*  CALCIUM 9.2 9.2 8.9  MG  --  1.6* 1.9  PHOS  --   --  3.7   GFR: Estimated Creatinine Clearance: 23 mL/min (A) (by C-G formula based on SCr of 3.26 mg/dL (H)). Liver Function Tests: Recent Labs  Lab 11/21/23 0510  AST 14*  ALT 10  ALKPHOS 58  BILITOT 1.2  PROT 7.2  ALBUMIN 3.8   Recent Labs  Lab 11/21/23 0510  LIPASE 34   No results for input(s): "AMMONIA" in the last 168 hours. Coagulation Profile: No results for input(s): "INR", "PROTIME" in the last 168 hours. Cardiac Enzymes: No results for input(s): "CKTOTAL", "CKMB", "CKMBINDEX", "TROPONINI" in the last 168 hours. BNP (last 3 results) No results for input(s): "PROBNP" in the last 8760 hours. HbA1C: No results for input(s): "HGBA1C" in the last 72 hours. CBG: Recent Labs  Lab 11/22/23 1603 11/22/23 2109 11/23/23 0211 11/23/23 0610 11/23/23 1044  GLUCAP 197* 269* 175* 156* 289*   Lipid Profile: No results for input(s): "CHOL", "HDL", "LDLCALC", "TRIG", "CHOLHDL", "LDLDIRECT" in the last 72 hours. Thyroid  Function Tests: No results for input(s): "TSH", "T4TOTAL", "FREET4", "T3FREE", "THYROIDAB" in the last 72 hours. Anemia Panel: No results for input(s): "VITAMINB12", "FOLATE", "FERRITIN", "TIBC", "IRON", "RETICCTPCT" in the last 72 hours. Sepsis Labs: Recent Labs  Lab 11/21/23 0722  PROCALCITON <0.10    No results found for this or any previous visit (from the past 240 hours).   Radiology Studies: US  EKG SITE  RITE Result Date: 11/22/2023 If Site Rite image not attached, placement could not be confirmed due to current cardiac rhythm.  Scheduled Meds:  amiodarone   200 mg Oral BID   Followed by   Cecily Cohen ON 11/30/2023] amiodarone   200 mg Oral Daily   amLODipine   5 mg Oral Daily   calcitRIOL   0.25 mcg Oral QODAY   ezetimibe   10 mg Oral Daily   feeding supplement  237 mL Oral BID BM   hydrALAZINE   100 mg Oral Q8H   insulin  aspart  0-6 Units Subcutaneous TID PC,HS,0200   insulin  glargine-yfgn  9 Units Subcutaneous Daily   labetalol   200 mg Oral TID   latanoprost  1 drop Both Eyes QHS   Rivaroxaban   15 mg Oral Q supper   sodium chloride  flush  3 mL Intravenous Q12H   timolol  1 drop Both Eyes BID   torsemide  40 mg Oral Daily   Continuous Infusions:     LOS: 1 day    Time spent: 35 mins    Magdalene School, MD [facility]   If 7PM-7AM, please contact night-coverage

## 2023-11-24 ENCOUNTER — Other Ambulatory Visit: Payer: Self-pay | Admitting: Physician Assistant

## 2023-11-24 DIAGNOSIS — I5023 Acute on chronic systolic (congestive) heart failure: Secondary | ICD-10-CM | POA: Diagnosis not present

## 2023-11-24 LAB — CBC
HCT: 34.7 % — ABNORMAL LOW (ref 39.0–52.0)
Hemoglobin: 11.9 g/dL — ABNORMAL LOW (ref 13.0–17.0)
MCH: 26.3 pg (ref 26.0–34.0)
MCHC: 34.3 g/dL (ref 30.0–36.0)
MCV: 76.6 fL — ABNORMAL LOW (ref 80.0–100.0)
Platelets: 304 10*3/uL (ref 150–400)
RBC: 4.53 MIL/uL (ref 4.22–5.81)
RDW: 14.6 % (ref 11.5–15.5)
WBC: 8.9 10*3/uL (ref 4.0–10.5)
nRBC: 0 % (ref 0.0–0.2)

## 2023-11-24 LAB — BASIC METABOLIC PANEL WITH GFR
Anion gap: 9 (ref 5–15)
Anion gap: 10 (ref 5–15)
BUN: 36 mg/dL — ABNORMAL HIGH (ref 8–23)
BUN: 38 mg/dL — ABNORMAL HIGH (ref 8–23)
CO2: 23 mmol/L (ref 22–32)
CO2: 23 mmol/L (ref 22–32)
Calcium: 8.9 mg/dL (ref 8.9–10.3)
Calcium: 8.8 mg/dL — ABNORMAL LOW (ref 8.9–10.3)
Chloride: 108 mmol/L (ref 98–111)
Chloride: 106 mmol/L (ref 98–111)
Creatinine, Ser: 3.25 mg/dL — ABNORMAL HIGH (ref 0.61–1.24)
Creatinine, Ser: 3.46 mg/dL — ABNORMAL HIGH (ref 0.61–1.24)
GFR, Estimated: 19 mL/min — ABNORMAL LOW (ref >60)
GFR, Estimated: 18 mL/min — ABNORMAL LOW (ref >60)
Glucose, Bld: 169 mg/dL — ABNORMAL HIGH (ref 70–99)
Glucose, Bld: 283 mg/dL — ABNORMAL HIGH (ref 70–99)
Potassium: 2.9 mmol/L — ABNORMAL LOW (ref 3.5–5.1)
Potassium: 3.6 mmol/L (ref 3.5–5.1)
Sodium: 140 mmol/L (ref 135–145)
Sodium: 139 mmol/L (ref 135–145)

## 2023-11-24 LAB — GLUCOSE, CAPILLARY
Glucose-Capillary: 61 mg/dL — ABNORMAL LOW (ref 70–99)
Glucose-Capillary: 156 mg/dL — ABNORMAL HIGH (ref 70–99)
Glucose-Capillary: 169 mg/dL — ABNORMAL HIGH (ref 70–99)
Glucose-Capillary: 267 mg/dL — ABNORMAL HIGH (ref 70–99)

## 2023-11-24 LAB — MAGNESIUM: Magnesium: 1.8 mg/dL (ref 1.7–2.4)

## 2023-11-24 MED ORDER — POTASSIUM CHLORIDE CRYS ER 20 MEQ PO TBCR
40.0000 meq | EXTENDED_RELEASE_TABLET | ORAL | Status: AC
  Administered 2023-11-24: 40 meq via ORAL
  Filled 2023-11-24: qty 2

## 2023-11-24 MED ORDER — POTASSIUM CHLORIDE 20 MEQ PO PACK: 40.0000 meq | PACK | Freq: Once | ORAL | Status: DC

## 2023-11-24 MED ORDER — AMLODIPINE BESYLATE 5 MG PO TABS: 5.0000 mg | ORAL_TABLET | Freq: Every day | ORAL | 0 refills | Status: DC

## 2023-11-24 MED ORDER — TORSEMIDE 20 MG PO TABS: 40.0000 mg | ORAL_TABLET | Freq: Every day | ORAL | 0 refills | Status: DC

## 2023-11-24 MED ORDER — AMIODARONE HCL 200 MG PO TABS: ORAL_TABLET | ORAL | 0 refills | Status: DC

## 2023-11-24 MED ORDER — GLUCOSE 40 % PO GEL
1.0000 | ORAL | Status: AC
  Administered 2023-11-24: 31 g via ORAL
  Filled 2023-11-24: qty 1.21

## 2023-11-24 MED ORDER — DEXTROSE 50 % IV SOLN: 1.0000 | INTRAVENOUS | Status: DC | PRN

## 2023-11-24 MED ORDER — POTASSIUM CHLORIDE 20 MEQ PO PACK
40.0000 meq | PACK | Freq: Once | ORAL | Status: AC
  Administered 2023-11-24: 40 meq via ORAL
  Filled 2023-11-24: qty 2

## 2023-11-24 MED ORDER — TORSEMIDE 40 MG PO TABS: 40.0000 mg | ORAL_TABLET | Freq: Every day | ORAL | 0 refills | Status: DC

## 2023-11-24 NOTE — Care Management Important Message (Signed)
 Important Message  Patient Details  Name: [name] [name] MRN: [id number] Date of Birth: 04/26/1946   Important Message Given:  Yes - Medicare IM     Wynonia Hedges 11/24/2023, 3:51 PM

## 2023-11-24 NOTE — Progress Notes (Signed)
 Heart Failure Navigator Progress Note  Assessed for Heart & Vascular TOC clinic readiness.  Patient with EF 30-35% and CKD IV. Has scheduled follow up with [facility] on 6/11. Will not schedule additional HF TOC appt at this time.  Navigator available for reassessment of patient.   Jerilyn Monte, PharmD, BCPS Heart Failure Stewardship Pharmacist Phone [phone]

## 2023-11-24 NOTE — TOC CM/SW Note (Signed)
 Transition of Care [facility]) - Inpatient Brief Assessment   Patient Details  Name: [name] [name] MRN: [id number] Date of Birth: Jun 20, 1946  [facility]) CM/SW Contact:    Jennett Model, RN Phone Number: 11/24/2023, 3:10 PM   Clinical Narrative: From home with spouse, has PCP and insurance on file, states has no HH services in place at this time or DME at home.  States family member will transport them home at [employer] and family is support system, states gets medications from [place] on [address] and [place].  Pta self ambulatory.    Transition of Care Asessment: Insurance and Status: Insurance coverage has been reviewed Patient has primary care physician: Yes Home environment has been reviewed: home with wife Prior level of function:: indep Prior/Current Home Services: No current home services Social Drivers of Health Review: SDOH reviewed no interventions necessary Readmission risk has been reviewed: Yes Transition of care needs: no transition of care needs at this time

## 2023-11-24 NOTE — Progress Notes (Signed)
 At 2:15AM patient's CBG was 61. Hypoglycemic Order Set placed and MD notified. 31g of dextrose  oral gel given. CBG rechecked after 15 min., the result was 156.

## 2023-11-24 NOTE — TOC Transition Note (Signed)
 Transition of Care [facility]) - Discharge Note   Patient Details  Name: [name] [name] MRN: [id number] Date of Birth: [birth year]  Transition of Care [facility]) CM/SW Contact:  Jennett Model, RN Phone Number: 11/24/2023, 3:34 PM   Clinical Narrative:    For dc, wife at bedside to transport home.         Patient Goals and CMS Choice            Discharge Placement                       Discharge Plan and Services Additional resources added to the After Visit Summary for                                       Social Drivers of Health (SDOH) Interventions SDOH Screenings   Food Insecurity: No Food Insecurity (11/21/2023)  Housing: Low Risk  (11/21/2023)  Transportation Needs: No Transportation Needs (11/21/2023)  Utilities: Not At Risk (11/21/2023)  Alcohol Screen: Low Risk  (08/25/2023)  Depression (PHQ2-9): Low Risk  (08/25/2023)  Financial Resource Strain: Low Risk  (08/25/2023)  Physical Activity: Inactive (08/25/2023)  Social Connections: Socially Integrated (11/21/2023)  Stress: No Stress Concern Present (08/25/2023)  Tobacco Use: Low Risk  (11/21/2023)  Health Literacy: Adequate Health Literacy (08/25/2023)     Readmission Risk Interventions    11/24/2023    3:09 PM  Readmission Risk Prevention Plan  Transportation Screening Complete  PCP or Specialist Appt within 5-7 Days Complete  Home Care Screening Complete  Medication Review (RN CM) Complete

## 2023-11-24 NOTE — Inpatient Diabetes Management (Signed)
 Inpatient Diabetes Program Recommendations  AACE/ADA: New Consensus Statement on Inpatient Glycemic Control (2015)  Target Ranges:  Prepandial:   less than 140 mg/dL      Peak postprandial:   less than 180 mg/dL (1-2 hours)      Critically ill patients:  140 - 180 mg/dL   Lab Results  Component Value Date   GLUCAP 169 (H) 11/24/2023   HGBA1C 6.9 (A) 09/20/2023    Review of Glycemic Control  Latest Reference Range & Units 11/23/23 06:10 11/23/23 10:44 11/23/23 15:55 11/23/23 20:55 11/24/23 02:15 11/24/23 02:48 11/24/23 06:00  Glucose-Capillary 70 - 99 mg/dL 161 (H) 096 (H) 045 (H) 186 (H) 61 (L) 156 (H) 169 (H)  (H): Data is abnormally high (L): Data is abnormally low  Diabetes history: DM2, CKD IV Outpatient Diabetes medications: Lantus  9 units every day, Starlix  60 mg QD Current orders for Inpatient glycemic control: Novolog  0-6 units TID, HS, 02:00  Inpatient Diabetes Program Recommendations:    Hypoglycemia @ 02:15--61 mg/dL.  Please consider:  Novolog  0-6 TID  Novolog  2 units TID with meals (postprandials elevated)  Thank you, Hays Lipschutz, MSN, CDCES Diabetes Coordinator Inpatient Diabetes Program [phone] (team pager from 8a-5p)

## 2023-11-24 NOTE — Discharge Instructions (Signed)
 Advised to follow-up with her physician in 1 week. Advised to continue amiodarone  200 mg twice daily for 1 week followed by amiodarone  200 mg daily. Advised to follow-up with cardiology as scheduled.   Advised to take torsemide 40 mg daily.

## 2023-11-24 NOTE — Discharge Summary (Signed)
 Physician Discharge Summary  [name] [name] RUE:[id number] DOB: July 07, 1945 DOA: 11/21/2023  PCP: [name], NP  Admit date: 11/21/2023  Discharge date: 11/24/2023  Admitted From: Home  Disposition: Home.  Recommendations for Outpatient Follow-up:  Follow up with PCP in 1-2 weeks, Please obtain BMP/CBC in one week Advised to continue amiodarone  200 mg twice daily for 1 week followed by amiodarone  200 mg daily. Advised to follow-up with Cardiology as scheduled.   Advised to take torsemide 40 mg daily.  Home Health:None Equipment/Devices:None  Discharge Condition: Stable CODE STATUS:Full code Diet recommendation: Heart Healthy   Brief [facility] Hospital Course: This 78 y.o. male with medical history significant of hypertension, hyperlipidemia, CHF, atrial fibrillation on Xarelto , CVA, diabetes mellitus type 2, and chronic kidney disease stage IV who presents with shortness of breath.  Patient had just underwent cardioversion with Dr. [name] on 5/19. He did not feel well before the procedure, experiencing shortness of breath and exhaustion.Following the procedure patient reported symptoms which did not seem to improve. Patient was brought in the Ed and found to have SpO2 of 88% on room air requiring CPAP.  Patient was given sublingual nitro and aspirin  in the ED. Chest x-ray shows cardiac enlargement with small left pleural effusion, mild interstitial edema, patchy opacities within the left mid and lower lung concerning for pneumonia versus atypical edema.  Patient was given IV Lasix  and was admitted for further evaluation. Cardiology was consulted.  Medications adjusted.  Patient was started on amiodarone  infusion.  Patient converted to normal sinus Rhythm. Cardiology recommended to continue amiodarone  orally.  Patient feels better and wants to be discharged.  Cardiology signed off.  Discharge Diagnoses:  Principal Problem:   Acute on chronic heart failure with mildly reduced  ejection fraction (HFmrEF, 41-49%) (HCC) Active Problems:   Acute respiratory failure with hypoxia (HCC)   Elevated troponin   Paroxysmal atrial fibrillation (HCC)   Essential hypertension   Controlled type 2 diabetes mellitus without complication, without long-term current use of insulin  (HCC)   CKD (chronic kidney disease), stage IV (HCC)   Anemia of chronic disease   Obesity, Class I, BMI 30-34.9   Acute on chronic combined systolic and diastolic CHF (congestive heart failure) (HCC)   Persistent atrial fibrillation (HCC)   Acute on chronic HFrEF (heart failure with reduced ejection fraction) (HCC)   AKI (acute kidney injury) (HCC)  Acute hypoxic respiratory failure: Acute on chronic heart failure with reduced EF: Patient presented with shortness of breath, Hypoxia subsequently requiring BiPAP. Chest x-ray showing concern for pulmonary edema with left-sided opacities that may be pneumonia versus edema.   BNP elevated at 559.2.  Last echocardiogram revealed EF to be 45 to 50% with grade 2 diastolic dysfunction. Continuous pulse oximetry with oxygen,  Maintain O2 saturation greater than 92%  Continue BiPAP,  wean off as tolerated Check I&O's and daily weights ProCalcitonin 0.1 low suspicion for infection. Repeat echo shows LVEF 35 to 25%. Tachycardia induced IV Lasix  changed with torsemide given increase in Serum Creatinine. Hold ARB, Continue hydralazine  100 mg 3 times daily, labetalol  200 mg 3 times daily Cardiology consulted, continue with above medications.   Elevated troponin: High-sensitivity troponins noted to be 47->52.   Likely demand ischemia in the setting of acute CHF exacerbation  EKG appears similar to priors.  Previous stress test done in 06/2022 was noted to be normal.     Paroxysmal atrial fibrillation on chronic anticoagulation Patient had just underwent electrical cardioversion 5/20 with Dr. [name].   Currently  appears to be in sinus rhythm with occasional  PVCs. Continue amiodarone  and Xarelto  Patient went into A-fib with RVR again and started on amiodarone  infusion.   Overnight converted to normal sinus rhythm. Medication changed to amiodarone  p.o. Cardiology following.  Cardiology signed off.   Essential hypertension: Blood pressures currently elevated 152/97-159/94. Continue amlodipine , hydralazine , labetalol , and hold valsartan   Diabetes mellitus type 2, with long-term use of insulin : Last hemoglobin A1c noted to be 6.9 when checked 09/23/2023.   Home medication regimen includes Lantus  9 units daily and nateglinide  Continue Hypoglycemic protocols Held nateglinide  Continue pharmacy substitution for Lantus  CBGs before every meal with very sensitive SSI  Adjust insulin  regimen as needed   Chronic kidney disease stage IV: Creatinine noted to be 2.9 which appears around patient's baseline of 3-3.1. Continue to monitor kidney function with diuresis   Normocytic anemia: Hemoglobin 11.3 which appears around patient's baseline which ranges from 11-12. - Continue to monitor   Obesity BMI 30.85 kg/m  Discharge Instructions  Discharge Instructions     Call MD for:  difficulty breathing, headache or visual disturbances   Complete by: As directed    Call MD for:  persistant dizziness or light-headedness   Complete by: As directed    Call MD for:  persistant nausea and vomiting   Complete by: As directed    Diet - low sodium heart healthy   Complete by: As directed    Diet Carb Modified   Complete by: As directed    Discharge instructions   Complete by: As directed    Advised to follow-up with her physician in 1 week. Advised to continue amiodarone  200 mg twice daily for 1 week followed by amiodarone  200 mg daily. Advised to follow-up with cardiology as scheduled.   Advised to take torsemide 40 mg daily.   Increase activity slowly   Complete by: As directed       Allergies as of 11/24/2023       Reactions   Evolocumab   Hypertension   5-alpha Reductase Inhibitors Other (See Comments)   myalgia   Iodinated Contrast Media    Avoid due to chronic kidney disease    Penicillins Hives   Statins    myalgia   Sulfa Antibiotics Hives, Itching, Swelling   Sulfa Drugs Cross Reactors Hives, Itching, Swelling   Sulfamethoxazole-trimethoprim Hives, Itching, Swelling        Medication List     STOP taking these medications    valsartan 160 MG tablet Commonly known as: DIOVAN       TAKE these medications    amiodarone  200 MG tablet Commonly known as: PACERONE  Take 1 tablet (200 mg total) by mouth 2 (two) times daily for 7 days, THEN 1 tablet (200 mg total) daily. Start taking on: Nov 24, 2023 What changed: See the new instructions.   amLODipine  5 MG tablet Commonly known as: NORVASC  Take 1 tablet (5 mg total) by mouth daily. Start taking on: Nov 25, 2023 What changed: when to take this   calcitRIOL  0.25 MCG capsule Commonly known as: ROCALTROL  Take 1 capsule by mouth every other day.   Cholecalciferol  50 MCG (2000 UT) Caps Take 1 capsule by mouth daily.   ezetimibe  10 MG tablet Commonly known as: ZETIA  TAKE 1 TABLET(10 MG) BY MOUTH DAILY   ferrous sulfate 325 (65 FE) MG tablet Take 325 mg by mouth 2 (two) times daily with a meal.   hydrALAZINE  100 MG tablet Commonly known as: APRESOLINE  Take 100 mg  by mouth 3 (three) times daily.   labetalol  200 MG tablet Commonly known as: NORMODYNE  TAKE 1 TABLET(200 MG) BY MOUTH THREE TIMES DAILY   Lantus  SoloStar 100 UNIT/ML Solostar Pen Generic drug: insulin  glargine Inject 9 Units into the skin daily.   latanoprost 0.005 % ophthalmic solution Commonly known as: XALATAN Place 1 drop into both eyes at bedtime.   nateglinide  60 MG tablet Commonly known as: STARLIX  Take 60 mg by mouth daily.   OneTouch Verio test strip Generic drug: glucose blood USE TO TEST TWICE DAILY AS DIRECTED   OneTouch Verio w/Device Kit Use with existing test  strips   Rivaroxaban  15 MG Tabs tablet Commonly known as: XARELTO  Take 1 tablet (15 mg total) by mouth daily with supper.   timolol 0.5 % ophthalmic solution Commonly known as: TIMOPTIC Place 1 drop into both eyes 2 (two) times daily.   Torsemide 40 MG Tabs Take 40 mg by mouth daily. Start taking on: Nov 25, 2023   Vitamin B Complex Tabs Take 1 tablet by mouth every morning.        Follow-up Information     Nafziger, [name], NP Follow up in 1 week(s).   Specialty: Family Medicine Contact information: [address] [place] [address] [phone]         Wendie Hamburg, MD Follow up in 1 week(s).   Specialties: Cardiology, Radiology Contact information: [address] [place] [address] [phone]                Allergies  Allergen Reactions   Evolocumab  Hypertension   5-Alpha Reductase Inhibitors Other (See Comments)    myalgia    Iodinated Contrast Media     Avoid due to chronic kidney disease    Penicillins Hives   Statins     myalgia    Sulfa Antibiotics Hives, Itching and Swelling   Sulfa Drugs Cross Reactors Hives, Itching and Swelling   Sulfamethoxazole-Trimethoprim Hives, Itching and Swelling    Consultations: Cardiology   Procedures/Studies: US  EKG SITE RITE Result Date: 11/22/2023 If Site Rite image not attached, placement could not be confirmed due to current cardiac rhythm.  ECHOCARDIOGRAM COMPLETE Result Date: 11/21/2023    ECHOCARDIOGRAM REPORT   Patient Name:   [name] [name] Date of Exam: 11/21/2023 Medical Rec #:  [id number]        Height:       72.0 in Accession #:    [id number]       Weight:       227.5 lb Date of Birth:  12-06-1945        BSA:          2.250 m Patient Age:    77 years         BP:           145/95 mmHg Patient Gender: M                HR:           71 bpm. Exam Location:  Inpatient Procedure: 2D Echo, Color Doppler and Cardiac Doppler (Both Spectral and Color            Flow  Doppler were utilized during procedure). Indications:    CHF-Acute Diastolic I50.31  History:        Patient has no prior history of Echocardiogram examinations,                 most recent 07/05/2022.  Sonographer:  Hersey Lorenzo RDCS Referring Phys: [id number] RONDELL A SMITH IMPRESSIONS  1. Left ventricular ejection fraction, by estimation, is 30 to 35%. The left ventricle has moderately decreased function. The left ventricle demonstrates global hypokinesis. The left ventricular internal cavity size was mildly dilated. There is mild concentric left ventricular hypertrophy. Left ventricular diastolic parameters are consistent with Grade III diastolic dysfunction (restrictive). Elevated left atrial pressure.  2. Right ventricular systolic function is normal. The right ventricular size is mildly enlarged. There is mildly elevated pulmonary artery systolic pressure. The estimated right ventricular systolic pressure is 42.0 mmHg.  3. Left atrial size was severely dilated.  4. Right atrial size was severely dilated.  5. A small pericardial effusion is present. The pericardial effusion is circumferential. There is no evidence of cardiac tamponade.  6. The mitral valve is normal in structure. No evidence of mitral valve regurgitation. No evidence of mitral stenosis.  7. The aortic valve is tricuspid. There is mild calcification of the aortic valve. There is mild thickening of the aortic valve. Aortic valve regurgitation is not visualized. Aortic valve sclerosis/calcification is present, without any evidence of aortic stenosis.  8. The inferior vena cava is dilated in size with <50% respiratory variability, suggesting right atrial pressure of 15 mmHg. Comparison(s): Note low QRS complex voltage on ECG. Consider restrictive/infiltrative cardiomyopathy such as cardiac amyloidosis, probably with superimposed tachycardia cardiomyopathy, as well as possible mechanical atrial stunning shortly after cardioversion. FINDINGS  Left  Ventricle: Left ventricular ejection fraction, by estimation, is 30 to 35%. The left ventricle has moderately decreased function. The left ventricle demonstrates global hypokinesis. The left ventricular internal cavity size was mildly dilated. There is mild concentric left ventricular hypertrophy. Left ventricular diastolic parameters are consistent with Grade III diastolic dysfunction (restrictive). Elevated left atrial pressure. Right Ventricle: The right ventricular size is mildly enlarged. No increase in right ventricular wall thickness. Right ventricular systolic function is normal. There is mildly elevated pulmonary artery systolic pressure. The tricuspid regurgitant velocity is 2.60 m/s, and with an assumed right atrial pressure of 15 mmHg, the estimated right ventricular systolic pressure is 42.0 mmHg. Left Atrium: Left atrial size was severely dilated. Right Atrium: Right atrial size was severely dilated. Pericardium: A small pericardial effusion is present. The pericardial effusion is circumferential. There is no evidence of cardiac tamponade. Mitral Valve: The mitral valve is normal in structure. No evidence of mitral valve regurgitation. No evidence of mitral valve stenosis. Tricuspid Valve: The tricuspid valve is normal in structure. Tricuspid valve regurgitation is mild. Aortic Valve: The aortic valve is tricuspid. There is mild calcification of the aortic valve. There is mild thickening of the aortic valve. Aortic valve regurgitation is not visualized. Aortic valve sclerosis/calcification is present, without any evidence of aortic stenosis. Pulmonic Valve: The pulmonic valve was grossly normal. Pulmonic valve regurgitation is not visualized. No evidence of pulmonic stenosis. Aorta: The aortic root and ascending aorta are structurally normal, with no evidence of dilitation. Venous: The inferior vena cava is dilated in size with less than 50% respiratory variability, suggesting right atrial pressure of  15 mmHg. IAS/Shunts: No atrial level shunt detected by color flow Doppler.  LEFT VENTRICLE PLAX 2D LVIDd:         5.80 cm   Diastology LVIDs:         4.90 cm   LV e' medial:    4.24 cm/s LV PW:         1.30 cm   LV E/e' medial:  23.1 LV IVS:  1.30 cm   LV e' lateral:   4.57 cm/s LVOT diam:     2.30 cm   LV E/e' lateral: 21.4 LV SV:         85 LV SV Index:   38 LVOT Area:     4.15 cm  RIGHT VENTRICLE             IVC RV S prime:     15.60 cm/s  IVC diam: 2.90 cm TAPSE (M-mode): 2.4 cm LEFT ATRIUM              Index        RIGHT ATRIUM           Index LA diam:        5.30 cm  2.36 cm/m   RA Area:     24.20 cm LA Vol (A2C):   163.0 ml 72.43 ml/m  RA Volume:   77.10 ml  34.26 ml/m LA Vol (A4C):   160.0 ml 71.10 ml/m LA Biplane Vol: 165.0 ml 73.32 ml/m  AORTIC VALVE LVOT Vmax:   97.80 cm/s LVOT Vmean:  70.300 cm/s LVOT VTI:    0.205 m  AORTA Ao Root diam: 3.10 cm Ao Asc diam:  3.40 cm MITRAL VALVE               TRICUSPID VALVE MV Area (PHT): 5.58 cm    TR Peak grad:   27.0 mmHg MV Decel Time: 136 msec    TR Vmax:        260.00 cm/s MV E velocity: 97.90 cm/s MV A velocity: 39.90 cm/s  SHUNTS MV E/A ratio:  2.45        Systemic VTI:  0.20 m                            Systemic Diam: 2.30 cm Karyl Paget Croitoru MD Electronically signed by [name] MD Signature Date/Time: 11/21/2023/3:13:04 PM    Final    DG Chest Port 1 View Result Date: 11/21/2023 CLINICAL DATA:  Shortness of breath. EXAM: PORTABLE CHEST 1 VIEW COMPARISON:  08/09/2023 FINDINGS: Cardiac enlargement. Blunting of the left costophrenic angle. Mild increase interstitial markings. Patchy opacities identified within the left mid and left lower lung. The visualized osseous structures are unremarkable. IMPRESSION: 1. Cardiac enlargement with small left pleural effusion and mild interstitial edema. 2. Patchy opacities within the left mid and left lower lung compatible with pneumonia versus asymmetric edema. Electronically Signed   By: Kimberley Penman  M.D.   On: 11/21/2023 05:34   EP STUDY Result Date: 11/20/2023 See surgical note for result.   Subjective: Patient was seen and examined at bedside.  Overnight events noted. Patient reports doing much better.  Heart rate remains controlled.  Wants to be discharged Home.  Discharge Exam: Vitals:   11/24/23 0900 11/24/23 1042  BP:  120/67  Pulse:  69  Resp: 18 17  Temp:  97.8 F (36.6 C)  SpO2:  97%   Vitals:   11/24/23 0559 11/24/23 0745 11/24/23 0900 11/24/23 1042  BP: 139/78 133/84  120/67  Pulse:  87  69  Resp:  19 18 17   Temp:  97.8 F (36.6 C)  97.8 F (36.6 C)  TempSrc:  Oral  Oral  SpO2:  97%  97%  Weight:      Height:        General: Pt is alert, awake, not in acute distress Cardiovascular: RRR, S1/S2 +,  no rubs, no gallops Respiratory: CTA bilaterally, no wheezing, no rhonchi Abdominal: Soft, NT, ND, bowel sounds + Extremities: no edema, no cyanosis    The results of significant diagnostics from this hospitalization (including imaging, microbiology, ancillary and laboratory) are listed below for reference.     Microbiology: No results found for this or any previous visit (from the past 240 hours).   Labs: BNP (last 3 results) Recent Labs    11/21/23 0510  BNP 595.2*   Basic Metabolic Panel: Recent Labs  Lab 11/21/23 0510 11/22/23 0222 11/23/23 0248 11/24/23 0307  NA 138 141 140 140  K 3.9 3.5 3.4* 2.9*  CL 108 106 106 108  CO2 19* 22 23 23   GLUCOSE 199* 176* 176* 169*  BUN 34* 35* 36* 36*  CREATININE 2.90* 2.96* 3.26* 3.25*  CALCIUM 9.2 9.2 8.9 8.9  MG  --  1.6* 1.9 1.8  PHOS  --   --  3.7  --    Liver Function Tests: Recent Labs  Lab 11/21/23 0510  AST 14*  ALT 10  ALKPHOS 58  BILITOT 1.2  PROT 7.2  ALBUMIN 3.8   Recent Labs  Lab 11/21/23 0510  LIPASE 34   No results for input(s): "AMMONIA" in the last 168 hours. CBC: Recent Labs  Lab 11/21/23 0510 11/23/23 0248 11/24/23 0307  WBC 9.6 9.0 8.9  NEUTROABS 7.1  --    --   HGB 11.3* 11.9* 11.9*  HCT 33.7* 34.4* 34.7*  MCV 79.3* 77.5* 76.6*  PLT 296 318 304   Cardiac Enzymes: No results for input(s): "CKTOTAL", "CKMB", "CKMBINDEX", "TROPONINI" in the last 168 hours. BNP: Invalid input(s): "POCBNP" CBG: Recent Labs  Lab 11/23/23 2055 11/24/23 0215 11/24/23 0248 11/24/23 0600 11/24/23 1044  GLUCAP 186* 61* 156* 169* 267*   D-Dimer No results for input(s): "DDIMER" in the last 72 hours. Hgb A1c No results for input(s): "HGBA1C" in the last 72 hours. Lipid Profile No results for input(s): "CHOL", "HDL", "LDLCALC", "TRIG", "CHOLHDL", "LDLDIRECT" in the last 72 hours. Thyroid  function studies No results for input(s): "TSH", "T4TOTAL", "T3FREE", "THYROIDAB" in the last 72 hours.  Invalid input(s): "FREET3" Anemia work up No results for input(s): "VITAMINB12", "FOLATE", "FERRITIN", "TIBC", "IRON", "RETICCTPCT" in the last 72 hours. Urinalysis    Component Value Date/Time   COLORURINE yellow 07/14/2010 0926   APPEARANCEUR Cloudy 07/14/2010 0926   LABSPEC 1.015 07/14/2010 0926   PHURINE 5.5 07/14/2010 0926   HGBUR large 07/14/2010 0926   BILIRUBINUR n 06/02/2015 1024   PROTEINUR n 06/02/2015 1024   UROBILINOGEN 0.2 06/02/2015 1024   UROBILINOGEN 0.2 07/14/2010 0926   NITRITE n 06/02/2015 1024   NITRITE negative 07/14/2010 0926   LEUKOCYTESUR Negative 06/02/2015 1024   Sepsis Labs Recent Labs  Lab 11/21/23 0510 11/23/23 0248 11/24/23 0307  WBC 9.6 9.0 8.9   Microbiology No results found for this or any previous visit (from the past 240 hours).   Time coordinating discharge: Over 30 minutes  SIGNED:   Magdalene School, MD  [facility] 11/24/2023, 1:59 PM Pager   If 7PM-7AM, please contact night-coverage

## 2023-11-24 NOTE — Plan of Care (Signed)
  Problem: Metabolic: Goal: Ability to maintain appropriate glucose levels will improve 11/24/2023 0527 by Delinda Fendt, RN Outcome: Progressing 11/24/2023 0527 by Delinda Fendt, RN Outcome: Progressing   Problem: Clinical Measurements: Goal: Will remain free from infection 11/24/2023 0527 by Delinda Fendt, RN Outcome: Progressing 11/24/2023 0527 by Delinda Fendt, RN Outcome: Progressing   Problem: Clinical Measurements: Goal: Diagnostic test results will improve 11/24/2023 0527 by Delinda Fendt, RN Outcome: Progressing 11/24/2023 0527 by Delinda Fendt, RN Outcome: Progressing

## 2023-11-24 NOTE — Progress Notes (Signed)
  RN reported that patient has sustained runs of V. tach at 4 AM.     Per chart review patient has persistent atrial fibrillation status post DCCV treated with amiodarone  drip and transitioned to oral amiodarone  and currently on Toprol -XL. -If patient continues to have sustained V. tach will need further ablation.  Juelz Whittenberg, MD [facility] 11/24/2023, 6:01 AM

## 2023-11-24 NOTE — Plan of Care (Signed)
  Problem: Education: Goal: Ability to describe self-care measures that may prevent or decrease complications (Diabetes Survival Skills Education) will improve Outcome: Progressing   Problem: Coping: Goal: Ability to adjust to condition or change in health will improve Outcome: Progressing   Problem: Fluid Volume: Goal: Ability to maintain a balanced intake and output will improve Outcome: Progressing   Problem: Health Behavior/Discharge Planning: Goal: Ability to identify and utilize available resources and services will improve Outcome: Progressing   Problem: Health Behavior/Discharge Planning: Goal: Ability to manage health-related needs will improve Outcome: Progressing   Problem: Metabolic: Goal: Ability to maintain appropriate glucose levels will improve Outcome: Progressing

## 2023-11-24 NOTE — Progress Notes (Signed)
 Rounding Note    Patient Name: [name] [name] Date of Encounter: 11/24/2023  [facility] Cardiologist: [name], MD   Subjective   BP 133/84. Creatinine stable (2.96 > 3.26 >3.25).   He reports dyspnea improved  Inpatient Medications    Scheduled Meds:  amiodarone   200 mg Oral BID   Followed by   Cecily Cohen ON 11/30/2023] amiodarone   200 mg Oral Daily   amLODipine   5 mg Oral Daily   calcitRIOL   0.25 mcg Oral QODAY   ezetimibe   10 mg Oral Daily   feeding supplement  237 mL Oral BID BM   hydrALAZINE   100 mg Oral Q8H   insulin  aspart  0-6 Units Subcutaneous TID PC,HS,0200   insulin  glargine-yfgn  9 Units Subcutaneous Daily   labetalol   200 mg Oral TID   latanoprost  1 drop Both Eyes QHS   Rivaroxaban   15 mg Oral Q supper   sodium chloride  flush  3 mL Intravenous Q12H   timolol  1 drop Both Eyes BID   torsemide  40 mg Oral Daily   Continuous Infusions:   PRN Meds: acetaminophen , dextrose , metoprolol  tartrate, ondansetron (ZOFRAN) IV, sodium chloride  flush   Vital Signs    Vitals:   11/24/23 0401 11/24/23 0559 11/24/23 0745 11/24/23 0900  BP: (!) 149/80 139/78 133/84   Pulse: 70  87   Resp: 11  19 18   Temp: 99 F (37.2 C)  97.8 F (36.6 C)   TempSrc: Oral  Oral   SpO2: 96%  97%   Weight: 95.8 kg     Height:        Intake/Output Summary (Last 24 hours) at 11/24/2023 1010 Last data filed at 11/24/2023 0851 Gross per 24 hour  Intake 732.69 ml  Output --  Net 732.69 ml      11/24/2023    4:01 AM 11/23/2023    6:11 AM 11/22/2023    4:56 AM  Last 3 Weights  Weight (lbs) 211 lb 3.2 oz 213 lb 14.4 oz 217 lb 3.2 oz  Weight (kg) 95.8 kg 97.024 kg 98.521 kg      Telemetry    Normal sinus rhythm, PVCs- Personally Reviewed  ECG    No new ECG- Personally Reviewed  Physical Exam   GEN: No acute distress.   Neck: No JVD Cardiac: RRR, no murmurs, rubs, or gallops.  Respiratory: Diminished breath sounds GI: Soft, nontender, non-distended  MS:  Trace edema; No deformity. Neuro:  Nonfocal  Psych: Normal affect   Labs    High Sensitivity Troponin:   Recent Labs  Lab 11/21/23 0510 11/21/23 0722  TROPONINIHS 47* 52*     Chemistry Recent Labs  Lab 11/21/23 0510 11/22/23 0222 11/23/23 0248 11/24/23 0307  NA 138 141 140 140  K 3.9 3.5 3.4* 2.9*  CL 108 106 106 108  CO2 19* 22 23 23   GLUCOSE 199* 176* 176* 169*  BUN 34* 35* 36* 36*  CREATININE 2.90* 2.96* 3.26* 3.25*  CALCIUM 9.2 9.2 8.9 8.9  MG  --  1.6* 1.9 1.8  PROT 7.2  --   --   --   ALBUMIN 3.8  --   --   --   AST 14*  --   --   --   ALT 10  --   --   --   ALKPHOS 58  --   --   --   BILITOT 1.2  --   --   --   [facility]  22* 21* 19* 19*  ANIONGAP 11 13 11 9     Lipids No results for input(s): "CHOL", "TRIG", "HDL", "LABVLDL", "LDLCALC", "CHOLHDL" in the last 168 hours.  Hematology Recent Labs  Lab 11/21/23 0510 11/23/23 0248 11/24/23 0307  WBC 9.6 9.0 8.9  RBC 4.25 4.44 4.53  HGB 11.3* 11.9* 11.9*  HCT 33.7* 34.4* 34.7*  MCV 79.3* 77.5* 76.6*  MCH 26.6 26.8 26.3  MCHC 33.5 34.6 34.3  RDW 14.9 14.6 14.6  PLT 296 318 304   Thyroid  No results for input(s): "TSH", "FREET4" in the last 168 hours.  BNP Recent Labs  Lab 11/21/23 0510  BNP 595.2*    DDimer No results for input(s): "DDIMER" in the last 168 hours.   Radiology    US  EKG SITE RITE Result Date: 11/22/2023 If Site Rite image not attached, placement could not be confirmed due to current cardiac rhythm.   Cardiac Studies     Patient Profile     78 y.o. male with a hx of HTN, HLD, T2DM, stroke (2019), CKD stage 4, chronic combined CHF, carotid artery disease, gout, chronic prostatitis, chronic anemia who is being seen 11/21/2023 for the evaluation of acute on chronic CHF   Assessment & Plan    Acute on chronic combined CHF  Chronic dyspnea on exertion Hypertension  Echo from 07/2022 showed: EF 45-50%.  Echo this admission shows EF 30 to 35% BNP elevated at 595 CXR showed  cardiomegaly, pulmonary edema Troponin level 47 > 52, likely demand in setting of acute CHF exacerbation  Suspect worsening systolic function is tachycardia induced.  He underwent successful cardioversion on 5/19.  Not currently candidate for invasive evaluation given recent cardioversion and his CKD stage IV Has diuresed well, appears euvolemic, transitioned to p.o. torsemide Hold irbesartan given worsening renal function Replete K greater than 4, mag greater than 2   Persistent atrial fibrillation s/p DCCV  Recent successful cardioversion 5/19  Awaiting PVI + posterior wall + CTI ablation in future Went into A-fib yesterday afternoon, started on amiodarone  drip and overnight converted back to sinus rhythm.   Currently on amiodarone  gtt, Xarelto  15 mg.  Has converted back to sinus rhythm, transitioned  from amiodarone  drip to p.o. amiodarone .  Will plan for amiodarone  200 mg twice daily x 1 week then can drop to 200 mg daily    Hyperlipidemia  03/21/2023: HDL 39.90; LDL Cholesterol 98 11/21/2023: ALT 10  Currently on Zetia  10 mg daily Reports intolerant to statins    AKI on CKD stage 4  Closely monitor renal function with diuresis.  Creatinine 3.35 today  Hypokalemia Potassium 2.9 today, repleted.  Will recheck bmet prior to discharge  Per primary team Diabetes  History of CVA  Anemia Carotid arterial disease    [facility] will sign off.   Medication Recommendations: Amlodipine  5 mg twice daily, hydralazine  100 mg 3 times daily, labetalol  200 mg 3 times daily, Xarelto  15 mg daily.  Amiodarone  200 mg twice daily x 1 week then reduce to 20 mg daily.  Torsemide 40 mg daily Other recommendations (labs, testing, etc): BMET in 1 week.  Would also recheck prior to discharge to ensure potassium improved Follow up as an outpatient: Will schedule   For questions or updates, please contact [facility] Please consult [email or web address] for contact info under         Signed, Wendie Hamburg, MD  11/24/2023, 10:10 AM

## 2023-11-24 NOTE — Progress Notes (Signed)
 Mobility Specialist Progress Note:   11/24/23 0930  Mobility  Activity Ambulated with assistance in hallway  Level of Assistance Standby assist, set-up cues, supervision of patient - no hands on  Assistive Device None  Distance Ambulated (ft) 400 ft  Activity Response Tolerated well  Mobility Referral Yes  Mobility visit 1 Mobility  Mobility Specialist Start Time (ACUTE ONLY) 0915  Mobility Specialist Stop Time (ACUTE ONLY) 0930  Mobility Specialist Time Calculation (min) (ACUTE ONLY) 15 min   Received pt in bed having no complaints and agreeable to mobility. Pt was asymptomatic throughout ambulation and returned to room w/o fault. Left in bed w/ call bell in reach and all needs met.   Inetta Manes Mobility Specialist  Please contact vis Secure Chat or  Rehab Office [phone]

## 2023-11-28 ENCOUNTER — Telehealth: Payer: Self-pay | Admitting: *Deleted

## 2023-11-28 NOTE — Transitions of Care (Post Inpatient/ED Visit) (Signed)
   11/28/2023  Name: [name] [name] MRN: [id number] DOB: 1946-06-10  Today's TOC FU Call Status: Today's TOC FU Call Status:: Unsuccessful Call (1st Attempt) Unsuccessful Call (1st Attempt) Date: 11/28/23  Attempted to reach the patient regarding the most recent Inpatient visit; left HIPAA compliant voice message requesting call back  Follow Up Plan: Additional outreach attempts will be made to reach the patient to complete the Transitions of Care (Post Inpatient visit) call.   Pls call/ message for questions,  Adebayo Ensminger Mckinney Jinelle Butchko, RN, BSN, CCRN Alumnus RN Care Manager  Transitions of Care  [facility] - [facility] [phone]: direct office

## 2023-11-29 ENCOUNTER — Encounter: Payer: Self-pay | Admitting: Adult Health

## 2023-11-29 ENCOUNTER — Ambulatory Visit: Payer: Self-pay | Admitting: Adult Health

## 2023-11-29 ENCOUNTER — Ambulatory Visit: Admitting: Adult Health

## 2023-11-29 ENCOUNTER — Telehealth: Payer: Self-pay | Admitting: *Deleted

## 2023-11-29 VITALS — BP 120/80 | HR 59 | Temp 98.2°F | Ht 75.0 in | Wt 212.0 lb

## 2023-11-29 DIAGNOSIS — I480 Paroxysmal atrial fibrillation: Secondary | ICD-10-CM

## 2023-11-29 DIAGNOSIS — E1122 Type 2 diabetes mellitus with diabetic chronic kidney disease: Secondary | ICD-10-CM

## 2023-11-29 DIAGNOSIS — R7989 Other specified abnormal findings of blood chemistry: Secondary | ICD-10-CM | POA: Diagnosis not present

## 2023-11-29 DIAGNOSIS — N184 Chronic kidney disease, stage 4 (severe): Secondary | ICD-10-CM

## 2023-11-29 DIAGNOSIS — I10 Essential (primary) hypertension: Secondary | ICD-10-CM

## 2023-11-29 DIAGNOSIS — Z794 Long term (current) use of insulin: Secondary | ICD-10-CM | POA: Diagnosis not present

## 2023-11-29 DIAGNOSIS — I5023 Acute on chronic systolic (congestive) heart failure: Secondary | ICD-10-CM

## 2023-11-29 DIAGNOSIS — J9601 Acute respiratory failure with hypoxia: Secondary | ICD-10-CM | POA: Diagnosis not present

## 2023-11-29 DIAGNOSIS — I5022 Chronic systolic (congestive) heart failure: Secondary | ICD-10-CM | POA: Diagnosis not present

## 2023-11-29 DIAGNOSIS — D649 Anemia, unspecified: Secondary | ICD-10-CM | POA: Diagnosis not present

## 2023-11-29 DIAGNOSIS — E876 Hypokalemia: Secondary | ICD-10-CM

## 2023-11-29 LAB — CBC
HCT: 36.1 % — ABNORMAL LOW (ref 39.0–52.0)
Hemoglobin: 11.9 g/dL — ABNORMAL LOW (ref 13.0–17.0)
MCHC: 32.9 g/dL (ref 30.0–36.0)
MCV: 80.1 fl (ref 78.0–100.0)
Platelets: 305 10*3/uL (ref 150.0–400.0)
RBC: 4.51 Mil/uL (ref 4.22–5.81)
RDW: 15.2 % (ref 11.5–15.5)
WBC: 7.4 10*3/uL (ref 4.0–10.5)

## 2023-11-29 LAB — BASIC METABOLIC PANEL WITH GFR
BUN: 45 mg/dL — ABNORMAL HIGH (ref 6–23)
CO2: 28 meq/L (ref 19–32)
Calcium: 9.4 mg/dL (ref 8.4–10.5)
Chloride: 101 meq/L (ref 96–112)
Creatinine, Ser: 3.92 mg/dL — ABNORMAL HIGH (ref 0.40–1.50)
GFR: 14.14 mL/min — CL (ref >60.00)
Glucose, Bld: 224 mg/dL — ABNORMAL HIGH (ref 70–99)
Potassium: 3.4 meq/L — ABNORMAL LOW (ref 3.5–5.1)
Sodium: 138 meq/L (ref 135–145)

## 2023-11-29 NOTE — Telephone Encounter (Signed)
 CRITICAL VALUE STICKER  CRITICAL VALUE: GFR 14.14  RECEIVER (on-site recipient of call): Ileene Mallick  DATE & TIME NOTIFIED: 11/29/2023 at 2:27pm  MESSENGER (representative from lab): Saa  MD NOTIFIED: [name], NP  TIME OF NOTIFICATION: 2:27pm  RESPONSE:

## 2023-11-29 NOTE — Progress Notes (Signed)
 Subjective:    Patient ID: [name] [name], male    DOB: May 16, 1946, 78 y.o.   MRN: [id number]  HPI 78 year old male who  has a past medical history of Afib (HCC), Anemia, Cataract, CHF (congestive heart failure) (HCC), CKD (chronic kidney disease) stage 4, GFR 15-29 ml/min (HCC), DIABETES MELLITUS, TYPE II (01/02/2007), HYPERLIPIDEMIA (06/18/2007), HYPERTENSION (01/02/2007), RENAL INSUFFICIENCY (02/05/2008), and Stroke (HCC) (2019).  He presents to the office today for TCM visit   Admit Date 11/21/2023 Discharge Date 11/24/2023  He originally presented to the emergency room with shortness of breath.  The day before he underwent cardioversion with Dr. [name].  He did not feel well before the procedure experiencing shortness of breath and exhaustion.  Following procedure he reports that his symptoms did not seem to improve.  He was brought to the ED and found to have an SPO2 of 88% on room air requiring BiPap  He was given a sublingual nitro and aspirin  in the ED.  His chest x-ray showed cardiac enlargement with left pleural effusion, mild interstitial edema, patchy opacities within the left mid and lower lung concerning for pneumonia versus atypical edema.  He was given IV Lasix  and was admitted for further evaluation.  Cardiology was consulted  Hospital Course  Acute hypoxic respiratory failure/acute on chronic heart failure with reduced EF. - NP was elevated at 559.2.  Last echocardiogram field an EF of 45 to 50% with grade 2 diastolic dysfunction.  Repeat echo showed an LVEF of 35 to 25%.  Tachycardia induced. -IV Lasix  was changed to torsemide  40 mg daily due to increase in serum creatinine. -ARB was held, continued on hydralazine  100 mg 3 times a day and labetalol  200 mg 3 times a day  Elevated troponin  - High sensitivity troponins noted to be 47 trended to 52.  Felt to be likely demand ischemia in the setting of acute CHF exacerbation.  His EKG was similar to priors.  Previous stress  test was done in December 2023 and noted to be normal  Proximal atrial fibrillation on chronic anticoagulation with - He went into A-fib with RVR again and was started on amiodarone  infusion.  He did convert to normal sinus rhythm.  He was placed on amiodarone  p.o. Continued on Xarelto  15 mg daily.   Essential  hypertension - Blood pressures were elevated in the 150s over 90s.  He was continued on amlodipine , hydralazine  and labetalol  and valsartan was held.  Diabetes mellitus type II with long-term use of insulin  - Hemoglobin A1c was 6.9 when checked on 09/23/2023. - nateglinide  was held and he was continued on Lantus  9 units daily.  He was given sliding scale insulin  while in the hospital  Chronic kidney disease stage IV - Creatinine noted to be 2.9 which appears around patient's baseline of 3-3.1.  Normalcytic anemia-  - Hemoglobin 11.3 which corresponds to the patient's baseline.  Today he reports that he is feeling better overall but continues to feel fatigued. He has not noticed any fluttering, palpitations or shortness of breath since being discharged from the hospital. His blood pressures have ben fairly stable as have his blood sugars. He has restarted Starlix  daily.  He is scheduled for ablation in August with Dr. [name].     Review of Systems  Constitutional:  Positive for fatigue.  Respiratory:  Positive for chest tightness and shortness of breath.   Cardiovascular: Negative.  Negative for chest pain, palpitations and leg swelling.  Gastrointestinal: Negative.   Musculoskeletal:  Negative.   Neurological: Negative.   Hematological: Negative.   Psychiatric/Behavioral: Negative.    All other systems reviewed and are negative.  Past Medical History:  Diagnosis Date   Afib (HCC)    Anemia    Cataract    CHF (congestive heart failure) (HCC)    CKD (chronic kidney disease) stage 4, GFR 15-29 ml/min (HCC)    DIABETES MELLITUS, TYPE II 01/02/2007   HYPERLIPIDEMIA  06/18/2007   HYPERTENSION 01/02/2007   RENAL INSUFFICIENCY 02/05/2008   Stroke (HCC) 2019    Social History   Socioeconomic History   Marital status: Married    Spouse name: Not on file   Number of children: Not on file   Years of education: Not on file   Highest education level: Not on file  Occupational History   Not on file  Tobacco Use   Smoking status: Never   Smokeless tobacco: Never  Vaping Use   Vaping status: Never Used  Substance and Sexual Activity   Alcohol use: No   Drug use: No   Sexual activity: Not on file  Other Topics Concern   Not on file  Social History Narrative   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 Likes to go to football games.    Social Drivers of Corporate investment banker Strain: Low Risk  (08/25/2023)   Overall Financial Resource Strain (CARDIA)    Difficulty of Paying Living Expenses: Not hard at all  Food Insecurity: No Food Insecurity (11/21/2023)   Hunger Vital Sign    Worried About Running Out of Food in the Last Year: Never true    Ran Out of Food in the Last Year: Never true  Transportation Needs: No Transportation Needs (11/21/2023)   PRAPARE - Administrator, Civil Service (Medical): No    Lack of Transportation (Non-Medical): No  Physical Activity: Inactive (08/25/2023)   Exercise Vital Sign    Days of Exercise per Week: 0 days    Minutes of Exercise per Session: 0 min  Stress: No Stress Concern Present (08/25/2023)   [employer] of Occupational Health - Occupational Stress Questionnaire    Feeling of Stress : Not at all  Social Connections: Socially Integrated (11/21/2023)   Social Connection and Isolation Panel [NHANES]    Frequency of Communication with Friends and Family: More than three times a week    Frequency of Social Gatherings with Friends and Family: More than three times a week     Attends Religious Services: More than 4 times per year    Active Member of [employer] or Organizations: Yes    Attends Banker Meetings: More than 4 times per year    Marital Status: Married  Catering manager Violence: Not At Risk (11/21/2023)   Humiliation, Afraid, Rape, and Kick questionnaire    Fear of Current or Ex-Partner: No    Emotionally Abused: No    Physically Abused: No    Sexually Abused: No    Past Surgical History:  Procedure Laterality Date   CARDIOVERSION N/A 11/20/2023   Procedure: CARDIOVERSION;  Surgeon: [name], MD;  Location: [facility] INVASIVE CV LAB;  Service: Cardiovascular;  Laterality: N/A;   LOOP RECORDER INSERTION N/A 07/11/2017   Procedure: LOOP RECORDER INSERTION;  Surgeon: [name], MD;  Location: [facility] INVASIVE CV LAB;  Service:  Cardiovascular;  Laterality: N/A;   SHOULDER SURGERY     left   TEE WITHOUT CARDIOVERSION N/A 07/11/2017   Procedure: TRANSESOPHAGEAL ECHOCARDIOGRAM (TEE);  Surgeon: [name], MD;  Location: [facility] ENDOSCOPY;  Service: Cardiovascular;  Laterality: N/A;   TRANSURETHRAL RESECTION OF PROSTATE     history of retention/hematuria     Family History  Problem Relation Age of Onset   Hypertension Mother    Diabetes Mother    Stroke Father    Stroke Maternal Grandmother    Colon cancer Neg Hx    Esophageal cancer Neg Hx    Rectal cancer Neg Hx    Stomach cancer Neg Hx     Allergies  Allergen Reactions   Evolocumab  Hypertension   5-Alpha Reductase Inhibitors Other (See Comments)    myalgia    Iodinated Contrast Media     Avoid due to chronic kidney disease    Penicillins Hives   Statins     myalgia    Sulfa Antibiotics Hives, Itching and Swelling   Sulfa Drugs Cross Reactors Hives, Itching and Swelling   Sulfamethoxazole-Trimethoprim Hives, Itching and Swelling    Current Outpatient Medications on File Prior to Visit  Medication Sig Dispense Refill   amiodarone  (PACERONE ) 200 MG tablet Take 1 tablet (200  mg total) by mouth 2 (two) times daily for 7 days, THEN 1 tablet (200 mg total) daily. 44 tablet 0   amLODipine  (NORVASC ) 5 MG tablet Take 1 tablet (5 mg total) by mouth daily. 30 tablet 0   B Complex Vitamins (VITAMIN B COMPLEX) TABS Take 1 tablet by mouth every morning.     Blood Glucose Monitoring Suppl (ONETOUCH VERIO) w/Device KIT Use with existing test strips 1 kit kit   calcitRIOL  (ROCALTROL ) 0.25 MCG capsule Take 1 capsule by mouth every other day.     Cholecalciferol  50 MCG (2000 UT) CAPS Take 1 capsule by mouth daily.     ezetimibe  (ZETIA ) 10 MG tablet TAKE 1 TABLET(10 MG) BY MOUTH DAILY 90 tablet 3   ferrous sulfate 325 (65 FE) MG tablet Take 325 mg by mouth 2 (two) times daily with a meal.     hydrALAZINE  (APRESOLINE ) 100 MG tablet Take 100 mg by mouth 3 (three) times daily.     insulin  glargine (LANTUS  SOLOSTAR) 100 UNIT/ML Solostar Pen Inject 9 Units into the skin daily.     labetalol  (NORMODYNE ) 200 MG tablet TAKE 1 TABLET(200 MG) BY MOUTH THREE TIMES DAILY 270 tablet 3   latanoprost (XALATAN) 0.005 % ophthalmic solution Place 1 drop into both eyes at bedtime.     nateglinide  (STARLIX ) 60 MG tablet Take 60 mg by mouth daily.     ONETOUCH VERIO test strip USE TO TEST TWICE DAILY AS DIRECTED 200 strip 2   Rivaroxaban  (XARELTO ) 15 MG TABS tablet Take 1 tablet (15 mg total) by mouth daily with supper. 30 tablet 3   timolol (TIMOPTIC) 0.5 % ophthalmic solution Place 1 drop into both eyes 2 (two) times daily.     torsemide (DEMADEX) 20 MG tablet Take 2 tablets (40 mg total) by mouth daily. 60 tablet 0   No current facility-administered medications on file prior to visit.    BP 120/80   Pulse (!) 59   Temp 98.2 F (36.8 C) (Oral)   Ht 6\' 3"  (1.905 m)   Wt 212 lb (96.2 kg)   SpO2 98%   BMI 26.50 kg/m       Objective:   Physical Exam  Vitals and nursing note reviewed.  Constitutional:      Appearance: Normal appearance.  Cardiovascular:     Rate and Rhythm: Normal rate and  regular rhythm.     Pulses: Normal pulses.     Heart sounds: Normal heart sounds.  Pulmonary:     Effort: Pulmonary effort is normal.     Breath sounds: Normal breath sounds.  Musculoskeletal:        General: Normal range of motion.     Right lower leg: No edema.     Left lower leg: No edema.  Skin:    General: Skin is warm and dry.  Neurological:     General: No focal deficit present.     Mental Status: He is alert and oriented to person, place, and time.  Psychiatric:        Mood and Affect: Mood normal.        Behavior: Behavior normal.        Thought Content: Thought content normal.        Judgment: Judgment normal.        Assessment & Plan:  1. Acute respiratory failure with hypoxia (HCC) (Primary) - Reviewed hospital notes, discharge instructions, labs, imaging and medication changes with the patient and his wife. All questions answered to the best of my ability.   2. Acute on chronic HFrEF (heart failure with reduced ejection fraction) (HCC) - Resolved. Euvolemic in the office today  - Continue with Torsemide until seen by Cardiology  - Basic Metabolic Panel; Future - CBC; Future - CBC - Basic Metabolic Panel  3. Elevated troponin  - Basic Metabolic Panel; Future - CBC; Future - CBC - Basic Metabolic Panel  4. Paroxysmal atrial fibrillation (HCC) - SR today in the office. Continue with current therapy  - Basic Metabolic Panel; Future - CBC; Future - CBC - Basic Metabolic Panel  5. Essential hypertension - At goal today in the office  - Basic Metabolic Panel; Future - CBC; Future - CBC - Basic Metabolic Panel  6. Type 2 diabetes mellitus with stage 4 chronic kidney disease, with long-term current use of insulin  (HCC) - Continue with current therapy  - Basic Metabolic Panel; Future - CBC; Future - CBC - Basic Metabolic Panel  7. CKD (chronic kidney disease) stage 4, GFR 15-29 ml/min (HCC) - Per nephrology.  - Basic Metabolic Panel; Future - CBC;  Future - CBC - Basic Metabolic Panel  8. Normocytic anemia - Continue to monitor  - Basic Metabolic Panel; Future - CBC; Future - CBC - Basic Metabolic Panel  [name], NP

## 2023-11-30 ENCOUNTER — Telehealth: Payer: Self-pay

## 2023-11-30 NOTE — Transitions of Care (Post Inpatient/ED Visit) (Signed)
   11/30/2023  Name: [name] [name] MRN: [id number] DOB: 01-13-1946  Today's TOC FU Call Status: Today's TOC FU Call Status:: Unsuccessful Call (2nd Attempt) Unsuccessful Call (2nd Attempt) Date: 11/30/23  Attempted to reach the patient regarding the most recent Inpatient/ED visit.  Follow Up Plan: Additional outreach attempts will be made to reach the patient to complete the Transitions of Care (Post Inpatient/ED visit) call.   Orpha Blade, RN, BSN, CEN [employer]- Transition of Care Team.  [facility] [phone]

## 2023-12-01 ENCOUNTER — Telehealth: Payer: Self-pay

## 2023-12-01 NOTE — Transitions of Care (Post Inpatient/ED Visit) (Signed)
 12/01/2023  Name: [name] [name] MRN: [id number] DOB: 1945-08-11  Today's TOC FU Call Status: Today's TOC FU Call Status:: Successful TOC FU Call Completed TOC FU Call Complete Date: 12/01/23 Patient's Name and Date of Birth confirmed.  Transition Care Management Follow-up Telephone Call Date of Discharge: 11/24/23 Discharge Facility: [name]) Type of Discharge: Inpatient Admission Primary Inpatient Discharge Diagnosis:: Acute on chronic heart failure with small pleural effusion How have you been since you were released from the hospital?: Same Any questions or concerns?: No  Items Reviewed: Did you receive and understand the discharge instructions provided?: No Medications obtained,verified, and reconciled?: Yes (Medications Reviewed) Any new allergies since your discharge?: No Dietary orders reviewed?: Yes Type of Diet Ordered:: Heart Healthy low sodium Do you have support at home?: Yes People in Home [RPT]: spouse Name of Support/Comfort Primary Source: [name]  Medications Reviewed Today: Medications Reviewed Today     Reviewed by Matilde Pottenger, RN (Case Manager) on 12/01/23 at 1024  Med List Status: <None>   Medication Order Taking? Sig Documenting Provider Last Dose Status Informant  amiodarone  (PACERONE ) 200 MG tablet [id number] Yes Take 1 tablet (200 mg total) by mouth 2 (two) times daily for 7 days, THEN 1 tablet (200 mg total) daily. Magdalene School, MD Taking Active   amLODipine  (NORVASC ) 5 MG tablet [id number] Yes Take 1 tablet (5 mg total) by mouth daily. Magdalene School, MD Taking Active   B Complex Vitamins (VITAMIN B COMPLEX) TABS [id number] Yes Take 1 tablet by mouth every morning. [provider] Taking Active Spouse/Significant Other  Blood Glucose Monitoring Suppl (ONETOUCH VERIO) w/Device Suzanne Erps [id number] Yes Use with existing test strips Nafziger, [name], NP Taking Active Spouse/Significant Other  calcitRIOL  (ROCALTROL ) 0.25 MCG capsule [id number]  Yes Take 1 capsule by mouth every other day. [provider] Taking Active Spouse/Significant Other  Cholecalciferol  50 MCG (2000 UT) CAPS [id number]  Take 1 capsule by mouth daily. [provider]  Active Spouse/Significant Other  ezetimibe  (ZETIA ) 10 MG tablet [id number] Yes TAKE 1 TABLET(10 MG) BY MOUTH DAILY Nafziger, [name], NP Taking Active Spouse/Significant Other  ferrous sulfate 325 (65 FE) MG tablet [id number] Yes Take 325 mg by mouth 2 (two) times daily with a meal. [provider] Taking Active Spouse/Significant Other           Med Note Shann Darnel, Jenine Mix A   Thu Nov 16, 2023  9:52 AM)    hydrALAZINE  (APRESOLINE ) 100 MG tablet [id number] Yes Take 100 mg by mouth 3 (three) times daily. [provider] Taking Active Spouse/Significant Other  insulin  glargine (LANTUS  SOLOSTAR) 100 UNIT/ML Solostar Pen [id number] Yes Inject 9 Units into the skin daily. [provider] Taking Active Spouse/Significant Other           Med Note Merlyn Starring, Gearldean Keepers   Tue Nov 21, 2023  8:13 AM) Patient's spouse states this medication was on hold due to the procedure so he did not have this yesterday.  labetalol  (NORMODYNE ) 200 MG tablet [id number] Yes TAKE 1 TABLET(200 MG) BY MOUTH THREE TIMES DAILY Nafziger, [name], NP Taking Active Spouse/Significant Other  latanoprost (XALATAN) 0.005 % ophthalmic solution [id number] Yes Place 1 drop into both eyes at bedtime. [provider] Taking Active Spouse/Significant Other           Med Note Merlyn Starring, Gearldean Keepers   Tue Nov 21, 2023  8:14 AM) Patient's spouse states that the patient is out of this medication and is needing a refill; states that she has  contacted patient's pharmacy.  nateglinide  (STARLIX ) 60 MG tablet [id number] Yes Take 60 mg by mouth daily. [provider] Taking Active Spouse/Significant Other  ONETOUCH VERIO test strip [id number] Yes USE TO TEST TWICE DAILY AS DIRECTED Nafziger, [name], NP Taking Active Spouse/Significant  Other  Rivaroxaban  (XARELTO ) 15 MG TABS tablet [id number] Yes Take 1 tablet (15 mg total) by mouth daily with supper. [name], PA-C Taking Active Spouse/Significant Other  timolol (TIMOPTIC) 0.5 % ophthalmic solution [id number] Yes Place 1 drop into both eyes 2 (two) times daily. [provider] Taking Active Spouse/Significant Other  torsemide (DEMADEX) 20 MG tablet [id number] Yes Take 2 tablets (40 mg total) by mouth daily. Ervin Heath, [place] Taking Active             Home Care and Equipment/Supplies: Were Home Health Services Ordered?: No Any new equipment or medical supplies ordered?: No  Functional Questionnaire: Do you need assistance with bathing/showering or dressing?: Yes (wife assists as needed) Do you need assistance with meal preparation?: No Do you need assistance with eating?: No Do you have difficulty maintaining continence: No Do you need assistance with getting out of bed/getting out of a chair/moving?: No Do you have difficulty managing or taking your medications?: Yes (wife helps managing medication)  Follow up appointments reviewed: PCP Follow-up appointment confirmed?: Yes Date of PCP follow-up appointment?: 11/29/23 Follow-up Provider: [facility] Follow-up appointment confirmed?: Yes Date of Specialist follow-up appointment?: 12/13/23 Follow-Up Specialty Provider:: Cardiology Do you need transportation to your follow-up appointment?: No Do you understand care options if your condition(s) worsen?: Yes-patient verbalized understanding  SDOH Interventions Today    Flowsheet Row Most Recent Value  SDOH Interventions   Food Insecurity Interventions Intervention Not Indicated  Housing Interventions Intervention Not Indicated  Transportation Interventions Intervention Not Indicated  Utilities Interventions Intervention Not Indicated       Amelie Caracci J. Leiam Hopwood RN, MSN [facility]  [facility], [facility]  Health RN Care Manager Direct Dial: [phone]  Fax: [phone] Website: [email or web address]

## 2023-12-01 NOTE — Patient Instructions (Signed)
 Visit Information  Thank you for taking time to visit with me today. Please don't hesitate to contact me if I can be of assistance to you before our next scheduled telephone appointment.  Heart Failure Management Discussed Please weight daily or as ordered by your doctor Report to your doctor weight gain of 2 -3 pounds in a day or 5 pounds in a week Limit salt intake Monitor for shortness of breath, swelling of feet, ankles or abdomen and weight gain. Never use the saltshaker.  Read all food labels and avoid canned, processed, and pickled foods.   Follow your doctor's recommendations for daily salt intake   Patient verbalizes understanding of instructions and care plan provided today and agrees to view in MyChart. Active MyChart status and patient understanding of how to access instructions and care plan via MyChart confirmed with patient.     The patient has been provided with contact information for the care management team and has been advised to call with any health related questions or concerns.   Please call the care guide team at [phone] if you need to cancel or reschedule your appointment.   Please call the Suicide and Crisis Lifeline: 988 if you are experiencing a Mental Health or Behavioral Health Crisis or need someone to talk to.  Gumaro Brightbill J. Andie Mungin RN, MSN [facility], [facility] RN Care Manager Direct Dial: [phone]  Fax: [phone] Website: [email or web address]

## 2023-12-04 ENCOUNTER — Ambulatory Visit: Admitting: Adult Health

## 2023-12-04 ENCOUNTER — Encounter: Payer: Self-pay | Admitting: Adult Health

## 2023-12-04 VITALS — BP 148/71 | HR 52 | Ht 75.0 in | Wt 210.6 lb

## 2023-12-04 DIAGNOSIS — N184 Chronic kidney disease, stage 4 (severe): Secondary | ICD-10-CM

## 2023-12-04 DIAGNOSIS — R590 Localized enlarged lymph nodes: Secondary | ICD-10-CM | POA: Diagnosis not present

## 2023-12-04 DIAGNOSIS — I4819 Other persistent atrial fibrillation: Secondary | ICD-10-CM | POA: Diagnosis not present

## 2023-12-04 DIAGNOSIS — I5043 Acute on chronic combined systolic (congestive) and diastolic (congestive) heart failure: Secondary | ICD-10-CM | POA: Diagnosis not present

## 2023-12-04 DIAGNOSIS — J9601 Acute respiratory failure with hypoxia: Secondary | ICD-10-CM

## 2023-12-04 NOTE — Assessment & Plan Note (Signed)
Continue on current regimen.  Continue follow-up with cardiology 

## 2023-12-04 NOTE — Patient Instructions (Addendum)
 CT chest as planned this month .  Follow up with Cardiology, Nephrology .  Follow up with Dr. Baldwin Levee  in 6 weeks and As needed  -30 min slot .

## 2023-12-04 NOTE — Assessment & Plan Note (Signed)
 Mediastinal adenopathy noted on CT chest March 2025 questionable etiology.  Patient has a follow-up CT chest planned later this month.  Will follow-up with Dr. Baldwin Levee to review CT scan results.  If still present consider PET scan.  May need to proceed with tissue sampling with EBus   Plan  Patient Instructions  CT chest as planned this month .  Follow up with Cardiology, Nephrology .  Follow up with Dr. Baldwin Levee  in 6 weeks and As needed  -30 min slot .

## 2023-12-04 NOTE — Progress Notes (Signed)
 @Patient  ID: [name] [name], male    DOB: 1945/11/20, 78 y.o.   MRN: [id number]  Chief Complaint  Patient presents with   Follow-up    Heart Failure / Bipap    Referring provider: [name], NP  HPI: 78 year old male never smoker seen for sleep consult October 2024 for restless sleep, and daytime sleepiness -in-lab sleep study negative for sleep apnea Retired Actor history significant for hypertension, stage IV chronic kidney disease, congestive heart failure, stroke, diabetes  TEST/EVENTS :  In-lab sleep study June 16, 2023 showed no significant sleep apnea with AHI at 1.0/hour and no significant desaturations with minimum O2 saturations at 91%. Positive snoring.  PLM negative (sleep efficiency 41%)  Walk test August 09, 2023 no significant desaturations O2 saturation 98% on room air.  12/04/2023 Follow up : Abnormal CT chest  Patient was seen in October 2024 for possible sleep apnea.  He had restless sleep and daytime sleepiness.  He was set up for an in lab sleep study completed December 2024 that was negative for sleep apnea.  AHI was 1.0/hour with no significant desaturations. Patient was last seen in February 2025.  During that visit patient had complained of some ongoing shortness of breath with activity.  Walk test in the office showed no significant desaturations with O2 saturations at 98 to 100% on room air.  Chest x-ray was done that showed mild interstitial coarsening and bronchitic changes.  Patient was set up for a CT chest that was completed on September 19, 2023 that showed trace interstitial edema and right pleural effusion.  Positive mediastinal adenopathy measuring up to 21 mm.  Patient was set up for a follow-up CT chest later this month. Since last visit patient was hospitalized last week for acute on chronic heart failure.  Patient has a history of A-fib.  Underwent cardioversion on May 19, developed worsening shortness of breath.  Found to be  hypoxic in the emergency room.  Was started on BiPAP therapy.  Chest x-ray showed a small left pleural effusion and mild interstitial edema with patchy opacities in the left lung.  He was treated with IV diuresis with clinical improvement. 2D echo showed decreased EF at 30 to 35%.  Patient has stage IV chronic kidney disease.  He is followed by nephrology.  Since discharge patient says he is feeling better.  His shortness of breath has decreased.  He remains very weak.  He has minimum dry cough.  No fever.  Has no history of asthma or COPD.  He is a never smoker. Started on Amiodarone  April 2025.  Leg swelling is much less. Weight is down 16lbs.     Allergies  Allergen Reactions   Evolocumab  Hypertension   5-Alpha Reductase Inhibitors Other (See Comments)    myalgia    Iodinated Contrast Media     Avoid due to chronic kidney disease    Penicillins Hives   Statins     myalgia    Sulfa Antibiotics Hives, Itching and Swelling   Sulfa Drugs Cross Reactors Hives, Itching and Swelling   Sulfamethoxazole-Trimethoprim Hives, Itching and Swelling    Immunization History  Administered Date(s) Administered   Influenza, High Dose Seasonal PF 06/19/2018   PFIZER(Purple Top)SARS-COV-2 Vaccination 01/18/2020, 02/08/2020, 05/21/2021   Pfizer(Comirnaty )Fall Seasonal Vaccine 12 years and older 11/18/2022   Pneumococcal Conjugate-13 01/03/2019   Pneumococcal Polysaccharide-23 12/20/2011   Td 11/13/2009    Past Medical History:  Diagnosis Date   Afib (HCC)    Anemia  Cataract    CHF (congestive heart failure) (HCC)    CKD (chronic kidney disease) stage 4, GFR 15-29 ml/min (HCC)    DIABETES MELLITUS, TYPE II 01/02/2007   HYPERLIPIDEMIA 06/18/2007   HYPERTENSION 01/02/2007   RENAL INSUFFICIENCY 02/05/2008   Stroke (HCC) 2019    Tobacco History: Social History   Tobacco Use  Smoking Status Never  Smokeless Tobacco Never   Counseling given: Not Answered   Outpatient Medications  Prior to Visit  Medication Sig Dispense Refill   amiodarone  (PACERONE ) 200 MG tablet Take 1 tablet (200 mg total) by mouth 2 (two) times daily for 7 days, THEN 1 tablet (200 mg total) daily. 44 tablet 0   amLODipine  (NORVASC ) 5 MG tablet Take 1 tablet (5 mg total) by mouth daily. 30 tablet 0   B Complex Vitamins (VITAMIN B COMPLEX) TABS Take 1 tablet by mouth every morning.     Blood Glucose Monitoring Suppl (ONETOUCH VERIO) w/Device KIT Use with existing test strips 1 kit kit   calcitRIOL  (ROCALTROL ) 0.25 MCG capsule Take 1 capsule by mouth every other day.     Cholecalciferol  50 MCG (2000 UT) CAPS Take 1 capsule by mouth daily.     ezetimibe  (ZETIA ) 10 MG tablet TAKE 1 TABLET(10 MG) BY MOUTH DAILY 90 tablet 3   ferrous sulfate 325 (65 FE) MG tablet Take 325 mg by mouth 2 (two) times daily with a meal.     hydrALAZINE  (APRESOLINE ) 100 MG tablet Take 100 mg by mouth 3 (three) times daily.     insulin  glargine (LANTUS  SOLOSTAR) 100 UNIT/ML Solostar Pen Inject 9 Units into the skin daily.     labetalol  (NORMODYNE ) 200 MG tablet TAKE 1 TABLET(200 MG) BY MOUTH THREE TIMES DAILY 270 tablet 3   latanoprost  (XALATAN ) 0.005 % ophthalmic solution Place 1 drop into both eyes at bedtime.     nateglinide  (STARLIX ) 60 MG tablet Take 60 mg by mouth daily.     ONETOUCH VERIO test strip USE TO TEST TWICE DAILY AS DIRECTED 200 strip 2   Rivaroxaban  (XARELTO ) 15 MG TABS tablet Take 1 tablet (15 mg total) by mouth daily with supper. 30 tablet 3   timolol  (TIMOPTIC ) 0.5 % ophthalmic solution Place 1 drop into both eyes 2 (two) times daily.     torsemide  (DEMADEX ) 20 MG tablet Take 2 tablets (40 mg total) by mouth daily. 60 tablet 0   No facility-administered medications prior to visit.     Review of Systems:   Constitutional:   No  weight loss, night sweats,  Fevers, chills,+ fatigue, or  lassitude.  HEENT:   No headaches,  Difficulty swallowing,  Tooth/dental problems, or  Sore throat,                No  sneezing, itching, ear ache, nasal congestion, post nasal drip,   CV:  No chest pain,  Orthopnea, PND, swelling in lower extremities, anasarca, dizziness, palpitations, syncope.   GI  No heartburn, indigestion, abdominal pain, nausea, vomiting, diarrhea, change in bowel habits, loss of appetite, bloody stools.   Resp: No shortness of breath with exertion or at rest.  No excess mucus, no productive cough,  No non-productive cough,  No coughing up of blood.  No change in color of mucus.  No wheezing.  No chest wall deformity  Skin: no rash or lesions.  GU: no dysuria, change in color of urine, no urgency or frequency.  No flank pain, no hematuria   MS:  No  joint pain or swelling.  No decreased range of motion.  No back pain.    Physical Exam  BP (!) 148/71 (BP Location: Left Arm, Patient Position: Sitting, Cuff Size: Large)   Pulse (!) 52   Ht 6\' 3"  (1.905 m)   Wt 210 lb 9.6 oz (95.5 kg)   SpO2 100%   BMI 26.32 kg/m   GEN: A/Ox3; pleasant , NAD, elderly    HEENT:  [place]/AT,  EACs-clear, TMs-wnl, NOSE-clear, THROAT-clear, no lesions, no postnasal drip or exudate noted.   NECK:  Supple w/ fair ROM; no JVD; normal carotid impulses w/o bruits; no thyromegaly or nodules palpated; no lymphadenopathy.    RESP  Clear  P & A; w/o, wheezes/ rales/ or rhonchi. no accessory muscle use, no dullness to percussion  CARD:  RRR, no m/r/g, tr peripheral edema, pulses intact, no cyanosis or clubbing.  GI:   Soft & nt; nml bowel sounds; no organomegaly or masses detected.   Musco: Warm bil, no deformities or joint swelling noted.   Neuro: alert, no focal deficits noted.    Skin: Warm, no lesions or rashes    Lab Results:  CBC    Component Value Date/Time   WBC 7.4 11/29/2023 1138   RBC 4.51 11/29/2023 1138   HGB 11.9 (L) 11/29/2023 1138   HGB 11.6 (L) 11/15/2023 1122   HCT 36.1 (L) 11/29/2023 1138   HCT 35.7 (L) 11/15/2023 1122   PLT 305.0 11/29/2023 1138   PLT 312 11/15/2023 1122    MCV 80.1 11/29/2023 1138   MCV 84 11/15/2023 1122   MCH 26.3 11/24/2023 0307   MCHC 32.9 11/29/2023 1138   RDW 15.2 11/29/2023 1138   RDW 15.2 11/15/2023 1122   LYMPHSABS 1.4 11/21/2023 0510   MONOABS 0.7 11/21/2023 0510   EOSABS 0.2 11/21/2023 0510   BASOSABS 0.1 11/21/2023 0510    BMET    Component Value Date/Time   NA 138 11/29/2023 1138   NA 141 11/15/2023 1122   K 3.4 (L) 11/29/2023 1138   CL 101 11/29/2023 1138   CO2 28 11/29/2023 1138   GLUCOSE 224 (H) 11/29/2023 1138   GLUCOSE 251 (H) 06/13/2006 1050   BUN 45 (H) 11/29/2023 1138   BUN 30 (H) 11/15/2023 1122   CREATININE 3.92 (H) 11/29/2023 1138   CREATININE 2.80 (H) 08/07/2020 0853   CREATININE 2.52 (H) 01/15/2020 0811   CALCIUM 9.4 11/29/2023 1138   GFRNONAA 18 (L) 11/24/2023 1246   GFRNONAA 23 (L) 08/07/2020 0853   GFRAA 30 05/24/2019 0000   GFRAA 25 (L) 04/03/2019 1106    BNP    Component Value Date/Time   BNP 595.2 (H) 11/21/2023 0510    ProBNP No results found for: "PROBNP"  Imaging: US  EKG SITE RITE Result Date: 11/22/2023 If Site Rite image not attached, placement could not be confirmed due to current cardiac rhythm.  ECHOCARDIOGRAM COMPLETE Result Date: 11/21/2023    ECHOCARDIOGRAM REPORT   Patient Name:   MAKI SWEETSER Date of Exam: 11/21/2023 Medical Rec #:  [id number]        Height:       72.0 in Accession #:    [id number]       Weight:       227.5 lb Date of Birth:  1946-02-24        BSA:          2.250 m Patient Age:    77 years         BP:  145/95 mmHg Patient Gender: M                HR:           71 bpm. Exam Location:  Inpatient Procedure: 2D Echo, Color Doppler and Cardiac Doppler (Both Spectral and Color            Flow Doppler were utilized during procedure). Indications:    CHF-Acute Diastolic I50.31  History:        Patient has no prior history of Echocardiogram examinations,                 most recent 07/05/2022.  Sonographer:    Hersey Lorenzo RDCS Referring Phys: [phone] RONDELL  A SMITH IMPRESSIONS  1. Left ventricular ejection fraction, by estimation, is 30 to 35%. The left ventricle has moderately decreased function. The left ventricle demonstrates global hypokinesis. The left ventricular internal cavity size was mildly dilated. There is mild concentric left ventricular hypertrophy. Left ventricular diastolic parameters are consistent with Grade III diastolic dysfunction (restrictive). Elevated left atrial pressure.  2. Right ventricular systolic function is normal. The right ventricular size is mildly enlarged. There is mildly elevated pulmonary artery systolic pressure. The estimated right ventricular systolic pressure is 42.0 mmHg.  3. Left atrial size was severely dilated.  4. Right atrial size was severely dilated.  5. A small pericardial effusion is present. The pericardial effusion is circumferential. There is no evidence of cardiac tamponade.  6. The mitral valve is normal in structure. No evidence of mitral valve regurgitation. No evidence of mitral stenosis.  7. The aortic valve is tricuspid. There is mild calcification of the aortic valve. There is mild thickening of the aortic valve. Aortic valve regurgitation is not visualized. Aortic valve sclerosis/calcification is present, without any evidence of aortic stenosis.  8. The inferior vena cava is dilated in size with <50% respiratory variability, suggesting right atrial pressure of 15 mmHg. Comparison(s): Note low QRS complex voltage on ECG. Consider restrictive/infiltrative cardiomyopathy such as cardiac amyloidosis, probably with superimposed tachycardia cardiomyopathy, as well as possible mechanical atrial stunning shortly after cardioversion. FINDINGS  Left Ventricle: Left ventricular ejection fraction, by estimation, is 30 to 35%. The left ventricle has moderately decreased function. The left ventricle demonstrates global hypokinesis. The left ventricular internal cavity size was mildly dilated. There is mild concentric  left ventricular hypertrophy. Left ventricular diastolic parameters are consistent with Grade III diastolic dysfunction (restrictive). Elevated left atrial pressure. Right Ventricle: The right ventricular size is mildly enlarged. No increase in right ventricular wall thickness. Right ventricular systolic function is normal. There is mildly elevated pulmonary artery systolic pressure. The tricuspid regurgitant velocity is 2.60 m/s, and with an assumed right atrial pressure of 15 mmHg, the estimated right ventricular systolic pressure is 42.0 mmHg. Left Atrium: Left atrial size was severely dilated. Right Atrium: Right atrial size was severely dilated. Pericardium: A small pericardial effusion is present. The pericardial effusion is circumferential. There is no evidence of cardiac tamponade. Mitral Valve: The mitral valve is normal in structure. No evidence of mitral valve regurgitation. No evidence of mitral valve stenosis. Tricuspid Valve: The tricuspid valve is normal in structure. Tricuspid valve regurgitation is mild. Aortic Valve: The aortic valve is tricuspid. There is mild calcification of the aortic valve. There is mild thickening of the aortic valve. Aortic valve regurgitation is not visualized. Aortic valve sclerosis/calcification is present, without any evidence of aortic stenosis. Pulmonic Valve: The pulmonic valve was grossly normal. Pulmonic valve regurgitation is not  visualized. No evidence of pulmonic stenosis. Aorta: The aortic root and ascending aorta are structurally normal, with no evidence of dilitation. Venous: The inferior vena cava is dilated in size with less than 50% respiratory variability, suggesting right atrial pressure of 15 mmHg. IAS/Shunts: No atrial level shunt detected by color flow Doppler.  LEFT VENTRICLE PLAX 2D LVIDd:         5.80 cm   Diastology LVIDs:         4.90 cm   LV e' medial:    4.24 cm/s LV PW:         1.30 cm   LV E/e' medial:  23.1 LV IVS:        1.30 cm   LV e'  lateral:   4.57 cm/s LVOT diam:     2.30 cm   LV E/e' lateral: 21.4 LV SV:         85 LV SV Index:   38 LVOT Area:     4.15 cm  RIGHT VENTRICLE             IVC RV S prime:     15.60 cm/s  IVC diam: 2.90 cm TAPSE (M-mode): 2.4 cm LEFT ATRIUM              Index        RIGHT ATRIUM           Index LA diam:        5.30 cm  2.36 cm/m   RA Area:     24.20 cm LA Vol (A2C):   163.0 ml 72.43 ml/m  RA Volume:   77.10 ml  34.26 ml/m LA Vol (A4C):   160.0 ml 71.10 ml/m LA Biplane Vol: 165.0 ml 73.32 ml/m  AORTIC VALVE LVOT Vmax:   97.80 cm/s LVOT Vmean:  70.300 cm/s LVOT VTI:    0.205 m  AORTA Ao Root diam: 3.10 cm Ao Asc diam:  3.40 cm MITRAL VALVE               TRICUSPID VALVE MV Area (PHT): 5.58 cm    TR Peak grad:   27.0 mmHg MV Decel Time: 136 msec    TR Vmax:        260.00 cm/s MV E velocity: 97.90 cm/s MV A velocity: 39.90 cm/s  SHUNTS MV E/A ratio:  2.45        Systemic VTI:  0.20 m                            Systemic Diam: 2.30 cm Karyl Paget Croitoru MD Electronically signed by [name] MD Signature Date/Time: 11/21/2023/3:13:04 PM    Final    DG Chest Port 1 View Result Date: 11/21/2023 CLINICAL DATA:  Shortness of breath. EXAM: PORTABLE CHEST 1 VIEW COMPARISON:  08/09/2023 FINDINGS: Cardiac enlargement. Blunting of the left costophrenic angle. Mild increase interstitial markings. Patchy opacities identified within the left mid and left lower lung. The visualized osseous structures are unremarkable. IMPRESSION: 1. Cardiac enlargement with small left pleural effusion and mild interstitial edema. 2. Patchy opacities within the left mid and left lower lung compatible with pneumonia versus asymmetric edema. Electronically Signed   By: Kimberley Penman M.D.   On: 11/21/2023 05:34   EP STUDY Result Date: 11/20/2023 See surgical note for result.   Administration History     None           No data to display  No results found for: "NITRICOXIDE"      Assessment & Plan:   Mediastinal  adenopathy Mediastinal adenopathy noted on CT chest March 2025 questionable etiology.  Patient has a follow-up CT chest planned later this month.  Will follow-up with Dr. Baldwin Levee to review CT scan results.  If still present consider PET scan.  May need to proceed with tissue sampling with EBus   Plan  Patient Instructions  CT chest as planned this month .  Follow up with Cardiology, Nephrology .  Follow up with Dr. Baldwin Levee  in 6 weeks and As needed  -30 min slot .      Acute on chronic combined systolic and diastolic CHF (congestive heart failure) (HCC) Recent hospitalization for decompensated heart failure.  Echo showed decreased EF at 30 to 35% -improved with hospitalization with cautious diuresis.  Continue follow-up with cardiology and nephrology.  Plan  Patient Instructions  CT chest as planned this month .  Follow up with Cardiology, Nephrology .  Follow up with Dr. Baldwin Levee  in 6 weeks and As needed  -30 min slot .   n   Persistent atrial fibrillation (HCC) Continue on current regimen.  Continue follow-up with cardiology.  CKD (chronic kidney disease), stage IV (HCC) Continue follow-up with nephrology.  Acute respiratory failure with hypoxia (HCC) Recent hospitalization with decompensated heart failure, A-fib and chronic kidney disease.  Did require BiPAP support initially.  Previous sleep study showed no evidence of sleep apnea. Previous CT showed trace interstitial edema -continue to monitor closely.  Patient is on amiodarone .  Has an upcoming CT chest later this month.  O2 saturations are 100% on room air.  Plan  Patient Instructions  CT chest as planned this month .  Follow up with Cardiology, Nephrology .  Follow up with Dr. Baldwin Levee  in 6 weeks and As needed  -30 min slot .        Roena Clark, NP 12/04/2023

## 2023-12-04 NOTE — Assessment & Plan Note (Signed)
 Recent hospitalization with decompensated heart failure, A-fib and chronic kidney disease.  Did require BiPAP support initially.  Previous sleep study showed no evidence of sleep apnea. Previous CT showed trace interstitial edema -continue to monitor closely.  Patient is on amiodarone .  Has an upcoming CT chest later this month.  O2 saturations are 100% on room air.  Plan  Patient Instructions  CT chest as planned this month .  Follow up with Cardiology, Nephrology .  Follow up with Dr. Baldwin Levee  in 6 weeks and As needed  -30 min slot .

## 2023-12-04 NOTE — Assessment & Plan Note (Signed)
 Continue follow-up with nephrology.

## 2023-12-04 NOTE — Assessment & Plan Note (Signed)
 Recent hospitalization for decompensated heart failure.  Echo showed decreased EF at 30 to 35% -improved with hospitalization with cautious diuresis.  Continue follow-up with cardiology and nephrology.  Plan  Patient Instructions  CT chest as planned this month .  Follow up with Cardiology, Nephrology .  Follow up with Dr. Baldwin Levee  in 6 weeks and As needed  -30 min slot .   n

## 2023-12-06 ENCOUNTER — Other Ambulatory Visit: Payer: Self-pay

## 2023-12-06 ENCOUNTER — Ambulatory Visit: Payer: Self-pay | Admitting: Adult Health

## 2023-12-06 ENCOUNTER — Other Ambulatory Visit (INDEPENDENT_AMBULATORY_CARE_PROVIDER_SITE_OTHER)

## 2023-12-06 DIAGNOSIS — N184 Chronic kidney disease, stage 4 (severe): Secondary | ICD-10-CM

## 2023-12-06 DIAGNOSIS — E876 Hypokalemia: Secondary | ICD-10-CM | POA: Diagnosis not present

## 2023-12-06 LAB — BASIC METABOLIC PANEL WITH GFR
BUN: 34 mg/dL — ABNORMAL HIGH (ref 6–23)
CO2: 27 meq/L (ref 19–32)
Calcium: 9.6 mg/dL (ref 8.4–10.5)
Chloride: 105 meq/L (ref 96–112)
Creatinine, Ser: 2.99 mg/dL — ABNORMAL HIGH (ref 0.40–1.50)
GFR: 19.57 mL/min — ABNORMAL LOW (ref >60.00)
Glucose, Bld: 165 mg/dL — ABNORMAL HIGH (ref 70–99)
Potassium: 3.4 meq/L — ABNORMAL LOW (ref 3.5–5.1)
Sodium: 141 meq/L (ref 135–145)

## 2023-12-06 MED ORDER — POTASSIUM CHLORIDE CRYS ER 10 MEQ PO TBCR: 10.0000 meq | EXTENDED_RELEASE_TABLET | Freq: Every day | ORAL | 1 refills | Status: DC

## 2023-12-07 ENCOUNTER — Other Ambulatory Visit: Payer: Self-pay | Admitting: Adult Health

## 2023-12-07 DIAGNOSIS — E119 Type 2 diabetes mellitus without complications: Secondary | ICD-10-CM

## 2023-12-11 ENCOUNTER — Other Ambulatory Visit (HOSPITAL_BASED_OUTPATIENT_CLINIC_OR_DEPARTMENT_OTHER)

## 2023-12-13 ENCOUNTER — Encounter: Payer: Self-pay | Admitting: Physician Assistant

## 2023-12-13 ENCOUNTER — Ambulatory Visit: Attending: Physician Assistant | Admitting: Physician Assistant

## 2023-12-13 VITALS — BP 144/72 | HR 52 | Ht 75.0 in | Wt 207.0 lb

## 2023-12-13 DIAGNOSIS — E785 Hyperlipidemia, unspecified: Secondary | ICD-10-CM | POA: Diagnosis not present

## 2023-12-13 DIAGNOSIS — E119 Type 2 diabetes mellitus without complications: Secondary | ICD-10-CM | POA: Diagnosis not present

## 2023-12-13 DIAGNOSIS — I5022 Chronic systolic (congestive) heart failure: Secondary | ICD-10-CM

## 2023-12-13 DIAGNOSIS — I10 Essential (primary) hypertension: Secondary | ICD-10-CM | POA: Diagnosis not present

## 2023-12-13 DIAGNOSIS — I480 Paroxysmal atrial fibrillation: Secondary | ICD-10-CM | POA: Diagnosis not present

## 2023-12-13 DIAGNOSIS — N184 Chronic kidney disease, stage 4 (severe): Secondary | ICD-10-CM

## 2023-12-13 MED ORDER — TORSEMIDE 20 MG PO TABS: 20.0000 mg | ORAL_TABLET | ORAL | Status: DC

## 2023-12-13 MED ORDER — AMIODARONE HCL 200 MG PO TABS: 200.0000 mg | ORAL_TABLET | Freq: Every day | ORAL | 0 refills | Status: DC

## 2023-12-13 MED ORDER — ISOSORBIDE MONONITRATE ER 30 MG PO TB24: 30.0000 mg | ORAL_TABLET | Freq: Every day | ORAL | 3 refills | Status: AC

## 2023-12-13 NOTE — Progress Notes (Signed)
 Cardiology Office Note   Date:  12/13/2023  ID:  [name] [name], DOB [birth year], MRN [id number] PCP: [name], NP  [facility] Providers Cardiologist:  [name], MD Electrophysiologist:  [name], MD     History of Present Illness Clinten Howk Sanderlin is a 78 y.o. male with a hx of hypertension, hyperlipidemia, DM2, CKD and history of CVA.  Patient had a CVA in 2019 and had a loop recorder placed which did not show any significant arrhythmia.  Echocardiogram and a TEE in 2019 were normal.  Patient did have moderate carotid artery disease being followed by vascular surgery in [place].  When he was seen in late 2023 by Dr. Swaziland, he complained of dyspnea on exertion.  He was under a lot of stress taking care of his daughter who was quite ill at the time.  Subsequent Myoview  obtained on 06/09/2022 demonstrated no evidence of infarction with ischemia, small defect of mild reduction in uptake present in the apical apex location consistent with artifact, EF 47%.  Echocardiogram obtained on 07/05/2022 showed EF 45 to 50%, global hypokinesis, grade 2 DD, severe LAE, trivial MR mildly dilated ascending aorta measurement of 38 mm.  Heart monitor placed in February 2025 by PCP showed 12% A-fib burden.  He was started on Xarelto  and was seen in the A-fib clinic.  Due to underlying CAD, only antiarrhythmic option would be amiodarone .  He was started on amiodarone  therapy.  Patient was seen by Dr. [name] on 11/15/2023 who mentioned he was quite symptomatic while in A-fib.  Various options has been discussed with the patient and eventually decided to proceed with cardioversion with plan for A-fib ablation in August.  He underwent successful cardioversion on 11/20/2023.  Unfortunately, post cardioversion he developed worsening shortness of breath and was taken back to the hospital on the following day.  O2 saturation was in the high 80s by EMS.  He was found to be volume overloaded and was given  IV Lasix .  Echocardiogram obtained on 11/21/2023 showed EF 30 to 35%, global hypokinesis, grade 3 DD RVSP 42 mmHg, severe biatrial enlargement, small pericardial effusion.  During the hospitalization, irbesartan  held due to worsening renal function.  During hospitalization, he had recurrent A-fib however converted back to sinus rhythm prior to discharge.  Amiodarone  was increased to 200 mg twice a day for 1 week then 200 mg daily thereafter.  He was discharged on 40 mg daily of torsemide .  He has been following by pulmonology service due to mediastinal adenopathy seen on previous CT.  They plan to proceed with repeat CT image later this month and if still persistent may consider PET scan.  Patient presents today for follow-up accompanied by wife.  He is feeling well.  He denies any chest pain or shortness of breath.  He has had recurrence of atrial fibrillation, he is awaiting ablation procedure in August.  I decided to stop his amlodipine  and switch it to Imdur 30 mg daily since he is already on hydralazine .  We discussed his recent drop in the ejection fraction.  I will continue him on labetalol  for rate control at this time.  He is not a candidate for SGLT2 inhibitor and ACE inhibitor/ARB due to poor renal function.  If he is still holding sinus rhythm on the next follow-up with Dr. Swaziland in September, we can consider a repeat echocardiogram.  Otherwise, he appears to be euvolemic.  He says after he discussed with his PCP, he has dropped  torsemide  to 20 mg every other day and he has been doing well on this dose.  He has lost some weight.  I asked him to continue to monitor his weight and the contact to see if he is weight increased by more than 3 pounds overnight or 5 pounds in single week.  ROS:   He denies chest pain, dyspnea, pnd, orthopnea, n, v, dizziness, syncope, edema, weight gain, or early satiety. All other systems reviewed and are otherwise negative except as noted above.   He had several  recurrent palpitation since discharge.  Studies Reviewed EKG Interpretation Date/Time:  Wednesday December 13 2023 09:29:25 EDT Ventricular Rate:  65 PR Interval:  208 QRS Duration:  120 QT Interval:  428 QTC Calculation: 445 R Axis:   101  Text Interpretation: Sinus rhythm with marked sinus arrhythmia PACs and PVCs No obvious ST-T wave changes Confirmed by Ervin Heath [phone]) on 12/13/2023 3:21:12 PM    Cardiac Studies & Procedures   ______________________________________________________________________________________________   STRESS TESTS  MYOCARDIAL PERFUSION IMAGING 06/09/2022  Narrative   Findings are consistent with no prior ischemia. The study is intermediate risk.   No ST deviation was noted.   LV perfusion is abnormal. There is no evidence of ischemia. There is no evidence of infarction. Defect 1: There is a small defect with mild reduction in uptake present in the apical apex location(s) that is fixed. There is normal wall motion in the defect area. Consistent with artifact.   Left ventricular function is abnormal. Global function is mildly reduced. End diastolic cavity size is moderately enlarged. End systolic cavity size is moderately enlarged.   Prior study not available for comparison.  Small size, mild intensity fixed apical perfusion defect, c/w apical thinning artifact. No reversible ischemia. LVEF 47% with moderately dilated LV and global hypokinesis. This is an intermediate risk study. No prior study for comparison.   ECHOCARDIOGRAM  ECHOCARDIOGRAM COMPLETE 11/21/2023  Narrative ECHOCARDIOGRAM REPORT    Patient Name:   VELDON WAGER Date of Exam: 11/21/2023 Medical Rec #:  [id number]        Height:       72.0 in Accession #:    [id number]       Weight:       227.5 lb Date of Birth:  06-11-1946        BSA:          2.250 m Patient Age:    77 years         BP:           145/95 mmHg Patient Gender: M                HR:           71 bpm. Exam Location:   Inpatient  Procedure: 2D Echo, Color Doppler and Cardiac Doppler (Both Spectral and Color Flow Doppler were utilized during procedure).  Indications:    CHF-Acute Diastolic I50.31  History:        Patient has no prior history of Echocardiogram examinations, most recent 07/05/2022.  Sonographer:    Hersey Lorenzo RDCS Referring Phys: [phone] RONDELL A SMITH  IMPRESSIONS   1. Left ventricular ejection fraction, by estimation, is 30 to 35%. The left ventricle has moderately decreased function. The left ventricle demonstrates global hypokinesis. The left ventricular internal cavity size was mildly dilated. There is mild concentric left ventricular hypertrophy. Left ventricular diastolic parameters are consistent with Grade III diastolic dysfunction (restrictive). Elevated left atrial pressure. 2.  Right ventricular systolic function is normal. The right ventricular size is mildly enlarged. There is mildly elevated pulmonary artery systolic pressure. The estimated right ventricular systolic pressure is 42.0 mmHg. 3. Left atrial size was severely dilated. 4. Right atrial size was severely dilated. 5. A small pericardial effusion is present. The pericardial effusion is circumferential. There is no evidence of cardiac tamponade. 6. The mitral valve is normal in structure. No evidence of mitral valve regurgitation. No evidence of mitral stenosis. 7. The aortic valve is tricuspid. There is mild calcification of the aortic valve. There is mild thickening of the aortic valve. Aortic valve regurgitation is not visualized. Aortic valve sclerosis/calcification is present, without any evidence of aortic stenosis. 8. The inferior vena cava is dilated in size with <50% respiratory variability, suggesting right atrial pressure of 15 mmHg.  Comparison(s): Note low QRS complex voltage on ECG. Consider restrictive/infiltrative cardiomyopathy such as cardiac amyloidosis, probably with superimposed tachycardia  cardiomyopathy, as well as possible mechanical atrial stunning shortly after cardioversion.  FINDINGS Left Ventricle: Left ventricular ejection fraction, by estimation, is 30 to 35%. The left ventricle has moderately decreased function. The left ventricle demonstrates global hypokinesis. The left ventricular internal cavity size was mildly dilated. There is mild concentric left ventricular hypertrophy. Left ventricular diastolic parameters are consistent with Grade III diastolic dysfunction (restrictive). Elevated left atrial pressure.  Right Ventricle: The right ventricular size is mildly enlarged. No increase in right ventricular wall thickness. Right ventricular systolic function is normal. There is mildly elevated pulmonary artery systolic pressure. The tricuspid regurgitant velocity is 2.60 m/s, and with an assumed right atrial pressure of 15 mmHg, the estimated right ventricular systolic pressure is 42.0 mmHg.  Left Atrium: Left atrial size was severely dilated.  Right Atrium: Right atrial size was severely dilated.  Pericardium: A small pericardial effusion is present. The pericardial effusion is circumferential. There is no evidence of cardiac tamponade.  Mitral Valve: The mitral valve is normal in structure. No evidence of mitral valve regurgitation. No evidence of mitral valve stenosis.  Tricuspid Valve: The tricuspid valve is normal in structure. Tricuspid valve regurgitation is mild.  Aortic Valve: The aortic valve is tricuspid. There is mild calcification of the aortic valve. There is mild thickening of the aortic valve. Aortic valve regurgitation is not visualized. Aortic valve sclerosis/calcification is present, without any evidence of aortic stenosis.  Pulmonic Valve: The pulmonic valve was grossly normal. Pulmonic valve regurgitation is not visualized. No evidence of pulmonic stenosis.  Aorta: The aortic root and ascending aorta are structurally normal, with no evidence of  dilitation.  Venous: The inferior vena cava is dilated in size with less than 50% respiratory variability, suggesting right atrial pressure of 15 mmHg.  IAS/Shunts: No atrial level shunt detected by color flow Doppler.   LEFT VENTRICLE PLAX 2D LVIDd:         5.80 cm   Diastology LVIDs:         4.90 cm   LV e' medial:    4.24 cm/s LV PW:         1.30 cm   LV E/e' medial:  23.1 LV IVS:        1.30 cm   LV e' lateral:   4.57 cm/s LVOT diam:     2.30 cm   LV E/e' lateral: 21.4 LV SV:         85 LV SV Index:   38 LVOT Area:     4.15 cm   RIGHT VENTRICLE  IVC RV S prime:     15.60 cm/s  IVC diam: 2.90 cm TAPSE (M-mode): 2.4 cm  LEFT ATRIUM              Index        RIGHT ATRIUM           Index LA diam:        5.30 cm  2.36 cm/m   RA Area:     24.20 cm LA Vol (A2C):   163.0 ml 72.43 ml/m  RA Volume:   77.10 ml  34.26 ml/m LA Vol (A4C):   160.0 ml 71.10 ml/m LA Biplane Vol: 165.0 ml 73.32 ml/m AORTIC VALVE LVOT Vmax:   97.80 cm/s LVOT Vmean:  70.300 cm/s LVOT VTI:    0.205 m  AORTA Ao Root diam: 3.10 cm Ao Asc diam:  3.40 cm  MITRAL VALVE               TRICUSPID VALVE MV Area (PHT): 5.58 cm    TR Peak grad:   27.0 mmHg MV Decel Time: 136 msec    TR Vmax:        260.00 cm/s MV E velocity: 97.90 cm/s MV A velocity: 39.90 cm/s  SHUNTS MV E/A ratio:  2.45        Systemic VTI:  0.20 m Systemic Diam: 2.30 cm  [name] MD Electronically signed by [name] MD Signature Date/Time: 11/21/2023/3:13:04 PM    Final   TEE  ECHO TEE 07/11/2017  Narrative *[place]* *[facility]* [address] [place], [place] [address] [phone]  ------------------------------------------------------------------- Transesophageal Echocardiography  Patient:    Dravin, Lance MR #:       [id number] Study Date: 07/11/2017 Gender:     M Age:        71 Height:     190.5 cm Weight:     111.4 kg BSA:        2.45 m^2 Pt.  Status: Room:  ATTENDING    Samtani, Jai-Gurmukh [id number] PERFORMING   Christoper Crafts, M.D. Abbie Abbey, Ankit Chirag SONOGRAPHER  Libby Ree, RDCS ORDERING     Mcdaniel, Jill D REFERRING    Drema Genta D  cc:  ------------------------------------------------------------------- LV EF: 55% -   60%  ------------------------------------------------------------------- History:   PMH:  Stroke  Risk factors:  Hypertension. Diabetes mellitus. Dyslipidemia.  ------------------------------------------------------------------- Study Conclusions  - Left ventricle: There was mild concentric hypertrophy. Systolic function was normal. The estimated ejection fraction was in the range of 55% to 60%. Wall motion was normal; there were no regional wall motion abnormalities. - Aorta: There was moderate non-mobile atheroma. - Left atrium: The atrium was dilated. No evidence of thrombus in the atrial cavity or appendage. No evidence of thrombus in the atrial cavity or appendage. The appendage was morphologically a left appendage, multilobulated, and of normal size. Emptying velocity was normal. - Right atrium: No evidence of thrombus in the atrial cavity or appendage. - Atrial septum: No defect or patent foramen ovale was identified. - Tricuspid valve: There was mild regurgitation.  Impressions:  - No cardiac source of emboli was indentified.  ------------------------------------------------------------------- Study data:   Study status:  Routine.  Consent:  The risks, benefits, and alternatives to the procedure were explained to the patient and informed consent was obtained.  Procedure:  Initial setup. The patient was brought to the laboratory. Surface ECG leads were monitored. Sedation. Conscious sedation was administered by cardiology staff. Transesophageal echocardiography.  Topical anesthesia was obtained using viscous lidocaine . A transesophageal probe was inserted by  the attending cardiologist. Image quality was adequate.  Study completion:  The patient tolerated the procedure well. There were no complications.          Diagnostic transesophageal echocardiography.  2D and color Doppler. Birthdate:  Patient birthdate: 1945-12-11.  Age:  Patient is 78 yr old.  Sex:  Gender: male.    BMI: 30.7 kg/m^2.  Blood pressure: 223/109  Patient status:  Inpatient.  Study date:  Study date: 07/11/2017. Study time: 09:17 AM.  Location:  Endoscopy.  -------------------------------------------------------------------  ------------------------------------------------------------------- Left ventricle:  There was mild concentric hypertrophy. Systolic function was normal. The estimated ejection fraction was in the range of 55% to 60%. Wall motion was normal; there were no regional wall motion abnormalities.  ------------------------------------------------------------------- Aortic valve:   Trileaflet; normal thickness, mildly calcified leaflets. Cusp separation was normal.  Doppler:  There was no significant regurgitation.  ------------------------------------------------------------------- Aorta:  There was moderate non-mobile atheroma. There was no evidence for dissection. Aortic root: The aortic root was not dilated. Ascending aorta: The ascending aorta was normal in size. Descending aorta: The descending aorta was normal in size.  ------------------------------------------------------------------- Mitral valve:   Structurally normal valve.   Leaflet separation was normal.  Doppler:  There was trivial regurgitation.  ------------------------------------------------------------------- Left atrium:  The atrium was dilated.  No evidence of thrombus in the atrial cavity or appendage.  No evidence of thrombus in the atrial cavity or appendage. The appendage was morphologically a left appendage, multilobulated, and of normal size. Emptying velocity was  normal.  ------------------------------------------------------------------- Atrial septum:  No defect or patent foramen ovale was identified.  ------------------------------------------------------------------- Right ventricle:  The cavity size was normal. Wall thickness was normal. Systolic function was normal.  ------------------------------------------------------------------- Pulmonic valve:    Structurally normal valve.  ------------------------------------------------------------------- Tricuspid valve:   Structurally normal valve.   Leaflet separation was normal.  Doppler:  There was mild regurgitation.  ------------------------------------------------------------------- Pulmonary artery:   The main pulmonary artery was normal-sized.  ------------------------------------------------------------------- Right atrium:  The atrium was normal in size.  No evidence of thrombus in the atrial cavity or appendage. The appendage was morphologically a right appendage.  ------------------------------------------------------------------- Pericardium:  There was no pericardial effusion.  ------------------------------------------------------------------- Prepared and Electronically Authenticated by  Christoper Crafts, M.D. 2019-01-08T11:19:37  MONITORS  LONG TERM MONITOR (3-14 DAYS) 08/25/2023  Narrative NSR with sinus brady (50/min) and sinus tachy (120/min) ave HR 70/min. PAF (12%) with rates of 79-169/min. No VT. NS SVT, longest 20 beats at 121/min, likely atrial tachycardia Isolated PVC's (8.4%) and PAC's (1%). No prolonged pauses. No symptoms recorded. Patch Wear Time:  4 days and 17 hours (2025-02-10T21:41:07-498 to 2025-02-15T14:52:41-0500)       ______________________________________________________________________________________________      Risk Assessment/Calculations  CHA2DS2-VASc Score = 8   This indicates a 10.8% annual risk of stroke. The patient's  score is based upon: CHF History: 1 HTN History: 1 Diabetes History: 1 Stroke History: 2 Vascular Disease History: 1 Age Score: 2 Gender Score: 0           Physical Exam VS:  BP (!) 144/72   Pulse (!) 52   Ht 6' 3 (1.905 m)   Wt 207 lb (93.9 kg)   SpO2 98%   BMI 25.87 kg/m    Wt Readings from Last 3 Encounters:  12/13/23 207 lb (93.9 kg)  12/04/23 210 lb 9.6 oz (95.5 kg)  12/01/23 212 lb (96.2 kg)    GEN:  Well nourished, well developed in no acute distress NECK: No JVD; No carotid bruits CARDIAC: RRR, no murmurs, rubs, gallops RESPIRATORY:  Clear to auscultation without rales, wheezing or rhonchi  ABDOMEN: Soft, non-tender, non-distended EXTREMITIES:  No edema; No deformity   ASSESSMENT AND PLAN  Paroxysmal atrial fibrillation: Continue to have recurrence despite on amiodarone  therapy.  Pending A-fib ablation in August.  Maintaining sinus rhythm today  Chronic systolic heart failure: Stop amlodipine , add Imdur 30 mg daily.  Consider repeat echocardiogram on the next follow-up.  Patient is euvolemic on exam.  He is to contact cardiology service if his weight increased by more than 3 pounds overnight or 5 pounds in single week.  Recent blood work shows stable renal function.  Hypertension: Blood pressure in the 140s today, however per wife, systolic blood pressure has been ranging in the 120s at home.  Valsartan was stopped during the recent hospitalization due to poor renal function.  Hyperlipidemia: On Zetia .  Intolerant of statins  DM2: Managed by primary care provider         Dispo: Follow-up with Dr. Swaziland he is September  Signed, Ervin Heath, [place]

## 2023-12-13 NOTE — Patient Instructions (Signed)
 Medication Instructions:  STOP AMLODIPINE    START IMDUR 30 MG DAILY *If you need a refill on your cardiac medications before your next appointment, please call your pharmacy*  Lab Work: NO LABS If you have labs (blood work) drawn today and your tests are completely normal, you will receive your results only by: MyChart Message (if you have MyChart) OR A paper copy in the mail If you have any lab test that is abnormal or we need to change your treatment, we will call you to review the results.  Testing/Procedures: NO TESTING  Follow-Up: At [facility], you and your health needs are our priority.  As part of our continuing mission to provide you with exceptional heart care, our providers are all part of one team.  This team includes your primary Cardiologist (physician) and Advanced Practice Providers or APPs (Physician Assistants and Nurse Practitioners) who all work together to provide you with the care you need, when you need it.  Your next appointment:   KEEP FOLLOW UP SEPTEMBER 2025  Provider:   [name], MD

## 2023-12-16 ENCOUNTER — Other Ambulatory Visit: Payer: Self-pay | Admitting: Adult Health

## 2023-12-21 ENCOUNTER — Encounter: Payer: Self-pay | Admitting: Adult Health

## 2023-12-21 ENCOUNTER — Other Ambulatory Visit: Payer: Self-pay | Admitting: Physician Assistant

## 2023-12-21 ENCOUNTER — Ambulatory Visit (INDEPENDENT_AMBULATORY_CARE_PROVIDER_SITE_OTHER): Admitting: Adult Health

## 2023-12-21 VITALS — BP 160/76 | HR 53 | Temp 98.7°F | Ht 75.0 in | Wt 208.0 lb

## 2023-12-21 DIAGNOSIS — N184 Chronic kidney disease, stage 4 (severe): Secondary | ICD-10-CM

## 2023-12-21 DIAGNOSIS — E1122 Type 2 diabetes mellitus with diabetic chronic kidney disease: Secondary | ICD-10-CM | POA: Diagnosis not present

## 2023-12-21 DIAGNOSIS — Z7984 Long term (current) use of oral hypoglycemic drugs: Secondary | ICD-10-CM

## 2023-12-21 DIAGNOSIS — I10 Essential (primary) hypertension: Secondary | ICD-10-CM

## 2023-12-21 DIAGNOSIS — Z794 Long term (current) use of insulin: Secondary | ICD-10-CM | POA: Diagnosis not present

## 2023-12-21 DIAGNOSIS — E119 Type 2 diabetes mellitus without complications: Secondary | ICD-10-CM

## 2023-12-21 LAB — POCT GLYCOSYLATED HEMOGLOBIN (HGB A1C): Hemoglobin A1C: 7.7 % — AB (ref 4.0–5.6)

## 2023-12-21 NOTE — Progress Notes (Signed)
 Subjective:    Patient ID: [name] [name], male    DOB: [birth year], 78 y.o.   MRN: [id number]  HPI  78 year old male who  has a past medical history of Afib (HCC), Anemia, Cataract, CHF (congestive heart failure) (HCC), CKD (chronic kidney disease) stage 4, GFR 15-29 ml/min (HCC), DIABETES MELLITUS, TYPE II (01/02/2007), HYPERLIPIDEMIA (06/18/2007), HYPERTENSION (01/02/2007), RENAL INSUFFICIENCY (02/05/2008), and Stroke (HCC) (2019).  Diabetes mellitus type 2-managed with Starlix  60 mg daily and Lantus  9 units in the morning. His wife reports that there are times when his blood sugar are in the 180 but mostly his blood sugars in the 120 range. He was recently in the hospital and was noted to have elevated blood sugars.  Lab Results  Component Value Date   HGBA1C 7.7 (A) 12/21/2023   HGBA1C 6.9 (A) 09/20/2023   HGBA1C 7.1 (A) 06/20/2023    Hypertension with chronic kidney disease stage IV-managed by nephrology.  He is currently prescribed hydralazine  50 mg 3 times daily, Imdur  30 mg ( switched from Norvasc  5 mg by cardiology ) lolsartan 160 mg daily, and labetalol  200 mg 3 times daily.  He does check his blood pressure at home with readings and they report readings mostly in the 120 -140 systolic but have dropped down to 109 systolic. He has not taken his blood pressure medication this morning.  BP Readings from Last 3 Encounters:  12/21/23 (!) 160/76  12/13/23 (!) 144/72  12/04/23 (!) 148/71    Review of Systems See HPI   Past Medical History:  Diagnosis Date   Afib (HCC)    Anemia    Cataract    CHF (congestive heart failure) (HCC)    CKD (chronic kidney disease) stage 4, GFR 15-29 ml/min (HCC)    DIABETES MELLITUS, TYPE II 01/02/2007   HYPERLIPIDEMIA 06/18/2007   HYPERTENSION 01/02/2007   RENAL INSUFFICIENCY 02/05/2008   Stroke (HCC) 2019    Social History   Socioeconomic History   Marital status: Married    Spouse name: Not on file   Number of children: Not on file    Years of education: Not on file   Highest education level: Not on file  Occupational History   Not on file  Tobacco Use   Smoking status: Never   Smokeless tobacco: Never  Vaping Use   Vaping status: Never Used  Substance and Sexual Activity   Alcohol use: No   Drug use: No   Sexual activity: Not on file  Other Topics Concern   Not on file  Social History Narrative   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 Likes to go to football games.    Social Drivers of Corporate investment banker Strain: Low Risk  (08/25/2023)   Overall Financial Resource Strain (CARDIA)    Difficulty of Paying Living Expenses: Not hard at all  Food Insecurity: No Food Insecurity (12/01/2023)   Hunger Vital Sign    Worried About Running Out of Food in the Last Year: Never true    Ran Out of Food in the Last Year: Never true  Transportation Needs: No Transportation Needs (12/01/2023)   PRAPARE - Administrator, Civil Service (Medical): No    Lack of Transportation (Non-Medical): No  Physical Activity: Inactive (08/25/2023)   Exercise Vital Sign  Days of Exercise per Week: 0 days    Minutes of Exercise per Session: 0 min  Stress: No Stress Concern Present (08/25/2023)   [employer] of Occupational Health - Occupational Stress Questionnaire    Feeling of Stress : Not at all  Social Connections: Socially Integrated (11/21/2023)   Social Connection and Isolation Panel    Frequency of Communication with Friends and Family: More than three times a week    Frequency of Social Gatherings with Friends and Family: More than three times a week    Attends Religious Services: More than 4 times per year    Active Member of [employer] or Organizations: Yes    Attends Banker Meetings: More than 4 times per year    Marital Status: Married  Catering manager Violence: Not At Risk  (11/21/2023)   Humiliation, Afraid, Rape, and Kick questionnaire    Fear of Current or Ex-Partner: No    Emotionally Abused: No    Physically Abused: No    Sexually Abused: No    Past Surgical History:  Procedure Laterality Date   CARDIOVERSION N/A 11/20/2023   Procedure: CARDIOVERSION;  Surgeon: [name], MD;  Location: [facility];  Service: Cardiovascular;  Laterality: N/A;   LOOP RECORDER INSERTION N/A 07/11/2017   Procedure: LOOP RECORDER INSERTION;  Surgeon: [name], MD;  Location: [facility];  Service: Cardiovascular;  Laterality: N/A;   SHOULDER SURGERY     left   TEE WITHOUT CARDIOVERSION N/A 07/11/2017   Procedure: TRANSESOPHAGEAL ECHOCARDIOGRAM (TEE);  Surgeon: [name], MD;  Location: [facility];  Service: Cardiovascular;  Laterality: N/A;   TRANSURETHRAL RESECTION OF PROSTATE     history of retention/hematuria     Family History  Problem Relation Age of Onset   Hypertension Mother    Diabetes Mother    Stroke Father    Stroke Maternal Grandmother    Colon cancer Neg Hx    Esophageal cancer Neg Hx    Rectal cancer Neg Hx    Stomach cancer Neg Hx     Allergies  Allergen Reactions   Evolocumab  Hypertension   5-Alpha Reductase Inhibitors Other (See Comments)    myalgia    Iodinated Contrast Media     Avoid due to chronic kidney disease    Penicillins Hives   Statins     myalgia    Sulfa Antibiotics Hives, Itching and Swelling   Sulfa Drugs Cross Reactors Hives, Itching and Swelling   Sulfamethoxazole-Trimethoprim Hives, Itching and Swelling    Current Outpatient Medications on File Prior to Visit  Medication Sig Dispense Refill   amiodarone  (PACERONE ) 200 MG tablet Take 1 tablet (200 mg total) by mouth daily. 90 tablet 0   B Complex Vitamins (VITAMIN B COMPLEX) TABS Take 1 tablet by mouth every morning.     Blood Glucose Monitoring Suppl (ONETOUCH VERIO) w/Device KIT Use with existing test strips 1 kit kit   calcitRIOL   (ROCALTROL ) 0.25 MCG capsule Take 1 capsule by mouth every other day.     Cholecalciferol  50 MCG (2000 UT) CAPS Take 1 capsule by mouth daily.     ezetimibe  (ZETIA ) 10 MG tablet TAKE 1 TABLET(10 MG) BY MOUTH DAILY 90 tablet 3   ferrous sulfate 325 (65 FE) MG tablet Take 325 mg by mouth 2 (two) times daily with a meal.     hydrALAZINE  (APRESOLINE ) 100 MG tablet Take 100 mg by mouth 3 (three) times daily.     insulin  glargine (  LANTUS  SOLOSTAR) 100 UNIT/ML Solostar Pen ADMINISTER 9 UNITS UNDER THE SKIN TWICE DAILY 15 mL 0   isosorbide  mononitrate (IMDUR ) 30 MG 24 hr tablet Take 1 tablet (30 mg total) by mouth daily. 90 tablet 3   labetalol  (NORMODYNE ) 200 MG tablet TAKE 1 TABLET(200 MG) BY MOUTH THREE TIMES DAILY 270 tablet 3   latanoprost  (XALATAN ) 0.005 % ophthalmic solution Place 1 drop into both eyes at bedtime.     nateglinide  (STARLIX ) 60 MG tablet Take 60 mg by mouth daily.     ONETOUCH VERIO test strip USE TO TEST TWICE DAILY AS DIRECTED 200 strip 2   potassium chloride  (KLOR-CON  M) 10 MEQ tablet Take 1 tablet (10 mEq total) by mouth daily. (Patient taking differently: Take 10 mEq by mouth every other day.) 90 tablet 1   Rivaroxaban  (XARELTO ) 15 MG TABS tablet Take 1 tablet (15 mg total) by mouth daily with supper. 30 tablet 3   timolol  (TIMOPTIC ) 0.5 % ophthalmic solution Place 1 drop into both eyes 2 (two) times daily.     torsemide  (DEMADEX ) 20 MG tablet Take 1 tablet (20 mg total) by mouth every other day.     No current facility-administered medications on file prior to visit.    BP (!) 160/76   Pulse (!) 53   Temp 98.7 F (37.1 C) (Oral)   Ht 6' 3 (1.905 m)   Wt 208 lb (94.3 kg)   SpO2 97%   BMI 26.00 kg/m       Objective:   Physical Exam Vitals and nursing note reviewed.  Constitutional:      Appearance: Normal appearance.   Cardiovascular:     Rate and Rhythm: Normal rate and regular rhythm.     Pulses: Normal pulses.     Heart sounds: Normal heart sounds.   Pulmonary:     Effort: Pulmonary effort is normal.     Breath sounds: Normal breath sounds.   Skin:    General: Skin is warm and dry.   Neurological:     General: No focal deficit present.     Mental Status: He is alert and oriented to person, place, and time.   Psychiatric:        Mood and Affect: Mood normal.        Behavior: Behavior normal.        Thought Content: Thought content normal.        Judgment: Judgment normal.        Assessment & Plan:  1. Type 2 diabetes mellitus with stage 4 chronic kidney disease, with long-term current use of insulin  (HCC) (Primary)  - POC HgB A1c- 7.7 - has increased. I am wondering if the elevation is carry over to when he was in the hospital. Will keep his insulin  doe the same   2. Diabetes mellitus treated with oral medication (HCC) - Continue with Starlix   - Follow up in 3 months  - POC HgB A1c  3. Essential hypertension - Not at goal today but he did not take his medications prior to arrival     [name], NP

## 2023-12-21 NOTE — Patient Instructions (Signed)
 Health Maintenance Due  Topic Date Due   Zoster Vaccines- Shingrix (1 of 2) Never done   DTaP/Tdap/Td (2 - Tdap) 11/14/2019   COVID-19 Vaccine (5 - 2024-25 season) 03/05/2023       08/25/2023    3:11 PM 09/16/2022    4:08 PM 08/17/2022   10:06 AM  Depression screen PHQ 2/9  Decreased Interest 0 0 0  Down, Depressed, Hopeless 0 0 0  PHQ - 2 Score 0 0 0  Altered sleeping  3   Tired, decreased energy  1   Change in appetite  0   Feeling bad or failure about yourself   0   Trouble concentrating  0   Moving slowly or fidgety/restless  0   Suicidal thoughts  0   PHQ-9 Score  4   Difficult doing work/chores  Not difficult at all

## 2023-12-28 DIAGNOSIS — E559 Vitamin D deficiency, unspecified: Secondary | ICD-10-CM | POA: Diagnosis not present

## 2023-12-28 DIAGNOSIS — E1169 Type 2 diabetes mellitus with other specified complication: Secondary | ICD-10-CM | POA: Diagnosis not present

## 2023-12-28 DIAGNOSIS — I509 Heart failure, unspecified: Secondary | ICD-10-CM | POA: Diagnosis not present

## 2023-12-28 DIAGNOSIS — R809 Proteinuria, unspecified: Secondary | ICD-10-CM | POA: Diagnosis not present

## 2023-12-28 DIAGNOSIS — M1000 Idiopathic gout, unspecified site: Secondary | ICD-10-CM | POA: Diagnosis not present

## 2023-12-28 DIAGNOSIS — I10 Essential (primary) hypertension: Secondary | ICD-10-CM | POA: Diagnosis not present

## 2023-12-28 DIAGNOSIS — N184 Chronic kidney disease, stage 4 (severe): Secondary | ICD-10-CM | POA: Diagnosis not present

## 2023-12-28 DIAGNOSIS — D6489 Other specified anemias: Secondary | ICD-10-CM | POA: Diagnosis not present

## 2023-12-28 DIAGNOSIS — R309 Painful micturition, unspecified: Secondary | ICD-10-CM | POA: Diagnosis not present

## 2023-12-28 DIAGNOSIS — E211 Secondary hyperparathyroidism, not elsewhere classified: Secondary | ICD-10-CM | POA: Diagnosis not present

## 2024-01-01 NOTE — Progress Notes (Signed)
Labs faxed to PCP and mailed to patient

## 2024-01-03 ENCOUNTER — Telehealth: Payer: Self-pay | Admitting: Emergency Medicine

## 2024-01-03 DIAGNOSIS — H401122 Primary open-angle glaucoma, left eye, moderate stage: Secondary | ICD-10-CM | POA: Diagnosis not present

## 2024-01-03 DIAGNOSIS — H40021 Open angle with borderline findings, high risk, right eye: Secondary | ICD-10-CM | POA: Diagnosis not present

## 2024-01-03 DIAGNOSIS — H1045 Other chronic allergic conjunctivitis: Secondary | ICD-10-CM | POA: Diagnosis not present

## 2024-01-03 DIAGNOSIS — H353131 Nonexudative age-related macular degeneration, bilateral, early dry stage: Secondary | ICD-10-CM | POA: Diagnosis not present

## 2024-01-03 DIAGNOSIS — H0102B Squamous blepharitis left eye, upper and lower eyelids: Secondary | ICD-10-CM | POA: Diagnosis not present

## 2024-01-03 DIAGNOSIS — H0102A Squamous blepharitis right eye, upper and lower eyelids: Secondary | ICD-10-CM | POA: Diagnosis not present

## 2024-01-03 DIAGNOSIS — H02132 Senile ectropion of right lower eyelid: Secondary | ICD-10-CM | POA: Diagnosis not present

## 2024-01-03 DIAGNOSIS — H34212 Partial retinal artery occlusion, left eye: Secondary | ICD-10-CM | POA: Diagnosis not present

## 2024-01-03 DIAGNOSIS — H35033 Hypertensive retinopathy, bilateral: Secondary | ICD-10-CM | POA: Diagnosis not present

## 2024-01-03 DIAGNOSIS — Z961 Presence of intraocular lens: Secondary | ICD-10-CM | POA: Diagnosis not present

## 2024-01-03 NOTE — Telephone Encounter (Signed)
 PT's wife calling to make appt w/Dr. Shelah, 30 min, tsf of care, per Ms. Parrett. He needs to rev the PT's upcoming CT. No appts avail until Sept. PT also is having a surgery in the same area soon so they are requesting something sooner. Please call PT to advise. TY.

## 2024-01-03 NOTE — Telephone Encounter (Signed)
 Wife calling about setting up a FU appt. Would like to be seen before his upcoming surgery.

## 2024-01-03 NOTE — Telephone Encounter (Signed)
 ATC pt x1 Left detailed vm per DPR. Next avail slot is 9/3 at 4pm to allow 30 min visit. No other sooner appts for 30 min time slots.

## 2024-01-09 ENCOUNTER — Ambulatory Visit (HOSPITAL_BASED_OUTPATIENT_CLINIC_OR_DEPARTMENT_OTHER)
Admission: RE | Admit: 2024-01-09 | Discharge: 2024-01-09 | Disposition: A | Discharge status: Home / Self Care | Source: Ambulatory Visit | Attending: Adult Health | Admitting: Adult Health

## 2024-01-09 DIAGNOSIS — I700 Atherosclerosis of aorta: Secondary | ICD-10-CM | POA: Diagnosis not present

## 2024-01-09 DIAGNOSIS — R590 Localized enlarged lymph nodes: Secondary | ICD-10-CM | POA: Diagnosis not present

## 2024-01-10 NOTE — Telephone Encounter (Signed)
Closing encounter pt was scheduled

## 2024-01-11 ENCOUNTER — Ambulatory Visit: Payer: Self-pay | Admitting: Adult Health

## 2024-01-11 DIAGNOSIS — R590 Localized enlarged lymph nodes: Secondary | ICD-10-CM

## 2024-01-12 ENCOUNTER — Telehealth: Payer: Self-pay

## 2024-01-12 NOTE — Telephone Encounter (Signed)
 Wife was returning call. Please advise ?

## 2024-01-12 NOTE — Telephone Encounter (Signed)
 LM on VM informing pt that we are cancelling his CT Scan on 7/15 due to abnormal labs. His GFR is 19.  I will f/u with Dr. Cindie to see if he will need a TEE same day as Ablation.

## 2024-01-15 ENCOUNTER — Other Ambulatory Visit: Payer: Self-pay | Admitting: Adult Health

## 2024-01-15 NOTE — Progress Notes (Signed)
ATC x1.  LVM to return call. 

## 2024-01-15 NOTE — Telephone Encounter (Signed)
 Returned pt's Wife's call. She understands why his CT was cancelled and has no questions regarding that.   She would like to recommend for him to be admitted the night of his Ablation due to complications he had after his DCCV. I explained to her that she could voice her concerns the day of his procedure but they would not send him home if he was not stable.   After DCCV - after going home he had trouble breathing -  He was uncomfortable, EMS was called, he had developed fluid around his lungs and ended up back in the hospital.   Pulmonary Dr has ordered a PET scan that will be done on 7/25.

## 2024-01-16 ENCOUNTER — Other Ambulatory Visit (HOSPITAL_COMMUNITY): Payer: Self-pay

## 2024-01-16 ENCOUNTER — Ambulatory Visit (HOSPITAL_COMMUNITY)

## 2024-01-16 ENCOUNTER — Encounter: Payer: Self-pay | Admitting: Adult Health

## 2024-01-16 MED ORDER — LANCETS MISC. MISC: 1.0000 | Freq: Three times a day (TID) | 0 refills | Status: AC

## 2024-01-16 MED ORDER — BLOOD GLUCOSE MONITORING SUPPL DEVI: 1.0000 | Freq: Three times a day (TID) | 0 refills | Status: DC

## 2024-01-16 MED ORDER — RIVAROXABAN 15 MG PO TABS: 15.0000 mg | ORAL_TABLET | Freq: Every day | ORAL | 3 refills | Status: DC

## 2024-01-16 MED ORDER — LANCET DEVICE MISC: 1.0000 | Freq: Three times a day (TID) | 0 refills | Status: AC

## 2024-01-16 MED ORDER — BLOOD GLUCOSE TEST VI STRP: 1.0000 | ORAL_STRIP | Freq: Three times a day (TID) | 0 refills | Status: DC

## 2024-01-16 NOTE — Telephone Encounter (Signed)
 Copied from CRM [id number]. Topic: Clinical - Prescription Issue >> Jan 15, 2024  9:48 AM Lavanda D wrote: Reason for CRM: Patient's insurance will not cover the One Touch strips as of 8/3, they will need to switch to the countour plus or the accucheck per insurance preferences. Patient's wife is wondering if Darleene has a recommendation for either.   Rx sent to pharmacy pt spouse notified of updated.

## 2024-01-24 NOTE — Progress Notes (Signed)
 I called and spoke to pt's wife, Lucienne. (DPR) [place] informed of Tammy's note and verbalized understanding. PET scan scheduled for 01-26-24 and pt will keep upcoming appt with our office. NFN

## 2024-01-26 ENCOUNTER — Encounter (HOSPITAL_COMMUNITY)
Admission: RE | Admit: 2024-01-26 | Discharge: 2024-01-26 | Disposition: A | Discharge status: Home / Self Care | Source: Ambulatory Visit | Attending: Adult Health | Admitting: Adult Health

## 2024-01-26 DIAGNOSIS — R918 Other nonspecific abnormal finding of lung field: Secondary | ICD-10-CM | POA: Insufficient documentation

## 2024-01-26 DIAGNOSIS — N281 Cyst of kidney, acquired: Secondary | ICD-10-CM | POA: Diagnosis not present

## 2024-01-26 DIAGNOSIS — R590 Localized enlarged lymph nodes: Secondary | ICD-10-CM | POA: Diagnosis not present

## 2024-01-26 DIAGNOSIS — I3139 Other pericardial effusion (noninflammatory): Secondary | ICD-10-CM | POA: Diagnosis not present

## 2024-01-26 LAB — GLUCOSE, CAPILLARY: Glucose-Capillary: 171 mg/dL — ABNORMAL HIGH (ref 70–99)

## 2024-01-26 MED ORDER — FLUDEOXYGLUCOSE F - 18 (FDG) INJECTION
10.0000 | Freq: Once | INTRAVENOUS | Status: AC
  Administered 2024-01-26: 10.37 via INTRAVENOUS

## 2024-01-30 ENCOUNTER — Telehealth (HOSPITAL_COMMUNITY): Payer: Self-pay

## 2024-01-30 ENCOUNTER — Other Ambulatory Visit (HOSPITAL_COMMUNITY): Payer: Self-pay | Admitting: Internal Medicine

## 2024-01-30 DIAGNOSIS — I4819 Other persistent atrial fibrillation: Secondary | ICD-10-CM

## 2024-01-30 NOTE — Telephone Encounter (Signed)
 Prescription refill request for Xarelto  received.  Indication:afib Last office visit:6/25 Weight:94.3  kg Age 78 Scr:3.27  6/25 CrCl:25.23  ml/min  Prescription refilled

## 2024-01-30 NOTE — Telephone Encounter (Signed)
 Patient had a PET scan on 7/25 as recommended after CT Chest by [facility]. Received communication regarding PET results per Madelin Stank, NP, from pulmonary standpoint he does not have sleep apnea. This enlarged lymph node will be addresssed at f/u with [facility] -would not prohibit ablation from pulmonary standpoint. Patient will discuss results w/Dr. Shelah on 9/3.     Spoke with patient's wife Lucienne to discuss upcoming procedure/DPR on file.   CT: not completed due to creatinine level Labs: new orders placed/released   Any recent signs of acute illness or been started on antibiotics? No Any new medications started? No Any medications to hold?  On 8/4, the morning before your procedure - you may take your usual dose (pt only takes Lantus  in the morning). On 8/5, the day of your procedure - ONLY take 1/2 of your usual morning dose.  Any missed doses of blood thinner? No  Advised patient to continue taking Anticoagulant: Xarelto   daily without missing any doses.  Medication instructions:  On the morning of your procedure DO NOT take any medication., including Xarelto  or the procedure may be rescheduled. Nothing to eat or drink after midnight prior to your procedure.  Confirmed patient is scheduled for Atrial Fibrillation Ablation on Tuesday, August 5 with Dr. Ole Holts. Instructed patient to arrive at the Main Entrance A at [facility]: [address] [place], [place] [address] and check in at Admitting at 5:30 AM.  Advised of plan to go home the same day and will only stay overnight if medically necessary. You MUST have a responsible adult to drive you home and MUST be with you the first 24 hours after you arrive home or your procedure could be cancelled.  Patient verbalized understanding to all instructions provided and agreed to proceed with procedure.

## 2024-01-31 ENCOUNTER — Other Ambulatory Visit: Payer: Self-pay | Admitting: *Deleted

## 2024-01-31 DIAGNOSIS — I4819 Other persistent atrial fibrillation: Secondary | ICD-10-CM

## 2024-02-01 ENCOUNTER — Ambulatory Visit: Payer: Self-pay

## 2024-02-01 ENCOUNTER — Ambulatory Visit: Payer: Self-pay | Admitting: Adult Health

## 2024-02-01 LAB — CBC
Hematocrit: 37.8 % (ref 37.5–51.0)
Hemoglobin: 12 g/dL — ABNORMAL LOW (ref 13.0–17.7)
MCH: 27.5 pg (ref 26.6–33.0)
MCHC: 31.7 g/dL (ref 31.5–35.7)
MCV: 87 fL (ref 79–97)
Platelets: 246 x10E3/uL (ref 150–450)
RBC: 4.37 x10E6/uL (ref 4.14–5.80)
RDW: 17.1 % — ABNORMAL HIGH (ref 11.6–15.4)
WBC: 8.2 x10E3/uL (ref 3.4–10.8)

## 2024-02-02 LAB — BASIC METABOLIC PANEL WITH GFR
BUN/Creatinine Ratio: 12 (ref 10–24)
BUN: 37 mg/dL — ABNORMAL HIGH (ref 8–27)
CO2: 18 mmol/L — ABNORMAL LOW (ref 20–29)
Calcium: 9.5 mg/dL (ref 8.6–10.2)
Chloride: 105 mmol/L (ref 96–106)
Creatinine, Ser: 3.07 mg/dL — ABNORMAL HIGH (ref 0.76–1.27)
Glucose: 214 mg/dL — ABNORMAL HIGH (ref 70–99)
Potassium: 3.7 mmol/L (ref 3.5–5.2)
Sodium: 141 mmol/L (ref 134–144)
eGFR: 20 mL/min/1.73 — ABNORMAL LOW (ref >59)

## 2024-02-02 NOTE — Progress Notes (Signed)
 Called and spoke patient/spouse, provided results/recommendations per Madelin Stank NP.  They verbally confirmed understanding.  He has a f/u with Dr. Shelah on 9/3.  Patient has upcoming surgery on 02/06/24 and will let the office know if they need to reschedule.

## 2024-02-05 NOTE — Pre-Procedure Instructions (Signed)
 Instructed patient on the following items: Arrival time 0515 Nothing to eat or drink after midnight No meds AM of procedure Responsible person to drive you home and stay with you for 24 hrs  Have you missed any doses of anti-coagulant Xarelto - takes once a day, hasn't missed missed any doses.

## 2024-02-05 NOTE — Telephone Encounter (Signed)
 Message received from April [name], patient does not need a TEE per Dr. Cindie.

## 2024-02-06 ENCOUNTER — Other Ambulatory Visit: Payer: Self-pay

## 2024-02-06 ENCOUNTER — Ambulatory Visit (HOSPITAL_COMMUNITY)
Admission: RE | Admit: 2024-02-06 | Discharge: 2024-02-06 | Disposition: A | Discharge status: Home / Self Care | Attending: Cardiology | Admitting: Cardiology

## 2024-02-06 ENCOUNTER — Encounter (HOSPITAL_COMMUNITY)
Admission: RE | Disposition: A | Discharge status: Home / Self Care | Payer: Self-pay | Source: Home / Self Care | Attending: Cardiology

## 2024-02-06 ENCOUNTER — Encounter (HOSPITAL_COMMUNITY): Payer: Self-pay | Admitting: Registered Nurse

## 2024-02-06 DIAGNOSIS — Z7901 Long term (current) use of anticoagulants: Secondary | ICD-10-CM | POA: Insufficient documentation

## 2024-02-06 DIAGNOSIS — I483 Typical atrial flutter: Secondary | ICD-10-CM | POA: Insufficient documentation

## 2024-02-06 DIAGNOSIS — Z539 Procedure and treatment not carried out, unspecified reason: Secondary | ICD-10-CM | POA: Insufficient documentation

## 2024-02-06 DIAGNOSIS — Z8673 Personal history of transient ischemic attack (TIA), and cerebral infarction without residual deficits: Secondary | ICD-10-CM | POA: Diagnosis not present

## 2024-02-06 DIAGNOSIS — I4891 Unspecified atrial fibrillation: Secondary | ICD-10-CM | POA: Diagnosis present

## 2024-02-06 DIAGNOSIS — E785 Hyperlipidemia, unspecified: Secondary | ICD-10-CM | POA: Diagnosis not present

## 2024-02-06 DIAGNOSIS — I129 Hypertensive chronic kidney disease with stage 1 through stage 4 chronic kidney disease, or unspecified chronic kidney disease: Secondary | ICD-10-CM | POA: Diagnosis not present

## 2024-02-06 DIAGNOSIS — I4819 Other persistent atrial fibrillation: Secondary | ICD-10-CM | POA: Diagnosis not present

## 2024-02-06 DIAGNOSIS — N189 Chronic kidney disease, unspecified: Secondary | ICD-10-CM | POA: Insufficient documentation

## 2024-02-06 DIAGNOSIS — E1122 Type 2 diabetes mellitus with diabetic chronic kidney disease: Secondary | ICD-10-CM | POA: Insufficient documentation

## 2024-02-06 LAB — GLUCOSE, CAPILLARY: Glucose-Capillary: 189 mg/dL — ABNORMAL HIGH (ref 70–99)

## 2024-02-06 SURGERY — ATRIAL FIBRILLATION ABLATION
Anesthesia: General

## 2024-02-06 MED ORDER — SODIUM CHLORIDE 0.9 % IV SOLN: INTRAVENOUS | Status: DC

## 2024-02-06 MED ORDER — AMIODARONE HCL 200 MG PO TABS: ORAL_TABLET | ORAL | 0 refills | Status: DC

## 2024-02-06 NOTE — H&P (Addendum)
 Electrophysiology Office Note:     Date:  02/06/2024    ID:  [name] [name], DOB [birth year], MRN [id number]   [facility] Cardiologist:  [name], MD  [facility] Electrophysiologist:  OLE [name], MD    Referring MD: Merna Huxley, NP    Chief Complaint: Atrial fibrillation   History of Present Illness:     [name] [name] is a 78 year old man who I am seeing today for an evaluation of atrial fibrillation at the request of Dr. Swaziland.  He has a history of diabetes, hypertension, hyperlipidemia, stroke and CKD.  He had a loop recorder implanted at the time of his stroke in 2019. A heart monitor ordered by a PCP late in 2024 showed AF burden of 12% with symptoms. On xarelto  now for stroke ppx.    [name] [name] is quite symptomatic with his atrial fibrillation.  He experiences fatigue and an internal anxiety feeling/uneasiness.  He takes his Xarelto  without missed doses.  Presents for AF ablation. Procedure reviewed.     Objective Their past medical, social and family history was reviewed.     ROS:   Please see the history of present illness.    All other systems reviewed and are negative.   EKGs/Labs/Other Studies Reviewed:     The following studies were reviewed today:   08/26/2023 zio AF burden 12% Frequent PVC, 8.4% No pauses   07/05/2022 Echo EF 45 RV normal LA severely dilated   05/21/2007 ECG shows sinus rhythm   07/08/2017 ECG shows sinus rhythm   11/06/2023 ECG shows AFL            Physical Exam:     VS:  BP 110/72   Pulse (!) 109   Ht 6' (1.829 m)   Wt 227 lb 8 oz (103.2 kg)   SpO2 97%   BMI 30.85 kg/m         Wt Readings from Last 3 Encounters:  11/15/23 227 lb 8 oz (103.2 kg)  11/06/23 225 lb (102.1 kg)  10/05/23 224 lb (101.6 kg)      GEN: no distress CARD: Regular rhythm, tachycardic, No MRG RESP: No IWOB. CTAB.         Assessment ASSESSMENT AND PLAN:     1. Primary hypertension   2. Typical atrial flutter (HCC)   3.  Persistent atrial fibrillation (HCC)       #AF Symptomatic. Burden 12% on recent monitor. On xarelto  for stroke ppx.   I discussed treatment options for the patient's atrial fibrillation flutter during his clinic appointment occluding conservative management, antiarrhythmic drugs and catheter ablation.  I discussed the procedural details including the risks, recovery and likelihood of success.   Discussed treatment options today for AF including antiarrhythmic drug therapy and ablation. Discussed risks, recovery and likelihood of success with each treatment strategy. Risk, benefits, and alternatives to EP study and ablation for afib were discussed. These risks include but are not limited to stroke, bleeding, vascular damage, tamponade, perforation, damage to the esophagus, lungs, phrenic nerve and other structures, pulmonary vein stenosis, worsening renal function, coronary vasospasm and death.  Discussed potential need for repeat ablation procedures and antiarrhythmic drugs after an initial ablation. The patient understands these risk and wishes to proceed.  We will therefore proceed with catheter ablation at the next available time.  Carto, ICE, anesthesia are requested for the procedure.  Will also obtain CT PV protocol prior to the procedure to exclude LAA thrombus and further evaluate atrial anatomy.     #  Hypertension At goal today.  Recommend checking blood pressures 1-2 times per week at home and recording the values.  Recommend bringing these recordings to the primary care physician.       Presents for AF ablation. Procedure reviewed.     Signed, Ole DASEN. Cindie, MD, [facility], [facility] 02/06/2024 Electrophysiology [facility]  --------------------  ADDENDUM Patient with significantly elevated BP. Manual check is 196/94.  Patient also reports home BP readings that are significantly elevated. Plan to cancel ablation today. Increase amiodarone  to BID for 7 days,  then back down to once daily. Plan for 3 mo follow up to discuss BP trends, kidney function, arrhythmia burden.  Ole T. Cindie, MD, [facility], [facility] Cardiac Electrophysiology

## 2024-02-06 NOTE — Anesthesia Preprocedure Evaluation (Signed)
 Anesthesia Evaluation  Patient identified by MRN, date of birth, ID band Patient awake    Reviewed: Allergy & Precautions, NPO status , Patient's Chart, lab work & pertinent test results, reviewed documented beta blocker date and time   History of Anesthesia Complications Negative for: history of anesthetic complications  Airway        Dental   Pulmonary shortness of breath          Cardiovascular hypertension, +CHF  + dysrhythmias Atrial Fibrillation   IMPRESSIONS     1. Left ventricular ejection fraction, by estimation, is 30 to 35%. The  left ventricle has moderately decreased function. The left ventricle  demonstrates global hypokinesis. The left ventricular internal cavity size  was mildly dilated. There is mild  concentric left ventricular hypertrophy. Left ventricular diastolic  parameters are consistent with Grade III diastolic dysfunction  (restrictive). Elevated left atrial pressure.   2. Right ventricular systolic function is normal. The right ventricular  size is mildly enlarged. There is mildly elevated pulmonary artery  systolic pressure. The estimated right ventricular systolic pressure is  42.0 mmHg.   3. Left atrial size was severely dilated.   4. Right atrial size was severely dilated.   5. A small pericardial effusion is present. The pericardial effusion is  circumferential. There is no evidence of cardiac tamponade.   6. The mitral valve is normal in structure. No evidence of mitral valve  regurgitation. No evidence of mitral stenosis.   7. The aortic valve is tricuspid. There is mild calcification of the  aortic valve. There is mild thickening of the aortic valve. Aortic valve  regurgitation is not visualized. Aortic valve sclerosis/calcification is  present, without any evidence of  aortic stenosis.   8. The inferior vena cava is dilated in size with <50% respiratory  variability, suggesting right  atrial pressure of 15 mmHg.      Neuro/Psych  Neuromuscular disease CVA    GI/Hepatic   Endo/Other  diabetes, Type 2    Renal/GU CRFRenal disease     Musculoskeletal   Abdominal   Peds  Hematology  (+) Blood dyscrasia, anemia   Anesthesia Other Findings   Reproductive/Obstetrics                              Anesthesia Physical Anesthesia Plan Anesthesia Quick Evaluation

## 2024-02-07 ENCOUNTER — Encounter: Payer: Self-pay | Admitting: Adult Health

## 2024-02-07 ENCOUNTER — Ambulatory Visit (INDEPENDENT_AMBULATORY_CARE_PROVIDER_SITE_OTHER): Admitting: Adult Health

## 2024-02-07 VITALS — BP 190/98 | HR 53 | Temp 98.3°F | Wt 211.4 lb

## 2024-02-07 DIAGNOSIS — I10 Essential (primary) hypertension: Secondary | ICD-10-CM | POA: Diagnosis not present

## 2024-02-07 DIAGNOSIS — R051 Acute cough: Secondary | ICD-10-CM

## 2024-02-07 DIAGNOSIS — N184 Chronic kidney disease, stage 4 (severe): Secondary | ICD-10-CM | POA: Diagnosis not present

## 2024-02-07 NOTE — Progress Notes (Signed)
 Subjective:    Patient ID: [name] [name], male    DOB: 1945-10-14, 78 y.o.   MRN: [id number]  Hypertension   78 year old male who  has a past medical history of Afib (HCC), Anemia, Cataract, CHF (congestive heart failure) (HCC), CKD (chronic kidney disease) stage 4, GFR 15-29 ml/min (HCC), DIABETES MELLITUS, TYPE II (01/02/2007), HYPERLIPIDEMIA (06/18/2007), HYPERTENSION (01/02/2007), RENAL INSUFFICIENCY (02/05/2008), and Stroke (HCC) (2019).  He presents to the office today for follow up regarding Hypertension. He does have a history of CKD stage IV and is managed by nephrology. His most recent labs from 01/31/2024 showed a BUN 37, Ser Cr 3.07 and GFR 20    His BP is currently managed with Hydralazine  100 mg TID, labetalol  200 mg TID and torsemide  20 mg every other day and Imdur  30 mg once daily. Valsartan was discontinued at a hospital admission  in May 2025.  Today he reports that he went for his cardiac ablation yesterday and this could not be be performed due to elevated blood pressure in the 190/90's. His wie reports that his blood pressures have been elevated into the 170's over the last week.   Today in the office his BP is 190/88   He also reports a mild dry cough and that been present for a short time. He has not had any SOB or wheezing    Review of Systems See HPI   Past Medical History:  Diagnosis Date   Afib (HCC)    Anemia    Cataract    CHF (congestive heart failure) (HCC)    CKD (chronic kidney disease) stage 4, GFR 15-29 ml/min (HCC)    DIABETES MELLITUS, TYPE II 01/02/2007   HYPERLIPIDEMIA 06/18/2007   HYPERTENSION 01/02/2007   RENAL INSUFFICIENCY 02/05/2008   Stroke (HCC) 2019    Social History   Socioeconomic History   Marital status: Married    Spouse name: Not on file   Number of children: Not on file   Years of education: Not on file   Highest education level: Not on file  Occupational History   Not on file  Tobacco Use   Smoking status:  Never   Smokeless tobacco: Never  Vaping Use   Vaping status: Never Used  Substance and Sexual Activity   Alcohol use: No   Drug use: No   Sexual activity: Not on file  Other Topics Concern   Not on file  Social History Narrative   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 Likes to go to football games.    Social Drivers of Corporate investment banker Strain: Low Risk  (08/25/2023)   Overall Financial Resource Strain (CARDIA)    Difficulty of Paying Living Expenses: Not hard at all  Food Insecurity: No Food Insecurity (12/01/2023)   Hunger Vital Sign    Worried About Running Out of Food in the Last Year: Never true    Ran Out of Food in the Last Year: Never true  Transportation Needs: No Transportation Needs (12/01/2023)   PRAPARE - Administrator, Civil Service (Medical): No    Lack of Transportation (Non-Medical): No  Physical Activity: Inactive (08/25/2023)   Exercise Vital Sign    Days of Exercise per Week: 0 days    Minutes of Exercise per Session: 0 min  Stress:  No Stress Concern Present (08/25/2023)   [employer] of Occupational Health - Occupational Stress Questionnaire    Feeling of Stress : Not at all  Social Connections: Socially Integrated (11/21/2023)   Social Connection and Isolation Panel    Frequency of Communication with Friends and Family: More than three times a week    Frequency of Social Gatherings with Friends and Family: More than three times a week    Attends Religious Services: More than 4 times per year    Active Member of [employer] or Organizations: Yes    Attends Banker Meetings: More than 4 times per year    Marital Status: Married  Catering manager Violence: Not At Risk (11/21/2023)   Humiliation, Afraid, Rape, and Kick questionnaire    Fear of Current or Ex-Partner: No    Emotionally Abused: No    Physically  Abused: No    Sexually Abused: No    Past Surgical History:  Procedure Laterality Date   CARDIOVERSION N/A 11/20/2023   Procedure: CARDIOVERSION;  Surgeon: Francyne Headland, MD;  Location: [facility];  Service: Cardiovascular;  Laterality: N/A;   LOOP RECORDER INSERTION N/A 07/11/2017   Procedure: LOOP RECORDER INSERTION;  Surgeon: Kelsie Agent, MD;  Location: [facility];  Service: Cardiovascular;  Laterality: N/A;   SHOULDER SURGERY     left   TEE WITHOUT CARDIOVERSION N/A 07/11/2017   Procedure: TRANSESOPHAGEAL ECHOCARDIOGRAM (TEE);  Surgeon: Maranda Leim DEL, MD;  Location: [facility] ENDOS[facility];  Service: Cardiovascular;  Laterality: N/A;   TRANSURETHRAL RESECTION OF PROSTATE     history of retention/hematuria     Family History  Problem Relation Age of Onset   Hypertension Mother    Diabetes Mother    Stroke Father    Stroke Maternal Grandmother    Colon cancer Neg Hx    Esophageal cancer Neg Hx    Rectal cancer Neg Hx    Stomach cancer Neg Hx     Allergies  Allergen Reactions   Evolocumab  Hypertension   5-Alpha Reductase Inhibitors Other (See Comments)    myalgia    Iodinated Contrast Media     Avoid due to chronic kidney disease    Penicillins Hives   Statins     myalgia    Sulfa Antibiotics Hives, Itching and Swelling   Sulfa Drugs Cross Reactors Hives, Itching and Swelling   Sulfamethoxazole-Trimethoprim Hives, Itching and Swelling    Current Outpatient Medications on File Prior to Visit  Medication Sig Dispense Refill   amiodarone  (PACERONE ) 200 MG tablet Take 1 tablet (200 mg total) by mouth 2 (two) times daily for 7 days, THEN 1 tablet (200 mg total) daily. 104 tablet 0   B Complex Vitamins (VITAMIN B COMPLEX) TABS Take 1 tablet by mouth every morning.     Blood Glucose Monitoring Suppl DEVI 1 each by Does not apply route in the morning, at noon, and at bedtime. Use to check blood sugar TID May substitute to any manufacturer covered by patient's  insurance. 1 each 0   calcitRIOL  (ROCALTROL ) 0.25 MCG capsule Take 0.25 mcg by mouth every other day.     Cholecalciferol  50 MCG (2000 UT) CAPS Take 2,000 Units by mouth daily.     ezetimibe  (ZETIA ) 10 MG tablet TAKE 1 TABLET(10 MG) BY MOUTH DAILY 90 tablet 3   ferrous sulfate 325 (65 FE) MG tablet Take 325 mg by mouth 2 (two) times daily with a meal.     Glucose Blood (BLOOD GLUCOSE  TEST STRIPS) STRP 1 each by In Vitro route in the morning, at noon, and at bedtime. Use to check blood glucose TID May substitute to any manufacturer covered by patient's insurance. 100 strip 0   hydrALAZINE  (APRESOLINE ) 100 MG tablet Take 100 mg by mouth 3 (three) times daily.     insulin  glargine (LANTUS  SOLOSTAR) 100 UNIT/ML Solostar Pen ADMINISTER 9 UNITS UNDER THE SKIN TWICE DAILY 15 mL 0   isosorbide  mononitrate (IMDUR ) 30 MG 24 hr tablet Take 1 tablet (30 mg total) by mouth daily. 90 tablet 3   labetalol  (NORMODYNE ) 200 MG tablet TAKE 1 TABLET(200 MG) BY MOUTH THREE TIMES DAILY 270 tablet 3   Lancet Device MISC 1 each by Does not apply route in the morning, at noon, and at bedtime. Use to check blood glucose TID May substitute to any manufacturer covered by patient's insurance. 1 each 0   Lancets Misc. MISC 1 each by Does not apply route in the morning, at noon, and at bedtime. Use to check blood glucose TID May substitute to any manufacturer covered by patient's insurance. 100 each 0   latanoprost  (XALATAN ) 0.005 % ophthalmic solution Place 1 drop into both eyes at bedtime.     nateglinide  (STARLIX ) 60 MG tablet Take 60 mg by mouth daily.     potassium chloride  (KLOR-CON  M) 10 MEQ tablet Take 1 tablet (10 mEq total) by mouth daily. 90 tablet 1   Rivaroxaban  (XARELTO ) 15 MG TABS tablet TAKE 1 TABLET(15 MG) BY MOUTH DAILY WITH SUPPER 30 tablet 5   timolol  (TIMOPTIC ) 0.5 % ophthalmic solution Place 1 drop into both eyes 2 (two) times daily.     torsemide  (DEMADEX ) 20 MG tablet TAKE 2 TABLETS BY MOUTH DAILY 180  tablet 3   No current facility-administered medications on file prior to visit.    BP (!) 190/98 (BP Location: Left Arm, Patient Position: Sitting, Cuff Size: Normal)   Pulse (!) 53   Temp 98.3 F (36.8 C) (Oral)   Wt 211 lb 6.4 oz (95.9 kg)   SpO2 99%   BMI 26.42 kg/m       Objective:   Physical Exam Vitals and nursing note reviewed.  Cardiovascular:     Rate and Rhythm: Normal rate and regular rhythm.     Pulses: Normal pulses.     Heart sounds: Normal heart sounds.  Pulmonary:     Effort: Pulmonary effort is normal.     Breath sounds: Normal breath sounds.  Musculoskeletal:        General: Normal range of motion.     Right lower leg: No edema.     Left lower leg: No edema.  Skin:    General: Skin is warm and dry.  Neurological:     General: No focal deficit present.     Mental Status: He is alert and oriented to person, place, and time.  Psychiatric:        Mood and Affect: Mood normal.        Behavior: Behavior normal.        Thought Content: Thought content normal.        Judgment: Judgment normal.        Assessment & Plan:  1. Essential hypertension (Primary) - elevated in the office. I will have them restart his home dose of Valsartan, although not ideal but will bring his blood pressure down until he can be seen by Nephrology, which they plan to follow up with   2. CKD (  chronic kidney disease) stage 4, GFR 15-29 ml/min (HCC)   3. Acute cough - Lungs clear throughout.  - No signs of fluid overload.  - Can use OTC measures   Darleene Shape, NP  I personally spent a total of 38 minutes in the care of the patient today including preparing to see the patient, getting/reviewing separately obtained history, performing a medically appropriate exam/evaluation, counseling and educating, documenting clinical information in the EHR, and communicating results.

## 2024-02-12 ENCOUNTER — Encounter: Payer: Self-pay | Admitting: Emergency Medicine

## 2024-02-13 DIAGNOSIS — E1169 Type 2 diabetes mellitus with other specified complication: Secondary | ICD-10-CM | POA: Diagnosis not present

## 2024-02-13 DIAGNOSIS — I10 Essential (primary) hypertension: Secondary | ICD-10-CM | POA: Diagnosis not present

## 2024-02-13 DIAGNOSIS — R309 Painful micturition, unspecified: Secondary | ICD-10-CM | POA: Diagnosis not present

## 2024-02-13 DIAGNOSIS — I509 Heart failure, unspecified: Secondary | ICD-10-CM | POA: Diagnosis not present

## 2024-02-13 DIAGNOSIS — R809 Proteinuria, unspecified: Secondary | ICD-10-CM | POA: Diagnosis not present

## 2024-02-13 DIAGNOSIS — N184 Chronic kidney disease, stage 4 (severe): Secondary | ICD-10-CM | POA: Diagnosis not present

## 2024-02-13 DIAGNOSIS — D649 Anemia, unspecified: Secondary | ICD-10-CM | POA: Diagnosis not present

## 2024-02-13 DIAGNOSIS — M1000 Idiopathic gout, unspecified site: Secondary | ICD-10-CM | POA: Diagnosis not present

## 2024-02-13 DIAGNOSIS — N189 Chronic kidney disease, unspecified: Secondary | ICD-10-CM | POA: Diagnosis not present

## 2024-02-20 ENCOUNTER — Other Ambulatory Visit: Payer: Self-pay | Admitting: Adult Health

## 2024-02-21 DIAGNOSIS — N189 Chronic kidney disease, unspecified: Secondary | ICD-10-CM | POA: Diagnosis not present

## 2024-02-21 DIAGNOSIS — M1000 Idiopathic gout, unspecified site: Secondary | ICD-10-CM | POA: Diagnosis not present

## 2024-02-21 DIAGNOSIS — I509 Heart failure, unspecified: Secondary | ICD-10-CM | POA: Diagnosis not present

## 2024-02-21 DIAGNOSIS — E1169 Type 2 diabetes mellitus with other specified complication: Secondary | ICD-10-CM | POA: Diagnosis not present

## 2024-02-21 DIAGNOSIS — N184 Chronic kidney disease, stage 4 (severe): Secondary | ICD-10-CM | POA: Diagnosis not present

## 2024-02-21 DIAGNOSIS — E211 Secondary hyperparathyroidism, not elsewhere classified: Secondary | ICD-10-CM | POA: Diagnosis not present

## 2024-02-21 DIAGNOSIS — E559 Vitamin D deficiency, unspecified: Secondary | ICD-10-CM | POA: Diagnosis not present

## 2024-02-21 DIAGNOSIS — I10 Essential (primary) hypertension: Secondary | ICD-10-CM | POA: Diagnosis not present

## 2024-02-21 DIAGNOSIS — D649 Anemia, unspecified: Secondary | ICD-10-CM | POA: Diagnosis not present

## 2024-03-03 NOTE — Progress Notes (Unsigned)
 Cardiology Office Note   Date:  03/03/2024  ID:  JODECI ROARTY, DOB July 15, 1945, MRN [id number] PCP: Merna Huxley, NP  [facility] Providers Cardiologist:  Abena Erdman [place], MD Electrophysiologist:  OLE [name], MD     History of Present Illness [name] [name] is a 78 y.o. male with a hx of hypertension, hyperlipidemia, DM2, CKD and history of CVA. Seen for follow up CHF and Afib.  Patient had a CVA in 2019 and had a loop recorder placed which did not show any significant arrhythmia.  Echocardiogram and a TEE in 2019 were normal.  Patient did have moderate carotid artery disease being followed by vascular surgery in [place].  When he was seen in late 2023 he complained of dyspnea on exertion.  He was under a lot of stress taking care of his daughter who was quite ill at the time.  Subsequent Myoview  obtained on 06/09/2022 demonstrated no evidence of infarction with ischemia, small defect of mild reduction in uptake present in the apical apex location consistent with artifact, EF 47%.  Echocardiogram obtained on 07/05/2022 showed EF 45 to 50%, global hypokinesis, grade 2 DD, severe LAE, trivial MR mildly dilated ascending aorta measurement of 38 mm.  Heart monitor placed in February 2025 by PCP showed 12% A-fib burden.  He was started on Xarelto  and was seen in the A-fib clinic.  Due to underlying CAD, only antiarrhythmic option would be amiodarone .  He was started on amiodarone  therapy.    Patient was seen by Dr. HOLTS on 11/15/2023 who mentioned he was quite symptomatic while in A-fib.  Various options has been discussed with the patient and eventually decided to proceed with cardioversion with plan for A-fib ablation in August.  He underwent successful cardioversion on 11/20/2023.  Unfortunately, post cardioversion he developed worsening shortness of breath and was taken back to the hospital on the following day.  O2 saturation was in the high 80s by EMS.  He was found to be volume  overloaded and was given IV Lasix .  Echocardiogram obtained on 11/21/2023 showed EF 30 to 35%, global hypokinesis, grade 3 DD RVSP 42 mmHg, severe biatrial enlargement, small pericardial effusion.  During the hospitalization, irbesartan  held due to worsening renal function.  During hospitalization, he had recurrent A-fib however converted back to sinus rhythm prior to discharge.  Amiodarone  was increased to 200 mg twice a day for 1 week then 200 mg daily thereafter.  He was discharged on 40 mg daily of torsemide .  He has been following by pulmonology service due to mediastinal adenopathy seen on previous CT.  They plan to proceed with repeat CT image in Jan 2026.   He was scheduled for ablation on August 5 with Dr HOLTS but BP was too high and this was cancelled. Patient states he was told to hold all his medication prior to procedure. He is on multiple antihypertensives. Has follow up with Nephrology - Dr Micaela seen 8/20. Noted BP and renal function stable.   Patient presents today for follow-up accompanied by wife.  He states breathing is better than it was. Still notes palpitations at times. No edema. No chest pain.    ROS:   He denies chest pain, dyspnea, pnd, orthopnea, n, v, dizziness, syncope, edema, weight gain, or early satiety. All other systems reviewed and are otherwise negative except as noted above.    Studies Reviewed      Cardiac Studies & Procedures   ______________________________________________________________________________________________   STRESS TESTS  MYOCARDIAL PERFUSION  IMAGING 06/09/2022  Interpretation Summary   Findings are consistent with no prior ischemia. The study is intermediate risk.   No ST deviation was noted.   LV perfusion is abnormal. There is no evidence of ischemia. There is no evidence of infarction. Defect 1: There is a small defect with mild reduction in uptake present in the apical apex location(s) that is fixed. There is normal wall  motion in the defect area. Consistent with artifact.   Left ventricular function is abnormal. Global function is mildly reduced. End diastolic cavity size is moderately enlarged. End systolic cavity size is moderately enlarged.   Prior study not available for comparison.  Small size, mild intensity fixed apical perfusion defect, c/w apical thinning artifact. No reversible ischemia. LVEF 47% with moderately dilated LV and global hypokinesis. This is an intermediate risk study. No prior study for comparison.   ECHOCARDIOGRAM  ECHOCARDIOGRAM COMPLETE 11/21/2023  Narrative ECHOCARDIOGRAM REPORT    Patient Name:   [name] [name] Date of Exam: 11/21/2023 Medical Rec #:  [id number]        Height:       72.0 in Accession #:    [id number]       Weight:       227.5 lb Date of Birth:  Jul 05, 1945        BSA:          2.250 m Patient Age:    77 years         BP:           145/95 mmHg Patient Gender: M                HR:           71 bpm. Exam Location:  Inpatient  Procedure: 2D Echo, Color Doppler and Cardiac Doppler (Both Spectral and Color Flow Doppler were utilized during procedure).  Indications:    CHF-Acute Diastolic I50.31  History:        Patient has no prior history of Echocardiogram examinations, most recent 07/05/2022.  Sonographer:    Tinnie Gosling RDCS Referring Phys: [phone] RONDELL A SMITH  IMPRESSIONS   1. Left ventricular ejection fraction, by estimation, is 30 to 35%. The left ventricle has moderately decreased function. The left ventricle demonstrates global hypokinesis. The left ventricular internal cavity size was mildly dilated. There is mild concentric left ventricular hypertrophy. Left ventricular diastolic parameters are consistent with Grade III diastolic dysfunction (restrictive). Elevated left atrial pressure. 2. Right ventricular systolic function is normal. The right ventricular size is mildly enlarged. There is mildly elevated pulmonary artery systolic pressure.  The estimated right ventricular systolic pressure is 42.0 mmHg. 3. Left atrial size was severely dilated. 4. Right atrial size was severely dilated. 5. A small pericardial effusion is present. The pericardial effusion is circumferential. There is no evidence of cardiac tamponade. 6. The mitral valve is normal in structure. No evidence of mitral valve regurgitation. No evidence of mitral stenosis. 7. The aortic valve is tricuspid. There is mild calcification of the aortic valve. There is mild thickening of the aortic valve. Aortic valve regurgitation is not visualized. Aortic valve sclerosis/calcification is present, without any evidence of aortic stenosis. 8. The inferior vena cava is dilated in size with <50% respiratory variability, suggesting right atrial pressure of 15 mmHg.  Comparison(s): Note low QRS complex voltage on ECG. Consider restrictive/infiltrative cardiomyopathy such as cardiac amyloidosis, probably with superimposed tachycardia cardiomyopathy, as well as possible mechanical atrial stunning shortly after cardioversion.  FINDINGS Left  Ventricle: Left ventricular ejection fraction, by estimation, is 30 to 35%. The left ventricle has moderately decreased function. The left ventricle demonstrates global hypokinesis. The left ventricular internal cavity size was mildly dilated. There is mild concentric left ventricular hypertrophy. Left ventricular diastolic parameters are consistent with Grade III diastolic dysfunction (restrictive). Elevated left atrial pressure.  Right Ventricle: The right ventricular size is mildly enlarged. No increase in right ventricular wall thickness. Right ventricular systolic function is normal. There is mildly elevated pulmonary artery systolic pressure. The tricuspid regurgitant velocity is 2.60 m/s, and with an assumed right atrial pressure of 15 mmHg, the estimated right ventricular systolic pressure is 42.0 mmHg.  Left Atrium: Left atrial size was  severely dilated.  Right Atrium: Right atrial size was severely dilated.  Pericardium: A small pericardial effusion is present. The pericardial effusion is circumferential. There is no evidence of cardiac tamponade.  Mitral Valve: The mitral valve is normal in structure. No evidence of mitral valve regurgitation. No evidence of mitral valve stenosis.  Tricuspid Valve: The tricuspid valve is normal in structure. Tricuspid valve regurgitation is mild.  Aortic Valve: The aortic valve is tricuspid. There is mild calcification of the aortic valve. There is mild thickening of the aortic valve. Aortic valve regurgitation is not visualized. Aortic valve sclerosis/calcification is present, without any evidence of aortic stenosis.  Pulmonic Valve: The pulmonic valve was grossly normal. Pulmonic valve regurgitation is not visualized. No evidence of pulmonic stenosis.  Aorta: The aortic root and ascending aorta are structurally normal, with no evidence of dilitation.  Venous: The inferior vena cava is dilated in size with less than 50% respiratory variability, suggesting right atrial pressure of 15 mmHg.  IAS/Shunts: No atrial level shunt detected by color flow Doppler.   LEFT VENTRICLE PLAX 2D LVIDd:         5.80 cm   Diastology LVIDs:         4.90 cm   LV e' medial:    4.24 cm/s LV PW:         1.30 cm   LV E/e' medial:  23.1 LV IVS:        1.30 cm   LV e' lateral:   4.57 cm/s LVOT diam:     2.30 cm   LV E/e' lateral: 21.4 LV SV:         85 LV SV Index:   38 LVOT Area:     4.15 cm   RIGHT VENTRICLE             IVC RV S prime:     15.60 cm/s  IVC diam: 2.90 cm TAPSE (M-mode): 2.4 cm  LEFT ATRIUM              Index        RIGHT ATRIUM           Index LA diam:        5.30 cm  2.36 cm/m   RA Area:     24.20 cm LA Vol (A2C):   163.0 ml 72.43 ml/m  RA Volume:   77.10 ml  34.26 ml/m LA Vol (A4C):   160.0 ml 71.10 ml/m LA Biplane Vol: 165.0 ml 73.32 ml/m AORTIC VALVE LVOT Vmax:    97.80 cm/s LVOT Vmean:  70.300 cm/s LVOT VTI:    0.205 m  AORTA Ao Root diam: 3.10 cm Ao Asc diam:  3.40 cm  MITRAL VALVE  TRICUSPID VALVE MV Area (PHT): 5.58 cm    TR Peak grad:   27.0 mmHg MV Decel Time: 136 msec    TR Vmax:        260.00 cm/s MV E velocity: 97.90 cm/s MV A velocity: 39.90 cm/s  SHUNTS MV E/A ratio:  2.45        Systemic VTI:  0.20 m Systemic Diam: 2.30 cm  Jerel Balding MD Electronically signed by Jerel Balding MD Signature Date/Time: 11/21/2023/3:13:04 PM    Final   TEE  ECHO TEE 07/11/2017  Narrative *[place]* *[facility]* [address] [place], [place] [address] [phone]  ------------------------------------------------------------------- Transesophageal Echocardiography  Patient:    [name] [name], [name] [name] MR #:       [id number] Study Date: 07/11/2017 Gender:     M Age:        71 Height:     190.5 cm Weight:     111.4 kg BSA:        2.45 m^2 Pt. Status: Room:  ATTENDING    Samtani, Jai-Gurmukh [id number] PERFORMING   Leim Moose, M.D. BERNIS Dare, Ankit Chirag SONOGRAPHER  Fairy [place]on, RDCS ORDERING     Mcdaniel, Jill D REFERRING    Arvil Poe D  cc:  ------------------------------------------------------------------- LV EF: 55% -   60%  ------------------------------------------------------------------- History:   PMH:  Stroke  Risk factors:  Hypertension. Diabetes mellitus. Dyslipidemia.  ------------------------------------------------------------------- Study Conclusions  - Left ventricle: There was mild concentric hypertrophy. Systolic function was normal. The estimated ejection fraction was in the range of 55% to 60%. Wall motion was normal; there were no regional wall motion abnormalities. - Aorta: There was moderate non-mobile atheroma. - Left atrium: The atrium was dilated. No evidence of thrombus in the atrial cavity or appendage. No evidence of thrombus in  the atrial cavity or appendage. The appendage was morphologically a left appendage, multilobulated, and of normal size. Emptying velocity was normal. - Right atrium: No evidence of thrombus in the atrial cavity or appendage. - Atrial septum: No defect or patent foramen ovale was identified. - Tricuspid valve: There was mild regurgitation.  Impressions:  - No cardiac source of emboli was indentified.  ------------------------------------------------------------------- Study data:   Study status:  Routine.  Consent:  The risks, benefits, and alternatives to the procedure were explained to the patient and informed consent was obtained.  Procedure:  Initial setup. The patient was brought to the laboratory. Surface ECG leads were monitored. Sedation. Conscious sedation was administered by cardiology staff. Transesophageal echocardiography. Topical anesthesia was obtained using viscous lidocaine . A transesophageal probe was inserted by the attending cardiologist. Image quality was adequate.  Study completion:  The patient tolerated the procedure well. There were no complications.          Diagnostic transesophageal echocardiography.  2D and color Doppler. Birthdate:  Patient birthdate: 05-Apr-1946.  Age:  Patient is 78 yr old.  Sex:  Gender: male.    BMI: 30.7 kg/m^2.  Blood pressure: 223/109  Patient status:  Inpatient.  Study date:  Study date: 07/11/2017. Study time: 09:17 AM.  Location:  Endoscopy.  -------------------------------------------------------------------  ------------------------------------------------------------------- Left ventricle:  There was mild concentric hypertrophy. Systolic function was normal. The estimated ejection fraction was in the range of 55% to 60%. Wall motion was normal; there were no regional wall motion abnormalities.  ------------------------------------------------------------------- Aortic valve:   Trileaflet; normal thickness, mildly  calcified leaflets. Cusp separation was normal.  Doppler:  There was no significant regurgitation.  -------------------------------------------------------------------  Aorta:  There was moderate non-mobile atheroma. There was no evidence for dissection. Aortic root: The aortic root was not dilated. Ascending aorta: The ascending aorta was normal in size. Descending aorta: The descending aorta was normal in size.  ------------------------------------------------------------------- Mitral valve:   Structurally normal valve.   Leaflet separation was normal.  Doppler:  There was trivial regurgitation.  ------------------------------------------------------------------- Left atrium:  The atrium was dilated.  No evidence of thrombus in the atrial cavity or appendage.  No evidence of thrombus in the atrial cavity or appendage. The appendage was morphologically a left appendage, multilobulated, and of normal size. Emptying velocity was normal.  ------------------------------------------------------------------- Atrial septum:  No defect or patent foramen ovale was identified.  ------------------------------------------------------------------- Right ventricle:  The cavity size was normal. Wall thickness was normal. Systolic function was normal.  ------------------------------------------------------------------- Pulmonic valve:    Structurally normal valve.  ------------------------------------------------------------------- Tricuspid valve:   Structurally normal valve.   Leaflet separation was normal.  Doppler:  There was mild regurgitation.  ------------------------------------------------------------------- Pulmonary artery:   The main pulmonary artery was normal-sized.  ------------------------------------------------------------------- Right atrium:  The atrium was normal in size.  No evidence of thrombus in the atrial cavity or appendage. The appendage was morphologically a  right appendage.  ------------------------------------------------------------------- Pericardium:  There was no pericardial effusion.  ------------------------------------------------------------------- Prepared and Electronically Authenticated by  Leim Moose, M.D. 2019-01-08T11:19:37  MONITORS  LONG TERM MONITOR (3-14 DAYS) 08/25/2023  Narrative NSR with sinus brady (50/min) and sinus tachy (120/min) ave HR 70/min. PAF (12%) with rates of 79-169/min. No VT. NS SVT, longest 20 beats at 121/min, likely atrial tachycardia Isolated PVC's (8.4%) and PAC's (1%). No prolonged pauses. No symptoms recorded. Patch Wear Time:  4 days and 17 hours (2025-02-10T21:41:07-498 to 2025-02-15T14:52:41-0500)       ______________________________________________________________________________________________      Risk Assessment/Calculations  CHA2DS2-VASc Score = 8   This indicates a 10.8% annual risk of stroke. The patient's score is based upon: CHF History: 1 HTN History: 1 Diabetes History: 1 Stroke History: 2 Vascular Disease History: 1 Age Score: 2 Gender Score: 0           Physical Exam VS:  There were no vitals taken for this visit.   Wt Readings from Last 3 Encounters:  02/07/24 211 lb 6.4 oz (95.9 kg)  12/21/23 208 lb (94.3 kg)  12/13/23 207 lb (93.9 kg)    GEN: Well nourished, well developed in no acute distress NECK: No JVD; No carotid bruits CARDIAC: RRR, no murmurs, rubs, gallops RESPIRATORY:  Clear to auscultation without rales, wheezing or rhonchi  ABDOMEN: Soft, non-tender, non-distended EXTREMITIES:  No edema; No deformity   ASSESSMENT AND PLAN  Paroxysmal atrial fibrillation: on amiodarone  therapy.  S/p DCCV in May. Appears to be maintaining NSR although has some intermittent palpitations.Ablation was cancelled last month due to high BP. This has been stable and he is on multiple antihypertensive agents. Will check with Dr Cindie concerning  rescheduling ablation. He should take all antihypertensives on morning of procedure with water.   Chronic systolic heart failure: clinically stable.  I would like to repeat Echo to reassess LV function since he is back in NSR. Prior EF down to 30-35%.   Hypertension: Blood pressure is elevated today but readings at home have been much better as well as follow up with Nephrology. Continue valsartan 80 mg daily, amlodipine  5 mg daily, torsemide  20 mg daily, Imdur  30 mg daily, hydralazine  100 mg tid, labetalol  200 mg tid.   Hyperlipidemia: On Zetia .  Intolerant of statins  DM2: Managed by primary care provider         Dispo: Follow-up with me in 4 months.   Signed, Antonis Lor [place], MD

## 2024-03-05 ENCOUNTER — Ambulatory Visit (HOSPITAL_COMMUNITY): Admitting: Internal Medicine

## 2024-03-06 ENCOUNTER — Ambulatory Visit: Admitting: Emergency Medicine

## 2024-03-06 ENCOUNTER — Encounter: Payer: Self-pay | Admitting: Emergency Medicine

## 2024-03-06 VITALS — BP 149/68 | HR 57 | Resp 98 | Ht 75.0 in | Wt 214.0 lb

## 2024-03-06 DIAGNOSIS — R590 Localized enlarged lymph nodes: Secondary | ICD-10-CM

## 2024-03-06 NOTE — Patient Instructions (Signed)
  VISIT SUMMARY: Today, you were seen for a follow-up on your mediastinal adenopathy, which was first identified in March 2025. We reviewed your history, including your atrial fibrillation, chronic kidney disease, diabetes, hypertension, and other health conditions. We discussed the findings from your recent imaging studies and your current symptoms, including your chronic cough.  YOUR PLAN: -ENLARGED MEDIASTINAL LYMPH NODES: Mediastinal adenopathy means you have enlarged lymph nodes in the area between your lungs. Your recent CT and PET scans suggest that these enlarged nodes are likely not cancerous. We discussed the option of monitoring the condition versus doing a biopsy. You chose to monitor it for now. We will do another chest CT scan in January 2026 to check for any changes.  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 We have not identified a specific cause yet, but we will continue to monitor your symptoms.  INSTRUCTIONS: Please schedule a chest CT scan for January 2026. We will review the results to see if there are any changes in the size of your lymph nodes. Continue to monitor your symptoms and let us  know if anything changes or if you have any new concerns.

## 2024-03-06 NOTE — Progress Notes (Signed)
 Subjective:    Patient ID: [name] [name], male    DOB: 11/13/1945, 78 y.o.   MRN: [id number]  HPI Discussed the use of AI scribe software for clinical note transcription with the patient, who gave verbal consent to proceed.  History of Present Illness [name] [name] is a 78 year old male with mediastinal adenopathy who presents for evaluation. He was referred by Dr. Madelin Stank for evaluation of mediastinal adenopathy.  Mediastinal adenopathy was first identified on a CT scan in March 2025, performed due to shortness of breath. A follow-up CT scan in July 2025 showed persistent enlarged mediastinal lymph nodes, including a 2.0 cm subcarinal node. A PET scan in July 2025 showed no hypermetabolism in these nodes, and an area of ground glass in the lingula without abnormal uptake.  He has a history of atrial fibrillation and underwent a cardioversion procedure, after which he experienced discomfort and fluid accumulation in his chest, leading to hospitalization. An ablation was scheduled for August 2025 but was postponed due to elevated blood pressure. He is a stroke survivor and needs to manage his blood pressure carefully.  He required BiPAP on a hospital admission for acute pulmonary edema/CHF.  A sleep study in October 2024 showed no significant sleep apnea.  He has chronic kidney disease stage four, which is a consideration in his management, particularly regarding the use of contrast in imaging studies.  He has diabetes, hypertension, diastolic CHF, and a history of CVA. 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 No family history of lung cancer.    Results RADIOLOGY Chest CT: Multiple enlarged mediastinal nodes with a 2.0 cm subcarinal node (01/09/2024) PET scan: Persistent enlarged mediastinal lymph nodes without hypermetabolism; ground glass opacity in the lingula without abnormal uptake (01/26/2024)    Review of Systems As per  HPI  Past Medical History:  Diagnosis Date   Afib (HCC)    Anemia    Cataract    CHF (congestive heart failure) (HCC)    CKD (chronic kidney disease) stage 4, GFR 15-29 ml/min (HCC)    DIABETES MELLITUS, TYPE II 01/02/2007   HYPERLIPIDEMIA 06/18/2007   HYPERTENSION 01/02/2007   RENAL INSUFFICIENCY 02/05/2008   Stroke (HCC) 2019    Family History  Problem Relation Age of Onset   Hypertension Mother    Diabetes Mother    Stroke Father    Stroke Maternal Grandmother    Colon cancer Neg Hx    Esophageal cancer Neg Hx    Rectal cancer Neg Hx    Stomach cancer Neg Hx      Social History   Socioeconomic History   Marital status: Married    Spouse name: Not on file   Number of children: Not on file   Years of education: Not on file   Highest education level: Not on file  Occupational History   Not on file  Tobacco Use   Smoking status: Never   Smokeless tobacco: Never  Vaping Use   Vaping status: Never Used  Substance and Sexual Activity   Alcohol use: No   Drug use: No   Sexual activity: Not on file  Other Topics Concern   Not on file  Social History Narrative   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 Likes to go to football  games.    Social Drivers of Corporate investment banker Strain: Low Risk  (08/25/2023)   Overall Financial Resource Strain (CARDIA)    Difficulty of Paying Living Expenses: Not hard at all  Food Insecurity: No Food Insecurity (12/01/2023)   Hunger Vital Sign    Worried About Running Out of Food in the Last Year: Never true    Ran Out of Food in the Last Year: Never true  Transportation Needs: No Transportation Needs (12/01/2023)   PRAPARE - Administrator, Civil Service (Medical): No    Lack of Transportation (Non-Medical): No  Physical Activity: Inactive (08/25/2023)   Exercise Vital Sign    Days of Exercise per  Week: 0 days    Minutes of Exercise per Session: 0 min  Stress: No Stress Concern Present (08/25/2023)   [employer] of Occupational Health - Occupational Stress Questionnaire    Feeling of Stress : Not at all  Social Connections: Socially Integrated (11/21/2023)   Social Connection and Isolation Panel    Frequency of Communication with Friends and Family: More than three times a week    Frequency of Social Gatherings with Friends and Family: More than three times a week    Attends Religious Services: More than 4 times per year    Active Member of [employer] or Organizations: Yes    Attends Engineer, structural: More than 4 times per year    Marital Status: Married  Catering manager Violence: Not At Risk (11/21/2023)   Humiliation, Afraid, Rape, and Kick questionnaire    Fear of Current or Ex-Partner: No    Emotionally Abused: No    Physically Abused: No    Sexually Abused: No    Allergies  Allergen Reactions   Evolocumab  Hypertension   5-Alpha Reductase Inhibitors Other (See Comments)    myalgia    Iodinated Contrast Media     Avoid due to chronic kidney disease    Penicillins Hives   Statins     myalgia    Sulfa Antibiotics Hives, Itching and Swelling   Sulfa Drugs Cross Reactors Hives, Itching and Swelling   Sulfamethoxazole-Trimethoprim Hives, Itching and Swelling    Current Outpatient Medications on File Prior to Visit  Medication Sig Dispense Refill   ACCU-CHEK GUIDE TEST test strip USE TO CHECK BLOOD GLUCOSE IN THE MORNING, AT NOON, AND AT BEDTIME 100 strip 0   Accu-Chek Softclix Lancets lancets USE TO TEST THREE TIMES DAILY 100 each 0   amiodarone  (PACERONE ) 200 MG tablet Take 1 tablet (200 mg total) by mouth 2 (two) times daily for 7 days, THEN 1 tablet (200 mg total) daily. 104 tablet 0   amLODipine  (NORVASC ) 5 MG tablet Take 5 mg by mouth.     B Complex Vitamins (VITAMIN B COMPLEX) TABS Take 1 tablet by mouth every morning.     Blood Glucose Monitoring  Suppl DEVI 1 each by Does not apply route in the morning, at noon, and at bedtime. Use to check blood sugar TID May substitute to any manufacturer covered by patient's insurance. 1 each 0   calcitRIOL  (ROCALTROL ) 0.25 MCG capsule Take 0.25 mcg by mouth every other day.     Cholecalciferol  50 MCG (2000 UT) CAPS Take 2,000 Units by mouth daily.     ezetimibe  (ZETIA ) 10 MG tablet TAKE 1 TABLET(10 MG) BY MOUTH DAILY 90 tablet 3   ferrous sulfate 325 (65 FE) MG tablet Take 325 mg by mouth 2 (two) times daily with  a meal.     hydrALAZINE  (APRESOLINE ) 100 MG tablet Take 100 mg by mouth 3 (three) times daily.     insulin  glargine (LANTUS  SOLOSTAR) 100 UNIT/ML Solostar Pen ADMINISTER 9 UNITS UNDER THE SKIN TWICE DAILY 15 mL 0   isosorbide  mononitrate (IMDUR ) 30 MG 24 hr tablet Take 1 tablet (30 mg total) by mouth daily. 90 tablet 3   labetalol  (NORMODYNE ) 200 MG tablet TAKE 1 TABLET(200 MG) BY MOUTH THREE TIMES DAILY 270 tablet 3   latanoprost  (XALATAN ) 0.005 % ophthalmic solution Place 1 drop into both eyes at bedtime.     nateglinide  (STARLIX ) 60 MG tablet Take 60 mg by mouth daily.     potassium chloride  (KLOR-CON  M) 10 MEQ tablet Take 1 tablet (10 mEq total) by mouth daily. 90 tablet 1   Rivaroxaban  (XARELTO ) 15 MG TABS tablet TAKE 1 TABLET(15 MG) BY MOUTH DAILY WITH SUPPER 30 tablet 5   timolol  (TIMOPTIC ) 0.5 % ophthalmic solution Place 1 drop into both eyes 2 (two) times daily.     torsemide  (DEMADEX ) 20 MG tablet TAKE 2 TABLETS BY MOUTH DAILY 180 tablet 3   valsartan (DIOVAN) 160 MG tablet Take 80 mg by mouth.     No current facility-administered medications on file prior to visit.       Objective:    Vitals:   03/06/24 1557  BP: (!) 149/68  Pulse: (!) 57  Resp: (!) 98  Weight: 214 lb (97.1 kg)  Height: 6' 3 (1.905 m)   Physical Exam Gen: Pleasant, well-nourished, in no distress, somewhat stoic but normal affect  ENT: No lesions,  mouth clear,  oropharynx clear, no postnasal  drip  Neck: No JVD, no stridor  Lungs: No use of accessory muscles, no crackles or wheezing on normal respiration, no wheeze on forced expiration  Cardiovascular: RRR, heart sounds normal, no murmur or gallops, trace peripheral edema  Musculoskeletal: No deformities, no cyanosis or clubbing  Neuro: alert, awake, non focal  Skin: Warm, no lesions or rashes       Assessment & Plan:   Assessment & Plan   Assessment and Plan Assessment & Plan Enlarged mediastinal lymph nodes Persistent enlarged mediastinal lymph nodes on CT scan from March 2025, unchanged on repeat CT in July 2025. PET scan in July 2025 showed no hypermetabolism, indicating low likelihood of malignancy. Differential diagnosis includes benign causes such as fluid imbalance, inflammation, or past exposures.  Consider sarcoidosis. Low suspicion for active malignancy given stable size and negative PET scan. Discussed options including watchful waiting versus invasive biopsy. He prefers watchful waiting. Biopsy via bronchoscopy discussed as definitive but not pursued due to low suspicion and potential risks given current health priorities. - Order repeat chest CT in January 2026 without contrast due to CKD. - Review CT results to assess for any changes in lymph node size.  Chronic cough Chronic cough potentially multifactorial. No smoking or lung cancer. Possible contributing factors include medication side effects, postnasal drainage from allergies or congestion, or gastroesophageal reflux, though these are not currently suspected. Exposure to smoke as a IT sales professional noted. No specific cause identified at this time.     No follow-ups on file.   Lamar Chris, MD, PhD 03/06/2024, 4:31 PM [facility] [phone] or if no answer before 7:00PM call [phone] For any issues after 7:00PM please call eLink [phone]

## 2024-03-08 ENCOUNTER — Ambulatory Visit: Attending: Cardiology | Admitting: Cardiology

## 2024-03-08 VITALS — BP 152/70 | HR 58 | Ht 74.0 in | Wt 215.0 lb

## 2024-03-08 DIAGNOSIS — I480 Paroxysmal atrial fibrillation: Secondary | ICD-10-CM

## 2024-03-08 DIAGNOSIS — N184 Chronic kidney disease, stage 4 (severe): Secondary | ICD-10-CM

## 2024-03-08 DIAGNOSIS — I5022 Chronic systolic (congestive) heart failure: Secondary | ICD-10-CM

## 2024-03-08 DIAGNOSIS — I10 Essential (primary) hypertension: Secondary | ICD-10-CM | POA: Diagnosis not present

## 2024-03-08 NOTE — Patient Instructions (Signed)
 Medication Instructions:  Continue all medications *If you need a refill on your cardiac medications before your next appointment, please call your pharmacy*  Lab Work: None ordered  Testing/Procedures: None ordered  Follow-Up: At [facility], you and your health needs are our priority.  As part of our continuing mission to provide you with exceptional heart care, our providers are all part of one team.  This team includes your primary Cardiologist (physician) and Advanced Practice Providers or APPs (Physician Assistants and Nurse Practitioners) who all work together to provide you with the care you need, when you need it.  Your next appointment:  4 months   Call in Oct to schedule Jan appointment    Provider:  Dr.Jordan        Dr.Lambert's RN will call to reschedule Ablation   We recommend signing up for the patient portal called MyChart.  Sign up information is provided on this After Visit Summary.  MyChart is used to connect with patients for Virtual Visits (Telemedicine).  Patients are able to view lab/test results, encounter notes, upcoming appointments, etc.  Non-urgent messages can be sent to your provider as well.   To learn more about what you can do with MyChart, go to [email or web address].

## 2024-03-13 ENCOUNTER — Telehealth: Payer: Self-pay

## 2024-03-13 DIAGNOSIS — I480 Paroxysmal atrial fibrillation: Secondary | ICD-10-CM

## 2024-03-13 NOTE — Telephone Encounter (Signed)
 Spoke with the patient's wife and scheduled patient for an ablation on 10/14 at 730am.

## 2024-03-13 NOTE — Telephone Encounter (Signed)
-----   Message from CAMERON T [facility] sent at 03/09/2024  2:38 PM EDT ----- He will need to take his BP meds the morning of the procedure. Thanks! [name] [name] ----- Message ----- From: [place], Peter M, MD Sent: 03/08/2024  11:31 AM EDT To: [name] [name], MD  Looks like ablation cancelled last month due to high BP and renal function. Seen in follow up closely by Nephrology and renal function stable and BP improved. Sounds like he was told to hold all meds morning of procedure which might be why BP was so high. I saw today. ? Reschedule for ablation.   PJ

## 2024-03-20 ENCOUNTER — Other Ambulatory Visit: Payer: Self-pay | Admitting: Physician Assistant

## 2024-03-22 ENCOUNTER — Ambulatory Visit: Admitting: Adult Health

## 2024-03-22 ENCOUNTER — Telehealth (HOSPITAL_COMMUNITY): Payer: Self-pay

## 2024-03-22 DIAGNOSIS — M1000 Idiopathic gout, unspecified site: Secondary | ICD-10-CM | POA: Diagnosis not present

## 2024-03-22 DIAGNOSIS — N184 Chronic kidney disease, stage 4 (severe): Secondary | ICD-10-CM | POA: Diagnosis not present

## 2024-03-22 DIAGNOSIS — N189 Chronic kidney disease, unspecified: Secondary | ICD-10-CM | POA: Diagnosis not present

## 2024-03-22 DIAGNOSIS — I10 Essential (primary) hypertension: Secondary | ICD-10-CM | POA: Diagnosis not present

## 2024-03-22 DIAGNOSIS — E1169 Type 2 diabetes mellitus with other specified complication: Secondary | ICD-10-CM | POA: Diagnosis not present

## 2024-03-22 DIAGNOSIS — D649 Anemia, unspecified: Secondary | ICD-10-CM | POA: Diagnosis not present

## 2024-03-22 DIAGNOSIS — I509 Heart failure, unspecified: Secondary | ICD-10-CM | POA: Diagnosis not present

## 2024-03-22 NOTE — Telephone Encounter (Signed)
 Spoke with patient's wife Lucienne, to complete pre-procedure call/ DPR on file.     Health status review:  Any new medical conditions, recent signs of acute illness or been started on antibiotics? No Any recent hospitalizations or surgeries? No Any new medications started since pre-op visit? No  Follow all medication instructions prior to procedure or the procedure may be rescheduled:    Continue taking Xarelto  (Rivaroxaban ) once daily without missing any doses before procedure. Essential chronic medications:  Yes. Per Dr. Cindie, on the morning of your procedure please take the following medications with a small sip of water: hydralazine , labetolol, valsartan, and amlodipine .  Hold all other medications including Xarelto .   Nothing to eat or drink after midnight prior to your procedure.  Pre-procedure testing scheduled: lab work by September 23 by PCP.  Confirmed patient is scheduled for Atrial Fibrillation Ablation on Monday, October 13 with Dr. Ole Cindie. Instructed patient to arrive at the Main Entrance A at [facility]: [address] [place], [place] [address] and check in at Admitting at 5:30 AM.  Advised of plan to go home the same day and will only stay overnight if medically necessary. You MUST have a responsible adult to drive you home and MUST be with you the first 24 hours after you arrive home or your procedure could be cancelled.  Informed a nurse will call a day before the procedure to confirm arrival time and ensure instructions are followed.  Patient's wife verbalized understanding to information provided and is agreeable to proceed with procedure.   Advised to contact RN Navigator at [phone], to inform of any new medications started after call or concerns prior to procedure.

## 2024-03-25 ENCOUNTER — Telehealth: Payer: Self-pay

## 2024-03-25 NOTE — Telephone Encounter (Signed)
-----   Message from Nurse Carlyle C sent at 03/20/2024  9:31 AM EDT ----- Regarding: FW: 10/13 ablation Procedure is on 10/13 at 7:30am, NOT 10/14. Sorry for any confusion! Thanks! Carly ----- Message ----- From: Chauvigne, Carlyle, RN Sent: 03/20/2024   8:34 AM EDT To: Shannon Kirkendall, CMA; [place] MALVA Sever, RN;# Subject: 10/13 ablation                                  Precert:  MD: Cindie Type of ablation: A-fib Diagnosis: A-fib CPT code: A-fib (06343) Ablation scheduled (date/time): 10/14 at 7:30am  Procedure:  Added to calendar? Yes Orders entered? Yes Letter complete? Yes Scheduled with cath lab? Yes Any medications to hold? No - patient will need to take his BP meds on the morning of this procedure per Dr. Cindie Leos ordered (CBC, BMET, PT/INR if on warfarin): Yes Mapping system: Doesn't matter CARTO/OPAL rep notified? No Cardiac CT needed? No Dye allergy? No Pre-meds ordered and instructions given? No, not needed Letter method: MyChart Device: No  Follow-up:  Cassie/Angel, please schedule Routine.  Covering RN - please send this message to [employer], EP scheduler, EP Scheduling pool, EP [employer], and CT scheduler ([place] Lynch/Stephanie Mogg), if indicated.

## 2024-03-25 NOTE — Telephone Encounter (Signed)
Work up complete. 

## 2024-03-26 ENCOUNTER — Encounter: Payer: Self-pay | Admitting: Adult Health

## 2024-03-26 ENCOUNTER — Ambulatory Visit (INDEPENDENT_AMBULATORY_CARE_PROVIDER_SITE_OTHER): Admitting: Adult Health

## 2024-03-26 VITALS — BP 150/88 | HR 61 | Temp 98.5°F | Ht 74.0 in | Wt 215.0 lb

## 2024-03-26 DIAGNOSIS — E1122 Type 2 diabetes mellitus with diabetic chronic kidney disease: Secondary | ICD-10-CM

## 2024-03-26 DIAGNOSIS — E119 Type 2 diabetes mellitus without complications: Secondary | ICD-10-CM

## 2024-03-26 DIAGNOSIS — I10 Essential (primary) hypertension: Secondary | ICD-10-CM

## 2024-03-26 DIAGNOSIS — I480 Paroxysmal atrial fibrillation: Secondary | ICD-10-CM | POA: Diagnosis not present

## 2024-03-26 DIAGNOSIS — N184 Chronic kidney disease, stage 4 (severe): Secondary | ICD-10-CM | POA: Diagnosis not present

## 2024-03-26 DIAGNOSIS — Z01818 Encounter for other preprocedural examination: Secondary | ICD-10-CM | POA: Diagnosis not present

## 2024-03-26 DIAGNOSIS — Z794 Long term (current) use of insulin: Secondary | ICD-10-CM

## 2024-03-26 DIAGNOSIS — Z7984 Long term (current) use of oral hypoglycemic drugs: Secondary | ICD-10-CM

## 2024-03-26 LAB — CBC
HCT: 37.5 % — ABNORMAL LOW (ref 39.0–52.0)
Hemoglobin: 12.1 g/dL — ABNORMAL LOW (ref 13.0–17.0)
MCHC: 32.4 g/dL (ref 30.0–36.0)
MCV: 84.1 fl (ref 78.0–100.0)
Platelets: 254 K/uL (ref 150.0–400.0)
RBC: 4.46 Mil/uL (ref 4.22–5.81)
RDW: 15.5 % (ref 11.5–15.5)
WBC: 7.5 K/uL (ref 4.0–10.5)

## 2024-03-26 LAB — BASIC METABOLIC PANEL WITH GFR
BUN: 39 mg/dL — ABNORMAL HIGH (ref 6–23)
CO2: 25 meq/L (ref 19–32)
Calcium: 9.8 mg/dL (ref 8.4–10.5)
Chloride: 104 meq/L (ref 96–112)
Creatinine, Ser: 3.14 mg/dL — ABNORMAL HIGH (ref 0.40–1.50)
GFR: 18.41 mL/min — ABNORMAL LOW (ref >60.00)
Glucose, Bld: 144 mg/dL — ABNORMAL HIGH (ref 70–99)
Potassium: 3.9 meq/L (ref 3.5–5.1)
Sodium: 139 meq/L (ref 135–145)

## 2024-03-26 LAB — POCT GLYCOSYLATED HEMOGLOBIN (HGB A1C): Hemoglobin A1C: 7.1 % — AB (ref 4.0–5.6)

## 2024-03-26 LAB — MICROALBUMIN / CREATININE URINE RATIO
Creatinine,U: 69.6 mg/dL
Microalb Creat Ratio: 245.9 mg/g — ABNORMAL HIGH (ref 0.0–30.0)
Microalb, Ur: 17.1 mg/dL — ABNORMAL HIGH (ref 0.0–1.9)

## 2024-03-26 NOTE — Patient Instructions (Signed)
 Health Maintenance Due  Topic Date Due   Zoster Vaccines- Shingrix (1 of 2) Never done   Diabetic kidney evaluation - Urine ACR  06/17/2008   DTaP/Tdap/Td (2 - Tdap) 11/14/2019   COVID-19 Vaccine (5 - 2025-26 season) 03/04/2024       08/25/2023    3:11 PM 09/16/2022    4:08 PM 08/17/2022   10:06 AM  Depression screen PHQ 2/9  Decreased Interest 0 0 0  Down, Depressed, Hopeless 0 0 0  PHQ - 2 Score 0 0 0  Altered sleeping  3   Tired, decreased energy  1   Change in appetite  0   Feeling bad or failure about yourself   0   Trouble concentrating  0   Moving slowly or fidgety/restless  0   Suicidal thoughts  0   PHQ-9 Score  4   Difficult doing work/chores  Not difficult at all

## 2024-03-26 NOTE — Progress Notes (Signed)
 Subjective:    Patient ID: [name] [name], male    DOB: Feb 07, 1946, 78 y.o.   MRN: [id number]  HPI 78 year old male who  has a past medical history of Afib (HCC), Anemia, Cataract, CHF (congestive heart failure) (HCC), CKD (chronic kidney disease) stage 4, GFR 15-29 ml/min (HCC), DIABETES MELLITUS, TYPE II (01/02/2007), HYPERLIPIDEMIA (06/18/2007), HYPERTENSION (01/02/2007), RENAL INSUFFICIENCY (02/05/2008), and Stroke (HCC) (2019).  He presents to the office today for follow up regarding DM and HTN  Diabetes mellitus type 2-managed with Starlix  60 mg daily and Lantus  9 units in the morning. His wife reports that there are times when his blood sugar are in the 180 but mostly his blood sugars in the 120 range. He was recently in the hospital and was noted to have elevated blood sugars.  Lab Results  Component Value Date   HGBA1C 7.1 (A) 03/26/2024   HGBA1C 7.7 (A) 12/21/2023   HGBA1C 6.9 (A) 09/20/2023   Hypertension with chronic kidney disease stage IV-managed by nephrology. He is currently on amlodipine  5 mg once a day, hydralazine  100 mg 3 times a day, labetalol  200 mg 3 times a day, torsemide  20 mg every other day, valsartan 80 mg 2 times a day. He does check his blood pressure at home with readings and they report readings mostly in the 120 -140 systolic but have dropped down to 109 systolic. He has not taken his blood pressure medication this morning.  BP Readings from Last 3 Encounters:  03/26/24 (!) 150/88  03/08/24 (!) 152/70  03/06/24 (!) 149/68   Preoperative Clearance -  He is having cardiac ablation on 04/15/2024 for PAF- his Cardiologist placed blood work to done but they are wondering if he can get the blood work done here.   Review of Systems See HPI   Past Medical History:  Diagnosis Date   Afib (HCC)    Anemia    Cataract    CHF (congestive heart failure) (HCC)    CKD (chronic kidney disease) stage 4, GFR 15-29 ml/min (HCC)    DIABETES MELLITUS, TYPE II  01/02/2007   HYPERLIPIDEMIA 06/18/2007   HYPERTENSION 01/02/2007   RENAL INSUFFICIENCY 02/05/2008   Stroke (HCC) 2019    Social History   Socioeconomic History   Marital status: Married    Spouse name: Not on file   Number of children: Not on file   Years of education: Not on file   Highest education level: Not on file  Occupational History   Not on file  Tobacco Use   Smoking status: Never   Smokeless tobacco: Never  Vaping Use   Vaping status: Never Used  Substance and Sexual Activity   Alcohol use: No   Drug use: No   Sexual activity: Not on file  Other Topics Concern   Not on file  Social History Narrative   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 Likes to go to football games.    Social Drivers of Corporate investment banker Strain: Low Risk  (08/25/2023)   Overall Financial Resource Strain (CARDIA)    Difficulty of Paying Living Expenses: Not hard at all  Food Insecurity: No Food Insecurity (12/01/2023)   Hunger Vital Sign    Worried About Running Out of Food in the Last Year: Never true    Ran Out  of Food in the Last Year: Never true  Transportation Needs: No Transportation Needs (12/01/2023)   PRAPARE - Administrator, Civil Service (Medical): No    Lack of Transportation (Non-Medical): No  Physical Activity: Inactive (08/25/2023)   Exercise Vital Sign    Days of Exercise per Week: 0 days    Minutes of Exercise per Session: 0 min  Stress: No Stress Concern Present (08/25/2023)   [employer] of Occupational Health - Occupational Stress Questionnaire    Feeling of Stress : Not at all  Social Connections: Socially Integrated (11/21/2023)   Social Connection and Isolation Panel    Frequency of Communication with Friends and Family: More than three times a week    Frequency of Social Gatherings with Friends and Family: More than three  times a week    Attends Religious Services: More than 4 times per year    Active Member of [employer] or Organizations: Yes    Attends Banker Meetings: More than 4 times per year    Marital Status: Married  Catering manager Violence: Not At Risk (11/21/2023)   Humiliation, Afraid, Rape, and Kick questionnaire    Fear of Current or Ex-Partner: No    Emotionally Abused: No    Physically Abused: No    Sexually Abused: No    Past Surgical History:  Procedure Laterality Date   CARDIOVERSION N/A 11/20/2023   Procedure: CARDIOVERSION;  Surgeon: Francyne Headland, MD;  Location: [facility] INVASIVE CV LAB;  Service: Cardiovascular;  Laterality: N/A;   LOOP RECORDER INSERTION N/A 07/11/2017   Procedure: LOOP RECORDER INSERTION;  Surgeon: Kelsie Agent, MD;  Location: [facility] INVASIVE CV LAB;  Service: Cardiovascular;  Laterality: N/A;   SHOULDER SURGERY     left   TEE WITHOUT CARDIOVERSION N/A 07/11/2017   Procedure: TRANSESOPHAGEAL ECHOCARDIOGRAM (TEE);  Surgeon: Maranda Leim DEL, MD;  Location: [facility] ENDOSCOPY;  Service: Cardiovascular;  Laterality: N/A;   TRANSURETHRAL RESECTION OF PROSTATE     history of retention/hematuria     Family History  Problem Relation Age of Onset   Hypertension Mother    Diabetes Mother    Stroke Father    Stroke Maternal Grandmother    Colon cancer Neg Hx    Esophageal cancer Neg Hx    Rectal cancer Neg Hx    Stomach cancer Neg Hx     Allergies  Allergen Reactions   Evolocumab  Hypertension   5-Alpha Reductase Inhibitors Other (See Comments)    myalgia    Iodinated Contrast Media     Avoid due to chronic kidney disease    Penicillins Hives   Statins     myalgia    Sulfa Antibiotics Hives, Itching and Swelling   Sulfa Drugs Cross Reactors Hives, Itching and Swelling   Sulfamethoxazole-Trimethoprim Hives, Itching and Swelling    Current Outpatient Medications on File Prior to Visit  Medication Sig Dispense Refill   ACCU-CHEK GUIDE TEST test strip USE  TO CHECK BLOOD GLUCOSE IN THE MORNING, AT NOON, AND AT BEDTIME 100 strip 0   Accu-Chek Softclix Lancets lancets USE TO TEST THREE TIMES DAILY 100 each 0   amiodarone  (PACERONE ) 200 MG tablet TAKE 1 TABLET(200 MG) BY MOUTH DAILY 90 tablet 3   amLODipine  (NORVASC ) 5 MG tablet Take 5 mg by mouth.     B Complex Vitamins (VITAMIN B COMPLEX) TABS Take 1 tablet by mouth every morning.     Blood Glucose Monitoring Suppl (ACCU-CHEK GUIDE) w/Device KIT 3 (three) times daily.  Blood Glucose Monitoring Suppl DEVI 1 each by Does not apply route in the morning, at noon, and at bedtime. Use to check blood sugar TID May substitute to any manufacturer covered by patient's insurance. 1 each 0   calcitRIOL  (ROCALTROL ) 0.25 MCG capsule Take 0.25 mcg by mouth every other day.     Cholecalciferol  50 MCG (2000 UT) CAPS Take 2,000 Units by mouth daily.     ezetimibe  (ZETIA ) 10 MG tablet TAKE 1 TABLET(10 MG) BY MOUTH DAILY 90 tablet 3   ferrous sulfate 325 (65 FE) MG tablet Take 325 mg by mouth 2 (two) times daily with a meal.     hydrALAZINE  (APRESOLINE ) 100 MG tablet Take 100 mg by mouth 3 (three) times daily.     insulin  glargine (LANTUS  SOLOSTAR) 100 UNIT/ML Solostar Pen ADMINISTER 9 UNITS UNDER THE SKIN TWICE DAILY 15 mL 0   isosorbide  mononitrate (IMDUR ) 30 MG 24 hr tablet Take 1 tablet (30 mg total) by mouth daily. 90 tablet 3   labetalol  (NORMODYNE ) 200 MG tablet TAKE 1 TABLET(200 MG) BY MOUTH THREE TIMES DAILY 270 tablet 3   latanoprost  (XALATAN ) 0.005 % ophthalmic solution Place 1 drop into both eyes at bedtime.     nateglinide  (STARLIX ) 60 MG tablet Take 60 mg by mouth daily.     potassium chloride  (KLOR-CON  M) 10 MEQ tablet Take 1 tablet (10 mEq total) by mouth daily. 90 tablet 1   Rivaroxaban  (XARELTO ) 15 MG TABS tablet TAKE 1 TABLET(15 MG) BY MOUTH DAILY WITH SUPPER 30 tablet 5   timolol  (TIMOPTIC ) 0.5 % ophthalmic solution Place 1 drop into both eyes 2 (two) times daily.     torsemide  (DEMADEX ) 20 MG  tablet TAKE 2 TABLETS BY MOUTH DAILY 180 tablet 3   valsartan (DIOVAN) 160 MG tablet Take 80 mg by mouth.     No current facility-administered medications on file prior to visit.    BP (!) 150/88   Pulse 61   Temp 98.5 F (36.9 C) (Oral)   Ht 6' 2 (1.88 m)   Wt 215 lb (97.5 kg)   SpO2 98%   BMI 27.60 kg/m       Objective:   Physical Exam Vitals and nursing note reviewed.  Constitutional:      Appearance: Normal appearance.  Cardiovascular:     Rate and Rhythm: Normal rate and regular rhythm.     Pulses: Normal pulses.     Heart sounds: Normal heart sounds.  Pulmonary:     Effort: Pulmonary effort is normal.     Breath sounds: Normal breath sounds.  Skin:    General: Skin is warm and dry.  Neurological:     General: No focal deficit present.     Mental Status: He is alert and oriented to person, place, and time.  Psychiatric:        Mood and Affect: Mood normal.        Behavior: Behavior normal.        Thought Content: Thought content normal.        Judgment: Judgment normal.         Assessment & Plan:  1. Diabetes mellitus treated with oral medication (HCC) (Primary)  - POC HgB A1c- 7.1 0 has improved. Continue with Starlix  60 mg daily  - Microalbumin/Creatinine Ratio, Urine; Future  2. Type 2 diabetes mellitus with stage 4 chronic kidney disease, with long-term current use of insulin  (HCC) - Continue with Lantus  9 units daily  - POC HgB  A1c - Microalbumin/Creatinine Ratio, Urine; Future  3. Essential hypertension - Well controlled. No change in medication  - Microalbumin/Creatinine Ratio, Urine; Future  4. CKD (chronic kidney disease) stage 4, GFR 15-29 ml/min (HCC)  - Microalbumin/Creatinine Ratio, Urine; Future  5. Preoperative clearance - Will order blood work and forward to Dr. Cindie  - Basic Metabolic Panel; Future - CBC; Future  6. Paroxysmal atrial fibrillation (HCC) -  SR today.  - Follow up with Cardiology for ablation   Darleene Shape, NP

## 2024-03-27 ENCOUNTER — Ambulatory Visit: Payer: Self-pay | Admitting: Adult Health

## 2024-04-02 ENCOUNTER — Telehealth: Payer: Self-pay | Admitting: Cardiology

## 2024-04-02 NOTE — Telephone Encounter (Signed)
 Pt had BMP done at PCP office for Cath procedure. She wants to make sure the lab was good for the procedure.

## 2024-04-02 NOTE — Telephone Encounter (Addendum)
 RN called patient  - left message on voice mail   Patient is schedule for 04/15/24 - atrial  fibrillation ablation     Rn can not tell whether lab  result have not been review by Dr Cindie.  RN will send this message for review and response.      03/26/24 labs obtain by primary   Component     Latest Ref Rng 03/26/2024  Sodium     135 - 145 mEq/L 139   Potassium     3.5 - 5.1 mEq/L 3.9   Chloride     96 - 112 mEq/L 104   CO2     19 - 32 mEq/L 25   Glucose     70 - 99 mg/dL 855 (H)   BUN     6 - 23 mg/dL 39 (H)   Creatinine     0.40 - 1.50 mg/dL 6.85 (H)   GFR     >39.99 mL/min 18.41 (L)   Calcium     8.4 - 10.5 mg/dL 9.8   WBC     4.0 - 89.4 K/uL 7.5   RBC     4.22 - 5.81 Mil/uL 4.46   Platelets     150.0 - 400.0 K/uL 254.0   Hemoglobin     13.0 - 17.0 g/dL 87.8 (L)   HCT     60.9 - 52.0 % 37.5 (L)   MCV     78.0 - 100.0 fl 84.1   MCHC     30.0 - 36.0 g/dL 67.5   RDW     88.4 - 84.4 % 15.5     Legend: (H) High (L) Low

## 2024-04-03 ENCOUNTER — Other Ambulatory Visit: Payer: Self-pay | Admitting: Adult Health

## 2024-04-03 NOTE — Telephone Encounter (Signed)
 Spoke with the patient's wife and advised on recommendations from Dr. Cindie in regards to cancelling his ablation and continuing on amiodarone . Wife states that the patient is having trouble on his amiodarone  and would like to discuss other options. Appointment has been made for 10/14.

## 2024-04-04 ENCOUNTER — Other Ambulatory Visit: Payer: Self-pay | Admitting: Adult Health

## 2024-04-05 NOTE — Progress Notes (Signed)
 HALO LASKI                                          MRN: [id number]   04/05/2024   The VBCI Quality Team Specialist reviewed this patient medical record for the purposes of chart review for care gap closure. The following were reviewed: chart review for care gap closure-kidney health evaluation for diabetes:eGFR .    VBCI Quality Team

## 2024-04-09 ENCOUNTER — Telehealth: Payer: Self-pay | Admitting: Adult Health

## 2024-04-09 NOTE — Telephone Encounter (Signed)
 Copied from CRM [id number]. Topic: General - Other >> Apr 08, 2024  4:13 PM Martinique E wrote: Reason for CRM: Patient's spouse, Lucienne, called in requesting to speak with patient's PCP/nurse in regards to a surgery concern. Please call [name] at [phone].

## 2024-04-10 NOTE — Telephone Encounter (Signed)
 Spouse Lucienne stated that surgery was cancelled and she is concerned. Lucienne stated that the surgeon did not call them for updates or to even let them know that it was cancelled. Lucienne called to speak to the nurse and the nurse stated that the provider is not going to do the surgery due to pt kidneys. She stated she is not sure why the surgeon canceled if all the specialty providers and PCP is ok with surgery. No alternative was advised and Lucienne had to scheduled pt for 04/16/24 for this issue. Lucienne will keep [employer].

## 2024-04-15 ENCOUNTER — Ambulatory Visit (HOSPITAL_COMMUNITY): Admission: RE | Admit: 2024-04-15 | Source: Home / Self Care | Admitting: Cardiology

## 2024-04-15 ENCOUNTER — Encounter (HOSPITAL_COMMUNITY): Admission: RE | Payer: Self-pay | Source: Home / Self Care

## 2024-04-15 SURGERY — ATRIAL FIBRILLATION ABLATION
Anesthesia: General

## 2024-04-15 NOTE — Progress Notes (Unsigned)
 Electrophysiology Office Follow up Visit Note:    Date:  04/16/2024   ID:  DURREL MCNEE, DOB 12-Oct-1945, MRN [id number]  PCP:  Merna Huxley, NP  [facility] Cardiologist:  [name], MD  [facility] Electrophysiologist:  OLE [name], MD    Interval History:     [name] [name] is a 78 y.o. male who presents for a follow up visit.   The patient has a history of atrial fibrillation and flutter.  He also has a history of hypertension.  He was scheduled for catheter ablation in the past but given severely enlarged atria, the procedure was canceled.  Medical therapy was being pursued.  He is on amiodarone  and at the last EKG on February 06, 2024, he was in sinus rhythm.  Repeat echo was planned but has not been completed.  He is with his wife today in clinic for previously met.  We had a long discussion today about his complicated medical history and situation.  He tells me that he is feeling back pain when he wakes up in the morning.  He also reports some fatigue.       Past medical, surgical, social and family history were reviewed.  ROS:   Please see the history of present illness.    All other systems reviewed and are negative.  EKGs/Labs/Other Studies Reviewed:    The following studies were reviewed today:  Nov 21, 2023 echo EF 30 to 35% RV normal Severely dilated left and right atrium Small pericardial effusion    EKG Interpretation Date/Time:  Tuesday April 16 2024 10:11:50 EDT Ventricular Rate:  55 PR Interval:  208 QRS Duration:  118 QT Interval:  506 QTC Calculation: 484 R Axis:   51  Text Interpretation: Sinus bradycardia with marked sinus arrhythmia Confirmed by HOLTS OLE [phone]) on 04/16/2024 10:17:59 AM    Physical Exam:    VS:  BP (!) 145/55 (BP Location: Left Arm, Patient Position: Sitting, Cuff Size: Large)   Pulse (!) 55   Ht 6' 3 (1.905 m)   Wt 216 lb (98 kg)   SpO2 97%   BMI 27.00 kg/m     Wt Readings from  Last 3 Encounters:  04/16/24 216 lb (98 kg)  03/26/24 215 lb (97.5 kg)  03/08/24 215 lb (97.5 kg)     GEN: no distress CARD: RRR, No MRG RESP: No IWOB. CTAB.      ASSESSMENT:    1. Persistent atrial fibrillation (HCC)   2. Encounter for long-term (current) use of high-risk medication   3. Chronic systolic heart failure (HCC)    PLAN:    In order of problems listed above:  #Chronic systolic heart failure Rhythm control is indicated.  Follows with Dr. Swaziland.  Care is complicated by abnormal kidney function. Repeat echo now that he has maintained sinus rhythm.   #Persistent atrial fibrillation #High risk med monitoring-amiodarone  In sinus rhythm on the most recent EKG. Continue amiodarone , decrease dose to 100 mg by mouth once daily to see if this helps with side effects Continue Xarelto   I reviewed his medical history in detail during today's clinic appointment.  Given his significantly abnormal kidney function and reduced EF, I think he is at very high risk for complication during a catheter ablation procedure.  I would recommend medical management for now.  After our discussion, the patient and his family agree that this was the best strategy moving forward.  I discussed my transition way from [facility] during  today's clinic appointment.  He will transition his care to one of my EP partners, Dr. Kennyth  Follow-up 4 months with EP APP   Signed, Ole Holts, MD, [facility], [facility] 04/16/2024 10:39 AM    Electrophysiology [facility]

## 2024-04-16 ENCOUNTER — Encounter: Payer: Self-pay | Admitting: Cardiology

## 2024-04-16 ENCOUNTER — Other Ambulatory Visit (HOSPITAL_COMMUNITY): Payer: Self-pay

## 2024-04-16 ENCOUNTER — Ambulatory Visit: Attending: Cardiology | Admitting: Cardiology

## 2024-04-16 ENCOUNTER — Telehealth: Payer: Self-pay | Admitting: Pharmacy Technician

## 2024-04-16 VITALS — BP 145/55 | HR 55 | Ht 75.0 in | Wt 216.0 lb

## 2024-04-16 DIAGNOSIS — I4819 Other persistent atrial fibrillation: Secondary | ICD-10-CM | POA: Diagnosis not present

## 2024-04-16 DIAGNOSIS — I5022 Chronic systolic (congestive) heart failure: Secondary | ICD-10-CM

## 2024-04-16 DIAGNOSIS — Z79899 Other long term (current) drug therapy: Secondary | ICD-10-CM

## 2024-04-16 MED ORDER — AMIODARONE HCL 100 MG PO TABS: 100.0000 mg | ORAL_TABLET | Freq: Every day | ORAL | 3 refills | Status: DC

## 2024-04-16 MED ORDER — AMIODARONE HCL 200 MG PO TABS: 100.0000 mg | ORAL_TABLET | Freq: Every day | ORAL | 3 refills | Status: DC

## 2024-04-16 NOTE — Patient Instructions (Addendum)
 Medication Instructions:  Your physician has recommended you make the following change in your medication:  1) DECREASE amiodarone  to 100 mg once daily  *If you need a refill on your cardiac medications before your next appointment, please call your pharmacy*  Testing/Procedures: Echocardiogram  Your physician has requested that you have an echocardiogram. Echocardiography is a painless test that uses sound waves to create images of your heart. It provides your doctor with information about the size and shape of your heart and how well your heart's chambers and valves are working. This procedure takes approximately one hour. There are no restrictions for this procedure. Please do NOT wear cologne, perfume, aftershave, or lotions (deodorant is allowed). Please arrive 15 minutes prior to your appointment time.  Please note: We ask at that you not bring children with you during ultrasound (echo/ vascular) testing. Due to room size and safety concerns, children are not allowed in the ultrasound rooms during exams. Our front office staff cannot provide observation of children in our lobby area while testing is being conducted. An adult accompanying a patient to their appointment will only be allowed in the ultrasound room at the discretion of the ultrasound technician under special circumstances. We apologize for any inconvenience.  Follow-Up: At [facility], you and your health needs are our priority.  As part of our continuing mission to provide you with exceptional heart care, our providers are all part of one team.  This team includes your primary Cardiologist (physician) and Advanced Practice Providers or APPs (Physician Assistants and Nurse Practitioners) who all work together to provide you with the care you need, when you need it.  Your next appointment:   3-4 months  Provider:   Fonda Kitty, MD

## 2024-04-16 NOTE — Telephone Encounter (Signed)
 Prescription has been changed.

## 2024-04-16 NOTE — Telephone Encounter (Signed)
 Hi, insurance said they will only pay for amiodarone  200mg . Can this be sent in as 200mg  1/2 tablet a day since they are decreasing to 100mg .

## 2024-04-16 NOTE — Telephone Encounter (Signed)
Please advise change. 

## 2024-04-16 NOTE — Addendum Note (Signed)
 Addended by: CHAUVIGNE, Rainier Feuerborn on: 04/16/2024 12:19 PM   Modules accepted: Orders

## 2024-04-18 ENCOUNTER — Other Ambulatory Visit (HOSPITAL_COMMUNITY): Payer: Self-pay

## 2024-04-26 ENCOUNTER — Ambulatory Visit: Payer: Self-pay

## 2024-04-26 ENCOUNTER — Other Ambulatory Visit (HOSPITAL_COMMUNITY): Payer: Self-pay

## 2024-04-26 ENCOUNTER — Telehealth: Payer: Self-pay | Admitting: Cardiology

## 2024-04-26 NOTE — Telephone Encounter (Signed)
 Returned pt's spouse, Lucienne, who is approved by pt's DPR to discuss medical decisions; identified pt using 2 identifiers; Pt is having difficulty receiving his new prescription with lower dose of Amiodarone .  States [employer] stated that Amiodarone  100 mg  will cost pt out of pocket in excess of $400/month.  I told the pt's wife that I would send his new prescription to our med assist/pre authorization team to see if they can offer further assistance.  She thanked me for my call

## 2024-04-26 NOTE — Telephone Encounter (Signed)
 Pt c/o medication issue:  1. Name of Medication:   amiodarone  (PACERONE ) 200 MG tablet    2. How are you currently taking this medication (dosage and times per day)? As written   3. Are you having a reaction (difficulty breathing--STAT)? no  4. What is your medication issue? Pt need a prior authorization for medication and Dr was going change dose to  100 mg.    Current pharmacy:  [facility] [id number] - [place], [place] - [address]   Pt is out of medication

## 2024-04-26 NOTE — Telephone Encounter (Signed)
 Called CAL, Norma, regarding patient out of Amiodarone  medication and dosage was previously lowered with cardiologist. Pt's wife reports someone from the clinic called, but did not leave name.  Madeline reports will forward message and have Nurse Marjorie to call pt's wife back.

## 2024-04-26 NOTE — Telephone Encounter (Signed)
 FYI Only or Action Required?: FYI only for provider.  Patient was last seen in primary care on 03/26/2024 by Merna Huxley, NP.  Called Nurse Triage reporting Medication Refill.  Symptoms began today.  Interventions attempted: Other: Wife called cardiology office today to attempt to have prescription filled.  Symptoms are: unchanged.  Triage Disposition: No disposition on file.  Patient/caregiver understands and will follow disposition?:   Copied from CRM [id number]. Topic: Clinical - Red Word Triage >> Apr 26, 2024  1:49 PM Deaijah H wrote: Red Word that prompted transfer to Nurse Triage: Patients wife called in amiodarone  (PACERONE ) 200 MG tablet discontinued until surgery , but surgery was cancelled but cardiologist wanted him to take for longer term but cut dosage, but insurance will not cover cut dosage. [facility] is stating he had a 90 supply, didn't have any pills and took last pill yesterday. Only need emergency supply to get through the weekend. Would like medical advice on how to get through weekend. No appointment available for same day, next is 10/27 Haven't been in A-FIB, but some SOB, a lot of exhaustion, hot and cold, limbs kind of shaky more than usual, diarrhea.. Not himself.  Answer Assessment - Initial Assessment Questions Spoke with pts wife Lucienne (on [place]). Reports pts symptoms of SOB,fatigue, feeling hot and cold, shakey limbs and diarrhea have been going on for over a year and there have been no significant changes. He is currently at baseline with no acute distress.  Main concern: Wife reports pt out of amiodarone  and just took last dose yesterday. Was taking 200 mg daily but had rx decreased to 100 mg daily per cardiology on 10/14. Pt still had bottle of 200 mg tablets so was instructed to finish bottle by cutting pills in half, then switch to rx for 100 mg tabs. 100 gm tab rx sent to [facility] but rx for 100 mg tabs significantly more expensive. Wife contacted  cardiology before closing today at 1200 and they resent with prior auth but med is still not ready today. Wife attempted to get rx filled through [facility] where it was originally sent but they state pt should still have some. Wife asking how she can get a limited fill on meds. This RN was attempting to get more information to assist before caller hung up. Called back and LVM to call back.    1. NAME of MEDICINE: What medicine(s) are you calling about?     Amiodarone  200 mg tab, take 1/2 tab (100 mg) daily  2. QUESTION: What is your question? (e.g., double dose of medicine, side effect)     See above  3. PRESCRIBER: Who prescribed the medicine? Reason: if prescribed by specialist, call should be referred to that group.     Cindie Ole DASEN, MD  4. SYMPTOMS: Do you have any symptoms? If Yes, ask: What symptoms are you having?  How bad are the symptoms (e.g., mild, moderate, severe)     No  Protocols used: Medication Question Call-A-AH

## 2024-04-26 NOTE — Telephone Encounter (Signed)
 Pt spouse returned call to f/u on medication please advise

## 2024-04-26 NOTE — Telephone Encounter (Signed)
 FYI Only or Action Required?: Action required by provider: request for appointment, medication refill request, and clinical question for provider.  Patient was last seen in primary care on 03/26/2024 by Merna Huxley, NP.  Called Nurse Triage reporting Medication Refill.  Symptoms began today.  Interventions attempted: Nothing.  Symptoms are: unchanged.  Triage Disposition: No disposition on file.  Patient/caregiver understands and will follow disposition?:    Answer Assessment - Initial Assessment Questions 1. REASON FOR CALL: What is the main reason for your call? or How can I best help you?  Patient is completely out of Amiodarone  200 mg; last took Amiodarone  200 mg yesterday.  New prescription for Amiodarone  100 mg was sent by cardiologist; however needs prior authorization. Cost too high without insurance.  During LOV 04/16/24, the cardiologist reduced Amiodarone  200 mg to 100 mg.  Cardiologist office is closed and need further recommendations.   Advised wife to contact cardiology office.  Protocols used: Information Only Call - No Triage-A-AH

## 2024-04-26 NOTE — Telephone Encounter (Signed)
 Lf 200mg  1 a day on 03/20/24 #90/90ds too soon until 05/26/24

## 2024-04-29 NOTE — Telephone Encounter (Signed)
 Spoke to the wife and they paid for 30 days of amiodarone  from [employer] since the insurance wont pay until 11/23. She said they didn't get the 90 days from [facility] in sept. [facility] said [facility] did.

## 2024-04-29 NOTE — Telephone Encounter (Signed)
Lmom for wife to call back

## 2024-05-03 NOTE — Progress Notes (Signed)
 Outpatient Follow Up Visit - [facility]  [address], [address]. [place], [place] [address] Tel:  [phone] Fax: [phone]  Patient Name: [name] [name] Date of Service: 05/03/2024  Patient DOB: 12-12-1945 Provider Creating Note: Adeyinka A Adegoroye, MD  Patient MRN: [id number] Primary Care Physician: Merna Huxley, NP   Additional Physicians/ Providers:      Reason for follow up visit:  Chronic Kidney Disease   SUBJECTIVE  History of Present Illness [name] [name] is a 78 y.o. male who presents on 05/03/2024 regarding Chronic Kidney Disease  Mr. [name] is a 78 year old African-American man with medical history that includes stage 3 chronic kidney disease, diabetes mellitus, hyperlipidemia, gout, CVA in January 2019 with left MCA involvement and transient left leg weakness, hypertension, anemia, BPH, CHF, atrial fibrillation, degenerative joint disease, frequent urinary tract infections, secondary hyperparathyroidism, prior history of prior urinary retention and bilateral hydronephrosis in a setting of BPH in 2012, right knee injury with quadriceps tendon tear, medial meniscus tear following fall.  Renal History: He was initially seen in 01/2018 for management of stage 3 chronic kidney disease. He previously been under the care of Dr. Lonna of [facility] [place] but had wanted to transfer his care. Laboratory studies provided by the referring provider had shown a serum creatinine of 2.58 mg/dL with BUN of 35 mg/dL and estimated GFR of 68.24 ml/min on 09/21/17. Review of prior laboratory studies on the Epic EHR indicates that in the period between January 2013 and January 2019, the serum creatinine had ranged mostly between 1.7 and 2.5 mg/dL. In 2012, he developed urinary retention with bilateral hydronephrosis, requiring TURP in 11/2010 that was complicated by hematuria with clots for which he had bladder irrigation procedures, cystoscopy,  clot removal and TURP revision. Of note, serum creatinine ranged between 1.71 and 2.56 mg/dl. Identified risk factors for kidney disease were noted to include diabetes mellitus, hypertension, prior chronic hydronephrosis with obstructive uropathy, advancing age, possible atherosclerotic vascular disease.  ===   He presents today for follow up of Chronic Kidney Disease He is accompanied by his wife. Her last visit was on 03/22/2024. He feels okay. He however has a tendency for pain all over his body, along with weakness, dizziness and problems with balance that improves through the day.   He did not have the planned cardiac ablation procedure after all - as per the Cardiac EP physician, Dr. Cindie: '...given his significantly abnormal kidney function and reduced EF, I think he is at very high risk for complication during a catheter ablation procedure. I would recommend medical management for now. After our discussion, the patient and his family agree that this was the best strategy moving forward...'   He has recent episode of chest pain. No SOB, except on exertion. No palpitations.  No feet / leg swelling.  Blood pressure readings have been good.  Appetite is good. Fluid intake is usually about 70 oz per day. He tries to limit his dietary salt intake.  Blood glucose levels before breakfast have mostly been in the 120 to 140 range. He rarely has hypoglycemic episodes.   No dysuria. There is mild nocturia. No urgency or stress incontinence. No hematuria. Urine sometimes has a foamy appearance.    Medications were reviewed and reconciled. OTC medications: Vitamin B Complex, Iron, Vitamin D3  The following portions of the patient's chart were reviewed in this encounter and updated as appropriate:  Allergies  Meds  Problems  Med Hx  Surg  Hx  Fam Hx      PAST MEDICAL HISTORY: - As stated above.  SURGICAL HISTORY: - TURP. - Cystoscopy, removal of clots and revision of TURP. - Left  shoulder surgery.  - Bilateral cataract extraction with lens implant on 12/13/22 (left) and 12/27/2022 (right).   Review of Systems  Constitutional:  Positive for malaise/fatigue. Negative for chills, fever, weight gain and weight loss.  HENT:  Positive for congestion. Negative for ear pain, hearing loss, sore throat and tinnitus.   Eyes:  Positive for blurred vision and impaired. Negative for pain.  Respiratory:  Negative for cough, sputum production, shortness of breath and wheezing.   Cardiovascular:  Negative for chest pain, palpitations and leg swelling.  Gastrointestinal:  Positive for abdominal pain (sometimes) and diarrhea (depending on meals). Negative for constipation, heartburn, nausea, vomiting and poor appetite.  Genitourinary:  Positive for urinary incontinence and nocturia (mild). Negative for dysuria, frequency, hematuria and urgency.  Musculoskeletal:  Positive for joint pain (right and left knees; hips), myalgias and muscle cramps. Negative for back pain, falls and neck pain.  Skin:  Negative for itching, rash and wound.       Dryness+  Neurological:  Positive for dizziness, tremors and weakness. Negative for tingling, seizures, numbness and headaches.       - Sometimes he has unsteady balance with gait  Endo/Heme/Allergies:  Negative for environmental allergies. Bruises/bleeds easily.  Psychiatric/Behavioral:  Negative for depression. The patient is not nervous/anxious and does not have insomnia.     Allergies Evolocumab , 5-alpha reductase inhibitors, Iodinated contrast media, Penicillins, Statins, Sulfa antibiotics, and Sulfamethoxazole-trimethoprim  Medications   Current Outpatient Medications:  .  amiodarone  (PACERONE ) 100 MG tablet, Take 100 mg by mouth 1 (one) time each day, Disp: , Rfl:  .  amLODIPine  (NORVASC ) 5 MG tablet, TAKE 1 TABLET(5 MG) BY MOUTH 1 TIME EACH DAY, Disp: 90 tablet, Rfl: 3 .  B Complex Vitamins (vitamin B complex) tablet, Take 1 tablet by  mouth 1 (one) time each day, Disp: , Rfl:  .  calcitriol  (ROCALTROL ) 0.25 MCG capsule, Take 1 capsule (0.25 mcg total) by mouth every other day, Disp: 90 capsule, Rfl: 3 .  Cholecalciferol  50 MCG (2000 UT) capsule, Take 1 capsule by mouth 1 (one) time each day, Disp: , Rfl:  .  ezetimibe  (ZETIA ) 10 MG tablet, Take 1 tablet by mouth 1 (one) time each day, Disp: , Rfl:  .  ferrous sulfate 325 (65 Fe) MG tablet, Take 1 tablet by mouth in the morning and 1 tablet in the evening., Disp: , Rfl:  .  hydrALAZINE  100 MG tablet, TAKE 1 TABLET(100 MG TOTAL) BY MOUTH IN THE MORNING, 1 TABLET IN THE EVENING AND 1 TABLET BEFORE BEDTIME, Disp: 180 tablet, Rfl: 5 .  insulin  glargine (Lantus  SoloStar) 100 UNIT/ML injection, Inject 9 Units under the skin 1 (one) time each day Inject 9 units in the morning, Disp: , Rfl:  .  isosorbide  mononitrate (IMDUR ) 30 MG 24 hr tablet, Take 30 mg by mouth in the morning., Disp: , Rfl:  .  labetalol  (NORMODYNE ) 200 MG tablet, Take 1 tablet by mouth 3 (three) times a day, Disp: , Rfl:  .  latanoprost  (XALATAN ) 0.005 % ophthalmic solution, Administer 1 drop into affected eye(s) at bed time, Disp: , Rfl:  .  nateglinide  (STARLIX ) 60 MG tablet, TAKE 1 TABLET BY MOUTH EVERY DAY WITH MEALS, Disp: 90 tablet, Rfl: 3 .  potassium chloride  (KLOR-CON  M10) 10 MEQ CR tablet, Take  10 mEq by mouth every other day, Disp: , Rfl:  .  rivaroxaban  (XARELTO ) 15 MG tablet, Take 15 mg by mouth 1 (one) time each day, Disp: , Rfl:  .  timolol  (TIMOPTIC ) 0.5 % ophthalmic solution, Administer 1 drop into affected eye(s) in the morning and 1 drop before bedtime., Disp: , Rfl:  .  torsemide  (DEMADEX ) 20 MG tablet, Take 20 mg by mouth every other day, Disp: , Rfl:  .  valsartan (DIOVAN) 160 MG tablet, TAKE 1 TABLET BY MOUTH EVERY DAY, Disp: 30 tablet, Rfl: 5   History Past Medical History:  Diagnosis Date  . Atrial fibrillation (HCC)   . Cystic kidney disease    History reviewed. No pertinent surgical  history. Family History  Problem Relation Age of Onset  . Kidney disease Mother   . Diabetes Sibling        sister  . Diabetes Mother   . Diabetes Father   . Hypertension Sibling        sister  . Hypertension Father   . Hypertension Mother   . Stroke Father   . Dementia Mother   . Cancer Child        rare form of Gastrointestinal stomal tumor   Social History   Tobacco Use  . Smoking status: Never  . Smokeless tobacco: Never  Substance Use Topics  . Alcohol use: Not on file    Comment: Alcoholic Drinks/day: Occasional social drink    OBJECTIVE   Vitals:   05/03/24 0838  BP: 132/80  Pulse: 60  Resp: 16  Temp: 97.9 F  Weight: 215 lb (97.5 kg)  Height: 6' 1.5 (1.867 m)    Constitutional: He is oriented to person, place, and time. He does not appear ill. No distress.  HEENT:  Nose: Nose normal. Mouth/Throat: Oropharynx is clear and moist. No oropharyngeal exudate.  Eyes: EOM are normal. Pupils are equal, round, and reactive to light. No scleral icterus.  Neck: No JVD present. No thyromegaly present.  Cardiovascular:  Normal rate, regular rhythm and intact distal pulses.           No murmur heard.He exhibits no edema.  Pulmonary/Chest: Effort normal and breath sounds normal. No respiratory distress. He has no wheezes. He has no rales.  Abdominal: Soft. Bowel sounds are normal. He exhibits no distension. There is no abdominal tenderness.  Musculoskeletal: He exhibits no tenderness, deformity or decreased range of motion.  Lymphadenopathy:    He has no cervical adenopathy.  Neurological: He is alert and oriented to person, place, and time.  Skin: Skin is warm and dry. No rash noted. He is not diaphoretic. No erythema. No pallor.  Psychiatric: He has a normal mood and affect. His behavior is normal. Judgment normal.  - Flat affect    Laboratory Studies  Chemistry  Lab Units 03/26/24 0000 02/21/24 0000 02/13/24 0000 01/31/24 0000 12/28/23 0000 12/21/23 0000  12/06/23 0000 11/29/23 0000 11/24/23 0000 11/23/23 0000 11/22/23 0000 09/18/23 0000 08/02/23 0000 06/20/23 0000 06/06/23 0000 01/31/23 0000 09/27/22 0000 06/14/22 0957 06/03/22 0000  SODIUM  139 140 142 141 140  --  141 138 139 140   < > 142 139 140 140 140 141   < > 141  POTASSIUM  3.9 4.6 4.5 3.7 4.5  --  3.4 3.4 3.6 3.4   < > 4.7 4.0 3.8 4.8 4.8 4.4   < > 3.6  CHLORIDE  104.0 103 105 105.0 104  --  105.0 101.0 106.0 106.0   < >  108 108.0 106.0 109 105 108   < > 105  CO2 mmol/L 25 28 28 18 26   --  27 28 23 23    < > 24 23 25 27 24 24    < > 26  BUN mg/dL 39* 37* 41* 37* 44*  --  34* 45* 38* 36*   < > 36* 39* 36* 43* 39* 42*   < > 43  CREATININE mg/dL 6.85* 6.62* 6.56* 6.92* 3.27*  --  2.99* 3.92* 3.46* 3.26*   < > 2.79* 3.07* 3.01* 3.17* 3.23* 3.15*   < > 3.09  ALBUMIN g/dL  --  4.3  --   --  4.5  --   --   --   --   --   --  4.4 4.3 4.3 4.3 4.5 4.5   < >  --   EGFR mL/min/1.20m2  --  18* 18*  --  19*  --   --   --   --   --   --  23* 19.01 19.48 20* 19* 20*   < >  --   EGFRAFR  18.41  --   --  20.0  --   --  19.57 14.14  --   --   --   --   --   --   --   --   --   --  19.01  EGFRNAFR   --   --   --  20  20  --   --   --   --   --   --   --   --   --   --   --   --   --   --   --   GLUCOSE  144 252* 182* 214* 221*  --  165 224* 283* 176   < > 151* 185 118 189* 196* 134*   < > 137  CALCIUM mg/dL 9.8 9.7 9.5 9.5 9.6  --  9.6 9.4 8.8 8.9   < > 9.2 9.3 9.4 9.5 9.9 9.4   < > 9.3  PHOSPHORUS mg/dL  --  3.7  --   --  3.5  --   --   --   --  3.7  --  3.5 3.7  --  3.5 3.5 4.2   < >  --   MAGNESIUM  mg/dL  --  1.9  --   --  2.0  --   --   --   --  1.9  --  2.0 1.9  --  2.0 2.0 2.0   < >  --   ALK PHOS U/L  --  61  --   --  61  --   --   --   --   --   --  59 58 57 57 58 52   < >  --   PTH pg/mL  --  195*  --   --  169*  --   --   --   --   --   --  183*  --   --  159* 228* 239*   < >  --   VIT D 25 HYDROXY ng/mL  --  51  --   --  54  --   --   --   --   --   --  46  --   --  47 48 48   < >  --    WBC AUTO  10*3/ML 7.5 6.9  --  8.2 7.6  --   --  7.4 8.9 9.0  --  8.6  --   --  8.5 7.8 8.1   < > 9.1  HEMOGLOBIN A1C  7.1*  --   --   --   --  7.7*  --   --   --   --   --   --   --  7.1* 6.9*  --   --   --  6.6  HEMOGLOBIN  12.1* 11.7*  --  12.0* 12.0*  --   --  11.9* 11.9* 11.9*  --  11.4*  --   --  12.2* 12.2* 11.4*   < > 11.3  HEMATOCRIT  37.5* 37.4*  --  37.8* 38.4*  --   --  36.1* 34.7* 34.4*   < > 35.6*  --   --  37.7* 38.4* 33.9*   < > 35.6  PLATELETS AUTO 10*3/UL 254 254  --  246 196  --   --  305 304 318  --  247  --   --  248 238 242   < > 270   < > = values in this interval not displayed.   Urine  Lab Units 02/13/24 0000 12/28/23 0000 Oct 10, 2023 0000  PROT/CREAT RATIO UR mg/g creat 0.630*  630* 0.623*  623* 0.411*  411*    Lab Results  Component Value Date   COLORU YELLOW 02/13/2024   KETONESU NEGATIVE 02/13/2024   RBCU NONE SEEN 02/13/2024   UROBILINOGEN 0.2 12/24/2021   Iron Studies  Lab Units 02/21/24 0000 10/10/2023 0000 01/31/23 0000  IRON mcg/dL 48* 37* 69  TIBC mcg/dL (calc) 743 730 708  IRON SATURATION % (calc) 19* 14* 24   CBC  Lab Units 03/26/24 0000 02/21/24 0000  WBC AUTO 10*3/ML 7.5 6.9  RBC AUTO Million/uL  --  4.28  HEMOGLOBIN  12.1* 11.7*  HEMATOCRIT  37.5* 37.4*  MCV  84.1 87.4  MCH pg  --  27.3  MCHC g/dL  --  68.6*  RDW %  --  16.4*  PLATELETS AUTO 10*3/UL 254 254  MPV fL  --  11.5  NEUTROS PCT AUTO %  --  62  BM LYMPHOCYTES %  --  23.8  MONO %  --  6.5  BASOS PCT AUTO %  --  0.9  NEUTROS ABS cells/uL  --  4,278  LYMPHS ABS AUTO cells/uL  --  1,642  EOS ABS AUTO cells/uL  --  469  BASOS ABS AUTO cells/uL  --  62    Imaging and Other Studies  06/21/2023: Renal Ultrasound: Right Kidney:  Renal measurements: 10.6 x 5.5 x 4.8 cm = volume: 143.1 mL. Normal renal cortical thickness. Increased cortical echogenicity. No hydronephrosis. 9 mm stone. There is a 1.9 cm cyst. No imaging follow-up needed.  Left Kidney:  Renal  measurements: 10.3 x 7.0 x 4.6 cm = volume: 173.9 mL. Echogenicity within normal limits. No mass or hydronephrosis visualized.  Bladder:  Appears normal for degree of bladder distention.  Other:  None.  IMPRESSION:  1. No hydronephrosis.  2. Increased right renal cortical echogenicity as can be seen with medical renal disease.  3. Right renal stone.   GLENWOOD Bard Moats M.D.    ___   01/12/2021: Renal Ultrasound Right Kidney: Renal measurements: 10.6 x 7.0 x 7.5 cm = volume: 292 mL. Renal cortical echogenicity is normal and cortical thickness has been preserved. Several stable simple cortical  and parapelvic cysts are again identified measuring up to 18 mm in greatest dimension. Since the prior examinations, there has developed a 1.1 x 1.8 x 1.5 cm echogenic solid cortical mass within the a upper pole of the right kidney which is indeterminate on this examination. There is an adjacent tiny focus of calcification identified, similar to that noted on prior CT examination of 01/28/2019. An apparent 3.1 cm solid upper pole mass is likely artifactual represents the right renal upper polar cortex images obliquely. Apparent 11 mm calculus within the right renal pelvis is likely artifactual and represents urothelium and prominent renal sinus fat. There is no hydronephrosis.  Left Kidney: Renal measurements: 11.3 x 5.9 x 7.9 cm = volume: 274 mL. Renal cortical echogenicity is normal and cortical thickness has been preserved. No intrarenal masses or calcifications are seen. There is no hydronephrosis.  Bladder: The bladder appears mildly distended and the bladder wall is trabeculated suggesting changes of a chronic bladder outlet obstruction. No intraluminal mass or debris is identified.    GLENWOOD Dorethia Molt MD  ___    Assessment and Plan:   Problem List Items Addressed This Visit    1.  Stage IV chronic kidney disease. - Recent events noted and reviewed in detail.  Latest available laboratory studies  obtained on March 26, 2024, revealed the serum creatinine level to be 3.14 mg/dL with BUN of 39 mg/dL and estimated GFR of 81.5.  The serum creatinine level had previously been in the range of 2.99 and 3.43 mg/dL, in the timeframe of June 2025 to August 2025. As previously noted, he has variable proteinuria.  On February 13, 2024, the random urine protein/creatinine ratio was 0.63/g creatinine.  Very likely, he has diabetic nephropathy.  He continues to be on angiotensin receptor blocker, valsartan and manage dose of 160 mg once a day. Will follow-up renal function studies today.  Will also check urinalysis and random urine protein/creatinine ratio. Reiterated with patient and his wife, that his current renal function is stable and does not prohibit him from having cardiac ablation procedure done. Examined with patient and his wife, continued instances in which he feels weak in the mornings with pain and dizziness.  This may be a function of relatively low blood pressure readings.  In addition he may have relative hypoglycemia as well.  Discussed that he may need to have use of continuous glucose monitor.  2.  Hypertension. - Blood pressure reading today is acceptable.  He is currently on amlodipine  5 mg once a day, hydralazine  100 mg 3 times a day, labetalol  200 mg 3 times a day, torsemide  20 mg every other day and valsartan 160 mg every day.  3.  Type 2 diabetes mellitus. - Latest hemoglobin A1c was 7.1% on March 26, 2024.  He continues to be on insulin  glargine/Lantus  9 units once a day, nateglinide /Starlix  60 mg once a day with meal.  4.  Congestive heart failure. - He is fairly stable today.  Not in overt heart failure/fluid overload on today's physical examination.  He continues to be on torsemide  at a dose of 20 mg every other day.  5.  Gout. - No recent gout flare.    6.  CKD mineral bone disorder. -Will follow-up parameters today. Previously, on February 21, 2024, the PTH was 195 with  serum calcium of 9.7, phosphorus 3.7 and 25-hydroxy vitamin D  of 51.  He is currently on calcitriol  0.25 mcg every other day and vitamin D  2000 units once a  day.  7.  Peripheral arterial disease. - Follow-up with vascular surgery.  8.  Anemia. - Stable.  Latest hemoglobin was 12.1 on March 26, 2024.  He continues to be on ferrous sulfate 325 mg 2 times a day.  Latest iron saturation was 19% on February 21, 2024.  Continue to monitor hemoglobin/hematocrit.  9.  Renal cyst/mass. - Latest renal ultrasound in December 2024, did not reveal any masses or concerning lesion.  10.  Hypokalemia. He continues to be on potassium chloride  at a dose of 10 mEq every other day.  Latest serum potassium level was 3.9 on March 26, 2024.  Follow-up level today.  Laboratory studies requested today: CMP, iron profile, magnesium , PTH, phosphorus, urinalysis, random urine protein/creatinine ratio.    Return in about 2 months (around 07/03/2024).   Adeyinka A Adegoroye, MD

## 2024-05-03 NOTE — Progress Notes (Signed)
 Outpatient Follow Up Visit - [facility]   [address], [address]. [place], [place] [address] Tel:  [phone] Fax: [phone]   Patient Name: [name] [name] Date of Service: 05/03/2024  Patient DOB: [birth year] Provider Creating Note: Adeyinka A Adegoroye, MD  Patient MRN: [id number] Primary Care Physician: Merna Huxley, NP    Additional Physicians/ Providers:                             Reason for follow up visit:  Chronic Kidney Disease     SUBJECTIVE  History of Present Illness [name] [name] is a 78 y.o. male who presents on 05/03/2024 regarding Chronic Kidney Disease   [name] [name] is a 78 year old African-American man with medical history that includes stage 3 chronic kidney disease, diabetes mellitus, hyperlipidemia, gout, CVA in January 2019 with left MCA involvement and transient left leg weakness, hypertension, anemia, BPH, CHF, atrial fibrillation, degenerative joint disease, frequent urinary tract infections, secondary hyperparathyroidism, prior history of prior urinary retention and bilateral hydronephrosis in a setting of BPH in 2012, right knee injury with quadriceps tendon tear, medial meniscus tear following fall.  Renal History: He was initially seen in 01/2018 for management of stage 3 chronic kidney disease. He previously been under the care of Dr. Lonna of [facility], [place] but had wanted to transfer his care. Laboratory studies provided by the referring provider had shown a serum creatinine of 2.58 mg/dL with BUN of 35 mg/dL and estimated GFR of 68.24 ml/min on 09/21/17. Review of prior laboratory studies on the Epic EHR indicates that in the period between January 2013 and January 2019, the serum creatinine had ranged mostly between 1.7 and 2.5 mg/dL. In 2012, he developed urinary retention with bilateral hydronephrosis, requiring TURP in 11/2010 that was complicated by hematuria with clots for which he had bladder  irrigation procedures, cystoscopy, clot removal and TURP revision. Of note, serum creatinine ranged between 1.71 and 2.56 mg/dl. Identified risk factors for kidney disease were noted to include diabetes mellitus, hypertension, prior chronic hydronephrosis with obstructive uropathy, advancing age, possible atherosclerotic vascular disease.  ===    He presents today for follow up of Chronic Kidney Disease He is accompanied by his wife. Her last visit was on 03/22/2024. He feels okay. He however has a tendency for pain all over his body, along with weakness, dizziness and problems with balance that improves through the day.    He did not have the planned cardiac ablation procedure after all - as per the Cardiac EP physician, Dr. Cindie: '...given his significantly abnormal kidney function and reduced EF, I think he is at very high risk for complication during a catheter ablation procedure. I would recommend medical management for now. After our discussion, the patient and his family agree that this was the best strategy moving forward...'   He has recent episode of chest pain. No SOB, except on exertion. No palpitations.  No feet / leg swelling.  Blood pressure readings have been good.  Appetite is good. Fluid intake is usually about 70 oz per day. He tries to limit his dietary salt intake.  Blood glucose levels before breakfast have mostly been in the 120 to 140 range. He rarely has hypoglycemic episodes.   No dysuria. There is mild nocturia. No urgency or stress incontinence. No hematuria. Urine sometimes has a foamy appearance.    Medications were reviewed and reconciled. OTC medications: Vitamin B Complex,  Iron, Vitamin D3   The following portions of the patient's chart were reviewed in this encounter and updated as appropriate:  Allergies  Meds  Problems  Med Hx  Surg Hx  Fam Hx      PAST MEDICAL HISTORY: - As stated above.  SURGICAL HISTORY: - TURP. - Cystoscopy, removal of  clots and revision of TURP. - Left shoulder surgery.  - Bilateral cataract extraction with lens implant on 12/13/22 (left) and 12/27/2022 (right).    Review of Systems   Constitutional:  Positive for malaise/fatigue. Negative for chills, fever, weight gain and weight loss.  HENT:  Positive for congestion. Negative for ear pain, hearing loss, sore throat and tinnitus.   Eyes:  Positive for blurred vision and impaired. Negative for pain.  Respiratory:  Negative for cough, sputum production, shortness of breath and wheezing.   Cardiovascular:  Negative for chest pain, palpitations and leg swelling.  Gastrointestinal:  Positive for abdominal pain (sometimes) and diarrhea (depending on meals). Negative for constipation, heartburn, nausea, vomiting and poor appetite.  Genitourinary:  Positive for urinary incontinence and nocturia (mild). Negative for dysuria, frequency, hematuria and urgency.  Musculoskeletal:  Positive for joint pain (right and left knees; hips), myalgias and muscle cramps. Negative for back pain, falls and neck pain.  Skin:  Negative for itching, rash and wound.       Dryness+  Neurological:  Positive for dizziness, tremors and weakness. Negative for tingling, seizures, numbness and headaches.       - Sometimes he has unsteady balance with gait  Endo/Heme/Allergies:  Negative for environmental allergies. Bruises/bleeds easily.  Psychiatric/Behavioral:  Negative for depression. The patient is not nervous/anxious and does not have insomnia.       Allergies Evolocumab , 5-alpha reductase inhibitors, Iodinated contrast media, Penicillins, Statins, Sulfa antibiotics, and Sulfamethoxazole-trimethoprim   Medications   Current Medications    Current Outpatient Medications:  .  amiodarone  (PACERONE ) 100 MG tablet, Take 100 mg by mouth 1 (one) time each day, Disp: , Rfl:  .  amLODIPine  (NORVASC ) 5 MG tablet, TAKE 1 TABLET(5 MG) BY MOUTH 1 TIME EACH DAY, Disp: 90 tablet, Rfl: 3 .  B  Complex Vitamins (vitamin B complex) tablet, Take 1 tablet by mouth 1 (one) time each day, Disp: , Rfl:  .  calcitriol  (ROCALTROL ) 0.25 MCG capsule, Take 1 capsule (0.25 mcg total) by mouth every other day, Disp: 90 capsule, Rfl: 3 .  Cholecalciferol  50 MCG (2000 UT) capsule, Take 1 capsule by mouth 1 (one) time each day, Disp: , Rfl:  .  ezetimibe  (ZETIA ) 10 MG tablet, Take 1 tablet by mouth 1 (one) time each day, Disp: , Rfl:  .  ferrous sulfate 325 (65 Fe) MG tablet, Take 1 tablet by mouth in the morning and 1 tablet in the evening., Disp: , Rfl:  .  hydrALAZINE  100 MG tablet, TAKE 1 TABLET(100 MG TOTAL) BY MOUTH IN THE MORNING, 1 TABLET IN THE EVENING AND 1 TABLET BEFORE BEDTIME, Disp: 180 tablet, Rfl: 5 .  insulin  glargine (Lantus  SoloStar) 100 UNIT/ML injection, Inject 9 Units under the skin 1 (one) time each day Inject 9 units in the morning, Disp: , Rfl:  .  isosorbide  mononitrate (IMDUR ) 30 MG 24 hr tablet, Take 30 mg by mouth in the morning., Disp: , Rfl:  .  labetalol  (NORMODYNE ) 200 MG tablet, Take 1 tablet by mouth 3 (three) times a day, Disp: , Rfl:  .  latanoprost  (XALATAN ) 0.005 % ophthalmic solution, Administer 1 drop  into affected eye(s) at bed time, Disp: , Rfl:  .  nateglinide  (STARLIX ) 60 MG tablet, TAKE 1 TABLET BY MOUTH EVERY DAY WITH MEALS, Disp: 90 tablet, Rfl: 3 .  potassium chloride  (KLOR-CON  M10) 10 MEQ CR tablet, Take 10 mEq by mouth every other day, Disp: , Rfl:  .  rivaroxaban  (XARELTO ) 15 MG tablet, Take 15 mg by mouth 1 (one) time each day, Disp: , Rfl:  .  timolol  (TIMOPTIC ) 0.5 % ophthalmic solution, Administer 1 drop into affected eye(s) in the morning and 1 drop before bedtime., Disp: , Rfl:  .  torsemide  (DEMADEX ) 20 MG tablet, Take 20 mg by mouth every other day, Disp: , Rfl:  .  valsartan (DIOVAN) 160 MG tablet, TAKE 1 TABLET BY MOUTH EVERY DAY, Disp: 30 tablet, Rfl: 5      History Medical History      Past Medical History:  Diagnosis Date  . Atrial  fibrillation (HCC)    . Cystic kidney disease        Surgical History  History reviewed. No pertinent surgical history.        Family History  Problem Relation Age of Onset  . Kidney disease Mother    . Diabetes Sibling          sister  . Diabetes Mother    . Diabetes Father    . Hypertension Sibling          sister  . Hypertension Father    . Hypertension Mother    . Stroke Father    . Dementia Mother    . Cancer Child          rare form of Gastrointestinal stomal tumor        Social History         Tobacco Use  . Smoking status: Never  . Smokeless tobacco: Never  Substance Use Topics  . Alcohol use: Not on file      Comment: Alcoholic Drinks/day: Occasional social drink      OBJECTIVE    Vitals     Vitals:    05/03/24 0838  BP: 132/80  Pulse: 60  Resp: 16  Temp: 97.9 F  Weight: 215 lb (97.5 kg)  Height: 6' 1.5 (1.867 m)        Constitutional: He is oriented to person, place, and time. He does not appear ill. No distress.  HEENT:  Nose: Nose normal. Mouth/Throat: Oropharynx is clear and moist. No oropharyngeal exudate.  Eyes: EOM are normal. Pupils are equal, round, and reactive to light. No scleral icterus.  Neck: No JVD present. No thyromegaly present.  Cardiovascular:  Normal rate, regular rhythm and intact distal pulses.           No murmur heard.He exhibits no edema.  Pulmonary/Chest: Effort normal and breath sounds normal. No respiratory distress. He has no wheezes. He has no rales.  Abdominal: Soft. Bowel sounds are normal. He exhibits no distension. There is no abdominal tenderness.  Musculoskeletal: He exhibits no tenderness, deformity or decreased range of motion.  Lymphadenopathy:    He has no cervical adenopathy.  Neurological: He is alert and oriented to person, place, and time.  Skin: Skin is warm and dry. No rash noted. He is not diaphoretic. No erythema. No pallor.  Psychiatric: He has a normal mood and affect. His behavior is  normal. Judgment normal.  - Flat affect      Laboratory Studies  Chemistry  Lab Units 03/26/24 0000 02/21/24 0000 02/13/24 0000 01/31/24 0000 12/28/23 0000 12/21/23 0000 12/06/23 0000 11/29/23 0000 11/24/23 0000 11/23/23 0000 11/22/23 0000 09/18/23 0000 08/02/23 0000 06/20/23 0000 06/06/23 0000 01/31/23 0000 09/27/22 0000 06/14/22 0957 06/03/22 0000  SODIUM   139 140 142 141 140  --  141 138 139 140   < > 142 139 140 140 140 141   < > 141  POTASSIUM   3.9 4.6 4.5 3.7 4.5  --  3.4 3.4 3.6 3.4   < > 4.7 4.0 3.8 4.8 4.8 4.4   < > 3.6  CHLORIDE   104.0 103 105 105.0 104  --  105.0 101.0 106.0 106.0   < > 108 108.0 106.0 109 105 108   < > 105  CO2 mmol/L 25 28 28 18 26   --  27 28 23 23    < > 24 23 25 27 24 24    < > 26  BUN mg/dL 39* 37* 41* 37* 44*  --  34* 45* 38* 36*   < > 36* 39* 36* 43* 39* 42*   < > 43  CREATININE mg/dL 6.85* 6.62* 6.56* 6.92* 3.27*  --  2.99* 3.92* 3.46* 3.26*   < > 2.79* 3.07* 3.01* 3.17* 3.23* 3.15*   < > 3.09  ALBUMIN g/dL  --  4.3  --   --  4.5  --   --   --   --   --   --  4.4 4.3 4.3 4.3 4.5 4.5   < >  --   EGFR mL/min/1.64m2  --  18* 18*  --  19*  --   --   --   --   --   --  23* 19.01 19.48 20* 19* 20*   < >  --   EGFRAFR   18.41  --   --  20.0  --   --  19.57 14.14  --   --   --   --   --   --   --   --   --   --  19.01  EGFRNAFR    --   --   --  20  20  --   --   --   --   --   --   --   --   --   --   --   --   --   --   --   GLUCOSE   144 252* 182* 214* 221*  --  165 224* 283* 176   < > 151* 185 118 189* 196* 134*   < > 137  CALCIUM mg/dL 9.8 9.7 9.5 9.5 9.6  --  9.6 9.4 8.8 8.9   < > 9.2 9.3 9.4 9.5 9.9 9.4   < > 9.3  PHOSPHORUS mg/dL  --  3.7  --   --  3.5  --   --   --   --  3.7  --  3.5 3.7  --  3.5 3.5 4.2   < >  --   MAGNESIUM  mg/dL  --  1.9  --   --  2.0  --   --   --   --  1.9  --  2.0 1.9  --  2.0 2.0 2.0   < >  --   ALK PHOS U/L  --  61  --   --  61  --   --   --   --   --   --  59 58 57 57 58 52   < >  --   PTH  pg/mL  --  195*  --   --  169*  --   --   --   --   --   --  183*  --   --  159* 228* 239*   < >  --   VIT D 25 HYDROXY ng/mL  --  51  --   --  54  --   --   --   --   --   --  46  --   --  47 48 48   < >  --   WBC AUTO 10*3/ML 7.5 6.9  --  8.2 7.6  --   --  7.4 8.9 9.0  --  8.6  --   --  8.5 7.8 8.1   < > 9.1  HEMOGLOBIN A1C   7.1*  --   --   --   --  7.7*  --   --   --   --   --   --   --  7.1* 6.9*  --   --   --  6.6  HEMOGLOBIN   12.1* 11.7*  --  12.0* 12.0*  --   --  11.9* 11.9* 11.9*  --  11.4*  --   --  12.2* 12.2* 11.4*   < > 11.3  HEMATOCRIT   37.5* 37.4*  --  37.8* 38.4*  --   --  36.1* 34.7* 34.4*   < > 35.6*  --   --  37.7* 38.4* 33.9*   < > 35.6  PLATELETS AUTO 10*3/UL 254 254  --  246 196  --   --  305 304 318  --  247  --   --  248 238 242   < > 270   < > = values in this interval not displayed.          Urine  Lab Units 02/13/24 0000 12/28/23 0000 10/01/2023 0000  PROT/CREAT RATIO UR mg/g creat 0.630*  630* 0.623*  623* 0.411*  411*          Lab Results  Component Value Date    COLORU YELLOW 02/13/2024    KETONESU NEGATIVE 02/13/2024    RBCU NONE SEEN 02/13/2024    UROBILINOGEN 0.2 12/24/2021          Iron Studies  Lab Units 02/21/24 0000 Oct 01, 2023 0000 01/31/23 0000  IRON mcg/dL 48* 37* 69  TIBC mcg/dL (calc) 743 730 708  IRON SATURATION % (calc) 19* 14* 24         CBC  Lab Units 03/26/24 0000 02/21/24 0000  WBC AUTO 10*3/ML 7.5 6.9  RBC AUTO Million/uL  --  4.28  HEMOGLOBIN   12.1* 11.7*  HEMATOCRIT   37.5* 37.4*  MCV   84.1 87.4  MCH pg  --  27.3  MCHC g/dL  --  68.6*  RDW %  --  16.4*  PLATELETS AUTO 10*3/UL 254 254  MPV fL  --  11.5  NEUTROS PCT AUTO %  --  62  BM LYMPHOCYTES %  --  23.8  MONO %  --  6.5  BASOS PCT AUTO %  --  0.9  NEUTROS ABS cells/uL  --  4,278  LYMPHS ABS AUTO cells/uL  --  1,642  EOS ABS AUTO cells/uL  --  469  BASOS ABS AUTO cells/uL  --  62  Imaging and Other Studies   06/21/2023: Renal Ultrasound: Right  Kidney:  Renal measurements: 10.6 x 5.5 x 4.8 cm = volume: 143.1 mL. Normal renal cortical thickness. Increased cortical echogenicity. No hydronephrosis. 9 mm stone. There is a 1.9 cm cyst. No imaging follow-up needed.  Left Kidney:  Renal measurements: 10.3 x 7.0 x 4.6 cm = volume: 173.9 mL. Echogenicity within normal limits. No mass or hydronephrosis visualized.  Bladder:  Appears normal for degree of bladder distention.  Other:  None.  IMPRESSION:  1. No hydronephrosis.  2. Increased right renal cortical echogenicity as can be seen with medical renal disease.  3. Right renal stone.   GLENWOOD Bard Moats M.D.    ___   01/12/2021: Renal Ultrasound Right Kidney: Renal measurements: 10.6 x 7.0 x 7.5 cm = volume: 292 mL. Renal cortical echogenicity is normal and cortical thickness has been preserved. Several stable simple cortical and parapelvic cysts are again identified measuring up to 18 mm in greatest dimension. Since the prior examinations, there has developed a 1.1 x 1.8 x 1.5 cm echogenic solid cortical mass within the a upper pole of the right kidney which is indeterminate on this examination. There is an adjacent tiny focus of calcification identified, similar to that noted on prior CT examination of 01/28/2019. An apparent 3.1 cm solid upper pole mass is likely artifactual represents the right renal upper polar cortex images obliquely. Apparent 11 mm calculus within the right renal pelvis is likely artifactual and represents urothelium and prominent renal sinus fat. There is no hydronephrosis.  Left Kidney: Renal measurements: 11.3 x 5.9 x 7.9 cm = volume: 274 mL. Renal cortical echogenicity is normal and cortical thickness has been preserved. No intrarenal masses or calcifications are seen. There is no hydronephrosis.  Bladder: The bladder appears mildly distended and the bladder wall is trabeculated suggesting changes of a chronic bladder outlet obstruction. No intraluminal mass or debris is  identified.    GLENWOOD Dorethia Molt MD  ___       Assessment and Plan:    Problem List Items Addressed This Visit     1.  Stage IV chronic kidney disease. - Recent events noted and reviewed in detail.  Latest available laboratory studies obtained on March 26, 2024, revealed the serum creatinine level to be 3.14 mg/dL with BUN of 39 mg/dL and estimated GFR of 81.5.  The serum creatinine level had previously been in the range of 2.99 and 3.43 mg/dL, in the timeframe of June 2025 to August 2025. As previously noted, he has variable proteinuria.  On February 13, 2024, the random urine protein/creatinine ratio was 0.63/g creatinine.  Very likely, he has diabetic nephropathy.  He continues to be on angiotensin receptor blocker, valsartan and manage dose of 160 mg once a day. Will follow-up renal function studies today.  Will also check urinalysis and random urine protein/creatinine ratio. Reiterated with patient and his wife, that his current renal function is stable and does not prohibit him from having cardiac ablation procedure done. Examined with patient and his wife, continued instances in which he feels weak in the mornings with pain and dizziness.  This may be a function of relatively low blood pressure readings.  In addition he may have relative hypoglycemia as well.  Discussed that he may need to have use of continuous glucose monitor.   2.  Hypertension. - Blood pressure reading today is acceptable.  He is currently on amlodipine  5 mg once a day, hydralazine  100 mg 3  times a day, labetalol  200 mg 3 times a day, torsemide  20 mg every other day and valsartan 160 mg every day.   3.  Type 2 diabetes mellitus. - Latest hemoglobin A1c was 7.1% on March 26, 2024.  He continues to be on insulin  glargine/Lantus  9 units once a day, nateglinide /Starlix  60 mg once a day with meal.   4.  Congestive heart failure. - He is fairly stable today.  Not in overt heart failure/fluid overload on today's  physical examination.  He continues to be on torsemide  at a dose of 20 mg every other day.   5.  Gout. - No recent gout flare.     6.  CKD mineral bone disorder. -Will follow-up parameters today. Previously, on February 21, 2024, the PTH was 195 with serum calcium of 9.7, phosphorus 3.7 and 25-hydroxy vitamin D  of 51.  He is currently on calcitriol  0.25 mcg every other day and vitamin D  2000 units once a day.   7.  Peripheral arterial disease. - Follow-up with vascular surgery.   8.  Anemia. - Stable.  Latest hemoglobin was 12.1 on March 26, 2024.  He continues to be on ferrous sulfate 325 mg 2 times a day.  Latest iron saturation was 19% on February 21, 2024.  Continue to monitor hemoglobin/hematocrit.   9.  Renal cyst/mass. - Latest renal ultrasound in December 2024, did not reveal any masses or concerning lesion.   10.  Hypokalemia. He continues to be on potassium chloride  at a dose of 10 mEq every other day.  Latest serum potassium level was 3.9 on March 26, 2024.  Follow-up level today.   Laboratory studies requested today: CMP, iron profile, magnesium , PTH, phosphorus, urinalysis, random urine protein/creatinine ratio.     Return in about 2 months (around 07/03/2024).     Adeyinka A Adegoroye, MD

## 2024-05-07 ENCOUNTER — Other Ambulatory Visit: Payer: Self-pay | Admitting: Adult Health

## 2024-05-07 ENCOUNTER — Telehealth: Payer: Self-pay | Admitting: Adult Health

## 2024-05-07 NOTE — Progress Notes (Signed)
Labs faxed to PCP and mailed to patient

## 2024-05-07 NOTE — Telephone Encounter (Signed)
 Spouse says they dropped off paperwork for curbside trash pickup back in September and they are checking to see if it's been completed. Patient rquesting a call

## 2024-05-07 NOTE — Telephone Encounter (Signed)
 Copied from CRM #[id number]. Topic: Clinical - Medication Refill >> May 07, 2024  9:36 AM Larissa RAMAN wrote: Medication: ACCU-CHEK GUIDE TEST test strip  Has the patient contacted their pharmacy? Yes (Agent: If no, request that the patient contact the pharmacy for the refill. If patient does not wish to contact the pharmacy document the reason why and proceed with request.) (Agent: If yes, when and what did the pharmacy advise?)  This is the patient's preferred pharmacy:  [id number] [place], [place] - [address] AT [facility] [address] & [name] [address] [place] [address] Phone: [phone] Fax: [phone]    Is this the correct pharmacy for this prescription? Yes If no, delete pharmacy and type the correct one.   Has the prescription been filled recently? No  Is the patient out of the medication? Yes  Has the patient been seen for an appointment in the last year OR does the patient have an upcoming appointment? Yes  Can we respond through MyChart? No  Agent: Please be advised that Rx refills may take up to 3 business days. We ask that you follow-up with your pharmacy.

## 2024-05-11 ENCOUNTER — Other Ambulatory Visit: Payer: Self-pay | Admitting: Adult Health

## 2024-05-12 ENCOUNTER — Other Ambulatory Visit: Payer: Self-pay | Admitting: Adult Health

## 2024-05-15 ENCOUNTER — Telehealth: Payer: Self-pay | Admitting: Adult Health

## 2024-05-15 NOTE — Telephone Encounter (Signed)
 Copied from CRM #[id number]. Topic: General - Other >> May 15, 2024  3:57 PM Terri MATSU wrote: Patient wife is calling again regarding paperwork she dropped off in Sept and wanted to know if Dr.[name] completed. Callback number [phone]

## 2024-05-17 NOTE — Telephone Encounter (Signed)
 Spoke to pt spouse some days ago and advised that I did not see any papers for this. I did mention to her that if there was paperwork filed that I keep it for a month and send to shreds. I reached out to Medical records coordinator and she reached out to scan department. Per coordinator, scan department is behind on scans but they will check and update us  on 05/21/2024.

## 2024-05-22 ENCOUNTER — Telehealth: Payer: Self-pay | Admitting: Adult Health

## 2024-05-22 NOTE — Telephone Encounter (Signed)
 Tried to call pt spouse but no answer. PPW was not found in scans nor on pt chart. Tried calling pt spouse and advising to reach out to the company to see if they have it but no answer.

## 2024-05-22 NOTE — Telephone Encounter (Signed)
 Copied from CRM [id number]. Topic: General - Call Back - No Documentation >> May 22, 2024  1:21 PM Alexandria E wrote: Reason for CRM: Patient's wife, Lucienne, called in stating that she missed a call today 11/19 from the office in regards to her husband. Agent could not locate who might have called her, please call spouse back when available.

## 2024-05-23 NOTE — Telephone Encounter (Signed)
 Left detailed message informing  of update.

## 2024-05-30 ENCOUNTER — Other Ambulatory Visit: Payer: Self-pay | Admitting: Adult Health

## 2024-06-03 ENCOUNTER — Ambulatory Visit (HOSPITAL_COMMUNITY)
Admission: RE | Admit: 2024-06-03 | Discharge: 2024-06-03 | Disposition: A | Source: Ambulatory Visit | Attending: Cardiovascular Disease | Admitting: Cardiovascular Disease

## 2024-06-03 DIAGNOSIS — I5022 Chronic systolic (congestive) heart failure: Secondary | ICD-10-CM | POA: Insufficient documentation

## 2024-06-03 DIAGNOSIS — I4819 Other persistent atrial fibrillation: Secondary | ICD-10-CM | POA: Diagnosis present

## 2024-06-03 DIAGNOSIS — Z79899 Other long term (current) drug therapy: Secondary | ICD-10-CM | POA: Insufficient documentation

## 2024-06-03 LAB — ECHOCARDIOGRAM COMPLETE: S' Lateral: 4.24 cm

## 2024-06-04 ENCOUNTER — Ambulatory Visit: Payer: Self-pay | Admitting: Cardiology

## 2024-06-04 ENCOUNTER — Telehealth: Payer: Self-pay | Admitting: Cardiology

## 2024-06-04 NOTE — Telephone Encounter (Signed)
 Pts wife requesting a c/b with several questions about husbands care and course of treatment. Please advise.

## 2024-06-05 NOTE — Telephone Encounter (Signed)
 Wife Lillis) returned RN's call.

## 2024-06-05 NOTE — Telephone Encounter (Signed)
 Spoke with the patient's wife who states that the patient has been having more episodes lately of feeling funny. She states that he has trouble describing his symptoms to her but she notices that he is very fatigued, sleeping more, and has trouble walking short distances without stopping to rest. She states that he does have some lightheadedness as well. She reports his blood pressure has been low but she is unable to provide me with any readings. She is concerned about his recent echocardiogram results and that his heart function remains moderately reduced. She would like to discuss next steps further. Appointment has been made with APP.

## 2024-06-05 NOTE — Telephone Encounter (Signed)
 Left message for patient's wife to call back.

## 2024-06-06 ENCOUNTER — Encounter: Payer: Self-pay | Admitting: Adult Health

## 2024-06-06 ENCOUNTER — Ambulatory Visit: Admitting: Adult Health

## 2024-06-06 VITALS — BP 110/80 | HR 108 | Temp 99.0°F | Ht 75.0 in | Wt 213.0 lb

## 2024-06-06 DIAGNOSIS — I4821 Permanent atrial fibrillation: Secondary | ICD-10-CM

## 2024-06-06 DIAGNOSIS — R002 Palpitations: Secondary | ICD-10-CM

## 2024-06-06 DIAGNOSIS — R5382 Chronic fatigue, unspecified: Secondary | ICD-10-CM

## 2024-06-06 NOTE — Progress Notes (Signed)
 Subjective:    Patient ID: [name] [name], male    DOB: 1945-07-12, 78 y.o.   MRN: [id number]  HPI  78 year old male who  has a past medical history of Afib (HCC), Anemia, Cataract, CHF (congestive heart failure) (HCC), CKD (chronic kidney disease) stage 4, GFR 15-29 ml/min (HCC), DIABETES MELLITUS, TYPE II (01/02/2007), HYPERLIPIDEMIA (06/18/2007), HYPERTENSION (01/02/2007), RENAL INSUFFICIENCY (02/05/2008), and Stroke (HCC) (2019).  He presents to the office with his wife. He has a history of Afib. He reports that for awhile he has been feeling extra beats but that over the last week this has become more frequent.   He was last seen by Cardiology on 04/16/2024 at which time EKG showed sinus brady. His Amiodarone  dose was decreased to 100 mg to help with side effects he was experiencing. He was continued on Xarelto . He reports that he has been taking his medication as directed. In the past he was scheduled for cardiac ablation but due this health and noted enlarged aorta this was canceled and he has been maintained on medication therapy.   He is feeling more chronically fatigued since palpitations have started.      Review of Systems See HPI   Past Medical History:  Diagnosis Date   Afib (HCC)    Anemia    Cataract    CHF (congestive heart failure) (HCC)    CKD (chronic kidney disease) stage 4, GFR 15-29 ml/min (HCC)    DIABETES MELLITUS, TYPE II 01/02/2007   HYPERLIPIDEMIA 06/18/2007   HYPERTENSION 01/02/2007   RENAL INSUFFICIENCY 02/05/2008   Stroke (HCC) 2019    Social History   Socioeconomic History   Marital status: Married    Spouse name: Not on file   Number of children: Not on file   Years of education: Not on file   Highest education level: Not on file  Occupational History   Not on file  Tobacco Use   Smoking status: Never   Smokeless tobacco: Never  Vaping Use   Vaping status: Never Used  Substance and Sexual Activity   Alcohol use: No   Drug use: No    Sexual activity: Not on file  Other Topics Concern   Not on file  Social History Narrative   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 Likes to go to football games.    Social Drivers of Corporate Investment Banker Strain: Low Risk  (08/25/2023)   Overall Financial Resource Strain (CARDIA)    Difficulty of Paying Living Expenses: Not hard at all  Food Insecurity: No Food Insecurity (12/01/2023)   Hunger Vital Sign    Worried About Running Out of Food in the Last Year: Never true    Ran Out of Food in the Last Year: Never true  Transportation Needs: No Transportation Needs (12/01/2023)   PRAPARE - Administrator, Civil Service (Medical): No    Lack of Transportation (Non-Medical): No  Physical Activity: Inactive (08/25/2023)   Exercise Vital Sign    Days of Exercise per Week: 0 days    Minutes of Exercise per Session: 0 min  Stress: No Stress Concern Present (08/25/2023)   [employer] of Occupational Health - Occupational Stress Questionnaire    Feeling of Stress : Not at all  Social Connections: Socially Integrated (11/21/2023)   Social  Connection and Isolation Panel    Frequency of Communication with Friends and Family: More than three times a week    Frequency of Social Gatherings with Friends and Family: More than three times a week    Attends Religious Services: More than 4 times per year    Active Member of [employer] or Organizations: Yes    Attends Banker Meetings: More than 4 times per year    Marital Status: Married  Catering Manager Violence: Not At Risk (11/21/2023)   Humiliation, Afraid, Rape, and Kick questionnaire    Fear of Current or Ex-Partner: No    Emotionally Abused: No    Physically Abused: No    Sexually Abused: No    Past Surgical History:  Procedure Laterality Date   CARDIOVERSION N/A 11/20/2023   Procedure:  CARDIOVERSION;  Surgeon: Francyne Headland, MD;  Location: [facility] INVASIVE CV [facility];  Service: Cardiovascular;  Laterality: N/A;   LOOP RECORDER INSERTION N/A 07/11/2017   Procedure: LOOP RECORDER INSERTION;  Surgeon: Kelsie Agent, MD;  Location: [facility];  Service: Cardiovascular;  Laterality: N/A;   SHOULDER SURGERY     left   TEE WITHOUT CARDIOVERSION N/A 07/11/2017   Procedure: TRANSESOPHAGEAL ECHOCARDIOGRAM (TEE);  Surgeon: Maranda Leim DEL, MD;  Location: [facility] ENDOSCOPY;  Service: Cardiovascular;  Laterality: N/A;   TRANSURETHRAL RESECTION OF PROSTATE     history of retention/hematuria     Family History  Problem Relation Age of Onset   Hypertension Mother    Diabetes Mother    Stroke Father    Stroke Maternal Grandmother    Colon cancer Neg Hx    Esophageal cancer Neg Hx    Rectal cancer Neg Hx    Stomach cancer Neg Hx     Allergies  Allergen Reactions   Evolocumab  Hypertension   5-Alpha Reductase Inhibitors Other (See Comments)    myalgia    Iodinated Contrast Media     Avoid due to chronic kidney disease    Penicillins Hives   Statins     myalgia    Sulfa Antibiotics Hives, Itching and Swelling   Sulfa Drugs Cross Reactors Hives, Itching and Swelling   Sulfamethoxazole-Trimethoprim Hives, Itching and Swelling    Current Outpatient Medications on File Prior to Visit  Medication Sig Dispense Refill   ACCU-CHEK GUIDE TEST test strip USE TO CHECK BLOOD GLUCOSE IN THE MORNING, AT NOON, AND AT BEDTIME 100 strip 0   Accu-Chek Softclix Lancets lancets USE TO TEST THREE TIMES DAILY 100 each 0   amiodarone  (PACERONE ) 200 MG tablet Take 0.5 tablets (100 mg total) by mouth daily. 45 tablet 3   amLODipine  (NORVASC ) 5 MG tablet Take 5 mg by mouth daily.     B Complex Vitamins (VITAMIN B COMPLEX) TABS Take 1 tablet by mouth every morning.     Blood Glucose Monitoring Suppl (ACCU-CHEK GUIDE) w/Device KIT 3 (three) times daily.     Blood Glucose Monitoring Suppl (ACCU-CHEK  GUIDE) w/Device KIT USE TO TEST THREE TIMES DAILY 1 kit 0   calcitRIOL  (ROCALTROL ) 0.25 MCG capsule Take 0.25 mcg by mouth every other day.     Cholecalciferol  50 MCG (2000 UT) CAPS Take 2,000 Units by mouth daily.     ezetimibe  (ZETIA ) 10 MG tablet TAKE 1 TABLET(10 MG) BY MOUTH DAILY 90 tablet 3   ferrous sulfate 325 (65 FE) MG tablet Take 325 mg by mouth 2 (two) times daily with a meal.     hydrALAZINE  (APRESOLINE ) 100 MG  tablet Take 100 mg by mouth 3 (three) times daily.     insulin  glargine (LANTUS  SOLOSTAR) 100 UNIT/ML Solostar Pen ADMINISTER 9 UNITS UNDER THE SKIN TWICE DAILY 15 mL 0   isosorbide  mononitrate (IMDUR ) 30 MG 24 hr tablet Take 1 tablet (30 mg total) by mouth daily. 90 tablet 3   labetalol  (NORMODYNE ) 200 MG tablet TAKE 1 TABLET(200 MG) BY MOUTH THREE TIMES DAILY 270 tablet 3   latanoprost  (XALATAN ) 0.005 % ophthalmic solution Place 1 drop into both eyes at bedtime.     nateglinide  (STARLIX ) 60 MG tablet Take 60 mg by mouth daily.     potassium chloride  (KLOR-CON  M) 10 MEQ tablet TAKE 1 TABLET BY MOUTH DAILY 90 tablet 1   Rivaroxaban  (XARELTO ) 15 MG TABS tablet TAKE 1 TABLET(15 MG) BY MOUTH DAILY WITH SUPPER 30 tablet 5   timolol  (TIMOPTIC ) 0.5 % ophthalmic solution Place 1 drop into both eyes 2 (two) times daily.     torsemide  (DEMADEX ) 20 MG tablet TAKE 2 TABLETS BY MOUTH DAILY 180 tablet 3   valsartan (DIOVAN) 160 MG tablet Take 80 mg by mouth.     No current facility-administered medications on file prior to visit.    BP 110/80   Pulse (!) 108   Temp 99 F (37.2 C) (Oral)   Ht 6' 3 (1.905 m)   Wt 213 lb (96.6 kg)   SpO2 98%   BMI 26.62 kg/m       Objective:   Physical Exam Vitals and nursing note reviewed.  Constitutional:      Appearance: Normal appearance.  Cardiovascular:     Rate and Rhythm: Normal rate. Rhythm irregularly irregular.     Pulses: Normal pulses.     Heart sounds: Normal heart sounds.  Pulmonary:     Effort: Pulmonary effort is  normal.     Breath sounds: Normal breath sounds.  Musculoskeletal:        General: Normal range of motion.  Skin:    General: Skin is warm and dry.  Neurological:     General: No focal deficit present.     Mental Status: He is alert and oriented to person, place, and time.  Psychiatric:        Mood and Affect: Mood normal.        Behavior: Behavior normal.        Thought Content: Thought content normal.        Judgment: Judgment normal.       Assessment & Plan:  1. Palpitations (Primary)  - EKG 12-Lead- Atrial flutter with variable AV block - rate 101  - Will reach out to Cardiology to see if they want me to increase his Amiodarone  dose until he is seen by Cardiology   2. Chronic fatigue  - EKG 12-Lead  3. Permanent atrial fibrillation (HCC)  - EKG 12-Lead  Darleene Shape, NP

## 2024-06-10 NOTE — Progress Notes (Deleted)
 Cardiology Office Note:  .   Date:  06/10/2024  ID:  Gladis LITTIE [name], DOB 1945-09-23, MRN [id number] PCP: Merna Huxley, NP  [facility] Providers Cardiologist:  Peter Jordan, MD Electrophysiologist:  Fonda Kitty, MD {  History of Present Illness: [name]   GOKUL [name] is a 78 y.o. male w/PMHx of  HTN, HLD DM, CKD, stroke AFib NICM, chronic CHF  DCCV May 2025 > post CV acute CHF Hospitalized Amiodarone  pulsed  Afib ablation Aug 5 cancelled 2/2 HTN ooc  Saw Dr. Jordan 03/08/24, following as well with nephrology with labs stable, and stable BP Reported some palpitations at times, though feeling better. Discussed re-visiting with Dr. Cindie for consideration of ablation Planned to updated his echo  He saw Dr. Cindie 04/16/24, morning back pain bothering him, generally fatigued. Felt to be high ablation risk given his CKD, CM. Seemed to be maintaining SR with amiodarone  and dose reduced to 100mg  daily to see if this helps with side effects Rec echo in SR Planned to transition to Dr. Kitty  Recently wife reported escalating symptoms, fatigue DOE, feeling funny, worried about his echo report  He saw Primary provider 06/06/24, reported an up-tick in palpitations > more fatigued EKG was in a rate controled AFlutter Discussed perhaps resuming increased amiodarone  > reached out to our team. Planned to keep his scheduled appt and revisit amio dose.   Today's visit is scheduled to review echo/symptoms ROS:   *** what were the side effects? *** increase amio > DCCV *** EF same 30-35% *** refer to AHF *** xarelto , dose, bleeding *** volume *** renal function limits   Arrhythmia/AAD hx AFib Feb 2025 Amiodarone  started May 2025  Studies Reviewed: [name]    EKG done today and reviewed by myself:  ***   06/03/24: TTE 1. Left ventricular ejection fraction, by estimation, is 30 to 35%. The  left ventricle has moderately decreased function. The left ventricle   demonstrates global hypokinesis. There is moderate concentric left  ventricular hypertrophy. Left ventricular  diastolic parameters are indeterminate.   2. Right ventricular systolic function is normal. The right ventricular  size is normal. Mildly increased right ventricular wall thickness.   3. Left atrial size was severely dilated.   4. Right atrial size was severely dilated.   5. A small pericardial effusion is present. The pericardial effusion is  circumferential.   6. The mitral valve is normal in structure. No evidence of mitral valve  regurgitation. No evidence of mitral stenosis.   7. The aortic valve is normal in structure. There is moderate thickening  of the aortic valve. Aortic valve regurgitation is not visualized. Aortic  valve sclerosis/calcification is present, without any evidence of aortic  stenosis.   8. Aortic dilatation noted. There is borderline dilatation of the  ascending aorta, measuring 39 mm.   9. The inferior vena cava is normal in size with greater than 50%  respiratory variability, suggesting right atrial pressure of 3 mmHg.   Conclusion(s)/Recommendation(s): Given biventricualr hypertrophy and  thickend aortic valve consider work-up for infiltrative process such as  cardiac amyloid, if clinically indicated.   Nov 21, 2023 echo EF 30 to 35% RV normal Severely dilated left and right atrium Small pericardial effusion  07/05/22: TTE LVEF 45-50%   06/09/22: stress myoview    Findings are consistent with no prior ischemia. The study is intermediate risk.   No ST deviation was noted.   LV perfusion is abnormal. There is no evidence of ischemia. There is  no evidence of infarction. Defect 1: There is a small defect with mild reduction in uptake present in the apical apex location(s) that is fixed. There is normal wall motion in the defect area. Consistent with artifact.   Left ventricular function is abnormal. Global function is mildly reduced. End diastolic  cavity size is moderately enlarged. End systolic cavity size is moderately enlarged.   Prior study not available for comparison.   Small size, mild intensity fixed apical perfusion defect, c/w apical thinning artifact. No reversible ischemia. LVEF 47% with moderately dilated LV and global hypokinesis. This is an intermediate risk study. No prior study for comparison.    Risk Assessment/Calculations:    Physical Exam:   VS:  There were no vitals taken for this visit.   Wt Readings from Last 3 Encounters:  06/06/24 213 lb (96.6 kg)  04/16/24 216 lb (98 kg)  03/26/24 215 lb (97.5 kg)    GEN: Well nourished, well developed in no acute distress NECK: No JVD; No carotid bruits CARDIAC: ***RRR, no murmurs, rubs, gallops RESPIRATORY:  *** CTA b/l without rales, wheezing or rhonchi  ABDOMEN: Soft, non-tender, non-distended EXTREMITIES: *** No edema; No deformity   ASSESSMENT AND PLAN: .    persistent AFib AFlutter (*** typical) CHA2DS2Vasc is 7, on xarelto  ***   NICM Chronic CHF LVEF remains reduced ***  HTN ***  Secondary hypercoagulable state 2/2 AFib     {Are you ordering a CV Procedure (e.g. stress test, cath, DCCV, TEE, etc)?   Press F2        :[id number]}     Dispo: ***  Signed, Charlies Macario Arthur, PA-C

## 2024-06-11 ENCOUNTER — Encounter (INDEPENDENT_AMBULATORY_CARE_PROVIDER_SITE_OTHER): Payer: Medicare Other | Admitting: Ophthalmology

## 2024-06-11 ENCOUNTER — Ambulatory Visit: Admitting: Physician Assistant

## 2024-06-13 ENCOUNTER — Ambulatory Visit: Admitting: Cardiology

## 2024-06-16 NOTE — H&P (View-Only) (Signed)
 Cardiology Office Note:  .   Date:  06/16/2024  ID:  [name] [name], DOB 1946-02-28, MRN [id number] PCP: Merna Huxley, NP  [facility] Providers Cardiologist:  Peter Jordan, MD Electrophysiologist:  Fonda Kitty, MD {  History of Present Illness: [name]   [name] is a 78 y.o. male w/PMHx of  HTN, HLD DM, CKD, stroke AFib NICM, chronic CHF  DCCV May 2025 > post CV acute CHF Hospitalized Amiodarone  pulsed  Afib ablation Aug 5 cancelled 2/2 HTN ooc  Saw Dr. Jordan 03/08/24, following as well with nephrology with labs stable, and stable BP Reported some palpitations at times, though feeling better. Discussed re-visiting with Dr. Cindie for consideration of ablation Planned to updated his echo  He saw Dr. Cindie 04/16/24, morning back pain bothering him, generally fatigued. Felt to be high ablation risk given his CKD, CM. Seemed to be maintaining SR with amiodarone  and dose reduced to 100mg  daily to see if this helps with side effects Rec echo in SR Planned to transition to Dr. Kitty  Recently wife reported escalating symptoms, fatigue DOE, feeling funny, worried about his echo report  He saw Primary provider 06/06/24, reported an up-tick in palpitations > more fatigued EKG was in a rate controled AFlutter Discussed perhaps resuming increased amiodarone  > reached out to our team. Planned to keep his scheduled appt and revisit amio dose.   Today's visit is scheduled to review echo/symptoms ROS:   *** what were the side effects of amio? *** increase amio > DCCV *** EF same 30-35% *** refer to AHF >>  *** xarelto , dose, bleeding *** volume *** renal function limits   Arrhythmia/AAD hx AFib Feb 2025 Amiodarone  started May 2025  Studies Reviewed: [name]    EKG done today and reviewed by myself:  ***   06/03/24: TTE 1. Left ventricular ejection fraction, by estimation, is 30 to 35%. The  left ventricle has moderately decreased function. The left  ventricle  demonstrates global hypokinesis. There is moderate concentric left  ventricular hypertrophy. Left ventricular  diastolic parameters are indeterminate.   2. Right ventricular systolic function is normal. The right ventricular  size is normal. Mildly increased right ventricular wall thickness.   3. Left atrial size was severely dilated.   4. Right atrial size was severely dilated.   5. A small pericardial effusion is present. The pericardial effusion is  circumferential.   6. The mitral valve is normal in structure. No evidence of mitral valve  regurgitation. No evidence of mitral stenosis.   7. The aortic valve is normal in structure. There is moderate thickening  of the aortic valve. Aortic valve regurgitation is not visualized. Aortic  valve sclerosis/calcification is present, without any evidence of aortic  stenosis.   8. Aortic dilatation noted. There is borderline dilatation of the  ascending aorta, measuring 39 mm.   9. The inferior vena cava is normal in size with greater than 50%  respiratory variability, suggesting right atrial pressure of 3 mmHg.   Conclusion(s)/Recommendation(s): Given biventricualr hypertrophy and  thickend aortic valve consider work-up for infiltrative process such as  cardiac amyloid, if clinically indicated.   Nov 21, 2023 echo EF 30 to 35% RV normal Severely dilated left and right atrium Small pericardial effusion  07/05/22: TTE LVEF 45-50%   06/09/22: stress myoview    Findings are consistent with no prior ischemia. The study is intermediate risk.   No ST deviation was noted.   LV perfusion is abnormal. There is no evidence  of ischemia. There is no evidence of infarction. Defect 1: There is a small defect with mild reduction in uptake present in the apical apex location(s) that is fixed. There is normal wall motion in the defect area. Consistent with artifact.   Left ventricular function is abnormal. Global function is mildly reduced. End  diastolic cavity size is moderately enlarged. End systolic cavity size is moderately enlarged.   Prior study not available for comparison.   Small size, mild intensity fixed apical perfusion defect, c/w apical thinning artifact. No reversible ischemia. LVEF 47% with moderately dilated LV and global hypokinesis. This is an intermediate risk study. No prior study for comparison.    Risk Assessment/Calculations:    Physical Exam:   VS:  There were no vitals taken for this visit.   Wt Readings from Last 3 Encounters:  06/06/24 213 lb (96.6 kg)  04/16/24 216 lb (98 kg)  03/26/24 215 lb (97.5 kg)    GEN: Well nourished, well developed in no acute distress NECK: No JVD; No carotid bruits CARDIAC: ***RRR, no murmurs, rubs, gallops RESPIRATORY:  *** CTA b/l without rales, wheezing or rhonchi  ABDOMEN: Soft, non-tender, non-distended EXTREMITIES: *** No edema; No deformity   ASSESSMENT AND PLAN: .    persistent AFib AFlutter (*** typical) CHA2DS2Vasc is 7, on xarelto  ***   NICM Chronic CHF LVEF remains reduced ***  HTN ***  Secondary hypercoagulable state 2/2 AFib     {Are you ordering a CV Procedure (e.g. stress test, cath, DCCV, TEE, etc)?   Press F2        :[id number]}     Dispo: ***  Signed, Charlies Macario Arthur, PA-C  agrees to proceed.    NICM Chronic CHF LVEF remains reduced He does not appear volume OL Diuretics w/Dr. Jordan and his nephrologist May need to consider primary prevention device if EF does not improve  HTN Looks good Home readings reviewed, generally they look really good,  a little lower in AFlutter though no consistent reading SBP <100 Secondary hypercoagulable state 2/2 AFib    Dispo: as scheduled, sooner if neeeded  Signed, Charlies Macario Arthur, PA-C

## 2024-06-16 NOTE — Progress Notes (Unsigned)
 Cardiology Office Note:  .   Date:  06/16/2024  ID:  [name] [name] [name], DOB 1946-02-28, MRN [id number] PCP: [name] Huxley, NP  [facility] Providers Cardiologist:  [name] Jordan, MD Electrophysiologist:  [name] Kitty, MD {  History of Present Illness: [name] [name]   [name] [name] [name] is a 78 y.o. male w/PMHx of  HTN, HLD DM, CKD, stroke AFib NICM, chronic CHF  DCCV May 2025 > post CV acute CHF Hospitalized Amiodarone  pulsed  Afib ablation Aug 5 cancelled 2/2 HTN ooc  Saw Dr. Jordan 03/08/24, following as well with nephrology with labs stable, and stable BP Reported some palpitations at times, though feeling better. Discussed re-visiting with Dr. Cindie for consideration of ablation Planned to updated his echo  He saw Dr. Cindie 04/16/24, morning back pain bothering him, generally fatigued. Felt to be high ablation risk given his CKD, CM. Seemed to be maintaining SR with amiodarone  and dose reduced to 100mg  daily to see if this helps with side effects Rec echo in SR Planned to transition to Dr. Kitty  Recently wife reported escalating symptoms, fatigue DOE, feeling funny, worried about his echo report  He saw Primary provider 06/06/24, reported an up-tick in palpitations > more fatigued EKG was in a rate controled AFlutter Discussed perhaps resuming increased amiodarone  > reached out to our team. Planned to keep his scheduled appt and revisit amio dose.   Today's visit is scheduled to review echo/symptoms ROS:   *** what were the side effects of amio? *** increase amio > DCCV *** EF same 30-35% *** refer to AHF >>  *** xarelto , dose, bleeding *** volume *** renal function limits   Arrhythmia/AAD hx AFib Feb 2025 Amiodarone  started May 2025  Studies Reviewed: [name] [name]    EKG done today and reviewed by myself:  ***   06/03/24: TTE 1. Left ventricular ejection fraction, by estimation, is 30 to 35%. The  left ventricle has moderately decreased function. The left  ventricle  demonstrates global hypokinesis. There is moderate concentric left  ventricular hypertrophy. Left ventricular  diastolic parameters are indeterminate.   2. Right ventricular systolic function is normal. The right ventricular  size is normal. Mildly increased right ventricular wall thickness.   3. Left atrial size was severely dilated.   4. Right atrial size was severely dilated.   5. A small pericardial effusion is present. The pericardial effusion is  circumferential.   6. The mitral valve is normal in structure. No evidence of mitral valve  regurgitation. No evidence of mitral stenosis.   7. The aortic valve is normal in structure. There is moderate thickening  of the aortic valve. Aortic valve regurgitation is not visualized. Aortic  valve sclerosis/calcification is present, without any evidence of aortic  stenosis.   8. Aortic dilatation noted. There is borderline dilatation of the  ascending aorta, measuring 39 mm.   9. The inferior vena cava is normal in size with greater than 50%  respiratory variability, suggesting right atrial pressure of 3 mmHg.   Conclusion(s)/Recommendation(s): Given biventricualr hypertrophy and  thickend aortic valve consider work-up for infiltrative process such as  cardiac amyloid, if clinically indicated.   Nov 21, 2023 echo EF 30 to 35% RV normal Severely dilated left and right atrium Small pericardial effusion  07/05/22: TTE LVEF 45-50%   06/09/22: stress myoview    Findings are consistent with no prior ischemia. The study is intermediate risk.   No ST deviation was noted.   LV perfusion is abnormal. There is no evidence  of ischemia. There is no evidence of infarction. Defect 1: There is a small defect with mild reduction in uptake present in the apical apex location(s) that is fixed. There is normal wall motion in the defect area. Consistent with artifact.   Left ventricular function is abnormal. Global function is mildly reduced. End  diastolic cavity size is moderately enlarged. End systolic cavity size is moderately enlarged.   Prior study not available for comparison.   Small size, mild intensity fixed apical perfusion defect, c/w apical thinning artifact. No reversible ischemia. LVEF 47% with moderately dilated LV and global hypokinesis. This is an intermediate risk study. No prior study for comparison.    Risk Assessment/Calculations:    Physical Exam:   VS:  There were no vitals taken for this visit.   Wt Readings from Last 3 Encounters:  06/06/24 213 lb (96.6 kg)  04/16/24 216 lb (98 kg)  03/26/24 215 lb (97.5 kg)    GEN: Well nourished, well developed in no acute distress NECK: No JVD; No carotid bruits CARDIAC: ***RRR, no murmurs, rubs, gallops RESPIRATORY:  *** CTA b/l without rales, wheezing or rhonchi  ABDOMEN: Soft, non-tender, non-distended EXTREMITIES: *** No edema; No deformity   ASSESSMENT AND PLAN: .    persistent AFib AFlutter (*** typical) CHA2DS2Vasc is 7, on xarelto  ***   NICM Chronic CHF LVEF remains reduced ***  HTN ***  Secondary hypercoagulable state 2/2 AFib     {Are you ordering a CV Procedure (e.g. stress test, cath, DCCV, TEE, etc)?   Press F2        :[id number]}     Dispo: ***  Signed, Charlies Macario Arthur, PA-C

## 2024-06-17 ENCOUNTER — Other Ambulatory Visit: Payer: Self-pay | Admitting: Adult Health

## 2024-06-17 ENCOUNTER — Ambulatory Visit: Attending: Physician Assistant | Admitting: Physician Assistant

## 2024-06-17 VITALS — BP 122/94 | Ht 75.0 in | Wt 217.0 lb

## 2024-06-17 DIAGNOSIS — I483 Typical atrial flutter: Secondary | ICD-10-CM | POA: Diagnosis not present

## 2024-06-17 DIAGNOSIS — I5022 Chronic systolic (congestive) heart failure: Secondary | ICD-10-CM | POA: Diagnosis not present

## 2024-06-17 DIAGNOSIS — I428 Other cardiomyopathies: Secondary | ICD-10-CM | POA: Diagnosis not present

## 2024-06-17 DIAGNOSIS — I10 Essential (primary) hypertension: Secondary | ICD-10-CM

## 2024-06-17 DIAGNOSIS — I4819 Other persistent atrial fibrillation: Secondary | ICD-10-CM | POA: Diagnosis not present

## 2024-06-17 MED ORDER — AMIODARONE HCL 200 MG PO TABS: 200.0000 mg | ORAL_TABLET | Freq: Two times a day (BID) | ORAL | 3 refills | Status: AC

## 2024-06-17 MED ORDER — POTASSIUM CHLORIDE CRYS ER 10 MEQ PO TBCR: 10.0000 meq | EXTENDED_RELEASE_TABLET | ORAL | 0 refills | Status: AC

## 2024-06-17 MED ORDER — TORSEMIDE 20 MG PO TABS: 40.0000 mg | ORAL_TABLET | ORAL | Status: AC

## 2024-06-17 NOTE — Patient Instructions (Addendum)
 Medication Instructions:  START TAKING :   AMIODARONE   200 MG TWICE  A  DAY     *If you need a refill on your cardiac medications before your next appointment, please call your pharmacy*  Lab Work:   PLEASE GO DOWN STAIRS  [facility]  FIRST FLOOR   ( GET OFF ELEVATORS WALK TOWARDS WAITING AREA LAB LOCATED BY PHARMACY):  CMET, CBC, & THYROID  PANEL    If you have labs (blood work) drawn today and your tests are completely normal, you will receive your results only by: MyChart Message (if you have MyChart) OR A paper copy in the mail If you have any lab test that is abnormal or we need to change your treatment, we will call you to review the results.  Testing/Procedures:   Your physician has recommended that you have a Cardioversion (DCCV). Electrical Cardioversion uses a jolt of electricity to your heart either through paddles or wired patches attached to your chest. This is a controlled, usually prescheduled, procedure. Defibrillation is done under light anesthesia in the hospital, and you usually go home the day of the procedure. This is done to get your heart back into a normal rhythm. You are not awake for the procedure. Please see the instruction sheet given to you today.  See information attached   Follow-Up: At [facility], you and your health needs are our priority.  As part of our continuing mission to provide you with exceptional heart care, our providers are all part of one team.  This team includes your primary Cardiologist (physician) and Advanced Practice Providers or APPs (Physician Assistants and Nurse Practitioners) who all work together to provide you with the care you need, when you need it.  Your next appointment:   AS SCHEDULED  WITH  DR. PARKER     We recommend signing up for the patient portal called MyChart.  Sign up information is provided on this After Visit Summary.  MyChart is used to connect with patients for Virtual Visits (Telemedicine).  Patients are able  to view lab/test results, encounter notes, upcoming appointments, etc.  Non-urgent messages can be sent to your provider as well.   To learn more about what you can do with MyChart, go to [email or web address].   Other Instructions     Dear [name] [name]   You are scheduled for a Cardioversion on Wednesday, December 17 with Dr. Sheena.  Please arrive at the [facility] (Main Entrance A) at [facility]: [address] [place], [place] [address] at 10:30 AM (This time is 1 hour(s) before your procedure to ensure your preparation). Your procedure is scheduled to begin at 11:30 AM.  Free valet parking service is available. You will check in at ADMITTING.   *Please Note: You will receive a call the day before your procedure to confirm the appointment time. That time may have changed from the original time based on the schedule for that day.*   DIET:  Nothing to eat or drink after midnight except a sip of water with medications (see medication instructions below)  MEDICATION INSTRUCTIONS: !!IF ANY NEW MEDICATIONS ARE STARTED AFTER TODAY, PLEASE NOTIFY YOUR PROVIDER AS SOON AS POSSIBLE!!  FYI: Medications such as Semaglutide (Ozempic, Wegovy), Tirzepatide (Mounjaro, Zepbound), Dulaglutide (Trulicity), etc (GLP1 agonists) AND Canagliflozin (Invokana), Dapagliflozin (Farxiga), Empagliflozin (Jardiance), Ertugliflozin (Steglatro), Bexagliflozin [employer]) or any combination with one of these drugs such as Invokamet (Canagliflozin/Metformin), Synjardy (Empagliflozin/Metformin), etc (SGLT2 inhibitors) must be held around the time of  a procedure. This is not a comprehensive list of all of these drugs. Please review all of your medications and talk to your provider if you take any one of these. If you are not sure, ask your provider.   Continue taking your anticoagulant (blood thinner): Rivaroxaban  (Xarelto ).  You will need to continue this after your procedure until you are told by your  provider that it is safe to stop.    DO NOT take insulin  the morning of your procedure due to you not being able to have anything to eat the morning of.  HOLD Torsemide  the morning of your procedure. You may resume once procedure has been completed.  LABS:  Come to the lab at the [facility] ([address], [place], 1st Floor) between the hours of 8:00 am and 4:30 pm. You do NOT have to be fasting.  FYI:  For your safety, and to allow us  to monitor your vital signs accurately during the surgery/procedure we request: If you have artificial nails, gel coating, SNS etc, please have those removed prior to your surgery/procedure. Not having the nail coverings /polish removed may result in cancellation or delay of your surgery/procedure.  Your support person will be asked to wait in the waiting room during your procedure.  It is OK to have someone drop you off and come back when you are ready to be discharged.  You cannot drive after the procedure and will need someone to drive you home.  Bring your insurance cards.  *Special Note: Every effort is made to have your procedure done on time. Occasionally there are emergencies that occur at the hospital that may cause delays. Please be patient if a delay does occur.

## 2024-06-18 ENCOUNTER — Ambulatory Visit: Admitting: Adult Health

## 2024-06-18 ENCOUNTER — Encounter: Payer: Self-pay | Admitting: Adult Health

## 2024-06-18 ENCOUNTER — Ambulatory Visit: Payer: Self-pay | Admitting: Physician Assistant

## 2024-06-18 VITALS — BP 126/88 | HR 115 | Temp 98.7°F | Ht 75.0 in | Wt 219.0 lb

## 2024-06-18 DIAGNOSIS — N184 Chronic kidney disease, stage 4 (severe): Secondary | ICD-10-CM

## 2024-06-18 DIAGNOSIS — I10 Essential (primary) hypertension: Secondary | ICD-10-CM

## 2024-06-18 DIAGNOSIS — Z794 Long term (current) use of insulin: Secondary | ICD-10-CM | POA: Diagnosis not present

## 2024-06-18 DIAGNOSIS — E1122 Type 2 diabetes mellitus with diabetic chronic kidney disease: Secondary | ICD-10-CM

## 2024-06-18 LAB — CBC
Hematocrit: 34.8 % — ABNORMAL LOW (ref 37.5–51.0)
Hemoglobin: 11.3 g/dL — ABNORMAL LOW (ref 13.0–17.7)
MCH: 28.6 pg (ref 26.6–33.0)
MCHC: 32.5 g/dL (ref 31.5–35.7)
MCV: 88 fL (ref 79–97)
Platelets: 230 x10E3/uL (ref 150–450)
RBC: 3.95 x10E6/uL — ABNORMAL LOW (ref 4.14–5.80)
RDW: 15.1 % (ref 11.6–15.4)
WBC: 7.2 x10E3/uL (ref 3.4–10.8)

## 2024-06-18 LAB — THYROID PANEL WITH TSH
Free Thyroxine Index: 2.4 (ref 1.2–4.9)
T3 Uptake Ratio: 29 % (ref 24–39)
T4, Total: 8.4 ug/dL (ref 4.5–12.0)
TSH: 2.1 u[IU]/mL (ref 0.450–4.500)

## 2024-06-18 LAB — COMPREHENSIVE METABOLIC PANEL WITH GFR
ALT: 8 IU/L (ref 0–44)
AST: 10 IU/L (ref 0–40)
Albumin: 4.2 g/dL (ref 3.8–4.8)
Alkaline Phosphatase: 77 IU/L (ref 47–123)
BUN/Creatinine Ratio: 13 (ref 10–24)
BUN: 37 mg/dL — ABNORMAL HIGH (ref 8–27)
Bilirubin Total: 0.3 mg/dL (ref 0.0–1.2)
CO2: 22 mmol/L (ref 20–29)
Calcium: 9.1 mg/dL (ref 8.6–10.2)
Chloride: 105 mmol/L (ref 96–106)
Creatinine, Ser: 2.89 mg/dL — ABNORMAL HIGH (ref 0.76–1.27)
Globulin, Total: 2.5 g/dL (ref 1.5–4.5)
Glucose: 226 mg/dL — ABNORMAL HIGH (ref 70–99)
Potassium: 4.1 mmol/L (ref 3.5–5.2)
Sodium: 140 mmol/L (ref 134–144)
Total Protein: 6.7 g/dL (ref 6.0–8.5)
eGFR: 22 mL/min/1.73 — ABNORMAL LOW (ref >59)

## 2024-06-18 LAB — POCT GLYCOSYLATED HEMOGLOBIN (HGB A1C): Hemoglobin A1C: 7.3 % — AB (ref 4.0–5.6)

## 2024-06-18 NOTE — Patient Instructions (Signed)
 Health Maintenance Due  Topic Date Due   Zoster Vaccines- Shingrix (1 of 2) Never done   DTaP/Tdap/Td (2 - Tdap) 11/14/2019   COVID-19 Vaccine (5 - 2025-26 season) 03/04/2024       08/25/2023    3:11 PM 09/16/2022    4:08 PM 08/17/2022   10:06 AM  Depression screen PHQ 2/9  Decreased Interest 0 0 0  Down, Depressed, Hopeless 0 0 0  PHQ - 2 Score 0 0 0  Altered sleeping  3   Tired, decreased energy  1   Change in appetite  0   Feeling bad or failure about yourself   0   Trouble concentrating  0   Moving slowly or fidgety/restless  0   Suicidal thoughts  0   PHQ-9 Score  4    Difficult doing work/chores  Not difficult at all      Data saved with a previous flowsheet row definition

## 2024-06-18 NOTE — Progress Notes (Signed)
 Subjective:    Patient ID: [name] [name], male    DOB: October 26, 1945, 78 y.o.   MRN: [id number]  Diabetes   78 year old male who  has a past medical history of Afib (HCC), Anemia, Cataract, CHF (congestive heart failure) (HCC), CKD (chronic kidney disease) stage 4, GFR 15-29 ml/min (HCC), DIABETES MELLITUS, TYPE II (01/02/2007), HYPERLIPIDEMIA (06/18/2007), HYPERTENSION (01/02/2007), RENAL INSUFFICIENCY (02/05/2008), and Stroke (HCC) (2019).  He presents to the office today for follow up regarding DM and HTN  Diabetes mellitus type 2-managed with Starlix  60 mg daily and Lantus  9 units in the morning. His wife reports that there are times when his blood sugar are in the 180 but mostly his blood sugars in the 120 range. He was recently in the hospital and was noted to have elevated blood sugars.  Lab Results  Component Value Date   HGBA1C 7.3 (A) 06/18/2024   HGBA1C 7.1 (A) 03/26/2024   HGBA1C 7.7 (A) 12/21/2023   Hypertension with chronic kidney disease stage IV-managed by nephrology. He is currently on amlodipine  5 mg once a day, hydralazine  100 mg 3 times a day, labetalol  200 mg 3 times a day, torsemide  20 mg every other day, valsartan 80 mg 2 times a day. He does check his blood pressure at home with readings and they report readings mostly in the 120 -140 systolic but have dropped down to 109 systolic. He has not taken his blood pressure medication this morning.  BP Readings from Last 3 Encounters:  06/18/24 126/88  06/17/24 (!) 122/94  06/06/24 110/80     Review of Systems See HPI   Past Medical History:  Diagnosis Date   Afib (HCC)    Anemia    Cataract    CHF (congestive heart failure) (HCC)    CKD (chronic kidney disease) stage 4, GFR 15-29 ml/min (HCC)    DIABETES MELLITUS, TYPE II 01/02/2007   HYPERLIPIDEMIA 06/18/2007   HYPERTENSION 01/02/2007   RENAL INSUFFICIENCY 02/05/2008   Stroke (HCC) 2019    Social History   Socioeconomic History   Marital status:  Married    Spouse name: Not on file   Number of children: Not on file   Years of education: Not on file   Highest education level: Not on file  Occupational History   Not on file  Tobacco Use   Smoking status: Never   Smokeless tobacco: Never  Vaping Use   Vaping status: Never Used  Substance and Sexual Activity   Alcohol use: No   Drug use: No   Sexual activity: Not on file  Other Topics Concern   Not on file  Social History Narrative   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 Likes to go to football games.    Social Drivers of Health   Tobacco Use: Low Risk (06/18/2024)   Patient History    Smoking Tobacco Use: Never    Smokeless Tobacco Use: Never    Passive Exposure: Not on file  Financial Resource Strain: Low Risk (08/25/2023)   Overall Financial Resource Strain (CARDIA)    Difficulty of Paying Living Expenses: Not hard at all  Food Insecurity: No Food Insecurity (12/01/2023)   Hunger Vital Sign    Worried About Running Out of Food in the Last Year: Never true    Ran Out of Food  in the Last Year: Never true  Transportation Needs: No Transportation Needs (12/01/2023)   PRAPARE - Administrator, Civil Service (Medical): No    Lack of Transportation (Non-Medical): No  Physical Activity: Inactive (08/25/2023)   Exercise Vital Sign    Days of Exercise per Week: 0 days    Minutes of Exercise per Session: 0 min  Stress: No Stress Concern Present (08/25/2023)   [employer] of Occupational Health - Occupational Stress Questionnaire    Feeling of Stress : Not at all  Social Connections: Socially Integrated (11/21/2023)   Social Connection and Isolation Panel    Frequency of Communication with Friends and Family: More than three times a week    Frequency of Social Gatherings with Friends and Family: More than three times a week    Attends Religious  Services: More than 4 times per year    Active Member of Clubs or Organizations: Yes    Attends Banker Meetings: More than 4 times per year    Marital Status: Married  Catering Manager Violence: Not At Risk (11/21/2023)   Humiliation, Afraid, Rape, and Kick questionnaire    Fear of Current or Ex-Partner: No    Emotionally Abused: No    Physically Abused: No    Sexually Abused: No  Depression (PHQ2-9): Low Risk (08/25/2023)   Depression (PHQ2-9)    PHQ-2 Score: 0  Alcohol Screen: Low Risk (08/25/2023)   Alcohol Screen    Last Alcohol Screening Score (AUDIT): 0  Housing: Low Risk (12/01/2023)   Housing Stability Vital Sign    Unable to Pay for Housing in the Last Year: No    Number of Times Moved in the Last Year: 0    Homeless in the Last Year: No  Utilities: Not At Risk (12/01/2023)   AHC Utilities    Threatened with loss of utilities: No  Health Literacy: Adequate Health Literacy (08/25/2023)   B1300 Health Literacy    Frequency of need for help with medical instructions: Never    Past Surgical History:  Procedure Laterality Date   CARDIOVERSION N/A 11/20/2023   Procedure: CARDIOVERSION;  Surgeon: [name] Headland, MD;  Location: [facility] INVASIVE CV LAB;  Service: Cardiovascular;  Laterality: N/A;   LOOP RECORDER INSERTION N/A 07/11/2017   Procedure: LOOP RECORDER INSERTION;  Surgeon: [name] Agent, MD;  Location: [facility] INVASIVE CV LAB;  Service: Cardiovascular;  Laterality: N/A;   SHOULDER SURGERY     left   TEE WITHOUT CARDIOVERSION N/A 07/11/2017   Procedure: TRANSESOPHAGEAL ECHOCARDIOGRAM (TEE);  Surgeon: [name] Leim DEL, MD;  Location: [facility] ENDOSCOPY;  Service: Cardiovascular;  Laterality: N/A;   TRANSURETHRAL RESECTION OF PROSTATE     history of retention/hematuria     Family History  Problem Relation Age of Onset   Hypertension Mother    Diabetes Mother    Stroke Father    Stroke Maternal Grandmother    Colon cancer Neg Hx    Esophageal cancer Neg Hx    Rectal  cancer Neg Hx    Stomach cancer Neg Hx     Allergies[1]  Medications Ordered Prior to Encounter[2]  BP 126/88   Pulse (!) 115   Temp 98.7 F (37.1 C) (Oral)   Ht 6' 3 (1.905 m)   Wt 219 lb (99.3 kg)   SpO2 98%   BMI 27.37 kg/m       Objective:   Physical Exam Vitals and nursing note reviewed.  Constitutional:      Appearance: Normal  appearance.  Cardiovascular:     Rate and Rhythm: Normal rate. Rhythm irregularly irregular.     Pulses: Normal pulses.     Heart sounds: Normal heart sounds.  Pulmonary:     Effort: Pulmonary effort is normal.     Breath sounds: Normal breath sounds.  Skin:    General: Skin is warm and dry.  Neurological:     General: No focal deficit present.     Mental Status: He is alert and oriented to person, place, and time.  Psychiatric:        Mood and Affect: Mood normal.        Behavior: Behavior normal.        Thought Content: Thought content normal.        Judgment: Judgment normal.        Assessment & Plan:  1. Type 2 diabetes mellitus with stage 4 chronic kidney disease, with long-term current use of insulin  (HCC) (Primary)  - POC HgB A1c- 7.3 - has increased slightly but at goal for age.  - Continue with current therapy  - Follow up in 3 months   2. Essential hypertension - Well controlled. No change   Darleene Shape, NP       [1]  Allergies Allergen Reactions   Evolocumab  Hypertension   5-Alpha Reductase Inhibitors Other (See Comments)    myalgia    Iodinated Contrast Media     Avoid due to chronic kidney disease    Penicillins Hives   Statins     myalgia    Sulfa Antibiotics Hives, Itching and Swelling   Sulfa Drugs Cross Reactors Hives, Itching and Swelling   Sulfamethoxazole-Trimethoprim Hives, Itching and Swelling  [2]  Current Outpatient Medications on File Prior to Visit  Medication Sig Dispense Refill   ACCU-CHEK GUIDE TEST test strip USE TO CHECK BLOOD GLUCOSE IN THE MORNING, AT NOON, AND AT BEDTIME  100 strip 0   Accu-Chek Softclix Lancets lancets USE TO TEST THREE TIMES DAILY 100 each 0   amiodarone  (PACERONE ) 200 MG tablet Take 1 tablet (200 mg total) by mouth 2 (two) times daily. 180 tablet 3   amLODipine  (NORVASC ) 5 MG tablet Take 5 mg by mouth daily.     B Complex Vitamins (VITAMIN B COMPLEX) TABS Take 1 tablet by mouth every morning.     Blood Glucose Monitoring Suppl (ACCU-CHEK GUIDE) w/Device KIT 3 (three) times daily.     Blood Glucose Monitoring Suppl (ACCU-CHEK GUIDE) w/Device KIT USE TO TEST THREE TIMES DAILY 1 kit 0   calcitRIOL  (ROCALTROL ) 0.25 MCG capsule Take 0.25 mcg by mouth every other day.     Cholecalciferol  50 MCG (2000 UT) CAPS Take 2,000 Units by mouth daily.     ezetimibe  (ZETIA ) 10 MG tablet TAKE 1 TABLET(10 MG) BY MOUTH DAILY 90 tablet 3   ferrous sulfate 325 (65 FE) MG tablet Take 325 mg by mouth 2 (two) times daily with a meal.     hydrALAZINE  (APRESOLINE ) 100 MG tablet Take 100 mg by mouth 3 (three) times daily.     insulin  glargine (LANTUS  SOLOSTAR) 100 UNIT/ML Solostar Pen ADMINISTER 9 UNITS UNDER THE SKIN TWICE DAILY 15 mL 0   isosorbide  mononitrate (IMDUR ) 30 MG 24 hr tablet Take 1 tablet (30 mg total) by mouth daily. 90 tablet 3   labetalol  (NORMODYNE ) 200 MG tablet TAKE 1 TABLET(200 MG) BY MOUTH THREE TIMES DAILY 270 tablet 3   latanoprost  (XALATAN ) 0.005 % ophthalmic solution Place 1 drop into  both eyes at bedtime.     nateglinide  (STARLIX ) 60 MG tablet Take 60 mg by mouth daily.     potassium chloride  (KLOR-CON  M) 10 MEQ tablet Take 1 tablet (10 mEq total) by mouth every other day. 90 tablet 0   Rivaroxaban  (XARELTO ) 15 MG TABS tablet TAKE 1 TABLET(15 MG) BY MOUTH DAILY WITH SUPPER 30 tablet 5   timolol  (TIMOPTIC ) 0.5 % ophthalmic solution Place 1 drop into both eyes 2 (two) times daily.     torsemide  (DEMADEX ) 20 MG tablet Take 2 tablets (40 mg total) by mouth every other day.     valsartan (DIOVAN) 160 MG tablet Take 80 mg by mouth.     No current  facility-administered medications on file prior to visit.

## 2024-06-19 ENCOUNTER — Ambulatory Visit (HOSPITAL_COMMUNITY)
Admission: RE | Admit: 2024-06-19 | Discharge: 2024-06-19 | Disposition: A | Discharge status: Home / Self Care | Attending: Cardiology | Admitting: Cardiology

## 2024-06-19 ENCOUNTER — Encounter (HOSPITAL_COMMUNITY): Admission: RE | Disposition: A | Payer: Self-pay | Source: Home / Self Care | Attending: Cardiology

## 2024-06-19 ENCOUNTER — Ambulatory Visit (HOSPITAL_COMMUNITY): Admitting: Anesthesiology

## 2024-06-19 ENCOUNTER — Encounter (INDEPENDENT_AMBULATORY_CARE_PROVIDER_SITE_OTHER): Admitting: Ophthalmology

## 2024-06-19 ENCOUNTER — Other Ambulatory Visit: Payer: Self-pay

## 2024-06-19 ENCOUNTER — Encounter (HOSPITAL_COMMUNITY): Payer: Self-pay | Admitting: Cardiology

## 2024-06-19 DIAGNOSIS — Z8673 Personal history of transient ischemic attack (TIA), and cerebral infarction without residual deficits: Secondary | ICD-10-CM | POA: Insufficient documentation

## 2024-06-19 DIAGNOSIS — I4891 Unspecified atrial fibrillation: Secondary | ICD-10-CM

## 2024-06-19 DIAGNOSIS — I428 Other cardiomyopathies: Secondary | ICD-10-CM | POA: Insufficient documentation

## 2024-06-19 DIAGNOSIS — N184 Chronic kidney disease, stage 4 (severe): Secondary | ICD-10-CM

## 2024-06-19 DIAGNOSIS — I509 Heart failure, unspecified: Secondary | ICD-10-CM | POA: Diagnosis not present

## 2024-06-19 DIAGNOSIS — I4819 Other persistent atrial fibrillation: Secondary | ICD-10-CM | POA: Insufficient documentation

## 2024-06-19 DIAGNOSIS — E785 Hyperlipidemia, unspecified: Secondary | ICD-10-CM | POA: Insufficient documentation

## 2024-06-19 DIAGNOSIS — I129 Hypertensive chronic kidney disease with stage 1 through stage 4 chronic kidney disease, or unspecified chronic kidney disease: Secondary | ICD-10-CM

## 2024-06-19 DIAGNOSIS — N189 Chronic kidney disease, unspecified: Secondary | ICD-10-CM | POA: Insufficient documentation

## 2024-06-19 DIAGNOSIS — E1122 Type 2 diabetes mellitus with diabetic chronic kidney disease: Secondary | ICD-10-CM | POA: Diagnosis not present

## 2024-06-19 DIAGNOSIS — D6869 Other thrombophilia: Secondary | ICD-10-CM | POA: Diagnosis not present

## 2024-06-19 DIAGNOSIS — Z79899 Other long term (current) drug therapy: Secondary | ICD-10-CM | POA: Diagnosis not present

## 2024-06-19 DIAGNOSIS — I4892 Unspecified atrial flutter: Secondary | ICD-10-CM | POA: Diagnosis not present

## 2024-06-19 DIAGNOSIS — I130 Hypertensive heart and chronic kidney disease with heart failure and stage 1 through stage 4 chronic kidney disease, or unspecified chronic kidney disease: Secondary | ICD-10-CM | POA: Diagnosis not present

## 2024-06-19 HISTORY — PX: CARDIOVERSION: EP1203

## 2024-06-19 MED ORDER — SODIUM CHLORIDE 0.9 % IV SOLN: INTRAVENOUS | Status: DC

## 2024-06-19 MED ORDER — LIDOCAINE 2% (20 MG/ML) 5 ML SYRINGE
INTRAMUSCULAR | Status: DC | PRN
  Administered 2024-06-19: 13:00:00 60 mg via INTRAVENOUS

## 2024-06-19 MED ADMIN — PROPOFOL 200 MG/20ML IV EMUL: 60 mg | INTRAVENOUS | @ 13:00:00 | NDC 00069020910

## 2024-06-19 NOTE — CV Procedure (Signed)
° °  Electrical Cardioversion Procedure Note [name] [name] [id number] [birth year]  Procedure: Electrical Cardioversion Indications:  Atrial Fibrillation  Time Out: Verified patient identification, verified procedure,medications/allergies/relevent history reviewed, required imaging and test results available.  Performed  Procedure Details  The patient signed informed consent.   The patient was NPO past midnight. Has had therapeutic anticoagulation with Xeralto greater than 3 weeks. The patient denies any interruption of anticoagulation.  Anesthesia was administered by Dr. Mcdonald.  Adequate airway was maintained throughout and vital followed per protocol.  He was cardioverted x 1 with 200J of biphasic synchronized energy.  He converted to NSR.  There were no apparent complications.  The patient tolerated the procedure well and had normal neuro status and respiratory status post procedure with vitals stable as recorded elsewhere.     IMPRESSION:  Successful cardioversion of atrial fibrillation   Follow up:  We will arrange follow up with Primary cardiologist.  He will continue on current medical therapy.  The patient advised to continue anticoagulation.  [name] [name] 06/19/2024, 1:27 PM

## 2024-06-19 NOTE — Transfer of Care (Signed)
 Immediate Anesthesia Transfer of Care Note  Patient: [name] [name]  Procedure(s) Performed: CARDIOVERSION  Patient Location: PACU  Anesthesia Type:General  Level of Consciousness: awake  Airway & Oxygen Therapy: Patient Spontanous Breathing and Patient connected to nasal cannula oxygen  Post-op Assessment: Report given to RN and Post -op Vital signs reviewed and stable  Post vital signs: Reviewed and stable  Last Vitals:  Vitals Value Taken Time  BP    Temp    Pulse    Resp    SpO2      Last Pain:  Vitals:   06/19/24 1128  TempSrc:   PainSc: 0-No pain         Complications: No notable events documented.

## 2024-06-19 NOTE — Interval H&P Note (Signed)
 History and Physical Interval Note:  06/19/2024 12:48 PM  [name] [name]  has presented today for surgery, with the diagnosis of AFIB.  The various methods of treatment have been discussed with the patient and family. After consideration of risks, benefits and other options for treatment, the patient has consented to  Procedures: CARDIOVERSION (N/A) as a surgical intervention.  The patient's history has been reviewed, patient examined, no change in status, stable for surgery.  I have reviewed the patient's chart and labs.  Questions were answered to the patient's satisfaction.     [name]

## 2024-06-19 NOTE — Discharge Instructions (Signed)

## 2024-06-19 NOTE — Anesthesia Postprocedure Evaluation (Signed)
 Anesthesia Post Note  Patient: [name] [name]  Procedure(s) Performed: CARDIOVERSION     Patient location during evaluation: PACU Anesthesia Type: General Level of consciousness: awake and alert Pain management: pain level controlled Vital Signs Assessment: post-procedure vital signs reviewed and stable Respiratory status: spontaneous breathing, nonlabored ventilation, respiratory function stable and patient connected to nasal cannula oxygen Cardiovascular status: blood pressure returned to baseline and stable Postop Assessment: no apparent nausea or vomiting Anesthetic complications: no   No notable events documented.  Last Vitals:  Vitals:   06/19/24 1335 06/19/24 1345  BP: (!) 146/91 (!) 146/90  Pulse: 82 67  Resp: 20 17  Temp:    SpO2: 97% 97%    Last Pain:  Vitals:   06/19/24 1345  TempSrc:   PainSc: 0-No pain                 Cordella P Jacy Brocker

## 2024-06-19 NOTE — Anesthesia Preprocedure Evaluation (Addendum)
 Anesthesia Evaluation  Patient identified by MRN, date of birth, ID band Patient awake    Reviewed: Allergy & Precautions, NPO status , Patient's Chart, lab work & pertinent test results  Airway Mallampati: II  TM Distance: >3 FB Neck ROM: Full    Dental no notable dental hx.    Pulmonary neg pulmonary ROS   Pulmonary exam normal        Cardiovascular hypertension, Pt. on medications +CHF  + dysrhythmias Atrial Fibrillation  Rhythm:Irregular Rate:Normal  ECHO 12/25: IMPRESSIONS   1. Left ventricular ejection fraction, by estimation, is 30 to 35%. The left ventricle has moderately decreased function. The left ventricle demonstrates global hypokinesis. There is moderate concentric left ventricular hypertrophy. Left ventricular diastolic parameters are indeterminate.  2. Right ventricular systolic function is normal. The right ventricular size is normal. Mildly increased right ventricular wall thickness.  3. Left atrial size was severely dilated.  4. Right atrial size was severely dilated.  5. A small pericardial effusion is present. The pericardial effusion is circumferential.  6. The mitral valve is normal in structure. No evidence of mitral valve regurgitation. No evidence of mitral stenosis.  7. The aortic valve is normal in structure. There is moderate thickening of the aortic valve. Aortic valve regurgitation is not visualized. Aortic valve sclerosis/calcification is present, without any evidence of aortic stenosis.  8. Aortic dilatation noted. There is borderline dilatation of the ascending aorta, measuring 39 mm.  9. The inferior vena cava is normal in size with greater than 50% respiratory variability, suggesting right atrial pressure of 3 mmHg.   Conclusion(s)/Recommendation(s): Given biventricualr hypertrophy and thickend aortic valve consider work-up for infiltrative process such as cardiac amyloid, if clinically  indicated.    Neuro/Psych CVA  negative psych ROS   GI/Hepatic negative GI ROS,,,  Endo/Other  diabetes, Type 2, Insulin  Dependent    Renal/GU CRFRenal disease  negative genitourinary   Musculoskeletal negative musculoskeletal ROS (+)    Abdominal Normal abdominal exam  (+)   Peds  Hematology  (+) Blood dyscrasia, anemia   Anesthesia Other Findings   Reproductive/Obstetrics                              Anesthesia Physical Anesthesia Plan  ASA: 4  Anesthesia Plan: General   Post-op Pain Management:    Induction: Intravenous  PONV Risk Score and Plan: 2 and Treatment may vary due to age or medical condition  Airway Management Planned: Mask  Additional Equipment: None  Intra-op Plan:   Post-operative Plan:   Informed Consent: I have reviewed the patients History and Physical, chart, labs and discussed the procedure including the risks, benefits and alternatives for the proposed anesthesia with the patient or authorized representative who has indicated his/her understanding and acceptance.     Dental advisory given  Plan Discussed with: CRNA  Anesthesia Plan Comments:          Anesthesia Quick Evaluation

## 2024-06-28 ENCOUNTER — Other Ambulatory Visit: Payer: Self-pay | Admitting: Adult Health

## 2024-07-01 ENCOUNTER — Other Ambulatory Visit: Payer: Self-pay | Admitting: Adult Health

## 2024-07-01 NOTE — Telephone Encounter (Unsigned)
 Copied from CRM #[id number]. Topic: Clinical - Medication Refill >> Jul 01, 2024 10:18 AM Larissa S wrote: Medication: ACCU-CHEK GUIDE TEST test strip  Has the patient contacted their pharmacy? Yes (Agent: If no, request that the patient contact the pharmacy for the refill. If patient does not wish to contact the pharmacy document the reason why and proceed with request.) (Agent: If yes, when and what did the pharmacy advise?)  This is the patient's preferred pharmacy:  [facility] [id number] - [place], [place] - [address] [place] [address] Phone: [phone] Fax: [phone]    Is this the correct pharmacy for this prescription? Yes If no, delete pharmacy and type the correct one.   Has the prescription been filled recently? No  Is the patient out of the medication? Yes  Has the patient been seen for an appointment in the last year OR does the patient have an upcoming appointment? Yes  Can we respond through MyChart? Yes  Agent: Please be advised that Rx refills may take up to 3 business days. We ask that you follow-up with your pharmacy.

## 2024-07-08 NOTE — Progress Notes (Signed)
 " Electrophysiology Office Note:   Date:  07/12/2024  ID:  [name] [name], DOB 12-Jan-1946, MRN [id number]  Primary Cardiologist: Peter Jordan, MD Electrophysiologist: Fonda Kitty, MD      History of Present Illness:   [name] [name] is a 79 y.o. male with h/o hypertension, CKD stage IV, chronic systolic heart failure, persistent atrial fibrillation/flutter who is being seen today for follow up.  Discussed the use of AI scribe software for clinical note transcription with the patient, who gave verbal consent to proceed.  History of Present Illness [name] [name] is a 79 year old male with atrial fibrillation and flutter who presents for management of his arrhythmia. He is accompanied by his wife.  He has been experiencing atrial fibrillation (AFib) and atrial flutter, leading to symptoms similar to heart failure. His medication, amiodarone , has been adjusted multiple times. Initially, the dose was increased, then decreased to 100 mg, and later increased again due to more AFib and flutter episodes.  In April, after a canceled surgery, his amiodarone  dose was increased. In October, the dose was decreased to manage side effects, which resulted in more AFib and flutter episodes. The dose was subsequently increased again, and he underwent a second cardioversion after experiencing AFib. Since the most recent cardioversion three weeks ago, he has maintained normal rhythm on a higher dose of amiodarone .  He has had two cardioversions, with the most recent one followed by a call to EMS due to concerns about his condition, although he did not go to the hospital. His AFib episodes have been intermittent, with a significant episode occurring six months after a hospital visit in May.  He is currently on a higher dose of amiodarone , which has been effective in maintaining normal rhythm for the past three weeks.  He has an upcoming appointment with a pulmonologist due to previously noted lymph node  swelling, which was not addressed earlier due to a scheduled surgery.    Review of systems complete and found to be negative unless listed in HPI.   EP Information / Studies Reviewed:    EKG is ordered today. Personal review as below.  EKG Interpretation Date/Time:  Tuesday July 09 2024 09:42:57 EST Ventricular Rate:  63 PR Interval:  220 QRS Duration:  114 QT Interval:  482 QTC Calculation: 493 R Axis:   122  Text Interpretation: Sinus rhythm with 1st degree A-V block with Premature atrial complexes with Abberant conduction Anterolateral infarct (cited on or before 19-Jun-2024) When compared with ECG of 19-Jun-2024 13:28, QRS axis Shifted right T wave inversion now evident in Inferior leads Nonspecific T wave abnormality, improved in Lateral leads Confirmed by Kitty Fonda [phone]) on 07/09/2024 9:51:20 AM   Echo 06/03/24:   1. Left ventricular ejection fraction, by estimation, is 30 to 35%. The  left ventricle has moderately decreased function. The left ventricle  demonstrates global hypokinesis. There is moderate concentric left  ventricular hypertrophy. Left ventricular  diastolic parameters are indeterminate.   2. Right ventricular systolic function is normal. The right ventricular  size is normal. Mildly increased right ventricular wall thickness.   3. Left atrial size was severely dilated.   4. Right atrial size was severely dilated.   5. A small pericardial effusion is present. The pericardial effusion is  circumferential.   6. The mitral valve is normal in structure. No evidence of mitral valve  regurgitation. No evidence of mitral stenosis.   7. The aortic valve is normal in structure. There is moderate thickening  of the aortic valve. Aortic valve regurgitation is not visualized. Aortic  valve sclerosis/calcification is present, without any evidence of aortic  stenosis.   8. Aortic dilatation noted. There is borderline dilatation of the  ascending aorta, measuring  39 mm.   9. The inferior vena cava is normal in size with greater than 50%  respiratory variability, suggesting right atrial pressure of 3 mmHg.   Risk Assessment/Calculations:    CHA2DS2-VASc Score = 8   This indicates a 10.8% annual risk of stroke. The patient's score is based upon: CHF History: 1 HTN History: 1 Diabetes History: 1 Stroke History: 2 Vascular Disease History: 1 Age Score: 2 Gender Score: 0         Physical Exam:   VS:  BP (!) 140/70 (BP Location: Left Arm, Patient Position: Sitting, Cuff Size: Large)   Pulse 63   Ht 6' 3 (1.905 m)   Wt 215 lb 14.4 oz (97.9 kg)   SpO2 97%   BMI 26.99 kg/m    Wt Readings from Last 3 Encounters:  07/09/24 215 lb 14.4 oz (97.9 kg)  06/19/24 217 lb (98.4 kg)  06/18/24 219 lb (99.3 kg)     GEN: Well nourished, well developed in no acute distress NECK: No JVD CARDIAC: Normal rate, regular RESPIRATORY:  Clear to auscultation without rales, wheezing or rhonchi  ABDOMEN: Soft, non-distended EXTREMITIES:  No edema; No deformity   ASSESSMENT AND PLAN:    #Persistent atrial fibrillation:  #Atrial flutter: Atypical. #High risk medication use: Amiodarone . -Management of his arrhythmias is a difficult situation for Mr. Twombly.  Given patient's heart failure, rhythm control strategy has been prioritized.  Since increasing his amiodarone  back to 200 mg daily, he has maintained sinus after his last cardioversion.  Unfortunately, would avoid catheter ablation at this time given elevated risk of kidney injury with PFA.  Radiofrequency ablation could be considered, but this would require more IV fluids, which could decompensate his heart failure.  For now, prefer to leave on amiodarone  200 mg once daily with close monitoring.  If he were to have recurrence then we could readdress risk and benefits of catheter ablation.  Alternatively, pacemaker and AV node ablation could be considered, but would like to see stability in his creatinine as  progression to hemodialysis with an indwelling device would not be desirable.  #Hypercoagulable state due to AF/AFL: - Continue Xarelto .  #Chronic systolic heart failure: Well compensated on exam today. -Rhythm control strategy as above. -Continue current management and follow-up with general cardiology. - Will reassess ICD/PPM in 2 to 3 months.  #Chronic kidney disease, stage IV: Concern for potential worsening post-ablation.  Additionally, concerned that if we pursued CRT and AV node ablation that if he progressed to hemodialysis there would be elevated infectious risk. - Will reassess kidney function at follow-up in two months.   Follow up with Dr. Kennyth in 2 months.   Signed, Fonda Kennyth, MD  "

## 2024-07-09 ENCOUNTER — Encounter: Payer: Self-pay | Admitting: Cardiology

## 2024-07-09 ENCOUNTER — Ambulatory Visit: Attending: Cardiology | Admitting: Cardiology

## 2024-07-09 VITALS — BP 140/70 | HR 63 | Ht 75.0 in | Wt 215.9 lb

## 2024-07-09 DIAGNOSIS — I5022 Chronic systolic (congestive) heart failure: Secondary | ICD-10-CM | POA: Diagnosis not present

## 2024-07-09 DIAGNOSIS — D6869 Other thrombophilia: Secondary | ICD-10-CM

## 2024-07-09 DIAGNOSIS — Z79899 Other long term (current) drug therapy: Secondary | ICD-10-CM

## 2024-07-09 DIAGNOSIS — I4819 Other persistent atrial fibrillation: Secondary | ICD-10-CM | POA: Diagnosis not present

## 2024-07-09 DIAGNOSIS — N184 Chronic kidney disease, stage 4 (severe): Secondary | ICD-10-CM | POA: Diagnosis not present

## 2024-07-09 DIAGNOSIS — I484 Atypical atrial flutter: Secondary | ICD-10-CM | POA: Diagnosis not present

## 2024-07-09 NOTE — Patient Instructions (Signed)
 Medication Instructions:  Your physician recommends that you continue on your current medications as directed. Please refer to the Current Medication list given to you today.  *If you need a refill on your cardiac medications before your next appointment, please call your pharmacy* Follow-Up: At [facility], you and your health needs are our priority.  As part of our continuing mission to provide you with exceptional heart care, our providers are all part of one team.  This team includes your primary Cardiologist (physician) and Advanced Practice Providers or APPs (Physician Assistants and Nurse Practitioners) who all work together to provide you with the care you need, when you need it.  Your next appointment:   8 weeks  Provider:   Fonda Kitty, MD

## 2024-07-10 ENCOUNTER — Ambulatory Visit
Admission: RE | Admit: 2024-07-10 | Discharge: 2024-07-10 | Disposition: A | Discharge status: Home / Self Care | Source: Ambulatory Visit | Attending: Emergency Medicine | Admitting: Emergency Medicine

## 2024-07-10 DIAGNOSIS — R590 Localized enlarged lymph nodes: Secondary | ICD-10-CM

## 2024-07-11 ENCOUNTER — Encounter: Payer: Self-pay | Admitting: Cardiology

## 2024-07-17 ENCOUNTER — Encounter: Payer: Self-pay | Admitting: Emergency Medicine

## 2024-07-17 ENCOUNTER — Ambulatory Visit: Admitting: Emergency Medicine

## 2024-07-17 VITALS — BP 159/82 | HR 56 | Temp 98.5°F | Ht 75.0 in | Wt 214.0 lb

## 2024-07-17 DIAGNOSIS — R590 Localized enlarged lymph nodes: Secondary | ICD-10-CM

## 2024-07-17 NOTE — Patient Instructions (Signed)
 We reviewed your CT scan of the chest today.  This is stable compared with your priors.  Good news. We talked about the pros and cons of possible bronchoscopy to biopsy enlarged lymph nodes in the chest.  We decided to defer for now We will repeat your CT scan of the chest in July 2026 Follow Dr. Shelah in July after your CT so we can review those results together.

## 2024-07-17 NOTE — Progress Notes (Signed)
" ° °  Subjective:    Patient ID: [name] [name], male    DOB: [birth year], 79 y.o.   MRN: [id number]  Shortness of Breath   ROV 07/17/2024 --79 year old gentleman with atrial fibrillation/cardioversion, chronic kidney disease, diabetes, hypertension with diastolic dysfunction, history of CVA.  He is a never smoker.  I saw him for mediastinal adenopathy noted on chest imaging.  He had a PET scan 01/2024 that did not show any associated hypermetabolism.  We talked about possible biopsy but in the end decided to perform serial imaging.  He also deals with chronic cough. He feels pretty well but does deal w some chronic fatigue. He feels weak, sleeps / naps a lot. He would very much like to avoid any invasive testing.   CT chest 07/10/2024 reviewed by me, shows no evidence of any infiltrates, mass, consolidation.  He continues to have multiple enlarged mediastinal nodes that are grossly stable compared with his prior study.   Review of Systems  Respiratory:  Positive for shortness of breath.    As per HPI     Objective:    Vitals:   07/17/24 1018  BP: (!) 159/82  Pulse: (!) 56  Temp: 98.5 F (36.9 C)  TempSrc: Oral  SpO2: 99%  Weight: 214 lb (97.1 kg)  Height: 6' 3 (1.905 m)   Physical Exam Gen: Pleasant, well-nourished, in no distress, somewhat stoic but normal affect  ENT: No lesions,  mouth clear,  oropharynx clear, no postnasal drip  Neck: No JVD, no stridor  Lungs: No use of accessory muscles, no crackles or wheezing on normal respiration, no wheeze on forced expiration  Cardiovascular: RRR, heart sounds normal, no murmur or gallops, trace peripheral edema  Musculoskeletal: No deformities, no cyanosis or clubbing  Neuro: alert, awake, non focal  Skin: Warm, no lesions or rashes       Assessment & Plan:   Assessment & Plan Mediastinal adenopathy Unclear etiology but we do have reassuring information including a negative PET scan, stability on his most recent scan  going back to at least March 2025.  Discussed the pros and cons of bronchoscopy for tissue diagnosis.  There is nothing pushing this to get tissue at this time given the overall stability.  He would like to avoid invasive procedures if possible.  Will continue to follow closely.  I will repeat his CT chest in July and then follow-up to review.   Return in about 6 months (around 01/14/2025) for With Dr. Shelah, To review CT scan of the chest.  I personally spent a total of 23 minutes in the care of the patient today including preparing to see the patient, getting/reviewing separately obtained history, performing a medically appropriate exam/evaluation, counseling and educating, placing orders, documenting clinical information in the EHR, independently interpreting results, and communicating results.   Lamar Shelah, MD, PhD 07/17/2024, 10:40 AM [facility] [phone] or if no answer before 7:00PM call [phone] For any issues after 7:00PM please call eLink [phone]  "

## 2024-07-17 NOTE — Assessment & Plan Note (Addendum)
 Unclear etiology but we do have reassuring information including a negative PET scan, stability on his most recent scan going back to at least March 2025.  Discussed the pros and cons of bronchoscopy for tissue diagnosis.  There is nothing pushing this to get tissue at this time given the overall stability.  He would like to avoid invasive procedures if possible.  Will continue to follow closely.  I will repeat his CT chest in July and then follow-up to review.

## 2024-07-25 ENCOUNTER — Other Ambulatory Visit (HOSPITAL_COMMUNITY): Payer: Self-pay | Admitting: Internal Medicine

## 2024-07-27 ENCOUNTER — Other Ambulatory Visit: Payer: Self-pay

## 2024-07-27 ENCOUNTER — Encounter: Payer: Self-pay | Admitting: Hematology

## 2024-07-27 ENCOUNTER — Other Ambulatory Visit (HOSPITAL_COMMUNITY): Payer: Self-pay

## 2024-07-27 ENCOUNTER — Other Ambulatory Visit: Payer: Self-pay | Admitting: Physician Assistant

## 2024-07-27 MED ORDER — RIVAROXABAN 15 MG PO TABS
15.0000 mg | ORAL_TABLET | Freq: Every day | ORAL | 5 refills | Status: AC
  Filled 2024-07-27: qty 30, 30d supply, fill #0

## 2024-07-27 NOTE — Progress Notes (Signed)
" ° °  Patient paged that Xarelto  was due for refill but preferred pharmacy at [facility] was out of meds and not available at any other pharmacy. Patient confirmed that [facility] has medication in stock. Will send prescription for Xarelto  refill.   Signed, Lorette CINDERELLA Kapur, PA-C 07/27/2024, 8:38 AM Pager: [phone]  "

## 2024-08-30 ENCOUNTER — Ambulatory Visit: Payer: Medicare Other

## 2024-09-17 ENCOUNTER — Ambulatory Visit: Admitting: Cardiology

## 2025-01-06 ENCOUNTER — Other Ambulatory Visit
